# Patient Record
Sex: Female | Born: 1985 | Race: White | Hispanic: No | Smoking: Never smoker
Health system: Southern US, Community
[De-identification: ages and names within clinical notes are randomized; demographics above are authoritative.]

## PROBLEM LIST (undated history)

## (undated) DIAGNOSIS — R0602 Shortness of breath: Secondary | ICD-10-CM

## (undated) DIAGNOSIS — E785 Hyperlipidemia, unspecified: Secondary | ICD-10-CM

## (undated) DIAGNOSIS — J45909 Unspecified asthma, uncomplicated: Secondary | ICD-10-CM

## (undated) DIAGNOSIS — M1990 Unspecified osteoarthritis, unspecified site: Secondary | ICD-10-CM

## (undated) DIAGNOSIS — K219 Gastro-esophageal reflux disease without esophagitis: Secondary | ICD-10-CM

## (undated) DIAGNOSIS — T7840XA Allergy, unspecified, initial encounter: Secondary | ICD-10-CM

## (undated) DIAGNOSIS — IMO0002 Reserved for concepts with insufficient information to code with codable children: Secondary | ICD-10-CM

## (undated) DIAGNOSIS — G709 Myoneural disorder, unspecified: Secondary | ICD-10-CM

## (undated) DIAGNOSIS — E079 Disorder of thyroid, unspecified: Secondary | ICD-10-CM

## (undated) DIAGNOSIS — F329 Major depressive disorder, single episode, unspecified: Secondary | ICD-10-CM

## (undated) DIAGNOSIS — K589 Irritable bowel syndrome without diarrhea: Secondary | ICD-10-CM

## (undated) DIAGNOSIS — M329 Systemic lupus erythematosus, unspecified: Secondary | ICD-10-CM

## (undated) DIAGNOSIS — R569 Unspecified convulsions: Secondary | ICD-10-CM

## (undated) DIAGNOSIS — M810 Age-related osteoporosis without current pathological fracture: Secondary | ICD-10-CM

## (undated) DIAGNOSIS — J42 Unspecified chronic bronchitis: Secondary | ICD-10-CM

## (undated) HISTORY — DX: Age-related osteoporosis without current pathological fracture: M81.0

## (undated) HISTORY — DX: Myoneural disorder, unspecified: G70.9

## (undated) HISTORY — DX: Irritable bowel syndrome without diarrhea: K58.9

## (undated) HISTORY — DX: Reserved for concepts with insufficient information to code with codable children: IMO0002

## (undated) HISTORY — DX: Hyperlipidemia, unspecified: E78.5

## (undated) HISTORY — DX: Allergy, unspecified, initial encounter: T78.40XA

## (undated) HISTORY — DX: Unspecified convulsions: R56.9

## (undated) HISTORY — DX: Major depressive disorder, single episode, unspecified: F32.9

## (undated) HISTORY — PX: RECTAL SURGERY: SHX760

## (undated) HISTORY — DX: Systemic lupus erythematosus, unspecified: M32.9

## (undated) HISTORY — DX: Unspecified osteoarthritis, unspecified site: M19.90

## (undated) HISTORY — PX: COLONOSCOPY: SHX174

## (undated) HISTORY — DX: Disorder of thyroid, unspecified: E07.9

## (undated) HISTORY — DX: Gastro-esophageal reflux disease without esophagitis: K21.9

## (undated) HISTORY — DX: Unspecified chronic bronchitis: J42

## (undated) HISTORY — DX: Shortness of breath: R06.02

## (undated) HISTORY — DX: Unspecified asthma, uncomplicated: J45.909

## (undated) HISTORY — PX: KNEE SURGERY: SHX244

---
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
## 2003-08-07 ENCOUNTER — Emergency Department (HOSPITAL_COMMUNITY): Admission: EM | Admit: 2003-08-07 | Discharge: 2003-08-07 | Payer: Self-pay | Admitting: Emergency Medicine

## 2003-08-07 ENCOUNTER — Encounter: Payer: Self-pay | Admitting: Emergency Medicine

## 2003-08-24 ENCOUNTER — Other Ambulatory Visit: Admission: RE | Admit: 2003-08-24 | Discharge: 2003-08-24 | Payer: Self-pay | Admitting: Family Medicine

## 2004-08-21 ENCOUNTER — Other Ambulatory Visit: Admission: RE | Admit: 2004-08-21 | Discharge: 2004-08-21 | Payer: Self-pay | Admitting: Obstetrics and Gynecology

## 2005-09-05 ENCOUNTER — Other Ambulatory Visit: Admission: RE | Admit: 2005-09-05 | Discharge: 2005-09-05 | Payer: Self-pay | Admitting: Obstetrics and Gynecology

## 2006-11-13 ENCOUNTER — Other Ambulatory Visit: Admission: RE | Admit: 2006-11-13 | Discharge: 2006-11-13 | Payer: Self-pay | Admitting: Obstetrics & Gynecology

## 2006-12-01 ENCOUNTER — Encounter: Admission: RE | Admit: 2006-12-01 | Discharge: 2006-12-01 | Payer: Self-pay | Admitting: Gastroenterology

## 2006-12-14 ENCOUNTER — Ambulatory Visit (HOSPITAL_COMMUNITY): Admission: RE | Admit: 2006-12-14 | Discharge: 2006-12-14 | Payer: Self-pay | Admitting: Gastroenterology

## 2006-12-14 ENCOUNTER — Encounter (INDEPENDENT_AMBULATORY_CARE_PROVIDER_SITE_OTHER): Payer: Self-pay | Admitting: Specialist

## 2007-11-16 ENCOUNTER — Other Ambulatory Visit: Admission: RE | Admit: 2007-11-16 | Discharge: 2007-11-16 | Payer: Self-pay | Admitting: Obstetrics and Gynecology

## 2008-07-14 ENCOUNTER — Emergency Department (HOSPITAL_COMMUNITY): Admission: EM | Admit: 2008-07-14 | Discharge: 2008-07-14 | Payer: Self-pay | Admitting: Emergency Medicine

## 2008-12-14 ENCOUNTER — Other Ambulatory Visit: Admission: RE | Admit: 2008-12-14 | Discharge: 2008-12-14 | Payer: Self-pay | Admitting: Obstetrics and Gynecology

## 2009-11-25 ENCOUNTER — Emergency Department (HOSPITAL_COMMUNITY): Admission: EM | Admit: 2009-11-25 | Discharge: 2009-11-25 | Payer: Self-pay | Admitting: Emergency Medicine

## 2009-12-17 ENCOUNTER — Encounter: Admission: RE | Admit: 2009-12-17 | Discharge: 2009-12-17 | Payer: Self-pay | Admitting: Surgery

## 2010-01-18 ENCOUNTER — Ambulatory Visit (HOSPITAL_COMMUNITY): Admission: RE | Admit: 2010-01-18 | Discharge: 2010-01-19 | Payer: Self-pay | Admitting: Surgery

## 2011-02-02 LAB — URINALYSIS, ROUTINE W REFLEX MICROSCOPIC
Bilirubin Urine: NEGATIVE
Glucose, UA: NEGATIVE mg/dL
Hgb urine dipstick: NEGATIVE
Ketones, ur: NEGATIVE mg/dL
Nitrite: NEGATIVE
Protein, ur: NEGATIVE mg/dL
Specific Gravity, Urine: 1.013 (ref 1.005–1.030)
Urobilinogen, UA: 0.2 mg/dL (ref 0.0–1.0)
pH: 6 (ref 5.0–8.0)

## 2011-02-02 LAB — COMPREHENSIVE METABOLIC PANEL
ALT: 19 U/L (ref 0–35)
Alkaline Phosphatase: 91 U/L (ref 39–117)
BUN: 6 mg/dL (ref 6–23)
CO2: 25 mEq/L (ref 19–32)
Chloride: 104 mEq/L (ref 96–112)
Glucose, Bld: 92 mg/dL (ref 70–99)
Potassium: 3.4 mEq/L — ABNORMAL LOW (ref 3.5–5.1)
Sodium: 136 mEq/L (ref 135–145)
Total Bilirubin: 0.4 mg/dL (ref 0.3–1.2)
Total Protein: 6.7 g/dL (ref 6.0–8.3)

## 2011-02-02 LAB — DIFFERENTIAL
Basophils Absolute: 0.1 10*3/uL (ref 0.0–0.1)
Basophils Relative: 1 % (ref 0–1)
Eosinophils Absolute: 0.1 10*3/uL (ref 0.0–0.7)
Monocytes Relative: 7 % (ref 3–12)
Neutro Abs: 6.6 10*3/uL (ref 1.7–7.7)
Neutrophils Relative %: 56 % (ref 43–77)

## 2011-02-02 LAB — POCT PREGNANCY, URINE: Preg Test, Ur: NEGATIVE

## 2011-02-02 LAB — CBC
HCT: 38.9 % (ref 36.0–46.0)
Hemoglobin: 13.1 g/dL (ref 12.0–15.0)
RBC: 4.34 MIL/uL (ref 3.87–5.11)
RDW: 12.9 % (ref 11.5–15.5)
WBC: 11.6 10*3/uL — ABNORMAL HIGH (ref 4.0–10.5)

## 2011-02-02 LAB — HEMOCCULT GUIAC POC 1CARD (OFFICE): Fecal Occult Bld: NEGATIVE

## 2011-02-10 LAB — PREGNANCY, URINE: Preg Test, Ur: NEGATIVE

## 2011-04-04 NOTE — Op Note (Signed)
NAME:  Autumn Davis, SECKINGER               ACCOUNT NO.:  [id number]   MEDICAL RECORD NO.:  [id number]          PATIENT TYPE:  AMB   LOCATION:  ENDO                         FACILITY:  [facility]   PHYSICIAN:  Anselmo Rod, M.D.  DATE OF BIRTH:  05-Dec-1985   DATE OF PROCEDURE:  12/14/2006  DATE OF DISCHARGE:                               OPERATIVE REPORT   PROCEDURE PERFORMED:  Colonoscopy with snare polypectomy x1.   ENDOSCOPIST:  Anselmo Rod, M.D.   INSTRUMENT USED:  Pentax video colonoscope.   INDICATIONS FOR PROCEDURE:  A 25 year old female with a history of  rectal bleeding and mucoid stool, family history of ulcerative colitis.  The patient has a history of change in bowel habits recently.  Rule out  inflammatory bowel disease.   PREPROCEDURE PREPARATION:  Informed consent was procured from the  patient.  The patient was fasted for eight hours prior to the procedure  and prepped with a gallon of Trilyte the night prior to the procedure.  The risks and benefits of the procedure including a 10% miss rate for  cancer or polyps was discussed with the patient as well.   PREPROCEDURE PHYSICAL:  The patient had stable vital signs.  Neck  supple.  Chest clear to auscultation.  S1 and S2 regular.  Abdomen soft  with normal bowel sounds.   DESCRIPTION OF PROCEDURE:  The patient was placed in left lateral  decubitus position and sedated with 75 mcg of fentanyl and 7.5 mg of  Versed given intravenously in slow incremental doses.  Once the patient  was adequately sedated and maintained on low flow oxygen and continuous  cardiac monitoring, the Olympus video colonoscope was advanced from the  rectum.  The appendicular orifice and ileocecal valve were clearly  visualized and photographed.  The entire colonic mucosa appeared healthy  with normal vascular pattern.  Prominent lymphoid follicles were noted  on intubation in the terminal ileum.  Retroflexion in the rectum  revealed small internal  hemorrhoids.  The patient tolerated the  procedure well without complication.   IMPRESSION:  1. Small nonbleeding internal hemorrhoids.  2. Small sessile polyp removed by cold snare from the rectum.  3. Otherwise normal exam up to the terminal ileum.   RECOMMENDATIONS:  1. Await pathology results.  2. Avoid all nonsteroidals including aspirin for now.  3. Continue high fiber diet with liberal fluid intake.  4. Outpatient followup in the next two weeks for further      recommendations.      Anselmo Rod, M.D.  Electronically Signed     JNM/MEDQ  D:  12/14/2006  T:  12/14/2006  Job:  [id number]   cc:   Edwena Felty. Romine, M.D.

## 2011-12-18 ENCOUNTER — Emergency Department (HOSPITAL_COMMUNITY): Payer: BC Managed Care – PPO

## 2011-12-18 ENCOUNTER — Emergency Department (HOSPITAL_COMMUNITY)
Admission: EM | Admit: 2011-12-18 | Discharge: 2011-12-18 | Disposition: A | Discharge status: Home / Self Care | Payer: BC Managed Care – PPO | Attending: Emergency Medicine | Admitting: Emergency Medicine

## 2011-12-18 DIAGNOSIS — R10819 Abdominal tenderness, unspecified site: Secondary | ICD-10-CM | POA: Insufficient documentation

## 2011-12-18 DIAGNOSIS — G8929 Other chronic pain: Secondary | ICD-10-CM | POA: Insufficient documentation

## 2011-12-18 DIAGNOSIS — R109 Unspecified abdominal pain: Secondary | ICD-10-CM | POA: Insufficient documentation

## 2011-12-18 DIAGNOSIS — R1900 Intra-abdominal and pelvic swelling, mass and lump, unspecified site: Secondary | ICD-10-CM | POA: Insufficient documentation

## 2011-12-18 LAB — COMPREHENSIVE METABOLIC PANEL
ALT: 15 U/L (ref 0–35)
AST: 15 U/L (ref 0–37)
Albumin: 3.8 g/dL (ref 3.5–5.2)
Calcium: 9.5 mg/dL (ref 8.4–10.5)
Creatinine, Ser: 0.82 mg/dL (ref 0.50–1.10)
Sodium: 140 mEq/L (ref 135–145)

## 2011-12-18 LAB — WET PREP, GENITAL
Trich, Wet Prep: NONE SEEN
Yeast Wet Prep HPF POC: NONE SEEN

## 2011-12-18 LAB — DIFFERENTIAL
Basophils Absolute: 0.1 10*3/uL (ref 0.0–0.1)
Basophils Relative: 2 % — ABNORMAL HIGH (ref 0–1)
Eosinophils Relative: 1 % (ref 0–5)
Monocytes Absolute: 0.6 10*3/uL (ref 0.1–1.0)
Neutro Abs: 3.7 10*3/uL (ref 1.7–7.7)

## 2011-12-18 LAB — CBC
HCT: 37 % (ref 36.0–46.0)
MCHC: 33.8 g/dL (ref 30.0–36.0)
MCV: 89.4 fL (ref 78.0–100.0)
Platelets: 365 10*3/uL (ref 150–400)
RDW: 13 % (ref 11.5–15.5)

## 2011-12-18 LAB — URINALYSIS, ROUTINE W REFLEX MICROSCOPIC
Glucose, UA: NEGATIVE mg/dL
Leukocytes, UA: NEGATIVE
Specific Gravity, Urine: 1.016 (ref 1.005–1.030)
pH: 7 (ref 5.0–8.0)

## 2011-12-18 LAB — OCCULT BLOOD, POC DEVICE: Fecal Occult Bld: NEGATIVE

## 2011-12-18 MED ORDER — HYDROCODONE-ACETAMINOPHEN 5-325 MG PO TABS: 1.0000 | ORAL_TABLET | ORAL | Status: AC | PRN

## 2011-12-18 NOTE — ED Notes (Signed)
Ambulated to the BR with steady gait

## 2011-12-18 NOTE — ED Provider Notes (Signed)
History     CSN: [id number]  Arrival date & time 12/18/11  4098   First MD Initiated Contact with Patient 12/18/11 1001      Chief Complaint  Patient presents with  . Abdominal Pain    abdominal bloating    (Consider location/radiation/quality/duration/timing/severity/associated sxs/prior treatment) HPI History provided by pt.   Pt has had abdominal pain w/ radiation to the back and gradually worsening bloating for greater than one month.   No associated fever, N/V or GU sx w/ exception of increased urinary frequency.  Has had intermittent diarrhea but has h/o IBS and these sx are unchanged.  Also has h/o rectal prolapse and colon polyps; most recent colonoscopy 2 yrs ago. Has noticed blood on tp when she wipes recently.  Pt has been evaluated by her PCP for these complaints and labs showed a cholesterol abnormality that could be causing her to retain fluid.  Was started on Crestor and referred to ER for CT.  Seen at [facility] ER last Thursday, chart has been reviewed and she had a neg CT abd/pelvis.  Followed up with Dr. Loreta Ave (GI), told that sx were not likely to be GI and was referred to Neurology.  The neurologist does not believe her symptoms are neurologic.  Pt now has edema in her hands. Had to remove her rings.  Eats a well rounded diet that includes protein.   No past medical history on file.  No past surgical history on file.  No family history on file.  History  Substance Use Topics  . Smoking status: Not on file  . Smokeless tobacco: Not on file  . Alcohol Use: Not on file    OB History    No data available      Review of Systems  All other systems reviewed and are negative.    Allergies  Compazine  Home Medications   Current Outpatient Rx  Name Route Sig Dispense Refill  . FENOFIBRATE MICRONIZED 134 MG PO CAPS Oral Take 134 mg by mouth daily before breakfast.    . LEVONORGESTREL-ETHINYL ESTRAD 0.1-20 MG-MCG PO TABS Oral Take 1 tablet by mouth daily.    Marland Kitchen  MONTELUKAST SODIUM 10 MG PO TABS Oral Take 10 mg by mouth at bedtime.    Marland Kitchen RIFAXIMIN 550 MG PO TABS Oral Take 550 mg by mouth 2 (two) times daily.    Marland Kitchen ROSUVASTATIN CALCIUM 10 MG PO TABS Oral Take 10 mg by mouth daily.    Marland Kitchen SACCHAROMYCES BOULARDII 250 MG PO CAPS Oral Take 500 mg by mouth 2 (two) times daily.    . SERTRALINE HCL 100 MG PO TABS Oral Take 100 mg by mouth daily.    . TOPIRAMATE 200 MG PO TABS Oral Take 200 mg by mouth daily.      BP 110/74  Pulse 77  Temp(Src) 98.8 F (37.1 C) (Oral)  Resp 16  Ht 5\' 9"  (1.753 m)  Wt 168 lb (76.204 kg)  BMI 24.81 kg/m2  SpO2 100%  LMP 11/27/2011  Physical Exam  Nursing note and vitals reviewed. Constitutional: She is oriented to person, place, and time. She appears well-developed and well-nourished. No distress.  HENT:  Head: Normocephalic and atraumatic.  Eyes:       Normal appearance  Neck: Normal range of motion.  Cardiovascular: Normal rate and regular rhythm.   Pulmonary/Chest: Effort normal and breath sounds normal.  Abdominal: Soft. Bowel sounds are normal. She exhibits no mass. There is no rebound and no guarding.  Abd distended but soft.  Diffuse, mild ttp.  No CVA tenderness.  Genitourinary:       Nml external genitalia.  No vaginal discharge/bleeding.  Cervix closed and appears nml. No cervical motion tenderess.  Bilateral adnexal tenderness.  Nml rectal tone.  No fissures or external hemorrhoids.  Nml stool color and no gross blood in rectum.  Non-tender.    Musculoskeletal:       No obvious edema of extremities but pt reports that her hands are more swollen than normal.  Neurological: She is alert and oriented to person, place, and time.  Skin: Skin is warm and dry. No rash noted.  Psychiatric: She has a normal mood and affect. Her behavior is normal.    ED Course  Procedures (including critical care time)  Labs Reviewed  DIFFERENTIAL - Abnormal; Notable for the following:    Basophils Relative 2 (*)    All  other components within normal limits  WET PREP, GENITAL - Abnormal; Notable for the following:    Clue Cells Wet Prep HPF POC FEW (*)    WBC, Wet Prep HPF POC RARE (*)    All other components within normal limits  CBC  COMPREHENSIVE METABOLIC PANEL  LIPASE, BLOOD  URINALYSIS, ROUTINE W REFLEX MICROSCOPIC  POCT PREGNANCY, URINE  OCCULT BLOOD, POC DEVICE  GC/CHLAMYDIA PROBE AMP, GENITAL  OCCULT BLOOD X 1 CARD TO LAB, STOOL   US Transvaginal Non-ob  12/18/2011  *RADIOLOGY REPORT*  Clinical Data: Abdominal swelling and pain.  TRANSABDOMINAL AND TRANSVAGINAL ULTRASOUND OF PELVIS Technique:  Both transabdominal and transvaginal ultrasound examinations of the pelvis were performed. Transabdominal technique was performed for global imaging of the pelvis including uterus, ovaries, adnexal regions, and pelvic cul-de-sac.  Comparison: None.   It was necessary to proceed with endovaginal exam following the transabdominal exam to visualize the uterus, ovaries, and adnexa  .  Findings:  Uterus: Normal in size and appearance  Endometrium: Normal, 8 mm.  Right ovary:  1.7 x 1.4 x 1.2 cm. Normal in morphology.  Left ovary: 3.5 x 2.5 x 2.1 cm.  Within the left ovary is a primarily simple appearing cystic lesion.  This measures 2.5 x 2.4 x 2.3 cm.  Along its inferior wall is an area of complexity.  This measures approximately 7 mm on image 54.  Color Doppler evaluation was not performed.  Other findings: No free fluid  IMPRESSION: Left ovarian cystic lesion with minimal complexity/irregularity in its wall.  Most likely a complex dominant follicle/cyst.  Consider ultrasound follow-up at 6-12 weeks, in the week following the patient's menses, to confirm resolution. This recommendation follows the consensus statement:  Management of Asymptomatic Ovarian and Other Adnexal Cysts Imaged at [place]:  Society of Radiologists in Ultrasound Consensus Conference Statement. Radiology 2010; [phone].  Available online at:  [email or web address].[email or web address]  Original Report Authenticated By: Consuello Bossier, M.D.   US Pelvis Complete  12/18/2011  *RADIOLOGY REPORT*  Clinical Data: Abdominal swelling and pain.  TRANSABDOMINAL AND TRANSVAGINAL ULTRASOUND OF PELVIS Technique:  Both transabdominal and transvaginal ultrasound examinations of the pelvis were performed. Transabdominal technique was performed for global imaging of the pelvis including uterus, ovaries, adnexal regions, and pelvic cul-de-sac.  Comparison: None.   It was necessary to proceed with endovaginal exam following the transabdominal exam to visualize the uterus, ovaries, and adnexa  .  Findings:  Uterus: Normal in size and appearance  Endometrium: Normal, 8 mm.  Right ovary:  1.7 x 1.4 x 1.2 cm. Normal in morphology.  Left ovary: 3.5 x 2.5 x 2.1 cm.  Within the left ovary is a primarily simple appearing cystic lesion.  This measures 2.5 x 2.4 x 2.3 cm.  Along its inferior wall is an area of complexity.  This measures approximately 7 mm on image 54.  Color Doppler evaluation was not performed.  Other findings: No free fluid  IMPRESSION: Left ovarian cystic lesion with minimal complexity/irregularity in its wall.  Most likely a complex dominant follicle/cyst.  Consider ultrasound follow-up at 6-12 weeks, in the week following the patient's menses, to confirm resolution. This recommendation follows the consensus statement:  Management of Asymptomatic Ovarian and Other Adnexal Cysts Imaged at [place]:  Society of Radiologists in Ultrasound Consensus Conference Statement. Radiology 2010; [phone].  Available online at: [email or web address].[email or web address]/.  Original Report Authenticated By: Consuello Bossier, M.D.     1. Chronic abdominal pain       MDM  Pt presents w/ abdominal pain and distension x >48month as well as hand edema and rectal bleeding.  Has been evaluated by PCP, ER at [facility], GI and neuro and etiology has not been  determined.  Was found to have a cholesterol abnormality and started on Crestor early this week.  CT abd/pelvis negative 12/10/12.  On exam, pt afebrile, NAD, abd distended, soft, diffusely, mildly ttp, no obvious edema of extremities, pelvic exam unremarkable w/ exception of reported bilateral adnexal tenderness, nml rectum. Labs pending.    Labs unremarkable.  Transvaginal US obtained to look for signs of ovarian cancer.  There is a mildly complex and irregular L ovarian cyst.  Results discussed w/ pt.  Recommended close GYN f/u.  She has scheduled an appt from the ED and will be seen this afternoon.  Also recommended that she see her GI specialist if she continues to notice blood in her stool (hemoccult neg today) and with her PCP to have labs followed.  D/c'd home w/ short course of vicodin for pain.  Return precautions discussed.         Arie Sabina [place], [place] 12/18/11 [phone]

## 2011-12-18 NOTE — ED Notes (Signed)
Patient transported to Ultrasound 

## 2011-12-18 NOTE — ED Provider Notes (Signed)
Medical screening examination/treatment/procedure(s) were performed by non-physician practitioner and as supervising physician I was immediately available for consultation/collaboration.   Dayton Bailiff, MD 12/18/11 1415

## 2011-12-18 NOTE — ED Notes (Signed)
[id number]<AV> Expected date:<BR> Expected time:<BR> Means of arrival:<BR> Comments:<BR> Hold for holcomb

## 2011-12-18 NOTE — ED Notes (Signed)
Pt states abdominal bloating for 2 weeks-gained 20 lbs-numbness/tingling of hands/feet

## 2011-12-31 ENCOUNTER — Telehealth (INDEPENDENT_AMBULATORY_CARE_PROVIDER_SITE_OTHER): Payer: Self-pay

## 2011-12-31 NOTE — Telephone Encounter (Signed)
The patient called and requested a soon appointment for possible adhesions s/p rectal prolapse surgery.  She has had thorough workup which I told her we would need the reports.  Please call her to work in sooner that 3/7.  She states she can't work due to constant pain.

## 2012-01-02 NOTE — Telephone Encounter (Signed)
Get on bowel regimen  CT enterrhography if having obstructive Sx.  See if pt has seen her GI MD given her h/o IBS  D/w A. Spillers.

## 2012-01-07 ENCOUNTER — Telehealth (INDEPENDENT_AMBULATORY_CARE_PROVIDER_SITE_OTHER): Payer: Self-pay

## 2012-01-07 NOTE — Telephone Encounter (Signed)
Called pt to notify her that I did receive the message about trying to move her appt up earlier than 01-22-12 but I did explain that I do not have anything at this moment to move her time up. If I do get another clinic with space I will call her back to move the appt. The pt understands.

## 2012-01-22 ENCOUNTER — Encounter (INDEPENDENT_AMBULATORY_CARE_PROVIDER_SITE_OTHER): Payer: Self-pay | Admitting: Surgery

## 2012-01-22 ENCOUNTER — Ambulatory Visit (INDEPENDENT_AMBULATORY_CARE_PROVIDER_SITE_OTHER): Payer: BC Managed Care – PPO | Admitting: Surgery

## 2012-01-22 VITALS — BP 120/76 | HR 98 | Temp 98.0°F | Ht 69.0 in | Wt 170.6 lb

## 2012-01-22 DIAGNOSIS — F32A Depression, unspecified: Secondary | ICD-10-CM

## 2012-01-22 DIAGNOSIS — M545 Low back pain, unspecified: Secondary | ICD-10-CM | POA: Insufficient documentation

## 2012-01-22 DIAGNOSIS — K589 Irritable bowel syndrome without diarrhea: Secondary | ICD-10-CM

## 2012-01-22 DIAGNOSIS — R1084 Generalized abdominal pain: Secondary | ICD-10-CM | POA: Insufficient documentation

## 2012-01-22 DIAGNOSIS — J42 Unspecified chronic bronchitis: Secondary | ICD-10-CM

## 2012-01-22 DIAGNOSIS — K219 Gastro-esophageal reflux disease without esophagitis: Secondary | ICD-10-CM | POA: Insufficient documentation

## 2012-01-22 DIAGNOSIS — F329 Major depressive disorder, single episode, unspecified: Secondary | ICD-10-CM | POA: Insufficient documentation

## 2012-01-22 DIAGNOSIS — E079 Disorder of thyroid, unspecified: Secondary | ICD-10-CM | POA: Insufficient documentation

## 2012-01-22 DIAGNOSIS — R569 Unspecified convulsions: Secondary | ICD-10-CM | POA: Insufficient documentation

## 2012-01-22 HISTORY — DX: Depression, unspecified: F32.A

## 2012-01-22 HISTORY — DX: Unspecified chronic bronchitis: J42

## 2012-01-22 HISTORY — DX: Irritable bowel syndrome, unspecified: K58.9

## 2012-01-22 NOTE — Patient Instructions (Signed)
Managing Pain  Pain after surgery or related to activity is often due to strain/injury to muscle, tendon, nerves and/or incisions.  This pain is usually short-term and will improve in a few months.   Many people find it helpful to do the following things TOGETHER to help speed the process of healing and to get back to regular activity more quickly:  1. Avoid heavy physical activity a.  no lifting greater than 20 pounds b. Do not "push through" the pain.  Listen to your body and avoid positions and maneuvers than reproduce the pain c. Walking is okay as tolerated, but go slowly and stop when getting sore.  d. Remember: If it hurts to do it, then don't do it! 2. Take Anti-inflammatory medication  a. Take with food/snack around the clock for 1-2 weeks i. This helps the muscle and nerve tissues become less irritable and calm down faster b. Choose ONE of the following over-the-counter medications: i. Start with Acetaminophen 500mg  tabs (Tylenol) 2 pills with every meal and just before bedtime (8 pills a day total) ii. Naproxen 220mg  tabs (ex. Aleve) 1-2 pills twice a day (use instead if tylenol not working well after a 2week trial.  Use NSAIDs for a short time only & take PPI antacid (omeprazole = Prilosec)) 3. Use a Heating pad or Ice/Cold Pack a. 4-6 times a day b. May use warm bath/hottub  or showers 4. Try Gentle Massage and/or Stretching  a. at the area of pain many times a day b. stop if you feel pain - do not overdo it  Try these steps together to help you body heal faster and avoid making things get worse.  Doing just one of these things may not be enough.    If you are not getting better after 2-3 weeks or are noticing you are getting worse, contact our office for further advice; we may need to re-evaluate you & see what other things we can do to help.  Muscle Strain A muscle strain, or pulled muscle, occurs when a muscle is over-stretched. A small number of muscle fibers may also be  torn. This is especially common in athletes. This happens when a sudden violent force placed on a muscle pushes it past its capacity. Usually, recovery from a pulled muscle takes 1 to 2 weeks. But complete healing will take 5 to 6 weeks. There are millions of muscle fibers. Following injury, your body will usually return to normal quickly. HOME CARE INSTRUCTIONS   While awake, apply ice to the sore muscle for 15 to 20 minutes each hour for the first 2 days. Put ice in a plastic bag and place a towel between the bag of ice and your skin.   Do not use the pulled muscle for several days. Do not use the muscle if you have pain.   You may wrap the injured area with an elastic bandage for comfort. Be careful not to bind it too tightly. This may interfere with blood circulation.   Only take over-the-counter or prescription medicines for pain, discomfort, or fever as directed by your caregiver. Do not use aspirin as this will increase bleeding (bruising) at injury site.   Warming up before exercise helps prevent muscle strains.  SEEK MEDICAL CARE IF:  There is increased pain or swelling in the affected area. MAKE SURE YOU:   Understand these instructions.   Will watch your condition.   Will get help right away if you are not doing well or get  worse.  Document Released: 11/03/2005 Document Revised: 10/23/2011 Document Reviewed: 06/02/2007 [facility] Patient Information 2012 [place], [place].

## 2012-01-22 NOTE — Progress Notes (Signed)
Subjective:     Patient ID: Autumn Davis, female   DOB: Aug 03, 1986, 26 y.o.   MRN: [id number]  HPI  Autumn Davis  1986/03/10 [id number]  Patient Care Team: [name], FNP as PCP - General (Nurse Practitioner) Charna Elizabeth, MD as Attending Physician (Gastroenterology)  This patient is a 26 y.o.female who presents today for surgical evaluation.  Reason for visit: Abdominal pain. Question adhesions as cause  Patient is a pleasant female with irritable bowel and psych issues. She had a moderate rectal prolapse. Repair this laparoscopically a few years ago. I did not need a colon resection. Since that time, she has been diligent on a good bowel regimen under the care of Dr. Loreta Ave. Her constipation is under much better control. She has about 3-4 bowel movements a day. Fourth.  About 2 months ago, she noted some flank pain when she lied down in bed. It began to radiate to her abdomen. Occasional back pain as well. Occasionally had some lightheadedness and dizziness. Had a recurrent seizure one time but that is better.  The pain seems to be worse at night. She will feel worse after she's been active doing chores. Worse if she has a cough. She had a CAT scan which was normal. She had plain film - showed a moderate stool burden. She had pelvic ultrasound which showed a small left adnexal cystic mass most like be consistent with an ovarian cyst. She notes heat and baths and showers help.  She's not noticed any worsening of symptoms with eating. No problems with spicy or greasy foods. No severe diarrhea or severe bloating after eating. No recurrent symptoms with eating. Energy level down. She has gained about 10 pounds. She has some increased urination but no burning or dysuria. Not really had this before.  She comes today with her mother. No sick contacts. No travel history. No rectal bleeding or melena. She saw gastroenterology. They have been trying to adjust the IBS regimen  without much success. She is back to her usual regimen. Again her bowels are better than they have been a long time.  Patient Active Problem List  Diagnoses  . GERD (gastroesophageal reflux disease)  . Seizures  . Thyroid disease  . IBS (irritable bowel syndrome)  . Depression  . Bronchitis, chronic/intermittent  . Abdominal pain, generalized  . Back pain, lumbosacral    Past Medical History  Diagnosis Date  . GERD (gastroesophageal reflux disease)   . Hyperlipidemia   . Seizures   . Thyroid disease   . Bronchitis, chronic/intermittent 01/22/2012  . Depression 01/22/2012  . IBS (irritable bowel syndrome) 01/22/2012    Past Surgical History  Procedure Date  . Knee surgery 2002/2003    bil    History   Social History  . Marital Status: Married    Spouse Name: N/A    Number of Children: N/A  . Years of Education: N/A   Occupational History  . Not on file.   Social History Main Topics  . Smoking status: Never Smoker   . Smokeless tobacco: Not on file  . Alcohol Use: No  . Drug Use: No  . Sexually Active:    Other Topics Concern  . Not on file   Social History Narrative  . No narrative on file    Family History  Problem Relation Age of Onset  . Thyroid disease Mother   . Cancer Maternal Grandmother     lung  . Cancer Paternal Grandmother  colon/pancreatiec/lymphoma    Current outpatient prescriptions:divalproex (DEPAKOTE) 500 MG DR tablet, Take 500 mg by mouth daily., Disp: , Rfl: ;  fenofibrate micronized (LOFIBRA) 134 MG capsule, Take 134 mg by mouth daily before breakfast., Disp: , Rfl: ;  levonorgestrel-ethinyl estradiol (LUTERA) 0.1-20 MG-MCG tablet, Take 1 tablet by mouth daily., Disp: , Rfl: ;  montelukast (SINGULAIR) 10 MG tablet, Take 10 mg by mouth at bedtime., Disp: , Rfl:  rosuvastatin (CRESTOR) 10 MG tablet, Take 10 mg by mouth daily., Disp: , Rfl: ;  saccharomyces boulardii (FLORASTOR) 250 MG capsule, Take 250 mg by mouth daily., Disp: , Rfl: ;   sertraline (ZOLOFT) 100 MG tablet, Take 100 mg by mouth daily., Disp: , Rfl: ;  topiramate (TOPAMAX) 200 MG tablet, Take 200 mg by mouth daily., Disp: , Rfl:   Allergies  Allergen Reactions  . Compazine Other (See Comments)    hallucinations    BP 120/76  Pulse 98  Temp(Src) 98 F (36.7 C) (Temporal)  Ht 5\' 9"  (1.753 m)  Wt 170 lb 9.6 oz (77.384 kg)  BMI 25.19 kg/m2  SpO2 99%     Review of Systems  Constitutional: Negative for fever, chills, diaphoresis, appetite change and fatigue.  HENT: Negative for ear pain, sore throat, trouble swallowing, neck pain and ear discharge.   Eyes: Negative for photophobia, discharge and visual disturbance.  Respiratory: Negative for cough, choking, chest tightness and shortness of breath.   Cardiovascular: Negative for chest pain and palpitations.  Gastrointestinal: Positive for abdominal pain and abdominal distention. Negative for nausea, vomiting, diarrhea, constipation, blood in stool, anal bleeding and rectal pain.  Genitourinary: Positive for frequency, flank pain and pelvic pain. Negative for dysuria, urgency, hematuria, decreased urine volume, vaginal bleeding, vaginal discharge, enuresis, difficulty urinating, vaginal pain and menstrual problem.  Musculoskeletal: Positive for back pain. Negative for myalgias, joint swelling, arthralgias and gait problem.  Skin: Negative for color change, pallor and rash.  Neurological: Positive for seizures. Negative for dizziness, tremors, syncope, speech difficulty, weakness, numbness and headaches.  Hematological: Negative for adenopathy.  Psychiatric/Behavioral: Negative for confusion and agitation. The patient is not nervous/anxious.        Objective:   Physical Exam  Constitutional: She is oriented to person, place, and time. She appears well-developed and well-nourished. No distress.       Moon face  HENT:  Head: Normocephalic.  Mouth/Throat: Oropharynx is clear and moist. No oropharyngeal  exudate.  Eyes: Conjunctivae and EOM are normal. Pupils are equal, round, and reactive to light. No scleral icterus.  Neck: Normal range of motion. Neck supple. No tracheal deviation present.  Cardiovascular: Normal rate, regular rhythm and intact distal pulses.   Pulmonary/Chest: Effort normal and breath sounds normal. No respiratory distress. She exhibits no tenderness.  Abdominal: Soft. She exhibits no mass. There is no rebound and no guarding. Hernia confirmed negative in the right inguinal area and confirmed negative in the left inguinal area.       Obese but soft.  No real tenderness to exam today.  Easily distracted with talking  Genitourinary: No vaginal discharge found.  Musculoskeletal: Normal range of motion. She exhibits no edema.       Lumbar back: She exhibits tenderness. She exhibits normal range of motion, no bony tenderness, no swelling, no edema, no deformity and no spasm.  Lymphadenopathy:    She has no cervical adenopathy.       Right: No inguinal adenopathy present.       Left: No inguinal  adenopathy present.  Neurological: She is alert and oriented to person, place, and time. No cranial nerve deficit. She exhibits normal muscle tone. Coordination normal.  Skin: Skin is warm and dry. No rash noted. She is not diaphoretic. No erythema.  Psychiatric: She has a normal mood and affect. Her behavior is normal. Judgment and thought content normal.       Assessment:     Bilateral flank/back & now diffuse abd discomfort, seems MSkeletal in nature    Plan:     Given the fact that her symptoms were not reproduced his eating, has good appetite, no diarrhea or severe bloating with eating; I do not think that this is a GI/adhesion issue.  I suspect mostly musculoskeletal. I think she would benefit from a trial of anti-inflammation. Normal I would use Aleve b.i.d. and heat 6 times a day. With her sensitivity issues and Dr. Loreta Ave is hesitant to have her try nonsteroidal, we'll  therefore do Tylenol q.i.d. If that does not pertinent a short course of Aleve for 2-3 weeks.  I would like to physically see the CAT scan films to see if there is any abnormalities  Followup of a left adnexal cystic mass by U/S per PCP. Consider a gynecological evaluation if persists or worsens.  Already on OCPs  She really did not have any adhesions on my case and I did it it laparoscopically. There is no transition point noted on CAT scan. If anything, she was full of stool. Perhaps a more aggressive bowel regimen be helpful. However she is having regular bowel movements and that is much better than it was when I saw her 2 years ago so I hesitate to rock the boat on that.  She and her mother feel reassured.  I asked him to call me once a drop of films to get further feedback.  There is no need for surgery at this time

## 2012-01-24 ENCOUNTER — Encounter (HOSPITAL_COMMUNITY): Payer: Self-pay | Admitting: Emergency Medicine

## 2012-01-24 ENCOUNTER — Emergency Department (HOSPITAL_COMMUNITY)
Admission: EM | Admit: 2012-01-24 | Discharge: 2012-01-25 | Disposition: A | Discharge status: Home / Self Care | Payer: BC Managed Care – PPO | Attending: Emergency Medicine | Admitting: Emergency Medicine

## 2012-01-24 DIAGNOSIS — E876 Hypokalemia: Secondary | ICD-10-CM | POA: Insufficient documentation

## 2012-01-24 DIAGNOSIS — Z79899 Other long term (current) drug therapy: Secondary | ICD-10-CM | POA: Insufficient documentation

## 2012-01-24 DIAGNOSIS — G40909 Epilepsy, unspecified, not intractable, without status epilepticus: Secondary | ICD-10-CM | POA: Insufficient documentation

## 2012-01-24 DIAGNOSIS — R5381 Other malaise: Secondary | ICD-10-CM | POA: Insufficient documentation

## 2012-01-24 DIAGNOSIS — F329 Major depressive disorder, single episode, unspecified: Secondary | ICD-10-CM | POA: Insufficient documentation

## 2012-01-24 DIAGNOSIS — K219 Gastro-esophageal reflux disease without esophagitis: Secondary | ICD-10-CM | POA: Insufficient documentation

## 2012-01-24 DIAGNOSIS — K589 Irritable bowel syndrome without diarrhea: Secondary | ICD-10-CM | POA: Insufficient documentation

## 2012-01-24 DIAGNOSIS — R569 Unspecified convulsions: Secondary | ICD-10-CM

## 2012-01-24 DIAGNOSIS — F3289 Other specified depressive episodes: Secondary | ICD-10-CM | POA: Insufficient documentation

## 2012-01-24 DIAGNOSIS — E785 Hyperlipidemia, unspecified: Secondary | ICD-10-CM | POA: Insufficient documentation

## 2012-01-24 LAB — DIFFERENTIAL
Basophils Absolute: 0.1 10*3/uL (ref 0.0–0.1)
Eosinophils Relative: 1 % (ref 0–5)
Lymphocytes Relative: 52 % — ABNORMAL HIGH (ref 12–46)
Lymphs Abs: 5 10*3/uL — ABNORMAL HIGH (ref 0.7–4.0)
Neutro Abs: 3.7 10*3/uL (ref 1.7–7.7)
Neutrophils Relative %: 39 % — ABNORMAL LOW (ref 43–77)

## 2012-01-24 LAB — CBC
MCV: 90.4 fL (ref 78.0–100.0)
Platelets: 356 10*3/uL (ref 150–400)
RBC: 4.16 MIL/uL (ref 3.87–5.11)
RDW: 12.7 % (ref 11.5–15.5)
WBC: 9.6 10*3/uL (ref 4.0–10.5)

## 2012-01-24 NOTE — ED Notes (Signed)
MOTHER REPORTS GRAND MAL SEIZURE EPISODE AT 6 PM THIS EVENING , DENIES INJURY OR PAIN , STATES FEELS "TIRED AND DRAINED" , RESPIRATIONS UNLABORED.

## 2012-01-25 ENCOUNTER — Encounter (HOSPITAL_COMMUNITY): Payer: Self-pay | Admitting: Radiology

## 2012-01-25 ENCOUNTER — Emergency Department (HOSPITAL_COMMUNITY): Payer: BC Managed Care – PPO

## 2012-01-25 LAB — VALPROIC ACID LEVEL: Valproic Acid Lvl: 36.7 ug/mL — ABNORMAL LOW (ref 50.0–100.0)

## 2012-01-25 LAB — BASIC METABOLIC PANEL
CO2: 21 mEq/L (ref 19–32)
Calcium: 9 mg/dL (ref 8.4–10.5)
Chloride: 108 mEq/L (ref 96–112)
Glucose, Bld: 107 mg/dL — ABNORMAL HIGH (ref 70–99)
Potassium: 3.2 mEq/L — ABNORMAL LOW (ref 3.5–5.1)
Sodium: 140 mEq/L (ref 135–145)

## 2012-01-25 MED ORDER — VALPROATE SODIUM 500 MG/5ML IV SOLN: 500.0000 mg | Freq: Once | INTRAVENOUS | Status: DC

## 2012-01-25 MED ORDER — LORAZEPAM 2 MG/ML IJ SOLN
1.0000 mg | Freq: Once | INTRAMUSCULAR | Status: AC
  Administered 2012-01-25: 1 mg via INTRAVENOUS
  Filled 2012-01-25: qty 1

## 2012-01-25 MED ORDER — POTASSIUM CHLORIDE CRYS ER 20 MEQ PO TBCR: 20.0000 meq | EXTENDED_RELEASE_TABLET | Freq: Every day | ORAL | Status: DC

## 2012-01-25 MED ORDER — POTASSIUM CHLORIDE CRYS ER 20 MEQ PO TBCR
40.0000 meq | EXTENDED_RELEASE_TABLET | Freq: Once | ORAL | Status: AC
Start: 2012-01-25 — End: 2012-01-25
  Administered 2012-01-25: 40 meq via ORAL
  Filled 2012-01-25: qty 2

## 2012-01-25 MED ORDER — VALPROATE SODIUM 500 MG/5ML IV SOLN
500.0000 mg | Freq: Once | INTRAVENOUS | Status: DC
  Administered 2012-01-25: 500 mg via INTRAVENOUS
  Filled 2012-01-25: qty 5

## 2012-01-25 MED ORDER — SODIUM CHLORIDE 0.9 % IV SOLN
Freq: Once | INTRAVENOUS | Status: AC
  Administered 2012-01-25: 01:00:00 via INTRAVENOUS

## 2012-01-25 NOTE — ED Provider Notes (Signed)
History     CSN: [id number]  Arrival date & time 01/24/12  2234   First MD Initiated Contact with Patient 01/25/12 0058      Chief Complaint  Patient presents with  . Seizures    (Consider location/radiation/quality/duration/timing/severity/associated sxs/prior treatment) HPI Comments: Had seizure earlier in the day states has not missed medications no changes in base medical stayus.  This is a new progress of medical condition that is under evalaution since December with evaluation by neurology. Started on Depakote recently has not had level drawn yet   The history is provided by a parent.    Past Medical History  Diagnosis Date  . GERD (gastroesophageal reflux disease)   . Hyperlipidemia   . Seizures   . Thyroid disease   . Bronchitis, chronic/intermittent 01/22/2012  . Depression 01/22/2012  . IBS (irritable bowel syndrome) 01/22/2012  . Seizures     Past Surgical History  Procedure Date  . Knee surgery 2002/2003    bil    Family History  Problem Relation Age of Onset  . Thyroid disease Mother   . Cancer Maternal Grandmother     lung  . Cancer Paternal Grandmother     colon/pancreatiec/lymphoma    History  Substance Use Topics  . Smoking status: Never Smoker   . Smokeless tobacco: Not on file  . Alcohol Use: No    OB History    Grav Para Term Preterm Abortions TAB SAB Ect Mult Living                  Review of Systems  Constitutional: Positive for fatigue.  Neurological: Positive for weakness.    Allergies  Compazine and Tomato  Home Medications   Current Outpatient Rx  Name Route Sig Dispense Refill  . ACETAMINOPHEN 500 MG PO TABS Oral Take 1,000 mg by mouth every 6 (six) hours as needed. For pain    . DIVALPROEX SODIUM 500 MG PO TBEC Oral Take 500 mg by mouth daily.    . FENOFIBRATE MICRONIZED 134 MG PO CAPS Oral Take 134 mg by mouth daily before breakfast.    . LEVONORGESTREL-ETHINYL ESTRAD 0.1-20 MG-MCG PO TABS Oral Take 1 tablet by mouth  daily.    Marland Kitchen MONTELUKAST SODIUM 10 MG PO TABS Oral Take 10 mg by mouth at bedtime.    Marland Kitchen ROSUVASTATIN CALCIUM 10 MG PO TABS Oral Take 10 mg by mouth daily.    Marland Kitchen SACCHAROMYCES BOULARDII 250 MG PO CAPS Oral Take 250 mg by mouth daily.    . SERTRALINE HCL 100 MG PO TABS Oral Take 100 mg by mouth daily.    . TOPIRAMATE 200 MG PO TABS Oral Take 200 mg by mouth daily.      BP 120/65  Pulse 80  Temp(Src) 97.9 F (36.6 C) (Oral)  Resp 16  SpO2 98%  LMP 01/21/2012  Physical Exam  Constitutional: She is oriented to person, place, and time. She appears well-developed and well-nourished.  HENT:  Head: Normocephalic.  Neck: Normal range of motion.  Cardiovascular: Normal rate.   Pulmonary/Chest: Effort normal.  Abdominal: Soft.  Musculoskeletal: Normal range of motion.  Neurological: She is alert and oriented to person, place, and time.  Skin: Skin is warm.    ED Course  Procedures (including critical care time)  Labs Reviewed  DIFFERENTIAL - Abnormal; Notable for the following:    Neutrophils Relative 39 (*)    Lymphocytes Relative 52 (*)    Lymphs Abs 5.0 (*)  All other components within normal limits  BASIC METABOLIC PANEL - Abnormal; Notable for the following:    Potassium 3.2 (*)    Glucose, Bld 107 (*)    All other components within normal limits  VALPROIC ACID LEVEL - Abnormal; Notable for the following:    Valproic Acid Lvl 36.7 (*)    All other components within normal limits  CBC  URINALYSIS, ROUTINE W REFLEX MICROSCOPIC   Ct Head Wo Contrast  01/25/2012  *RADIOLOGY REPORT*  Clinical Data:  Seizure.  CT HEAD WITHOUT CONTRAST  Technique: Contiguous axial images were obtained from the base of the skull through the vertex without contrast.  Comparison: Report dated 08/07/2003.  Findings: Normal appearing cerebral hemispheres and posterior fossa structures.  Normal size and position of the ventricles.  No intracranial hemorrhage, mass lesion or evidence of acute infarction.   Unremarkable bones and included portions of the paranasal sinuses.  IMPRESSION: Normal examination.  Original Report Authenticated By: Darrol Angel, M.D.     1. Seizures   2. Seizure secondary to subtherapeutic anticonvulsant medication   3. Hypokalemia       MDM  Seizure evaluation  after review of her labs she is subtherapeutic and her Depakote as well as having a low potassium supplemented both in the emergency department.  I will recommend that she take a potassium supplement on a regular basis for the next 10 days.  I will send her home with a prescription for 20 mEq, tablets.  I've also recommended that she increase her Depakote to 500 mg twice a day.  She does have an appointment with Dr. Pearlean Brownie on Tuesday.  They can recheck her levels.  At that time        Arman Filter, NP 01/25/12 0156  Arman Filter, NP 01/25/12 0355  Arman Filter, NP 01/25/12 0355  Arman Filter, NP 01/27/12 1831

## 2012-01-25 NOTE — ED Notes (Signed)
The pts depacon has infused.  Pt remains alert no distress.  Family at the bedside

## 2012-01-25 NOTE — ED Notes (Signed)
Seizure precautions initiated-seizure pads placed on bed.

## 2012-01-25 NOTE — ED Notes (Addendum)
Patient complaining of seizure-like activity since December; family member states that seizures have become more frequent during the past two weeks.  Patient reports that seizures have started out as "dizzy spells" (back in December), progressed to absent/staring seizures, and now she is having more body spasm seizures.  Patient has been placed on Depakote by a Neurologist.  Family member states that she has been having 3-4 seizures a day for the last couple of weeks.  Family reports that patient had 7-8 seizures tonight; each lasting less than a minute.  Patient fell to her knees and then to the floor during one of the seizures tonight; hit face on floor.  Family member reports that patient stopped breathing for several seconds, had whole body spasms, and that she started breathing again during this particular seizure.  Upon arrival to room, patient changed into gown.  Patient alert and oriented x4; PERRL present.  Dondra Spry, FNP at bedside.  Will continue to monitor.

## 2012-01-25 NOTE — Discharge Instructions (Signed)
Epilepsy  A seizure (convulsion) is a sudden change in brain function that causes a change in behavior, muscle activity, or ability to remain awake and alert. If a person has recurring seizures, this is called epilepsy.  CAUSES   Epilepsy is a disorder with many possible causes. Anything that disturbs the normal pattern of brain cell activity can lead to seizures. Seizure can be caused from illness to brain damage to abnormal brain development. Epilepsy may develop because of:   An abnormality in brain wiring.   An imbalance of nerve signaling chemicals (neurotransmitters).   Some combination of these factors.  Scientists are learning an increasing amount about genetic causes of seizures.  SYMPTOMS   The symptoms of a seizure can vary greatly from one person to another. These may include:   An aura, or warning that tells a person they are about to have a seizure.   Abnormal sensations, such as abnormal smell or seeing flashing lights.   Sudden, general body stiffness.   Rhythmic jerking of the face, arm, or leg - on one or both sides.   Sudden change in consciousness.   The person may appear to be awake but not responding.   They may appear to be asleep but cannot be awakened.   Grimacing, chewing, lip smacking, or drooling.   Often there is a period of sleepiness after a seizure.  DIAGNOSIS   The description you give to your caregiver about what you experienced will help them understand your problems. Equally important is the description by any witnesses to your seizure. A physical exam, including a detailed neurological exam, is necessary. An EEG (electroencephalogram) is a painless test of your brain waves. In this test a diagram is created of your brain waves. These diagrams can be interpreted by a specialist. Pictures of your brain are usually taken with:   An MRI.   A CT scan.  Lab tests may be done to look for:   Signs of infection.   Abnormal blood chemistry.  PREVENTION   There is no way to  prevent the development of epilepsy. If you have seizures that are typically triggered by an event (such as flashing lights), try to avoid the trigger. This can help you avoid a seizure.   PROGNOSIS   Most people with epilepsy lead outwardly normal lives. While epilepsy cannot currently be cured, for some people it does eventually go away. Most seizures do not cause brain damage. It is not uncommon for people with epilepsy, especially children, to develop behavioral and emotional problems. These problems are sometimes the consequence of medicine for seizures or social stress. For some people with epilepsy, the risk of seizures restricts their independence and recreational activities. For example, some states refuse drivers licenses to people with epilepsy.  Most women with epilepsy can become pregnant. They should discuss their epilepsy and the medicine they are taking with their caregivers. Women with epilepsy have a 90 percent or better chance of having a normal, healthy baby.  RISKS AND COMPLICATIONS   People with epilepsy are at increased risk of falls, accidents, and injuries. People with epilepsy are at special risk for two life-threatening conditions. These are status epilepticus and sudden unexplained death (extremely rare). Status epilepticus is a long lasting, continuous seizure that is a medical emergency.  TREATMENT   Once epilepsy is diagnosed, it is important to begin treatment as soon as possible. For about 80 percent of those diagnosed with epilepsy, seizures can be controlled with modern medicines   FDA approved a pacemaker for the brain the (vagus nerve stimulator). This stimulator can be used for people with seizures that are not well-controlled by medicine. Studies have shown that in some cases, children may experience fewer seizures if they maintain a  strict diet. The strict diet is called the ketogenic diet. This diet is rich in fats and low in carbohydrates. HOME CARE INSTRUCTIONS   Your caregiver will make recommendations about driving and safety in normal activities. Follow these carefully.   Take any medicine prescribed exactly as directed.   Do any blood tests requested to monitor the levels of your medicine.   The people you live and work with should know that you are prone to seizures. They should receive instructions on how to help you. In general, a witness to a seizure should:   Cushion your head and body.   Turn you on your side.   Avoid unnecessarily restraining you.   Not place anything inside your mouth.   Call for local emergency medical help if there is any question about what has occurred.   Keep a seizure diary. Record what you recall about any seizure, especially any possible trigger.   If your caregiver has given you a follow-up appointment, it is very important to keep that appointment. Not keeping the appointment could result in permanent injury and disability. If there is any problem keeping the appointment, you must call back to this facility for assistance.  SEEK MEDICAL CARE IF:   You develop signs of infection or other illness. This might increase the risk of a seizure.   You seem to be having more frequent seizures.   Your seizure pattern is changing.  SEEK IMMEDIATE MEDICAL CARE IF:   A seizure does not stop after a few moments.   A seizure causes any difficulty in breathing.   A seizure results in a very severe headache.   A seizure leaves you with the inability to speak or use a part of your body.  MAKE SURE YOU:   Understand these instructions.   Will watch your condition.   Will get help right away if you are not doing well or get worse.  Document Released: 11/03/2005 Document Revised: 10/23/2011 Document Reviewed: 06/09/2008 [facility] Patient Information 2012 [place],  [place].Hypokalemia Hypokalemia means a low potassium level in the blood.Potassium is an electrolyte that helps regulate the amount of fluid in the body. It also stimulates muscle contraction and maintains a stable acid-base balance.Most of the body's potassium is inside of cells, and only a very small amount is in the blood. Because the amount in the blood is so small, minor changes can have big effects. PREPARATION FOR TEST Testing for potassium requires taking a blood sample taken by needle from a vein in the arm. The skin is cleaned thoroughly before the sample is drawn. There is no other special preparation needed. NORMAL VALUES Potassium levels below 3.5 mEq/L are abnormally low. Levels above 5.1 mEq/L are abnormally high. Ranges for normal findings may vary among different laboratories and hospitals. You should always check with your doctor after having lab work or other tests done to discuss the meaning of your test results and whether your values are considered within normal limits. MEANING OF TEST  Your caregiver will go over the test results with you and discuss the importance and meaning of your results, as well as treatment options and the need for additional tests, if necessary. A potassium level is frequently part of a routine medical  exam. It is usually included as part of a whole "panel" of tests for several blood salts (such as Sodium and Chloride). It may be done as part of follow-up when a low potassium level was found in the past or other blood salts are suspected of being out of balance. A low potassium level might be suspected if you have one or more of the following:  Symptoms of weakness.   Abnormal heart rhythms.   High blood pressure and are taking medication to control this, especially water pills (diuretics).   Kidney disease that can affect your potassium level .   Diabetes requiring the use of insulin. The potassium may fall after taking insulin, especially if the  diabetes had been out of control for a while.   A condition requiring the use of cortisone-type medication or certain types of antibiotics.   Vomiting and/or diarrhea for more than a day or two.   A stomach or intestinal condition that may not permit appropriate absorption of potassium.   Fainting episodes.   Mental confusion.  OBTAINING TEST RESULTS It is your responsibility to obtain your test results. Ask the lab or department performing the test when and how you will get your results.  Please contact your caregiver directly if you have not received the results within one week. At that time, ask if there is anything different or new you should be doing in relation to the results. TREATMENT Hypokalemia can be treated with potassium supplements taken by mouth and/or adjustments in your current medications. A diet high in potassium is also helpful. Foods with high potassium content are:  Peas, lentils, lima beans, nuts, and dried fruit.   Whole grain and bran cereals and breads.   Fresh fruit, vegetables (bananas, cantaloupe, grapefruit, oranges, tomatoes, honeydew melons, potatoes).   Orange and tomato juices.   Meats. If potassium supplement has been prescribed for you today or your medications have been adjusted, see your personal caregiver in time02 for a re-check.  SEEK MEDICAL CARE IF:  There is a feeling of worsening weakness.   You experience repeated chest palpitations.   You are diabetic and having difficulty keeping your blood sugars in the normal range.   You are experiencing vomiting and/or diarrhea.   You are having difficulty with any of your regular medications.  SEEK IMMEDIATE MEDICAL CARE IF:  You experience chest pain, shortness of breath, or episodes of dizziness.   You have been having vomiting or diarrhea for more than 2 days.   You have a fainting episode.  MAKE SURE YOU:   Understand these instructions.   Will watch your condition.   Will  get help right away if you are not doing well or get worse.  Document Released: 11/03/2005 Document Revised: 10/23/2011 Document Reviewed: 10/14/2008 [facility] Patient Information 2012 [place], [place]. As discussed.  I recommend that you take 2 tablets of your Depakote, one in the morning and one at night to a total of 1000 mg daily.  I've also given you a prescription for potassium 1 tablet daily for the next 10 days at the time of your appointment on Tuesday.  Please have Dr. [name] office.  Check your typical, as well as her potassium level out called Dr. [name] office just to make him aware that you had an emergency room evaluation done today, so they can review your records.  Prior to your appointment on Tuesday

## 2012-01-25 NOTE — ED Notes (Signed)
Registration at bedside.

## 2012-01-25 NOTE — ED Notes (Signed)
The pt  Is alert mother remains at the bedside

## 2012-01-28 NOTE — ED Provider Notes (Signed)
Medical screening examination/treatment/procedure(s) were performed by non-physician practitioner and as supervising physician I was immediately available for consultation/collaboration.   Vida Roller, MD 01/28/12 [phone]

## 2012-04-13 ENCOUNTER — Encounter (INDEPENDENT_AMBULATORY_CARE_PROVIDER_SITE_OTHER): Payer: Self-pay

## 2012-04-15 ENCOUNTER — Encounter (INDEPENDENT_AMBULATORY_CARE_PROVIDER_SITE_OTHER): Payer: Self-pay

## 2012-08-26 ENCOUNTER — Encounter (HOSPITAL_COMMUNITY): Payer: Self-pay

## 2012-08-26 ENCOUNTER — Emergency Department (HOSPITAL_COMMUNITY): Payer: BC Managed Care – PPO

## 2012-08-26 ENCOUNTER — Observation Stay (HOSPITAL_COMMUNITY)
Admission: EM | Admit: 2012-08-26 | Discharge: 2012-08-27 | Disposition: A | Discharge status: Home / Self Care | Payer: BC Managed Care – PPO | Attending: Internal Medicine | Admitting: Internal Medicine

## 2012-08-26 DIAGNOSIS — R6889 Other general symptoms and signs: Secondary | ICD-10-CM

## 2012-08-26 DIAGNOSIS — K219 Gastro-esophageal reflux disease without esophagitis: Secondary | ICD-10-CM | POA: Insufficient documentation

## 2012-08-26 DIAGNOSIS — F329 Major depressive disorder, single episode, unspecified: Secondary | ICD-10-CM

## 2012-08-26 DIAGNOSIS — E785 Hyperlipidemia, unspecified: Secondary | ICD-10-CM | POA: Insufficient documentation

## 2012-08-26 DIAGNOSIS — R569 Unspecified convulsions: Principal | ICD-10-CM | POA: Insufficient documentation

## 2012-08-26 DIAGNOSIS — Z79899 Other long term (current) drug therapy: Secondary | ICD-10-CM | POA: Insufficient documentation

## 2012-08-26 DIAGNOSIS — F3289 Other specified depressive episodes: Secondary | ICD-10-CM

## 2012-08-26 DIAGNOSIS — E079 Disorder of thyroid, unspecified: Secondary | ICD-10-CM

## 2012-08-26 DIAGNOSIS — F32A Depression, unspecified: Secondary | ICD-10-CM

## 2012-08-26 LAB — CBC
HCT: 36.3 % (ref 36.0–46.0)
Hemoglobin: 12.2 g/dL (ref 12.0–15.0)
MCHC: 33.6 g/dL (ref 30.0–36.0)
RBC: 3.93 MIL/uL (ref 3.87–5.11)
WBC: 9.5 10*3/uL (ref 4.0–10.5)

## 2012-08-26 LAB — CBC WITH DIFFERENTIAL/PLATELET
Basophils Absolute: 0 10*3/uL (ref 0.0–0.1)
Eosinophils Absolute: 0.1 10*3/uL (ref 0.0–0.7)
Eosinophils Relative: 1 % (ref 0–5)
Lymphs Abs: 3 10*3/uL (ref 0.7–4.0)
MCH: 31 pg (ref 26.0–34.0)
MCV: 93.1 fL (ref 78.0–100.0)
Neutrophils Relative %: 45 % (ref 43–77)
Platelets: 260 10*3/uL (ref 150–400)
RBC: 3.61 MIL/uL — ABNORMAL LOW (ref 3.87–5.11)
RDW: 13 % (ref 11.5–15.5)
WBC: 6.7 10*3/uL (ref 4.0–10.5)

## 2012-08-26 LAB — CK TOTAL AND CKMB (NOT AT ARMC): Total CK: 64 U/L (ref 7–177)

## 2012-08-26 LAB — BASIC METABOLIC PANEL
BUN: 4 mg/dL — ABNORMAL LOW (ref 6–23)
Calcium: 8.5 mg/dL (ref 8.4–10.5)
Chloride: 109 mEq/L (ref 96–112)
GFR calc Af Amer: >90 mL/min (ref >90)
GFR calc Af Amer: >90 mL/min (ref >90)
GFR calc non Af Amer: >90 mL/min (ref >90)
GFR calc non Af Amer: 89 mL/min — ABNORMAL LOW (ref >90)
Glucose, Bld: 100 mg/dL — ABNORMAL HIGH (ref 70–99)
Potassium: 3.7 mEq/L (ref 3.5–5.1)
Potassium: 3.6 mEq/L (ref 3.5–5.1)
Sodium: 141 mEq/L (ref 135–145)
Sodium: 141 mEq/L (ref 135–145)

## 2012-08-26 LAB — VALPROIC ACID LEVEL: Valproic Acid Lvl: 40.3 ug/mL — ABNORMAL LOW (ref 50.0–100.0)

## 2012-08-26 MED ORDER — DIVALPROEX SODIUM ER 250 MG PO TB24
250.0000 mg | ORAL_TABLET | Freq: Two times a day (BID) | ORAL | Status: DC
Start: 2012-08-27 — End: 2012-08-27
  Administered 2012-08-27: 250 mg via ORAL
  Filled 2012-08-26 (×2): qty 1

## 2012-08-26 MED ORDER — SODIUM CHLORIDE 0.9 % IJ SOLN
3.0000 mL | Freq: Two times a day (BID) | INTRAMUSCULAR | Status: DC
  Administered 2012-08-26: 3 mL via INTRAVENOUS

## 2012-08-26 MED ORDER — ONDANSETRON HCL 4 MG PO TABS: 4.0000 mg | ORAL_TABLET | Freq: Four times a day (QID) | ORAL | Status: DC | PRN

## 2012-08-26 MED ORDER — MONTELUKAST SODIUM 10 MG PO TABS
10.0000 mg | ORAL_TABLET | Freq: Every day | ORAL | Status: DC
  Administered 2012-08-26: 10 mg via ORAL
  Filled 2012-08-26 (×2): qty 1

## 2012-08-26 MED ORDER — LORAZEPAM 2 MG/ML IJ SOLN
1.0000 mg | Freq: Once | INTRAMUSCULAR | Status: AC
  Administered 2012-08-26: 1 mg via INTRAVENOUS
  Filled 2012-08-26: qty 1

## 2012-08-26 MED ORDER — ACETAMINOPHEN 650 MG RE SUPP: 650.0000 mg | Freq: Four times a day (QID) | RECTAL | Status: DC | PRN

## 2012-08-26 MED ORDER — ATORVASTATIN CALCIUM 20 MG PO TABS
20.0000 mg | ORAL_TABLET | Freq: Every day | ORAL | Status: DC
Start: 2012-08-26 — End: 2012-08-27
  Administered 2012-08-26 – 2012-08-27 (×2): 20 mg via ORAL
  Filled 2012-08-26 (×2): qty 1

## 2012-08-26 MED ORDER — TOPIRAMATE 100 MG PO TABS
200.0000 mg | ORAL_TABLET | Freq: Every day | ORAL | Status: DC
  Administered 2012-08-26 – 2012-08-27 (×2): 200 mg via ORAL
  Filled 2012-08-26 (×2): qty 2

## 2012-08-26 MED ORDER — DIVALPROEX SODIUM ER 500 MG PO TB24
500.0000 mg | ORAL_TABLET | Freq: Every day | ORAL | Status: DC
  Filled 2012-08-26: qty 1

## 2012-08-26 MED ORDER — SODIUM CHLORIDE 0.9 % IV SOLN
INTRAVENOUS | Status: DC
  Administered 2012-08-26: 10:00:00 via INTRAVENOUS

## 2012-08-26 MED ORDER — LORAZEPAM 2 MG/ML IJ SOLN
INTRAMUSCULAR | Status: AC
  Administered 2012-08-26: 1 mg
  Filled 2012-08-26: qty 1

## 2012-08-26 MED ORDER — LAMOTRIGINE 200 MG PO TABS
200.0000 mg | ORAL_TABLET | Freq: Every day | ORAL | Status: DC
  Administered 2012-08-26 – 2012-08-27 (×2): 200 mg via ORAL
  Filled 2012-08-26 (×2): qty 1

## 2012-08-26 MED ORDER — ONDANSETRON HCL 4 MG/2ML IJ SOLN: 4.0000 mg | Freq: Four times a day (QID) | INTRAMUSCULAR | Status: DC | PRN

## 2012-08-26 MED ORDER — ACETAMINOPHEN 325 MG PO TABS
650.0000 mg | ORAL_TABLET | Freq: Four times a day (QID) | ORAL | Status: DC | PRN
  Filled 2012-08-26: qty 2

## 2012-08-26 MED ORDER — DIVALPROEX SODIUM ER 250 MG PO TB24
250.0000 mg | ORAL_TABLET | Freq: Every day | ORAL | Status: DC
  Administered 2012-08-26: 250 mg via ORAL
  Filled 2012-08-26: qty 1

## 2012-08-26 MED ORDER — BUSPIRONE HCL 15 MG PO TABS
15.0000 mg | ORAL_TABLET | Freq: Three times a day (TID) | ORAL | Status: DC
  Administered 2012-08-26 – 2012-08-27 (×5): 15 mg via ORAL
  Filled 2012-08-26 (×6): qty 1

## 2012-08-26 MED ORDER — FENOFIBRATE 160 MG PO TABS
160.0000 mg | ORAL_TABLET | Freq: Every day | ORAL | Status: DC
  Administered 2012-08-26 – 2012-08-27 (×2): 160 mg via ORAL
  Filled 2012-08-26 (×2): qty 1

## 2012-08-26 MED ORDER — INFLUENZA VIRUS VACC SPLIT PF IM SUSP
0.5000 mL | INTRAMUSCULAR | Status: AC
  Administered 2012-08-27: 0.5 mL via INTRAMUSCULAR
  Filled 2012-08-26: qty 0.5

## 2012-08-26 MED ORDER — SODIUM CHLORIDE 0.9 % IV SOLN
500.0000 mg | Freq: Once | INTRAVENOUS | Status: AC
  Administered 2012-08-26: 500 mg via INTRAVENOUS
  Filled 2012-08-26: qty 5

## 2012-08-26 NOTE — Progress Notes (Signed)
Pt had a seizing episode lasting from 11:20-11:35. Pt had body and facial twitching during episode and was unresponsive. VSS. HR 110-120s. MD notified. New orders received, new medication to be given. Will continue to closely monitor pt. Family at bedside.

## 2012-08-26 NOTE — ED Notes (Signed)
Seizure episode lasted approximately 10 minutes.  O2 desat at 74.

## 2012-08-26 NOTE — Care Management Note (Unsigned)
    Page 1 of 1   08/26/2012     2:42:29 PM   CARE MANAGEMENT NOTE 08/26/2012  Patient:  Autumn Davis, Autumn Davis   Account Number:  [id number]  Date Initiated:  08/26/2012  Documentation initiated by:  Lanier Clam  Subjective/Objective Assessment:   ADMITTED [id number].     Action/Plan:   FROM HOME   Anticipated DC Date:  08/27/2012   Anticipated DC Plan:  HOME/SELF CARE      DC Planning Services  CM consult      Choice offered to / List presented to:             Status of service:  In process, will continue to follow Medicare Important Message given?   (If response is "NO", the following Medicare IM given date fields will be blank) Date Medicare IM given:   Date Additional Medicare IM given:    Discharge Disposition:    Per UR Regulation:  Reviewed for med. necessity/level of care/duration of stay  If discussed at Long Length of Stay Meetings, dates discussed:    Comments:  08/26/12 Zayleigh Stroh RN,BSN NCM [phone] NEURO CONS.PSYCH CONS.

## 2012-08-26 NOTE — ED Provider Notes (Signed)
History     CSN: [id number]  Arrival date & time 08/26/12  0005   First MD Initiated Contact with Patient 08/26/12 [phone]      Chief Complaint  Patient presents with  . Seizures    (Consider location/radiation/quality/duration/timing/severity/associated sxs/prior treatment) HPI HX per PT, EMS and family, spells lasting 2 hours tonight. Per mother more intense than usual. Has had multiple evaluations for these spells possible seizures. Taking multiple seizure medications. No recent sleep deprivation, denies drug use. No F/C or recent illness. Mod in severity, no known aggrevating factors  Followed by dr Ave Filter in [facility], Neuropsychiatrist. 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   Past Medical History  Diagnosis Date  . GERD (gastroesophageal reflux disease)   . Hyperlipidemia   . Seizures   . Thyroid disease   . Bronchitis, chronic/intermittent 01/22/2012  . Depression 01/22/2012  . IBS (irritable bowel syndrome) 01/22/2012  . Seizures     Past Surgical History  Procedure Date  . Knee surgery 2002/2003    bil    Family History  Problem Relation Age of Onset  . Thyroid disease Mother   . Cancer Maternal Grandmother     lung  . Cancer Paternal Grandmother     colon/pancreatiec/lymphoma    History  Substance Use Topics  . Smoking status: Never Smoker   . Smokeless tobacco: Not on file  . Alcohol Use: No    OB History    Grav Para Term Preterm Abortions TAB SAB Ect Mult Living                  Review of Systems  Constitutional: Negative for fever and chills.  HENT: Negative for neck pain and neck stiffness.   Eyes: Negative for pain.  Respiratory: Negative for shortness of breath.   Cardiovascular: Negative for chest pain.  Gastrointestinal: Negative for abdominal pain.  Genitourinary: Negative for dysuria.  Musculoskeletal: Negative for back pain.  Skin: Negative for rash.  Neurological: Positive for seizures. Negative for headaches.  All other  systems reviewed and are negative.    Allergies  Compazine and Tomato  Home Medications   Current Outpatient Rx  Name Route Sig Dispense Refill  . BUSPIRONE HCL 15 MG PO TABS Oral Take 15 mg by mouth 3 (three) times daily.    Marland Kitchen DIVALPROEX SODIUM ER 500 MG PO TB24 Oral Take 500-1,000 mg by mouth daily. Take 1 tablet in the morning Take 2 tablets at bedtime    . FENOFIBRATE MICRONIZED 134 MG PO CAPS Oral Take 134 mg by mouth daily before breakfast.    . LAMOTRIGINE 200 MG PO TABS Oral Take 200 mg by mouth daily.    Marland Kitchen MONTELUKAST SODIUM 10 MG PO TABS Oral Take 10 mg by mouth at bedtime.    Marland Kitchen ROSUVASTATIN CALCIUM 10 MG PO TABS Oral Take 10 mg by mouth daily.    . TOPIRAMATE 200 MG PO TABS Oral Take 200 mg by mouth daily.    . ACETAMINOPHEN 500 MG PO TABS Oral Take 1,000 mg by mouth every 6 (six) hours as needed. For pain      BP 116/67  Pulse 105  Temp 99.2 F (37.3 C) (Oral)  Resp 22  SpO2 99%  Physical Exam  Nursing note and vitals reviewed. Constitutional: She is oriented to person, place, and time. She appears well-developed and well-nourished.  HENT:  Head: Normocephalic and atraumatic.  Eyes: Conjunctivae normal and EOM are normal. Pupils are equal, round,  and reactive to light.  Neck: Full passive range of motion without pain. Neck supple. No thyromegaly present.       No meningismus  Cardiovascular: Normal rate, regular rhythm, S1 normal, S2 normal, normal heart sounds and intact distal pulses.   Pulmonary/Chest: Effort normal and breath sounds normal. No respiratory distress.  Abdominal: Soft. Bowel sounds are normal. There is no tenderness. There is no CVA tenderness.  Musculoskeletal: Normal range of motion. She exhibits no edema.  Neurological: She is alert and oriented to person, place, and time. She has normal strength and normal reflexes. No cranial nerve deficit or sensory deficit. She displays a negative Romberg sign. GCS eye subscore is 4. GCS verbal subscore is  5. GCS motor subscore is 6.       Normal Gait  Skin: Skin is warm and dry. No rash noted. No cyanosis. Nails show no clubbing.  Psychiatric: She has a normal mood and affect. Her speech is normal and behavior is normal.    ED Course  Procedures (including critical care time)  Results for orders placed during the hospital encounter of 08/26/12  VALPROIC ACID LEVEL      Component Value Range   Valproic Acid Lvl 40.3 (*) 50.0 - 100.0 ug/mL  CBC      Component Value Range   WBC 9.5  4.0 - 10.5 K/uL   RBC 3.93  3.87 - 5.11 MIL/uL   Hemoglobin 12.2  12.0 - 15.0 g/dL   HCT 36.6  44.0 - 34.7 %   MCV 92.4  78.0 - 100.0 fL   MCH 31.0  26.0 - 34.0 pg   MCHC 33.6  30.0 - 36.0 g/dL   RDW 42.5  95.6 - 38.7 %   Platelets 315  150 - 400 K/uL  BASIC METABOLIC PANEL      Component Value Range   Sodium 141  135 - 145 mEq/L   Potassium 3.7  3.5 - 5.1 mEq/L   Chloride 109  96 - 112 mEq/L   CO2 19  19 - 32 mEq/L   Glucose, Bld 100 (*) 70 - 99 mg/dL   BUN 4 (*) 6 - 23 mg/dL   Creatinine, Ser 5.64  0.50 - 1.10 mg/dL   Calcium 8.8  8.4 - 33.2 mg/dL   GFR calc non Af Amer >90  >90 mL/min   GFR calc Af Amer >90  >90 mL/min    Date: 08/26/2012  Rate: 104  Rhythm: sinus tachycardia  QRS Axis: normal  Intervals: normal  ST/T Wave abnormalities: nonspecific ST changes  Conduction Disutrbances:none  Narrative Interpretation:   Old EKG Reviewed: none available  IV ativan for sz activity. Repeated sz activity requiring ativan again. PT dropped stas to 70%. Labs and ECG and CT brain. PT returned to baseline. MEd c/s for admit  D/w Dr Doran Heater - will admit   MDM   Sz activity possible underlying psychiatric issues. Hypoxia with these spells, labs. CT scan. IV ativan. MED admit        Sunnie Nielsen, MD 08/26/12 [phone]

## 2012-08-26 NOTE — Progress Notes (Signed)
Pt. Had a seizing episode lasting form 3:25-3:32 .  Pt. Had body and facial twitching and was not responsive.  BP 118/65, O2 100%, Resp 24 , and HR reached 150 briefly non sustaining and went back down to low 100's. Pt. Stated after episode that she was in no pain.  MD notified.  Will continue to monitor closely.  Family is at bedside.

## 2012-08-26 NOTE — H&P (Signed)
Autumn Davis is an 26 y.o. female.   Patient was seen and examined on August 26, 2012. PCP - [facility]. Chief Complaint: Seizures. HPI: 26 year-old female with history of depression and hyperlipidemia was brought to the ER patient was found to have convulsions. Patient's family states that last night at dinnertime she started having facial twitching mostly in the eyelids and eventually started having left-sided the face. This happened multiple times and also started having this removed in the left upper extremity. This was having off and on multiple times, after each episode patient gets drowsy. She did not have any tongue bite or incontinence of urine. She was brought to the ER and on the way again patient had similar episode and was given Ativan by the EMS. She also had similar episode in the ER and was given Ativan again. Patient was diagnosed with seizures this January and was referred to [facility] by Dr. Pearlean Brownie. Over there patient had prolonged studies for seizures on video monitoring and as per patient's mother the study was negative for seizures. Eventually patient was referred to neuropsychiatric Dr. Ave Filter who at this time is trying to wean her off Depakote. Her Depakote dose has been decreased from 500 mg twice daily to 250 mg in the morning and 500 mg in the evening. Patient's Depakote levels are subtherapeutic at this time. When I examined the patient patient was alert awake and oriented. Following commands. A few minutes later patient did have episodes where she was shaking her sideways. When I opened her eyes these shaking spells stopped.  I discussed with Dr. Pearlean Brownie. Neurologist on-call. At this time they will be seeing patient in consult and advised also psychiatric consult and not to start any new medications or load Depakote.  Past Medical History  Diagnosis Date  . GERD (gastroesophageal reflux disease)   . Hyperlipidemia   . Seizures   .  Thyroid disease   . Bronchitis, chronic/intermittent 01/22/2012  . Depression 01/22/2012  . IBS (irritable bowel syndrome) 01/22/2012  . Seizures     Past Surgical History  Procedure Date  . Knee surgery 2002/2003    bil    Family History  Problem Relation Age of Onset  . Thyroid disease Mother   . Cancer Maternal Grandmother     lung  . Cancer Paternal Grandmother     colon/pancreatiec/lymphoma   Social History:  reports that she has never smoked. She does not have any smokeless tobacco history on file. She reports that she does not drink alcohol or use illicit drugs.  Allergies:  Allergies  Allergen Reactions  . Compazine Other (See Comments)    hallucinations  . Tomato Rash     (Not in a hospital admission)  Results for orders placed during the hospital encounter of 08/26/12 (from the past 48 hour(s))  VALPROIC ACID LEVEL     Status: Abnormal   Collection Time   08/26/12 12:23 AM      Component Value Range Comment   Valproic Acid Lvl 40.3 (*) 50.0 - 100.0 ug/mL   CBC     Status: Normal   Collection Time   08/26/12 12:23 AM      Component Value Range Comment   WBC 9.5  4.0 - 10.5 K/uL    RBC 3.93  3.87 - 5.11 MIL/uL    Hemoglobin 12.2  12.0 - 15.0 g/dL    HCT 29.5  62.1 - 30.8 %    MCV 92.4  78.0 -  100.0 fL    MCH 31.0  26.0 - 34.0 pg    MCHC 33.6  30.0 - 36.0 g/dL    RDW 16.1  09.6 - 04.5 %    Platelets 315  150 - 400 K/uL   BASIC METABOLIC PANEL     Status: Abnormal   Collection Time   08/26/12 12:23 AM      Component Value Range Comment   Sodium 141  135 - 145 mEq/L    Potassium 3.7  3.5 - 5.1 mEq/L    Chloride 109  96 - 112 mEq/L    CO2 19  19 - 32 mEq/L    Glucose, Bld 100 (*) 70 - 99 mg/dL    BUN 4 (*) 6 - 23 mg/dL    Creatinine, Ser 4.09  0.50 - 1.10 mg/dL    Calcium 8.8  8.4 - 81.1 mg/dL    GFR calc non Af Amer >90  >90 mL/min    GFR calc Af Amer >90  >90 mL/min   POCT PREGNANCY, URINE     Status: Normal   Collection Time   08/26/12  6:15 AM       Component Value Range Comment   Preg Test, Ur NEGATIVE  NEGATIVE    Ct Head Wo Contrast  08/26/2012  *RADIOLOGY REPORT*  Clinical Data: 26 year old female with multiple episodes of seizure like activity.  CT HEAD WITHOUT CONTRAST  Technique:  Contiguous axial images were obtained from the base of the skull through the vertex without contrast.  Comparison: 01/25/2012 and earlier.  Findings: Visualized paranasal sinuses and mastoids are clear.  No acute osseous abnormality identified.  Visualized orbits and scalp soft tissues are within normal limits.  Normal cerebral volume.  No ventriculomegaly. No midline shift, mass effect, or evidence of mass lesion.  Gray-white matter differentiation is within normal limits throughout the brain.  No evidence of cortically based acute infarction identified.  No suspicious intracranial vascular hyperdensity. No acute intracranial hemorrhage identified.  IMPRESSION: Stable and normal noncontrast CT appearance of the brain.   Original Report Authenticated By: Harley Hallmark, M.D.     Review of Systems  Constitutional: Negative.   HENT: Negative.   Eyes: Negative.   Respiratory: Negative.   Cardiovascular: Negative.   Gastrointestinal: Negative.   Genitourinary: Negative.   Musculoskeletal: Negative.   Skin: Negative.   Neurological: Positive for seizures.  Endo/Heme/Allergies: Negative.   Psychiatric/Behavioral: Negative.     Blood pressure 116/67, pulse 105, temperature 99.2 F (37.3 C), temperature source Oral, resp. rate 22, SpO2 99.00%. Physical Exam  Constitutional: She is oriented to person, place, and time. She appears well-developed and well-nourished. No distress.  HENT:  Head: Normocephalic and atraumatic.  Right Ear: External ear normal.  Left Ear: External ear normal.  Nose: Nose normal.  Mouth/Throat: Oropharynx is clear and moist. No oropharyngeal exudate.  Eyes: Conjunctivae normal are normal. Pupils are equal, round, and reactive  to light. Right eye exhibits no discharge. Left eye exhibits no discharge. No scleral icterus.  Neck: Normal range of motion. Neck supple.  Cardiovascular: Normal rate and regular rhythm.   Respiratory: Effort normal and breath sounds normal. No respiratory distress. She has no wheezes. She has no rales.  GI: Soft. Bowel sounds are normal. She exhibits no distension. There is no tenderness. There is no rebound.  Musculoskeletal: Normal range of motion. She exhibits no edema and no tenderness.  Neurological: She is alert and oriented to person, place, and time.  Moves all extremities 5/5. No facial asymmetry.  Skin: Skin is warm and dry. She is not diaphoretic.     Assessment/Plan #1. Convulsions - at this time I have discussed with Dr. Pearlean Brownie neurologist on-call. Dr. Pearlean Brownie has recommended not to change medications and not to load Depakote at this time. They will be seeing in consult. And also has recommended psychiatrist consult in a.m. Patient will be on seizure precautions and I have not ordered any further Ativan when necessary. #2. Hyperlipidemia - continue statins. #3. History of migraine - continue Topamax.  Patient's pregnancy screen is pending.  CODE STATUS - full code.  Christoffer Currier N. 08/26/2012, 6:17 AM

## 2012-08-26 NOTE — Progress Notes (Signed)
Pt had a seizing episode from 11:58 until 12:08. Pt was having twitching on left side, both lower extreme ties, and face. Pt was unresponsive during episode. HR 110-120s. VS: 106/63 RR-18 98% on 2L O2. Will continue to closely monitor. MD was notified.  Family at bedside.

## 2012-08-26 NOTE — ED Notes (Signed)
Work note given to husband. Transferred to 4 E

## 2012-08-26 NOTE — ED Notes (Signed)
Per EMS, called to pt home with statement of seizures for around 2 hours prior to EMS arrival.  Per family, pt has 6-7 seizures per day.  Is seeing neurology and taking depakote.  Pt postictal on arrival.  Per EMS, pt seized in route and was given 2mg  IV ativan.  Pt arrives alert and oriented at this time.  IV in left hand 22 g.  Vitals  134/88, hr 90's, resp 16.  cbg 91

## 2012-08-26 NOTE — Progress Notes (Signed)
Pt. Had another seizing episode that lasted from 6:49-6:54. Pt. Had muscle twitching her legs, arms, and face.  VS BP: 113/66, O2 100%, Resp 22, HR 104. Will continue to monitor. Family is present at bedside.

## 2012-08-26 NOTE — ED Notes (Signed)
Pt made statement that during seizures the EMS took a long time to get there.  Asked pt if she remembered and she stated she remembered everything.  Stated she also remember IV attempts post seizure in route with EMS and that they were painful.  Per EMS, pt did not speak until arriving in ambulance bay and was responding as if postictal after seizure.

## 2012-08-26 NOTE — ED Notes (Signed)
Pt twitching in face and arms.  Notified MD.  Ativan ordered.

## 2012-08-26 NOTE — ED Notes (Signed)
Per Daphine Deutscher, [place], pt actively seized again.  Pt not responding to verbal command at this time.  Pt airway maintained and vitals stable.  Notified MD.

## 2012-08-26 NOTE — ED Notes (Signed)
Called to pt room per family seizure-like activity.  Pulled pt arm above head and released.  Pt arm fell back to pt side.  Continued to do same and Pt held arm in the air.  Pt would not respond to verbal during encounter.  Returned 2 min later to room and pt states she is 26 years old and is not recognizing family.

## 2012-08-26 NOTE — Progress Notes (Signed)
  Patient admitted after midnight by my colleague Dr. Toniann Fail; please refer to his H&P for further details. But briefly patient admitted secondary to seizure vs pseudoseizure episodes. Currently with one episode again of body and face twitching;  VSS and no exactly frank post-ictal symptoms. Keppra given and will follow neurology and psych recommendations.  Autumn Davis [phone]

## 2012-08-26 NOTE — ED Notes (Signed)
Pt noted actively seizing at this time.

## 2012-08-26 NOTE — ED Notes (Signed)
Per pt, she has had seizures since January.  Pt was told not to call for help unless pt stops breathing.  Tonight pt was seizing for over an hour before calling.  Pt began having episodes of change in her breathing and called EMS.  Pt has been seen by multiple MD's.  Pt has even been seen by psychiatry questioning anxiety induced.  Pt takes a total 750mg  depakote per day.

## 2012-08-26 NOTE — ED Notes (Signed)
KGM:[id number]<VO> Expected date:<BR> Expected time:<BR> Means of arrival:<BR> Comments:<BR> EMS/26 yo with seizure for 2 hours-versed with no cessation of seizure activity

## 2012-08-26 NOTE — Progress Notes (Signed)
Pt. Had another seizing episode that lasted from 5:18-5:27.  Pt. Had facial and body twitching and was not responsive.  BP 108/66, O2 100%, Resp 22, and HR 97. Pt. Has no complaints of pain. MD was notified. Will continue to monitor. Mother and family at bedside.

## 2012-08-26 NOTE — Progress Notes (Signed)
Pt. Had another seizing episode that last from 5:44-5:56.  Pt. Had muscle twitching in her arms, legs, and face. During episode Pt. Was making moaning sounds and licking lips. Pt. Was not responsive during episode and would have apneic periods of breathing.  VS: BP 95/69 O2 100%, Resp 22, HR 111. MD was notified. Will continue to monitor closely. Mother at bedside with Pt.

## 2012-08-26 NOTE — Consult Note (Signed)
[facility] CONSULT [facility]     Reason for Consult: seizure    HPI:    Autumn Davis is an 26 y.o. female history of depression and hyperlipidemia was brought to the ER After patient was having multiple facial movements bilaterally with left arm movements.  At one point she stopped breathing and family members state they had to stimulate her to breath.  Mother who is at bedside has two videos which I looked at.  Both showed erratic eye twitching, eyes were held straight forward with no lateralization and at one time she looked at the phone when it was moved.  Her left arm was rubbing her stomach in a vertical manner.  There was no foaming at the mouth, no TC jerking, no posturing or extensor movement.   Per family this action was on going for greater than one hour.  She was brought to the ER and on the way again patient had similar episode and was given Ativan by the EMS. Patient was diagnosed with seizures this January and was referred to [facility] by Dr. Pearlean Brownie. She was seen in [place] and had a prolonged EEG on video monitoring and as per patient's mother the study was negative for seizures. Eventually patient was referred to 2 neuropsychiatric MD's with the last  Being Dr. Ave Filter who at this time is trying to wean her off Depakote. Her Depakote dose has been decreased from 500 mg twice daily to 250 mg in the morning and 500 mg in the evening. She is also on Lamictal, Topamax.  Patient's Depakote levels are subtherapeutic at this time.     Past Medical History  Diagnosis Date  . GERD (gastroesophageal reflux disease)   . Hyperlipidemia   . Seizures   . Thyroid disease   . Bronchitis, chronic/intermittent 01/22/2012  . Depression 01/22/2012  . IBS (irritable bowel syndrome) 01/22/2012  . Seizures     Past Surgical History  Procedure Date  . Knee surgery 2002/2003    bil    Family History  Problem Relation Age of Onset  . Thyroid disease  Mother   . Cancer Maternal Grandmother     lung  . Cancer Paternal Grandmother     colon/pancreatiec/lymphoma    Social History:  reports that she has never smoked. She does not have any smokeless tobacco history on file. She reports that she does not drink alcohol or use illicit drugs.  Allergies  Allergen Reactions  . Compazine Other (See Comments)    hallucinations  . Tomato Rash    Medications:    Current Facility-Administered Medications  Medication Dose Route Frequency Provider Last Rate Last Dose  . LORazepam (ATIVAN) 2 MG/ML injection        1 mg at 08/26/12 0108  . LORazepam (ATIVAN) injection 1 mg  1 mg Intravenous Once Sunnie Nielsen, MD   1 mg at 08/26/12 4098   Current Outpatient Prescriptions  Medication Sig Dispense Refill  . busPIRone (BUSPAR) 15 MG tablet Take 15 mg by mouth 3 (three) times daily.      . divalproex (DEPAKOTE ER) 500 MG 24 hr tablet Take 500-1,000 mg by mouth daily. Take 1 tablet in the morning Take 2 tablets at bedtime      . fenofibrate micronized (LOFIBRA) 134 MG capsule Take 134 mg by mouth daily before breakfast.      .  lamoTRIgine (LAMICTAL) 200 MG tablet Take 200 mg by mouth daily.      . montelukast (SINGULAIR) 10 MG tablet Take 10 mg by mouth at bedtime.      . rosuvastatin (CRESTOR) 10 MG tablet Take 10 mg by mouth daily.      Marland Kitchen topiramate (TOPAMAX) 200 MG tablet Take 200 mg by mouth daily.      Marland Kitchen acetaminophen (TYLENOL) 500 MG tablet Take 1,000 mg by mouth every 6 (six) hours as needed. For pain      . DISCONTD: divalproex (DEPAKOTE) 500 MG DR tablet Take 500 mg by mouth daily.        Review of Systems - General ROS: negative for - chills, fatigue, fever or hot flashes Hematological and Lymphatic ROS: negative for - bruising, fatigue, jaundice or pallor Endocrine ROS: negative for - hair pattern changes, hot flashes, mood swings or skin changes Respiratory ROS: negative for - cough, hemoptysis, orthopnea or wheezing Cardiovascular ROS:  negative for - dyspnea on exertion, orthopnea, palpitations or shortness of breath Gastrointestinal ROS: negative for - abdominal pain, appetite loss, blood in stools, diarrhea or hematemesis Musculoskeletal ROS: negative for - joint pain, joint stiffness, joint swelling or muscle pain Neurological ROS: positive for - seizures and weakness Dermatological ROS: negative for dry skin, pruritus and rash   Blood pressure 93/50, pulse 85, temperature 99.2 F (37.3 C), temperature source Oral, resp. rate 20, SpO2 97.00%.   Neurologic Examination:  Mental Status: Alert, oriented, thought content appropriate.  Speech fluent without evidence of aphasia.  Able to follow 3 step commands without difficulty. Cranial Nerves: II: Discs flat bilaterally; Visual fields grossly normal, pupils equal, round, reactive to light and accommodation III,IV, VI: ptosis not present, extra-ocular motions intact bilaterally V,VII: smile symmetric, facial light touch sensation normal bilaterally VIII: hearing normal bilaterally IX,X: gag reflex present XI: bilateral shoulder shrug XII: midline tongue extension Motor:  --poor effort Right : Upper extremity   4/5    Left:     Upper extremity   4/5  Lower extremity   4/5     Lower extremity   4/5 Tone and bulk:normal tone throughout; no atrophy noted Sensory: Pinprick and light touch intact throughout, bilaterally Deep Tendon Reflexes: 2+ and symmetric throughout Plantars: Right: downgoing   Left: downgoing Cerebellar: normal finger-to-nose,  normal heel-to-shin test CV: pulses palpable throughout      No results found for this basename: cbc, bmp, coags, chol, tri, ldl, hga1c    Results for orders placed during the hospital encounter of 08/26/12 (from the past 48 hour(s))  VALPROIC ACID LEVEL     Status: Abnormal   Collection Time   08/26/12 12:23 AM      Component Value Range Comment   Valproic Acid Lvl 40.3 (*) 50.0 - 100.0 ug/mL   CBC     Status: Normal     Collection Time   08/26/12 12:23 AM      Component Value Range Comment   WBC 9.5  4.0 - 10.5 K/uL    RBC 3.93  3.87 - 5.11 MIL/uL    Hemoglobin 12.2  12.0 - 15.0 g/dL    HCT 28.4  13.2 - 44.0 %    MCV 92.4  78.0 - 100.0 fL    MCH 31.0  26.0 - 34.0 pg    MCHC 33.6  30.0 - 36.0 g/dL    RDW 10.2  72.5 - 36.6 %    Platelets 315  150 - 400 K/uL  BASIC METABOLIC PANEL     Status: Abnormal   Collection Time   08/26/12 12:23 AM      Component Value Range Comment   Sodium 141  135 - 145 mEq/L    Potassium 3.7  3.5 - 5.1 mEq/L    Chloride 109  96 - 112 mEq/L    CO2 19  19 - 32 mEq/L    Glucose, Bld 100 (*) 70 - 99 mg/dL    BUN 4 (*) 6 - 23 mg/dL    Creatinine, Ser 1.61  0.50 - 1.10 mg/dL    Calcium 8.8  8.4 - 09.6 mg/dL    GFR calc non Af Amer >90  >90 mL/min    GFR calc Af Amer >90  >90 mL/min   POCT PREGNANCY, URINE     Status: Normal   Collection Time   08/26/12  6:15 AM      Component Value Range Comment   Preg Test, Ur NEGATIVE  NEGATIVE     Ct Head Wo Contrast  08/26/2012  *RADIOLOGY REPORT*  Clinical Data: 26 year old female with multiple episodes of seizure like activity.  CT HEAD WITHOUT CONTRAST  Technique:  Contiguous axial images were obtained from the base of the skull through the vertex without contrast.  Comparison: 01/25/2012 and earlier.  Findings: Visualized paranasal sinuses and mastoids are clear.  No acute osseous abnormality identified.  Visualized orbits and scalp soft tissues are within normal limits.  Normal cerebral volume.  No ventriculomegaly. No midline shift, mass effect, or evidence of mass lesion.  Gray-white matter differentiation is within normal limits throughout the brain.  No evidence of cortically based acute infarction identified.  No suspicious intracranial vascular hyperdensity. No acute intracranial hemorrhage identified.  IMPRESSION: Stable and normal noncontrast CT appearance of the brain.   Original Report Authenticated By: Harley Hallmark,  M.D.      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 Patient has been evaluated in a EMU and per family had spells during the  evaluation which were not epileptiform.  Recently she has seen two neuro phsychiatrist with most recent being Dr. Ave Filter. Currently on Topamax, Lamictal and Depakote.  Her Depakote has been recently titrated down. After looking at video, these most likely are nonepileptic.    Recommend: 1) Decrease Depakote to 250mg  BID.  Would not treat with benzos or other anticonvulsants.   2) Would have psychiatry evaluate.   3) Follow up as out patient with Dr. Landis Gandy PA-C [facility] [phone]  08/26/2012, 8:51 AM    Patient seen and examined.  Clinical course and management discussed.  Necessary edits performed.  I agree with the above.  During my evaluation patient had two episodes with multiple features consistent with nonepileptic seizures.  Have explained situation to mother and husband.  They understand that at discharge she will likely be no better than she is now.  They are agreeable after a psych evaluation to have her return to home and see Dr. Ave Filter at her scheduled appointment on Monday.   AM cortisol level to be drawn and prolactin level ordered after episode wile I was in the room.   An hour spent in conversation with family in addition to patient evaluation time. No further neurologic intervention is recommended at this time.  If further questions arise, please call or page at that time.  Thank you for allowing neurology to participate in the care of this patient.  Thana Farr, MD [facility] [phone]  08/26/2012  8:29 PM

## 2012-08-27 MED ORDER — DIVALPROEX SODIUM ER 250 MG PO TB24: 250.0000 mg | ORAL_TABLET | Freq: Two times a day (BID) | ORAL | Status: DC

## 2012-08-27 MED ORDER — DEXTROMETHORPHAN-QUINIDINE 20-10 MG PO CAPS: 1.0000 | ORAL_CAPSULE | Freq: Every day | ORAL | Status: DC

## 2012-08-27 NOTE — Progress Notes (Signed)
Nutrition Brief Note  Patient identified on the Malnutrition Screening Tool (MST) report for weight loss , generating a score of 2.   Wt Readings from Last 10 Encounters:  08/26/12 185 lb (83.915 kg)  01/22/12 170 lb 9.6 oz (77.384 kg)  12/18/11 168 lb (76.204 kg)    Body mass index is 27.32 kg/(m^2). Pt is overweight  based on current BMI.   After discussing with pt, pt has actually had a recent 20# weight gain.  Recent dx of PCOS.  Intake variable.  Does not eat well at times felt secondary to meds.  Currently unable to exercise due to medical status.  Tries to follow a diabetic diet due to weight gain and PCOS dx.  Current diet order is Heart Healthy, patient is consuming approximately 100% of meals at this time. Labs and medications reviewed.   No nutrition interventions warranted at this time. If nutrition issues arise, please consult RD. Discussed need to continue to follow a low fat, low sugar diet.  Oran Rein, RD, LDN Clinical Inpatient Dietitian Pager:  [phone] Weekend and after hours pager:  [phone]

## 2012-08-27 NOTE — Progress Notes (Signed)
Pt had another seizure like episode starting at 10:47-10:58. Per pt mother pt had slight tremors in hands with pt turning head side to side along with moaning. Upon entering rm pt is lethargic only responds to sternal rub. Resp even and unlabored. At 11:01 pt finally able to open eyes but does not respond verbally. 11:03 pt is able to verbally communicate but slow to respond. Will continue to monitor. Johonna Binette A

## 2012-08-27 NOTE — Progress Notes (Signed)
Per Dr. Thad Ranger (neurology) the pt had another episode while she was at the bedside.

## 2012-08-27 NOTE — Progress Notes (Signed)
Pt A/O this am during assessment. Patient continues to have frequent episodes of seizure like activity. All are noted to be different lasting approximietly 8-9 min. Pt mother is at bedside and states that daughter is more lethargic. Patient did appear to be lethargic in between episodes but arousalable. Am med's given. Dr. Eather Colas of activity. Patient is to be seen by Psych today. Patient is stable at this time. Will continue to monitor.

## 2012-08-27 NOTE — Discharge Summary (Signed)
Physician Discharge Summary  Autumn Davis KVQ:[id number] DOB: Dec 24, 1985 DOA: 08/26/2012  PCP: [name], FNP  Admit date: 08/26/2012 Discharge date: 08/27/2012  Discharge Diagnoses:  Principal Problem:  *Convulsions Active Problems:  Hyperlipidemia   Discharge Condition: stable; will follow discharge instructions and will follow with Dr. Ave Filter on 08/30/12 for further evaluation and tx of her condition as an outpatient.  Diet recommendation: heart healthy diet  Filed Weights   08/26/12 0917  Weight: 83.915 kg (185 lb)    History of present illness:  26 year-old female with history of depression and hyperlipidemia was brought to the ER patient was found to have convulsions. Patient's family states that last night at dinnertime she started having facial twitching mostly in the eyelids and eventually started having left-sided the face. This happened multiple times and also started having this removed in the left upper extremity. This was having off and on multiple times, after each episode patient gets drowsy. She did not have any tongue bite or incontinence of urine. She was brought to the ER and on the way again patient had similar episode and was given Ativan by the EMS. She also had similar episode in the ER and was given Ativan again.  Patient was diagnosed with seizures this January and was referred to [facility] by Dr. Pearlean Brownie. Over there patient had prolonged studies for seizures on video monitoring and as per patient's mother the study was negative for seizures. Eventually patient was referred to neuropsychiatric Dr. Ave Filter who at this time is trying to wean her off Depakote.   Hospital Course:  1-Pseudoseizure: normal CK total, normal prolactin level and also normal VS during hospitalization. Per neurology re and psych recommendations; patient will be discharge with follow up with Dr. Ave Filter on 08/30/12, to continue working on weaning her off  antiepileptic medication regimen. 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 Will benefit os especial psycho-therapy as well. Will continue lamictal, topamax and buspar as prescribed prior to admission.  2-HLD: continue statins  3-Hx of migraine: no HA's during this admission; continue topamax.  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  Consultations:  Neuro  psych  Discharge Exam: Filed Vitals:   08/26/12 1945 08/26/12 2048 08/27/12 0551 08/27/12 1500  BP: 113/57 113/75 107/57 102/58  Pulse: 100 91 77 94  Temp:  98.2 F (36.8 C) 98.2 F (36.8 C) 98.4 F (36.9 C)  TempSrc:  Oral Oral Oral  Resp: 20 18 20 20   Height:      Weight:      SpO2: 99% 100% 100% 100%    General: afebrile; AAOX3; just feeling generalized weakness Cardiovascular: S1 and S2; no rubs, murmurs or gallops Respiratory: CTA Neuro: no focal deficit; able to follow commands and answer questions appropriately  Discharge Instructions  Discharge Orders    Future Orders Please Complete By Expires   Diet - low sodium heart healthy      Discharge instructions      Comments:   -Keep yourself well hydrated -Take medications as prescribed -Follow with Dr. Ave Filter on Monday as previously instructed -Follow discharge instructions   Driving Restrictions      Comments:   -no driving; due to pseudoseizure and psycho bulbar disorder       Medication List     As of 08/27/2012  5:16 PM    TAKE these medications         acetaminophen 500 MG tablet   Commonly known as: TYLENOL   Take 1,000 mg by mouth every 6 (six) hours as needed. For pain      busPIRone 15 MG tablet   Commonly known as: BUSPAR   Take 15 mg by mouth 3 (three) times daily.      Dextromethorphan-Quinidine 20-10  MG Caps   Take 1 capsule by mouth daily.      divalproex 250 MG 24 hr tablet   Commonly known as: DEPAKOTE ER   Take 1 tablet (250 mg total) by mouth 2 (two) times daily.      fenofibrate micronized 134 MG capsule   Commonly known as: LOFIBRA   Take 134 mg by mouth daily before breakfast.      lamoTRIgine 200 MG tablet   Commonly known as: LAMICTAL   Take 200 mg by mouth daily.      montelukast 10 MG tablet   Commonly known as: SINGULAIR   Take 10 mg by mouth at bedtime.      rosuvastatin 10 MG tablet   Commonly known as: CRESTOR   Take 10 mg by mouth daily.      topiramate 200 MG tablet   Commonly known as: TOPAMAX   Take 200 mg by mouth daily.           Follow-up Information    Follow up with [name], FNP. In 2 weeks.   Contact information:   [address] [place] [address] [phone]       Follow up with Antonietta Breach, MD. On 08/30/2012.   Contact information:   [address] [place] [address] [phone]           The results of significant diagnostics from this hospitalization (including imaging, microbiology, ancillary and laboratory) are listed below for reference.    Significant Diagnostic Studies: Ct Head Wo Contrast  08/26/2012  *RADIOLOGY REPORT*  Clinical Data: 26 year old female with multiple episodes of seizure like activity.  CT HEAD WITHOUT CONTRAST  Technique:  Contiguous axial images were obtained from the base of the skull through the vertex without contrast.  Comparison: 01/25/2012 and earlier.  Findings: Visualized paranasal sinuses and mastoids are clear.  No acute osseous abnormality identified.  Visualized orbits and scalp soft tissues are within normal limits.  Normal cerebral volume.  No ventriculomegaly. No midline shift, mass effect, or evidence of mass lesion.  Gray-white matter differentiation is within normal limits throughout the brain.  No evidence of cortically based acute infarction  identified.  No suspicious intracranial vascular hyperdensity. No acute intracranial hemorrhage identified.  IMPRESSION: Stable and normal noncontrast CT appearance of the brain.   Original Report Authenticated By: Harley Hallmark, M.D.     Labs: Basic Metabolic Panel:  Lab 08/26/12 5784 08/26/12 0023  NA 141 141  K 3.6 3.7  CL 113* 109  CO2 19 19  GLUCOSE 98 100*  BUN 3* 4*  CREATININE 0.88 0.84  CALCIUM 8.5 8.8  MG -- --  PHOS -- --   CBC:  Lab 08/26/12 0938 08/26/12 0023  WBC 6.7 9.5  NEUTROABS 3.0 --  HGB 11.2* 12.2  HCT 33.6* 36.3  MCV 93.1 92.4  PLT 260 315   Cardiac Enzymes:  Lab 08/26/12 1300  CKTOTAL 64  CKMB 1.2  CKMBINDEX --  TROPONINI --    Time coordinating discharge: >30 minutes  Signed:  Zahirah Cheslock  [facility] 08/27/2012, 5:16 PM

## 2012-08-27 NOTE — Progress Notes (Signed)
Pt had a 8 minute seizure episode that consisted of head shaking from side to side, eye and left cheek twitching, lack of response to verbal stimuli, rolling of the tongue around the mouth, and arm movement. Event began at 1935. Mother and spouse at bedside. Pt was on RA during episode and maintained a pulse ox of 99-100%. HR ranged from 99-108. Once episode was over, Pt was Alert and oriented x4 with stable vitals. (See doc flowsheet). Pt denied pain. Dr. Thad Ranger arrived to pt's room shortly afterwards and was made aware of the episode and description of the pt's movements during and afterwards.

## 2012-08-27 NOTE — Progress Notes (Signed)
Pt had another episode that began at 2100. Client began to arouse from the episode at 2113 but was unable to maintain full alertness. Pt also displayed periods of apnea, which her mother stated she has done prior. However, the pt maintained O2 sats between 98-99% on 2L even during her apneic moments. These moments were followed by a gasp for air. Pt was nonverbal even during her moments of alertness but was capable of nodding to some questions. Mother states the client was unable to speak until approx. 2140.

## 2012-08-27 NOTE — Progress Notes (Signed)
Pt had another episode of seizure like activity with movement of arms and legs and tongue movement from side to side. Patient had three periods of apnec breathing after muscle movement stopped. Patient pupils are sluggish to light. Pt will not respond to name but has eyes open. Activity started at 1319-1335. Pt is now a verbal and c/o pain to neck. Tylenol to be given. Pt stable at this time. Will continue to monitor. Patient able to recall what she had for lunch.

## 2012-08-27 NOTE — Consult Note (Signed)
Patient Identification:  Autumn Davis Date of Evaluation:  08/27/2012 Reason for Consult: Seizures, Depression  Referring Provider: Dr. Gwenlyn Perking History of Present Illness: Pt, Autumn Davis, is awake in bed.  Her mother and father are present.  Pt is married and has been staying with parents.  Mother describes a progressive escalation of symptoms, seizure-like of variable nature, and increasing frequency of events since January 2012.  She had married husband in Dec 2011. In  May, she had dizzy spells that became progressive with symptoms. She would stoop, then be alert.  With progression, she would fall. She would see flack squares or dots. 4-5 mos. Later, she would be 'out'; jerking all over at firs 3-5 min, then 7-12 min.  One was 2 hours with waking and no recall.  Breath holding was observed.  CT Scans dist not reveal any abnormality..She has had one-week 24-hour Video EEG with no result.  She took Depakote at age 13 for 3-5 wks for headache.  She had seen Dr. 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  She then saw Skeet Latch 5-8 times, psychologist.  She had no migraine headaches Jan 2012.  Zoloft was stopped and she began Lamictal 200 mg daily with Buspar 15 mg 3 times daily.  She then went to Dr. Lynnell Grain , a neuropsychiatrist twice and took Risperdal on and off   After her seizures, she was confused, lost short term memory.  On waking, she was disoriented, confused and thought she was 26 yo.  She referred to her husband as 'that man'.  This regression gradually wore off as mother reassured her and oriented her to 'the present time'.   After 'these episodes' she is always regressed to age 82 and it takes time for her to re-orient to the present and the fact that she is married.  There has been no explanation.  Mother says she has always been overprotective and never left children alone - she always stayed for their activities.   Past Psychiatric History: Pt has  depression, no suicide attempts.  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  The repetitious episodes of seizures and her regression to age 1 may be significant for the fact that is ~ time her grandmother who was a very significant figure in her life, died.  She also has a condition of inappropriate laughing and crying.  Symptomatology strongly suggests  She has pseudoseizures.  Past Medical History:     Past Medical History  Diagnosis Date  . GERD (gastroesophageal reflux disease)   . Hyperlipidemia   . Seizures   . Thyroid disease   . Bronchitis, chronic/intermittent 01/22/2012  . Depression 01/22/2012  . IBS (irritable bowel syndrome) 01/22/2012  . Seizures        Past Surgical History  Procedure Date  . Knee surgery 2002/2003    bil    Allergies:  Allergies  Allergen Reactions  . Compazine Other (See Comments)    hallucinations  . Tomato Rash    Current Medications:  Prior to Admission medications   Medication Sig Start Date End Date Taking? Authorizing Provider  busPIRone (BUSPAR) 15 MG tablet Take 15 mg by mouth 3 (three) times daily.   Yes Historical Provider, MD  divalproex (DEPAKOTE ER) 500 MG 24 hr tablet Take 500-1,000 mg by mouth daily. Take 1 tablet in the morning Take 2 tablets at bedtime   Yes Historical Provider, MD  fenofibrate micronized (LOFIBRA)  134 MG capsule Take 134 mg by mouth daily before breakfast.   Yes Historical Provider, MD  lamoTRIgine (LAMICTAL) 200 MG tablet Take 200 mg by mouth daily.   Yes Historical Provider, MD  montelukast (SINGULAIR) 10 MG tablet Take 10 mg by mouth at bedtime.   Yes Historical Provider, MD  rosuvastatin (CRESTOR) 10 MG tablet Take 10 mg by mouth daily.   Yes Historical Provider, MD  topiramate (TOPAMAX) 200 MG tablet Take 200 mg by mouth daily.   Yes Historical Provider, MD  acetaminophen (TYLENOL) 500 MG tablet Take 1,000 mg by mouth every 6 (six) hours as needed. For pain     Historical Provider, MD    Social History:    reports that she has never smoked. She does not have any smokeless tobacco history on file. She reports that she does not drink alcohol or use illicit drugs.   Family History:    Family History  Problem Relation Age of Onset  . Thyroid disease Mother   . Cancer Maternal Grandmother     lung  . Cancer Paternal Grandmother     colon/pancreatiec/lymphoma    Mental Status Examination/Evaluation: Objective:  Appearance: Casual and mildly obese  Psychomotor Activity:  Normal  Eye Contact::  Good  Speech:  Clear and Coherent and Normal Rate  Volume:  Normal  Mood:  Anxious and Depressed  Affect:  Appropriate, Congruent and Depressed  Thought Process:  Coherent  Orientation:  Full  Thought Content:  Worries about progression of symptoms, wants to return to work  Suicidal Thoughts:  No  Homicidal Thoughts:  No  Judgement:  Other:  Altered by anxiety and fear that she will become disabled  Insight:  Lacking and Possibly influenced by a lack of understanding psychological factors influencing these symptoms    DIAGNOSIS:   AXIS I   R/O Pseudoseizures,   AXIS II  Dependent Personality Disorder  AXIS III See medical notes.  AXIS IV economic problems, other psychosocial or environmental problems and problems related to social environment  AXIS V 51-60 moderate symptoms   Assessment/Plan: Discussed with Dr. Gwenlyn Perking Pt and parents are very concerned.  There are possible genetic/psychosocial factors that contribute to presenting symptoms.  Therapy will be valuable as well as continued contact with her Neuropsychiatrist. It is understood that she was very close to her grandmother who spent a lot of time with her from birth until ~ age 72-9 yo.  This may be unresolved bereavement, regression?, to a secure era in her life.  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  This may be a time for her to  seek other positions to minimize this stressful type of education.  She states she has begun to identify with the impairments her students have.  A variety of modalities have been discussed with pt and parents.  She may need intensive psychotherapy, continued evaluation of psychotropic medications as well as alternative modalities are suggested, such as meditation that can be very effective in learning how to self soothe.  Clydine is receptive to treatment and will continue seeing Dr. Lynnell Grain and  hopefully a therapist.  Family is encouraged to discuss referral to [facility] if symptoms do not resolve. RECOMMENDATION:  1.   Pt is taking medications appropriate for seizure and anxiety. 2.  Suggest Addition of Neudexa for pseudobulbar dysfunction.  3.  Pt plans to continue with Neuropsychiatrist and therapist 4.  Pt. Is encouraged to consider a  related yet different focus in teaching that maybe less stressful. 5.  No further psychiatric needs identified.  MD  Psychiatrist signs off Edra Riccardi J. Ferol Luz, MD Psychiatrist  08/27/2012 4:25 PM

## 2013-01-10 ENCOUNTER — Inpatient Hospital Stay (HOSPITAL_COMMUNITY): Payer: BC Managed Care – PPO

## 2013-01-10 ENCOUNTER — Inpatient Hospital Stay (HOSPITAL_COMMUNITY)
Admission: AD | Admit: 2013-01-10 | Discharge: 2013-01-12 | DRG: 247 | Disposition: A | Discharge status: Home / Self Care | Payer: BC Managed Care – PPO | Source: Ambulatory Visit | Attending: Family Medicine | Admitting: Family Medicine

## 2013-01-10 DIAGNOSIS — G8250 Quadriplegia, unspecified: Secondary | ICD-10-CM

## 2013-01-10 DIAGNOSIS — R569 Unspecified convulsions: Secondary | ICD-10-CM | POA: Diagnosis present

## 2013-01-10 DIAGNOSIS — F411 Generalized anxiety disorder: Secondary | ICD-10-CM | POA: Diagnosis present

## 2013-01-10 DIAGNOSIS — R1084 Generalized abdominal pain: Secondary | ICD-10-CM

## 2013-01-10 DIAGNOSIS — Z79899 Other long term (current) drug therapy: Secondary | ICD-10-CM

## 2013-01-10 DIAGNOSIS — F3289 Other specified depressive episodes: Secondary | ICD-10-CM | POA: Diagnosis present

## 2013-01-10 DIAGNOSIS — E876 Hypokalemia: Secondary | ICD-10-CM | POA: Diagnosis present

## 2013-01-10 DIAGNOSIS — R29898 Other symptoms and signs involving the musculoskeletal system: Principal | ICD-10-CM | POA: Diagnosis present

## 2013-01-10 DIAGNOSIS — F329 Major depressive disorder, single episode, unspecified: Secondary | ICD-10-CM | POA: Diagnosis present

## 2013-01-10 DIAGNOSIS — K589 Irritable bowel syndrome without diarrhea: Secondary | ICD-10-CM | POA: Diagnosis present

## 2013-01-10 DIAGNOSIS — E079 Disorder of thyroid, unspecified: Secondary | ICD-10-CM

## 2013-01-10 DIAGNOSIS — J42 Unspecified chronic bronchitis: Secondary | ICD-10-CM

## 2013-01-10 DIAGNOSIS — G43909 Migraine, unspecified, not intractable, without status migrainosus: Secondary | ICD-10-CM | POA: Diagnosis present

## 2013-01-10 DIAGNOSIS — R262 Difficulty in walking, not elsewhere classified: Secondary | ICD-10-CM | POA: Diagnosis present

## 2013-01-10 DIAGNOSIS — M545 Low back pain, unspecified: Secondary | ICD-10-CM

## 2013-01-10 DIAGNOSIS — E785 Hyperlipidemia, unspecified: Secondary | ICD-10-CM | POA: Diagnosis present

## 2013-01-10 DIAGNOSIS — K219 Gastro-esophageal reflux disease without esophagitis: Secondary | ICD-10-CM

## 2013-01-10 DIAGNOSIS — F32A Depression, unspecified: Secondary | ICD-10-CM

## 2013-01-10 LAB — CBC WITH DIFFERENTIAL/PLATELET
Eosinophils Relative: 2 % (ref 0–5)
HCT: 35.4 % — ABNORMAL LOW (ref 36.0–46.0)
Lymphocytes Relative: 46 % (ref 12–46)
Lymphs Abs: 4.5 10*3/uL — ABNORMAL HIGH (ref 0.7–4.0)
MCV: 87.8 fL (ref 78.0–100.0)
Monocytes Absolute: 0.6 10*3/uL (ref 0.1–1.0)
Neutro Abs: 4.5 10*3/uL (ref 1.7–7.7)
Platelets: 355 10*3/uL (ref 150–400)
RBC: 4.03 MIL/uL (ref 3.87–5.11)
WBC: 9.8 10*3/uL (ref 4.0–10.5)

## 2013-01-10 LAB — PROTIME-INR: INR: 1.08 (ref 0.00–1.49)

## 2013-01-10 MED ORDER — ONDANSETRON HCL 4 MG/2ML IJ SOLN
4.0000 mg | Freq: Four times a day (QID) | INTRAMUSCULAR | Status: DC | PRN
  Administered 2013-01-11 (×3): 4 mg via INTRAVENOUS
  Filled 2013-01-10 (×5): qty 2

## 2013-01-10 MED ORDER — DIVALPROEX SODIUM ER 250 MG PO TB24: 250.0000 mg | ORAL_TABLET | Freq: Two times a day (BID) | ORAL | Status: DC

## 2013-01-10 MED ORDER — OXYCODONE-ACETAMINOPHEN 5-325 MG PO TABS
1.0000 | ORAL_TABLET | ORAL | Status: DC | PRN
  Administered 2013-01-10 – 2013-01-11 (×2): 2 via ORAL
  Administered 2013-01-11: 1 via ORAL
  Administered 2013-01-11: 2 via ORAL
  Filled 2013-01-10 (×4): qty 2

## 2013-01-10 MED ORDER — ACETAMINOPHEN 325 MG PO TABS
650.0000 mg | ORAL_TABLET | Freq: Four times a day (QID) | ORAL | Status: DC | PRN
  Administered 2013-01-11 – 2013-01-12 (×3): 650 mg via ORAL
  Filled 2013-01-10 (×3): qty 2

## 2013-01-10 MED ORDER — ACETAMINOPHEN 650 MG RE SUPP: 650.0000 mg | Freq: Four times a day (QID) | RECTAL | Status: DC | PRN

## 2013-01-10 MED ORDER — ENOXAPARIN SODIUM 40 MG/0.4ML ~~LOC~~ SOLN
40.0000 mg | SUBCUTANEOUS | Status: DC
  Administered 2013-01-10 – 2013-01-11 (×2): 40 mg via SUBCUTANEOUS
  Filled 2013-01-10 (×3): qty 0.4

## 2013-01-10 MED ORDER — TOPIRAMATE 100 MG PO TABS
100.0000 mg | ORAL_TABLET | Freq: Two times a day (BID) | ORAL | Status: DC
  Administered 2013-01-10 – 2013-01-12 (×4): 100 mg via ORAL
  Filled 2013-01-10 (×6): qty 1

## 2013-01-10 MED ORDER — BUSPIRONE HCL 15 MG PO TABS
15.0000 mg | ORAL_TABLET | Freq: Three times a day (TID) | ORAL | Status: DC
  Filled 2013-01-10: qty 1

## 2013-01-10 MED ORDER — TOPIRAMATE 100 MG PO TABS: 200.0000 mg | ORAL_TABLET | Freq: Every day | ORAL | Status: DC

## 2013-01-10 MED ORDER — LAMOTRIGINE 200 MG PO TABS
200.0000 mg | ORAL_TABLET | Freq: Every day | ORAL | Status: DC
  Administered 2013-01-11 – 2013-01-12 (×2): 200 mg via ORAL
  Filled 2013-01-10 (×2): qty 1

## 2013-01-10 MED ORDER — ONDANSETRON HCL 4 MG PO TABS: 4.0000 mg | ORAL_TABLET | Freq: Four times a day (QID) | ORAL | Status: DC | PRN

## 2013-01-10 MED ORDER — GADOBENATE DIMEGLUMINE 529 MG/ML IV SOLN
18.0000 mL | Freq: Once | INTRAVENOUS | Status: AC | PRN
  Administered 2013-01-10: 18 mL via INTRAVENOUS

## 2013-01-10 NOTE — Progress Notes (Signed)
Pt tolerated MRI well. Did c/o pain post head repositioning relieved this pain. She did c/o some over all pain states this her normal state over last few months.

## 2013-01-10 NOTE — H&P (Addendum)
[facility] History and Physical  Autumn Davis ZYS:[id number] DOB: [birth year] DOA: 01/10/2013  Referring physician: Dr Pearlean Brownie PCP: [name], FNP  Specialists: Dr [name] - Neurologist.  Chief Complaint: generalized weakness   HPI: Autumn Davis is a 27 y.o. female with prior h/o migraines, pseudo seizures, hyperlipidemia. PCOS, depression, anxiety was sent in from Dr [name] office for generalized weakness over the last few weeks, associated with rigid extremities, urine incontinence, decreased sensation over her right extremity more than left extremity. She denies chest pain, fever,. She reports occasional headaches , more in the  Posterior area of the head. associated with blurry vision lasting for 30 minutes. She  Reports occasional double vision. No hearing deficits.no change in voice. She reports occasional tremors in her hands. Over the last few days, she reports her husband has been carrying her , as she is not able to ambulate. She reports chronic cough that is not responding to multiple courses of z pack. Her travel h[place]o includes [place] of [place]. She denies any sickness during her travel. She reports having flu prior to the travel to [place].  She also reports pain in the right rib cage since one week. She underwent a MRI of the brain last feb and was found to be insignificant. She never had an LP done before. She was sent in from Dr [name] office and is being admitted to hospitalist service for further evaluation. Neurology consult from Dr [name] was request and mRI of the brain and thoracic and cervical spine ordered.    Review of Systems: The patient denies anorexia, fever, weight loss,, , decreased hearing, hoarseness, chest pain, syncope, dyspnea on exertion, peripheral edema, , hemoptysis, abdominal pain, melena, hematochezia, severe indigestion/heartburn, hematuria,  genital sores, , suspicious skin lesions,  unusual weight change,  abnormal bleeding, enlarged lymph nodes, angioedema, and breast masses.    Past Medical History  Diagnosis Date  . GERD (gastroesophageal reflux disease)   . Hyperlipidemia   . Seizures   . Thyroid disease   . Bronchitis, chronic/intermittent 01/22/2012  . Depression 01/22/2012  . IBS (irritable bowel syndrome) 01/22/2012  . Seizures    Past Surgical History  Procedure Laterality Date  . Knee surgery  2002/2003    bil   Social History:  reports that she has never smoked. She does not have any smokeless tobacco history on file. She reports that she does not drink alcohol or use illicit drugs.  where does patient live--home  Allergies  Allergen Reactions  . Compazine Other (See Comments)    hallucinations  . Tomato Rash    Family History  Problem Relation Age of Onset  . Thyroid disease Mother   . Cancer Maternal Grandmother     lung  . Cancer Paternal Grandmother     colon/pancreatiec/lymphoma    Prior to Admission medications   Medication Sig Start Date End Date Taking? Authorizing Provider  acetaminophen (TYLENOL) 500 MG tablet Take 1,000 mg by mouth every 6 (six) hours as needed. For pain    Historical Provider, MD  busPIRone (BUSPAR) 15 MG tablet Take 15 mg by mouth 3 (three) times daily.    Historical Provider, MD  Dextromethorphan-Quinidine (NUEDEXTA) 20-10 MG CAPS Take 1 capsule by mouth daily. 08/27/12   Vassie Loll, MD  divalproex (DEPAKOTE ER) 250 MG 24 hr tablet Take 1 tablet (250 mg total) by mouth 2 (two) times daily. 08/27/12   Vassie Loll, MD  fenofibrate micronized (LOFIBRA) 134 MG capsule  Take 134 mg by mouth daily before breakfast.    Historical Provider, MD  lamoTRIgine (LAMICTAL) 200 MG tablet Take 200 mg by mouth daily.    Historical Provider, MD  montelukast (SINGULAIR) 10 MG tablet Take 10 mg by mouth at bedtime.    Historical Provider, MD  rosuvastatin (CRESTOR) 10 MG tablet Take 10 mg by mouth daily.    Historical Provider, MD  topiramate  (TOPAMAX) 200 MG tablet Take 200 mg by mouth daily.    Historical Provider, MD   Physical Exam: Filed Vitals:   01/10/13 1810  BP: 122/74  Pulse: 96  Temp: 98.6 F (37 C)  Resp: 20  SpO2: 100%    Constitutional: Vital signs reviewed.  Patient is a well-developed and well-nourished in no acute distress and cooperative with exam. Alert and oriented x3.  Head: Normocephalic and atraumatic Mouth: no erythema or exudates, MMM Eyes: PERRL, EOMI, conjunctivae normal, No scleral icterus.  Neck: Supple, Trachea midline normal ROM, No JVD, mass, thyromegaly, or carotid bruit present.  Cardiovascular: RRR, S1 normal, S2 normal, no MRG Pulmonary/Chest: CTAB, no wheezes, rales, or rhonchi Abdominal: Soft. Non-tender, non-distended, bowel sounds are normal, no masses, organomegaly, or guarding present.  Musculoskeletal:  Cervical area tenderness. Tenderness of the right rib area Hematology: no cervical, inginal, or axillary adenopathy.  Neurological: A&O x3, no facial asymmetry. No tongue deviation. Upper extremity strength 1to2/5, lower extremity strength 0/5, decreased sensation to touch on the right > left. Babinski absent. spastic extremities.  Skin: Warm, dry and intact. No rash, cyanosis, or clubbing.  Psychiatric: Normal mood and affect.slow speech. Labs on Admission:  Basic Metabolic Panel: No results found for this basename: NA, K, CL, CO2, GLUCOSE, BUN, CREATININE, CALCIUM, MG, PHOS,  in the last 168 hours Liver Function Tests: No results found for this basename: AST, ALT, ALKPHOS, BILITOT, PROT, ALBUMIN,  in the last 168 hours No results found for this basename: LIPASE, AMYLASE,  in the last 168 hours No results found for this basename: AMMONIA,  in the last 168 hours CBC: No results found for this basename: WBC, NEUTROABS, HGB, HCT, MCV, PLT,  in the last 168 hours Cardiac Enzymes: No results found for this basename: CKTOTAL, CKMB, CKMBINDEX, TROPONINI,  in the last 168 hours  BNP  (last 3 results) No results found for this basename: PROBNP,  in the last 8760 hours CBG: No results found for this basename: GLUCAP,  in the last 168 hours  Radiological Exams on Admission: No results found.    Assessment/Plan Active Problems:    1. generalised weakness, difficulty walking , spastic extremities,  Urinary incontinence, sensory deficits, vision abnormalities: will need to obtain MRI of the brain , spinal cord to evaluate Multiple sclerosis. If mri is not diagnostic  we will consider an LP in am by IR. Neurology on board. Will defer starting IV steroids to neurology Differential include compressive myelitis, cervical transverse myelitis, cerebral vasculitis.  - will get RPR, ANA, dsDNA, hiv antibody, crp. 2. Hyperlipidemia: will obtain lipid panel. Will hold statins for myalgias.  Will get CK level.   3. Migraines: resume home medications.   4. DVT prophylaxis  Code Status: full code Family Communication:mother and husband and family at bedside Disposition Plan: pending.     [facility] Pager [phone]  If 7PM-7AM, please contact night-coverage [email or web address] Password [facility] 01/10/2013, 8:04 PM

## 2013-01-10 NOTE — Progress Notes (Signed)
Pt came from home as a direct admission with upper and lower extremity weakness. Informed the attending physician and did a complete assessment.

## 2013-01-10 NOTE — Consult Note (Signed)
NEURO HOSPITALIST CONSULT NOTE    Reason for Consult: Weakness of arms and legs with urinary incontinence.  HPI:                                                                                                                                          Autumn Davis is an 27 y.o. 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 Patient is also complaining of urinary incontinence over several months. She indicated she's had multiple episodes of quadriparesis over the past 6 months, lasting from 10 minutes to several days. She has been evaluated at [facility] for possible seizure disorder and was determined to have likely pseudoseizures. She was seen at [facility] by Dr. 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 MRI studies are pending.  Past Medical History  Diagnosis Date  . GERD (gastroesophageal reflux disease)   . Hyperlipidemia   . Seizures   . Thyroid disease   . Bronchitis, chronic/intermittent 01/22/2012  . Depression 01/22/2012  . IBS (irritable bowel syndrome) 01/22/2012  . Seizures     Past Surgical History  Procedure Laterality Date  . Knee surgery  2002/2003    bil    Family History  Problem Relation Age of Onset  . Thyroid disease Mother   . Cancer Maternal Grandmother     lung  . Cancer Paternal Grandmother     colon/pancreatiec/lymphoma    Social History:  reports that she has never smoked. She does not have any smokeless tobacco history on file. She reports that she does not drink alcohol or use illicit drugs.  Allergies  Allergen Reactions  . Compazine Other (See Comments)    hallucinations  . Tomato Rash     MEDICATIONS:                                                                                                                     Prior to Admission:  Prescriptions prior to admission  Medication Sig Dispense Refill  . acetaminophen (TYLENOL) 500 MG tablet Take 1,000 mg by  mouth every 6 (six) hours as needed. For pain      . busPIRone (BUSPAR) 15 MG tablet Take 15 mg by mouth 3 (three) times daily.      Marland Kitchen Dextromethorphan-Quinidine (NUEDEXTA) 20-10 MG CAPS Take 1 capsule by mouth daily.  60 capsule  0  . divalproex (DEPAKOTE ER) 250 MG 24 hr tablet Take 1 tablet (250 mg total) by mouth 2 (two) times daily.  60 tablet  0  . fenofibrate micronized (LOFIBRA) 134 MG capsule Take 134 mg by mouth daily before breakfast.      . lamoTRIgine (LAMICTAL) 200 MG tablet Take 200 mg by mouth daily.      . montelukast (SINGULAIR) 10 MG tablet Take 10 mg by mouth at bedtime.      . rosuvastatin (CRESTOR) 10 MG tablet Take 10 mg by mouth daily.      Marland Kitchen topiramate (TOPAMAX) 200 MG tablet Take 200 mg by mouth daily.         Blood pressure 122/74, pulse 96, temperature 98.6 F (37 C), resp. rate 20, SpO2 100.00%.  Neurologic Examination:                                                                                                      Mental Status: Alert, oriented, very flat affect.  Speech fluent without evidence of aphasia. Able to follow commands without difficulty. Cranial Nerves: II-Visual fields were normal. III/IV/VI-Pupils were equal and reacted. Extraocular movements were full and conjugate.    V/VII-no facial numbness and no facial weakness. VIII-normal. X-normal speech. Motor: Report improved with attempts at manual muscle testing. Muscle tone was flaccid throughout. Patient actively resisted range of motion at times. Sensory: Normal throughout. Deep Tendon Reflexes: 2+ and symmetric with no hyperreflexia. Plantars: Mute bilaterally Cerebellar: Commensurate with voluntary movement of  upper extremities. Carotid auscultation: Normal  No results found for this basename: cbc, bmp, coags, chol, tri, ldl, hga1c    No results found for this or any previous visit (from the past 48 hour(s)).  No results found.   Assessment/Plan: 27 year old lady presenting subjective weakness of upper and lower extremities with no clear objective motor deficits. Patient also has no hyperreflexia. I strongly suspect psychophysiologic factors contributing to patient's presenting symptomatology.  Recommendations: MRI of brain, cervical spine and thoracic spine with and without contrast media. If MRI studies unremarkable, psychiatric consultation.  Venetia Maxon,  M.D. [facility] [phone]  01/10/2013, 8:29 PM

## 2013-01-10 NOTE — Progress Notes (Signed)
27 year old lady coming in for direct admission to [facility]N for lower extremity weakness, back pain and neck pain, to evaluate for compressive paresis/ spastic quadriplegia/ Multiple sclerosis. Need MRI of the head and neck . Neurology has been consulted. Please call MD on arrival to the floor.    Kathlen Mody, MD [phone]

## 2013-01-11 LAB — VITAMIN B12: Vitamin B-12: 417 pg/mL (ref 211–911)

## 2013-01-11 LAB — COMPREHENSIVE METABOLIC PANEL
ALT: 12 U/L (ref 0–35)
CO2: 17 mEq/L — ABNORMAL LOW (ref 19–32)
Calcium: 9.4 mg/dL (ref 8.4–10.5)
Chloride: 110 mEq/L (ref 96–112)
GFR calc Af Amer: >90 mL/min (ref >90)
GFR calc non Af Amer: >90 mL/min (ref >90)
Glucose, Bld: 112 mg/dL — ABNORMAL HIGH (ref 70–99)
Sodium: 140 mEq/L (ref 135–145)
Total Bilirubin: 0.3 mg/dL (ref 0.3–1.2)

## 2013-01-11 LAB — URINALYSIS, ROUTINE W REFLEX MICROSCOPIC
Bilirubin Urine: NEGATIVE
Hgb urine dipstick: NEGATIVE
Specific Gravity, Urine: 1.031 — ABNORMAL HIGH (ref 1.005–1.030)
pH: 5.5 (ref 5.0–8.0)

## 2013-01-11 LAB — TSH: TSH: 2.899 u[IU]/mL (ref 0.350–4.500)

## 2013-01-11 LAB — RPR: RPR Ser Ql: NONREACTIVE

## 2013-01-11 LAB — SEDIMENTATION RATE: Sed Rate: 10 mm/hr (ref 0–22)

## 2013-01-11 MED ORDER — TOPIRAMATE 100 MG PO TABS
100.0000 mg | ORAL_TABLET | Freq: Two times a day (BID) | ORAL | Status: DC
  Administered 2013-01-11: 100 mg via ORAL
  Filled 2013-01-11 (×2): qty 1

## 2013-01-11 MED ORDER — PROMETHAZINE HCL 25 MG/ML IJ SOLN
12.5000 mg | Freq: Once | INTRAMUSCULAR | Status: AC
  Administered 2013-01-11: 12.5 mg via INTRAVENOUS
  Filled 2013-01-11 (×2): qty 1

## 2013-01-11 MED ORDER — ACETAZOLAMIDE 250 MG PO TABS
250.0000 mg | ORAL_TABLET | Freq: Once | ORAL | Status: AC
  Administered 2013-01-11: 250 mg via ORAL
  Filled 2013-01-11: qty 1

## 2013-01-11 MED ORDER — TRAMADOL HCL 50 MG PO TABS
50.0000 mg | ORAL_TABLET | Freq: Two times a day (BID) | ORAL | Status: DC
  Administered 2013-01-11 – 2013-01-12 (×3): 50 mg via ORAL
  Filled 2013-01-11 (×6): qty 1

## 2013-01-11 MED ORDER — ACETAZOLAMIDE 250 MG PO TABS
250.0000 mg | ORAL_TABLET | Freq: Three times a day (TID) | ORAL | Status: DC
  Administered 2013-01-11 – 2013-01-12 (×3): 250 mg via ORAL
  Filled 2013-01-11 (×6): qty 1

## 2013-01-11 MED ORDER — FENOFIBRATE 160 MG PO TABS
160.0000 mg | ORAL_TABLET | Freq: Every day | ORAL | Status: DC
  Administered 2013-01-11 – 2013-01-12 (×2): 160 mg via ORAL
  Filled 2013-01-11 (×2): qty 1

## 2013-01-11 MED ORDER — SODIUM CHLORIDE 0.9 % IV SOLN
INTRAVENOUS | Status: DC
  Administered 2013-01-11: 20:00:00 via INTRAVENOUS

## 2013-01-11 MED ORDER — LORAZEPAM 1 MG PO TABS
1.0000 mg | ORAL_TABLET | Freq: Once | ORAL | Status: AC
  Administered 2013-01-11: 1 mg via ORAL
  Filled 2013-01-11: qty 1

## 2013-01-11 MED ORDER — ATORVASTATIN CALCIUM 40 MG PO TABS
40.0000 mg | ORAL_TABLET | Freq: Every day | ORAL | Status: DC
  Filled 2013-01-11: qty 1

## 2013-01-11 NOTE — Evaluation (Signed)
Physical Therapy Evaluation Patient Details Name: Autumn Davis MRN: [id number] DOB: 1986-02-08 Today's Date: 01/11/2013 Time: 1914-7829 PT Time Calculation (min): 54 min  PT Assessment / Plan / Recommendation Clinical Impression  Pt is a 27 yo female admitted for questionable pseudoseizure like activity. Pt with noted high anxiety causing patient's mobility performance to be inconsistent. Pt was noted to have isolated mvmts x 4 extremities despite pt c/o of inability to move x4 extremities. Pt with reports of 10/10 pain t/o body when being transfered at home however did not report that during therapy session. Pt currently requires assist for all transfers, ambulation, and ADLs. Recommend psychiatric consult and CIR. Pt to benefit from CIR to maximize functional recovery to decrease burden of care on family at home.    PT Assessment  Patient needs continued PT services    Follow Up Recommendations  CIR;Supervision/Assistance - 24 hour    Does the patient have the potential to tolerate intense rehabilitation      Barriers to Discharge None      Equipment Recommendations   (TBD)    Recommendations for Other Services  (psychiatric consult)   Frequency Min 3X/week    Precautions / Restrictions Precautions Precautions: Fall Precaution Comments: pt with inconsistent mobility performance Restrictions Weight Bearing Restrictions: No   Pertinent Vitals/Pain 10/10 pain in cervical spine and R flank      Mobility  Bed Mobility Bed Mobility: Rolling Right;Right Sidelying to Sit;Sit to Sidelying Right Rolling Right: 1: +2 Total assist Rolling Right: Patient Percentage: 60% Right Sidelying to Sit: 1: +2 Total assist;With rails;HOB flat Right Sidelying to Sit: Patient Percentage: 80% Details for Bed Mobility Assistance: pt benefited from 2 person assist to decrease anxiety Pt required significant increase in time, constant v/c's of positive encouragement and tactile  guidance Transfers Transfers: Sit to Stand;Stand to Sit Sit to Stand: 1: +2 Total assist;With upper extremity assist;From bed Sit to Stand: Patient Percentage: 80% Stand to Sit: To chair/3-in-1;With upper extremity assist;1: +2 Total assist Stand to Sit: Patient Percentage: 80% Details for Transfer Assistance: pt benefitted from 2 person assist to decreased anxiety Pt required significant increase in time, constant verbal cues, guidance and encouragement Ambulation/Gait Ambulation/Gait Assistance: 1: +2 Total assist Ambulation/Gait: Patient Percentage: 80% Ambulation Distance (Feet): 40 Feet Assistive device: 2 person hand held assist Ambulation/Gait Assistance Details: max directional v/c's, pt with inconsistent ability to advance LEs, despite max directional v/c's pt unable to sequence increased UE WBing with advancing LEs. Pt with intermittent and inconsistent use of UEs. Pt unable to clear foot and was dragging toes across floor. pt required significant increased in time. pt with  noted increased anxiety t/o amb requires constant calming, directional, and encouraging verbal cues. Gait Pattern: Step-to pattern;Decreased hip/knee flexion - right;Decreased hip/knee flexion - left;Decreased dorsiflexion - right;Decreased dorsiflexion - left;Narrow base of support Gait velocity: extremely slow Stairs: No    Exercises     PT Diagnosis:    PT Problem List: Decreased strength;Decreased range of motion;Decreased activity tolerance;Decreased balance;Decreased mobility;Decreased coordination;Decreased cognition PT Treatment Interventions: Gait training;Stair training;Functional mobility training;Therapeutic activities;Therapeutic exercise;Balance training;Neuromuscular re-education   PT Goals Acute Rehab PT Goals PT Goal Formulation: With patient/family Time For Goal Achievement: 01/25/13 Potential to Achieve Goals: Good Pt will Roll Supine to Right Side: with modified independence PT Goal:  Rolling Supine to Right Side - Progress: Goal set today Pt will go Supine/Side to Sit: with modified independence PT Goal: Supine/Side to Sit - Progress: Goal set today Pt will  go Sit to Supine/Side: with modified independence PT Goal: Sit to Supine/Side - Progress: Goal set today Pt will Transfer Bed to Chair/Chair to Bed: with min assist PT Transfer Goal: Bed to Chair/Chair to Bed - Progress: Goal set today Pt will Ambulate: 51 - 150 feet;with min assist;with least restrictive assistive device PT Goal: Ambulate - Progress: Goal set today Pt will Go Up / Down Stairs: 1-2 stairs;with mod assist;with +2 total assist PT Goal: Up/Down Stairs - Progress: Goal set today  Visit Information  Last PT Received On: 01/11/13 Assistance Needed: +2 PT/OT Co-Evaluation/Treatment: Yes    Subjective Data  Subjective: Pt received supine in bed with c/o "i'm hurting" Patient Stated Goal: walk   Prior Functioning  Home Living Lives With: Family (parents and spouse) Available Help at Discharge: Family;Available 24 hours/day Type of Home: House Home Access: Stairs to enter [employer] of Steps: 2 Entrance Stairs-Rails: None Home Layout: One level Bathroom Shower/Tub: Health visitor: Standard (pushes up from tub) Bathroom Accessibility: Yes How Accessible: Accessible via walker Home Adaptive Equipment: Wheelchair - manual Prior Function Level of Independence: Needs assistance Needs Assistance: Bathing;Dressing;Toileting;Meal Prep;Gait;Transfers Bath: Minimal Dressing: Minimal Toileting: Minimal Meal Prep: Total Gait Assistance: bilat HHA Transfer Assistance: minimal/moderate Able to Take Stairs?:  (with assist) Driving: No Vocation: Unemployed Communication Communication: No difficulties Dominant Hand: Right    Cognition  Cognition Overall Cognitive Status: Appears within functional limits for tasks assessed/performed Arousal/Alertness:  Awake/alert Orientation Level:  (pt reports "i never know what day it is.") Behavior During Session: Anxious    Extremity/Trunk Assessment Right Upper Extremity Assessment RUE ROM/Strength/Tone:  (see OT note) Left Upper Extremity Assessment LUE ROM/Strength/Tone:  (see OT note) Right Lower Extremity Assessment RLE ROM/Strength/Tone: Unable to fully assess (pt with inconsistent presentation, pt with noted isolated mvmts through asked tasks but during MMT test at 3-/5 Left Lower Extremity Assessment LLE ROM/Strength/Tone: Unable to fully assess same at R LE Trunk Assessment: Normal   Balance Balance Balance Assessed: Yes Static Sitting Balance Static Sitting - Balance Support: Right upper extremity supported;Left upper extremity supported Static Sitting - Level of Assistance: 5: Stand by assistance Static Sitting - Comment/# of Minutes: 5  End of Session PT - End of Session Equipment Utilized During Treatment: Gait belt Activity Tolerance: Patient tolerated treatment well Patient left: in chair;with family/visitor present;with call bell/phone within reach Nurse Communication: Mobility status  GP     Marcene Brawn 01/11/2013, 3:42 PM  Lewis Shock, PT, DPT Pager #: [phone] Office #: [phone]

## 2013-01-11 NOTE — Progress Notes (Signed)
Rehab Admissions Coordinator Note:  Patient was screened by Clois Dupes for appropriateness for an Inpatient Acute Rehab Consult.  At this time, we are recommending await medical workup to determine most appropriate rehab venue options. I will follow .  Clois Dupes 01/11/2013, 3:59 PM  I can be reached at [phone].

## 2013-01-11 NOTE — Evaluation (Signed)
Occupational Therapy Evaluation Patient Details Name: Autumn Davis MRN: [id number] DOB: December 16, 1985 Today's Date: 01/11/2013 Time: 4098-1191 OT Time Calculation (min): 54 min  OT Assessment / Plan / Recommendation Clinical Impression  This 27 y.o. female admitted with progressive weakness bil. UEs and LEs.  MRI's and work up negative to date.  Pt. presenents to OT with the below listed deficits.  She did demonstrate inconsistencies throughout session, and seemed to have more active movement bil. UEs, and LEs when she was distracted and not focused on what she can and can't do.  Pt. demonstrates isolated movement to some degree all joints/mvts bil. UEs, but unable to demonstrate full movement on command.  Pt. will benefit from continued OT to maximize safety and independence with BADLs to allow pt to return home with family and min A to supervision level.  May benefit from CIR    OT Assessment  Patient needs continued OT Services    Follow Up Recommendations  CIR;Supervision/Assistance - 24 hour    Barriers to Discharge None    Equipment Recommendations  3 in 1 bedside comode;Tub/shower bench    Recommendations for Other Services Rehab consult  Frequency  Min 2X/week    Precautions / Restrictions Precautions Precautions: Fall Precaution Comments: pt with inconsistent mobility performance Restrictions Weight Bearing Restrictions: No       ADL  Eating/Feeding: Moderate assistance Where Assessed - Eating/Feeding: Bed level;Chair Grooming: Wash/dry hands;Wash/dry face;Teeth care;Moderate assistance Where Assessed - Grooming: Supported sitting;Supine, head of bed up Upper Body Bathing: Maximal assistance Where Assessed - Upper Body Bathing: Supine, head of bed up Lower Body Bathing: +1 Total assistance Where Assessed - Lower Body Bathing: Supported sit to stand Upper Body Dressing: Maximal assistance Where Assessed - Upper Body Dressing: Unsupported sitting Lower Body  Dressing: +1 Total assistance Where Assessed - Lower Body Dressing: Supported sit to Art therapist Transfer: +2 Total assistance Toilet Transfer: Patient Percentage: 80% Toilet Transfer Method: Stand pivot;Sit to Barista: Bedside commode Toileting - Clothing Manipulation and Hygiene: +1 Total assistance Where Assessed - Engineer, mining and Hygiene: Standing Tub/Shower Transfer: +1 Total assistance (Pt unable) Equipment Used: Gait belt Transfers/Ambulation Related to ADLs: Pt ambulated with total A +2 HHA (pt ~80%).  Pt. slow to move, and requires max encouragement ADL Comments: Pt responds very well to encouragement    OT Diagnosis: Generalized weakness;Acute pain  OT Problem List: Decreased strength;Decreased range of motion;Decreased activity tolerance;Decreased coordination;Decreased knowledge of use of DME or AE;Impaired UE functional use;Pain OT Treatment Interventions: Self-care/ADL training;Therapeutic exercise;DME and/or AE instruction;Therapeutic activities;Patient/family education   OT Goals Acute Rehab OT Goals OT Goal Formulation: With patient/family Time For Goal Achievement: 01/18/13 Potential to Achieve Goals: Good ADL Goals Pt Will Perform Upper Body Bathing: with supervision;Sit to stand from chair;Sit to stand from bed ADL Goal: Upper Body Bathing - Progress: Goal set today Pt Will Perform Lower Body Bathing: with supervision;Sit to stand from chair;Sit to stand from bed ADL Goal: Lower Body Bathing - Progress: Goal set today Pt Will Perform Upper Body Dressing: with supervision;Sit to stand from chair;Sit to stand from bed ADL Goal: Upper Body Dressing - Progress: Goal set today Pt Will Perform Lower Body Dressing: with supervision;Sit to stand from chair;Sit to stand from bed ADL Goal: Lower Body Dressing - Progress: Goal set today Pt Will Transfer to Toilet: with supervision;Ambulation;Comfort height toilet ADL Goal: Toilet  Transfer - Progress: Goal set today Pt Will Perform Toileting - Clothing Manipulation: with supervision;Standing  ADL Goal: Toileting - Clothing Manipulation - Progress: Goal set today Pt Will Perform Tub/Shower Transfer: with supervision;Ambulation;Transfer tub bench ADL Goal: Tub/Shower Transfer - Progress: Goal set today  Visit Information  Last OT Received On: 01/11/13 Assistance Needed: +2    Subjective Data  Subjective: "I just can't do anything with my hands" Patient Stated Goal: To walk again   Prior Functioning     Home Living Lives With: Family (parents and spouse) Available Help at Discharge: Family;Available 24 hours/day Type of Home: House Home Access: Stairs to enter [employer] of Steps: 2 Entrance Stairs-Rails: None Home Layout: One level Bathroom Shower/Tub: Health visitor: Standard (pushes up from tub) Bathroom Accessibility: Yes How Accessible: Accessible via walker Home Adaptive Equipment: Wheelchair - manual Prior Function Level of Independence: Needs assistance Needs Assistance: Bathing;Dressing;Toileting;Meal Prep;Gait;Transfers Bath: Minimal Dressing: Minimal Toileting: Minimal Meal Prep: Total Gait Assistance: bilat HHA Transfer Assistance: minimal/moderate Able to Take Stairs?:  (with assist) Driving: No Vocation: Unemployed Comments: 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 Communication Communication: No difficulties Dominant Hand: Right         Vision/Perception Vision - Assessment Vision Assessment: Vision not tested Perception Perception: Within Functional Limits Praxis Praxis: Intact   Cognition  Cognition Overall Cognitive Status: Appears within functional limits for tasks assessed/performed Arousal/Alertness: Awake/alert Orientation Level: Appears intact for tasks assessed Behavior During Session: Anxious    Extremity/Trunk Assessment Right Upper  Extremity Assessment RUE ROM/Strength/Tone: Deficits RUE ROM/Strength/Tone Deficits: Pt demonstrates very limited shoulder flexion when asked, Rt. eblow to mouth actively, and not finger extension.  She reports tightness and rigidity.  As PROM performed, pt noted to assist with all aspects of ROM, all joints.  Pt. with initial tightness, that immediately dissipated.  When ambulating, pt reached for, and grabbed therapist's arm to hold on, demonstrated active flex/ext of digits (Pt keep fingers flexed at PIP joints, extended at MCPs) RUE Sensation: WFL - Light Touch RUE Coordination: Deficits RUE Coordination Deficits: see above comments Left Upper Extremity Assessment LUE ROM/Strength/Tone: Deficits LUE ROM/Strength/Tone Deficits: Pt demonstrates very limited shoulder flexion when asked, Rt. eblow to mouth actively, and not finger extension.  She reports tightness and rigidity.  As PROM performed, pt noted to assist with all aspects of ROM, all joints.  Pt. with initial tightness, that immediately dissipated.  When ambulating, pt reached for, and grabbed therapist's arm to hold on, demonstrated active flex/ext of digits (Pt maintains hands with PIP flexion and MCPs extended) LUE Sensation: WFL - Light Touch LUE Coordination: Deficits LUE Coordination Deficits: see above notes Right Lower Extremity Assessment RLE ROM/Strength/Tone: Unable to fully assess (pt with inconsistent presentation ) Left Lower Extremity Assessment LLE ROM/Strength/Tone: Unable to fully assess (pt with inconsistent presentation ) Trunk Assessment Trunk Assessment: Normal     Mobility Bed Mobility Bed Mobility: Rolling Right;Right Sidelying to Sit;Sit to Sidelying Right Rolling Right: 1: +2 Total assist Rolling Right: Patient Percentage: 60% Right Sidelying to Sit: 1: +2 Total assist;With rails;HOB flat Right Sidelying to Sit: Patient Percentage: 80% Details for Bed Mobility Assistance: pt benefited from 2 person  assist to decrease anxiety Transfers Sit to Stand: 1: +2 Total assist;With upper extremity assist;From bed Sit to Stand: Patient Percentage: 80% Stand to Sit: To chair/3-in-1;With upper extremity assist;1: +2 Total assist Stand to Sit: Patient Percentage: 80% Details for Transfer Assistance: pt benefitted from 2 person assist to decreased anxiety     Exercise  Balance Balance Balance Assessed: Yes Static Sitting Balance Static Sitting - Balance Support: Right upper extremity supported;Left upper extremity supported Static Sitting - Level of Assistance: 5: Stand by assistance Static Sitting - Comment/# of Minutes: 5   End of Session OT - End of Session Equipment Utilized During Treatment: Gait belt Activity Tolerance: Patient tolerated treatment well Patient left: in chair;with call bell/phone within reach;with family/visitor present Nurse Communication: Mobility status  GO     Katriona Schmierer M 01/11/2013, 5:28 PM

## 2013-01-11 NOTE — Progress Notes (Signed)
Patient was able to ambulate from chair to bedside commode and into bed with moderate assist.   This is a remarkable improvement from her early state where she could barely move all 4 extremities.

## 2013-01-11 NOTE — Progress Notes (Signed)
Subjective: Continues to feel weak.   Her previous spells which were found to be non-epileptic at wake were described as left arm flailing with unresponsiveness. More recently, she has been having spells similar to the current one, however they have only lasted 10 - 15 minutes previously.   Exam: Filed Vitals:   01/11/13 1000  BP: 107/70  Pulse: 77  Temp: 97.6 F (36.4 C)  Resp: 20   Gen: In bed, NAD MS: Awake, Alert, oriented.  WU:[id number], EOMI, face symmetric.  Motor: Tone is normal in upper extremities. She intially resists movement, but then gives way. She actively resists movemetn in lower extremities, I do not feel that there is any true spasticity, though it is difficult to be certain given her resistence. She appears to give little effort when attempting to lift her legs. When repositioning her into a sitting position, she seems to have more strength.  Sensory: intact to LT DTR:1+ at the biceps and knees. No hyperreflexia.   Impression: 27 yo F with increased muscle "tightness" and percieved weakness. On exam, I do not have any definitive objective findings to confirm her complaints. Her normal MRI brain and spine essentially rules out multiple sclerosis or transverse myelitis. 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 With the increased muscle "tightness" two possible though very unlikely considerations would be a disease on the neuromyotonia spectrum(with many having VGKC antibodies) or gad associated stiffness.   Recommendations: 1) VGKC ab and gad ab 2) Would encourage improvement, may benefit from PT.   Ritta Slot, MD [facility] [phone]  If 7pm- 7am, please page neurology on call at [phone].

## 2013-01-11 NOTE — Progress Notes (Signed)
PROGRESS NOTE  Autumn Davis ZOX:[id number] DOB: Sep 29, 1986 DOA: 01/10/2013 PCP: [name], FNP  Brief narrative: 27 yr old female admitted with migraines, pseudo-sz admitted from Dr. Charlotte Sanes office 2.24.14 c Gen weakness rigidity/headaches + blurred vision.    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  nasueous and vomits in room Weakn in LE's but able to shake my havne and lift the vomit container Mother and husband in room   Objective    Interim History:  none  Telemetry: nad  Objective: Filed Vitals:   01/10/13 2200 01/11/13 0200 01/11/13 0600 01/11/13 1000  BP: 125/82 117/78 93/53 107/70  Pulse: 101 94 92 77  Temp: 98.2 F (36.8 C) 97.4 F (36.3 C) 97.2 F (36.2 C) 97.6 F (36.4 C)  TempSrc: Oral Oral Oral Oral  Resp: 20 20 20 20   Height: 5\' 8"  (1.727 m)     Weight: 80.4 kg (177 lb 4 oz)     SpO2: 100% 100% 100% 100%    Intake/Output Summary (Last 24 hours) at 01/11/13 1640 Last data filed at 01/10/13 2100  Gross per 24 hour  Intake      0 ml  Output    300 ml  Net   -300 ml    Exam:  General: alert somewhat sleepy Cardiovascular: s1 s2 no m/r/g Respiratory: clear Abdomen:  soft [place] moves upper extremities somewhat well.  Trace to flicker movement of lower extremities.  Reflexes depressed bilaterally  Data Reviewed: Basic Metabolic Panel:  Recent Labs Lab 01/10/13 2317  NA 140  K 2.9*  CL 110  CO2 17*  GLUCOSE 112*  BUN 9  CREATININE 0.84  CALCIUM 9.4  MG 2.2  PHOS 3.4   Liver Function Tests:  Recent Labs Lab 01/10/13 2317  AST 16  ALT 12  ALKPHOS 54  BILITOT 0.3  PROT 6.7  ALBUMIN 3.8   No results found for this basename: LIPASE, AMYLASE,  in the  last 168 hours No results found for this basename: AMMONIA,  in the last 168 hours CBC:  Recent Labs Lab 01/10/13 2317  WBC 9.8  NEUTROABS 4.5  HGB 12.1  HCT 35.4*  MCV 87.8  PLT 355   Cardiac Enzymes:  Recent Labs Lab 01/10/13 2317 01/11/13 1108  CKTOTAL 54 56   BNP: No components found with this basename: POCBNP,  CBG: No results found for this basename: GLUCAP,  in the last 168 hours  No results found for this or any previous visit (from the past 240 hour(s)).   Studies:              All Imaging reviewed and is as per above notation   Scheduled Meds: . acetaZOLAMIDE  250 mg Oral TID  . enoxaparin (LOVENOX) injection  40 mg Subcutaneous Q24H  . fenofibrate  160 mg Oral Daily  . lamoTRIgine  200 mg Oral Daily  . topiramate  100 mg Oral BID  . traMADol  50 mg Oral Q12H   Continuous Infusions:    Assessment/Plan: 1. 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 She has had workup of this performed in the past to some extent 08/26/2012. I did offer that this may be a conversion disorder  with patient manifesting with these symptoms. Patient's mother tends to agree and thinks that patient may benefit from outpatient therapy. I did offer psychiatry consultation in the hospital but they would like to defer setting "that Dr. fell asleep when she was seeing me". At this point we will follow neurology recommendations and hope that patient does get better-patient has no plaques or deformities on MRI/A. of the brain patient will not need lumbar puncture 2. History migraines continue Lamictal 200 daily, Topamax 100 twice a day 3. Nausea vomiting-potentially related to Percocet. I will discontinue this as well as patient's cholesterol medication at present time  Code Status: Full Family Communication: Discussed with her mother/husband at bedside Disposition Plan: Pending   Pleas Koch, MD  [facility] Pager [phone] 01/11/2013, 4:40 PM    LOS: 1 day

## 2013-01-12 ENCOUNTER — Encounter (HOSPITAL_COMMUNITY): Payer: Self-pay

## 2013-01-12 DIAGNOSIS — R569 Unspecified convulsions: Secondary | ICD-10-CM

## 2013-01-12 DIAGNOSIS — F329 Major depressive disorder, single episode, unspecified: Secondary | ICD-10-CM

## 2013-01-12 DIAGNOSIS — F3289 Other specified depressive episodes: Secondary | ICD-10-CM

## 2013-01-12 LAB — ANA: Anti Nuclear Antibody(ANA): NEGATIVE

## 2013-01-12 MED ORDER — ACETAZOLAMIDE 250 MG PO TABS
125.0000 mg | ORAL_TABLET | Freq: Three times a day (TID) | ORAL | Status: DC
  Administered 2013-01-12: 125 mg via ORAL
  Filled 2013-01-12 (×2): qty 1

## 2013-01-12 MED ORDER — POTASSIUM CHLORIDE ER 10 MEQ PO TBCR: 20.0000 meq | EXTENDED_RELEASE_TABLET | Freq: Every day | ORAL | Status: DC

## 2013-01-12 MED ORDER — TRAMADOL HCL 50 MG PO TABS: 50.0000 mg | ORAL_TABLET | Freq: Two times a day (BID) | ORAL | Status: DC

## 2013-01-12 MED ORDER — POTASSIUM CHLORIDE CRYS ER 20 MEQ PO TBCR
40.0000 meq | EXTENDED_RELEASE_TABLET | Freq: Once | ORAL | Status: AC
  Administered 2013-01-12: 40 meq via ORAL
  Filled 2013-01-12 (×2): qty 2

## 2013-01-12 NOTE — Consult Note (Cosign Needed)
Celine Mans Orland Penman EdD

## 2013-01-12 NOTE — Care Management Note (Signed)
    Page 1 of 2   01/12/2013     1:47:20 PM   CARE MANAGEMENT NOTE 01/12/2013  Patient:  Autumn Davis, Autumn Davis   Account Number:  [id number]  Date Initiated:  01/11/2013  Documentation initiated by:  [facility]  Subjective/Objective Assessment:   admitted with weakness, double vision, difficulty walking, urinary incontinence     Action/Plan:   PT/OT evals-   Anticipated DC Date:  01/12/2013   Anticipated DC Plan:  HOME W HOME HEALTH SERVICES      DC Planning Services  CM consult      Choice offered to / List presented to:  C-1 Patient   DME arranged  3-N-1      DME agency  [facility]     HH arranged  HH-2 PT  HH-3 OT      [facility] agency  [facility]   Status of service:  Completed, signed off Medicare Important Message given?   (If response is "NO", the following Medicare IM given date fields will be blank) Date Medicare IM given:   Date Additional Medicare IM given:    Discharge Disposition:  HOME W HOME HEALTH SERVICES  Per UR Regulation:  Reviewed for med. necessity/level of care/duration of stay  If discussed at Long Length of Stay Meetings, dates discussed:    Comments:  01/12/13 Spoke with patient and her mother about HHC. They selected [facility], contacted Marie at [facility] and set up HHPT and HHOT. Leslye Peer the address for patient's mother where patient will be staying. Contacted Justin at [facility] for 3N1 to be delivered to patient's room prior to d/c. Jacquelynn Cree RN, BSN, CCM

## 2013-01-12 NOTE — Progress Notes (Signed)
Physical Therapy Treatment Patient Details Name: Autumn Davis MRN: [id number] DOB: 25-Dec-1985 Today's Date: 01/12/2013 Time: 4742-5956 PT Time Calculation (min): 54 min  PT Assessment / Plan / Recommendation Comments on Treatment Session  Patient tolerated mobility with less reports of pain.  Did have one episode of nausea that was fleeting, but although slow and tedious able to do more for herself.  Did educate mother in how to bump wheelchair up steps for home entry.    Follow Up Recommendations  Home health PT;Supervision/Assistance - 24 hour     Does the patient have the potential to tolerate intense rehabilitation   N/A  Barriers to Discharge  None      Equipment Recommendations  Wheelchair (measurements PT);Wheelchair cushion (measurements PT);Rolling walker with 5" wheels (3:1; educated mom where to get gait belt; 18x18 lightweight)    Recommendations for Other Services  None  Frequency   3x/week  Plan Discharge plan needs to be updated    Precautions / Restrictions Precautions Precautions: Fall   Pertinent Vitals/Pain Legs painful with movement/weight bearing    Mobility  Bed Mobility Bed Mobility: Sitting - Scoot to Edge of Bed Rolling Right: 4: Min assist;With rail Right Sidelying to Sit: 3: Mod assist;With rails;HOB elevated Sitting - Scoot to Edge of Bed: 4: Min guard Details for Bed Mobility Assistance: increased time required, mod cues and encouragement throughout; able to perform reciprocal scooting with little cues Transfers Transfers: Stand Pivot Transfers Sit to Stand: 3: Mod assist;From bed;From chair/3-in-1;With upper extremity assist Stand to Sit: 4: Min assist;With upper extremity assist;To chair/3-in-1 Stand Pivot Transfers: 3: Mod assist Details for Transfer Assistance: max cues for technique with anterior weight shift; weight acceptance through feet; and use of UE's to assist; initial facilitation for feet flat with internal tibial rotation  bilaterally; stand pivot to chair with walker and cues for technique Wheelchair Mobility Wheelchair Mobility: Yes Wheelchair Assistance: 4: Min Best boy: Both upper extremities Wheelchair Parts Management: Needs assistance Distance: 20' with cues for technique; slow and inefficient pushes due to pain with reaching too far up on wheel;  Did educate mother in wheelchair parts and collapse and opening chair    Exercises General Exercises - Lower Extremity Ankle Circles/Pumps: AAROM;Both;10 reps;Supine Heel Slides: AAROM;Both;5 reps;Supine Hip ABduction/ADduction: AAROM;Both;5 reps;Supine (with hip and knee bent)      PT Goals Acute Rehab PT Goals Pt will Roll Supine to Right Side: with modified independence PT Goal: Rolling Supine to Right Side - Progress: Progressing toward goal Pt will go Supine/Side to Sit: with modified independence PT Goal: Supine/Side to Sit - Progress: Progressing toward goal Pt will Transfer Bed to Chair/Chair to Bed: with min assist PT Transfer Goal: Bed to Chair/Chair to Bed - Progress: Progressing toward goal  Visit Information  Last PT Received On: 01/12/13 Assistance Needed: +1    Subjective Data  Subjective: Reports up to [facility] once last PM then nauseous so used bedpan.   Cognition  Cognition Overall Cognitive Status: Appears within functional limits for tasks assessed/performed Arousal/Alertness: Awake/alert Orientation Level: Appears intact for tasks assessed Behavior During Session: Anxious Cognition - Other Comments: cues for breathing throughout mobility     Balance  Static Sitting Balance Static Sitting - Balance Support: Feet supported;No upper extremity supported Static Sitting - Level of Assistance: 6: Modified independent (Device/Increase time)  End of Session PT - End of Session Equipment Utilized During Treatment: Gait belt Activity Tolerance: Patient tolerated treatment well Patient left: in chair;with  family/visitor  present   GP     [facility] 01/12/2013, 2:53 PM [place], [place] [phone] 01/12/2013

## 2013-01-12 NOTE — Progress Notes (Signed)
Physician Discharge Summary   Autumn Davis WUJ:[id number] DOB: [birth year] DOA: 01/10/2013  PCP: [name], FNP  Admit date: 01/10/2013  Discharge date: 01/12/2013  Time spent: 50 minutes  Recommendations for Outpatient Follow-up:  Follow up PCP in 2 weeks  Followup Dr. Pearlean Brownie in 2 weeks  Call Dr. [name] office to discuss the lab results  Discharge Diagnoses:  Active Problems:  Hyperlipidemia  Quadriplegia   Discharge Condition: Stable  Diet recommendation: Regular diet  Filed Weights    01/10/13 2200   Weight:  80.4 kg (177 lb 4 oz)    History of present illness:  27 y.o. female with prior h/o migraines, pseudo seizures, hyperlipidemia. PCOS, depression, anxiety was sent in from Dr [name] office for generalized weakness over the last few weeks, associated with rigid extremities, urine incontinence, decreased sensation over her right extremity more than left extremity. She denies chest pain, fever,. She reports occasional headaches , more in the Posterior area of the head. associated with blurry vision lasting for 30 minutes. She Reports occasional double vision. No hearing deficits.no change in voice. She reports occasional tremors in her hands. Over the last few days, she reports her husband has been carrying her , as she is not able to ambulate. She reports chronic cough that is not responding to multiple courses of z pack. Her travel h[place]o includes travel [place] of [place]. She denies any sickness during her travel. She reports having flu prior to the travel to [place].  She also reports pain in the right rib cage since one week. She underwent a MRI of the brain last feb and was found to be insignificant. She never had an LP done before. She was sent in from Dr [name] office and is being admitted to hospitalist service for further evaluation. Neurology consult from Dr [name] was request and mRI of the brain and thoracic and cervical spine ordered.  Hospital  Course:  Muscle weakness  Patient has Long-standing history of pseudoseizures, anxiety, depression, hyperlipidemia, muscle weakness. She was admitted from Dr. [name] office for further evaluation. Patient was seen by neurology, MRI brain, cervical spine and thoracic spine were ordered which were all negative. 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 Patient will followup with her neurologist as outpatient.  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 Pearlean Brownie. [facility] (primary neurologist)  We will continue the patient on Topamax, Lamictal , Diamox.  We'll give her Tramadol twice a day for pain  Hypokalemia  will check the potassium and replace it before discharge  Procedures:him him him him  None Consultations:  Neurology Discharge Exam:  Filed Vitals:    01/11/13 2152  01/12/13 0602  01/12/13 0700  01/12/13 0902   BP:  111/61  96/55   111/64   Pulse:  86  111   102   Temp:  97.5 F (36.4 C)  97.9 F (36.6 C)   98.2 F (36.8 C)   TempSrc:  Oral    Oral   Resp:  16  18   20    Height:       Weight:       SpO2:  100%  98%  97%  97%    General: Appearing no acute distress  Cardiovascular: S1-S2 regular  Respiratory: clear to auscultation bilaterally  extremities: No edema, continued muscle weakness  Discharge Instructions  Discharge Orders    Future Orders  Complete By  Expires     Diet general  As directed      Increase activity slowly  As directed          Medication List     TAKE these medications       acetaZOLAMIDE 250 MG tablet    Commonly known as: DIAMOX    Take 250 mg by mouth 3 (three) times daily.    fenofibrate micronized 134 MG capsule    Commonly known as: LOFIBRA    Take 134 mg by mouth at bedtime.    lamoTRIgine 200 MG tablet    Commonly known as: LAMICTAL    Take 200 mg by mouth daily.    montelukast  10 MG tablet    Commonly known as: SINGULAIR    Take 10 mg by mouth at bedtime.    rosuvastatin 10 MG tablet    Commonly known as: CRESTOR    Take 10 mg by mouth at bedtime.    topiramate 100 MG tablet    Commonly known as: TOPAMAX    Take 100 mg by mouth 2 (two) times daily.    traMADol 50 MG tablet    Commonly known as: ULTRAM    Take 1 tablet (50 mg total) by mouth every 12 (twelve) hours.          Follow-up Information    Follow up with [name], FNP In 2 weeks.    Contact information:    [address] [place] [address]  [phone]       Follow up with Gates Rigg, MD In 2 weeks. (Call the office for test results)    Contact information:    [address], [address]  [facility]  [place] [address]  [phone]       The results of significant diagnostics from this hospitalization (including imaging, microbiology, ancillary and laboratory) are listed below for reference.   Significant Diagnostic Studies:  Mr Autumn Davis BM Contrast  01/11/2013 *RADIOLOGY REPORT* Clinical Data: Pseudoseizures. MRI HEAD WITHOUT AND WITH CONTRAST Technique: Multiplanar, multiecho pulse sequences of the brain and surrounding structures were obtained according to standard protocol without and with intravenous contrast Contrast: 18mL MULTIHANCE GADOBENATE DIMEGLUMINE 529 MG/ML IV SOLN Comparison: CT head 08/26/2012 Findings: There is no evidence for acute infarction, intracranial hemorrhage, mass lesion, hydrocephalus, or extra-axial fluid. There is no atrophy or white matter disease. No foci of chronic hemorrhage. Normal midline structures. Post infusion, no abnormal intracranial enhancement. Negative osseous structures. Negative orbits and sinuses. IMPRESSION: Negative cranial MRI. Original Report Authenticated By: Davonna Belling, M.D.  Mr Cervical Spine W Wo Contrast  01/11/2013 *RADIOLOGY REPORT* Clinical Data: Subjective upper and lower extremity  weakness. Pseudoseizures. MRI CERVICAL SPINE WITHOUT AND WITH CONTRAST Technique: Multiplanar and multiecho pulse sequences of the cervical spine, to include the craniocervical junction and cervicothoracic junction, were obtained according to standard protocol without and with intravenous contrast. Contrast: 18mL MULTIHANCE GADOBENATE DIMEGLUMINE 529 MG/ML IV SOLN Comparison: None. Findings: There is no evidence for disc degeneration, disc herniation, vertebral body abnormality, or paraspinous mass. No abnormal enhancement postcontrast. Normal cord size and signal throughout. Axial images through the individual disc spaces reveal no disc protrusion or spinal stenosis. IMPRESSION: Negative cervical spine MRI. Original Report Authenticated By: Davonna Belling, M.D.  Mr Thoracic Spine W Wo Contrast  01/11/2013 *RADIOLOGY REPORT* Clinical Data: Subjective weakness in the upper and lower extremities.  Possible pseudoseizures. MRI THORACIC SPINE WITHOUT AND WITH CONTRAST Technique: Multiplanar and multiecho pulse sequences of the thoracic spine were obtained without and with intravenous contrast. Contrast: 18mL MULTIHANCE GADOBENATE DIMEGLUMINE 529 MG/ML IV SOLN Comparison: None. Findings: There is no evidence for disc degeneration, disc herniation, vertebral body abnormality, or paraspinous mass. No abnormal enhancement postcontrast. Normal cord size and signal throughout. Axial images through the individual disc spaces reveal no disc protrusion or spinal stenosis. IMPRESSION: Negative thoracic spine MRI without and with contrast. Original Report Authenticated By: Davonna Belling, M.D.  Microbiology:  No results found for this or any previous visit (from the past 240 hour(s)).  Labs:  Basic Metabolic Panel:   Recent Labs  Lab  01/10/13 2317   NA  140   K  2.9*   CL  110   CO2  17*   GLUCOSE  112*   BUN  9   CREATININE  0.84   CALCIUM  9.4   MG  2.2   PHOS  3.4    Liver Function Tests:   Recent Labs  Lab   01/10/13 2317   AST  16   ALT  12   ALKPHOS  54   BILITOT  0.3   PROT  6.7   ALBUMIN  3.8    No results found for this basename: LIPASE, AMYLASE, in the last 168 hours  No results found for this basename: AMMONIA, in the last 168 hours  CBC:   Recent Labs  Lab  01/10/13 2317   WBC  9.8   NEUTROABS  4.5   HGB  12.1   HCT  35.4*   MCV  87.8   PLT  355    Cardiac Enzymes:   Recent Labs  Lab  01/10/13 2317  01/11/13 1108   CKTOTAL  54  56    BNP:  BNP (last 3 results)  No results found for this basename: PROBNP, in the last 8760 hours  CBG:  No results found for this basename: GLUCAP, in the last 168 hours  Signed:  LAMA,GAGAN S  [facility]  01/12/2013, 1:13 PM

## 2013-01-12 NOTE — Progress Notes (Signed)
NEURO HOSPITALIST PROGRESS NOTE   SUBJECTIVE:                                                                                                                        Patient had a period of time in which she was improving last night, able to sit at edge of bed, walk with assistance, feed herself --per mother.  Today patient is again complaining of pain with movement.   OBJECTIVE:                                                                                                                           Vital signs in last 24 hours: Temp:  [97.5 F (36.4 C)-98.2 F (36.8 C)] 98.2 F (36.8 C) (02/26 0902) Pulse Rate:  [77-111] 102 (02/26 0902) Resp:  [16-20] 20 (02/26 0902) BP: (96-111)/(55-70) 111/64 mmHg (02/26 0902) SpO2:  [97 %-100 %] 97 % (02/26 0902)  Intake/Output from previous day:   Intake/Output this shift: Total I/O In: 50 [P.O.:50] Out: -  Nutritional status: General  Past Medical History  Diagnosis Date  . GERD (gastroesophageal reflux disease)   . Hyperlipidemia   . Seizures   . Thyroid disease   . Bronchitis, chronic/intermittent 01/22/2012  . Depression 01/22/2012  . IBS (irritable bowel syndrome) 01/22/2012  . Seizures     Neurologic ROS negative with exception of above. Musculoskeletal ROS weakness  Neurologic Exam:  Mental Status: Alert, oriented, thought content appropriate.  Speech fluent without evidence of aphasia.  Able to follow 3 step commands without difficulty. Cranial Nerves: II: Visual fields grossly normal, pupils equal, round, reactive to light and accommodation III,IV, VI: ptosis not present, extra-ocular motions intact bilaterally V,VII: smile symmetric, facial light touch sensation normal bilaterally VIII: hearing normal bilaterally IX,X: gag reflex present XI: bilateral shoulder shrug XII: midline tongue extension Motor: Right : Upper extremity   4/5    Left:     Upper extremity   4/5  Lower extremity    4/5     Lower extremity   4/5 --exam shows significant give way weakness and often states she cannot move her extremities due to pain. If I state I will help her and just place my hand on her arms  or legs she moves her extremities with full strength. I do not note any increased tone or spacticity on exam.  Tone and bulk:normal tone throughout; no atrophy noted Sensory: Pinprick and light touch intact throughout, bilaterally Deep Tendon Reflexes: 2+ and symmetric throughout and 1+ bilateral AJ. No hyperreflexia.  Plantars: Right: downgoing   Left: downgoing Cerebellar: normal finger-to-nose,   CV: pulses palpable throughout    Lab Results: No results found for this basename: cbc, bmp, coags, chol, tri, ldl, hga1c   Lipid Panel No results found for this basename: CHOL, TRIG, HDL, CHOLHDL, VLDL, LDLCALC,  in the last 72 hours  Studies/Results: Mr Laqueta Jean Wo Contrast  01/11/2013  *RADIOLOGY REPORT*  Clinical Data: Pseudoseizures.  MRI HEAD WITHOUT AND WITH CONTRAST  Technique:  Multiplanar, multiecho pulse sequences of the brain and surrounding structures were obtained according to standard protocol without and with intravenous contrast  Contrast: 18mL MULTIHANCE GADOBENATE DIMEGLUMINE 529 MG/ML IV SOLN  Comparison: CT head 08/26/2012  Findings: There is no evidence for acute infarction, intracranial hemorrhage, mass lesion, hydrocephalus, or extra-axial fluid. There is no atrophy or white matter disease.  No foci of chronic hemorrhage.  Normal midline structures.  Post infusion, no abnormal intracranial enhancement. Negative osseous structures.  Negative orbits and sinuses.  IMPRESSION: Negative cranial MRI.   Original Report Authenticated By: Davonna Belling, M.D.    Mr Cervical Spine W Wo Contrast  01/11/2013  *RADIOLOGY REPORT*  Clinical Data: Subjective upper and lower extremity weakness. Pseudoseizures.  MRI CERVICAL SPINE WITHOUT AND WITH CONTRAST  Technique:  Multiplanar and multiecho pulse  sequences of the cervical spine, to include the craniocervical junction and cervicothoracic junction, were obtained according to standard protocol without and with intravenous contrast.  Contrast: 18mL MULTIHANCE GADOBENATE DIMEGLUMINE 529 MG/ML IV SOLN  Comparison: None.  Findings: There is no evidence for disc degeneration, disc herniation, vertebral body abnormality, or paraspinous mass.  No abnormal enhancement postcontrast.  Normal cord size and signal throughout.  Axial images through the individual disc spaces reveal no disc protrusion or spinal stenosis.  IMPRESSION: Negative cervical spine MRI.   Original Report Authenticated By: Davonna Belling, M.D.    Mr Thoracic Spine W Wo Contrast  01/11/2013  *RADIOLOGY REPORT*  Clinical Data: Subjective weakness in the upper and lower extremities.  Possible pseudoseizures.  MRI THORACIC SPINE WITHOUT AND WITH CONTRAST  Technique:  Multiplanar and multiecho pulse sequences of the thoracic spine were obtained without and with intravenous contrast.  Contrast: 18mL MULTIHANCE GADOBENATE DIMEGLUMINE 529 MG/ML IV SOLN  Comparison: None.  Findings: There is no evidence for disc degeneration, disc herniation, vertebral body abnormality, or paraspinous mass.  No abnormal enhancement postcontrast.  Normal cord size and signal throughout.  Axial images through the individual disc spaces reveal no disc protrusion or spinal stenosis.  IMPRESSION: Negative thoracic spine MRI without and with contrast.   Original Report Authenticated By: Davonna Belling, M.D.     MEDICATIONS  Scheduled: . acetaZOLAMIDE  250 mg Oral TID  . enoxaparin (LOVENOX) injection  40 mg Subcutaneous Q24H  . fenofibrate  160 mg Oral Daily  . lamoTRIgine  200 mg Oral Daily  . topiramate  100 mg Oral BID  . traMADol  50 mg Oral Q12H    ASSESSMENT/PLAN:                                                                                                             27 yo F with increased muscle "tightness" and percieved weakness. Continue not have any definitive objective findings to confirm her complaints. Her normal MRI brain and spine essentially rules out multiple sclerosis or transverse myelitis. 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 With the increased muscle "tightness" two possible though very unlikely considerations would be a disease on the neuromyotonia spectrum(with many having VGKC antibodies) or gad associated stiffness. Mother continues to refuse phychiatric consult.   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 Pearlean Brownie. [facility] (primary neurologist)  Neurology will S/O   Assessment and plan discussed with with attending physician and they are in agreement.    Felicie Morn PA-C [facility] [phone]  01/12/2013, 9:59 AM

## 2013-01-12 NOTE — Progress Notes (Signed)
patient's repeat potassium today is 3.0. Will give K. Dur 40 mEq by mouth x1  And discharge her on by mouth K. Dur 20 mg by mouth daily for 2 weeks. Patient will follow with a primary care provider for repeat BMP

## 2013-01-13 LAB — GLUTAMIC ACID DECARBOXYLASE AUTO ABS: Glutamic Acid Decarb Ab: <1 U/mL (ref <=1.0)

## 2013-01-20 LAB — MISCELLANEOUS TEST

## 2013-01-28 ENCOUNTER — Other Ambulatory Visit: Payer: Self-pay | Admitting: Nurse Practitioner

## 2013-01-28 LAB — COMPLETE METABOLIC PANEL WITH GFR
AST: 16 U/L (ref 0–37)
Albumin: 4.6 g/dL (ref 3.5–5.2)
Alkaline Phosphatase: 59 U/L (ref 39–117)
GFR, Est Non African American: >89 mL/min
Glucose, Bld: 91 mg/dL (ref 70–99)
Potassium: 3.5 mEq/L (ref 3.5–5.3)
Sodium: 139 mEq/L (ref 135–145)
Total Protein: 6.7 g/dL (ref 6.0–8.3)

## 2013-01-31 NOTE — Discharge Summary (Signed)
Physician Discharge Summary   Autumn Davis WUJ:[id number] DOB: [birth year] DOA: 01/10/2013  PCP: [name], FNP  Admit date: 01/10/2013  Discharge date: 01/12/2013  Time spent: 50 minutes  Recommendations for Outpatient Follow-up:  Follow up PCP in 2 weeks  Followup Dr. Pearlean Brownie in 2 weeks  Call Dr. [name] office to discuss the lab results  Discharge Diagnoses:  Active Problems:  Hyperlipidemia  Quadriplegia   Discharge Condition: Stable  Diet recommendation: Regular diet  Filed Weights    01/10/13 2200   Weight:  80.4 kg (177 lb 4 oz)    History of present illness:  27 y.o. female with prior h/o migraines, pseudo seizures, hyperlipidemia. PCOS, depression, anxiety was sent in from Dr [name] office for generalized weakness over the last few weeks, associated with rigid extremities, urine incontinence, decreased sensation over her right extremity more than left extremity. She denies chest pain, fever,. She reports occasional headaches , more in the Posterior area of the head. associated with blurry vision lasting for 30 minutes. She Reports occasional double vision. No hearing deficits.no change in voice. She reports occasional tremors in her hands. Over the last few days, she reports her husband has been carrying her , as she is not able to ambulate. She reports chronic cough that is not responding to multiple courses of z pack. Her travel h[place]o includes travel [place] of [place]. She denies any sickness during her travel. She reports having flu prior to the travel to [place].  She also reports pain in the right rib cage since one week. She underwent a MRI of the brain last feb and was found to be insignificant. She never had an LP done before. She was sent in from Dr [name] office and is being admitted to hospitalist service for further evaluation. Neurology consult from Dr [name] was request and mRI of the brain and thoracic and cervical spine ordered.  Hospital  Course:  Muscle weakness  Patient has Long-standing history of pseudoseizures, anxiety, depression, hyperlipidemia, muscle weakness. She was admitted from Dr. [name] office for further evaluation. Patient was seen by neurology, MRI brain, cervical spine and thoracic spine were ordered which were all negative. 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 Patient will followup with her neurologist as outpatient.  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 Pearlean Brownie. [facility] (primary neurologist)  We will continue the patient on Topamax, Lamictal , Diamox.  We'll give her Tramadol twice a day for pain  Hypokalemia  will check the potassium and replace it before discharge  Procedures:him him him him  None Consultations:  Neurology Discharge Exam:  Filed Vitals:    01/11/13 2152  01/12/13 0602  01/12/13 0700  01/12/13 0902   BP:  111/61  96/55   111/64   Pulse:  86  111   102   Temp:  97.5 F (36.4 C)  97.9 F (36.6 C)   98.2 F (36.8 C)   TempSrc:  Oral    Oral   Resp:  16  18   20    Height:       Weight:       SpO2:  100%  98%  97%  97%    General: Appearing no acute distress  Cardiovascular: S1-S2 regular  Respiratory: clear to auscultation bilaterally  extremities: No edema, continued muscle weakness  Discharge Instructions  Discharge Orders    Future Orders  Complete By  Expires     Diet general  As directed      Increase activity slowly  As directed          Medication List     TAKE these medications       acetaZOLAMIDE 250 MG tablet    Commonly known as: DIAMOX    Take 250 mg by mouth 3 (three) times daily.    fenofibrate micronized 134 MG capsule    Commonly known as: LOFIBRA    Take 134 mg by mouth at bedtime.    lamoTRIgine 200 MG tablet    Commonly known as: LAMICTAL    Take 200 mg by mouth daily.    montelukast  10 MG tablet    Commonly known as: SINGULAIR    Take 10 mg by mouth at bedtime.    rosuvastatin 10 MG tablet    Commonly known as: CRESTOR    Take 10 mg by mouth at bedtime.    topiramate 100 MG tablet    Commonly known as: TOPAMAX    Take 100 mg by mouth 2 (two) times daily.    traMADol 50 MG tablet    Commonly known as: ULTRAM    Take 1 tablet (50 mg total) by mouth every 12 (twelve) hours.          Follow-up Information    Follow up with [name], FNP In 2 weeks.    Contact information:    [address] [place] [address]  [phone]       Follow up with Gates Rigg, MD In 2 weeks. (Call the office for test results)    Contact information:    [address], [address]  [facility]  [place] [address]  [phone]       The results of significant diagnostics from this hospitalization (including imaging, microbiology, ancillary and laboratory) are listed below for reference.   Significant Diagnostic Studies:  Mr [name] Contrast  01/11/2013 *RADIOLOGY REPORT* Clinical Data: Pseudoseizures. MRI HEAD WITHOUT AND WITH CONTRAST Technique: Multiplanar, multiecho pulse sequences of the brain and surrounding structures were obtained according to standard protocol without and with intravenous contrast Contrast: 18mL MULTIHANCE GADOBENATE DIMEGLUMINE 529 MG/ML IV SOLN Comparison: CT head 08/26/2012 Findings: There is no evidence for acute infarction, intracranial hemorrhage, mass lesion, hydrocephalus, or extra-axial fluid. There is no atrophy or white matter disease. No foci of chronic hemorrhage. Normal midline structures. Post infusion, no abnormal intracranial enhancement. Negative osseous structures. Negative orbits and sinuses. IMPRESSION: Negative cranial MRI. Original Report Authenticated By: Davonna Belling, M.D.  Mr Cervical Spine W Wo Contrast  01/11/2013 *RADIOLOGY REPORT* Clinical Data: Subjective upper and lower extremity  weakness. Pseudoseizures. MRI CERVICAL SPINE WITHOUT AND WITH CONTRAST Technique: Multiplanar and multiecho pulse sequences of the cervical spine, to include the craniocervical junction and cervicothoracic junction, were obtained according to standard protocol without and with intravenous contrast. Contrast: 18mL MULTIHANCE GADOBENATE DIMEGLUMINE 529 MG/ML IV SOLN Comparison: None. Findings: There is no evidence for disc degeneration, disc herniation, vertebral body abnormality, or paraspinous mass. No abnormal enhancement postcontrast. Normal cord size and signal throughout. Axial images through the individual disc spaces reveal no disc protrusion or spinal stenosis. IMPRESSION: Negative cervical spine MRI. Original Report Authenticated By: Davonna Belling, M.D.  Mr Thoracic Spine W Wo Contrast  01/11/2013 *RADIOLOGY REPORT* Clinical Data: Subjective weakness in the upper and lower extremities.  Possible pseudoseizures. MRI THORACIC SPINE WITHOUT AND WITH CONTRAST Technique: Multiplanar and multiecho pulse sequences of the thoracic spine were obtained without and with intravenous contrast. Contrast: 18mL MULTIHANCE GADOBENATE DIMEGLUMINE 529 MG/ML IV SOLN Comparison: None. Findings: There is no evidence for disc degeneration, disc herniation, vertebral body abnormality, or paraspinous mass. No abnormal enhancement postcontrast. Normal cord size and signal throughout. Axial images through the individual disc spaces reveal no disc protrusion or spinal stenosis. IMPRESSION: Negative thoracic spine MRI without and with contrast. Original Report Authenticated By: Davonna Belling, M.D.  Microbiology:  No results found for this or any previous visit (from the past 240 hour(s)).  Labs:  Basic Metabolic Panel:   Recent Labs  Lab  01/10/13 2317   NA  140   K  2.9*   CL  110   CO2  17*   GLUCOSE  112*   BUN  9   CREATININE  0.84   CALCIUM  9.4   MG  2.2   PHOS  3.4    Liver Function Tests:   Recent Labs  Lab   01/10/13 2317   AST  16   ALT  12   ALKPHOS  54   BILITOT  0.3   PROT  6.7   ALBUMIN  3.8    No results found for this basename: LIPASE, AMYLASE, in the last 168 hours  No results found for this basename: AMMONIA, in the last 168 hours  CBC:   Recent Labs  Lab  01/10/13 2317   WBC  9.8   NEUTROABS  4.5   HGB  12.1   HCT  35.4*   MCV  87.8   PLT  355    Cardiac Enzymes:   Recent Labs  Lab  01/10/13 2317  01/11/13 1108   CKTOTAL  54  56    BNP:  BNP (last 3 results)  No results found for this basename: PROBNP, in the last 8760 hours  CBG:  No results found for this basename: GLUCAP, in the last 168 hours  Signed:  LAMA,GAGAN S  [facility]  01/12/2013, 1:13 PM

## 2013-02-01 ENCOUNTER — Telehealth: Payer: Self-pay | Admitting: Family Medicine

## 2013-02-01 LAB — NMR LIPOPROFILE WITH LIPIDS
Cholesterol, Total: 247 mg/dL — ABNORMAL HIGH (ref <200)
Large HDL-P: <1.3 umol/L — ABNORMAL LOW (ref >=4.8)
Large VLDL-P: 1.2 nmol/L (ref <=2.7)
Triglycerides: 195 mg/dL — ABNORMAL HIGH (ref <150)

## 2013-02-01 NOTE — Telephone Encounter (Signed)
Patient stopped all meds due to all over weakness. Tell patient strict low fat diet and we will recheck later. I do not want her to take anything at this time.

## 2013-02-01 NOTE — Telephone Encounter (Signed)
Message copied by Azalee Course on Tue Feb 01, 2013  6:19 PM ------      Message from: [name]      Created: Tue Feb 01, 2013  3:48 PM       LDL particle # elevated      LDL elevated      Trig elevated      Strict diet X 3 months if no better will need to increase Crestor dose ------

## 2013-02-02 ENCOUNTER — Telehealth: Payer: Self-pay | Admitting: Family Medicine

## 2013-02-02 NOTE — Telephone Encounter (Signed)
Pt.notified

## 2013-02-02 NOTE — Telephone Encounter (Signed)
Patient aware of labs.  

## 2013-02-03 ENCOUNTER — Other Ambulatory Visit: Payer: Self-pay | Admitting: Nurse Practitioner

## 2013-02-03 ENCOUNTER — Telehealth: Payer: Self-pay | Admitting: Nurse Practitioner

## 2013-02-03 DIAGNOSIS — M6281 Muscle weakness (generalized): Secondary | ICD-10-CM

## 2013-02-03 NOTE — Telephone Encounter (Signed)
Referral [facility]

## 2013-02-03 NOTE — Telephone Encounter (Signed)
I have no referral for [facility]  I don't know what this means?

## 2013-02-03 NOTE — Telephone Encounter (Signed)
Please advise 

## 2013-03-15 ENCOUNTER — Telehealth: Payer: Self-pay | Admitting: Nurse Practitioner

## 2013-03-16 ENCOUNTER — Ambulatory Visit: Payer: 59 | Admitting: Family Medicine

## 2013-03-16 DIAGNOSIS — Z5189 Encounter for other specified aftercare: Secondary | ICD-10-CM

## 2013-03-16 DIAGNOSIS — R569 Unspecified convulsions: Secondary | ICD-10-CM

## 2013-03-16 DIAGNOSIS — M6281 Muscle weakness (generalized): Secondary | ICD-10-CM

## 2013-03-16 DIAGNOSIS — R269 Unspecified abnormalities of gait and mobility: Secondary | ICD-10-CM

## 2013-03-16 DIAGNOSIS — F449 Dissociative and conversion disorder, unspecified: Secondary | ICD-10-CM

## 2013-03-16 NOTE — Telephone Encounter (Signed)
Pt aware rx was sent in for her mom

## 2013-03-16 NOTE — Telephone Encounter (Signed)
RX called in for [facility] for 10 patches. Should ony need 3 each. SO shouldn't need more than 10 patches

## 2013-03-16 NOTE — Telephone Encounter (Signed)
Please advise the drug store in [place]

## 2013-05-25 ENCOUNTER — Ambulatory Visit (INDEPENDENT_AMBULATORY_CARE_PROVIDER_SITE_OTHER): Payer: PRIVATE HEALTH INSURANCE | Admitting: Nurse Practitioner

## 2013-05-25 ENCOUNTER — Encounter: Payer: Self-pay | Admitting: Nurse Practitioner

## 2013-05-25 VITALS — BP 113/80 | HR 70 | Temp 97.1°F | Ht 68.0 in | Wt 180.0 lb

## 2013-05-25 DIAGNOSIS — M791 Myalgia, unspecified site: Secondary | ICD-10-CM

## 2013-05-25 DIAGNOSIS — N949 Unspecified condition associated with female genital organs and menstrual cycle: Secondary | ICD-10-CM

## 2013-05-25 DIAGNOSIS — E785 Hyperlipidemia, unspecified: Secondary | ICD-10-CM

## 2013-05-25 DIAGNOSIS — E282 Polycystic ovarian syndrome: Secondary | ICD-10-CM

## 2013-05-25 DIAGNOSIS — R102 Pelvic and perineal pain: Secondary | ICD-10-CM

## 2013-05-25 DIAGNOSIS — IMO0001 Reserved for inherently not codable concepts without codable children: Secondary | ICD-10-CM

## 2013-05-25 LAB — POCT CBC
Granulocyte percent: 51.13 %G (ref 37–80)
HCT, POC: 39.6 % (ref 37.7–47.9)
Hemoglobin: 14 g/dL (ref 12.2–16.2)
MCV: 85.6 fL (ref 80–97)
RDW, POC: 13.3 %
WBC: 8.9 10*3/uL (ref 4.6–10.2)

## 2013-05-25 LAB — COMPLETE METABOLIC PANEL WITH GFR
AST: 11 U/L (ref 0–37)
Albumin: 4.2 g/dL (ref 3.5–5.2)
Alkaline Phosphatase: 97 U/L (ref 39–117)
Potassium: 3.7 mEq/L (ref 3.5–5.3)
Sodium: 140 mEq/L (ref 135–145)
Total Protein: 6.4 g/dL (ref 6.0–8.3)

## 2013-05-25 LAB — THYROID PANEL WITH TSH
Free Thyroxine Index: 3.7 (ref 1.0–3.9)
T3 Uptake: 35.7 % (ref 22.5–37.0)
T4, Total: 10.4 ug/dL (ref 5.0–12.5)

## 2013-05-25 MED ORDER — HYDROCODONE-ACETAMINOPHEN 5-325 MG PO TABS: 1.0000 | ORAL_TABLET | Freq: Four times a day (QID) | ORAL | Status: DC | PRN

## 2013-05-25 MED ORDER — KETOROLAC TROMETHAMINE 60 MG/2ML IM SOLN
60.0000 mg | Freq: Once | INTRAMUSCULAR | Status: AC
  Administered 2013-05-25: 60 mg via INTRAMUSCULAR

## 2013-05-25 NOTE — Patient Instructions (Signed)
Pelvic Pain Pelvic pain is pain below the belly button and located between your hips. Acute pain may last a few hours or days. Chronic pelvic pain may last weeks and months. The cause may be different for different types of pain. The pain may be dull or sharp, mild or severe and can interfere with your daily activities. Write down and tell your caregiver:   Exactly where the pain is located.  If it comes and goes or is there all the time.  When it happens (with sex, urination, bowel movement, etc.)  If the pain is related to your menstrual period or stress. Your caregiver will take a full history and do a complete physical exam and Pap test. CAUSES   Painful menstrual periods (dysmenorrhea).  Normal ovulation (Mittelschmertz) that occurs in the middle of the menstrual cycle every month.  The pelvic organs get engorged with blood just before the menstrual period (pelvic congestive syndrome).  Scar tissue from an infection or past surgery (pelvic adhesions).  Cancer of the female pelvic organs. When there is pain with cancer, it has been there for a long time.  The lining of the uterus (endometrium) abnormally grows in places like the pelvis and on the pelvic organs (endometriosis).  A form of endometriosis with the lining of the uterus present inside of the muscle tissue of the uterus (adenomyosis).  Fibroid tumor (noncancerous) in the uterus.  Bladder problems such as infection, bladder spasms of the muscle tissue of the bladder.  Intestinal problems (irritable bowel syndrome, colitis, an ulcer or gastrointestinal infection).  Polyps of the cervix or uterus.  Pregnancy in the tube (ectopic pregnancy).  The opening of the cervix is too small for the menstrual blood to flow through it (cervical stenosis).  Physical or sexual abuse (past or present).  Musculo-skeletal problems from poor posture, problems with the vertebrae of the lower back or the uterine pelvic muscles falling  (prolapse).  Psychological problems such as depression or stress.  IUD (intrauterine device) in the uterus. DIAGNOSIS  Tests to make a diagnosis depends on the type, location, severity and what causes the pain to occur. Tests that may be needed include:  Blood tests.  Urine tests  Ultrasound.  X-rays.  CT Scan.  MRI.  Laparoscopy.  Major surgery. TREATMENT  Treatment will depend on the cause of the pain, which includes:  Prescription or over-the-counter pain medication.  Antibiotics.  Birth control pills.  Hormone treatment.  Nerve blocking injections.  Physical therapy.  Antidepressants.  Counseling with a psychiatrist or psychologist.  Minor or major surgery. HOME CARE INSTRUCTIONS   Only take over-the-counter or prescription medicines for pain, discomfort or fever as directed by your caregiver.  Follow your caregiver's advice to treat your pain.  Rest.  Avoid sexual intercourse if it causes the pain.  Apply warm or cold compresses (which ever works best) to the pain area.  Do relaxation exercises such as yoga or meditation.  Try acupuncture.  Avoid stressful situations.  Try group therapy.  If the pain is because of a stomach/intestinal upset, drink clear liquids, eat a bland light food diet until the symptoms go away. SEEK MEDICAL CARE IF:   You need stronger prescription pain medication.  You develop pain with sexual intercourse.  You have pain with urination.  You develop a temperature of 102 F (38.9 C) with the pain.  You are still in pain after 4 hours of taking prescription medication for the pain.  You need depression medication.    Your IUD is causing pain and you want it removed. SEEK IMMEDIATE MEDICAL CARE IF:  You develop very severe pain or tenderness.  You faint, have chills, severe weakness or dehydration.  You develop heavy vaginal bleeding or passing solid tissue.  You develop a temperature of 102 F (38.9 C)  with the pain.  You have blood in the urine.  You are being physically or sexually abused.  You have uncontrolled vomiting and diarrhea.  You are depressed and afraid of harming yourself or someone else. Document Released: 12/11/2004 Document Revised: 01/26/2012 Document Reviewed: 09/07/2008 Exit[employer] Patient Information 2013 [employer].  

## 2013-05-25 NOTE — Progress Notes (Signed)
Subjective:    Patient ID: Autumn Davis, female    DOB: 09/12/1986, 27 y.o.   MRN: [id number]  HPI Patient in today for follow-up- She is seeing lots of specialists for muscle problems- she all the sudden became quadriplegic and unable to move about 2 years ago- They are still  Not 100% sure what is going on and is seeing specialist at [facility]. Has an appointment with a new specialist to have a new test done on her muscles in a few weeks- She has improved some- ABle to walk short distances now but still has to use her wheel chair a lot,and is still having seizures a few times a week. Current problems: Hyperlipidemia- Currently not taking anything because it seems to make her muscles worse Pain right groin area- started Saturday night and is getting worse- bothers her more at night- denies fever, no nausea or vomiting. Husband says she has pain in this area off and on and it seems to be the same but is just worse this time.    Review of Systems  Constitutional: Positive for fatigue. Negative for fever, activity change and appetite change.  HENT: Negative.   Eyes: Negative.   Respiratory: Negative.   Gastrointestinal: Positive for abdominal pain. Negative for nausea, vomiting, diarrhea, constipation and blood in stool.  Genitourinary: Negative.   Musculoskeletal: Negative.   Skin: Negative.   Allergic/Immunologic: Negative.   Hematological: Negative.   Psychiatric/Behavioral: Negative.        Objective:   Physical Exam  Constitutional: She is oriented to person, place, and time. She appears well-developed and well-nourished. She appears distressed (mild- holding right groin area.).  Cardiovascular: Normal rate and normal heart sounds.   Pulmonary/Chest: Effort normal and breath sounds normal.  Abdominal: Soft. She exhibits no distension and no mass. There is tenderness (right pelvic ). There is no rebound and no guarding.  Musculoskeletal: Normal range of motion.  Neurological:  She is alert and oriented to person, place, and time. She has normal reflexes. No cranial nerve deficit.  Skin: Skin is warm and dry.  Psychiatric: She has a normal mood and affect. Her behavior is normal. Judgment and thought content normal.   BP 113/80  Pulse 70  Temp(Src) 97.1 F (36.2 C) (Oral)  Ht 5\' 8"  (1.727 m)  Wt 180 lb (81.647 kg)  BMI 27.38 kg/m2   Results for orders placed in visit on 05/25/13  POCT CBC      Result Value Range   WBC 8.9  4.6 - 10.2 K/uL   Lymph, poc 4.0 (*) 0.6 - 3.4   POC LYMPH PERCENT 44.9  10 - 50 %L   POC Granulocyte 4.5  2 - 6.9   Granulocyte percent 51.13  37 - 80 %G   RBC 4.6  4.04 - 5.48 M/uL   Hemoglobin 14.0  12.2 - 16.2 g/dL   HCT, POC 40.9  81.1 - 47.9 %   MCV 85.6  80 - 97 fL   MCH, POC 30.2  27 - 31.2 pg   MCHC 35.3  31.8 - 35.4 g/dL   RDW, POC 91.4     Platelet Count, POC 311.0  142 - 424 K/uL   MPV 9.3  0 - 99.8 fL        Assessment & Plan:  1. Hyperlipidemia Low fat diet - COMPLETE METABOLIC PANEL WITH GFR - NMR Lipoprofile with Lipids  2. PCOS (polycystic ovarian syndrome)  - Thyroid Panel With TSH - Insulin-free, total  and bound), blood  3. Muscle pain Keep follow up with specialist - Arthritis Panel  4. Pelvic pain in female Moist heat - POCT CBC - ketorolac (TORADOL) injection 60 mg; Inject 2 mLs (60 mg total) into the muscle once. - HYDROcodone-acetaminophen (LORTAB) 5-325 MG per tablet; Take 1 tablet by mouth every 6 (six) hours as needed for pain.  Dispense: 40 tablet; Refill: 0  Will talk when get labs back Handicap sticker given to patient  Mary-Margaret Daphine Deutscher, FNP

## 2013-05-26 LAB — NMR LIPOPROFILE WITH LIPIDS
Cholesterol, Total: 226 mg/dL — ABNORMAL HIGH (ref <200)
Large HDL-P: 6.3 umol/L (ref >=4.8)
Large VLDL-P: 1.6 nmol/L (ref <=2.7)
Triglycerides: 134 mg/dL (ref <150)
VLDL Size: 41.6 nm (ref <=46.6)

## 2013-05-26 LAB — ANTI-NUCLEAR AB-TITER (ANA TITER): ANA Titer 1: 1:80 {titer} — ABNORMAL HIGH

## 2013-05-26 LAB — ARTHRITIS PANEL
Anti Nuclear Antibody(ANA): POSITIVE — AB
Rhuematoid fact SerPl-aCnc: <10 IU/mL (ref <=14)
Uric Acid, Serum: 3.9 mg/dL (ref 2.4–6.0)

## 2013-05-30 NOTE — Addendum Note (Signed)
Addended by: Tommas Olp on: 05/30/2013 08:56 AM   Modules accepted: Orders

## 2013-06-06 ENCOUNTER — Telehealth: Payer: Self-pay | Admitting: Nurse Practitioner

## 2013-06-06 NOTE — Telephone Encounter (Signed)
Are we working on her referral to rheumatology?

## 2013-06-08 ENCOUNTER — Other Ambulatory Visit: Payer: Self-pay | Admitting: Nurse Practitioner

## 2013-06-08 DIAGNOSIS — R102 Pelvic and perineal pain: Secondary | ICD-10-CM

## 2013-06-08 NOTE — Telephone Encounter (Signed)
Says in computer printed 05/25/13  Patient calling in today for printed RX   Nothing in lock box  MMM

## 2013-06-09 MED ORDER — HYDROCODONE-ACETAMINOPHEN 5-325 MG PO TABS: 1.0000 | ORAL_TABLET | Freq: Four times a day (QID) | ORAL | Status: DC | PRN

## 2013-06-09 NOTE — Telephone Encounter (Signed)
rx ready for pick up- (can't find original rx)

## 2013-06-09 NOTE — Telephone Encounter (Signed)
We don't have a referral in workqueue for Rheumatology for [facility]

## 2013-06-10 NOTE — Telephone Encounter (Signed)
Per Joyce Gross Rx up front and pt aware

## 2013-06-24 ENCOUNTER — Telehealth: Payer: Self-pay | Admitting: Nurse Practitioner

## 2013-06-30 ENCOUNTER — Other Ambulatory Visit: Payer: Self-pay

## 2013-06-30 MED ORDER — MONTELUKAST SODIUM 10 MG PO TABS: 10.0000 mg | ORAL_TABLET | Freq: Every day | ORAL | Status: DC

## 2013-07-05 ENCOUNTER — Telehealth: Payer: Self-pay | Admitting: Nurse Practitioner

## 2013-07-05 DIAGNOSIS — R532 Functional quadriplegia: Secondary | ICD-10-CM

## 2013-07-05 DIAGNOSIS — M2550 Pain in unspecified joint: Secondary | ICD-10-CM

## 2013-07-05 NOTE — Telephone Encounter (Signed)
Repeated rheumatology referral and pt referral

## 2013-07-05 NOTE — Telephone Encounter (Signed)
Please advise 

## 2013-08-05 ENCOUNTER — Ambulatory Visit (INDEPENDENT_AMBULATORY_CARE_PROVIDER_SITE_OTHER): Payer: PRIVATE HEALTH INSURANCE | Admitting: Nurse Practitioner

## 2013-08-05 ENCOUNTER — Encounter: Payer: Self-pay | Admitting: Nurse Practitioner

## 2013-08-05 ENCOUNTER — Telehealth: Payer: Self-pay | Admitting: Nurse Practitioner

## 2013-08-05 VITALS — BP 112/79 | HR 75 | Temp 98.7°F | Ht 68.0 in | Wt 178.0 lb

## 2013-08-05 DIAGNOSIS — IMO0001 Reserved for inherently not codable concepts without codable children: Secondary | ICD-10-CM

## 2013-08-05 DIAGNOSIS — M791 Myalgia, unspecified site: Secondary | ICD-10-CM

## 2013-08-05 DIAGNOSIS — M2550 Pain in unspecified joint: Secondary | ICD-10-CM

## 2013-08-05 MED ORDER — KETOROLAC TROMETHAMINE 60 MG/2ML IM SOLN
60.0000 mg | Freq: Once | INTRAMUSCULAR | Status: AC
  Administered 2013-08-05: 60 mg via INTRAMUSCULAR

## 2013-08-05 NOTE — Patient Instructions (Addendum)

## 2013-08-05 NOTE — Telephone Encounter (Signed)
Pt having continued pain Wants Toradol inj appt scheduled

## 2013-08-05 NOTE — Progress Notes (Signed)
Subjective:    Patient ID: Autumn Davis, female    DOB: 10/03/1986, 27 y.o.   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 Pt here for chronic pain in her neck, bilateral ribs, legs, and arms for the last two and half years. Pt states she is undiagnosed, but has been told she has questionable MS.  Pt has RX for lortab, but states that does not help for pain and has not been using it.  Pt takes Skelaxin 800 mg with no relief.    Pt also has seizures. Pt takes Diamox and Lamictal. Pt states before taking these meds. She had multiple seizures a day, but now they come in "clusters". She can go a few days without one. Pt has appointment with her neurologist in Oct. 27.    Review of Systems  Constitutional: Negative for fever.  Musculoskeletal: Positive for myalgias, back pain, joint swelling, arthralgias and gait problem.  Neurological: Positive for seizures and weakness.  All other systems reviewed and are negative.       Objective:   Physical Exam  Vitals reviewed. Constitutional: She is oriented to person, place, and time. She appears well-developed and well-nourished.  Cardiovascular: Normal rate, regular rhythm, normal heart sounds and intact distal pulses.   Pulmonary/Chest: Effort normal and breath sounds normal.  Musculoskeletal: She exhibits tenderness.  Decreased ROM of legs, back, and torso due to pain   Neurological: She is alert and oriented to person, place, and time.  Skin: Skin is warm and dry.  Psychiatric: She has a normal mood and affect. Her behavior is normal. Judgment and thought content normal.    BP 112/79  Pulse 75  Temp(Src) 98.7 F (37.1 C) (Oral)  Ht 5\' 8"  (1.727 m)  Wt 178 lb (80.74 kg)  BMI 27.07 kg/m2       Assessment & Plan:   1. Joint pain   2. Muscle pain     Meds ordered this encounter  Medications  . ketorolac (TORADOL) injection 60 mg    Sig:    Referral made back for PT at [facility] Keep appointment with neurologist referral to pain management  Mary-Margaret Daphine Deutscher, FNP

## 2013-08-29 ENCOUNTER — Other Ambulatory Visit (INDEPENDENT_AMBULATORY_CARE_PROVIDER_SITE_OTHER): Payer: PRIVATE HEALTH INSURANCE | Admitting: *Deleted

## 2013-08-29 ENCOUNTER — Ambulatory Visit: Payer: 59

## 2013-08-29 DIAGNOSIS — Z23 Encounter for immunization: Secondary | ICD-10-CM

## 2013-09-27 ENCOUNTER — Encounter: Payer: Self-pay | Admitting: Physical Medicine & Rehabilitation

## 2013-10-03 ENCOUNTER — Encounter: Payer: Self-pay | Admitting: Nurse Practitioner

## 2013-10-04 ENCOUNTER — Telehealth: Payer: Self-pay | Admitting: Nurse Practitioner

## 2013-10-04 NOTE — Telephone Encounter (Signed)
Pt called to schedule appt for med refills appt scheduled

## 2013-10-05 ENCOUNTER — Encounter: Payer: Self-pay | Admitting: Nurse Practitioner

## 2013-10-05 ENCOUNTER — Ambulatory Visit (INDEPENDENT_AMBULATORY_CARE_PROVIDER_SITE_OTHER): Payer: PRIVATE HEALTH INSURANCE | Admitting: Nurse Practitioner

## 2013-10-05 VITALS — BP 123/79 | HR 82 | Temp 99.5°F | Wt 181.0 lb

## 2013-10-05 DIAGNOSIS — K589 Irritable bowel syndrome without diarrhea: Secondary | ICD-10-CM

## 2013-10-05 DIAGNOSIS — K219 Gastro-esophageal reflux disease without esophagitis: Secondary | ICD-10-CM

## 2013-10-05 DIAGNOSIS — E785 Hyperlipidemia, unspecified: Secondary | ICD-10-CM

## 2013-10-05 DIAGNOSIS — G8220 Paraplegia, unspecified: Secondary | ICD-10-CM

## 2013-10-05 DIAGNOSIS — F3289 Other specified depressive episodes: Secondary | ICD-10-CM

## 2013-10-05 DIAGNOSIS — F32A Depression, unspecified: Secondary | ICD-10-CM

## 2013-10-05 DIAGNOSIS — N949 Unspecified condition associated with female genital organs and menstrual cycle: Secondary | ICD-10-CM

## 2013-10-05 DIAGNOSIS — F329 Major depressive disorder, single episode, unspecified: Secondary | ICD-10-CM

## 2013-10-05 DIAGNOSIS — R102 Pelvic and perineal pain: Secondary | ICD-10-CM

## 2013-10-05 MED ORDER — HYDROCODONE-ACETAMINOPHEN 5-325 MG PO TABS: 1.0000 | ORAL_TABLET | Freq: Four times a day (QID) | ORAL | Status: DC | PRN

## 2013-10-05 MED ORDER — KETOROLAC TROMETHAMINE 60 MG/2ML IM SOLN
60.0000 mg | Freq: Once | INTRAMUSCULAR | Status: AC
  Administered 2013-10-06: 60 mg via INTRAMUSCULAR

## 2013-10-05 MED ORDER — IPRATROPIUM-ALBUTEROL 0.5-2.5 (3) MG/3ML IN SOLN: 3.0000 mL | RESPIRATORY_TRACT | Status: DC | PRN

## 2013-10-05 NOTE — Patient Instructions (Signed)
Hypertriglyceridemia  Diet for High blood levels of Triglycerides Most fats in food are triglycerides. Triglycerides in your blood are stored as fat in your body. High levels of triglycerides in your blood may put you at a greater risk for heart disease and stroke.  Normal triglyceride levels are less than 150 mg/dL. Borderline high levels are 150-199 mg/dl. High levels are 200 - 499 mg/dL, and very high triglyceride levels are greater than 500 mg/dL. The decision to treat high triglycerides is generally based on the level. For people with borderline or high triglyceride levels, treatment includes weight loss and exercise. Drugs are recommended for people with very high triglyceride levels. Many people who need treatment for high triglyceride levels have metabolic syndrome. This syndrome is a collection of disorders that often include: insulin resistance, high blood pressure, blood clotting problems, high cholesterol and triglycerides. TESTING PROCEDURE FOR TRIGLYCERIDES  You should not eat 4 hours before getting your triglycerides measured. The normal range of triglycerides is between 10 and 250 milligrams per deciliter (mg/dl). Some people may have extreme levels (1000 or above), but your triglyceride level may be too high if it is above 150 mg/dl, depending on what other risk factors you have for heart disease.  People with high blood triglycerides may also have high blood cholesterol levels. If you have high blood cholesterol as well as high blood triglycerides, your risk for heart disease is probably greater than if you only had high triglycerides. High blood cholesterol is one of the main risk factors for heart disease. CHANGING YOUR DIET  Your weight can affect your blood triglyceride level. If you are more than 20% above your ideal body weight, you may be able to lower your blood triglycerides by losing weight. Eating less and exercising regularly is the best way to combat this. Fat provides more  calories than any other food. The best way to lose weight is to eat less fat. Only 30% of your total calories should come from fat. Less than 7% of your diet should come from saturated fat. A diet low in fat and saturated fat is the same as a diet to decrease blood cholesterol. By eating a diet lower in fat, you may lose weight, lower your blood cholesterol, and lower your blood triglyceride level.  Eating a diet low in fat, especially saturated fat, may also help you lower your blood triglyceride level. Ask your dietitian to help you figure how much fat you can eat based on the number of calories your caregiver has prescribed for you.  Exercise, in addition to helping with weight loss may also help lower triglyceride levels.   Alcohol can increase blood triglycerides. You may need to stop drinking alcoholic beverages.  Too much carbohydrate in your diet may also increase your blood triglycerides. Some complex carbohydrates are necessary in your diet. These may include bread, rice, potatoes, other starchy vegetables and cereals.  Reduce "simple" carbohydrates. These may include pure sugars, candy, honey, and jelly without losing other nutrients. If you have the kind of high blood triglycerides that is affected by the amount of carbohydrates in your diet, you will need to eat less sugar and less high-sugar foods. Your caregiver can help you with this.  Adding 2-4 grams of fish oil (EPA+ DHA) may also help lower triglycerides. Speak with your caregiver before adding any supplements to your regimen. Following the Diet  Maintain your ideal weight. Your caregivers can help you with a diet. Generally, eating less food and getting more   exercise will help you lose weight. Joining a weight control group may also help. Ask your caregivers for a good weight control group in your area.  Eat low-fat foods instead of high-fat foods. This can help you lose weight too.  These foods are lower in fat. Eat MORE of these:    Dried beans, peas, and lentils.  Egg whites.  Low-fat cottage cheese.  Fish.  Lean cuts of meat, such as round, sirloin, rump, and flank (cut extra fat off meat you fix).  Whole grain breads, cereals and pasta.  Skim and nonfat dry milk.  Low-fat yogurt.  Poultry without the skin.  Cheese made with skim or part-skim milk, such as mozzarella, parmesan, farmers', ricotta, or pot cheese. These are higher fat foods. Eat LESS of these:   Whole milk and foods made from whole milk, such as American, blue, cheddar, monterey jack, and swiss cheese  High-fat meats, such as luncheon meats, sausages, knockwurst, bratwurst, hot dogs, ribs, corned beef, ground pork, and regular ground beef.  Fried foods. Limit saturated fats in your diet. Substituting unsaturated fat for saturated fat may decrease your blood triglyceride level. You will need to read package labels to know which products contain saturated fats.  These foods are high in saturated fat. Eat LESS of these:   Fried pork skins.  Whole milk.  Skin and fat from poultry.  Palm oil.  Butter.  Shortening.  Cream cheese.  Bacon.  Margarines and baked goods made from listed oils.  Vegetable shortenings.  Chitterlings.  Fat from meats.  Coconut oil.  Palm kernel oil.  Lard.  Cream.  Sour cream.  Fatback.  Coffee whiteners and non-dairy creamers made with these oils.  Cheese made from whole milk. Use unsaturated fats (both polyunsaturated and monounsaturated) moderately. Remember, even though unsaturated fats are better than saturated fats; you still want a diet low in total fat.  These foods are high in unsaturated fat:   Canola oil.  Sunflower oil.  Mayonnaise.  Almonds.  Peanuts.  Pine nuts.  Margarines made with these oils.  Safflower oil.  Olive oil.  Avocados.  Cashews.  Peanut butter.  Sunflower seeds.  Soybean oil.  Peanut  oil.  Olives.  Pecans.  Walnuts.  Pumpkin seeds. Avoid sugar and other high-sugar foods. This will decrease carbohydrates without decreasing other nutrients. Sugar in your food goes rapidly to your blood. When there is excess sugar in your blood, your liver may use it to make more triglycerides. Sugar also contains calories without other important nutrients.  Eat LESS of these:   Sugar, brown sugar, powdered sugar, jam, jelly, preserves, honey, syrup, molasses, pies, candy, cakes, cookies, frosting, pastries, colas, soft drinks, punches, fruit drinks, and regular gelatin.  Avoid alcohol. Alcohol, even more than sugar, may increase blood triglycerides. In addition, alcohol is high in calories and low in nutrients. Ask for sparkling water, or a diet soft drink instead of an alcoholic beverage. Suggestions for planning and preparing meals   Bake, broil, grill or roast meats instead of frying.  Remove fat from meats and skin from poultry before cooking.  Add spices, herbs, lemon juice or vinegar to vegetables instead of salt, rich sauces or gravies.  Use a non-stick skillet without fat or use no-stick sprays.  Cool and refrigerate stews and broth. Then remove the hardened fat floating on the surface before serving.  Refrigerate meat drippings and skim off fat to make low-fat gravies.  Serve more fish.  Use less butter,   margarine and other high-fat spreads on bread or vegetables.  Use skim or reconstituted non-fat dry milk for cooking.  Cook with low-fat cheeses.  Substitute low-fat yogurt or cottage cheese for all or part of the sour cream in recipes for sauces, dips or congealed salads.  Use half yogurt/half mayonnaise in salad recipes.  Substitute evaporated skim milk for cream. Evaporated skim milk or reconstituted non-fat dry milk can be whipped and substituted for whipped cream in certain recipes.  Choose fresh fruits for dessert instead of high-fat foods such as pies or  cakes. Fruits are naturally low in fat. When Dining Out   Order low-fat appetizers such as fruit or vegetable juice, pasta with vegetables or tomato sauce.  Select clear, rather than cream soups.  Ask that dressings and gravies be served on the side. Then use less of them.  Order foods that are baked, broiled, poached, steamed, stir-fried, or roasted.  Ask for margarine instead of butter, and use only a small amount.  Drink sparkling water, unsweetened tea or coffee, or diet soft drinks instead of alcohol or other sweet beverages. QUESTIONS AND ANSWERS ABOUT OTHER FATS IN THE BLOOD: SATURATED FAT, TRANS FAT, AND CHOLESTEROL What is trans fat? Trans fat is a type of fat that is formed when vegetable oil is hardened through a process called hydrogenation. This process helps makes foods more solid, gives them shape, and prolongs their shelf life. Trans fats are also called hydrogenated or partially hydrogenated oils.  What do saturated fat, trans fat, and cholesterol in foods have to do with heart disease? Saturated fat, trans fat, and cholesterol in the diet all raise the level of LDL "bad" cholesterol in the blood. The higher the LDL cholesterol, the greater the risk for coronary heart disease (CHD). Saturated fat and trans fat raise LDL similarly.  What foods contain saturated fat, trans fat, and cholesterol? High amounts of saturated fat are found in animal products, such as fatty cuts of meat, chicken skin, and full-fat dairy products like butter, whole milk, cream, and cheese, and in tropical vegetable oils such as palm, palm kernel, and coconut oil. Trans fat is found in some of the same foods as saturated fat, such as vegetable shortening, some margarines (especially hard or stick margarine), crackers, cookies, baked goods, fried foods, salad dressings, and other processed foods made with partially hydrogenated vegetable oils. Small amounts of trans fat also occur naturally in some animal  products, such as milk products, beef, and lamb. Foods high in cholesterol include liver, other organ meats, egg yolks, shrimp, and full-fat dairy products. How can I use the new food label to make heart-healthy food choices? Check the Nutrition Facts panel of the food label. Choose foods lower in saturated fat, trans fat, and cholesterol. For saturated fat and cholesterol, you can also use the Percent Daily Value (%DV): 5% DV or less is low, and 20% DV or more is high. (There is no %DV for trans fat.) Use the Nutrition Facts panel to choose foods low in saturated fat and cholesterol, and if the trans fat is not listed, read the ingredients and limit products that list shortening or hydrogenated or partially hydrogenated vegetable oil, which tend to be high in trans fat. POINTS TO REMEMBER:   Discuss your risk for heart disease with your caregivers, and take steps to reduce risk factors.  Change your diet. Choose foods that are low in saturated fat, trans fat, and cholesterol.  Add exercise to your daily routine if   it is not already being done. Participate in physical activity of moderate intensity, like brisk walking, for at least 30 minutes on most, and preferably all days of the week. No time? Break the 30 minutes into three, 10-minute segments during the day.  Stop smoking. If you do smoke, contact your caregiver to discuss ways in which they can help you quit.  Do not use street drugs.  Maintain a normal weight.  Maintain a healthy blood pressure.  Keep up with your blood work for checking the fats in your blood as directed by your caregiver. Document Released: 08/21/2004 Document Revised: 05/04/2012 Document Reviewed: 03/19/2009 [employer] Patient Information 2014 [employer].  

## 2013-10-05 NOTE — Progress Notes (Signed)
Subjective:    Patient ID: Autumn Davis, female    DOB: 09-22-1986, 27 y.o.   MRN: [id number] Computer went out while seeing this patient so note had to be redone HPI Patient here today for follow up of medical problems- she still has not found out exactly what is going on with her legs and her pain- due to see neurologist again in 1 week - they still think it is some autoimmune response to something- SHe is stable right now with no worsening of symptoms. Patient Active Problem List   Diagnosis Date Noted  . Quadriplegia 01/10/2013  . Convulsions 08/26/2012  . Hyperlipidemia 08/26/2012  . IBS (irritable bowel syndrome) 01/22/2012  . Depression 01/22/2012  . Bronchitis, chronic/intermittent 01/22/2012  . Abdominal pain, generalized 01/22/2012  . Back pain, lumbosacral 01/22/2012  . GERD (gastroesophageal reflux disease)   . Seizures   . Thyroid disease    Outpatient Encounter Prescriptions as of 10/05/2013  Medication Sig  . acetaZOLAMIDE (DIAMOX) 250 MG tablet Take 125 mg by mouth 3 (three) times daily.  Marland Kitchen HYDROcodone-acetaminophen (LORTAB) 5-325 MG per tablet Take 1 tablet by mouth every 6 (six) hours as needed for pain.  Marland Kitchen lamoTRIgine (LAMICTAL) 200 MG tablet Take 75 mg by mouth daily.   . metaxalone (SKELAXIN) 800 MG tablet Take 800 mg by mouth 4 (four) times daily as needed for pain.  . montelukast (SINGULAIR) 10 MG tablet Take 1 tablet (10 mg total) by mouth at bedtime.       Review of Systems  HENT: Negative.   Respiratory: Negative.   Cardiovascular: Negative.   Gastrointestinal: Negative.   Neurological: Positive for dizziness, tremors and weakness.       Objective:   Physical Exam  Constitutional: She is oriented to person, place, and time. She appears well-developed and well-nourished.  HENT:  Right Ear: External ear normal.  Left Ear: External ear normal.  Nose: Nose normal.  Mouth/Throat: Oropharynx is clear and moist.  Eyes: EOM are normal.   Neck: Trachea normal, normal range of motion and full passive range of motion without pain. Neck supple. No JVD present. Carotid bruit is not present. No thyromegaly present.  Cardiovascular: Normal rate, regular rhythm, normal heart sounds and intact distal pulses.  Exam reveals no gallop and no friction rub.   No murmur heard. Pulmonary/Chest: Effort normal and breath sounds normal.  Abdominal: Soft. Bowel sounds are normal. She exhibits no distension and no mass. There is no tenderness.  Musculoskeletal: Normal range of motion.  Lymphadenopathy:    She has no cervical adenopathy.  Neurological: She is alert and oriented to person, place, and time.  No DTR's bil lower ext. Weakness of bil lower ext  Skin: Skin is warm and dry.  Psychiatric: She has a normal mood and affect. Her behavior is normal. Judgment and thought content normal.    BP 123/79  Pulse 82  Temp(Src) 99.5 F (37.5 C) (Oral)  Wt 181 lb (82.101 kg)       Assessment & Plan:   1. Pelvic pain in female   2. Hyperlipidemia   3. GERD (gastroesophageal reflux disease)   4. IBS (irritable bowel syndrome)   5. Depression    No orders of the defined types were placed in this encounter.   Meds ordered this encounter  Medications  . HYDROcodone-acetaminophen (LORTAB) 5-325 MG per tablet    Sig: Take 1 tablet by mouth every 6 (six) hours as needed.    Dispense:  40  tablet    Refill:  0    Order Specific Question:  Supervising Provider    Answer:  Ernestina Penna [[id number]]  . ipratropium-albuterol (DUONEB) 0.5-2.5 (3) MG/3ML SOLN    Sig: Take 3 mLs by nebulization every 4 (four) hours as needed.    Dispense:  360 mL    Refill:  4    Order Specific Question:  Supervising Provider    Answer:  Ernestina Penna [[id number]]  . ketorolac (TORADOL) injection 60 mg    Sig:     Continue all meds Labs pending Diet and exercise encouraged Health maintenance reviewed Follow up in 3 months  Mary-Margaret Daphine Deutscher, FNP

## 2013-10-07 LAB — NMR, LIPOPROFILE
Cholesterol: 210 mg/dL — ABNORMAL HIGH (ref <200)
HDL Cholesterol by NMR: 42 mg/dL (ref >=40)
HDL Particle Number: 24.5 umol/L — ABNORMAL LOW (ref >=30.5)
LDL Particle Number: 2435 nmol/L — ABNORMAL HIGH (ref <1000)
LDLC SERPL CALC-MCNC: 139 mg/dL — ABNORMAL HIGH (ref <100)
Triglycerides by NMR: 145 mg/dL (ref <150)

## 2013-10-07 LAB — CMP14+EGFR
Albumin: 4.3 g/dL (ref 3.5–5.5)
BUN/Creatinine Ratio: 8 (ref 8–20)
BUN: 6 mg/dL (ref 6–20)
CO2: 14 mmol/L — ABNORMAL LOW (ref 18–29)
Calcium: 9.7 mg/dL (ref 8.7–10.2)
Chloride: 110 mmol/L — ABNORMAL HIGH (ref 97–108)
Creatinine, Ser: 0.75 mg/dL (ref 0.57–1.00)
Globulin, Total: 2.6 g/dL (ref 1.5–4.5)
Glucose: 94 mg/dL (ref 65–99)
Total Protein: 6.9 g/dL (ref 6.0–8.5)

## 2013-10-18 ENCOUNTER — Telehealth: Payer: Self-pay | Admitting: Family Medicine

## 2013-10-18 ENCOUNTER — Encounter: Payer: Self-pay | Admitting: Nurse Practitioner

## 2013-10-18 NOTE — Telephone Encounter (Signed)
Chart says that can't take statin due to autoimmune problems- tell patient to let us know after he sees specialist if it is okay to put on statin

## 2013-10-18 NOTE — Telephone Encounter (Signed)
Patient talked with her mom and they have decided to go back on a stating and she would just deal with the muscle pain because they really want to get that number down. Also her throat is swelling again where the uvula is and wanted to know what else they could do for that?

## 2013-10-18 NOTE — Telephone Encounter (Signed)
Patient aware will see them this month and call us back.

## 2013-10-18 NOTE — Telephone Encounter (Signed)
Try benadryl OTC - must be allergic reaction to something

## 2013-10-19 ENCOUNTER — Other Ambulatory Visit: Payer: Self-pay | Admitting: Nurse Practitioner

## 2013-10-19 MED ORDER — ATORVASTATIN CALCIUM 40 MG PO TABS: 40.0000 mg | ORAL_TABLET | Freq: Every day | ORAL | Status: DC

## 2013-10-31 ENCOUNTER — Encounter: Payer: 59 | Admitting: Physical Medicine & Rehabilitation

## 2013-11-01 ENCOUNTER — Other Ambulatory Visit: Payer: Self-pay | Admitting: Nurse Practitioner

## 2013-11-01 DIAGNOSIS — G839 Paralytic syndrome, unspecified: Secondary | ICD-10-CM

## 2013-11-02 ENCOUNTER — Telehealth: Payer: Self-pay | Admitting: Nurse Practitioner

## 2013-11-08 ENCOUNTER — Other Ambulatory Visit (INDEPENDENT_AMBULATORY_CARE_PROVIDER_SITE_OTHER): Payer: PRIVATE HEALTH INSURANCE

## 2013-11-08 ENCOUNTER — Other Ambulatory Visit: Payer: Self-pay | Admitting: Nurse Practitioner

## 2013-11-08 DIAGNOSIS — R52 Pain, unspecified: Secondary | ICD-10-CM

## 2013-11-08 NOTE — Progress Notes (Signed)
Patient came in with orders from Dr. Ave Filter

## 2013-11-08 NOTE — Progress Notes (Signed)
Pt came in for labs for Dr. Lynnell Grain for ceruloplasmic, serum copper, ser rate ana cbc with ff, acetazolamide level, elevtrolytes dx v58.69, muscle spasms dysphagia

## 2013-11-14 ENCOUNTER — Encounter: Payer: 59 | Attending: Physical Medicine & Rehabilitation | Admitting: Physical Medicine & Rehabilitation

## 2013-11-14 ENCOUNTER — Ambulatory Visit
Admission: RE | Admit: 2013-11-14 | Discharge: 2013-11-14 | Disposition: A | Discharge status: Home / Self Care | Payer: 59 | Source: Ambulatory Visit | Attending: Physical Medicine & Rehabilitation | Admitting: Physical Medicine & Rehabilitation

## 2013-11-14 ENCOUNTER — Encounter: Payer: Self-pay | Admitting: Physical Medicine & Rehabilitation

## 2013-11-14 VITALS — BP 124/72 | HR 87 | Resp 14 | Ht 68.0 in | Wt 181.0 lb

## 2013-11-14 DIAGNOSIS — G40909 Epilepsy, unspecified, not intractable, without status epilepticus: Secondary | ICD-10-CM | POA: Insufficient documentation

## 2013-11-14 DIAGNOSIS — IMO0001 Reserved for inherently not codable concepts without codable children: Secondary | ICD-10-CM

## 2013-11-14 DIAGNOSIS — M791 Myalgia, unspecified site: Secondary | ICD-10-CM

## 2013-11-14 DIAGNOSIS — M545 Low back pain, unspecified: Secondary | ICD-10-CM

## 2013-11-14 DIAGNOSIS — Z5181 Encounter for therapeutic drug level monitoring: Secondary | ICD-10-CM

## 2013-11-14 DIAGNOSIS — R1310 Dysphagia, unspecified: Secondary | ICD-10-CM | POA: Insufficient documentation

## 2013-11-14 DIAGNOSIS — M546 Pain in thoracic spine: Secondary | ICD-10-CM

## 2013-11-14 DIAGNOSIS — R569 Unspecified convulsions: Secondary | ICD-10-CM

## 2013-11-14 DIAGNOSIS — R109 Unspecified abdominal pain: Secondary | ICD-10-CM | POA: Insufficient documentation

## 2013-11-14 DIAGNOSIS — M62838 Other muscle spasm: Secondary | ICD-10-CM

## 2013-11-14 DIAGNOSIS — Z79899 Other long term (current) drug therapy: Secondary | ICD-10-CM

## 2013-11-14 DIAGNOSIS — R5381 Other malaise: Secondary | ICD-10-CM | POA: Insufficient documentation

## 2013-11-14 MED ORDER — BACLOFEN 10 MG PO TABS: 10.0000 mg | ORAL_TABLET | Freq: Four times a day (QID) | ORAL | Status: DC

## 2013-11-14 MED ORDER — MELOXICAM 15 MG PO TABS: 15.0000 mg | ORAL_TABLET | Freq: Every day | ORAL | Status: DC

## 2013-11-14 NOTE — Progress Notes (Signed)
Subjective:    Patient ID: Autumn Davis, female    DOB: 08-11-1986, 27 y.o.   MRN: [id number]  HPI  This is an initial evaluation for Autumn Davis who is here with a 2.5 year history of right flank pain and increasing weakness and spasms. Spasms are most frequent in her back and neck.  She has substantial pain in her arms and legs as well as the joints of her hands/feet.   She has had seizure as well along with the above problems which apparently started around the same time. She is on diamox and lamictal for these currently which seems to have been the best combination. She is having seizures about 3 x per week. They most frequently are absence but apparently has had tonic-clonic seizures as well. She has been followed by a Dr. Ciro Backer for the seizures. Per report EEG's have been negative and there has been discussion of pseudoseizures and conversion disorder as a result.  She has seen a rheumatologist in [place] with no definitive diagnosis. She has seen numerous specialists over the last couple years in fact. I found MRI's of her head, neck and thoracic spine from 2/14 which were clean. Labwork has largely been unremarkable except for a +ANA. She does have elevated lipids.   She takes lortab for pain control but these don't tend to help much. She occasionally uses tylenol. Robaxin is rx'ed for spasm which may help a slight bit..   From an exercise standpoint she is doing some PT where she will use the stationary bike or treadmill. She has been back with PT for about a month. She is doing PT, OT, SLP at [facility]. SLP is seeing her for swallowing exercises to strengthen her swallow. She has more problems with liquids as opposed to solids. She can walk around her home without having to stop, but not much more usually. It often will depend on how recently she's had a seizure. She was fatigued walking form her car to my office today.      Pain Inventory Average Pain 7 Pain  Right Now 9 My pain is constant, sharp, burning, dull, stabbing, tingling and aching  In the last 24 hours, has pain interfered with the following? General activity 8 Relation with others 8 Enjoyment of life 9 What TIME of day is your pain at its worst? evening, night Sleep (in general) Poor  Pain is worse with: walking, bending, standing and some activites Pain improves with: rest, heat/ice and medication Relief from Meds: 4  Mobility walk with assistance use a cane use a walker ability to climb steps?  no do you drive?  no use a wheelchair transfers alone  Function disabled: date disabled 11/2011 I need assistance with the following:  dressing, bathing, meal prep, household duties and shopping  Neuro/Psych bladder control problems bowel control problems weakness numbness tremor tingling trouble walking spasms dizziness confusion  Prior Studies Any changes since last visit?  yes x-rays CT/MRI nerve study  Physicians involved in your care Any changes since last visit?  no   Family History  Problem Relation Age of Onset  . Thyroid disease Mother   . Cancer Maternal Grandmother     lung  . Cancer Paternal Grandmother     colon/pancreatiec/lymphoma   History   Social History  . Marital Status: Married    Spouse Name: N/A    Number of Children: N/A  . Years of Education: N/A   Social History Main Topics  .  Smoking status: Never Smoker   . Smokeless tobacco: None  . Alcohol Use: No  . Drug Use: No  . Sexual Activity: None   Other Topics Concern  . None   Social History Narrative  . None   Past Surgical History  Procedure Laterality Date  . Knee surgery  2002/2003    bil  . Rectal surgery  correction of prolapse    2010   Past Medical History  Diagnosis Date  . GERD (gastroesophageal reflux disease)   . Hyperlipidemia   . Seizures   . Thyroid disease   . Bronchitis, chronic/intermittent 01/22/2012  . Depression 01/22/2012  . IBS  (irritable bowel syndrome) 01/22/2012  . Seizures    BP 124/72  Pulse 87  Resp 14  Ht 5\' 8"  (1.727 m)  Wt 181 lb (82.101 kg)  BMI 27.53 kg/m2  SpO2 99%     Review of Systems  Constitutional: Positive for diaphoresis, appetite change and unexpected weight change.  Respiratory: Positive for cough, shortness of breath and wheezing.   Gastrointestinal: Positive for nausea, vomiting, abdominal pain and constipation.  Genitourinary:       Bowel and bladder control problems  Musculoskeletal: Positive for back pain and gait problem.  Neurological: Positive for tremors, weakness and numbness.       Tingling, spasms  Psychiatric/Behavioral: Positive for confusion.  All other systems reviewed and are negative.       Objective:   Physical Exam  General: Alert and oriented x 3, No apparent distress HEENT: Head is normocephalic, atraumatic, PERRLA, EOMI, sclera anicteric, oral mucosa pink and moist, dentition intact, ext ear canals clear,  Neck: Supple without JVD or lymphadenopathy Heart: Reg rate and rhythm. No murmurs rubs or gallops Chest: CTA bilaterally without wheezes, rales, or rhonchi; no distress Abdomen: Soft, non-tender, non-distended, bowel sounds positive. Extremities: No clubbing, cyanosis, or edema. Pulses are 2+ Skin: Clean and intact without signs of breakdown. She had a malar rash over the neck. Neuro: Pt displays generally intact insight and awareness. CN exam appears intact without focal abnormalities today. She has good vocal quality, cough. Vision intact. Strength is grossly 4/5 in all 4 limbs. Sensation is decreased below the waist bilaterally, distal more than proximal to PP/LT. She has decreased proprioception in both legs as well. She has tight pecs bilaterally as well as hamstrings. She has substantially tight right lumbar paraspinal muscles as well. DTR's as a whole appear to brisk and 2 to 3+. Resting tone at her hamstrings and pecs is grossly 2/4. She walks with  a steppage gait pattern. Knees tend to stay bent, and it appears as if her knees are going to buckle with weight bearing. Her weight shift and stability is much better with her straight cane. Without the cane, her gait becomes more wide based and swaying.   Musculoskeletal: She has generalized joint pain but now swelling or gross abnormalities. She has a lot of tenderness in the areas of muscle and tendon tightness. She has pain along her right lower rib cage and obliques. She has a head forward posture and fair trunk positioning while sitting. Need cues for full lumbar extension when standing.  Psych: Pt's affect is appropriate. Pt is cooperative         Assessment & Plan:  1. Chronic seizure disorder---could these be myoclonic jerks/myotonia/ or some other abnormal muscle movement as opposed to seizures? 2. Chronic muscle spasms, weakness with associated pain disorder.  3. Chronic dysphagia 4. Right low back/flank pain  Plan: 1. This patient needs neuromuscular assessment and further work up in my opinion. She has a potential appointment with neurology at [facility] to further assess seizures. It would be my recommendation to see their neuromuscular clinic for full assessment. 2. DC skelaxin and begin trial of baclofen for muscle spasms. Consider klonopin trial. 3. Would like to see copy of the EMG results. Mother will work on finding these. 4. Begin a trial of meloxicam 15mg  daily 5. Xrays of the lumbar spine to rule out any gross abnormality which could account for pain 6. Follow up with me in about 4 weeks. 45 minutes of face to face patient care time were spent during this visit. All questions were encouraged and answered.

## 2013-11-14 NOTE — Patient Instructions (Signed)
START BACLOFEN 10MG  TWICE DAILY FOR 4 DAYS THEN THREE X DAILY FOR 4 DAYS THEN FOUR TIMES DAILY

## 2013-11-15 NOTE — Telephone Encounter (Signed)
Please let Autumn Davis know that there are no abnormalities on her lumbar spine films.

## 2013-11-16 NOTE — Telephone Encounter (Signed)
Patient aware of no abnormalities on her lumbar films

## 2013-11-16 NOTE — Telephone Encounter (Signed)
Left message for patient to call office regarding her imaging results.

## 2013-11-21 LAB — ACETAZOLAMIDE, (DIAMOX), S/P: Acetazolamide: 10.1 ug/ml

## 2013-11-23 ENCOUNTER — Telehealth: Payer: Self-pay

## 2013-11-23 NOTE — Telephone Encounter (Signed)
Would hold the meloxicam for the time being and see if the diarrhea resolves. Continue to push fluids and po intake.

## 2013-11-23 NOTE — Telephone Encounter (Signed)
Contacted patient to inform her to hold the meloxicam for the time being to see if the diarrhea resolves and also push fluids.

## 2013-11-23 NOTE — Telephone Encounter (Signed)
Patient called complaining of diarrhea for about a week now.  She is unsure if it is medication related or if she has a stomach bug.  Patient has also had her seizure medication increased.  She has been trying to hydrate but she is having trouble eating.  She was wondering if it could be the meloxicam because the diarrhea gets worse at night after she takes the medication.  Please advise.

## 2013-11-29 ENCOUNTER — Telehealth: Payer: Self-pay | Admitting: Nurse Practitioner

## 2013-12-05 ENCOUNTER — Other Ambulatory Visit: Payer: Self-pay | Admitting: Nurse Practitioner

## 2013-12-05 MED ORDER — PROMETHAZINE HCL 12.5 MG PO TABS: 12.5000 mg | ORAL_TABLET | Freq: Three times a day (TID) | ORAL | Status: DC | PRN

## 2013-12-07 ENCOUNTER — Telehealth: Payer: Self-pay | Admitting: Nurse Practitioner

## 2013-12-14 ENCOUNTER — Encounter: Payer: 59 | Attending: Physical Medicine & Rehabilitation | Admitting: Physical Medicine & Rehabilitation

## 2013-12-14 ENCOUNTER — Encounter: Payer: Self-pay | Admitting: Physical Medicine & Rehabilitation

## 2013-12-14 VITALS — BP 130/76 | HR 87 | Resp 14 | Ht 68.0 in | Wt 180.0 lb

## 2013-12-14 DIAGNOSIS — F32A Depression, unspecified: Secondary | ICD-10-CM

## 2013-12-14 DIAGNOSIS — M545 Low back pain, unspecified: Secondary | ICD-10-CM

## 2013-12-14 DIAGNOSIS — M546 Pain in thoracic spine: Secondary | ICD-10-CM

## 2013-12-14 DIAGNOSIS — M62838 Other muscle spasm: Secondary | ICD-10-CM

## 2013-12-14 DIAGNOSIS — IMO0001 Reserved for inherently not codable concepts without codable children: Secondary | ICD-10-CM

## 2013-12-14 DIAGNOSIS — M791 Myalgia, unspecified site: Secondary | ICD-10-CM

## 2013-12-14 DIAGNOSIS — K589 Irritable bowel syndrome without diarrhea: Secondary | ICD-10-CM

## 2013-12-14 DIAGNOSIS — R569 Unspecified convulsions: Secondary | ICD-10-CM

## 2013-12-14 DIAGNOSIS — M797 Fibromyalgia: Secondary | ICD-10-CM | POA: Insufficient documentation

## 2013-12-14 DIAGNOSIS — F3289 Other specified depressive episodes: Secondary | ICD-10-CM

## 2013-12-14 DIAGNOSIS — F329 Major depressive disorder, single episode, unspecified: Secondary | ICD-10-CM

## 2013-12-14 MED ORDER — BACLOFEN 20 MG PO TABS: 20.0000 mg | ORAL_TABLET | Freq: Four times a day (QID) | ORAL | Status: DC

## 2013-12-14 MED ORDER — OXYCODONE-ACETAMINOPHEN 5-325 MG PO TABS: 1.0000 | ORAL_TABLET | Freq: Four times a day (QID) | ORAL | Status: DC | PRN

## 2013-12-14 MED ORDER — AMITRIPTYLINE HCL 10 MG PO TABS: 10.0000 mg | ORAL_TABLET | Freq: Every day | ORAL | Status: DC

## 2013-12-14 NOTE — Progress Notes (Signed)
Subjective:    Patient ID: Autumn Davis, female    DOB: June 16, 1986, 28 y.o.   MRN: [id number]  HPI  This is a follow up visit for [facility] with her multiple problems.   She felt that the meloxicam and baclofen were helpful initially but then she back slid after a couple weeks. She has had problems with ongoing tightness, diffuse pain, insomnia, anxiety. Her neurologist increased her diamox which seems to have helped her migraines. The hydrocodone doesn't seem to be touching her breakthrough pain at this point. She's had no issues with the baclofen.   There is a persistent rash which seems to worsen with stress.   Other than her neurologist, she hasn't seen anyone else. Her mother was unable to acquire her recent EMG testing.   Pain Inventory Average Pain 8 Pain Right Now 10 My pain is constant, sharp, burning, dull, stabbing, tingling and aching  In the last 24 hours, has pain interfered with the following? General activity 10 Relation with others 10 Enjoyment of life 10 What TIME of day is your pain at its worst? evening, night Sleep (in general) Poor  Pain is worse with: walking, bending, sitting and standing Pain improves with: rest, heat/ice, therapy/exercise, medication and injections Relief from Meds: 1  Mobility walk with assistance use a cane use a walker ability to climb steps?  no do you drive?  no use a wheelchair  Function disabled: date disabled na I need assistance with the following:  dressing, bathing, toileting, meal prep, household duties and shopping  Neuro/Psych bladder control problems weakness numbness tremor tingling trouble walking spasms dizziness  Prior Studies Any changes since last visit?  no  Physicians involved in your care Any changes since last visit?  no   Family History  Problem Relation Age of Onset  . Thyroid disease Mother   . Cancer Maternal Grandmother     lung  . Cancer Paternal Grandmother    colon/pancreatiec/lymphoma   History   Social History  . Marital Status: Married    Spouse Name: N/A    Number of Children: N/A  . Years of Education: N/A   Social History Main Topics  . Smoking status: Never Smoker   . Smokeless tobacco: None  . Alcohol Use: No  . Drug Use: No  . Sexual Activity: None   Other Topics Concern  . None   Social History Narrative  . None   Past Surgical History  Procedure Laterality Date  . Knee surgery  2002/2003    bil  . Rectal surgery  correction of prolapse    2010   Past Medical History  Diagnosis Date  . GERD (gastroesophageal reflux disease)   . Hyperlipidemia   . Seizures   . Thyroid disease   . Bronchitis, chronic/intermittent 01/22/2012  . Depression 01/22/2012  . IBS (irritable bowel syndrome) 01/22/2012  . Seizures    BP 130/76  Pulse 87  Resp 14  Ht 5\' 8"  (1.727 m)  Wt 180 lb (81.647 kg)  BMI 27.38 kg/m2  SpO2 99%  Opioid Risk Score:   Fall Risk Score: High Fall Risk (>13 points) (pt educated on fall risk, brochure offered to pt.)   Review of Systems  Constitutional: Positive for diaphoresis.  Respiratory: Positive for cough, shortness of breath and wheezing.   Gastrointestinal: Positive for nausea, vomiting, abdominal pain, diarrhea and constipation.  Genitourinary:       Bladder control problems  Musculoskeletal: Positive for back pain.  Neurological:  Positive for dizziness, tremors, weakness and numbness.       Tingling, spasms  Hematological: Bruises/bleeds easily.  All other systems reviewed and are negative.       Objective:   Physical Exam General: Alert and oriented x 3, No apparent distress  HEENT: Head is normocephalic, atraumatic, PERRLA, EOMI, sclera anicteric, oral mucosa pink and moist, dentition intact, ext ear canals clear,  Neck: Supple without JVD or lymphadenopathy  Heart: Reg rate and rhythm. No murmurs rubs or gallops  Chest: CTA bilaterally without wheezes, rales, or rhonchi; no  distress  Abdomen: Soft, non-tender, non-distended, bowel sounds positive.  Extremities: No clubbing, cyanosis, or edema. Pulses are 2+  Skin: Clean and intact without signs of breakdown. She had a malar rash over the neck.  Neuro: Pt displays generally intact insight and awareness. CN exam appears intact without focal abnormalities today. She has good vocal quality, cough. Vision intact. Strength is grossly 3-4/5 in the upper limbs with increased rigidity noted. Sensation is decreased below the waist bilaterally, distal more than proximal to PP/LT. She has decreased proprioception in both legs as well. She has tight pecs bilaterally as well as hamstrings. She has substantially tight right lumbar paraspinal muscles as well. DTR's were decreased today in general at 1+ (?). Resting tone at her hamstrings and pecs is grossly 2/4. She walks with a steppage gait pattern still. She has a hard time straightening out her lumbar spine.    Musculoskeletal: She has generalized joint pain but now swelling or gross abnormalities. She has a lot of tenderness in the areas of muscle and tendon tightness. She has pain along her right lower rib cage and obliques. She has a head forward posture and fair trunk positioning while sitting.  Psych: appears fatigued and anxious today  Assessment & Plan:   1. Chronic seizure disorder---could these be myoclonic jerks/myotonia/ or some other abnormal muscle movement as opposed to seizures?  2. Chronic muscle spasms, weakness with associated pain disorder.  3. Chronic dysphagia  4. Right low back/flank pain 5. I believe that her pain presentation is most consistent with fibromyalgia in the setting of whatever potential neuromuscular disorder is going on.   6. Anxiety with depression   Plan:  1. Added low dose elavil 10-20mg  qhs for sleep/pain 2. Increase baclofen to 20mg  for muscle spasms. Consider klonopin trial in the future. 3. Follow up with neurology regarding the  management of her seizures. Would like to see the results of her EMG. Also would like to trial gabapentin or lyrica.  ?new EMG 4. Continue meloxicam 15mg  daily  5. Oxycodone 5mg  fr breakthrough pain in place of hydrocodone, #120 6. Follow up with me in about 4 weeks. 45 minutes of face to face patient care time were spent during this visit. All questions were encouraged and answered.

## 2013-12-14 NOTE — Patient Instructions (Addendum)
PLEASE CALL ME WITH ANY PROBLEMS OR QUESTIONS [phone]).     ASK YOUR NEUROLOGIST ABOUT THE POTENTIAL USE OF NEURONTIN OR LYRICA FOR YOUR PAIN SYMPTOMS.

## 2013-12-17 ENCOUNTER — Other Ambulatory Visit: Payer: Self-pay | Admitting: *Deleted

## 2013-12-17 MED ORDER — PROMETHAZINE HCL 12.5 MG PO TABS: 12.5000 mg | ORAL_TABLET | Freq: Three times a day (TID) | ORAL | Status: DC | PRN

## 2013-12-20 ENCOUNTER — Telehealth: Payer: Self-pay | Admitting: *Deleted

## 2013-12-20 ENCOUNTER — Telehealth: Payer: Self-pay | Admitting: Family Medicine

## 2013-12-20 NOTE — Telephone Encounter (Signed)
Needs to see eye doctor an neurologist ASAP

## 2013-12-20 NOTE — Telephone Encounter (Signed)
Patient aware and will see her Tues and patient has appt tomorrow with dr. Christell Constantmoore

## 2013-12-20 NOTE — Telephone Encounter (Signed)
Patient has been experiencing progressive weakness over past 2 years. Now she has difficulty opening a bottle or toothpaste. Appt scheduled for tomorrow morning.

## 2013-12-20 NOTE — Telephone Encounter (Signed)
Pupils were very dilated today at therapy though they were reactive to light, walking with walker now instead of cane, rash all over her chest, slurred speech, walking on the sides of her feet again, not sleeping, she states she cant see out of her left peripheral field and double vision out of that left eye. They did not treat her today due to her symptoms.

## 2013-12-21 ENCOUNTER — Ambulatory Visit (INDEPENDENT_AMBULATORY_CARE_PROVIDER_SITE_OTHER): Payer: 59 | Admitting: Family Medicine

## 2013-12-21 ENCOUNTER — Ambulatory Visit (INDEPENDENT_AMBULATORY_CARE_PROVIDER_SITE_OTHER): Payer: 59

## 2013-12-21 ENCOUNTER — Encounter: Payer: Self-pay | Admitting: Family Medicine

## 2013-12-21 VITALS — BP 142/92 | HR 101 | Temp 99.0°F | Ht 68.0 in | Wt 182.0 lb

## 2013-12-21 DIAGNOSIS — R059 Cough, unspecified: Secondary | ICD-10-CM

## 2013-12-21 DIAGNOSIS — G589 Mononeuropathy, unspecified: Secondary | ICD-10-CM

## 2013-12-21 DIAGNOSIS — R569 Unspecified convulsions: Secondary | ICD-10-CM

## 2013-12-21 DIAGNOSIS — R5383 Other fatigue: Secondary | ICD-10-CM

## 2013-12-21 DIAGNOSIS — M546 Pain in thoracic spine: Secondary | ICD-10-CM

## 2013-12-21 DIAGNOSIS — R531 Weakness: Secondary | ICD-10-CM

## 2013-12-21 DIAGNOSIS — R05 Cough: Secondary | ICD-10-CM

## 2013-12-21 DIAGNOSIS — R5381 Other malaise: Secondary | ICD-10-CM

## 2013-12-21 DIAGNOSIS — M62838 Other muscle spasm: Secondary | ICD-10-CM

## 2013-12-21 DIAGNOSIS — G629 Polyneuropathy, unspecified: Secondary | ICD-10-CM

## 2013-12-21 LAB — POCT UA - MICROSCOPIC ONLY
CASTS, UR, LPF, POC: NEGATIVE
CRYSTALS, UR, HPF, POC: NEGATIVE
Mucus, UA: NEGATIVE
YEAST UA: NEGATIVE

## 2013-12-21 LAB — POCT CBC
GRANULOCYTE PERCENT: 62.8 % (ref 37–80)
HEMATOCRIT: 42.3 % (ref 37.7–47.9)
HEMOGLOBIN: 13.6 g/dL (ref 12.2–16.2)
Lymph, poc: 2.8 (ref 0.6–3.4)
MCH, POC: 28.2 pg (ref 27–31.2)
MCHC: 32.3 g/dL (ref 31.8–35.4)
MCV: 87.5 fL (ref 80–97)
MPV: 8.8 fL (ref 0–99.8)
POC GRANULOCYTE: 5.8 (ref 2–6.9)
POC LYMPH %: 30.3 % (ref 10–50)
Platelet Count, POC: 288 10*3/uL (ref 142–424)
RBC: 4.8 M/uL (ref 4.04–5.48)
RDW, POC: 13.9 %
WBC: 9.2 10*3/uL (ref 4.6–10.2)

## 2013-12-21 LAB — POCT URINALYSIS DIPSTICK
Bilirubin, UA: NEGATIVE
Blood, UA: NEGATIVE
Clarity, UA: NEGATIVE
Color, UA: NEGATIVE
Glucose, UA: NEGATIVE
Ketones, UA: NEGATIVE
Leukocytes, UA: NEGATIVE
Nitrite, UA: NEGATIVE
Protein, UA: NEGATIVE
Spec Grav, UA: 1.02
Urobilinogen, UA: NEGATIVE
pH, UA: 5

## 2013-12-21 NOTE — Progress Notes (Signed)
Subjective:    Patient ID: Autumn Davis, female    DOB: 09-06-1986, 28 y.o.   MRN: [id number]  HPI Patient here today for weakness, muscle problems, and seizures. This has been going on and giving progressively worse over the past 2-3 years. She comes to the visit today in a wheelchair and with her father and husband. She has seen multiple neurologists, a rheumatologist and physical therapy. She is taking multiple medications and they are listed below. A lot of time was spent discussing the patient's past history and the cyclic nature of her seizure disorder fatigue weakness pain and paresthesias. As mentioned this has been going on for 2 years or more. It started after a visit on her honeymoon to the [place]. She has had multiple tests done lab work-wise and x-ray wise. She has not had a recent MRI. She has plans to see a neurologist within the next week. She has seen other neurologists in the past. Her medications were reviewed and all efforts were reviewed that have been tried to help her feel better. She was very positive in her demeanor and is anxious to to try to find the cause for her weakness and extremity pain.     Patient Active Problem List   Diagnosis Date Noted  . Fibromyalgia 12/14/2013  . Muscle spasticity 11/14/2013  . Thoracic back pain 11/14/2013  . Lumbago 11/14/2013  . Quadriplegia 01/10/2013  . Convulsions 08/26/2012  . Hyperlipidemia 08/26/2012  . IBS (irritable bowel syndrome) 01/22/2012  . Depression 01/22/2012  . Bronchitis, chronic/intermittent 01/22/2012  . Abdominal pain, generalized 01/22/2012  . Back pain, lumbosacral 01/22/2012  . GERD (gastroesophageal reflux disease)   . Seizures   . Thyroid disease    Outpatient Encounter Prescriptions as of 12/21/2013  Medication Sig  . acetaminophen (TYLENOL) 500 MG tablet Take 500 mg by mouth every 6 (six) hours as needed.  Marland Kitchen acetaZOLAMIDE (DIAMOX) 250 MG tablet Take 125 mg by mouth 3 (three) times daily.    Marland Kitchen albuterol (PROVENTIL HFA;VENTOLIN HFA) 108 (90 BASE) MCG/ACT inhaler Inhale 2 puffs into the lungs every 6 (six) hours as needed for wheezing or shortness of breath.  Marland Kitchen amitriptyline (ELAVIL) 10 MG tablet Take 1-2 tablets (10-20 mg total) by mouth at bedtime.  Marland Kitchen atorvastatin (LIPITOR) 40 MG tablet Take 1 tablet (40 mg total) by mouth daily.  . baclofen (LIORESAL) 10 MG tablet Take 1 tablet (10 mg total) by mouth 4 (four) times daily.  . baclofen (LIORESAL) 20 MG tablet Take 1 tablet (20 mg total) by mouth 4 (four) times daily.  . diphenhydrAMINE (BENADRYL) 25 MG tablet Take 25 mg by mouth every 6 (six) hours as needed. PRN  . docusate sodium (COLACE) 100 MG capsule Take 100 mg by mouth 2 (two) times daily. PRN  . ipratropium-albuterol (DUONEB) 0.5-2.5 (3) MG/3ML SOLN Take 3 mLs by nebulization every 4 (four) hours as needed.  . lamoTRIgine (LAMICTAL) 200 MG tablet Take 75 mg by mouth daily.   . meloxicam (MOBIC) 15 MG tablet Take 1 tablet (15 mg total) by mouth daily.  . metaxalone (SKELAXIN) 800 MG tablet Take 800 mg by mouth 4 (four) times daily as needed for pain.  . montelukast (SINGULAIR) 10 MG tablet Take 1 tablet (10 mg total) by mouth at bedtime.  Marland Kitchen oxyCODONE-acetaminophen (PERCOCET) 5-325 MG per tablet Take 1 tablet by mouth every 6 (six) hours as needed for severe pain.  . promethazine (PHENERGAN) 12.5 MG tablet Take 1 tablet (12.5 mg  Subjective:    Patient ID: Autumn Davis, female    DOB: 1986-09-22, 28 y.o.   MRN: [id number]  HPI Patient here today for weakness, muscle problems, and seizures. This has been going on and giving progressively worse over the past 2-3 years. She comes to the visit today in a wheelchair and with her father and husband. She has seen multiple neurologists, a rheumatologist and physical therapy. She is taking multiple medications and they are listed below. A lot of time was spent discussing the patient's past history and the cyclic nature of her seizure disorder fatigue weakness pain and paresthesias. As mentioned this has been going on for 2 years or more. It started after a visit on her honeymoon to the [place]. She has had multiple tests done lab work-wise and x-ray wise. She has not had a recent MRI. She has plans to see a neurologist within the next week. She has seen other neurologists in the past. Her medications were reviewed and all efforts were reviewed that have been tried to help her feel better. She was very positive in her demeanor and is anxious to to try to find the cause for her weakness and extremity pain.     Patient Active Problem List   Diagnosis Date Noted  . Fibromyalgia 12/14/2013  . Muscle spasticity 11/14/2013  . Thoracic back pain 11/14/2013  . Lumbago 11/14/2013  . Quadriplegia 01/10/2013  . Convulsions 08/26/2012  . Hyperlipidemia 08/26/2012  . IBS (irritable bowel syndrome) 01/22/2012  . Depression 01/22/2012  . Bronchitis, chronic/intermittent 01/22/2012  . Abdominal pain, generalized 01/22/2012  . Back pain, lumbosacral 01/22/2012  . GERD (gastroesophageal reflux disease)   . Seizures   . Thyroid disease    Outpatient Encounter Prescriptions as of 12/21/2013  Medication Sig  . acetaminophen (TYLENOL) 500 MG tablet Take 500 mg by mouth every 6 (six) hours as needed.  Marland Kitchen acetaZOLAMIDE (DIAMOX) 250 MG tablet Take 125 mg by mouth 3 (three) times daily.    Marland Kitchen albuterol (PROVENTIL HFA;VENTOLIN HFA) 108 (90 BASE) MCG/ACT inhaler Inhale 2 puffs into the lungs every 6 (six) hours as needed for wheezing or shortness of breath.  Marland Kitchen amitriptyline (ELAVIL) 10 MG tablet Take 1-2 tablets (10-20 mg total) by mouth at bedtime.  Marland Kitchen atorvastatin (LIPITOR) 40 MG tablet Take 1 tablet (40 mg total) by mouth daily.  . baclofen (LIORESAL) 10 MG tablet Take 1 tablet (10 mg total) by mouth 4 (four) times daily.  . baclofen (LIORESAL) 20 MG tablet Take 1 tablet (20 mg total) by mouth 4 (four) times daily.  . diphenhydrAMINE (BENADRYL) 25 MG tablet Take 25 mg by mouth every 6 (six) hours as needed. PRN  . docusate sodium (COLACE) 100 MG capsule Take 100 mg by mouth 2 (two) times daily. PRN  . ipratropium-albuterol (DUONEB) 0.5-2.5 (3) MG/3ML SOLN Take 3 mLs by nebulization every 4 (four) hours as needed.  . lamoTRIgine (LAMICTAL) 200 MG tablet Take 75 mg by mouth daily.   . meloxicam (MOBIC) 15 MG tablet Take 1 tablet (15 mg total) by mouth daily.  . metaxalone (SKELAXIN) 800 MG tablet Take 800 mg by mouth 4 (four) times daily as needed for pain.  . montelukast (SINGULAIR) 10 MG tablet Take 1 tablet (10 mg total) by mouth at bedtime.  Marland Kitchen oxyCODONE-acetaminophen (PERCOCET) 5-325 MG per tablet Take 1 tablet by mouth every 6 (six) hours as needed for severe pain.  . promethazine (PHENERGAN) 12.5 MG tablet Take 1 tablet (12.5 mg  Subjective:    Patient ID: Autumn Davis, female    DOB: [birth year], 28 y.o.   MRN: [id number]  HPI Patient here today for weakness, muscle problems, and seizures. This has been going on and giving progressively worse over the past 2-3 years. She comes to the visit today in a wheelchair and with her father and husband. She has seen multiple neurologists, a rheumatologist and physical therapy. She is taking multiple medications and they are listed below. A lot of time was spent discussing the patient's past history and the cyclic nature of her seizure disorder fatigue weakness pain and paresthesias. As mentioned this has been going on for 2 years or more. It started after a visit on her honeymoon to the [place]. She has had multiple tests done lab work-wise and x-ray wise. She has not had a recent MRI. She has plans to see a neurologist within the next week. She has seen other neurologists in the past. Her medications were reviewed and all efforts were reviewed that have been tried to help her feel better. She was very positive in her demeanor and is anxious to to try to find the cause for her weakness and extremity pain.     Patient Active Problem List   Diagnosis Date Noted  . Fibromyalgia 12/14/2013  . Muscle spasticity 11/14/2013  . Thoracic back pain 11/14/2013  . Lumbago 11/14/2013  . Quadriplegia 01/10/2013  . Convulsions 08/26/2012  . Hyperlipidemia 08/26/2012  . IBS (irritable bowel syndrome) 01/22/2012  . Depression 01/22/2012  . Bronchitis, chronic/intermittent 01/22/2012  . Abdominal pain, generalized 01/22/2012  . Back pain, lumbosacral 01/22/2012  . GERD (gastroesophageal reflux disease)   . Seizures   . Thyroid disease    Outpatient Encounter Prescriptions as of 12/21/2013  Medication Sig  . acetaminophen (TYLENOL) 500 MG tablet Take 500 mg by mouth every 6 (six) hours as needed.  Marland Kitchen acetaZOLAMIDE (DIAMOX) 250 MG tablet Take 125 mg by mouth 3 (three) times daily.    Marland Kitchen albuterol (PROVENTIL HFA;VENTOLIN HFA) 108 (90 BASE) MCG/ACT inhaler Inhale 2 puffs into the lungs every 6 (six) hours as needed for wheezing or shortness of breath.  Marland Kitchen amitriptyline (ELAVIL) 10 MG tablet Take 1-2 tablets (10-20 mg total) by mouth at bedtime.  Marland Kitchen atorvastatin (LIPITOR) 40 MG tablet Take 1 tablet (40 mg total) by mouth daily.  . baclofen (LIORESAL) 10 MG tablet Take 1 tablet (10 mg total) by mouth 4 (four) times daily.  . baclofen (LIORESAL) 20 MG tablet Take 1 tablet (20 mg total) by mouth 4 (four) times daily.  . diphenhydrAMINE (BENADRYL) 25 MG tablet Take 25 mg by mouth every 6 (six) hours as needed. PRN  . docusate sodium (COLACE) 100 MG capsule Take 100 mg by mouth 2 (two) times daily. PRN  . ipratropium-albuterol (DUONEB) 0.5-2.5 (3) MG/3ML SOLN Take 3 mLs by nebulization every 4 (four) hours as needed.  . lamoTRIgine (LAMICTAL) 200 MG tablet Take 75 mg by mouth daily.   . meloxicam (MOBIC) 15 MG tablet Take 1 tablet (15 mg total) by mouth daily.  . metaxalone (SKELAXIN) 800 MG tablet Take 800 mg by mouth 4 (four) times daily as needed for pain.  . montelukast (SINGULAIR) 10 MG tablet Take 1 tablet (10 mg total) by mouth at bedtime.  Marland Kitchen oxyCODONE-acetaminophen (PERCOCET) 5-325 MG per tablet Take 1 tablet by mouth every 6 (six) hours as needed for severe pain.  . promethazine (PHENERGAN) 12.5 MG tablet Take 1 tablet (12.5 mg

## 2013-12-21 NOTE — Patient Instructions (Signed)
Continue to follow up with the neurologist and the therapist as you are doing Remember to discuss the possibility of using Neurontin, Cymbalta, and a trauma on prednisone. Hopefully an additional MRI will be done to further evaluate the brain and spinal cord. Continue to keep a good diary of what is going on and the cyclic nature of the problems that you are having Take the blood work that was done in December with you to the specialist office Come and pick up the additional lab work that was done today to also take We will call you with the results of his lab work and x-ray once those results are available

## 2013-12-23 ENCOUNTER — Other Ambulatory Visit: Payer: 59

## 2013-12-23 DIAGNOSIS — N390 Urinary tract infection, site not specified: Secondary | ICD-10-CM

## 2013-12-23 LAB — THYROID PANEL WITH TSH
Free Thyroxine Index: 2.1 (ref 1.2–4.9)
T3 Uptake Ratio: 24 % (ref 24–39)
T4, Total: 8.7 ug/dL (ref 4.5–12.0)
TSH: 2.51 u[IU]/mL (ref 0.450–4.500)

## 2013-12-23 LAB — BMP8+EGFR
BUN/Creatinine Ratio: 13 (ref 8–20)
BUN: 9 mg/dL (ref 6–20)
CO2: 16 mmol/L — ABNORMAL LOW (ref 18–29)
Calcium: 9.6 mg/dL (ref 8.7–10.2)
Chloride: 112 mmol/L — ABNORMAL HIGH (ref 97–108)
Creatinine, Ser: 0.68 mg/dL (ref 0.57–1.00)
GFR calc Af Amer: 137 mL/min/1.73
GFR calc non Af Amer: 119 mL/min/1.73
Glucose: 99 mg/dL (ref 65–99)
Potassium: 3.6 mmol/L (ref 3.5–5.2)
Sodium: 145 mmol/L — ABNORMAL HIGH (ref 134–144)

## 2013-12-23 LAB — HEPATIC FUNCTION PANEL
ALT: 12 IU/L (ref 0–32)
AST: 14 IU/L (ref 0–40)
Albumin: 4.6 g/dL (ref 3.5–5.5)
Alkaline Phosphatase: 100 IU/L (ref 39–117)
Bilirubin, Direct: 0.12 mg/dL (ref 0.00–0.40)
Total Bilirubin: 0.4 mg/dL (ref 0.0–1.2)
Total Protein: 6.4 g/dL (ref 6.0–8.5)

## 2013-12-23 LAB — CYCLIC CITRUL PEPTIDE ANTIBODY, IGG/IGA: Cyclic Citrullin Peptide Ab: 2 U (ref 0–19)

## 2013-12-23 LAB — SEDIMENTATION RATE: Sed Rate: 13 mm/h (ref 0–32)

## 2013-12-23 LAB — LYME AB/WESTERN BLOT REFLEX
LYME DISEASE AB, QUANT, IGM: <0.8 {index} (ref 0.00–0.79)
Lyme IgG/IgM Ab: <0.91 {ISR} (ref 0.00–0.90)

## 2013-12-23 NOTE — Progress Notes (Signed)
Pt came in for labs only 

## 2013-12-25 LAB — URINE CULTURE: Organism ID, Bacteria: NO GROWTH

## 2013-12-28 ENCOUNTER — Telehealth: Payer: Self-pay | Admitting: Family Medicine

## 2013-12-29 ENCOUNTER — Other Ambulatory Visit: Payer: Self-pay | Admitting: Nurse Practitioner

## 2013-12-29 MED ORDER — OSELTAMIVIR PHOSPHATE 75 MG PO CAPS: 75.0000 mg | ORAL_CAPSULE | Freq: Every day | ORAL | Status: DC

## 2013-12-30 NOTE — Telephone Encounter (Signed)
Patient is seeing another specialist this week to find something better to treat her with she will keep us informed as she finds out stuff

## 2014-01-01 NOTE — Telephone Encounter (Signed)
ok 

## 2014-01-06 ENCOUNTER — Other Ambulatory Visit: Payer: Self-pay | Admitting: *Deleted

## 2014-01-06 DIAGNOSIS — M545 Low back pain, unspecified: Secondary | ICD-10-CM

## 2014-01-06 DIAGNOSIS — M797 Fibromyalgia: Secondary | ICD-10-CM

## 2014-01-06 DIAGNOSIS — M62838 Other muscle spasm: Secondary | ICD-10-CM

## 2014-01-06 DIAGNOSIS — K589 Irritable bowel syndrome without diarrhea: Secondary | ICD-10-CM

## 2014-01-06 DIAGNOSIS — M791 Myalgia, unspecified site: Secondary | ICD-10-CM

## 2014-01-06 DIAGNOSIS — M546 Pain in thoracic spine: Secondary | ICD-10-CM

## 2014-01-06 MED ORDER — OXYCODONE-ACETAMINOPHEN 5-325 MG PO TABS: 1.0000 | ORAL_TABLET | Freq: Four times a day (QID) | ORAL | Status: DC | PRN

## 2014-01-06 NOTE — Telephone Encounter (Signed)
RX printed early for controlled medication for the visit with RN on 01/09/14 (to be signed by MD) 

## 2014-01-09 ENCOUNTER — Encounter: Payer: Self-pay | Admitting: *Deleted

## 2014-01-09 ENCOUNTER — Encounter: Payer: 59 | Attending: Physical Medicine & Rehabilitation | Admitting: *Deleted

## 2014-01-09 VITALS — BP 142/62 | HR 86 | Resp 14

## 2014-01-09 DIAGNOSIS — K219 Gastro-esophageal reflux disease without esophagitis: Secondary | ICD-10-CM | POA: Insufficient documentation

## 2014-01-09 DIAGNOSIS — R0781 Pleurodynia: Secondary | ICD-10-CM

## 2014-01-09 DIAGNOSIS — IMO0001 Reserved for inherently not codable concepts without codable children: Secondary | ICD-10-CM | POA: Insufficient documentation

## 2014-01-09 DIAGNOSIS — M545 Low back pain, unspecified: Secondary | ICD-10-CM

## 2014-01-09 DIAGNOSIS — E785 Hyperlipidemia, unspecified: Secondary | ICD-10-CM | POA: Insufficient documentation

## 2014-01-09 DIAGNOSIS — R569 Unspecified convulsions: Secondary | ICD-10-CM | POA: Insufficient documentation

## 2014-01-09 DIAGNOSIS — R1310 Dysphagia, unspecified: Secondary | ICD-10-CM | POA: Insufficient documentation

## 2014-01-09 DIAGNOSIS — G4700 Insomnia, unspecified: Secondary | ICD-10-CM | POA: Insufficient documentation

## 2014-01-09 DIAGNOSIS — R109 Unspecified abdominal pain: Secondary | ICD-10-CM | POA: Insufficient documentation

## 2014-01-09 DIAGNOSIS — F411 Generalized anxiety disorder: Secondary | ICD-10-CM | POA: Insufficient documentation

## 2014-01-09 DIAGNOSIS — E079 Disorder of thyroid, unspecified: Secondary | ICD-10-CM | POA: Insufficient documentation

## 2014-01-09 DIAGNOSIS — M62838 Other muscle spasm: Secondary | ICD-10-CM

## 2014-01-09 NOTE — Patient Instructions (Signed)
Has pre scheduled appt with Riley KillSwartz

## 2014-01-09 NOTE — Progress Notes (Signed)
Here for pill count and medication refills. Oxycodone acetaminophen 5-325 # 120  Fill date  12/14/13   Today NV# 41   VSS  She is having a good day and today she is ambulating with her walker but at other times she has to use her wheel chair.  Her pain is mainly centered in her ribs and the oxycodone doesn't help much but this pain but does help with the back pain.She has many questions about her positive ANA tests that no one has definitively been able to tell her what is wrong with her.  [facility] as well as a rheumatologist and others.  She feels she has been sent from one to the other but not given an answer.  She says they think it points to MS though she has not shown to have lesions on her MRI which was done last year. I had nothing I could offer her but she appreciated just being able to talk about it with me.  She has a pre scheduled appointment next month with Dr Riley KillSwartz.  Her Pill count was appropriate and a refill was given.  She has had 2 falls since last visit,which makes her a high fall risk, though her PT has taught her how to fall to avoid injury if this happens.  I have given her the handout on fall prevention in the home.

## 2014-02-01 ENCOUNTER — Ambulatory Visit (INDEPENDENT_AMBULATORY_CARE_PROVIDER_SITE_OTHER): Payer: 59 | Admitting: Family Medicine

## 2014-02-01 ENCOUNTER — Telehealth: Payer: Self-pay | Admitting: Nurse Practitioner

## 2014-02-01 VITALS — BP 123/84 | HR 86 | Temp 97.9°F | Ht 68.0 in | Wt 180.0 lb

## 2014-02-01 DIAGNOSIS — J069 Acute upper respiratory infection, unspecified: Secondary | ICD-10-CM

## 2014-02-01 DIAGNOSIS — B370 Candidal stomatitis: Secondary | ICD-10-CM

## 2014-02-01 MED ORDER — NYSTATIN 100000 UNIT/ML MT SUSP: 5.0000 mL | Freq: Four times a day (QID) | OROMUCOSAL | Status: DC

## 2014-02-01 MED ORDER — AZITHROMYCIN 250 MG PO TABS: ORAL_TABLET | ORAL | Status: DC

## 2014-02-01 NOTE — Telephone Encounter (Signed)
Pt agrees to see another provider. Appt scheduled for this afternoon.

## 2014-02-01 NOTE — Progress Notes (Signed)
Subjective:    Patient ID: Autumn Davis, female    DOB: 1985/12/10, 28 y.o.   MRN: [id number]  HPI  This 28 y.o. female presents for evaluation of UR sx's.  Review of Systems No chest pain, SOB, HA, dizziness, vision change, N/V, diarrhea, constipation, dysuria, urinary urgency or frequency, myalgias, arthralgias or rash.     Objective:   Physical Exam  Vital signs noted  Well developed well nourished female.  HEENT - Head atraumatic Normocephalic                Eyes - PERRLA, Conjuctiva - clear Sclera- Clear EOMI                Ears - EAC's Wnl TM's Wnl Gross Hearing WNL                Nose - Nares patent                 Throat - oropharanx wnl Respiratory - Lungs CTA bilateral Cardiac - RRR S1 and S2 without murmur GI - Abdomen soft Nontender and bowel sounds active x 4 Extremities - No edema. Neuro - Grossly intact.      Assessment & Plan:  Candidiasis, mouth - Plan: nystatin (MYCOSTATIN) 100000 UNIT/ML suspension  URI (upper respiratory infection) - Plan: azithromycin (ZITHROMAX) 250 MG tablet  Deatra Canter FNP

## 2014-02-03 ENCOUNTER — Encounter: Payer: Self-pay | Admitting: Physical Medicine & Rehabilitation

## 2014-02-03 ENCOUNTER — Encounter: Payer: 59 | Attending: Physical Medicine & Rehabilitation | Admitting: Physical Medicine & Rehabilitation

## 2014-02-03 VITALS — BP 126/81 | HR 98 | Resp 14 | Ht 68.0 in | Wt 184.0 lb

## 2014-02-03 DIAGNOSIS — IMO0001 Reserved for inherently not codable concepts without codable children: Secondary | ICD-10-CM | POA: Insufficient documentation

## 2014-02-03 DIAGNOSIS — M791 Myalgia, unspecified site: Secondary | ICD-10-CM

## 2014-02-03 DIAGNOSIS — M797 Fibromyalgia: Secondary | ICD-10-CM

## 2014-02-03 DIAGNOSIS — M62838 Other muscle spasm: Secondary | ICD-10-CM

## 2014-02-03 DIAGNOSIS — M545 Low back pain, unspecified: Secondary | ICD-10-CM

## 2014-02-03 DIAGNOSIS — M546 Pain in thoracic spine: Secondary | ICD-10-CM | POA: Insufficient documentation

## 2014-02-03 DIAGNOSIS — R569 Unspecified convulsions: Secondary | ICD-10-CM | POA: Insufficient documentation

## 2014-02-03 DIAGNOSIS — K589 Irritable bowel syndrome without diarrhea: Secondary | ICD-10-CM | POA: Insufficient documentation

## 2014-02-03 MED ORDER — GABAPENTIN 300 MG PO CAPS: 300.0000 mg | ORAL_CAPSULE | Freq: Three times a day (TID) | ORAL | Status: DC

## 2014-02-03 MED ORDER — AMITRIPTYLINE HCL 25 MG PO TABS: 25.0000 mg | ORAL_TABLET | Freq: Every day | ORAL | Status: DC

## 2014-02-03 MED ORDER — OXYCODONE-ACETAMINOPHEN 5-325 MG PO TABS: 1.0000 | ORAL_TABLET | Freq: Four times a day (QID) | ORAL | Status: DC | PRN

## 2014-02-03 NOTE — Patient Instructions (Signed)
BEGIN GABAPENTIN 30OMG AT NIGHT FOR 5 DAYS THEN INCREASE TO TWICE DAILY FOR FIVE DAYS THEN INCREASE TO THREE X DAILY.   PLEASE CALL ME WITH ANY PROBLEMS OR QUESTIONS [phone]).

## 2014-02-03 NOTE — Progress Notes (Signed)
Subjective:    Patient ID: Autumn Davis, female    DOB: Jul 28, 1986, 28 y.o.   MRN: [id number]  HPI  Autumn Davis is back regarding her chronic pain and seizures. She is still in the middle of her work up between [facility] and [facility]. They are concerned about MS per pt, perhaps BG dysfunction. She has an MRI tonight and a spinal tap as well.   Her lamictal was stopped last week due to lack of efficacy.  She has noticed, however, that since coming off, she has had more seizures again particularly at night. She is interested in starting gabapentin which we discussed at last visit.  The baclofen has helped her spasticity. Her therapists have noticed a big difference in her ROM and mobility.  The meloxicam has helped a lot of her lower ext joint pain. She's not sure if the elavil has made a big difference.   Pain Inventory Average Pain 9 Pain Right Now 8 My pain is constant, sharp, burning, stabbing, tingling and aching  In the last 24 hours, has pain interfered with the following? General activity 8 Relation with others 8 Enjoyment of life 9 What TIME of day is your pain at its worst? evening, night Sleep (in general) Poor  Pain is worse with: walking, bending, standing and some activites Pain improves with: rest, heat/ice, therapy/exercise and medication Relief from Meds: 3  Mobility walk with assistance use a cane use a walker how many minutes can you walk? ? ability to climb steps?  no do you drive?  no use a wheelchair Do you have any goals in this area?  yes  Function disabled: date disabled na I need assistance with the following:  dressing, meal prep, household duties and shopping  Neuro/Psych bladder control problems weakness numbness tremor tingling trouble walking spasms dizziness  Prior Studies Any changes since last visit?  yes MRI scheduled 3/20  Physicians involved in your care Any changes since last visit?  yes Rheumatologist Dr. Orie Rout   Family  History  Problem Relation Age of Onset  . Thyroid disease Mother   . Cancer Maternal Grandmother     lung  . Cancer Paternal Grandmother     colon/pancreatiec/lymphoma   History   Social History  . Marital Status: Married    Spouse Name: N/A    Number of Children: N/A  . Years of Education: N/A   Social History Main Topics  . Smoking status: Never Smoker   . Smokeless tobacco: None  . Alcohol Use: No  . Drug Use: No  . Sexual Activity: None   Other Topics Concern  . None   Social History Narrative  . None   Past Surgical History  Procedure Laterality Date  . Knee surgery  2002/2003    bil  . Rectal surgery  correction of prolapse    2010   Past Medical History  Diagnosis Date  . GERD (gastroesophageal reflux disease)   . Hyperlipidemia   . Seizures   . Thyroid disease   . Bronchitis, chronic/intermittent 01/22/2012  . Depression 01/22/2012  . IBS (irritable bowel syndrome) 01/22/2012  . Seizures   . Neuromuscular disorder    BP 126/81  Pulse 98  Resp 14  Ht 5\' 8"  (1.727 m)  Wt 184 lb (83.462 kg)  BMI 27.98 kg/m2  SpO2 100%  Opioid Risk Score:   Fall Risk Score: Moderate Fall Risk (6-13 points) (pt educated and given brochure on fall risk previously)    Review  of Systems  Constitutional: Positive for diaphoresis.  Respiratory: Positive for cough and shortness of breath.   Gastrointestinal: Positive for nausea, vomiting, abdominal pain and constipation.  Genitourinary:       Bladder control problems   Musculoskeletal: Positive for back pain, gait problem and myalgias.  Neurological: Positive for dizziness, tremors, weakness and numbness.       Tingling  Hematological: Bruises/bleeds easily.  All other systems reviewed and are negative.       Objective:   Physical Exam General: Alert and oriented x 3, No apparent distress  HEENT: Head is normocephalic, atraumatic, PERRLA, EOMI, sclera anicteric, oral mucosa pink and moist, dentition intact, ext  ear canals clear,  Neck: Supple without JVD or lymphadenopathy  Heart: Reg rate and rhythm. No murmurs rubs or gallops  Chest: CTA bilaterally without wheezes, rales, or rhonchi; no distress  Abdomen: Soft, non-tender, non-distended, bowel sounds positive.  Extremities: No clubbing, cyanosis, or edema. Pulses are 2+  Skin: Clean and intact without signs of breakdown. She had a malar rash over the neck.  Neuro: Pt displays generally intact insight and awareness. CN exam appears intact without focal abnormalities today. She has good vocal quality, cough. Vision intact. Strength is grossly 4/5 in the upper limbs with improved ROM noted. Sensation is decreased below the waist bilaterally, distal more than proximal to PP/LT. She has decreased proprioception in both legs as well. She still has tight pecs bilaterally as well as hamstrings but they are better. She has  ight right lumbar paraspinal muscles as well. DTR's remain today in general at 1+ .  Resting tone at her hamstrings and pecs is grossly 1/4. She walks with a steppage gait pattern and circumducted gait.  Musculoskeletal: She has generalized joint pain but now swelling or gross abnormalities. She has a lot of tenderness in the areas of muscle and tendon tightness. She has pain along her right lower rib cage and obliques. She has a head forward posture and fair trunk positioning while sitting.  Psych: appears fatigued and anxious today  Assessment & Plan:   1. Chronic seizure disorder---? myoclonic jerks/myotonia/ or some other abnormal muscle movement as opposed to seizures? ?MS 2. Chronic muscle spasms, weakness with associated pain disorder.  3. Chronic dysphagia  4. Right low back/flank pain  5. I believe that her pain presentation is most consistent with fibromyalgia in the setting of whatever potential neuromuscular disorder is going on.  6. Anxiety with depression    Plan:  1. Increase elavil to 25mg  qhs. This also may help with her  urinary incontinence 2. Maintain baclofen to 20mg  for muscle spasms. Consider klonopin trial in the future.  3. Follow up with neurology and rheum regarding the management of her seizures and presentation. Marland Kitchen MRI and spinal tap pending.  4. Continue meloxicam 15mg  daily  5. Oxycodone 5mg  fr breakthrough pain  #120  6. Begin gabapentin trial for seizures and neuropathic pain relief. May help with mood stability also. 7. Continue with therapy. She is making progress.  Want her to focus on gait quality rather than speed, etc. 8. Follow up with my NP in about 4 weeks. 45 minutes of face to face patient care time were spent during this visit. All questions were encouraged and answered.

## 2014-02-13 ENCOUNTER — Telehealth: Payer: Self-pay | Admitting: Family Medicine

## 2014-02-13 ENCOUNTER — Ambulatory Visit (INDEPENDENT_AMBULATORY_CARE_PROVIDER_SITE_OTHER): Payer: 59 | Admitting: Family Medicine

## 2014-02-13 ENCOUNTER — Ambulatory Visit (INDEPENDENT_AMBULATORY_CARE_PROVIDER_SITE_OTHER): Payer: 59

## 2014-02-13 VITALS — BP 125/81 | HR 90 | Temp 98.2°F | Ht 68.0 in | Wt 186.2 lb

## 2014-02-13 DIAGNOSIS — R059 Cough, unspecified: Secondary | ICD-10-CM

## 2014-02-13 DIAGNOSIS — R0602 Shortness of breath: Secondary | ICD-10-CM

## 2014-02-13 DIAGNOSIS — R05 Cough: Secondary | ICD-10-CM

## 2014-02-13 LAB — POCT CBC
Granulocyte percent: 59.7 %G (ref 37–80)
HCT, POC: 41.4 % (ref 37.7–47.9)
Hemoglobin: 13.3 g/dL (ref 12.2–16.2)
Lymph, poc: 3.1 (ref 0.6–3.4)
MCH, POC: 28.6 pg (ref 27–31.2)
MCHC: 32.3 g/dL (ref 31.8–35.4)
MCV: 88.6 fL (ref 80–97)
MPV: 8.4 fL (ref 0–99.8)
POC Granulocyte: 5.6 (ref 2–6.9)
POC LYMPH PERCENT: 33.1 %L (ref 10–50)
Platelet Count, POC: 341 10*3/uL (ref 142–424)
RBC: 4.7 M/uL (ref 4.04–5.48)
RDW, POC: 13.2 %
WBC: 9.3 10*3/uL (ref 4.6–10.2)

## 2014-02-13 NOTE — Telephone Encounter (Signed)
appt today with bill 

## 2014-02-13 NOTE — Progress Notes (Signed)
Subjective:    Patient ID: Autumn Davis, female    DOB: 06-27-1986, 28 y.o.   MRN: [id number]  HPI This 28 y.o. female presents for evaluation of cough and congestion for the last few days. She has been having some difficulty taking a deep breath and she has been short of breath. She has been seeing neurology for neuromuscular condition and is supposed to be undergoing Muscle biopsy to get dx.  She has hx of sz disorder and sees physical medicine for chronic Pain, and FMS sx's.  She has been seeing RA for positive ANA and she states she has been Told to stay away from prednisone due to her RA condition.  She has muscle weakness and  Is able to ambulate but slowly.  She has problems with stopping her breathing at night according To her husband.  She has to sleep in recliner.  She has problems with dysphasia and she states she Has episodes where her throat spasms and she cannot breathe.  She has been rx'd an epi pen which she has used in the past and it helped her to get her airway open and she could breathe.     Review of Systems C/o weakness and SOB and cough   No chest pain, HA, dizziness, vision change, N/V, diarrhea, constipation, dysuria, urinary urgency or frequency, myalgias, arthralgias or rash.  Objective:   Physical Exam  Vital signs noted  Chronically ill appearing female.  HEENT - Head atraumatic Normocephalic                Eyes - PERRLA, Conjuctiva - clear Sclera- Clear EOMI                Ears - EAC's Wnl TM's Wnl Gross Hearing WNL                Throat - oropharanx wnl Respiratory - Distant breath sounds Cardiac - RRR S1 and S2 without murmur GI - Abdomen soft Nontender and bowel sounds active x 4 Extremities - weak in upper and lower extremities  Patient oxygen saturation on room air sitting is 98% and when she walks her sat remains 97% and Her heart rate increases to 126 and she is c/o SOB     CXR - No infiltrates Assessment & Plan:  SOB  (shortness of breath) - Plan: DG Chest 2 View, POCT CBC, POCT CBC, BMP8+EGFR, Ambulatory referral to Pulmonology  Cough - Plan: DG Chest 2 View, POCT CBC, POCT CBC, BMP8+EGFR, Ambulatory referral to Pulmonology symbicort 80/4.5 2 puffs bid  Deatra Canter FNP

## 2014-02-14 LAB — BMP8+EGFR
BUN/Creatinine Ratio: 16 (ref 8–20)
BUN: 10 mg/dL (ref 6–20)
CO2: 18 mmol/L (ref 18–29)
Calcium: 9.5 mg/dL (ref 8.7–10.2)
Chloride: 112 mmol/L — ABNORMAL HIGH (ref 97–108)
Creatinine, Ser: 0.62 mg/dL (ref 0.57–1.00)
GFR calc Af Amer: 140 mL/min/{1.73_m2} (ref >59)
GFR calc non Af Amer: 121 mL/min/{1.73_m2} (ref >59)
Glucose: 89 mg/dL (ref 65–99)
Potassium: 3.8 mmol/L (ref 3.5–5.2)
Sodium: 146 mmol/L — ABNORMAL HIGH (ref 134–144)

## 2014-02-28 ENCOUNTER — Encounter: Payer: 59 | Attending: Physical Medicine & Rehabilitation | Admitting: Registered Nurse

## 2014-02-28 ENCOUNTER — Encounter: Payer: Self-pay | Admitting: Registered Nurse

## 2014-02-28 VITALS — BP 134/70 | HR 80 | Resp 14 | Ht 68.0 in | Wt 190.4 lb

## 2014-02-28 DIAGNOSIS — K589 Irritable bowel syndrome without diarrhea: Secondary | ICD-10-CM | POA: Insufficient documentation

## 2014-02-28 DIAGNOSIS — M797 Fibromyalgia: Secondary | ICD-10-CM

## 2014-02-28 DIAGNOSIS — M545 Low back pain, unspecified: Secondary | ICD-10-CM | POA: Insufficient documentation

## 2014-02-28 DIAGNOSIS — M546 Pain in thoracic spine: Secondary | ICD-10-CM | POA: Insufficient documentation

## 2014-02-28 DIAGNOSIS — M791 Myalgia, unspecified site: Secondary | ICD-10-CM

## 2014-02-28 DIAGNOSIS — M62838 Other muscle spasm: Secondary | ICD-10-CM | POA: Insufficient documentation

## 2014-02-28 DIAGNOSIS — IMO0001 Reserved for inherently not codable concepts without codable children: Secondary | ICD-10-CM | POA: Insufficient documentation

## 2014-02-28 MED ORDER — OXYCODONE-ACETAMINOPHEN 5-325 MG PO TABS: 1.0000 | ORAL_TABLET | Freq: Four times a day (QID) | ORAL | Status: DC | PRN

## 2014-02-28 NOTE — Progress Notes (Signed)
Subjective:    Patient ID: Autumn Davis, female    DOB: 09/18/1986, 28 y.o.   MRN: [id number]  HPI: Autumn Davis is a 28 year old female who returns for follow up for chronic pain and medication refill. She says her pain is located mainly bilateral rib cage R>L.Also lately she's been having hip pain and admits to left leg tingling and numbness. She uses heat therapy in adjunction with medication and exercise therapy to help manage her pain. She says she's always in pain, the therapy helps it be manageable at times. She rates her pain 8. She's currently working with physical therapy, occupational therapy and occasionally she sees the speech therapist. Her therapy is twice a week. She performs her exercise regimen daily.   She wears her leg splints at night. When asked about her anxiety and depression she states the medication has helped and she feels it's stable.She says the seizures have improved with  gabapentin and their  Frequency has decreased.  Her PMD sending her to a Pulmonologist Dr. Marchelle Gearing at [facility] she's having frequent peiods of SOB and says she has had bronchitis for years,Her Neurologist Dr. Orie Rout from [facility] sent her to an opthamologist she was having blurred vision, double vision and dizziness. 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 She still having problems with her peripheral vision. She has glasses now and trying to get adjusted to wearing them. It has helped her vision greatly. She's schedule for spinal tap on Thursday March 02, 2014, her follow up appointment with her neurologist is May 11,2015. He will discuss her MRI and the spinal tap results with her.   Pain Inventory Average Pain 9 Pain Right Now 8 My pain is sharp, burning, dull, stabbing, tingling and aching  In the last 24 hours, has pain interfered with the following? General activity 9 Relation with others 9 Enjoyment of life 9 What TIME of day is your pain  at its worst? evening and night Sleep (in general) Poor  Pain is worse with: walking, bending, sitting, standing and some activites Pain improves with: rest, heat/ice, therapy/exercise and medication Relief from Meds: 3  Mobility walk with assistance use a cane use a walker ability to climb steps?  no do you drive?  no  Function disabled: date disabled . I need assistance with the following:  dressing, meal prep, household duties and shopping  Neuro/Psych bladder control problems weakness numbness tremor tingling trouble walking spasms dizziness confusion  Prior Studies Any changes since last visit?  no  Physicians involved in your care Any changes since last visit?  no   Family History  Problem Relation Age of Onset  . Thyroid disease Mother   . Cancer Maternal Grandmother     lung  . Cancer Paternal Grandmother     colon/pancreatiec/lymphoma   History   Social History  . Marital Status: Married    Spouse Name: N/A    Number of Children: N/A  . Years of Education: N/A   Social History Main Topics  . Smoking status: Never Smoker   . Smokeless tobacco: None  . Alcohol Use: No  . Drug Use: No  . Sexual Activity: None   Other Topics Concern  . None   Social History Narrative  . None   Past Surgical History  Procedure Laterality Date  . Knee surgery  2002/2003    bil  . Rectal surgery  correction of prolapse    2010  Past Medical History  Diagnosis Date  . GERD (gastroesophageal reflux disease)   . Hyperlipidemia   . Seizures   . Thyroid disease   . Bronchitis, chronic/intermittent 01/22/2012  . Depression 01/22/2012  . IBS (irritable bowel syndrome) 01/22/2012  . Seizures   . Neuromuscular disorder    BP 134/70  Pulse 80  Resp 14  Ht 5\' 8"  (1.727 m)  Wt 190 lb 6.4 oz (86.365 kg)  BMI 28.96 kg/m2  SpO2 100%  Opioid Risk Score:   Fall Risk Score: Moderate Fall Risk (6-13 points) (educated and handout given at previous visit) Review  of Systems  Constitutional: Positive for diaphoresis and unexpected weight change.  Respiratory: Positive for cough, shortness of breath and wheezing.   Gastrointestinal: Positive for nausea, vomiting and constipation.  Genitourinary:       Bladder control problems  Musculoskeletal: Positive for gait problem.       Spasms  Neurological: Positive for dizziness, tremors, weakness and numbness.       Tingling  Hematological: Bruises/bleeds easily.  Psychiatric/Behavioral: Positive for confusion.  All other systems reviewed and are negative.      Objective:   Physical Exam  Nursing note and vitals reviewed. Constitutional: She is oriented to person, place, and time. She appears well-developed and well-nourished.  HENT:  Head: Normocephalic.  Neck: Normal range of motion. Neck supple.  Cardiovascular: Normal rate, regular rhythm and normal heart sounds.   Pulmonary/Chest: Effort normal and breath sounds normal.  Musculoskeletal:  Normal Muscle Bulk: Muscle testing Reveals: Hand grips 5/5. Upper Extremities extended for short period spasms occurs.  Lower Extremities: Right leg flexion: Tightness Occurs ROM WNL Left Leg Flexion ROM WNL Wide Gait and  walks with a steppage  Gait pattern. Swing phase abnormality  Neurological: She is alert and oriented to person, place, and time.  Speech Clear and Concise  Skin: Skin is warm and dry.  Psychiatric: She has a normal mood and affect.          Assessment & Plan:  1. Chronic seizure disorder: Seizures have decreased in frequency. Continue Gabapentin. Neurology Following 2. Chronic muscle spasms, weakness with associated pain disorder: Continue Baclofen and MOBIC. Continue with Therapy and Exercise regime. 3. Chronic dysphagia: Continue with Speech Therapist/ Neurology Following 4. Anxiety with depression : Stable. Continue Elavil 5. Fibromyalgia: Refilled: oxyCODONE 5/325mg  one tablet every 6 hours as needed #120. Dr. Orie Rout trying  to locate a rheumatologist in his group. 6. Dyspnea: Pulmonologist referral was made, appointment 03/06/14.  45 minutes of face to face patient care time was spent during this visit. All questions were encouraged and answered.

## 2014-03-03 ENCOUNTER — Other Ambulatory Visit: Payer: Self-pay | Admitting: Family Medicine

## 2014-03-06 ENCOUNTER — Institutional Professional Consult (permissible substitution): Payer: 59 | Admitting: Internal Medicine

## 2014-03-13 ENCOUNTER — Ambulatory Visit (INDEPENDENT_AMBULATORY_CARE_PROVIDER_SITE_OTHER): Payer: 59 | Admitting: Nurse Practitioner

## 2014-03-13 ENCOUNTER — Encounter: Payer: Self-pay | Admitting: Nurse Practitioner

## 2014-03-13 ENCOUNTER — Ambulatory Visit: Payer: 59 | Admitting: Nurse Practitioner

## 2014-03-13 VITALS — BP 125/80 | HR 95 | Temp 98.7°F | Ht 68.0 in | Wt 187.4 lb

## 2014-03-13 DIAGNOSIS — K219 Gastro-esophageal reflux disease without esophagitis: Secondary | ICD-10-CM

## 2014-03-13 DIAGNOSIS — G8250 Quadriplegia, unspecified: Secondary | ICD-10-CM

## 2014-03-13 DIAGNOSIS — R1314 Dysphagia, pharyngoesophageal phase: Secondary | ICD-10-CM

## 2014-03-13 DIAGNOSIS — IMO0001 Reserved for inherently not codable concepts without codable children: Secondary | ICD-10-CM

## 2014-03-13 DIAGNOSIS — M797 Fibromyalgia: Secondary | ICD-10-CM

## 2014-03-13 DIAGNOSIS — E785 Hyperlipidemia, unspecified: Secondary | ICD-10-CM

## 2014-03-13 DIAGNOSIS — E039 Hypothyroidism, unspecified: Secondary | ICD-10-CM

## 2014-03-13 NOTE — Patient Instructions (Signed)

## 2014-03-13 NOTE — Progress Notes (Addendum)
Subjective:    Patient ID: Autumn Davis, female    DOB: 01-06-1986, 28 y.o.   MRN: [id number]  [facility]. Has hx of quadraplegia but they are not sure of the cause . Has seen multiple specialist with no real answers- SHe is getting around well with a cane right now- has been in wheel chair in the past unable to walk. Saw Dr.PINYAN and he is currently running some test on her.  * C/o Dysphagia which is getting worse- can't hardly swallow her pills or foods Patient Active Problem List   Diagnosis Date Noted  . Fibromyalgia 12/14/2013  . Muscle spasticity 11/14/2013  . Thoracic back pain 11/14/2013  . Lumbago 11/14/2013  . Quadriplegia 01/10/2013  . Convulsions 08/26/2012  . Hyperlipidemia 08/26/2012  . IBS (irritable bowel syndrome) 01/22/2012  . Depression 01/22/2012  . Bronchitis, chronic/intermittent 01/22/2012  . Abdominal pain, generalized 01/22/2012  . Back pain, lumbosacral 01/22/2012  . GERD (gastroesophageal reflux disease)   . Seizures   . Thyroid disease    Outpatient Encounter Prescriptions as of 03/13/2014  Medication Sig  . acetaminophen (TYLENOL) 500 MG tablet Take 500 mg by mouth every 6 (six) hours as needed.  Marland Kitchen acetaZOLAMIDE (DIAMOX) 250 MG tablet Take 250 mg by mouth 3 (three) times daily.   Marland Kitchen albuterol (PROVENTIL HFA;VENTOLIN HFA) 108 (90 BASE) MCG/ACT inhaler Inhale 2 puffs into the lungs every 6 (six) hours as needed for wheezing or shortness of breath.  Marland Kitchen amitriptyline (ELAVIL) 25 MG tablet Take 1 tablet (25 mg total) by mouth at bedtime.  Marland Kitchen atorvastatin (LIPITOR) 40 MG tablet Take 1 tablet (40 mg total) by mouth daily.  . baclofen (LIORESAL) 20 MG tablet Take 1 tablet (20 mg total) by mouth 4 (four) times daily.  . Budesonide-Formoterol Fumarate (SYMBICORT IN) Inhale into the lungs.  . cetirizine (ZYRTEC) 10 MG tablet Take 10 mg by mouth daily.  . cycloSPORINE (RESTASIS) 0.05 % ophthalmic emulsion  Place 1 drop into both eyes 2 (two) times daily.  . diphenhydrAMINE (BENADRYL) 25 MG tablet Take 25 mg by mouth every 6 (six) hours as needed. PRN  . docusate sodium (COLACE) 100 MG capsule Take 100 mg by mouth 2 (two) times daily. PRN  . EPINEPHrine (EPI-PEN) 0.3 mg/0.3 mL SOAJ injection Inject 0.3 mg into the muscle as needed.  . gabapentin (NEURONTIN) 300 MG capsule Take 1 capsule (300 mg total) by mouth 3 (three) times daily.  Marland Kitchen ipratropium-albuterol (DUONEB) 0.5-2.5 (3) MG/3ML SOLN Take 3 mLs by nebulization every 4 (four) hours as needed.  . medroxyPROGESTERone (DEPO-PROVERA) 150 MG/ML injection Inject 150 mg into the muscle as directed. Every 2 months  . meloxicam (MOBIC) 15 MG tablet Take 1 tablet (15 mg total) by mouth daily.  . montelukast (SINGULAIR) 10 MG tablet TAKE ONE TABLET DAILY AT BEDTIME  . nystatin (MYCOSTATIN) 100000 UNIT/ML suspension Take 5 mLs (500,000 Units total) by mouth 4 (four) times daily.  . Omega 3 1000 MG CAPS Take 1 capsule by mouth daily.  Marland Kitchen omeprazole (PRILOSEC OTC) 20 MG tablet Take 20 mg by mouth daily.  Marland Kitchen oxyCODONE-acetaminophen (PERCOCET) 5-325 MG per tablet Take 1 tablet by mouth every 6 (six) hours as needed for severe pain.  . promethazine (PHENERGAN) 12.5 MG tablet Take 1 tablet (12.5 mg total) by mouth every 8 (eight) hours as needed for nausea or vomiting.  . vitamin E (VITAMIN E) 400 UNIT capsule Take 400  Units by mouth daily.       Review of Systems  Constitutional: Positive for fatigue.  HENT: Negative.   Respiratory: Negative.   Cardiovascular: Negative.   Genitourinary: Negative.   Neurological: Positive for weakness.  Psychiatric/Behavioral: Negative.   All other systems reviewed and are negative.      Objective:   Physical Exam  Constitutional: She is oriented to person, place, and time. She appears well-developed and well-nourished.  HENT:  Right Ear: External ear normal.  Left Ear: External ear normal.  Nose: Nose normal.    Mouth/Throat: Oropharynx is clear and moist.  Eyes: Conjunctivae are normal. Pupils are equal, round, and reactive to light.  Neck: Normal range of motion. Neck supple.  Cardiovascular: Normal rate, regular rhythm and normal heart sounds.   Pulmonary/Chest: Effort normal and breath sounds normal.  Musculoskeletal:  Walking with Gilmer Mor today- gait improving  Neurological: She is alert and oriented to person, place, and time.  Skin: Skin is warm and dry.  Psychiatric: She has a normal mood and affect. Her behavior is normal. Judgment and thought content normal.    BP 125/80  Pulse 95  Temp(Src) 98.7 F (37.1 C) (Oral)  Ht 5\' 8"  (1.727 m)  Wt 187 lb 6.4 oz (85.004 kg)  BMI 28.50 kg/m2       Assessment & Plan:   1. Hyperlipidemia   2. GERD (gastroesophageal reflux disease)   3. Fibromyalgia   4. Quadriplegia   5. Dysphagia, pharyngoesophageal phase   6. Hypothyroidism    Orders Placed This Encounter  Procedures  . CMP14+EGFR  . NMR, lipoprofile  . Thyroid Panel With TSH  . Ambulatory referral to Gastroenterology    Referral Priority:  Routine    Referral Type:  Consultation    Referral Reason:  Specialty Services Required    Requested Specialty:  Gastroenterology    Number of Visits Requested:  1   Health maintenance reviewed Keep follow up with all specialists RTO prn  Mary-Margaret Daphine Deutscher, FNP

## 2014-03-14 ENCOUNTER — Encounter: Payer: Self-pay | Admitting: Gastroenterology

## 2014-03-14 LAB — NMR, LIPOPROFILE
Cholesterol: 171 mg/dL (ref <200)
HDL Cholesterol by NMR: 50 mg/dL (ref >=40)
HDL Particle Number: 33 umol/L (ref >=30.5)
LDL Particle Number: 1442 nmol/L — ABNORMAL HIGH (ref <1000)
LDL Size: 20.8 nm (ref >20.5)
LDLC SERPL CALC-MCNC: 99 mg/dL (ref <100)
LP-IR Score: 42 (ref <=45)
Small LDL Particle Number: 817 nmol/L — ABNORMAL HIGH (ref <=527)
Triglycerides by NMR: 112 mg/dL (ref <150)

## 2014-03-14 LAB — CMP14+EGFR
ALT: 25 IU/L (ref 0–32)
AST: 20 IU/L (ref 0–40)
Albumin/Globulin Ratio: 2.2 (ref 1.1–2.5)
Albumin: 4.6 g/dL (ref 3.5–5.5)
Alkaline Phosphatase: 109 IU/L (ref 39–117)
BUN / CREAT RATIO: 13 (ref 8–20)
BUN: 8 mg/dL (ref 6–20)
CALCIUM: 9.6 mg/dL (ref 8.7–10.2)
CHLORIDE: 111 mmol/L — AB (ref 97–108)
CO2: 18 mmol/L (ref 18–29)
Creatinine, Ser: 0.62 mg/dL (ref 0.57–1.00)
GFR calc Af Amer: 140 mL/min/{1.73_m2} (ref >59)
GFR calc non Af Amer: 121 mL/min/{1.73_m2} (ref >59)
Globulin, Total: 2.1 g/dL (ref 1.5–4.5)
Glucose: 98 mg/dL (ref 65–99)
POTASSIUM: 3.6 mmol/L (ref 3.5–5.2)
SODIUM: 145 mmol/L — AB (ref 134–144)
Total Bilirubin: 0.4 mg/dL (ref 0.0–1.2)
Total Protein: 6.7 g/dL (ref 6.0–8.5)

## 2014-03-14 LAB — THYROID PANEL WITH TSH
FREE THYROXINE INDEX: 2.1 (ref 1.2–4.9)
T3 Uptake Ratio: 26 % (ref 24–39)
T4 TOTAL: 8.1 ug/dL (ref 4.5–12.0)
TSH: 2.64 u[IU]/mL (ref 0.450–4.500)

## 2014-03-20 ENCOUNTER — Other Ambulatory Visit: Payer: Self-pay | Admitting: Nurse Practitioner

## 2014-03-31 ENCOUNTER — Encounter: Payer: Self-pay | Admitting: Registered Nurse

## 2014-03-31 ENCOUNTER — Encounter: Payer: 59 | Attending: Physical Medicine & Rehabilitation | Admitting: Registered Nurse

## 2014-03-31 VITALS — BP 149/76 | HR 94 | Resp 14 | Ht 68.0 in | Wt 191.0 lb

## 2014-03-31 DIAGNOSIS — M545 Low back pain, unspecified: Secondary | ICD-10-CM | POA: Insufficient documentation

## 2014-03-31 DIAGNOSIS — IMO0001 Reserved for inherently not codable concepts without codable children: Secondary | ICD-10-CM

## 2014-03-31 DIAGNOSIS — Z79899 Other long term (current) drug therapy: Secondary | ICD-10-CM

## 2014-03-31 DIAGNOSIS — M791 Myalgia, unspecified site: Secondary | ICD-10-CM

## 2014-03-31 DIAGNOSIS — R569 Unspecified convulsions: Secondary | ICD-10-CM | POA: Insufficient documentation

## 2014-03-31 DIAGNOSIS — Z5181 Encounter for therapeutic drug level monitoring: Secondary | ICD-10-CM

## 2014-03-31 DIAGNOSIS — M546 Pain in thoracic spine: Secondary | ICD-10-CM | POA: Insufficient documentation

## 2014-03-31 DIAGNOSIS — M62838 Other muscle spasm: Secondary | ICD-10-CM

## 2014-03-31 DIAGNOSIS — K589 Irritable bowel syndrome without diarrhea: Secondary | ICD-10-CM | POA: Insufficient documentation

## 2014-03-31 DIAGNOSIS — M797 Fibromyalgia: Secondary | ICD-10-CM

## 2014-03-31 MED ORDER — MELOXICAM 15 MG PO TABS: 15.0000 mg | ORAL_TABLET | Freq: Every day | ORAL | Status: DC

## 2014-03-31 MED ORDER — OXYCODONE-ACETAMINOPHEN 5-325 MG PO TABS: 1.0000 | ORAL_TABLET | Freq: Four times a day (QID) | ORAL | Status: DC | PRN

## 2014-03-31 MED ORDER — BACLOFEN 20 MG PO TABS: 20.0000 mg | ORAL_TABLET | Freq: Four times a day (QID) | ORAL | Status: DC

## 2014-03-31 MED ORDER — GABAPENTIN 300 MG PO CAPS: 300.0000 mg | ORAL_CAPSULE | Freq: Four times a day (QID) | ORAL | Status: DC

## 2014-03-31 NOTE — Progress Notes (Signed)
Subjective:    Patient ID: Autumn Davis, female    DOB: [birth year], 28 y.o.   MRN: [id number]  HPI: Autumn Davis is a 28 year old female who returns for follow up for chronic pain and medication refill. She says her pain is located in bilateral arms, rib cage, thoracic, lower extremities with tingling in her hands and feet. She rates her pain 8. Her current exercise regime is walking and performing stretching exercises. She will be doing aquatic exercises soon in her grandmother's pool. She continues to use heat therapy.  She continues with dysphasia and has an appointment with gastroenterologist at [facility] on June 9th, 2015.  She went to [facility] on [place] 03/27/2014, they spoke to her regarding her spinal tap and blood work and told her they will continue to watch and follow up.They let her know to have her PMD find a Rheumatologist.  Pain Inventory Average Pain 9 Pain Right Now 8 My pain is constant, sharp, burning, dull, stabbing, tingling and aching  In the last 24 hours, has pain interfered with the following? General activity 9 Relation with others 8 Enjoyment of life 9 What TIME of day is your pain at its worst? evening, night Sleep (in general) Poor  Pain is worse with: walking, bending, sitting, standing and some activites Pain improves with: rest and heat/ice Relief from Meds: 3  Mobility walk with assistance use a cane use a walker ability to climb steps?  no do you drive?  no use a wheelchair transfers alone Do you have any goals in this area?  yes  Function disabled: date disabled na  Neuro/Psych No problems in this area  Prior Studies Any changes since last visit?  yes CT/MRI spinal tap  Physicians involved in your care Any changes since last visit?  no   Family History  Problem Relation Age of Onset  . Thyroid disease Mother   . Cancer Maternal Grandmother     lung  . Cancer Paternal Grandmother    colon/pancreatiec/lymphoma   History   Social History  . Marital Status: Married    Spouse Name: N/A    Number of Children: N/A  . Years of Education: N/A   Social History Main Topics  . Smoking status: Never Smoker   . Smokeless tobacco: None  . Alcohol Use: No  . Drug Use: No  . Sexual Activity: None   Other Topics Concern  . None   Social History Narrative  . None   Past Surgical History  Procedure Laterality Date  . Knee surgery  2002/2003    bil  . Rectal surgery  correction of prolapse    2010   Past Medical History  Diagnosis Date  . GERD (gastroesophageal reflux disease)   . Hyperlipidemia   . Seizures   . Thyroid disease   . Bronchitis, chronic/intermittent 01/22/2012  . Depression 01/22/2012  . IBS (irritable bowel syndrome) 01/22/2012  . Seizures   . Neuromuscular disorder    BP 149/76  Pulse 94  Resp 14  Ht 5\' 8"  (1.727 m)  Wt 191 lb (86.637 kg)  BMI 29.05 kg/m2  SpO2 100%  Opioid Risk Score:   Fall Risk Score: Moderate Fall Risk (6-13 points) (pt educated on fall risk, brochure given to pt previously)   Review of Systems     Objective:   Physical Exam  Nursing note and vitals reviewed. Constitutional: She is oriented to person, place, and time. She appears well-developed and  well-nourished.  HENT:  Head: Normocephalic and atraumatic.  Neck: Normal range of motion. Neck supple.  Cardiovascular: Normal rate, regular rhythm and normal heart sounds.   Pulmonary/Chest: Effort normal and breath sounds normal.  Musculoskeletal:  Normal Muscle Bulk: Muscle Testing reveals: Upper Extremities: Decreased ROM 45 degrees and Muscle Strength 4/4. Spasms noted with extension. Lateral Tenderness noted at Rib cage Back without spinal or paraspinal tenderness noted. Lower extremities full ROM. Walks with a straight cane. Wide based Gait with steppage gait pattern.  Neurological: She is alert and oriented to person, place, and time.  Skin: Skin is warm  and dry.  Psychiatric: She has a normal mood and affect.          Assessment & Plan:  1. Chronic seizure disorder: No seizure's. Continue Gabapentin. Neurology Following  2. Chronic muscle spasms, weakness with associated pain disorder: Continue Baclofen and MOBIC. Continue with  Exercise regime. PT and OT on hold at this time. 3. Chronic dysphagia: Has an appointment with [facility] on June 9th, 2015 4. Anxiety with depression : Stable. Continue Elavil  5. Fibromyalgia: Refilled: oxyCODONE 5/325mg  one tablet every 6 hours as needed #120. Her PMD trying to find her a rheumatologist.  30 minutes of face to face patient care time was spent during this visit. All questions were encouraged and answered.

## 2014-04-04 ENCOUNTER — Ambulatory Visit (INDEPENDENT_AMBULATORY_CARE_PROVIDER_SITE_OTHER): Payer: 59 | Admitting: Internal Medicine

## 2014-04-04 ENCOUNTER — Other Ambulatory Visit: Payer: 59

## 2014-04-04 ENCOUNTER — Encounter: Payer: Self-pay | Admitting: Internal Medicine

## 2014-04-04 VITALS — BP 122/80 | HR 96 | Ht 68.0 in | Wt 191.2 lb

## 2014-04-04 DIAGNOSIS — R0609 Other forms of dyspnea: Secondary | ICD-10-CM

## 2014-04-04 DIAGNOSIS — R0989 Other specified symptoms and signs involving the circulatory and respiratory systems: Secondary | ICD-10-CM

## 2014-04-04 DIAGNOSIS — R0600 Dyspnea, unspecified: Secondary | ICD-10-CM

## 2014-04-04 NOTE — Progress Notes (Signed)
Subjective:    Patient ID: Autumn Davis, female    DOB: 1986-09-20, 28 y.o.   MRN: [id number]  PCP Rudi Heap, MD   HPI  Chief Complaint  Patient presents with  . Pulmonary Consult    Referred by Dr. Purcell Nails for SOB and cough x 3 years.    28 year old female accompanied by her father. Reports dyspnea and cough for the last few years. Her background history is very complex and enumerated below. Main issue is dyspnea. Insidious onset. Ongoing for a few years. Progressive. Moderate in intensity. Notices dyspnea random episodic associated with throat choking and tightness. Happens or 24 hours but particularly more so late in the evening and early morning. There is associated acid reflux for which she is on proton pump inhibitors but says these medicines make her feel worse. There is also some dyspnea with exertion and symptoms are associated with some muscle pull symptoms in bilateral infra-axillary areas. Also, epi shots help relive symptoms. 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 ANA July 2040 and was 1:80 but she says after this it rose to 1:300. She was seen by a rheumatologist in [place] but apparently she's been placed on expectant followup. She is not happy about it. In addition she is seeing a neurologist both at [facility] and [place]. 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 She uses a cane to walk but a few years ago was in a wheelchair. Multiple sclerosis is suspected but apparently has never been confirmed. The diagnoses of all this is unclear. Lyme antibody titer 2015 was normal. She has had MRI brain and cervical spine MRI in February 2014 and this was normal. Symptoms intermittent and fluctuant     There is a mild dry cough associated with this. RSI cough score       Dyspnea relevant hx  CXR March 2-15 - clear Hgb 13.3gm% -  march 2015 Body mass index is 29.08 kg/(m^2). ANA - positive, 1:80, CCP 2, RF < 10 - July 2014 Walking desaturation test in office: 185 feet x 3 laps: lowest pulse ox 91% Echocardiogram: Reportedly has had some wet at some unknown time but she does not know when and where. I can't find this information      Past Medical History  Diagnosis Date  . GERD (gastroesophageal reflux disease)   . Hyperlipidemia   . Seizures   . Thyroid disease   . Bronchitis, chronic/intermittent 01/22/2012  . Depression 01/22/2012  . IBS (irritable bowel syndrome) 01/22/2012  . Neuromuscular disorder      Family History  Problem Relation Age of Onset  . Thyroid disease Mother   . Cancer Maternal Grandmother     lung  . Cancer Paternal Grandmother     colon/pancreatiec/lymphoma     History   Social History  . Marital Status: Married    Spouse Name: N/A    Number of Children: N/A  . Years of Education: N/A   Occupational History  . disabled    Social History Main Topics  . Smoking status: Never Smoker   . Smokeless tobacco: Never Used  . Alcohol Use: No  . Drug Use: No  . Sexual Activity: Not on file   Other Topics Concern  . Not on file   Social History Narrative  . No narrative on file     Allergies  Allergen Reactions  .  Compazine Other (See Comments)    hallucinations  . Prochlorperazine Other (See Comments)    Pt states it makes her feel like things are crawling on her  . Tomato Rash     Outpatient Prescriptions Prior to Visit  Medication Sig Dispense Refill  . acetaminophen (TYLENOL) 500 MG tablet Take 500 mg by mouth every 6 (six) hours as needed.      Marland Kitchen acetaZOLAMIDE (DIAMOX) 250 MG tablet Take 250 mg by mouth 3 (three) times daily.       Marland Kitchen albuterol (PROVENTIL HFA;VENTOLIN HFA) 108 (90 BASE) MCG/ACT inhaler Inhale 2 puffs into the lungs every 6 (six) hours as needed for wheezing or shortness of breath.      Marland Kitchen amitriptyline (ELAVIL) 25 MG tablet Take 1 tablet (25 mg  total) by mouth at bedtime.  30 tablet  4  . atorvastatin (LIPITOR) 40 MG tablet TAKE ONE (1) TABLET EACH DAY  90 tablet  0  . baclofen (LIORESAL) 20 MG tablet Take 1 tablet (20 mg total) by mouth 4 (four) times daily.  120 each  4  . Budesonide-Formoterol Fumarate (SYMBICORT IN) Inhale 1 puff into the lungs 2 (two) times daily. 80 mcg      . cetirizine (ZYRTEC) 10 MG tablet Take 10 mg by mouth daily.      . cycloSPORINE (RESTASIS) 0.05 % ophthalmic emulsion Place 1 drop into both eyes 2 (two) times daily.      . diphenhydrAMINE (BENADRYL) 25 MG tablet Take 25 mg by mouth every 6 (six) hours as needed. PRN      . docusate sodium (COLACE) 100 MG capsule Take 100 mg by mouth 2 (two) times daily. PRN      . EPINEPHrine (EPI-PEN) 0.3 mg/0.3 mL SOAJ injection Inject 0.3 mg into the muscle as needed.      . gabapentin (NEURONTIN) 300 MG capsule Take 1 capsule (300 mg total) by mouth 4 (four) times daily.  120 capsule  3  . ipratropium-albuterol (DUONEB) 0.5-2.5 (3) MG/3ML SOLN Take 3 mLs by nebulization every 4 (four) hours as needed.  360 mL  4  . medroxyPROGESTERone (DEPO-PROVERA) 150 MG/ML injection Inject 150 mg into the muscle as directed. Every 2 months      . meloxicam (MOBIC) 15 MG tablet Take 1 tablet (15 mg total) by mouth daily.  30 tablet  5  . montelukast (SINGULAIR) 10 MG tablet TAKE ONE TABLET DAILY AT BEDTIME  30 tablet  2  . Omega 3 1000 MG CAPS Take 1 capsule by mouth daily.      Marland Kitchen omeprazole (PRILOSEC OTC) 20 MG tablet Take 20 mg by mouth daily.      Marland Kitchen oxyCODONE-acetaminophen (PERCOCET) 5-325 MG per tablet Take 1 tablet by mouth every 6 (six) hours as needed for severe pain.  120 tablet  0  . promethazine (PHENERGAN) 12.5 MG tablet Take 1 tablet (12.5 mg total) by mouth every 8 (eight) hours as needed for nausea or vomiting.  30 tablet  0  . vitamin E (VITAMIN E) 400 UNIT capsule Take 400 Units by mouth daily.      Marland Kitchen nystatin (MYCOSTATIN) 100000 UNIT/ML suspension Take 5 mLs (500,000  Units total) by mouth 4 (four) times daily.  240 mL  1   No facility-administered medications prior to visit.       Review of Systems  Constitutional: Negative for fever and unexpected weight change.  HENT: Positive for congestion, nosebleeds and trouble swallowing. Negative for dental problem, ear  pain, postnasal drip, rhinorrhea, sinus pressure, sneezing and sore throat.   Eyes: Negative for redness and itching.  Respiratory: Positive for cough, chest tightness and shortness of breath. Negative for wheezing.   Cardiovascular: Negative for palpitations and leg swelling.  Gastrointestinal: Positive for nausea and vomiting.  Genitourinary: Negative for dysuria.  Musculoskeletal: Negative for joint swelling.  Skin: Negative for rash.  Neurological: Negative for headaches.  Hematological: Does not bruise/bleed easily.  Psychiatric/Behavioral: Negative for dysphoric mood. The patient is not nervous/anxious.        Objective:   Physical Exam  Vitals reviewed. Constitutional: She is oriented to person, place, and time. She appears well-developed and well-nourished. No distress.  HENT:  Head: Normocephalic and atraumatic.  Right Ear: External ear normal.  Left Ear: External ear normal.  Mouth/Throat: Oropharynx is clear and moist. No oropharyngeal exudate.  Eyes: Conjunctivae and EOM are normal. Pupils are equal, round, and reactive to light. Right eye exhibits no discharge. Left eye exhibits no discharge. No scleral icterus.  Neck: Normal range of motion. Neck supple. No JVD present. No tracheal deviation present. No thyromegaly present.  Cardiovascular: Normal rate, regular rhythm, normal heart sounds and intact distal pulses.  Exam reveals no gallop and no friction rub.   No murmur heard. Pulmonary/Chest: Effort normal and breath sounds normal. No respiratory distress. She has no wheezes. She has no rales. She exhibits no tenderness.  Abdominal: Soft. Bowel sounds are normal. She  exhibits no distension and no mass. There is no tenderness. There is no rebound and no guarding.  Musculoskeletal: Normal range of motion. She exhibits no edema and no tenderness.  Lymphadenopathy:    She has no cervical adenopathy.  Neurological: She is alert and oriented to person, place, and time. She has normal reflexes. No cranial nerve deficit. She exhibits normal muscle tone. Coordination normal.  Uses cane  Skin: Skin is warm and dry. No rash noted. She is not diaphoretic. No erythema. No pallor.  Psychiatric: She has a normal mood and affect. Her behavior is normal. Judgment and thought content normal.          Assessment & Plan:   #Shortness of breath - stop symbicort, do nebs as needed - do d-dimer blood test for blood clot evaluation  - do full PFT breathing test asap; call me once done so I Can advise next step  #Followup - depends on outcome of above tests; please call me to get results of breathing test

## 2014-04-04 NOTE — Patient Instructions (Addendum)
#  Cough  - do cough score sheet part 1 now (update: will have to have her redo at followup)   #Shortness of breath - stop symbicort, do nebs as needed - do d-dimer blood test for blood clot evaluation  - do full PFT breathing test asap; call me once done so I Can advise next step  #Followup - depends on outcome of above tests; please call me to get results of breathing test

## 2014-04-05 LAB — D-DIMER, QUANTITATIVE: D-Dimer, Quant: 0.27 ug/mL-FEU (ref 0.00–0.48)

## 2014-04-06 ENCOUNTER — Telehealth: Payer: Self-pay | Admitting: Internal Medicine

## 2014-04-06 NOTE — Progress Notes (Signed)
Quick Note:  Spoke with pt and notified of results per Dr.Ramaswamy. Pt verbalized understanding and denied any questions.  ______ 

## 2014-04-06 NOTE — Telephone Encounter (Signed)
               I spoke with the pt and notified that labs were normal  Nothing further needed 

## 2014-04-06 NOTE — Progress Notes (Signed)
Quick Note:  lmtcb ______ 

## 2014-04-10 DIAGNOSIS — R0600 Dyspnea, unspecified: Secondary | ICD-10-CM | POA: Insufficient documentation

## 2014-04-10 NOTE — Assessment & Plan Note (Signed)
?   Neuromuscular, /? Deconditioning  PLAN  #Shortness of breath - stop symbicort, do nebs as needed - do d-dimer blood test for blood clot evaluation  - do full PFT breathing test asap; call me once done so I Can advise next step  #Followup - depends on outcome of above tests; please call me to get results of breathing test

## 2014-04-25 ENCOUNTER — Ambulatory Visit: Payer: 59 | Admitting: Gastroenterology

## 2014-04-26 ENCOUNTER — Encounter: Payer: Self-pay | Admitting: Registered Nurse

## 2014-04-26 ENCOUNTER — Telehealth: Payer: Self-pay

## 2014-04-26 ENCOUNTER — Encounter: Payer: 59 | Attending: Physical Medicine & Rehabilitation | Admitting: Registered Nurse

## 2014-04-26 VITALS — BP 119/74 | HR 81 | Resp 14 | Ht 68.0 in | Wt 191.0 lb

## 2014-04-26 DIAGNOSIS — Z79899 Other long term (current) drug therapy: Secondary | ICD-10-CM

## 2014-04-26 DIAGNOSIS — Z5181 Encounter for therapeutic drug level monitoring: Secondary | ICD-10-CM

## 2014-04-26 DIAGNOSIS — R1310 Dysphagia, unspecified: Secondary | ICD-10-CM

## 2014-04-26 DIAGNOSIS — IMO0001 Reserved for inherently not codable concepts without codable children: Secondary | ICD-10-CM

## 2014-04-26 DIAGNOSIS — M62838 Other muscle spasm: Secondary | ICD-10-CM

## 2014-04-26 DIAGNOSIS — R569 Unspecified convulsions: Secondary | ICD-10-CM

## 2014-04-26 DIAGNOSIS — M545 Low back pain, unspecified: Secondary | ICD-10-CM

## 2014-04-26 DIAGNOSIS — M791 Myalgia, unspecified site: Secondary | ICD-10-CM

## 2014-04-26 DIAGNOSIS — M546 Pain in thoracic spine: Secondary | ICD-10-CM

## 2014-04-26 DIAGNOSIS — K589 Irritable bowel syndrome without diarrhea: Secondary | ICD-10-CM

## 2014-04-26 DIAGNOSIS — M797 Fibromyalgia: Secondary | ICD-10-CM

## 2014-04-26 MED ORDER — OXYCODONE-ACETAMINOPHEN 5-325 MG PO TABS: 1.0000 | ORAL_TABLET | Freq: Four times a day (QID) | ORAL | Status: DC | PRN

## 2014-04-26 NOTE — Progress Notes (Signed)
Subjective:    Patient ID: Autumn Davis, female    DOB: [birth year], 28 y.o.   MRN: [id number]  HPI: [name] is a 28 year old female who returns for follow up for chronic pain and medication refill. She says her pain is in her rib cage right greater than left. She rates her pain 8. Her current exercise regime is performing stretching exercises.  She is in the process of trying to find a new Rhematologist. Her neurologist/ Neuropsychologist ordered a new medication the cost was $3000, for unknown autimmnune disorders. They are seeing if the can get approval from the insurance or drug company. She would like to go back to physical, occupational and speech therapy. Order has een placed. Two weeks ago she had a relapse her legs were extremely weak, she says she is starting to feel better she is using the walker today (cadillac). She wears her leg braces at night. Husband in the room all questions answered.  Pain Inventory Average Pain 9 Pain Right Now 8 My pain is n/a  In the last 24 hours, has pain interfered with the following? General activity 9 Relation with others 7 Enjoyment of life 8 What TIME of day is your pain at its worst? evening, night Sleep (in general) Poor  Pain is worse with: walking, bending, sitting, standing and some activites Pain improves with: rest, heat/ice, therapy/exercise and medication Relief from Meds: 3  Mobility walk with assistance use a cane use a walker ability to climb steps?  no do you drive?  no use a wheelchair transfers alone  Function disabled: date disabled . I need assistance with the following:  dressing, meal prep, household duties and shopping  Neuro/Psych bladder control problems weakness numbness tremor tingling trouble walking spasms dizziness  Prior Studies Any changes since last visit?  no  Physicians involved in your care pulmonologist   Family History  Problem Relation Age of Onset  .  Thyroid disease Mother   . Cancer Maternal Grandmother     lung  . Cancer Paternal Grandmother     colon/pancreatiec/lymphoma   History   Social History  . Marital Status: Married    Spouse Name: N/A    Number of Children: N/A  . Years of Education: N/A   Occupational History  . disabled    Social History Main Topics  . Smoking status: Never Smoker   . Smokeless tobacco: Never Used  . Alcohol Use: No  . Drug Use: No  . Sexual Activity: None   Other Topics Concern  . None   Social History Narrative  . None   Past Surgical History  Procedure Laterality Date  . Knee surgery  2002/2003    bil  . Rectal surgery  correction of prolapse    2010   Past Medical History  Diagnosis Date  . GERD (gastroesophageal reflux disease)   . Hyperlipidemia   . Seizures   . Thyroid disease   . Bronchitis, chronic/intermittent 01/22/2012  . Depression 01/22/2012  . IBS (irritable bowel syndrome) 01/22/2012  . Neuromuscular disorder   . SOB (shortness of breath)    BP 119/74  Pulse 81  Resp 14  Ht 5\' 8"  (1.727 m)  Wt 191 lb (86.637 kg)  BMI 29.05 kg/m2  SpO2 99%  Opioid Risk Score:   Fall Risk Score: High Fall Risk (>13 points) (patient educated handout declined)   Review of Systems  Constitutional: Positive for fever, chills and diaphoresis.  Respiratory: Positive for cough, shortness of breath and wheezing.   Gastrointestinal: Positive for nausea and constipation.  Genitourinary: Positive for difficulty urinating.  Musculoskeletal: Positive for back pain and gait problem.  Neurological: Positive for dizziness, tremors, weakness and numbness.       Tingling, spasm  All other systems reviewed and are negative.      Objective:   Physical Exam  Nursing note and vitals reviewed. Constitutional: She is oriented to person, place, and time. She appears well-developed and well-nourished.  HENT:  Head: Normocephalic and atraumatic.  Neck: Normal range of motion. Neck supple.    Cardiovascular: Normal rate, regular rhythm and normal heart sounds.   Pulmonary/Chest: Effort normal and breath sounds normal.  Musculoskeletal:  Normal Muscle Bulk and Muscle Testing Reveals: Upper extremities: Decreased ROM, With Extension Muscle Spasms Occurs. Right Hand Muscle Strength 4/6 Left Hand 3/5. Right Flank Hypersensitivity Back without spinal or Paraspinal Tenderness Lower extremities: Extension Spasms Occurs Using Cadillac Walker Arises from chair with ease.  Neurological: She is alert and oriented to person, place, and time.  Skin: Skin is warm and dry.  Psychiatric: She has a normal mood and affect.          Assessment & Plan:  1. Chronic seizure disorder: No seizure's. Continue Gabapentin. Neurology Following  2. Chronic muscle spasms, weakness with associated pain disorder: Continue Baclofen and MOBIC. Continue with Exercise regime. PT,OT, and Speech Therapy Ordered 3. Chronic dysphagia: GI Following 4. Anxiety with depression : Stable. Continue Elavil  5. Fibromyalgia:  Refilled: oxyCODONE 5/325mg  one tablet every 6 hours as needed #120. Her PMD trying to find her a rheumatologist  30 minutes of face to face patient care time was spent during this visit. All questions were encouraged and answered.  F/U in 1 month

## 2014-04-26 NOTE — Telephone Encounter (Signed)
Speech, occupational and physical therapy ordered.

## 2014-04-26 NOTE — Telephone Encounter (Signed)
Patient would like to go to [facility] patient rehab.

## 2014-04-26 NOTE — Patient Instructions (Signed)
May increase elavil to two tablets at bedtime. Try this for a week. Call the office if it's working so a new script can be faxed to pharmacy.

## 2014-05-03 ENCOUNTER — Other Ambulatory Visit: Payer: Self-pay | Admitting: Nurse Practitioner

## 2014-05-03 ENCOUNTER — Telehealth: Payer: Self-pay | Admitting: *Deleted

## 2014-05-03 MED ORDER — AMITRIPTYLINE HCL 50 MG PO TABS: 50.0000 mg | ORAL_TABLET | Freq: Every day | ORAL | Status: DC

## 2014-05-03 MED ORDER — ALBUTEROL SULFATE HFA 108 (90 BASE) MCG/ACT IN AERS: 2.0000 | INHALATION_SPRAY | Freq: Four times a day (QID) | RESPIRATORY_TRACT | Status: DC | PRN

## 2014-05-03 NOTE — Telephone Encounter (Signed)
Autumn LyonsCasey called and said that the increase in elavil at night from 25 to 50 mg has helped her sleep.  She would like to have that sent to the pharmacy. Per [name] NP visit note 04/26/14 "May increase elavil to two tablets at bedtime. Try this for a week. Call the office if it's working so a new script can be faxed to pharmacy".  A new order will be sent to the pharmacy for the 50 mg dose.

## 2014-05-05 ENCOUNTER — Ambulatory Visit (INDEPENDENT_AMBULATORY_CARE_PROVIDER_SITE_OTHER): Payer: 59 | Admitting: Internal Medicine

## 2014-05-05 DIAGNOSIS — R0989 Other specified symptoms and signs involving the circulatory and respiratory systems: Secondary | ICD-10-CM

## 2014-05-05 DIAGNOSIS — R0600 Dyspnea, unspecified: Secondary | ICD-10-CM

## 2014-05-05 DIAGNOSIS — R0609 Other forms of dyspnea: Secondary | ICD-10-CM

## 2014-05-05 LAB — PULMONARY FUNCTION TEST
FEF 25-75 Post: 1.76 L/sec
FEF 25-75 Pre: 0.91 L/sec
FEF2575-%CHANGE-POST: 93 %
FEF2575-%PRED-POST: 47 %
FEF2575-%PRED-PRE: 24 %
FEV1-%Change-Post: 27 %
FEV1-%PRED-PRE: 35 %
FEV1-%Pred-Post: 44 %
FEV1-PRE: 1.26 L
FEV1-Post: 1.6 L
FEV1FVC-%Change-Post: -2 %
FEV1FVC-%PRED-PRE: 83 %
FEV6-%Change-Post: 30 %
FEV6-%PRED-POST: 55 %
FEV6-%Pred-Pre: 42 %
FEV6-POST: 2.34 L
FEV6-Pre: 1.79 L
FEV6FVC-%Pred-Post: 101 %
FEV6FVC-%Pred-Pre: 101 %
FVC-%CHANGE-POST: 30 %
FVC-%PRED-PRE: 41 %
FVC-%Pred-Post: 54 %
FVC-Post: 2.34 L
FVC-Pre: 1.79 L
POST FEV1/FVC RATIO: 69 %
PRE FEV1/FVC RATIO: 70 %
Post FEV6/FVC ratio: 100 %
Pre FEV6/FVC Ratio: 100 %
RV % pred: 79 %
RV: 1.26 L
TLC % pred: 70 %
TLC: 3.97 L

## 2014-05-05 NOTE — Progress Notes (Signed)
PFT done today. 

## 2014-05-26 ENCOUNTER — Encounter: Payer: 59 | Attending: Physical Medicine & Rehabilitation | Admitting: Registered Nurse

## 2014-05-26 ENCOUNTER — Encounter: Payer: Self-pay | Admitting: Registered Nurse

## 2014-05-26 VITALS — BP 136/80 | HR 95 | Resp 14 | Wt 194.0 lb

## 2014-05-26 DIAGNOSIS — IMO0001 Reserved for inherently not codable concepts without codable children: Secondary | ICD-10-CM | POA: Insufficient documentation

## 2014-05-26 DIAGNOSIS — Z79899 Other long term (current) drug therapy: Secondary | ICD-10-CM

## 2014-05-26 DIAGNOSIS — R569 Unspecified convulsions: Secondary | ICD-10-CM

## 2014-05-26 DIAGNOSIS — M791 Myalgia, unspecified site: Secondary | ICD-10-CM

## 2014-05-26 DIAGNOSIS — K589 Irritable bowel syndrome without diarrhea: Secondary | ICD-10-CM | POA: Insufficient documentation

## 2014-05-26 DIAGNOSIS — Z5181 Encounter for therapeutic drug level monitoring: Secondary | ICD-10-CM

## 2014-05-26 DIAGNOSIS — R079 Chest pain, unspecified: Secondary | ICD-10-CM

## 2014-05-26 DIAGNOSIS — R0781 Pleurodynia: Secondary | ICD-10-CM

## 2014-05-26 DIAGNOSIS — M62838 Other muscle spasm: Secondary | ICD-10-CM | POA: Insufficient documentation

## 2014-05-26 DIAGNOSIS — M797 Fibromyalgia: Secondary | ICD-10-CM

## 2014-05-26 MED ORDER — OXYCODONE-ACETAMINOPHEN 5-325 MG PO TABS: 1.0000 | ORAL_TABLET | Freq: Four times a day (QID) | ORAL | Status: DC | PRN

## 2014-05-26 NOTE — Progress Notes (Signed)
Subjective:    Patient ID: Autumn Davis, female    DOB: [birth year], 28 y.o.   MRN: [id number]  HPI: Autumn Davis is a 28 year old female who returns for follow up for chronic pain and medication refill. She says her pain is in her rib cage right greater than left. She rates her pain 8. Her current exercise regime is performing stretching exercises, she is using the Bands as well. She is attending Physical Therapy, Occupational Therapy, and Occupational/Cognitive Therapy Weekly. She is making progress with therapy. She is using straight cane in her home. Using the cadillac walker today. She says she hasn't had a seizure in 9 days, says the neurontin is helping. Pain Inventory Average Pain 9 Pain Right Now 8 My pain is constant, sharp, burning, stabbing, tingling and aching  In the last 24 hours, has pain interfered with the following? General activity 8 Relation with others 8 Enjoyment of life 7 What TIME of day is your pain at its worst? evening and night Sleep (in general) Fair  Pain is worse with: walking, bending, sitting, standing and some activites Pain improves with: rest, heat/ice, therapy/exercise, medication and TENS Relief from Meds: 3  Mobility walk with assistance use a cane use a walker ability to climb steps?  no do you drive?  no  Function disabled: date disabled . I need assistance with the following:  dressing, meal prep, household duties and shopping  Neuro/Psych bladder control problems weakness numbness tremor tingling trouble walking spasms  Prior Studies Any changes since last visit?  no  Physicians involved in your care Any changes since last visit?  no   Family History  Problem Relation Age of Onset  . Thyroid disease Mother   . Cancer Maternal Grandmother     lung  . Cancer Paternal Grandmother     colon/pancreatiec/lymphoma   History   Social History  . Marital Status: Married    Spouse Name: N/A   Number of Children: N/A  . Years of Education: N/A   Occupational History  . disabled    Social History Main Topics  . Smoking status: Never Smoker   . Smokeless tobacco: Never Used  . Alcohol Use: No  . Drug Use: No  . Sexual Activity: None   Other Topics Concern  . None   Social History Narrative  . None   Past Surgical History  Procedure Laterality Date  . Knee surgery  2002/2003    bil  . Rectal surgery  correction of prolapse    2010   Past Medical History  Diagnosis Date  . GERD (gastroesophageal reflux disease)   . Hyperlipidemia   . Seizures   . Thyroid disease   . Bronchitis, chronic/intermittent 01/22/2012  . Depression 01/22/2012  . IBS (irritable bowel syndrome) 01/22/2012  . Neuromuscular disorder   . SOB (shortness of breath)    BP 136/80  Pulse 95  Resp 14  Wt 194 lb (87.998 kg)  SpO2 99%  Opioid Risk Score:   Fall Risk Score: High Fall Risk (>13 points) (previoulsy educated and given handout on fall prevention in the home) Review of Systems  Genitourinary:       Bladder control problems  Musculoskeletal:       Spasms  Neurological: Positive for tremors, weakness and numbness.       Tingling  All other systems reviewed and are negative.      Objective:   Physical Exam  Nursing note  and vitals reviewed. Constitutional: She is oriented to person, place, and time. She appears well-developed and well-nourished.  HENT:  Head: Normocephalic and atraumatic.  Neck: Normal range of motion. Neck supple.  Cardiovascular: Normal rate, regular rhythm and normal heart sounds.   Pulmonary/Chest: Effort normal and breath sounds normal.  Musculoskeletal:  Normal Muscle Bulk and Muscle Testing Reveals: Upper Extremities: Decreased ROM 120 Degrees. With Extension Spasms Noted.  Thoracic Tenderness: T-7- T-8 Lower Extremities: Decreased ROM with Extension/ Spasms Noted with the Right Lower Leg. Using [employer] from Chair with Ease Wide  Based Gait  Neurological: She is alert and oriented to person, place, and time.  Skin: Skin is warm and dry.  Psychiatric: She has a normal mood and affect.          Assessment & Plan:  1. Chronic seizure disorder: No seizure's. Continue Gabapentin. Neurology Following  2. Chronic muscle spasms, weakness with associated pain disorder: Continue Baclofen and MOBIC. Continue with Exercise regime. Continue with PT,OT, and Speech Therapy. 3. Chronic dysphagia: GI Following  4. Anxiety with depression : Stable. Continue Elavil  5. Fibromyalgia: Continue with exercise and heat Therapy. Refilled: oxyCODONE 5/325mg  one tablet every 6 hours as needed #120.   20 minutes of face to face patient care time was spent during this visit. All questions were encouraged and answered.   F/U in 1 month

## 2014-06-12 ENCOUNTER — Ambulatory Visit (INDEPENDENT_AMBULATORY_CARE_PROVIDER_SITE_OTHER): Payer: 59 | Admitting: Nurse Practitioner

## 2014-06-12 ENCOUNTER — Encounter: Payer: Self-pay | Admitting: Nurse Practitioner

## 2014-06-12 VITALS — BP 118/83 | HR 91 | Temp 98.9°F | Ht 68.0 in | Wt 189.2 lb

## 2014-06-12 DIAGNOSIS — F32A Depression, unspecified: Secondary | ICD-10-CM

## 2014-06-12 DIAGNOSIS — E785 Hyperlipidemia, unspecified: Secondary | ICD-10-CM

## 2014-06-12 DIAGNOSIS — F3289 Other specified depressive episodes: Secondary | ICD-10-CM

## 2014-06-12 DIAGNOSIS — M62838 Other muscle spasm: Secondary | ICD-10-CM

## 2014-06-12 DIAGNOSIS — M545 Low back pain, unspecified: Secondary | ICD-10-CM

## 2014-06-12 DIAGNOSIS — F329 Major depressive disorder, single episode, unspecified: Secondary | ICD-10-CM

## 2014-06-12 DIAGNOSIS — K219 Gastro-esophageal reflux disease without esophagitis: Secondary | ICD-10-CM

## 2014-06-12 DIAGNOSIS — IMO0001 Reserved for inherently not codable concepts without codable children: Secondary | ICD-10-CM

## 2014-06-12 DIAGNOSIS — M797 Fibromyalgia: Secondary | ICD-10-CM

## 2014-06-12 DIAGNOSIS — K589 Irritable bowel syndrome without diarrhea: Secondary | ICD-10-CM

## 2014-06-12 DIAGNOSIS — E079 Disorder of thyroid, unspecified: Secondary | ICD-10-CM

## 2014-06-12 DIAGNOSIS — R569 Unspecified convulsions: Secondary | ICD-10-CM

## 2014-06-12 NOTE — Progress Notes (Signed)
Subjective:    Patient ID: Autumn Davis, female    DOB: May 01, 1986, 28 y.o.   MRN: [id number]  HPI Patient in today for 3 month follow up- she has multiple medical problems- including lower ext weakness  With history of paralysis. SHe is doing some better and is walking well wth a cane today- SH esays that her seizures are getting better she had a tiny ine yesterday- last big seizure was 15 dya sago- sees neurologist. They still have not determined what is causing her lower ext weakness and intermittent paralysis. Patient Active Problem List   Diagnosis Date Noted  . Dyspnea 04/10/2014  . Fibromyalgia 12/14/2013  . Muscle spasticity 11/14/2013  . Thoracic back pain 11/14/2013  . Lumbago 11/14/2013  . Quadriplegia 01/10/2013  . Hyperlipidemia 08/26/2012  . IBS (irritable bowel syndrome) 01/22/2012  . Depression 01/22/2012  . Bronchitis, chronic/intermittent 01/22/2012  . Abdominal pain, generalized 01/22/2012  . Back pain, lumbosacral 01/22/2012  . GERD (gastroesophageal reflux disease)   . Seizures   . Thyroid disease    Outpatient Encounter Prescriptions as of 06/12/2014  Medication Sig  . acetaminophen (TYLENOL) 500 MG tablet Take 500 mg by mouth every 6 (six) hours as needed.  Marland Kitchen acetaZOLAMIDE (DIAMOX) 250 MG tablet Take 250 mg by mouth 3 (three) times daily.   Marland Kitchen albuterol (PROVENTIL HFA;VENTOLIN HFA) 108 (90 BASE) MCG/ACT inhaler Inhale 2 puffs into the lungs every 6 (six) hours as needed for wheezing or shortness of breath.  Marland Kitchen amitriptyline (ELAVIL) 50 MG tablet Take 1 tablet (50 mg total) by mouth at bedtime.  Marland Kitchen atorvastatin (LIPITOR) 40 MG tablet TAKE ONE (1) TABLET EACH DAY  . baclofen (LIORESAL) 20 MG tablet Take 1 tablet (20 mg total) by mouth 4 (four) times daily.  . cetirizine (ZYRTEC) 10 MG tablet Take 10 mg by mouth daily.  . cycloSPORINE (RESTASIS) 0.05 % ophthalmic emulsion Place 1 drop into both eyes 2 (two) times daily.  . diphenhydrAMINE (BENADRYL) 25 MG  tablet Take 25 mg by mouth every 6 (six) hours as needed. PRN  . docusate sodium (COLACE) 100 MG capsule Take 100 mg by mouth 2 (two) times daily. PRN  . EPINEPHrine (EPI-PEN) 0.3 mg/0.3 mL SOAJ injection Inject 0.3 mg into the muscle as needed.  . gabapentin (NEURONTIN) 300 MG capsule Take 1 capsule (300 mg total) by mouth 4 (four) times daily.  Marland Kitchen ipratropium-albuterol (DUONEB) 0.5-2.5 (3) MG/3ML SOLN Take 3 mLs by nebulization every 4 (four) hours as needed.  . medroxyPROGESTERone (DEPO-PROVERA) 150 MG/ML injection Inject 150 mg into the muscle as directed. Every 2 months  . meloxicam (MOBIC) 15 MG tablet Take 1 tablet (15 mg total) by mouth daily.  . montelukast (SINGULAIR) 10 MG tablet TAKE ONE TABLET DAILY AT BEDTIME  . nystatin (MYCOSTATIN) 100000 UNIT/ML suspension Take 5 mLs (500,000 Units total) by mouth 4 (four) times daily.  . Omega 3 1000 MG CAPS Take 1 capsule by mouth daily.  Marland Kitchen oxyCODONE-acetaminophen (PERCOCET) 5-325 MG per tablet Take 1 tablet by mouth every 6 (six) hours as needed for severe pain.  . promethazine (PHENERGAN) 12.5 MG tablet Take 1 tablet (12.5 mg total) by mouth every 8 (eight) hours as needed for nausea or vomiting.  . vitamin E (VITAMIN E) 400 UNIT capsule Take 400 Units by mouth daily.  Marland Kitchen omeprazole (PRILOSEC OTC) 20 MG tablet Take 20 mg by mouth daily.  . [DISCONTINUED] Budesonide-Formoterol Fumarate (SYMBICORT IN) Inhale 1 puff into the lungs 2 (two)   Subjective:    Patient ID: Autumn Davis, female    DOB: 08/22/1986, 28 y.o.   MRN: [id number]  HPI Patient in today for 3 month follow up- she has multiple medical problems- including lower ext weakness  With history of paralysis. SHe is doing some better and is walking well wth a cane today- SH esays that her seizures are getting better she had a tiny ine yesterday- last big seizure was 15 dya sago- sees neurologist. They still have not determined what is causing her lower ext weakness and intermittent paralysis. Patient Active Problem List   Diagnosis Date Noted  . Dyspnea 04/10/2014  . Fibromyalgia 12/14/2013  . Muscle spasticity 11/14/2013  . Thoracic back pain 11/14/2013  . Lumbago 11/14/2013  . Quadriplegia 01/10/2013  . Hyperlipidemia 08/26/2012  . IBS (irritable bowel syndrome) 01/22/2012  . Depression 01/22/2012  . Bronchitis, chronic/intermittent 01/22/2012  . Abdominal pain, generalized 01/22/2012  . Back pain, lumbosacral 01/22/2012  . GERD (gastroesophageal reflux disease)   . Seizures   . Thyroid disease    Outpatient Encounter Prescriptions as of 06/12/2014  Medication Sig  . acetaminophen (TYLENOL) 500 MG tablet Take 500 mg by mouth every 6 (six) hours as needed.  . acetaZOLAMIDE (DIAMOX) 250 MG tablet Take 250 mg by mouth 3 (three) times daily.   . albuterol (PROVENTIL HFA;VENTOLIN HFA) 108 (90 BASE) MCG/ACT inhaler Inhale 2 puffs into the lungs every 6 (six) hours as needed for wheezing or shortness of breath.  . amitriptyline (ELAVIL) 50 MG tablet Take 1 tablet (50 mg total) by mouth at bedtime.  . atorvastatin (LIPITOR) 40 MG tablet TAKE ONE (1) TABLET EACH DAY  . baclofen (LIORESAL) 20 MG tablet Take 1 tablet (20 mg total) by mouth 4 (four) times daily.  . cetirizine (ZYRTEC) 10 MG tablet Take 10 mg by mouth daily.  . cycloSPORINE (RESTASIS) 0.05 % ophthalmic emulsion Place 1 drop into both eyes 2 (two) times daily.  . diphenhydrAMINE (BENADRYL) 25 MG  tablet Take 25 mg by mouth every 6 (six) hours as needed. PRN  . docusate sodium (COLACE) 100 MG capsule Take 100 mg by mouth 2 (two) times daily. PRN  . EPINEPHrine (EPI-PEN) 0.3 mg/0.3 mL SOAJ injection Inject 0.3 mg into the muscle as needed.  . gabapentin (NEURONTIN) 300 MG capsule Take 1 capsule (300 mg total) by mouth 4 (four) times daily.  . ipratropium-albuterol (DUONEB) 0.5-2.5 (3) MG/3ML SOLN Take 3 mLs by nebulization every 4 (four) hours as needed.  . medroxyPROGESTERone (DEPO-PROVERA) 150 MG/ML injection Inject 150 mg into the muscle as directed. Every 2 months  . meloxicam (MOBIC) 15 MG tablet Take 1 tablet (15 mg total) by mouth daily.  . montelukast (SINGULAIR) 10 MG tablet TAKE ONE TABLET DAILY AT BEDTIME  . nystatin (MYCOSTATIN) 100000 UNIT/ML suspension Take 5 mLs (500,000 Units total) by mouth 4 (four) times daily.  . Omega 3 1000 MG CAPS Take 1 capsule by mouth daily.  . oxyCODONE-acetaminophen (PERCOCET) 5-325 MG per tablet Take 1 tablet by mouth every 6 (six) hours as needed for severe pain.  . promethazine (PHENERGAN) 12.5 MG tablet Take 1 tablet (12.5 mg total) by mouth every 8 (eight) hours as needed for nausea or vomiting.  . vitamin E (VITAMIN E) 400 UNIT capsule Take 400 Units by mouth daily.  . omeprazole (PRILOSEC OTC) 20 MG tablet Take 20 mg by mouth daily.  . [DISCONTINUED] Budesonide-Formoterol Fumarate (SYMBICORT IN) Inhale 1 puff into the lungs 2 (two)

## 2014-06-12 NOTE — Patient Instructions (Signed)

## 2014-06-13 LAB — CMP14+EGFR
A/G RATIO: 2.4 (ref 1.1–2.5)
ALK PHOS: 74 IU/L (ref 39–117)
ALT: 14 IU/L (ref 0–32)
AST: 16 IU/L (ref 0–40)
Albumin: 4.7 g/dL (ref 3.5–5.5)
BILIRUBIN TOTAL: 0.5 mg/dL (ref 0.0–1.2)
BUN / CREAT RATIO: 16 (ref 8–20)
BUN: 13 mg/dL (ref 6–20)
CALCIUM: 9.7 mg/dL (ref 8.7–10.2)
CO2: 17 mmol/L — ABNORMAL LOW (ref 18–29)
Chloride: 108 mmol/L (ref 97–108)
Creatinine, Ser: 0.8 mg/dL (ref 0.57–1.00)
GFR calc Af Amer: 114 mL/min/{1.73_m2} (ref >59)
GFR, EST NON AFRICAN AMERICAN: 99 mL/min/{1.73_m2} (ref >59)
GLOBULIN, TOTAL: 2 g/dL (ref 1.5–4.5)
Glucose: 82 mg/dL (ref 65–99)
Potassium: 3.5 mmol/L (ref 3.5–5.2)
SODIUM: 144 mmol/L (ref 134–144)
Total Protein: 6.7 g/dL (ref 6.0–8.5)

## 2014-06-13 LAB — NMR, LIPOPROFILE
Cholesterol: 145 mg/dL (ref 100–199)
HDL CHOLESTEROL BY NMR: 41 mg/dL (ref >39)
HDL PARTICLE NUMBER: 27.5 umol/L — AB (ref >=30.5)
LDL Particle Number: 1257 nmol/L — ABNORMAL HIGH (ref <1000)
LDL Size: 20.5 nm (ref >20.5)
LDLC SERPL CALC-MCNC: 80 mg/dL (ref 0–99)
LP-IR Score: 60 — ABNORMAL HIGH (ref <=45)
SMALL LDL PARTICLE NUMBER: 782 nmol/L — AB (ref <=527)
Triglycerides by NMR: 119 mg/dL (ref 0–149)

## 2014-06-13 LAB — THYROID PANEL WITH TSH
Free Thyroxine Index: 2 (ref 1.2–4.9)
T3 Uptake Ratio: 29 % (ref 24–39)
T4, Total: 7 ug/dL (ref 4.5–12.0)
TSH: 2.77 u[IU]/mL (ref 0.450–4.500)

## 2014-06-16 ENCOUNTER — Telehealth: Payer: Self-pay | Admitting: Internal Medicine

## 2014-06-16 MED ORDER — BUDESONIDE-FORMOTEROL FUMARATE 80-4.5 MCG/ACT IN AERO: 2.0000 | INHALATION_SPRAY | Freq: Two times a day (BID) | RESPIRATORY_TRACT | Status: DC

## 2014-06-16 NOTE — Telephone Encounter (Signed)
C/w asthma that nearly normalized after albuterol so rec restart symbicort at prev strength 2 bid and see MR to regroup and review study in detail in next 2 weeks or set up with Tammy NP or me if his schedule is full

## 2014-06-16 NOTE — Telephone Encounter (Signed)
Symbicort 80mcg 2 puff bid x 6RF refilled to pt pharmacy - [facility] in [place], [place] Pt aware of results and recs per MW Appt scheduled for MR 07/14/14 at 2:15 Nothing further needed.

## 2014-06-16 NOTE — Telephone Encounter (Signed)
MR is out of the office until 8/7.  Pt is calling back for her PFT results.  Please advise. Thanks

## 2014-06-19 ENCOUNTER — Telehealth: Payer: Self-pay | Admitting: *Deleted

## 2014-06-19 DIAGNOSIS — M797 Fibromyalgia: Secondary | ICD-10-CM

## 2014-06-19 DIAGNOSIS — M791 Myalgia, unspecified site: Secondary | ICD-10-CM

## 2014-06-19 DIAGNOSIS — K589 Irritable bowel syndrome without diarrhea: Secondary | ICD-10-CM

## 2014-06-19 DIAGNOSIS — M62838 Other muscle spasm: Secondary | ICD-10-CM

## 2014-06-19 MED ORDER — OXYCODONE-ACETAMINOPHEN 5-325 MG PO TABS: 1.0000 | ORAL_TABLET | Freq: Four times a day (QID) | ORAL | Status: DC | PRN

## 2014-06-19 NOTE — Telephone Encounter (Signed)
Contacted patient to inform her the RX is ready for pickup.

## 2014-06-19 NOTE — Telephone Encounter (Signed)
Called to get refill on her pain medication.  She is going out of town and scheduled appt for 07/25/14 with Riley LamEunice.  Rx printed to be signed by [place]ice.

## 2014-06-19 NOTE — Telephone Encounter (Signed)
Autumn Davis

## 2014-06-20 NOTE — Telephone Encounter (Signed)
Appears this was rx from another Dr.'s office, taken care of.

## 2014-06-26 ENCOUNTER — Encounter: Payer: Self-pay | Admitting: *Deleted

## 2014-07-14 ENCOUNTER — Encounter: Payer: Self-pay | Admitting: Internal Medicine

## 2014-07-14 ENCOUNTER — Ambulatory Visit (INDEPENDENT_AMBULATORY_CARE_PROVIDER_SITE_OTHER): Payer: 59 | Admitting: Internal Medicine

## 2014-07-14 VITALS — BP 110/88 | HR 93 | Ht 68.0 in | Wt 189.0 lb

## 2014-07-14 DIAGNOSIS — R0600 Dyspnea, unspecified: Secondary | ICD-10-CM

## 2014-07-14 DIAGNOSIS — J45909 Unspecified asthma, uncomplicated: Secondary | ICD-10-CM

## 2014-07-14 DIAGNOSIS — R0609 Other forms of dyspnea: Secondary | ICD-10-CM

## 2014-07-14 DIAGNOSIS — R0989 Other specified symptoms and signs involving the circulatory and respiratory systems: Secondary | ICD-10-CM

## 2014-07-14 NOTE — Patient Instructions (Addendum)
#  shortness of breath  0- still unexplained  - do High Resolution CT chest without contrast on ILD protocol. Only  Dr Leanna Battles or Dr. Trudie Reed to read - refer [facility] - Dr Gary Fleet or colleague for complete allergy evaluation and exhaled nitric oxide testing  #Followup - have CT next several days; will call with results  - return in 2 months to see me - will give you time to see Dr Gary Fleet and come back

## 2014-07-14 NOTE — Progress Notes (Signed)
Subjective:    Patient ID: Autumn Davis, female    DOB: September 25, 1986, 28 y.o.   MRN: [id number]  HPI  OV 04/04/14 Chief Complaint  Patient presents with  . Pulmonary Consult    Referred by Dr. Purcell Nails for SOB and cough x 3 years.    28 year old female accompanied by her father. Reports dyspnea and cough for the last few years. Her background history is very complex and enumerated below. Main issue is dyspnea. Insidious onset. Ongoing for a few years. Progressive. Moderate in intensity. Notices dyspnea random episodic associated with throat choking and tightness. Happens or 24 hours but particularly more so late in the evening and early morning. There is associated acid reflux for which she is on proton pump inhibitors but says these medicines make her feel worse. There is also some dyspnea with exertion and symptoms are associated with some muscle pull symptoms in bilateral infra-axillary areas. Also, epi shots help relive symptoms. 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 ANA July 2040 and was 1:80 but she says after this it rose to 1:300. She was seen by a rheumatologist in [place] but apparently she's been placed on expectant followup. She is not happy about it. In addition she is seeing a neurologist both at [facility] and [place]. 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 She uses a cane to walk but a few years ago was in a wheelchair. Multiple sclerosis is suspected but apparently has never been confirmed. The diagnoses of all this is unclear. Lyme antibody titer 2015 was normal. She has had MRI brain and cervical spine MRI in February 2014 and this was normal. Symptoms intermittent and fluctuant     There is a mild dry cough associated with this.   Dyspnea relevant hx  CXR March 2-15 - clear Hgb 13.3gm% - march 2015 Body mass index is 29.08  kg/(m^2). ANA - positive, 1:80, CCP 2, RF < 10 - July 2014 Walking desaturation test in office: 185 feet x 3 laps: lowest pulse ox 91% Echocardiogram: Reportedly  at some unknown time but she does not know when and where. I can't find this information      REC #Cough  - do cough score sheet part 1 now (update: will have to have her redo at followup)   #Shortness of breath - stop symbicort, do nebs as needed - do d-dimer blood test for blood clot evaluation  - do full PFT breathing test asap; call me once done so I Can advise next step  #Followup - depends on outcome of above tests; please call me to get results of breathing test      OV 07/14/2014  Chief Complaint  Patient presents with  . Follow-up    Pt here after PFT in 04/2014. Pt states her breathing has slightly improved since last OV. Pt states she is not needing to use her neb meds as often. Pt mild dry cough and rib pain on both sides and orthopnea.    Followup dyspnea  - She does have a having blood D-dimer test which was normal and pulmonary function test which is depicted below that shows obstruction with fairly normal flow-volume loops. She now presents with a husband. Her father is not with her. She describes that since her last visit she's actually feeling better. She says this is part of normal health cycle where she  can cycle between extremely deconditioned functional status with large shortness of breath and cough using a wheelchair and then the current state which is actually better and is able to walk with a cane. Some weeks ago my colleague reviewed her pulmonary function tests and called and Symbicort base and presumptive diagnosis of asthma which is held off taking this because in the past trial of Symbicort for a few weeks did not help her and when she came of Symbicort she did not feel any worse. She is open to taking Symbicort if I recommended that. Of note, she reports having had allergy evaluation or 5  years ago th details of which he does not know  Pulmonary function test 05/05/2014 shows FEV1 1.06/35%. Ratio 70 and consistent with obstruction. Postbronchodilator FEV1 is 1.6 L showing significant broncho-dilator response. Total lung capacity is 3.9 cm/70%.  Her spirometry today in the office it is restriction of the forced vital capacity of 2.1 L or 49% predicted FEV1 of and a ratio of over 90%   So, we are getting mixed signals on her spirometry  Review of Systems  Constitutional: Negative for fever and unexpected weight change.  HENT: Negative for congestion, dental problem, ear pain, nosebleeds, postnasal drip, rhinorrhea, sinus pressure, sneezing, sore throat and trouble swallowing.   Eyes: Negative for redness and itching.  Respiratory: Positive for cough and shortness of breath. Negative for chest tightness and wheezing.   Cardiovascular: Negative for palpitations and leg swelling.  Gastrointestinal: Negative for nausea and vomiting.  Genitourinary: Negative for dysuria.  Musculoskeletal: Negative for joint swelling.  Skin: Negative for rash.  Neurological: Negative for headaches.  Hematological: Does not bruise/bleed easily.  Psychiatric/Behavioral: Negative for dysphoric mood. The patient is not nervous/anxious.        Objective:   Physical Exam  Vitals reviewed. Constitutional: She is oriented to person, place, and time. She appears well-developed and well-nourished. No distress.  Looks better Has a cane by her  HENT:  Head: Normocephalic and atraumatic.  Right Ear: External ear normal.  Left Ear: External ear normal.  Mouth/Throat: Oropharynx is clear and moist. No oropharyngeal exudate.  Eyes: Conjunctivae and EOM are normal. Pupils are equal, round, and reactive to light. Right eye exhibits no discharge. Left eye exhibits no discharge. No scleral icterus.  Neck: Normal range of motion. Neck supple. No JVD present. No tracheal deviation present. No thyromegaly  present.  Cardiovascular: Normal rate, regular rhythm, normal heart sounds and intact distal pulses.  Exam reveals no gallop and no friction rub.   No murmur heard. Pulmonary/Chest: Effort normal and breath sounds normal. No respiratory distress. She has no wheezes. She has no rales. She exhibits no tenderness.  Abdominal: Soft. Bowel sounds are normal. She exhibits no distension and no mass. There is no tenderness. There is no rebound and no guarding.  Musculoskeletal: Normal range of motion. She exhibits no edema and no tenderness.  Lymphadenopathy:    She has no cervical adenopathy.  Neurological: She is alert and oriented to person, place, and time. She has normal reflexes. No cranial nerve deficit. She exhibits normal muscle tone. Coordination normal.  Skin: Skin is warm and dry. No rash noted. She is not diaphoretic. No erythema. No pallor.  Psychiatric: She has a normal mood and affect. Her behavior is normal. Judgment and thought content normal.     Filed Vitals:   07/14/14 1430  BP: 110/88  Pulse: 93  Height: 5\' 8"  (1.727 m)  Weight: 189 lb (85.73  kg)  SpO2: 98%        Assessment & Plan:  #shortness of breath  0- still unexplained and mixed signals on pulmonary function tst  - do High Resolution CT chest without contrast on ILD protocol. Only  Dr Leanna Battles or Dr. Trudie Reed to read - refer [facility] - Dr Gary Fleet or colleague for complete allergy evaluation and exhaled nitric oxide testing - If CT is normal , might cnsider CPST  #Followup - have CT next several days; will call with results  - return in 2 months to see me - will give you time to see Dr Gary Fleet and come b

## 2014-07-15 NOTE — Assessment & Plan Note (Signed)
#  shortness of breath  0- still unexplained and mixed signals on pulmonary function tst  - do High Resolution CT chest without contrast on ILD protocol. Only  Dr Leanna Battles or Dr. Trudie Reed to read - refer [facility] - Dr Gary Fleet or colleague for complete allergy evaluation and exhaled nitric oxide testing - If CT is normal , might cnsider CPST  #Followup - have CT next several days; will call with results  - return in 2 months to see me - will give you time to see Dr Gary Fleet and come b  > 50% of this > 25 min visit spent in face to face counseling (15 min visit converted to 25 min)

## 2014-07-20 ENCOUNTER — Ambulatory Visit (INDEPENDENT_AMBULATORY_CARE_PROVIDER_SITE_OTHER)
Admission: RE | Admit: 2014-07-20 | Discharge: 2014-07-20 | Disposition: A | Discharge status: Home / Self Care | Payer: Medicare Other | Source: Ambulatory Visit | Attending: Internal Medicine | Admitting: Internal Medicine

## 2014-07-20 DIAGNOSIS — R0989 Other specified symptoms and signs involving the circulatory and respiratory systems: Secondary | ICD-10-CM | POA: Diagnosis not present

## 2014-07-20 DIAGNOSIS — J45909 Unspecified asthma, uncomplicated: Secondary | ICD-10-CM

## 2014-07-20 DIAGNOSIS — R079 Chest pain, unspecified: Secondary | ICD-10-CM | POA: Diagnosis not present

## 2014-07-20 DIAGNOSIS — R0609 Other forms of dyspnea: Secondary | ICD-10-CM

## 2014-07-20 DIAGNOSIS — R0600 Dyspnea, unspecified: Secondary | ICD-10-CM

## 2014-07-20 DIAGNOSIS — R0602 Shortness of breath: Secondary | ICD-10-CM | POA: Diagnosis not present

## 2014-07-25 ENCOUNTER — Encounter: Payer: Medicare Other | Attending: Physical Medicine & Rehabilitation | Admitting: Registered Nurse

## 2014-07-25 ENCOUNTER — Encounter: Payer: Self-pay | Admitting: Registered Nurse

## 2014-07-25 VITALS — BP 126/84 | HR 100 | Resp 16 | Ht 68.0 in | Wt 190.0 lb

## 2014-07-25 DIAGNOSIS — R569 Unspecified convulsions: Secondary | ICD-10-CM

## 2014-07-25 DIAGNOSIS — Z5181 Encounter for therapeutic drug level monitoring: Secondary | ICD-10-CM

## 2014-07-25 DIAGNOSIS — K589 Irritable bowel syndrome without diarrhea: Secondary | ICD-10-CM

## 2014-07-25 DIAGNOSIS — R079 Chest pain, unspecified: Secondary | ICD-10-CM | POA: Diagnosis not present

## 2014-07-25 DIAGNOSIS — R0781 Pleurodynia: Secondary | ICD-10-CM

## 2014-07-25 DIAGNOSIS — M797 Fibromyalgia: Secondary | ICD-10-CM

## 2014-07-25 DIAGNOSIS — M7061 Trochanteric bursitis, right hip: Secondary | ICD-10-CM

## 2014-07-25 DIAGNOSIS — M62838 Other muscle spasm: Secondary | ICD-10-CM

## 2014-07-25 DIAGNOSIS — IMO0001 Reserved for inherently not codable concepts without codable children: Secondary | ICD-10-CM | POA: Diagnosis not present

## 2014-07-25 DIAGNOSIS — M7062 Trochanteric bursitis, left hip: Secondary | ICD-10-CM

## 2014-07-25 DIAGNOSIS — M791 Myalgia, unspecified site: Secondary | ICD-10-CM

## 2014-07-25 DIAGNOSIS — M76899 Other specified enthesopathies of unspecified lower limb, excluding foot: Secondary | ICD-10-CM

## 2014-07-25 DIAGNOSIS — Z79899 Other long term (current) drug therapy: Secondary | ICD-10-CM

## 2014-07-25 MED ORDER — OXYCODONE-ACETAMINOPHEN 7.5-325 MG PO TABS: ORAL_TABLET | ORAL | Status: DC

## 2014-07-25 MED ORDER — METHYLPREDNISOLONE 4 MG PO KIT: PACK | ORAL | Status: DC

## 2014-07-25 NOTE — Progress Notes (Signed)
Subjective:    Patient ID: Autumn Davis, female    DOB: 10/25/1986, 28 y.o.   MRN: [id number]  HPI: Mrs. Cindy Montross is a 28 year old female who returns for follow up for chronic pain and medication refill. She says her pain is in her rib cage right greater than left, bilateral hips and joint pain. She states she has been having increase intensity of joint pain over the last three weeks. Last week the pain has become intense. Also stated the pain increases in intensity at night especially on her right side.  She rates her pain 9. Her current exercise regime is performing stretching exercises. She is attending Physical Therapy Weekly. She will be attending Therapy twice a week since her insurance has changed. She is using a straight cane. Her husband in room with her. All questions answered. She has been following her Rheumatologist and neurologist appointments. Also seeing a pulmonologist, she says they told her lung capacity is 30%, oxygen saturation is 100%. Pulmonology Following. She says the neurologist is thinking about placing her on methotrexate she hasn't made her decision, she is gathering information at this time.   Pain Inventory Average Pain 8 Pain Right Now 9 My pain is constant, sharp, burning, dull, stabbing, tingling and aching  In the last 24 hours, has pain interfered with the following? General activity 9 Relation with others 7 Enjoyment of life 8 What TIME of day is your pain at its worst? evening, night Sleep (in general) Poor  Pain is worse with: walking, bending, sitting, standing and some activites Pain improves with: rest, heat/ice, therapy/exercise, medication and TENS Relief from Meds: 2  Mobility walk with assistance use a cane use a walker ability to climb steps?  no do you drive?  no use a wheelchair transfers alone  Function disabled: date disabled na I need assistance with the following:  feeding, dressing, meal prep, household  duties and shopping Do you have any goals in this area?  yes  Neuro/Psych bladder control problems bowel control problems weakness numbness tremor tingling trouble walking spasms dizziness  Prior Studies Any changes since last visit?  yes CT/MRI  Physicians involved in your care Any changes since last visit?  no   Family History  Problem Relation Age of Onset  . Thyroid disease Mother   . Cancer Maternal Grandmother     lung  . Cancer Paternal Grandmother     colon/pancreatiec/lymphoma   History   Social History  . Marital Status: Married    Spouse Name: N/A    Number of Children: N/A  . Years of Education: N/A   Occupational History  . disabled    Social History Main Topics  . Smoking status: Never Smoker   . Smokeless tobacco: Never Used  . Alcohol Use: No  . Drug Use: No  . Sexual Activity: None   Other Topics Concern  . None   Social History Narrative  . None   Past Surgical History  Procedure Laterality Date  . Knee surgery  2002/2003    bil  . Rectal surgery  correction of prolapse    2010   Past Medical History  Diagnosis Date  . GERD (gastroesophageal reflux disease)   . Hyperlipidemia   . Seizures   . Thyroid disease   . Bronchitis, chronic/intermittent 01/22/2012  . Depression 01/22/2012  . IBS (irritable bowel syndrome) 01/22/2012  . Neuromuscular disorder   . SOB (shortness of breath)  BP 126/84  Pulse 100  Resp 16  Ht 5\' 8"  (1.727 m)  Wt 190 lb (86.183 kg)  BMI 28.90 kg/m2  SpO2 100%  Opioid Risk Score:   Fall Risk Score:      Review of Systems  Constitutional: Positive for appetite change.  Respiratory: Positive for cough, shortness of breath and wheezing.   Gastrointestinal: Positive for nausea and constipation.  Genitourinary:       Bowel and bladder control problems  Neurological: Positive for dizziness, tremors, weakness and numbness.       Tingling,spasms  All other systems reviewed and are negative.        Objective:   Physical Exam  Nursing note and vitals reviewed. Constitutional: She appears well-developed and well-nourished.  HENT:  Head: Normocephalic and atraumatic.  Neck: Normal range of motion. Neck supple.  Cardiovascular: Normal rate and regular rhythm.   Pulmonary/Chest:  Decreased Effort with  Inspiration.  Musculoskeletal:  Normal Muscle Bulk and Muscle testing Reveals: Upper Extremities: Decreased ROM 90 Degrees and Muscle Strength 4/5 Thoracic Paraspinal Tenderness/ Right Flank Tenderness T-7- T-9 Lower Extremities: Decreased ROM and Muscle Strength 4/5 Arises from chair with ease Wide Based Gait          Assessment & Plan:  1. Chronic seizure disorder: No seizure's. Continue Gabapentin. Neurology Following  2. Chronic muscle spasms, weakness with associated pain disorder: Continue Baclofen and MOBIC. Continue with Exercise regime. Continue with Physical Therapy.  3. Chronic dysphagia: GI Following  4. Anxiety with depression : Stable. Continue Elavil  5. Fibromyalgia/ Chronic Pain: Continue with exercise and heat Therapy.  RX: Increased to oxyCODONE 7.5/325mg  one tablet every 6 hours as needed #120.  6. Greater Trochanteric Bursitis: Medrol Dose pak  30 minutes of face to face patient care time was spent during this visit. All questions were encouraged and answered.  F/U in 1 month

## 2014-07-26 ENCOUNTER — Telehealth: Payer: Self-pay | Admitting: Internal Medicine

## 2014-07-26 DIAGNOSIS — R0600 Dyspnea, unspecified: Secondary | ICD-10-CM

## 2014-07-26 NOTE — Telephone Encounter (Signed)
Let her know ct chest is normal. Please set her up for cpst first avail fine

## 2014-07-27 NOTE — Telephone Encounter (Signed)
ATC pt. Unable to leave vm. WCB.

## 2014-07-27 NOTE — Telephone Encounter (Signed)
Patient returning call.

## 2014-08-01 ENCOUNTER — Telehealth: Payer: Self-pay | Admitting: Internal Medicine

## 2014-08-01 NOTE — Telephone Encounter (Signed)
CPST has been scheduled for 08/18/14 at 3:00 at [facility]. Pt is concerned that this test may require to much walking. I didn't see in the order if she would be riding a bike or walking. Pt stated that she has issues where she can't do too much walking. Please advise. Rhonda J Cobb

## 2014-08-01 NOTE — Telephone Encounter (Signed)
Called and spoke to pt. Informed her of the results and recs for cpst per MR. Pt verbalized understanding and denied any further questions or concerns at this time. Pt aware to have f/u appt with MR after cpst. Order placed. Nothing further needed.

## 2014-08-01 NOTE — Telephone Encounter (Signed)
They typically do bike: might be helpful in order to say "do bike cpst"

## 2014-08-02 NOTE — Telephone Encounter (Signed)
No, I called and spoke with Arline Asp in the CPST Lab at [facility] and she went inside the original order and placed an edit note to "preform test on bike". Per Arline Asp at [facility] [place], no other other is needed. Rhonda J Cobb

## 2014-08-02 NOTE — Telephone Encounter (Signed)
Spoke with pt.  Informed her test is typically done on bike and we would clarify order to have this included as a "bike cpst."   Bjorn Loser, do we need to place another order to ensure they will do the "bike cpst" or can you call and provide this specific information.  Please advise.  Thank you.

## 2014-08-08 ENCOUNTER — Telehealth: Payer: Self-pay | Admitting: Registered Nurse

## 2014-08-08 NOTE — Telephone Encounter (Signed)
Called and Left Message to call office

## 2014-08-08 NOTE — Telephone Encounter (Signed)
I spoke to [name]: She says after a few days of finishing the Medrol Dose pak I prescribed to her she became stiff and weak. I spoke with Dr. Riley Kill. We will prescribe her Prednisone 10 mg daily for a week then 5 mg daily.  Also her October appointment will be with Dr. Riley Kill on 08/29/14. She is aware. Prednisone was called into her pharmacy and [name] is aware.

## 2014-08-18 ENCOUNTER — Ambulatory Visit (HOSPITAL_COMMUNITY): Payer: 59 | Attending: Internal Medicine

## 2014-08-18 DIAGNOSIS — R0600 Dyspnea, unspecified: Secondary | ICD-10-CM | POA: Diagnosis present

## 2014-08-24 ENCOUNTER — Other Ambulatory Visit: Payer: Self-pay | Admitting: *Deleted

## 2014-08-24 ENCOUNTER — Encounter: Payer: 59 | Admitting: Registered Nurse

## 2014-08-24 ENCOUNTER — Ambulatory Visit: Payer: Medicare Other | Admitting: Registered Nurse

## 2014-08-24 ENCOUNTER — Telehealth: Payer: Self-pay | Admitting: *Deleted

## 2014-08-24 MED ORDER — OXYCODONE-ACETAMINOPHEN 7.5-325 MG PO TABS: ORAL_TABLET | ORAL | Status: DC

## 2014-08-24 MED ORDER — PREDNISONE 5 MG PO TABS: 5.0000 mg | ORAL_TABLET | Freq: Every day | ORAL | Status: DC

## 2014-08-24 NOTE — Telephone Encounter (Signed)
6 days of additional prednisone 5 mg per day ordered to get Autumn Davis to her appt with Autumn Davis 08/29/14 . Sent to pharmacy electronically.

## 2014-08-24 NOTE — Telephone Encounter (Signed)
Additional (#6 )5 mg tablets of prednisone was sent to pharmacy to cover Autumn Davis until her appt with Riley KillSwartz on 08/29/14.  She had been given 10 mg x7 days and 1/2 tab (5mg ) for another week called to pharmacy on 08/08/14. She said she was out today when she picked up her percocet rx and so we refilled enough to get her to the 08/29/14 appt.

## 2014-08-24 NOTE — Progress Notes (Unsigned)
Error, no visit occurred. RX given due to appt with MD on Monday

## 2014-08-24 NOTE — Telephone Encounter (Signed)
Pt allowed to pick up rx today because has appt with Riley KillSwartz on Monday.

## 2014-08-29 ENCOUNTER — Encounter: Payer: Self-pay | Admitting: Physical Medicine & Rehabilitation

## 2014-08-29 ENCOUNTER — Encounter: Payer: 59 | Attending: Physical Medicine & Rehabilitation | Admitting: Physical Medicine & Rehabilitation

## 2014-08-29 VITALS — HR 90 | Resp 14 | Ht 68.0 in | Wt 196.0 lb

## 2014-08-29 DIAGNOSIS — R569 Unspecified convulsions: Secondary | ICD-10-CM | POA: Diagnosis not present

## 2014-08-29 DIAGNOSIS — M797 Fibromyalgia: Secondary | ICD-10-CM

## 2014-08-29 DIAGNOSIS — M545 Low back pain: Secondary | ICD-10-CM | POA: Diagnosis not present

## 2014-08-29 DIAGNOSIS — M62838 Other muscle spasm: Secondary | ICD-10-CM

## 2014-08-29 DIAGNOSIS — M6249 Contracture of muscle, multiple sites: Secondary | ICD-10-CM

## 2014-08-29 DIAGNOSIS — M329 Systemic lupus erythematosus, unspecified: Secondary | ICD-10-CM | POA: Diagnosis not present

## 2014-08-29 MED ORDER — OXYCODONE-ACETAMINOPHEN 7.5-325 MG PO TABS: ORAL_TABLET | ORAL | Status: DC

## 2014-08-29 MED ORDER — GABAPENTIN 300 MG PO CAPS: 300.0000 mg | ORAL_CAPSULE | ORAL | Status: DC

## 2014-08-29 MED ORDER — PREDNISONE 5 MG PO TABS: 5.0000 mg | ORAL_TABLET | Freq: Every day | ORAL | Status: DC

## 2014-08-29 NOTE — Patient Instructions (Signed)
TAKE TWO GABAPENTIN AT BEDTIME   HAVE YOUR NEUROLOGIST PROVIDE ME SOME INFORMATION ABOUT YOUR CONDITION

## 2014-08-29 NOTE — Progress Notes (Signed)
Subjective:    Patient ID: Autumn Davis, female    DOB: 31-Jan-1986, 28 y.o.   MRN: [id number]  HPI  Autumn Davis is back regarding her chronic pain and functional issues.  She had a relapse with increased pain and muscle tightness at the end of last month. We placed her on low dose prednisone has helped a great deal with her pain, muscle spasm/tightness, and even her vision.   Apparently she sees rheumatology here soon and has follow up with neurology at [facility]---methotrexate has been mentioned as another option for treatment. Recent MRI perhaps showed some lesions and CSF revealed WBC's per patient.  She struggles getting to sleep due to generalized pain. She often feels sleepy but cannot get to sleep. Increasing the gabapentin seems to have helped a little bit. It also has helped in decreasing any seizure activity which has been taking place. The elavil helps also to a certain extent with sleep and night time pain. Her night time pain symptoms generally consist of burning pain, sometime spasms in the LE's.       Pain Inventory Average Pain 9 Pain Right Now 7 My pain is constant, sharp, burning, dull, stabbing, tingling and aching  In the last 24 hours, has pain interfered with the following? General activity 8 Relation with others 7 Enjoyment of life 8 What TIME of day is your pain at its worst? evening and evening Sleep (in general) Poor  Pain is worse with: walking, bending, sitting and standing Pain improves with: rest, heat/ice, medication and TENS Relief from Meds: 5  Mobility use a walker do you drive?  no  Function disabled: date disabled .  Neuro/Psych bladder control problems bowel control problems weakness numbness tremor tingling trouble walking spasms dizziness  Prior Studies Any changes since last visit?  no  Physicians involved in your care Any changes since last visit?  no   Family History  Problem Relation Age of Onset  . Thyroid disease  Mother   . Cancer Maternal Grandmother     lung  . Cancer Paternal Grandmother     colon/pancreatiec/lymphoma   History   Social History  . Marital Status: Married    Spouse Name: N/A    Number of Children: N/A  . Years of Education: N/A   Occupational History  . disabled    Social History Main Topics  . Smoking status: Never Smoker   . Smokeless tobacco: Never Used  . Alcohol Use: No  . Drug Use: No  . Sexual Activity: None   Other Topics Concern  . None   Social History Narrative  . None   Past Surgical History  Procedure Laterality Date  . Knee surgery  2002/2003    bil  . Rectal surgery  correction of prolapse    2010   Past Medical History  Diagnosis Date  . GERD (gastroesophageal reflux disease)   . Hyperlipidemia   . Seizures   . Thyroid disease   . Bronchitis, chronic/intermittent 01/22/2012  . Depression 01/22/2012  . IBS (irritable bowel syndrome) 01/22/2012  . Neuromuscular disorder   . SOB (shortness of breath)    Pulse 90  Resp 14  Ht 5\' 8"  (1.727 m)  Wt 196 lb (88.905 kg)  BMI 29.81 kg/m2  SpO2 98%  Opioid Risk Score:   Fall Risk Score: Moderate Fall Risk (6-13 points)   Review of Systems     Objective:   Physical Exam   General: Alert and oriented x 3,  No apparent distress  HEENT: Head is normocephalic, atraumatic, PERRLA, EOMI, sclera anicteric, oral mucosa pink and moist, dentition intact, ext ear canals clear,  Neck: Supple without JVD or lymphadenopathy  Heart: Reg rate and rhythm. No murmurs rubs or gallops  Chest: CTA bilaterally without wheezes, rales, or rhonchi; no distress  Abdomen: Soft, non-tender, non-distended, bowel sounds positive.  Extremities: No clubbing, cyanosis, or edema. Pulses are 2+  Skin: Clean and intact without signs of breakdown. She had a malar rash over the neck.  Neuro: Pt displays generally intact insight and awareness. CN exam appears intact without focal abnormalities today. She has good vocal  quality, cough. Vision intact. Strength is grossly 4/5 in the upper limbs with improved ROM noted. Sensation is decreased below the waist bilaterally, distal more than proximal to PP/LT. She has decreased proprioception in both legs as well. Muscle tightness/tone appears decreased today. DTR's remain today in general at 1+ .gait was improved. She used a rolling walker and took her time---she was deliberate but used very good form, posture was fairly appropriate. Musculoskeletal: She has generalized joint pain but now swelling or gross abnormalities. She has a lot of tenderness in the areas of muscle and tendon tightness. She has pain along her right lower rib cage and obliques. Posture seems a little better when sitting.  Psych: appears more alert. Less anxious today.   Assessment & Plan:   1. Chronic seizure disorder---? myoclonic jerks/myotonia/ or some other abnormal muscle movement as opposed to seizures? ?MS  2. Chronic muscle spasms, weakness with associated pain disorder---?autoimmune disorder, ?lupus-like syndrome.  3. Chronic dysphagia  4. Right low back/flank pain  5. Likely fibromyalgia in the setting of whatever autoimmune syndrome is taking place  6. Anxiety with depression  7. Pulmonary function abnormalities., ?CO2 retention?   Plan:  1. Maintain elavil at 50mg  qhs   2. Maintain baclofen to 20mg  for muscle spasms. Consider klonopin trial in the future.  3. Follow up with neurology and rheum regarding the management of her seizures and presentation. Marland Kitchen MTX trial -continue oral prednisone 5mg  daily for time being until she sees rheum and neuro.  -asked that her neurologist forward me some information regarding her treatment plan 4. Continue meloxicam 15mg  daily  5. Oxycodone 7.5mg  fr breakthrough pain #120  6. Increase gabapentin to 300mg  tid and 600mg  qhs for seizures, spasms, neuro pain  7. Continue with exercises and adaptive equipment. She does well when she takes her time.    8. Follow up with me in about 7 weeks. 30 minutes of face to face patient care time were spent during this visit. All questions were encouraged and answered.

## 2014-08-31 ENCOUNTER — Ambulatory Visit (INDEPENDENT_AMBULATORY_CARE_PROVIDER_SITE_OTHER): Payer: 59 | Admitting: Nurse Practitioner

## 2014-08-31 ENCOUNTER — Encounter: Payer: Self-pay | Admitting: Nurse Practitioner

## 2014-08-31 ENCOUNTER — Other Ambulatory Visit: Payer: Self-pay | Admitting: *Deleted

## 2014-08-31 VITALS — BP 132/91 | HR 100 | Temp 100.3°F | Ht 68.0 in | Wt 198.2 lb

## 2014-08-31 DIAGNOSIS — G8250 Quadriplegia, unspecified: Secondary | ICD-10-CM | POA: Diagnosis not present

## 2014-08-31 DIAGNOSIS — F329 Major depressive disorder, single episode, unspecified: Secondary | ICD-10-CM

## 2014-08-31 DIAGNOSIS — R569 Unspecified convulsions: Secondary | ICD-10-CM | POA: Diagnosis not present

## 2014-08-31 DIAGNOSIS — Z23 Encounter for immunization: Secondary | ICD-10-CM

## 2014-08-31 DIAGNOSIS — E785 Hyperlipidemia, unspecified: Secondary | ICD-10-CM

## 2014-08-31 DIAGNOSIS — M6249 Contracture of muscle, multiple sites: Secondary | ICD-10-CM

## 2014-08-31 DIAGNOSIS — M62838 Other muscle spasm: Secondary | ICD-10-CM

## 2014-08-31 DIAGNOSIS — K589 Irritable bowel syndrome without diarrhea: Secondary | ICD-10-CM | POA: Diagnosis not present

## 2014-08-31 DIAGNOSIS — F32A Depression, unspecified: Secondary | ICD-10-CM

## 2014-08-31 DIAGNOSIS — D8989 Other specified disorders involving the immune mechanism, not elsewhere classified: Secondary | ICD-10-CM

## 2014-08-31 DIAGNOSIS — M797 Fibromyalgia: Secondary | ICD-10-CM

## 2014-08-31 DIAGNOSIS — L930 Discoid lupus erythematosus: Secondary | ICD-10-CM

## 2014-08-31 DIAGNOSIS — E079 Disorder of thyroid, unspecified: Secondary | ICD-10-CM

## 2014-08-31 DIAGNOSIS — K219 Gastro-esophageal reflux disease without esophagitis: Secondary | ICD-10-CM | POA: Diagnosis not present

## 2014-08-31 MED ORDER — OMEPRAZOLE 40 MG PO CPDR: 40.0000 mg | DELAYED_RELEASE_CAPSULE | Freq: Every day | ORAL | Status: DC

## 2014-08-31 NOTE — Patient Instructions (Signed)

## 2014-08-31 NOTE — Addendum Note (Signed)
Addended by: Bennie PieriniMARTIN, MARY-MARGARET on: 08/31/2014 12:27 PM   Modules accepted: Orders

## 2014-08-31 NOTE — Progress Notes (Signed)
Subjective:    Patient ID: Autumn Davis, female    DOB: 18-Jun-1986, 28 y.o.   MRN: [id number]  HPI Patient here today for follow up. She has an autoimmune disorder that they cannot pin point exactly what it is. They think that it may be a combination or RA, Lupus and MS. None of her test are conclusive. She has had more frequent flare ups and has had episodes that she was unable to walk. Prednisone seems to help. 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 He put her on prednisone 5mg  daily and that is really helping.Her rheumatlogist will not see her because her labs do not meet their needs. Would like to see someone else. She is having a good day today.    Review of Systems  Constitutional: Negative.   HENT: Negative.   Respiratory: Negative.   Cardiovascular: Negative.   Genitourinary: Negative.   Neurological: Negative.   Psychiatric/Behavioral: Negative.   All other systems reviewed and are negative.      Objective:   Physical Exam  Constitutional: She is oriented to person, place, and time. She appears well-developed and well-nourished.  HENT:  Nose: Nose normal.  Mouth/Throat: Oropharynx is clear and moist.  Eyes: EOM are normal.  Neck: Trachea normal, normal range of motion and full passive range of motion without pain. Neck supple. No JVD present. Carotid bruit is not present. No thyromegaly present.  Cardiovascular: Normal rate, regular rhythm, normal heart sounds and intact distal pulses.  Exam reveals no gallop and no friction rub.   No murmur heard. Pulmonary/Chest: Effort normal and breath sounds normal.  Abdominal: Soft. Bowel sounds are normal. She exhibits no distension and no mass. There is no tenderness.  Musculoskeletal: Normal range of motion.  Lymphadenopathy:    She has no cervical adenopathy.  Neurological: She is alert and oriented to person, place, and  time. She has normal reflexes.  Skin: Skin is warm and dry.  Psychiatric: She has a normal mood and affect. Her behavior is normal. Judgment and thought content normal.    BP 132/91  Pulse 100  Temp(Src) 100.3 F (37.9 C) (Oral)  Ht 5\' 8"  (1.727 m)  Wt 198 lb 3.2 oz (89.903 kg)  BMI 30.14 kg/m2       Assessment & Plan:  1. Thyroid disease  2. Seizures  3. Quadriplegia  4. Muscle spasticity  5. IBS (irritable bowel syndrome)  6. Hyperlipidemia - CMP14+EGFR - NMR, lipoprofile  7. Gastroesophageal reflux disease without esophagitis  8. Fibromyalgia  9. 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

## 2014-09-04 ENCOUNTER — Other Ambulatory Visit: Payer: 59

## 2014-09-04 LAB — CMP14+EGFR
ALBUMIN: 4.3 g/dL (ref 3.5–5.5)
ALK PHOS: 88 IU/L (ref 39–117)
ALT: 15 IU/L (ref 0–32)
AST: 13 IU/L (ref 0–40)
Albumin/Globulin Ratio: 2.2 (ref 1.1–2.5)
BILIRUBIN TOTAL: 0.4 mg/dL (ref 0.0–1.2)
BUN / CREAT RATIO: 12 (ref 8–20)
BUN: 10 mg/dL (ref 6–20)
CHLORIDE: 109 mmol/L — AB (ref 97–108)
CO2: 19 mmol/L (ref 18–29)
Calcium: 8.9 mg/dL (ref 8.7–10.2)
Creatinine, Ser: 0.84 mg/dL (ref 0.57–1.00)
GFR calc Af Amer: 108 mL/min/{1.73_m2} (ref >59)
GFR calc non Af Amer: 94 mL/min/{1.73_m2} (ref >59)
Globulin, Total: 2 g/dL (ref 1.5–4.5)
Glucose: 103 mg/dL — ABNORMAL HIGH (ref 65–99)
POTASSIUM: 3.5 mmol/L (ref 3.5–5.2)
Sodium: 145 mmol/L — ABNORMAL HIGH (ref 134–144)
Total Protein: 6.3 g/dL (ref 6.0–8.5)

## 2014-09-04 LAB — THYROID PANEL WITH TSH
Free Thyroxine Index: 2.3 (ref 1.2–4.9)
T3 UPTAKE RATIO: 32 % (ref 24–39)
T4 TOTAL: 7.3 ug/dL (ref 4.5–12.0)
TSH: 1.67 u[IU]/mL (ref 0.450–4.500)

## 2014-09-04 LAB — NMR, LIPOPROFILE
CHOLESTEROL: 153 mg/dL (ref 100–199)
HDL Cholesterol by NMR: 56 mg/dL (ref >39)
HDL Particle Number: 34.2 umol/L (ref >=30.5)
LDL Particle Number: 1039 nmol/L — ABNORMAL HIGH (ref <1000)
LDL Size: 20.9 nm (ref >20.5)
LDLC SERPL CALC-MCNC: 73 mg/dL (ref 0–99)
LP-IR SCORE: 43 (ref <=45)
Small LDL Particle Number: 566 nmol/L — ABNORMAL HIGH (ref <=527)
Triglycerides by NMR: 119 mg/dL (ref 0–149)

## 2014-09-04 LAB — ACETAZOLAMIDE, (DIAMOX), S/P

## 2014-09-04 LAB — ANA: ANA: NEGATIVE

## 2014-09-05 ENCOUNTER — Other Ambulatory Visit: Payer: Self-pay | Admitting: Nurse Practitioner

## 2014-09-05 MED ORDER — SULFAMETHOXAZOLE-TMP DS 800-160 MG PO TABS: 1.0000 | ORAL_TABLET | Freq: Two times a day (BID) | ORAL | Status: DC

## 2014-09-08 ENCOUNTER — Encounter: Payer: Self-pay | Admitting: Internal Medicine

## 2014-09-08 ENCOUNTER — Ambulatory Visit (INDEPENDENT_AMBULATORY_CARE_PROVIDER_SITE_OTHER): Payer: Medicare Other | Admitting: Internal Medicine

## 2014-09-08 VITALS — BP 124/90 | HR 100 | Ht 68.0 in | Wt 200.0 lb

## 2014-09-08 DIAGNOSIS — R0600 Dyspnea, unspecified: Secondary | ICD-10-CM | POA: Diagnosis not present

## 2014-09-08 NOTE — Patient Instructions (Signed)
#  Dyspnea  = CPST bike test 08/18/14 suggests neuromuscular problems, physical deconditoning or diastolic dysfunction as likely causes of shortness of breath  Plan  - do echo  - refer to DR Celine Mansonnika Patel - neuro-muscular specialist  [place]  - return to see me in 6 months to reviiew

## 2014-09-08 NOTE — Progress Notes (Signed)
Subjective:    Patient ID: Autumn Davis, female    DOB: 03-05-1986, 28 y.o.   MRN: [id number] Autumn Moloney, MD  HPI   OV 04/04/14 Chief Complaint  Patient presents with  . Pulmonary Consult    Referred by Dr. Purcell Nails for SOB and cough x 3 years.    28 year old female accompanied by her father. Reports dyspnea and cough for the last few years. Her background history is very complex and enumerated below. Main issue is dyspnea. Insidious onset. Ongoing for a few years. Progressive. Moderate in intensity. Notices dyspnea random episodic associated with throat choking and tightness. Happens or 24 hours but particularly more so late in the evening and early morning. There is associated acid reflux for which she is on proton pump inhibitors but says these medicines make her feel worse. There is also some dyspnea with exertion and symptoms are associated with some muscle pull symptoms in bilateral infra-axillary areas. Also, epi shots help relive symptoms. 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 ANA July 2040 and was 1:80 but she says after this it rose to 1:300. She was seen by a rheumatologist in [place] but apparently she's been placed on expectant followup. She is not happy about it. In addition she is seeing a neurologist both at [facility] and [place]. 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 She uses a cane to walk but a few years ago was in a wheelchair. Multiple sclerosis is suspected but apparently has never been confirmed. The diagnoses of all this is unclear. Lyme antibody titer 2015 was normal. She has had MRI brain and cervical spine MRI in February 2014 and this was normal. Symptoms intermittent and fluctuant     There is a mild dry cough associated with this.   Dyspnea relevant hx  CXR March 2-15 - clear Hgb 13.3gm% - march 2015 Body  mass index is 29.08 kg/(m^2). ANA - positive, 1:80, CCP 2, RF < 10 - July 2014 Walking desaturation test in office: 185 feet x 3 laps: lowest pulse ox 91% Echocardiogram: Reportedly  at some unknown time but she does not know when and where. I can't find this information      REC #Cough  - do cough score sheet part 1 now (update: will have to have her redo at followup)   #Shortness of breath - stop symbicort, do nebs as needed - do d-dimer blood test for blood clot evaluation  - do full PFT breathing test asap; call me once done so I Can advise next step  #Followup - depends on outcome of above tests; please call me to get results of breathing test      OV 07/14/2014  Chief Complaint  Patient presents with  . Follow-up    Pt here after PFT in 04/2014. Pt states her breathing has slightly improved since last OV. Pt states she is not needing to use her neb meds as often. Pt mild dry cough and rib pain on both sides and orthopnea.    Followup dyspnea  - She does have a having blood D-dimer test which was normal and pulmonary function test which is depicted below that shows obstruction with fairly normal flow-volume loops. She now presents with a husband. Her father is not with her. She describes that since her last visit she's actually feeling better. She says this is part of  normal health cycle where she can cycle between extremely deconditioned functional status with large shortness of breath and cough using a wheelchair and then the current state which is actually better and is able to walk with a cane. Some weeks ago my colleague reviewed her pulmonary function tests and called and Symbicort base and presumptive diagnosis of asthma which is held off taking this because in the past trial of Symbicort for a few weeks did not help her and when she came of Symbicort she did not feel any worse. She is open to taking Symbicort if I recommended that. Of note, she reports having had allergy  evaluation or 5 years ago th details of which he does not know  Pulmonary function test 05/05/2014 shows FEV1 1.06/35%. Ratio 70 and consistent with obstruction. Postbronchodilator FEV1 is 1.6 L showing significant broncho-dilator response. Total lung capacity is 3.9 cm/70%.  Her spirometry today in the office it is restriction of the forced vital capacity of 2.1 L or 49% predicted FEV1 of and a ratio of over 90%   OV 09/08/2014  Chief Complaint  Patient presents with  . Follow-up    Pt states she hasn't need to use her albuterol hfa very often. Pt c/o chest pain with inspiration, DOE and mild dry cough at night.     Followup cardiopulmonary stress test done in 08/18/2014. Indications dyspnea.  - Cardiopulmonary stress test to submaximal effort but only barely. The performance capacity VO2 max was significantly reduced along with anaerobic threshold. Assuming she gave submaximal effort this suggests significant physiologic limitation as a cause of her dyspnea. She was reaching ventilatory limitation at 63%. In combination with this her heart rate was was reduced and she had a restrictive spirometry. This suggests that she did not reach expected physiologic normal heart rate response with exercise even though at baseline she has tachycardia. Her dead space ventilation was increased. All this taken together suggest neuromuscular limitation. Alternative differential diagnosis is diastolic dysfunction and physiologic deconditioning  - I inquired into her neurologic history. She has a neurologist Dr. Lynnell Grain outside  [facility]. She was briefly seen at [facility]. However, she says that even though there was some positive results no specific diagnosis has been given. Multiple sclerosis was entertained. She prefers that her care is now consolidated  [facility]. She is very open to the idea of a local neurologic referral  Review of Systems  Constitutional: Negative for  fever and unexpected weight change.  HENT: Negative for congestion, dental problem, ear pain, nosebleeds, postnasal drip, rhinorrhea, sinus pressure, sneezing, sore throat and trouble swallowing.   Eyes: Negative for redness and itching.  Respiratory: Positive for cough and shortness of breath. Negative for chest tightness and wheezing.   Cardiovascular: Positive for chest pain. Negative for palpitations and leg swelling.  Gastrointestinal: Negative for nausea and vomiting.  Genitourinary: Negative for dysuria.  Musculoskeletal: Negative for joint swelling.  Skin: Negative for rash.  Neurological: Negative for headaches.  Hematological: Does not bruise/bleed easily.  Psychiatric/Behavioral: Negative for dysphoric mood. The patient is not nervous/anxious.        Objective:   Physical Exam  Filed Vitals:   09/08/14 1355  BP: 124/90  Pulse: 100  Height: 5\' 8"  (1.727 m)  Weight: 200 lb (90.719 kg)  SpO2: 98%    Discussion only visit     Assessment & Plan:  #Dyspnea  = CPST bike test 08/18/14 suggests neuromuscular problems, physical deconditoning or diastolic dysfunction as likely  causes of shortness of breath  Plan  - do echo  - refer to DR Celine Mans - neuro-muscular specialist   [place]  - return to see me in 6 months to reviiew   > 50% of this > 25 min visit spent in face to face counseling (15 min visit converted to 25 min)

## 2014-09-09 LAB — ACETAZOLAMIDE, (DIAMOX), S/P: Acetazolamide: 1.4 ug/ml — ABNORMAL LOW

## 2014-09-14 ENCOUNTER — Other Ambulatory Visit: Payer: Self-pay | Admitting: Nurse Practitioner

## 2014-09-14 DIAGNOSIS — K5909 Other constipation: Secondary | ICD-10-CM

## 2014-09-14 MED ORDER — PEG-KCL-NACL-NASULF-NA ASC-C 100 G PO SOLR: 1.0000 | Freq: Once | ORAL | Status: DC

## 2014-09-15 ENCOUNTER — Other Ambulatory Visit (HOSPITAL_COMMUNITY): Payer: Self-pay | Admitting: Internal Medicine

## 2014-09-15 ENCOUNTER — Ambulatory Visit (HOSPITAL_COMMUNITY)
Admission: RE | Admit: 2014-09-15 | Discharge: 2014-09-15 | Disposition: A | Discharge status: Home / Self Care | Payer: 59 | Source: Ambulatory Visit | Attending: Family Medicine | Admitting: Family Medicine

## 2014-09-15 DIAGNOSIS — R0609 Other forms of dyspnea: Secondary | ICD-10-CM

## 2014-09-15 DIAGNOSIS — R0600 Dyspnea, unspecified: Secondary | ICD-10-CM | POA: Diagnosis not present

## 2014-09-15 NOTE — Progress Notes (Signed)
  Echocardiogram 2D Echocardiogram has been performed.  Sarra Rachels FRANCES 09/15/2014, 12:24 PM

## 2014-09-18 ENCOUNTER — Other Ambulatory Visit: Payer: Self-pay | Admitting: *Deleted

## 2014-09-18 MED ORDER — AMITRIPTYLINE HCL 50 MG PO TABS: 50.0000 mg | ORAL_TABLET | Freq: Every day | ORAL | Status: DC

## 2014-09-18 NOTE — Telephone Encounter (Signed)
rec'd fax, sent electronically

## 2014-09-19 ENCOUNTER — Other Ambulatory Visit: Payer: Self-pay | Admitting: Family Medicine

## 2014-09-21 ENCOUNTER — Other Ambulatory Visit: Payer: Self-pay | Admitting: Nurse Practitioner

## 2014-09-22 ENCOUNTER — Ambulatory Visit: Payer: Medicare Other | Admitting: Physical Medicine & Rehabilitation

## 2014-10-20 ENCOUNTER — Ambulatory Visit: Payer: 59 | Admitting: Physical Medicine & Rehabilitation

## 2014-10-25 ENCOUNTER — Encounter: Payer: Self-pay | Admitting: Physical Medicine & Rehabilitation

## 2014-10-25 ENCOUNTER — Other Ambulatory Visit: Payer: Self-pay | Admitting: Physical Medicine & Rehabilitation

## 2014-10-25 ENCOUNTER — Encounter: Payer: 59 | Attending: Physical Medicine & Rehabilitation | Admitting: Physical Medicine & Rehabilitation

## 2014-10-25 VITALS — BP 140/80 | HR 110 | Resp 14 | Ht 68.0 in | Wt 206.0 lb

## 2014-10-25 DIAGNOSIS — M545 Low back pain: Secondary | ICD-10-CM

## 2014-10-25 DIAGNOSIS — Z79899 Other long term (current) drug therapy: Secondary | ICD-10-CM

## 2014-10-25 DIAGNOSIS — D8989 Other specified disorders involving the immune mechanism, not elsewhere classified: Secondary | ICD-10-CM

## 2014-10-25 DIAGNOSIS — R569 Unspecified convulsions: Secondary | ICD-10-CM

## 2014-10-25 DIAGNOSIS — M6249 Contracture of muscle, multiple sites: Secondary | ICD-10-CM | POA: Diagnosis not present

## 2014-10-25 DIAGNOSIS — L930 Discoid lupus erythematosus: Secondary | ICD-10-CM | POA: Diagnosis not present

## 2014-10-25 DIAGNOSIS — Z5181 Encounter for therapeutic drug level monitoring: Secondary | ICD-10-CM

## 2014-10-25 DIAGNOSIS — M62838 Other muscle spasm: Secondary | ICD-10-CM

## 2014-10-25 DIAGNOSIS — M797 Fibromyalgia: Secondary | ICD-10-CM

## 2014-10-25 DIAGNOSIS — M329 Systemic lupus erythematosus, unspecified: Secondary | ICD-10-CM | POA: Diagnosis not present

## 2014-10-25 MED ORDER — PREDNISONE 5 MG PO TABS: 5.0000 mg | ORAL_TABLET | Freq: Every day | ORAL | Status: DC

## 2014-10-25 MED ORDER — OXYCODONE-ACETAMINOPHEN 7.5-325 MG PO TABS: ORAL_TABLET | ORAL | Status: DC

## 2014-10-25 MED ORDER — LIDOCAINE 5 % EX PTCH: 3.0000 | MEDICATED_PATCH | CUTANEOUS | Status: DC

## 2014-10-25 NOTE — Addendum Note (Signed)
Addended by: Doreene ElandSHUMAKER, Lysandra Loughmiller W on: 10/25/2014 01:34 PM   Modules accepted: Orders

## 2014-10-25 NOTE — Progress Notes (Signed)
Subjective:    Patient ID: Autumn Davis, female    DOB: 09-04-1986, 28 y.o.   MRN: [id number]  HPI   Peg is back regarding her chronic pain syndrome. She feels that her ribs and left leg/foot have been hurting more, especially at night. The left foot often feels as if it's burning or "on fire."  Overall, however, she feels that the prednisone has helped her general pain levels. She is walking with her cane only now, and sometimes she doesn't need that. We have maintained the 5mg  prednisone for now pending rheumatology input. She was referred to rheum locally but apparently they would not take her case. She sees a Nutritional therapist tomorrow.   She has put on some weight due to the prednisone but is happy that her pain is less and she's able to do more. She is trying to be more careful about what she eats. Mood has been good.       Pain Inventory Average Pain 8 Pain Right Now 8 My pain is constant, sharp, burning, dull, stabbing, tingling and aching  In the last 24 hours, has pain interfered with the following? General activity 8 Relation with others 8 Enjoyment of life 8 What TIME of day is your pain at its worst? evening and night Sleep (in general) Poor  Pain is worse with: walking, bending, sitting and standing Pain improves with: rest, heat/ice, medication and TENS Relief from Meds: 3  Mobility walk without assistance walk with assistance ability to climb steps?  no do you drive?  yes  Function disabled: date disabled .  Neuro/Psych bladder control problems weakness numbness tremor tingling trouble walking spasms dizziness  Prior Studies Any changes since last visit?  no  Physicians involved in your care Any changes since last visit?  no   Family History  Problem Relation Age of Onset  . Thyroid disease Mother   . Cancer Maternal Grandmother     lung  . Cancer Paternal Grandmother     colon/pancreatiec/lymphoma   History    Social History  . Marital Status: Married    Spouse Name: N/A    Number of Children: N/A  . Years of Education: N/A   Occupational History  . disabled    Social History Main Topics  . Smoking status: Never Smoker   . Smokeless tobacco: Never Used  . Alcohol Use: No  . Drug Use: No  . Sexual Activity: None   Other Topics Concern  . None   Social History Narrative   Past Surgical History  Procedure Laterality Date  . Knee surgery  2002/2003    bil  . Rectal surgery  correction of prolapse    2010   Past Medical History  Diagnosis Date  . GERD (gastroesophageal reflux disease)   . Hyperlipidemia   . Seizures   . Thyroid disease   . Bronchitis, chronic/intermittent 01/22/2012  . Depression 01/22/2012  . IBS (irritable bowel syndrome) 01/22/2012  . Neuromuscular disorder   . SOB (shortness of breath)    BP 140/80 mmHg  Pulse 110  Resp 14  Ht 5\' 8"  (1.727 m)  Wt 206 lb (93.441 kg)  BMI 31.33 kg/m2  SpO2 99%  Opioid Risk Score:   Fall Risk Score: Low Fall Risk (0-5 points)  Review of Systems  HENT: Negative.   Eyes: Negative.   Respiratory: Negative.   Cardiovascular: Negative.   Gastrointestinal: Negative.   Endocrine: Negative.   Genitourinary:  Bladder control problems   Musculoskeletal: Positive for myalgias and back pain.  Allergic/Immunologic: Negative.   Neurological: Positive for dizziness, weakness and numbness.       Tingling, trouble walking, spasms  Hematological: Negative.   Psychiatric/Behavioral: Negative.        Objective:   Physical Exam  General: Alert and oriented x 3, No apparent distress. Has gained some weight. HEENT: Head is normocephalic, atraumatic, PERRLA, EOMI, sclera anicteric, oral mucosa pink and moist, dentition intact, ext ear canals clear,  Neck: Supple without JVD or lymphadenopathy  Heart: Reg rate and rhythm. No murmurs rubs or gallops  Chest: CTA bilaterally without wheezes, rales, or rhonchi; no distress   Abdomen: Soft, non-tender, non-distended, bowel sounds positive.  Extremities: No clubbing, cyanosis, or edema. Pulses are 2+  Skin: Clean and intact without signs of breakdown. She had a malar rash over the neck.  Neuro: Pt displays generally intact insight and awareness. CN exam appears intact without focal abnormalities today. She has good vocal quality, cough. Vision intact. Strength is grossly 4/5 in the upper limbs with improved ROM noted. Sensation is decreased below the waist bilaterally, distal more than proximal to PP/LT. She has decreased proprioception in both legs as well. Muscle tightness/tone appears decreased today. DTR's remain today in general at 1+. She uses a cane for balance---overall gait much improved today--very steady.  Musculoskeletal: She has generalized joint pain but now swelling or gross abnormalities. She has a lot of tenderness in the right lower rib cage/axillary line. Posture seems a little better when sitting.  Psych: appears more alert. Pleasant and appropriate.  Assessment & Plan:   1. Chronic seizure disorder---? myoclonic jerks/myotonia/ or some other abnormal muscle movement as opposed to seizures? ?MS  2. Chronic muscle spasms, weakness with associated pain disorder---?autoimmune disorder, ?lupus-like syndrome.  3. Chronic dysphagia  4. Right low back/flank pain  5. Likely fibromyalgia in the setting of whatever autoimmune syndrome is taking place  6. Anxiety with depression  7. Pulmonary function abnormalities., ?CO2 retention? ---recent normal ECHO from 09/15/14  Plan:  1. Maintain elavil at 50mg  qhs  2. Maintain baclofen to 20mg  for muscle spasms. Consider klonopin trial in the future.  3. Follow up with neurology regarding the management of her seizures and presentation. Marland Kitchen MTX trial? -needs rheum re-eval to help put this syndrome together. 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  4. Continue meloxicam 15mg  daily  5. Oxycodone 7.5mg  fr breakthrough pain #120 --do not want to increase further at this time. 6. Maintain gabapentin to 300mg  tid and 600mg  qhs for seizures, spasms, neuro pain  7. Add lidoderm patches three per day to ribs/left foot. May use 3 per day 8. Follow up with me or NP in about one month. 30 minutes of face to face patient care time were spent during this visit. All questions were encouraged and answered.

## 2014-10-25 NOTE — Patient Instructions (Signed)
CONTINUE TO MAINTAIN YOUR EXERCISE AND ACTIVITY LEVELS

## 2014-10-26 ENCOUNTER — Ambulatory Visit (INDEPENDENT_AMBULATORY_CARE_PROVIDER_SITE_OTHER): Payer: 59 | Admitting: Neurology

## 2014-10-26 ENCOUNTER — Other Ambulatory Visit: Payer: Self-pay | Admitting: Neurology

## 2014-10-26 ENCOUNTER — Encounter: Payer: Self-pay | Admitting: Neurology

## 2014-10-26 VITALS — BP 118/70 | HR 110 | Ht 69.0 in | Wt 207.7 lb

## 2014-10-26 DIAGNOSIS — R531 Weakness: Secondary | ICD-10-CM

## 2014-10-26 DIAGNOSIS — R569 Unspecified convulsions: Secondary | ICD-10-CM

## 2014-10-26 DIAGNOSIS — R202 Paresthesia of skin: Secondary | ICD-10-CM

## 2014-10-26 DIAGNOSIS — R0600 Dyspnea, unspecified: Secondary | ICD-10-CM

## 2014-10-26 DIAGNOSIS — M791 Myalgia, unspecified site: Secondary | ICD-10-CM

## 2014-10-26 DIAGNOSIS — F445 Conversion disorder with seizures or convulsions: Secondary | ICD-10-CM

## 2014-10-26 LAB — CK: Total CK: 100 U/L (ref 7–177)

## 2014-10-26 NOTE — Patient Instructions (Addendum)
1.  EMG of the left arm and leg 2.  Check blood work 3.  Return to clinic as needed

## 2014-10-26 NOTE — Progress Notes (Signed)
[facility] Neurology [facility] Clinic Note - Initial Visit   Date: 10/26/2014  Autumn Davis MRN: [id number] DOB: 1985/12/24   Dear Dr. Marchelle Gearing:    Thank you for your kind referral of Autumn Davis for consultation of shortness of breath and weakness. Although her history is well known to you, please allow [place] to reiterate it for the purpose of our medical record. The patient was accompanied to the clinic by husband who also provides collateral information.     History of Present Illness: Autumn Davis is a 28 y.o. right-handed Caucasian female with multiple medical issues of unclear etiology referred for neuromuscular evaluation for dyspnea and weakness.    She was in her usual state of health in 2012.  In the summer of 2012, she had dizzy spells which were concerning for seizures and had multiple evaluations with suggested non-epileptic spells. Because of her history of migraines, she was put on depakote.   She got married in December 2012. In January 2013, she started having "convulsive seizures" and numbness/tingling of her "brain, face, hands, legs" worse on the left.  She also developed rib pain over the right chest.   She was then referred to an epilepsy specialist Dr Logan Bores in [place]. She had 4-day EMU stay at [facility] and had no evidence of EEG seizure. She had one episode of eye twitching while admitted, and she was diagnosed with pseudoseizures and severe anxiety. She was referred to a therapist Dr. Juanita Craver ([facility] in [place]), who she saw for 6 months. The therapist recommended psychiatrist Dr. Skeet Latch, who put her on 5-6 different medications over the course of his involvement with her care. The medications included 1000mg  depokate, klonopin, buspar, and zoloft. She has also been on cymbalta and effexor as a teenager. Dr. Juanita Craver did not think her symptoms were solely related to anxiety, and Dr Melida Quitter recommended  neuropsychiatrist Dr. Ave Filter, who thought she was overmedicated and tapered her from 88 meds/day to what she currently takes.  Her last epilepsy evaluation was at [facility] where she had 48-hr EEG which captured 3 spells without epileptiform activity, suggestive of psychogenic non-epileptic spells.  Symptoms continued for over a year and worsened. In 2014, she went to the hospital because she was unable to walk or move.  Her husband took her to the hospital.  She had had extensive work-up including imaging of the brain, cervical spine, and thoracic spine which was normal.  Serology testing including CK,ESR, ANA, RPR, RSH, vitamin B12, HIV, GAD-antibody, and VGKC antibody was also normal.  She was discharged home with wheelchair and home PT/OT.  It took her 45-months to recover where she was able to walk with a walker, but then she had another spell of generalized pain and weakness. EMG performed in April 2014 was reportedly normal.  She has generalized pain all over her arm and legs, described as if her muscles are being pulled away form her bones.  She has severe burning pain of her feet and has been using a bilateral ankle braces which she uses at night time for the past 66-months.  She used to fall a lot.She endorses problems with talking and has been to speech therapy.   She had not had a "relapse" since June 2015 which has been her longest period.  She attributes this to being on prednisone which was started for possible autoimmune-mediated disorder ?lupus (positive ANA 1:80 speckled).  She was also evaluated for fibromyalgia.  She  [facility] Clinic Note - Initial Visit   Date: 10/26/2014  Autumn Davis MRN: [id number] DOB: [birth year]   Dear Dr. Chase Caller:    Thank you for your kind referral of Autumn Davis for consultation of shortness of breath and weakness. Although her history is well known to you, please allow [place] to reiterate it for the purpose of our medical record. The patient was accompanied to the clinic by husband who also provides collateral information.     History of Present Illness: Autumn Davis is a 28 y.o. right-handed Caucasian female with multiple medical issues of unclear etiology referred for neuromuscular evaluation for dyspnea and weakness.    She was in her usual state of health in 2012.  In the summer of 2012, she had dizzy spells which were concerning for seizures and had multiple evaluations with suggested non-epileptic spells. Because of her history of migraines, she was put on depakote.   She got married in December 2012. In January 2013, she started having "convulsive seizures" and numbness/tingling of her "brain, face, hands, legs" worse on the left.  She also developed rib pain over the right chest.   She was then referred to an epilepsy specialist Dr Amalia Hailey in [place]. She had 4-day EMU stay at [facility] and had no evidence of EEG seizure. She had one episode of eye twitching while admitted, and she was diagnosed with pseudoseizures and severe anxiety. She was referred to a therapist Dr. Veleta Miners ([facility] in [place]), who she saw for 6 months. The therapist recommended psychiatrist Dr. Galen Manila, who put her on 5-6 different medications over the course of his involvement with her care. The medications included 1065m depokate, klonopin, buspar, and zoloft. She has also been on cymbalta and effexor as a teenager. Dr. GVeleta Minersdid not think her symptoms were solely related to anxiety, and Dr MAnabel Benerecommended  neuropsychiatrist Dr. CTamera Punt who thought she was overmedicated and tapered her from 121meds/day to what she currently takes.  [facility] which captured 3 spells without epileptiform activity, suggestive of psychogenic non-epileptic spells.  Symptoms continued for over a year and worsened. In 2014, she went to the hospital because she was unable to walk or move.  Her husband took her to the hospital.  She had had extensive work-up including imaging of the brain, cervical spine, and thoracic spine which was normal.  Serology testing including CK,ESR, ANA, RPR, RSH, vitamin B12, HIV, GAD-antibody, and VGKC antibody was also normal.  She was discharged home with wheelchair and home PT/OT.  It took her 637-monthto recover where she was able to walk with a walker, but then she had another spell of generalized pain and weakness. EMG performed in April 2014 was reportedly normal.  She has generalized pain all over her arm and legs, described as if her muscles are being pulled away form her bones.  She has severe burning pain of her feet and has been using a bilateral ankle braces which she uses at night time for the past 9-26-month She used to fall a lot.She endorses problems with talking and has been to speech therapy.   She had not had a "relapse" since June 2015 which has been her longest period.  She attributes this to being on prednisone which was started for possible autoimmune-mediated disorder ?lupus (positive ANA 1:80 speckled).  She was also evaluated for fibromyalgia.  She  docusate sodium (COLACE) 100 MG capsule Take 100 mg by mouth 2 (two) times daily. PRN    . EPINEPHrine (EPI-PEN) 0.3 mg/0.3 mL SOAJ injection Inject 0.3 mg into the muscle as needed.    . gabapentin (NEURONTIN) 300 MG capsule Take 1 capsule (300 mg total) by mouth as directed. 1 capsule three x daily and two capsules at bedtime each day 150 capsule 3  . ipratropium-albuterol (DUONEB) 0.5-2.5 (3) MG/3ML SOLN Take 3 mLs by nebulization every 4 (four) hours as needed. 360 mL 4  . L-Methylfolate (DEPLIN) 15 MG TABS Take by mouth.    . lidocaine (LIDODERM) 5 % Place 3 patches onto the skin daily. Remove & Discard patch within 12 hours or as directed by MD 90 patch 4  . medroxyPROGESTERone (DEPO-PROVERA) 150 MG/ML injection Inject 150 mg into the muscle as directed. Every 2 months    . meloxicam (MOBIC) 15 MG tablet Take 1 tablet (15 mg total) by  mouth daily. 30 tablet 5  . montelukast (SINGULAIR) 10 MG tablet TAKE ONE (1) TABLET AT BEDTIME 30 tablet 3  . Omega 3 1000 MG CAPS Take 1 capsule by mouth daily.    Marland Kitchen omeprazole (PRILOSEC) 40 MG capsule Take 1 capsule (40 mg total) by mouth daily. 30 capsule 3  . oxyCODONE-acetaminophen (PERCOCET) 7.5-325 MG per tablet One tablet every 6 hours as needed. No More Than 4 a day 120 tablet 0  . peg 3350 powder (MOVIPREP) 100 G SOLR Take 1 kit (200 g total) by mouth once. 1 each 0  . predniSONE (DELTASONE) 5 MG tablet Take 1 tablet (5 mg total) by mouth daily with breakfast. 30 tablet 1  . promethazine (PHENERGAN) 12.5 MG tablet Take 1 tablet (12.5 mg total) by mouth every 8 (eight) hours as needed for nausea or vomiting. 30 tablet 0  . vitamin E (VITAMIN E) 400 UNIT capsule Take 400 Units by mouth daily.     No current facility-administered medications on file prior to visit.    Allergies:  Allergies  Allergen Reactions  . Compazine Other (See Comments)    hallucinations  . Prochlorperazine Other (See Comments)    Pt states it makes her feel like things are crawling on her  . Tomato Rash    Family History: Family History  Problem Relation Age of Onset  . Thyroid disease Mother   . Cancer Maternal Grandmother     lung  . Cancer Paternal Grandmother     colon/pancreatiec/lymphoma    Social History: History   Social History  . Marital Status: Married    Spouse Name: N/A    Number of Children: N/A  . Years of Education: N/A   Occupational History  . disabled    Social History Main Topics  . Smoking status: Never Smoker   . Smokeless tobacco: Never Used  . Alcohol Use: No  . Drug Use: No  . Sexual Activity: Not on file   Other Topics Concern  . Not on file   Social History Narrative    Review of Systems:  CONSTITUTIONAL: No fevers, chills, night sweats, or weight loss.   EYES: No visual changes or eye pain ENT: No hearing changes.  No history of nose bleeds.     RESPIRATORY: +cough, wheezing +shortness of breath.   CARDIOVASCULAR: + chest pain, and palpitations.   GI: Negative for abdominal discomfort, blood in stools or black stools.  No recent change in bowel habits.   GU:  No history of incontinence.  [facility] Clinic Note - Initial Visit   Date: 10/26/2014  Autumn Davis MRN: [id number] DOB: [birth year]   Dear Dr. Chase Caller:    Thank you for your kind referral of Autumn Davis for consultation of shortness of breath and weakness. Although her history is well known to you, please allow [place] to reiterate it for the purpose of our medical record. The patient was accompanied to the clinic by husband who also provides collateral information.     History of Present Illness: Autumn Davis is a 28 y.o. right-handed Caucasian female with multiple medical issues of unclear etiology referred for neuromuscular evaluation for dyspnea and weakness.    She was in her usual state of health in 2012.  In the summer of 2012, she had dizzy spells which were concerning for seizures and had multiple evaluations with suggested non-epileptic spells. Because of her history of migraines, she was put on depakote.   She got married in December 2012. In January 2013, she started having "convulsive seizures" and numbness/tingling of her "brain, face, hands, legs" worse on the left.  She also developed rib pain over the right chest.   She was then referred to an epilepsy specialist Dr Amalia Hailey in [place]. She had 4-day EMU stay at [facility] and had no evidence of EEG seizure. She had one episode of eye twitching while admitted, and she was diagnosed with pseudoseizures and severe anxiety. She was referred to a therapist Dr. Veleta Miners ([facility] in [place]), who she saw for 6 months. The therapist recommended psychiatrist Dr. Galen Manila, who put her on 5-6 different medications over the course of his involvement with her care. The medications included 1065m depokate, klonopin, buspar, and zoloft. She has also been on cymbalta and effexor as a teenager. Dr. GVeleta Minersdid not think her symptoms were solely related to anxiety, and Dr MAnabel Benerecommended  neuropsychiatrist Dr. CTamera Punt who thought she was overmedicated and tapered her from 121meds/day to what she currently takes.  [facility] which captured 3 spells without epileptiform activity, suggestive of psychogenic non-epileptic spells.  Symptoms continued for over a year and worsened. In 2014, she went to the hospital because she was unable to walk or move.  Her husband took her to the hospital.  She had had extensive work-up including imaging of the brain, cervical spine, and thoracic spine which was normal.  Serology testing including CK,ESR, ANA, RPR, RSH, vitamin B12, HIV, GAD-antibody, and VGKC antibody was also normal.  She was discharged home with wheelchair and home PT/OT.  It took her 637-monthto recover where she was able to walk with a walker, but then she had another spell of generalized pain and weakness. EMG performed in April 2014 was reportedly normal.  She has generalized pain all over her arm and legs, described as if her muscles are being pulled away form her bones.  She has severe burning pain of her feet and has been using a bilateral ankle braces which she uses at night time for the past 9-26-month She used to fall a lot.She endorses problems with talking and has been to speech therapy.   She had not had a "relapse" since June 2015 which has been her longest period.  She attributes this to being on prednisone which was started for possible autoimmune-mediated disorder ?lupus (positive ANA 1:80 speckled).  She was also evaluated for fibromyalgia.  She  Left brachioradialis 2+  brachioradialis 2+  biceps 2+  biceps 2+  triceps 2+  triceps 2+  patellar 2+  patellar 2+  ankle jerk 2+  ankle jerk 2+  Hoffman no  Hoffman no  plantar response down  plantar response down   SENSORY:  Normal and symmetric perception of light touch, pinprick, vibration, and proprioception.  Romberg's sign absent.   COORDINATION/GAIT: Normal finger-to- nose-finger.  Intact rapid alternating movements bilaterally.  Gait is wide-based, slow, with non-physiologic quality when tested unassisted.  She is able to stand on heels and toes.  She is unable to tandem walk.  Gait appears more stable with cane.   IMPRESSION: Autumn Davis is a 28 year-old female referred to me for neuromuscular evaluation for her shortness of  breath and generalized weakness. She has a complicated medical history which started in 2012 with pseudoseizures and has since developed generalized pain, paresthesias, and weakness. She seems to have relapses of severe weakness and pain, without clear underlying etiology. Her workup has been extensive and involved multiple specialties, including neurological evaluation at [facility], [facility], and [facility]. She has had extended epilepsy monitoring which captured nonepileptic spells, without any true seizure activity. She was then referred to neuropsychiatry for conversion disorder and has since been following with Dr. Ave Filter who has been managing her ongoing neurological issues.  Her prior workup has included MRI brain, MRI cervical spine, MRI thoracic spine, multiple EEGs, serology testing (notable for mildly elevated ANA, repeat was negative), EMG, and CSF analysis which has essentially been nondiagnostic. Unfortunately, I do not have the results of her EMG, CSF testing, or her most recent MRI of the brain to review personally.    I do not find any focal weakness on her exam. There is some nonphysiologic features to her exam, making it difficult to accurately assess the motor strength of the distal hand muscles. Her CKs have been evaluated in the past her and have always been within normal limits.  To further evaluate her left hemisensory paresthesias, I would like to obtain a nerve conduction study. Additionally, because of her fluctuating weakness and shortness of breath, EMG with repetitive nerve stimulation will be performed.  Although my suspicion for myasthenia gravis is low, for completeness, acetylcholine receptor antibodies will be checked.   PLAN/RECOMMENDATIONS:  1.  EMG of the left side with RNS 2.  Check MG panel 3.  Requested that patient provide records of her prior neurological work-up  4.  Return to clinic after above testing.   The  duration of this appointment visit was 65 minutes of face-to-face time with the patient.  Greater than 50% of this time was spent in counseling, explanation of diagnosis, planning of further management, and coordination of care.   Thank you for allowing me to participate in patient's care.  If I can answer any additional questions, I would be pleased to do so.    Sincerely,    Krishiv Sandler K. Allena Katz, DO

## 2014-10-27 LAB — PMP ALCOHOL METABOLITE (ETG): ETGU: NEGATIVE ng/mL

## 2014-10-31 LAB — OPIATES/OPIOIDS (LC/MS-MS)
Codeine Urine: NEGATIVE ng/mL (ref <50)
Hydrocodone: NEGATIVE ng/mL (ref <50)
Hydromorphone: NEGATIVE ng/mL (ref <50)
MORPHINE: NEGATIVE ng/mL (ref <50)
NORHYDROCODONE, UR: NEGATIVE ng/mL (ref <50)
Noroxycodone, Ur: 3169 ng/mL (ref <50)
Oxycodone, ur: 1611 ng/mL (ref <50)
Oxymorphone: 1017 ng/mL (ref <50)

## 2014-10-31 LAB — OXYCODONE, URINE (LC/MS-MS)
Noroxycodone, Ur: 3169 ng/mL (ref <50)
OXYCODONE, UR: 1611 ng/mL (ref <50)
OXYMORPHONE, URINE: 1017 ng/mL (ref <50)

## 2014-11-01 LAB — PRESCRIPTION MONITORING PROFILE (SOLSTAS)
AMPHETAMINE/METH: NEGATIVE ng/mL
BARBITURATE SCREEN, URINE: NEGATIVE ng/mL
Benzodiazepine Screen, Urine: NEGATIVE ng/mL
Buprenorphine, Urine: NEGATIVE ng/mL
COCAINE METABOLITES: NEGATIVE ng/mL
CREATININE, URINE: 187.21 mg/dL (ref >20.0)
Cannabinoid Scrn, Ur: NEGATIVE ng/mL
Carisoprodol, Urine: NEGATIVE ng/mL
Fentanyl, Ur: NEGATIVE ng/mL
MDMA URINE: NEGATIVE ng/mL
METHADONE SCREEN, URINE: NEGATIVE ng/mL
Meperidine, Ur: NEGATIVE ng/mL
Nitrites, Initial: NEGATIVE ug/mL
Propoxyphene: NEGATIVE ng/mL
TAPENTADOLUR: NEGATIVE ng/mL
TRAMADOL UR: NEGATIVE ng/mL
Zolpidem, Urine: NEGATIVE ng/mL
pH, Initial: 5.3 pH (ref 4.5–8.9)

## 2014-11-01 NOTE — Progress Notes (Signed)
Urine drug screen for this encounter is consistent 

## 2014-11-03 ENCOUNTER — Other Ambulatory Visit: Payer: Self-pay | Admitting: Nurse Practitioner

## 2014-11-03 DIAGNOSIS — R0600 Dyspnea, unspecified: Secondary | ICD-10-CM | POA: Diagnosis not present

## 2014-11-03 LAB — MYASTHENIA GRAVIS PANEL 2
Acetylcholine Rec Mod Ab: 10 %
Aceytlcholine Rec Bloc Ab: <15 % of inhibition (ref <15)

## 2014-11-03 MED ORDER — BENZONATATE 100 MG PO CAPS: 100.0000 mg | ORAL_CAPSULE | Freq: Two times a day (BID) | ORAL | Status: DC | PRN

## 2014-11-03 MED ORDER — AZITHROMYCIN 250 MG PO TABS: ORAL_TABLET | ORAL | Status: DC

## 2014-11-21 ENCOUNTER — Encounter: Payer: 59 | Attending: Physical Medicine & Rehabilitation | Admitting: Registered Nurse

## 2014-11-21 ENCOUNTER — Encounter: Payer: Self-pay | Admitting: Registered Nurse

## 2014-11-21 VITALS — BP 143/92 | HR 96 | Resp 14

## 2014-11-21 DIAGNOSIS — M6249 Contracture of muscle, multiple sites: Secondary | ICD-10-CM

## 2014-11-21 DIAGNOSIS — M797 Fibromyalgia: Secondary | ICD-10-CM | POA: Diagnosis not present

## 2014-11-21 DIAGNOSIS — Z79899 Other long term (current) drug therapy: Secondary | ICD-10-CM

## 2014-11-21 DIAGNOSIS — M791 Myalgia, unspecified site: Secondary | ICD-10-CM

## 2014-11-21 DIAGNOSIS — M329 Systemic lupus erythematosus, unspecified: Secondary | ICD-10-CM | POA: Diagnosis not present

## 2014-11-21 DIAGNOSIS — L930 Discoid lupus erythematosus: Secondary | ICD-10-CM

## 2014-11-21 DIAGNOSIS — M545 Low back pain: Secondary | ICD-10-CM | POA: Diagnosis not present

## 2014-11-21 DIAGNOSIS — M62838 Other muscle spasm: Secondary | ICD-10-CM

## 2014-11-21 DIAGNOSIS — R569 Unspecified convulsions: Secondary | ICD-10-CM

## 2014-11-21 DIAGNOSIS — R0781 Pleurodynia: Secondary | ICD-10-CM

## 2014-11-21 DIAGNOSIS — Z5181 Encounter for therapeutic drug level monitoring: Secondary | ICD-10-CM

## 2014-11-21 DIAGNOSIS — R0789 Other chest pain: Secondary | ICD-10-CM

## 2014-11-21 DIAGNOSIS — D8989 Other specified disorders involving the immune mechanism, not elsewhere classified: Secondary | ICD-10-CM

## 2014-11-21 MED ORDER — OXYCODONE-ACETAMINOPHEN 7.5-325 MG PO TABS: ORAL_TABLET | ORAL | Status: DC

## 2014-11-21 NOTE — Progress Notes (Signed)
Subjective:    Patient ID: Autumn Davis, female    DOB: Feb 18, 1986, 29 y.o.   MRN: [id number]  HPI: [name] is a 29 year old female who returns for follow up for chronic pain and medication refill. She says her pain is in her rib cage right greater than left, left arm and left leg with numbness and tingling increased intensity of burning sensations. Her neurologist will be repeating EMG this month. She rates her pain 8. Her current exercise regime is performing stretching exercises with bands and using clay therapy for hands. She still haven't found a rheumatologist who will take her case, her PCP still assisting in this matter.    Pain Inventory Average Pain 9 Pain Right Now 8 My pain is constant, sharp, burning, dull, stabbing, tingling and aching  In the last 24 hours, has pain interfered with the following? General activity 9 Relation with others 7 Enjoyment of life 8 What TIME of day is your pain at its worst? evening, night Sleep (in general) Poor  Pain is worse with: walking, bending, sitting and standing Pain improves with: rest, heat/ice, therapy/exercise, pacing activities, medication and TENS Relief from Meds: 4  Mobility walk with assistance use a cane use a walker ability to climb steps?  no do you drive?  no use a wheelchair needs help with transfers transfers alone  Function I need assistance with the following:  dressing, toileting, meal prep, household duties and shopping  Neuro/Psych bladder control problems weakness numbness tremor tingling trouble walking spasms dizziness  Prior Studies Any changes since last visit?  no  Physicians involved in your care Any changes since last visit?  no   Family History  Problem Relation Age of Onset  . Thyroid disease Mother   . Cancer Maternal Grandmother     lung  . Cancer Paternal Grandmother     colon/pancreatiec/lymphoma   History   Social History  . Marital  Status: Married    Spouse Name: N/A    Number of Children: N/A  . Years of Education: N/A   Occupational History  . disabled    Social History Main Topics  . Smoking status: Never Smoker   . Smokeless tobacco: Never Used  . Alcohol Use: No  . Drug Use: No  . Sexual Activity: None   Other Topics Concern  . None   Social History Narrative   Past Surgical History  Procedure Laterality Date  . Knee surgery  2002/2003    bil  . Rectal surgery  correction of prolapse    2010   Past Medical History  Diagnosis Date  . GERD (gastroesophageal reflux disease)   . Hyperlipidemia   . Seizures   . Thyroid disease   . Bronchitis, chronic/intermittent 01/22/2012  . Depression 01/22/2012  . IBS (irritable bowel syndrome) 01/22/2012  . Neuromuscular disorder   . SOB (shortness of breath)    BP 143/92 mmHg  Pulse 96  Resp 14  SpO2 96%  Opioid Risk Score:   Fall Risk Score: Low Fall Risk (0-5 points) (Pt has been educated and rec'd pamphlet during previous visit) Review of Systems  Genitourinary: Positive for urgency and frequency.       Incontinence   Musculoskeletal: Positive for gait problem.  Neurological: Positive for dizziness, tremors, weakness and numbness.       Tingling Spasms   All other systems reviewed and are negative.      Objective:   Physical Exam  Constitutional: She is oriented to person, place, and time. She appears well-developed and well-nourished.  HENT:  Head: Normocephalic and atraumatic.  Neck: Normal range of motion. Neck supple.  Cardiovascular: Normal rate and regular rhythm.   Pulmonary/Chest: Effort normal and breath sounds normal.  Musculoskeletal:  Normal Muscle Bulk and Muscle testing reveals: Upper Extremities: Full ROM and Muscle strength 5/5 Extension produces Muscle Spasm Thoracic Paraspinal Tenderness: T-6- T-8 Lateral Axilla Tenderness Lower Extremities:Full ROM and Muscle Strength 5/5 Arises from chair with slight difficulty/  Using straight cane for support Antalgic gait  Neurological: She is alert and oriented to person, place, and time.  Skin: Skin is warm and dry.  Psychiatric: She has a normal mood and affect.  Nursing note and vitals reviewed.         Assessment & Plan:  1. Chronic seizure disorder: No seizure's. Continue Gabapentin. Neurology Following  2. Chronic muscle spasms, weakness with associated pain disorder: Continue Baclofen and MOBIC. Continue with Exercise regime. 3. Chronic dysphagia: GI Following  4. Anxiety with depression : Stable. Continue Elavil  5. Fibromyalgia/ Chronic Pain: Continue with exercise and heat Therapy.  Refilled:  oxyCODONE 7.5/325mg  one tablet every 6 hours as needed #120.    30 minutes of face to face patient care time was spent during this visit. All questions were encouraged and answered.  F/U in 1 month

## 2014-11-27 DIAGNOSIS — F411 Generalized anxiety disorder: Secondary | ICD-10-CM | POA: Diagnosis not present

## 2014-11-29 ENCOUNTER — Ambulatory Visit (INDEPENDENT_AMBULATORY_CARE_PROVIDER_SITE_OTHER): Payer: 59

## 2014-11-29 ENCOUNTER — Ambulatory Visit (INDEPENDENT_AMBULATORY_CARE_PROVIDER_SITE_OTHER): Payer: 59 | Admitting: Nurse Practitioner

## 2014-11-29 ENCOUNTER — Encounter: Payer: Self-pay | Admitting: Nurse Practitioner

## 2014-11-29 VITALS — BP 130/87 | HR 101 | Temp 97.5°F | Ht 69.0 in | Wt 208.0 lb

## 2014-11-29 DIAGNOSIS — M21372 Foot drop, left foot: Secondary | ICD-10-CM

## 2014-11-29 DIAGNOSIS — M25572 Pain in left ankle and joints of left foot: Secondary | ICD-10-CM | POA: Diagnosis not present

## 2014-11-29 NOTE — Patient Instructions (Signed)

## 2014-11-29 NOTE — Progress Notes (Signed)
Subjective:    Patient ID: Autumn Davis, female    DOB: 02-12-1986, 29 y.o.   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 She is getting ready to go on a cruise with her family and wanted to make sure that something more was  Not wrong with ankle    Review of Systems  Constitutional: Negative.   HENT: Negative.   Respiratory: Negative.   Cardiovascular: Negative.   Gastrointestinal: Negative.   Genitourinary: Negative.   Neurological: Negative.   Psychiatric/Behavioral: Negative.   All other systems reviewed and are negative.      Objective:   Physical Exam  Constitutional: She is oriented to person, place, and time. She appears well-developed and well-nourished.  Cardiovascular: Normal rate, regular rhythm and normal heart sounds.   Pulmonary/Chest: Effort normal.  Musculoskeletal:  Mild lateral edema of left ankle. Pain on flexion and eversion.  Neurological: She is alert and oriented to person, place, and time.  Skin: Skin is warm and dry.  Psychiatric: She has a normal mood and affect. Her behavior is normal. Judgment and thought content normal.   BP 130/87 mmHg  Pulse 101  Temp(Src) 97.5 F (36.4 C) (Oral)  Ht 5\' 9"  (1.753 m)  Wt 208 lb (94.348 kg)  BMI 30.70 kg/m2   Left ankle xray- no fracture-Preliminary reading by Paulene Floor, FNP  [facility]        Assessment & Plan:   1. Left ankle pain   2. Left foot drop    Elevate ankle when sitting Ankle brace if helps RTO prn  Mary-Margaret Daphine Deutscher, FNP

## 2014-12-02 ENCOUNTER — Other Ambulatory Visit: Payer: Self-pay | Admitting: Nurse Practitioner

## 2014-12-04 NOTE — Telephone Encounter (Signed)
Per protocol,next visit sched 12/2014

## 2014-12-07 ENCOUNTER — Ambulatory Visit: Payer: Medicare Other | Admitting: Nurse Practitioner

## 2014-12-14 ENCOUNTER — Ambulatory Visit (INDEPENDENT_AMBULATORY_CARE_PROVIDER_SITE_OTHER): Payer: 59 | Admitting: Neurology

## 2014-12-14 DIAGNOSIS — R531 Weakness: Secondary | ICD-10-CM

## 2014-12-14 DIAGNOSIS — R202 Paresthesia of skin: Secondary | ICD-10-CM | POA: Diagnosis not present

## 2014-12-14 DIAGNOSIS — M791 Myalgia, unspecified site: Secondary | ICD-10-CM

## 2014-12-14 NOTE — Procedures (Signed)
[facility]  [address] [place], [address]  [place], [place] [address] Tel: [phone] Fax:  [phone] Test Date:  12/14/2014  Patient: Autumn Davis DOB: 1985-12-31 Physician: Nita Sickle, DO  Sex: Female Height: 5\' 9"  Ref Phys: Nita Sickle  ID#: [id number] Temp: 34.6C Technician:    Patient Complaints: Patient is a 29 year old female here for evaluation of generalized weakness, shortness of breath, and whole-body parethesias.  NCV & EMG Findings: Extensive electrodiagnostic testing of the left upper extremity, left lower extremity, and additional repetitive nerve stimulation of the spinal accessory nerve shows:  1. Left median and sural sensory responses are within normal limits. 2. Left median and peroneal motor responses are within normal limits. 3. Normal repetitive nerve stimulation of the spinal accessory, median, and peroneal motor nerves recording at the trapezius, abductor pollicis brevis, and tibialis anterior, respectively. 4. Generalized incomplete pattern of motor unit activation and could be related to pain, reduced voluntary effort, or less likely a central disorder of motor unit control.  Motor unit configuration was normal in all the tested muscles.  Impression: This is a normal study of the left upper and lower extremities.   In particular, there is no evidence of a neuromuscular junction disorder (i.e. myasthenia gravis), generalized myopathy, or cervical/lumbosacral radiculopathy affecting the left side.   ___________________________ Nita Sickle, DO    Nerve Conduction Studies Anti Sensory Summary Table   Site NR Peak (ms) Norm Peak (ms) P-T Amp (V) Norm P-T Amp  Left Median Anti Sensory (2nd Digit)  Wrist    3.2 <3.4 85.1 >20  Left Sural Anti Sensory (Lat Mall)  Calf    3.8 <4.5 12.9 >5   Motor Summary Table   Site NR Onset (ms) Norm Onset (ms) O-P Amp (mV) Norm O-P Amp Site1 Site2 Delta-0 (ms) Dist (cm) Vel (m/s) Norm Vel (m/s)    Left Median Motor (Abd Poll Brev)  34C  Wrist    3.1 <3.9 10.4 >6 Elbow Wrist 4.6 26.0 57 >50  Elbow    7.7  10.3         Left Peroneal Motor (Ext Dig Brev)  Ankle    3.4 <5.5 5.8 >3 B Fib Ankle 8.2 0.0  >40  B Fib    11.6  5.5  Poplt B Fib 1.7 0.0  >40  Poplt    13.3  5.5         Left Peroneal TA Motor (Tib Ant)  Fib Head    2.9 <4.0 4.5 >4 Poplit Fib Head 0.5 0.0  >40  Poplit    3.4  4.5          EMG   Side Muscle Ins Act Fibs Psw Fasc Number Recrt Dur Dur. Amp Amp. Poly Poly. Comment  Left AntTibialis Nml Nml Nml Nml 1- Mod-V Nml Nml Nml Nml Nml Nml N/A  Left Gastroc Nml Nml Nml Nml 1- Mod-V Nml Nml Nml Nml Nml Nml N/A  Left Flex Dig Long Nml Nml Nml Nml 1- Mod-V Nml Nml Nml Nml Nml Nml N/A  Left RectFemoris Nml Nml Nml Nml 2- Mod-V Nml Nml Nml Nml Nml Nml N/A  Left GluteusMed Nml Nml Nml Nml 2- Mod-V Nml Nml Nml Nml Nml Nml N/A  Left 1stDorInt Nml Nml Nml Nml Nml Nml Nml Nml Nml Nml Nml Nml N/A  Left FlexPolLong Nml Nml Nml Nml Nml Nml Nml Nml Nml Nml Nml Nml N/A  Left BrachioRad Nml Nml Nml Nml 1- Mod-V Nml  Nml Nml Nml Nml Nml N/A  Left PronatorTeres Nml Nml Nml Nml Nml Nml Nml Nml Nml Nml Nml Nml N/A  Left Biceps Nml Nml Nml Nml 1- Mod-V Nml Nml Nml Nml Nml Nml N/A  Left Triceps Nml Nml Nml Nml 1- Mod-V Nml Nml Nml Nml Nml Nml N/A  Left Deltoid Nml Nml Nml Nml 1- Mod-V Nml Nml Nml Nml Nml Nml N/A   RNS   Trial # Label Amp 1 (mV)  O-P Amp 5 (mV)  O-P Amp % Dif Area 1 (mVms) Area 5 (mVms) Area % Dif Rep Rate Train Length Pause Time (min:sec) Comments  Left Abd Poll Brev  Tr 1 Baseline 10.73 10.43 -2.8 43.91 41.16 -6.3 3.00 10 00:30   Tr 2 Post Exercise 10.76 10.34 -3.9 43.00 40.83 -5.0 3.00 10 01:00   Tr 3 1 Min Post 10.64 10.28 -3.4 44.63 41.94 -6.0 3.00 10 01:00   Tr 4 2 Min Post 10.33 10.06 -2.6 43.97 41.50 -5.6 3.00 10 01:00   Tr 5 3 Min Post 10.39 10.12 -2.6 41.67 40.26 -3.4 3.00 10 00:00   Left AntTibialis  Tr 2 Baseline 4.46 4.41 -1.1 17.93 17.17 -4.3 3.00 10  00:30   Tr 3 Post Exercise 4.18 4.30 2.8 15.62 15.75 0.8 3.00 10 01:00   Tr 4 1 Min Post 4.30 4.32 0.4 16.63 15.72 -5.4 3.00 10 01:00   Tr 5 2 Min Post 4.19 4.17 -0.4 15.72 14.99 -4.6 3.00 10 01:00   Tr 6 3 Min Post 4.18 4.34 3.8 18.24 17.04 -6.6 3.00 10 00:00   Left Trapezius   Tr 1 Baseline 4.30 4.47 3.8 33.04 31.22 -5.5 3.00 10 00:30   Tr 2 Post Exercise 4.74 4.95 4.4 34.88 33.83 -3.0 3.00 10 01:00   Tr 4 1 Min Post 4.64 4.95 6.7 33.23 33.25 0.0 3.00 10 01:00   Tr 5 2 Min Post 4.63 4.98 7.5 33.00 32.93 -0.2 3.00 10 01:00   Tr 6 3 Min Post 4.71 5.06 7.6 33.93 33.89 -0.1 3.00 10 00:00        Waveforms:

## 2014-12-18 ENCOUNTER — Telehealth: Payer: Self-pay | Admitting: Neurology

## 2014-12-18 NOTE — Telephone Encounter (Signed)
Returning a call to Autumn Davis, CB# [phone] / Autumn KrasSherri S.

## 2014-12-18 NOTE — Telephone Encounter (Signed)
Returned call to patient.

## 2014-12-19 ENCOUNTER — Other Ambulatory Visit: Payer: Self-pay | Admitting: Nurse Practitioner

## 2014-12-19 ENCOUNTER — Encounter: Payer: 59 | Attending: Physical Medicine & Rehabilitation | Admitting: Registered Nurse

## 2014-12-19 ENCOUNTER — Other Ambulatory Visit: Payer: Self-pay | Admitting: Registered Nurse

## 2014-12-19 ENCOUNTER — Encounter: Payer: Self-pay | Admitting: Registered Nurse

## 2014-12-19 VITALS — BP 147/73 | HR 107 | Resp 16

## 2014-12-19 DIAGNOSIS — R569 Unspecified convulsions: Secondary | ICD-10-CM | POA: Diagnosis not present

## 2014-12-19 DIAGNOSIS — L930 Discoid lupus erythematosus: Secondary | ICD-10-CM | POA: Diagnosis not present

## 2014-12-19 DIAGNOSIS — M6249 Contracture of muscle, multiple sites: Secondary | ICD-10-CM | POA: Diagnosis not present

## 2014-12-19 DIAGNOSIS — D8989 Other specified disorders involving the immune mechanism, not elsewhere classified: Secondary | ICD-10-CM

## 2014-12-19 DIAGNOSIS — Z5181 Encounter for therapeutic drug level monitoring: Secondary | ICD-10-CM

## 2014-12-19 DIAGNOSIS — M545 Low back pain: Secondary | ICD-10-CM | POA: Insufficient documentation

## 2014-12-19 DIAGNOSIS — M62838 Other muscle spasm: Secondary | ICD-10-CM

## 2014-12-19 DIAGNOSIS — M329 Systemic lupus erythematosus, unspecified: Secondary | ICD-10-CM

## 2014-12-19 DIAGNOSIS — M797 Fibromyalgia: Secondary | ICD-10-CM | POA: Insufficient documentation

## 2014-12-19 DIAGNOSIS — R0781 Pleurodynia: Secondary | ICD-10-CM

## 2014-12-19 DIAGNOSIS — Z79899 Other long term (current) drug therapy: Secondary | ICD-10-CM

## 2014-12-19 MED ORDER — OXYCODONE-ACETAMINOPHEN 7.5-325 MG PO TABS: ORAL_TABLET | ORAL | Status: DC

## 2014-12-19 NOTE — Progress Notes (Signed)
Subjective:    Patient ID: Autumn Davis, female    DOB: [birth year], 29 y.o.   MRN: [id number]  HPI: [name] is a 29 year old female who returns for follow up for chronic pain and medication refill. She says her pain is in her rib cage on the right side and left leg numbness and tingling. She rates her pain 8. Her current exercise regime is performing stretching exercises with bands and chair exercises. She still hasn't found a rheumatologist who will take her case, her PCP still assisting in this matter.   She states two weeks ago she had ankle swelling, she showed picture on the phone. She still feels frustrated at times related to not having a definitive diagnosis and without having a Rheumatologist. All questions answered and emotional support given.  She was on a 5 day cruise last month, had a great time with family.  Pain Inventory Average Pain 9 Pain Right Now 8 My pain is intermittent, constant, sharp, burning, stabbing, tingling and aching  In the last 24 hours, has pain interfered with the following? General activity 9 Relation with others 9 Enjoyment of life 9 What TIME of day is your pain at its worst? evening and night Sleep (in general) Poor  Pain is worse with: walking, bending, sitting, standing and some activites Pain improves with: rest, heat/ice, therapy/exercise, medication and TENS Relief from Meds: 3  Mobility walk without assistance walk with assistance use a cane use a walker ability to climb steps?  no do you drive?  no  Function disabled: date disabled . I need assistance with the following:  dressing, bathing, meal prep, household duties and shopping  Neuro/Psych bladder control problems weakness numbness tremor tingling trouble walking spasms dizziness  Prior Studies Any changes since last visit?  no  Physicians involved in your care Any changes since last visit?  no   Family History  Problem Relation  Age of Onset  . Thyroid disease Mother   . Cancer Maternal Grandmother     lung  . Cancer Paternal Grandmother     colon/pancreatiec/lymphoma   History   Social History  . Marital Status: Married    Spouse Name: N/A    Number of Children: N/A  . Years of Education: N/A   Occupational History  . disabled    Social History Main Topics  . Smoking status: Never Smoker   . Smokeless tobacco: Never Used  . Alcohol Use: No  . Drug Use: No  . Sexual Activity: None   Other Topics Concern  . None   Social History Narrative   Past Surgical History  Procedure Laterality Date  . Knee surgery  2002/2003    bil  . Rectal surgery  correction of prolapse    2010   Past Medical History  Diagnosis Date  . GERD (gastroesophageal reflux disease)   . Hyperlipidemia   . Seizures   . Thyroid disease   . Bronchitis, chronic/intermittent 01/22/2012  . Depression 01/22/2012  . IBS (irritable bowel syndrome) 01/22/2012  . Neuromuscular disorder   . SOB (shortness of breath)    BP 147/73 mmHg  Pulse 107  Resp 16  SpO2 97%  Opioid Risk Score:   Fall Risk Score: Moderate Fall Risk (6-13 points) (previously educated and given handout) Review of Systems  Constitutional: Positive for chills, diaphoresis and unexpected weight change.  Cardiovascular: Positive for leg swelling.  Gastrointestinal: Positive for nausea, abdominal pain and diarrhea.  Musculoskeletal:       Spasms  Neurological: Positive for dizziness, tremors, weakness and numbness.       Tingling  All other systems reviewed and are negative.      Objective:   Physical Exam  Constitutional: She is oriented to person, place, and time. She appears well-developed and well-nourished.  HENT:  Head: Normocephalic and atraumatic.  Neck: Normal range of motion. Neck supple.  Cardiovascular: Normal rate and regular rhythm.   Pulmonary/Chest: Effort normal and breath sounds normal.  Musculoskeletal:  Normal Muscle Bulk and  Muscle Testing Reveals: Upper Extremities: Full ROM and Muscle Strength 5/5 Upper extremity Extension Produces Spasms Thoracic Paraspinal Tenderness: T-5-T-7 Lower Extremities: Full ROM and Muscle strength 5/5 Arises from chair with ease Using straight Cane   Neurological: She is alert and oriented to person, place, and time.  Skin: Skin is warm and dry.  Psychiatric: She has a normal mood and affect.  Nursing note and vitals reviewed.         Assessment & Plan:  1. Chronic seizure disorder: No seizure's. Continue Gabapentin. Neurology Following  2. Chronic muscle spasms, weakness with associated pain disorder: Continue Baclofen and MOBIC. Continue with Exercise regime. 3. Chronic dysphagia: GI Following  4. Anxiety with depression : Stable. Continue Elavil  5. Fibromyalgia/ Chronic Pain: Continue with exercise and heat Therapy.  Refilled: oxyCODONE 7.5/325mg  one tablet every 6 hours as needed #120.    30 minutes of face to face patient care time was spent during this visit. All questions were encouraged and answered.

## 2014-12-26 ENCOUNTER — Other Ambulatory Visit: Payer: Self-pay | Admitting: Obstetrics and Gynecology

## 2014-12-26 DIAGNOSIS — Z01419 Encounter for gynecological examination (general) (routine) without abnormal findings: Secondary | ICD-10-CM | POA: Diagnosis not present

## 2014-12-26 DIAGNOSIS — Z6831 Body mass index (BMI) 31.0-31.9, adult: Secondary | ICD-10-CM | POA: Diagnosis not present

## 2014-12-27 LAB — CYTOLOGY - PAP

## 2014-12-28 ENCOUNTER — Telehealth: Payer: Self-pay | Admitting: Nurse Practitioner

## 2014-12-28 ENCOUNTER — Other Ambulatory Visit: Payer: Self-pay | Admitting: *Deleted

## 2014-12-28 DIAGNOSIS — M329 Systemic lupus erythematosus, unspecified: Secondary | ICD-10-CM

## 2014-12-28 DIAGNOSIS — M62838 Other muscle spasm: Secondary | ICD-10-CM

## 2014-12-28 MED ORDER — PREDNISONE 5 MG PO TABS: 5.0000 mg | ORAL_TABLET | Freq: Every day | ORAL | Status: DC

## 2014-12-28 NOTE — Telephone Encounter (Signed)
Stp she states she woke up this morning shaking uncontrollably and her heart was pounding. She felt like she couldn't catch her breath. After doing her nebulizer she has felt better but it still hurts a little to breathe and I could hear wheezing over the phone. She has an appt with MMM tomorrow morning and she hasn't been using her nebulizer every 4 hours like she should have. Pt states she will use the nebulizer every 4 hours today and her husband is at home with her so she won't be alone before coming in to see MMM in the morning. Discussed with MMM and she agrees.

## 2014-12-29 ENCOUNTER — Encounter: Payer: Self-pay | Admitting: Nurse Practitioner

## 2014-12-29 ENCOUNTER — Ambulatory Visit (INDEPENDENT_AMBULATORY_CARE_PROVIDER_SITE_OTHER): Payer: 59 | Admitting: Nurse Practitioner

## 2014-12-29 VITALS — BP 132/86 | HR 102 | Temp 96.8°F | Ht 69.0 in | Wt 208.0 lb

## 2014-12-29 DIAGNOSIS — K589 Irritable bowel syndrome without diarrhea: Secondary | ICD-10-CM | POA: Diagnosis not present

## 2014-12-29 DIAGNOSIS — D8989 Other specified disorders involving the immune mechanism, not elsewhere classified: Secondary | ICD-10-CM

## 2014-12-29 DIAGNOSIS — E282 Polycystic ovarian syndrome: Secondary | ICD-10-CM | POA: Diagnosis not present

## 2014-12-29 DIAGNOSIS — R002 Palpitations: Secondary | ICD-10-CM

## 2014-12-29 DIAGNOSIS — E785 Hyperlipidemia, unspecified: Secondary | ICD-10-CM | POA: Diagnosis not present

## 2014-12-29 DIAGNOSIS — M797 Fibromyalgia: Secondary | ICD-10-CM | POA: Diagnosis not present

## 2014-12-29 DIAGNOSIS — J42 Unspecified chronic bronchitis: Secondary | ICD-10-CM

## 2014-12-29 DIAGNOSIS — I479 Paroxysmal tachycardia, unspecified: Secondary | ICD-10-CM | POA: Diagnosis not present

## 2014-12-29 DIAGNOSIS — L930 Discoid lupus erythematosus: Secondary | ICD-10-CM | POA: Diagnosis not present

## 2014-12-29 DIAGNOSIS — K219 Gastro-esophageal reflux disease without esophagitis: Secondary | ICD-10-CM

## 2014-12-29 DIAGNOSIS — F329 Major depressive disorder, single episode, unspecified: Secondary | ICD-10-CM | POA: Diagnosis not present

## 2014-12-29 DIAGNOSIS — F32A Depression, unspecified: Secondary | ICD-10-CM

## 2014-12-29 MED ORDER — MEDROXYPROGESTERONE ACETATE 150 MG/ML IM SUSP
150.0000 mg | Freq: Once | INTRAMUSCULAR | Status: AC
  Administered 2014-12-29: 150 mg via INTRAMUSCULAR

## 2014-12-29 MED ORDER — METOPROLOL SUCCINATE ER 25 MG PO TB24: 25.0000 mg | ORAL_TABLET | Freq: Every day | ORAL | Status: DC

## 2014-12-29 NOTE — Progress Notes (Signed)
Subjective:    Patient ID: Autumn Davis, female    DOB: 1986-09-18, 29 y.o.   MRN: [id number]  HPI Patient is here today for follow up. She has been having issues with her breathing but had stopped her albuterol- SHe did albuterol treatments every 4 hours yesterday and her breathing is much better. She says that her heart has been racing which i assume is from the albuterol. She has no new complaints today.She has been taking prednisone regularly and has gained a lot of weight.  Patient Active Problem List   Diagnosis Date Noted  . 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 Lupus (systemic lupus erythematosus) 08/29/2014  . Dyspnea 04/10/2014  . Fibromyalgia 12/14/2013  . Muscle spasticity 11/14/2013  . Thoracic back pain 11/14/2013  . Lumbago 11/14/2013  . Quadriplegia 01/10/2013  . Hyperlipidemia 08/26/2012  . IBS (irritable bowel syndrome) 01/22/2012  . Depression 01/22/2012  . Bronchitis, chronic/intermittent 01/22/2012  . Abdominal pain, generalized 01/22/2012  . Back pain, lumbosacral 01/22/2012  . GERD (gastroesophageal reflux disease)   . Seizures   . Thyroid disease     Outpatient Encounter Prescriptions as of 12/29/2014  Medication Sig  . acetaZOLAMIDE (DIAMOX) 250 MG tablet Take 250 mg by mouth 3 (three) times daily.   Marland Kitchen albuterol (PROVENTIL HFA;VENTOLIN HFA) 108 (90 BASE) MCG/ACT inhaler Inhale 2 puffs into the lungs every 6 (six) hours as needed for wheezing or shortness of breath.  Marland Kitchen amitriptyline (ELAVIL) 50 MG tablet Take 1 tablet (50 mg total) by mouth at bedtime.  Marland Kitchen atorvastatin (LIPITOR) 40 MG tablet TAKE ONE (1) TABLET EACH DAY  . baclofen (LIORESAL) 20 MG tablet TAKE ONE TABLET FOUR TIMES DAILY  . Beclomethasone Dipropionate (QNASL CHILDRENS) 40 MCG/ACT AERS Place into the nose.  . cetirizine (ZYRTEC) 10 MG tablet Take 10 mg by mouth daily.  . cycloSPORINE (RESTASIS) 0.05 % ophthalmic emulsion Place 1 drop into both eyes 2 (two) times daily.  Marland Kitchen  docusate sodium (COLACE) 100 MG capsule Take 100 mg by mouth 2 (two) times daily. PRN  . EPINEPHrine (EPI-PEN) 0.3 mg/0.3 mL SOAJ injection Inject 0.3 mg into the muscle as needed.  . gabapentin (NEURONTIN) 300 MG capsule Take 1 capsule (300 mg total) by mouth as directed. 1 capsule three x daily and two capsules at bedtime each day  . ipratropium-albuterol (DUONEB) 0.5-2.5 (3) MG/3ML SOLN Take 3 mLs by nebulization every 4 (four) hours as needed.  Marland Kitchen L-Methylfolate (DEPLIN) 15 MG TABS Take by mouth.  . lidocaine (LIDODERM) 5 % Place 3 patches onto the skin daily. Remove & Discard patch within 12 hours or as directed by MD  . medroxyPROGESTERone (DEPO-PROVERA) 150 MG/ML injection Inject 150 mg into the muscle as directed. Every 2 months  . meloxicam (MOBIC) 15 MG tablet Take 1 tablet (15 mg total) by mouth daily.  . montelukast (SINGULAIR) 10 MG tablet TAKE ONE TABLET DAILY AT BEDTIME  . Omega 3 1000 MG CAPS Take 1 capsule by mouth daily.  Marland Kitchen omeprazole (PRILOSEC) 40 MG capsule TAKE ONE (1) CAPSULE EACH DAY  . oxyCODONE-acetaminophen (PERCOCET) 7.5-325 MG per tablet One tablet every 6 hours as needed. No More Than 4 a day  . predniSONE (DELTASONE) 5 MG tablet Take 1 tablet (5 mg total) by mouth daily with breakfast.  . promethazine (PHENERGAN) 12.5 MG tablet Take 1 tablet (12.5 mg total) by mouth every 8 (eight) hours as needed for nausea or vomiting.  . vitamin E (VITAMIN E) 400 UNIT capsule  Take 400 Units by mouth daily.  ,ed     Review of Systems  Constitutional: Negative.   HENT: Negative.   Respiratory: Positive for chest tightness and shortness of breath.   Cardiovascular: Positive for palpitations.  Gastrointestinal: Negative.   Endocrine: Negative.   Genitourinary: Negative.   Neurological: Negative.   Psychiatric/Behavioral: Negative.   All other systems reviewed and are negative.      Objective:   Physical Exam  Constitutional: She is oriented to person, place, and time.  She appears well-developed and well-nourished.  HENT:  Nose: Nose normal.  Mouth/Throat: Oropharynx is clear and moist.  Eyes: EOM are normal.  Neck: Trachea normal, normal range of motion and full passive range of motion without pain. Neck supple. No JVD present. Carotid bruit is not present. No thyromegaly present.  Cardiovascular: Normal rate, regular rhythm, normal heart sounds and intact distal pulses.  Exam reveals no gallop and no friction rub.   No murmur heard. Pulmonary/Chest: Effort normal and breath sounds normal.  Abdominal: Soft. Bowel sounds are normal. She exhibits no distension and no mass. There is no tenderness.  Musculoskeletal: Normal range of motion.  Weakness in all extremities  Lymphadenopathy:    She has no cervical adenopathy.  Neurological: She is alert and oriented to person, place, and time. She has normal reflexes.  Skin: Skin is warm and dry.  Psychiatric: She has a normal mood and affect. Her behavior is normal. Judgment and thought content normal.   BP 132/86 mmHg  Pulse 102  Temp(Src) 96.8 F (36 C) (Oral)  Ht 5\' 9"  (1.753 m)  Wt 208 lb (94.348 kg)  BMI 30.70 kg/m2  EKG- sinus Durwin Reges, FNP        Assessment & Plan:   1. Heart palpitations   2. Tachycardia, paroxysmal   3. Chronic bronchitis, unspecified chronic bronchitis type   4. IBS (irritable bowel syndrome)   5. Gastroesophageal reflux disease without esophagitis   6. Fibromyalgia   7. Hyperlipidemia   8. Depression   9. Inflammatory autoimmune disorder    Meds ordered this encounter  Medications  . metoprolol succinate (TOPROL-XL) 25 MG 24 hr tablet    Sig: Take 1 tablet (25 mg total) by mouth daily.    Dispense:  90 tablet    Refill:  1    Order Specific Question:  Supervising Provider    Answer:  Ernestina Penna [[id number]]   Orders Placed This Encounter  Procedures  . CMP14+EGFR  . NMR, lipoprofile  . EKG 12-Lead   Keep follow up appointment with  specialist Continue all meds Delsym or mucinex OTC as needed rto prn  Mary-Margaret Daphine Deutscher, FNP

## 2014-12-29 NOTE — Addendum Note (Signed)
Addended by: Cleda DaubUCKER, Morgon Pamer G on: 12/29/2014 12:05 PM   Modules accepted: Orders

## 2014-12-29 NOTE — Addendum Note (Signed)
Addended by: Bennie PieriniMARTIN, MARY-MARGARET on: 12/29/2014 11:57 AM   Modules accepted: Orders

## 2014-12-30 LAB — CMP14+EGFR
A/G RATIO: 1.8 (ref 1.1–2.5)
ALBUMIN: 4.1 g/dL (ref 3.5–5.5)
ALT: 19 IU/L (ref 0–32)
AST: 19 IU/L (ref 0–40)
Alkaline Phosphatase: 96 IU/L (ref 39–117)
BILIRUBIN TOTAL: 0.3 mg/dL (ref 0.0–1.2)
BUN / CREAT RATIO: 13 (ref 8–20)
BUN: 10 mg/dL (ref 6–20)
CO2: 21 mmol/L (ref 18–29)
Calcium: 9 mg/dL (ref 8.7–10.2)
Chloride: 109 mmol/L — ABNORMAL HIGH (ref 97–108)
Creatinine, Ser: 0.78 mg/dL (ref 0.57–1.00)
GFR calc Af Amer: 118 mL/min/{1.73_m2} (ref >59)
GFR calc non Af Amer: 102 mL/min/{1.73_m2} (ref >59)
GLUCOSE: 104 mg/dL — AB (ref 65–99)
Globulin, Total: 2.3 g/dL (ref 1.5–4.5)
POTASSIUM: 3.3 mmol/L — AB (ref 3.5–5.2)
SODIUM: 143 mmol/L (ref 134–144)
Total Protein: 6.4 g/dL (ref 6.0–8.5)

## 2014-12-30 LAB — NMR, LIPOPROFILE
Cholesterol: 188 mg/dL (ref 100–199)
HDL Cholesterol by NMR: 65 mg/dL (ref >39)
HDL Particle Number: 37.8 umol/L (ref >=30.5)
LDL Particle Number: 1314 nmol/L — ABNORMAL HIGH (ref <1000)
LDL Size: 21.3 nm (ref >20.5)
LDL-C: 90 mg/dL (ref 0–99)
LP-IR Score: 33 (ref <=45)
Small LDL Particle Number: 602 nmol/L — ABNORMAL HIGH (ref <=527)
TRIGLYCERIDES BY NMR: 163 mg/dL — AB (ref 0–149)

## 2014-12-30 LAB — VITAMIN D 25 HYDROXY (VIT D DEFICIENCY, FRACTURES): Vit D, 25-Hydroxy: 11.3 ng/mL — ABNORMAL LOW (ref 30.0–100.0)

## 2015-01-05 ENCOUNTER — Other Ambulatory Visit: Payer: Self-pay | Admitting: Nurse Practitioner

## 2015-01-05 MED ORDER — FUROSEMIDE 20 MG PO TABS: 20.0000 mg | ORAL_TABLET | Freq: Every day | ORAL | Status: DC

## 2015-01-16 ENCOUNTER — Encounter: Payer: 59 | Attending: Physical Medicine & Rehabilitation | Admitting: Physical Medicine & Rehabilitation

## 2015-01-16 ENCOUNTER — Other Ambulatory Visit: Payer: Self-pay | Admitting: Nurse Practitioner

## 2015-01-16 ENCOUNTER — Encounter: Payer: Self-pay | Admitting: Physical Medicine & Rehabilitation

## 2015-01-16 VITALS — BP 144/75 | HR 94 | Resp 14

## 2015-01-16 DIAGNOSIS — M797 Fibromyalgia: Secondary | ICD-10-CM | POA: Diagnosis not present

## 2015-01-16 DIAGNOSIS — M329 Systemic lupus erythematosus, unspecified: Secondary | ICD-10-CM

## 2015-01-16 DIAGNOSIS — L930 Discoid lupus erythematosus: Secondary | ICD-10-CM

## 2015-01-16 DIAGNOSIS — R569 Unspecified convulsions: Secondary | ICD-10-CM

## 2015-01-16 DIAGNOSIS — M545 Low back pain, unspecified: Secondary | ICD-10-CM

## 2015-01-16 DIAGNOSIS — D8989 Other specified disorders involving the immune mechanism, not elsewhere classified: Secondary | ICD-10-CM

## 2015-01-16 DIAGNOSIS — M6249 Contracture of muscle, multiple sites: Secondary | ICD-10-CM | POA: Diagnosis not present

## 2015-01-16 DIAGNOSIS — M62838 Other muscle spasm: Secondary | ICD-10-CM

## 2015-01-16 MED ORDER — OXYCODONE-ACETAMINOPHEN 7.5-325 MG PO TABS: ORAL_TABLET | ORAL | Status: DC

## 2015-01-16 NOTE — Patient Instructions (Signed)
CONTINUE WORKING ON YOUR STAMINA AND REGULAR EXERCISE, STRETCHING, AND POSTURE.

## 2015-01-16 NOTE — Progress Notes (Signed)
Subjective:    Patient ID: Autumn Davis, female    DOB: 02-10-1986, 29 y.o.   MRN: [id number]  HPI   Autumn Davis is back regarding her chronic pain syndrome. I just reviewed her her neuro notes. Her myasthenia testing was negative. NCS/EMG was normal as well.   She has been feeling better as of late. She is not sure what has been making things better for her.   Her rib spasms have been better with the lidoderm patches although she still gets spasms in her back muscles.   She hasn't had any major "relapses"(majorpin flares) since she's been on the prednisone---she's taking 5mg  daily currently.   She has not found a rheumatologist to assess her case Pain Inventory Average Pain 9 Pain Right Now 7 My pain is constant  In the last 24 hours, has pain interfered with the following? General activity 8 Relation with others 8 Enjoyment of life 9 What TIME of day is your pain at its worst? evening and night Sleep (in general) Poor  Pain is worse with: walking, bending, standing and some activites Pain improves with: rest, heat/ice, therapy/exercise, medication and TENS Relief from Meds: 4  Mobility walk without assistance walk with assistance use a cane use a walker do you drive?  no  Function I need assistance with the following:  dressing, meal prep, household duties and shopping  Neuro/Psych bladder control problems weakness numbness tremor tingling trouble walking spasms dizziness  Prior Studies Any changes since last visit?  no  Physicians involved in your care Any changes since last visit?  no   Family History  Problem Relation Age of Onset  . Thyroid disease Mother   . Cancer Maternal Grandmother     lung  . Cancer Paternal Grandmother     colon/pancreatiec/lymphoma   History   Social History  . Marital Status: Married    Spouse Name: N/A  . Number of Children: N/A  . Years of Education: N/A   Occupational History  . disabled    Social  History Main Topics  . Smoking status: Never Smoker   . Smokeless tobacco: Never Used  . Alcohol Use: No  . Drug Use: No  . Sexual Activity: Not on file   Other Topics Concern  . None   Social History Narrative   Past Surgical History  Procedure Laterality Date  . Knee surgery  2002/2003    bil  . Rectal surgery  correction of prolapse    2010   Past Medical History  Diagnosis Date  . GERD (gastroesophageal reflux disease)   . Hyperlipidemia   . Seizures   . Thyroid disease   . Bronchitis, chronic/intermittent 01/22/2012  . Depression 01/22/2012  . IBS (irritable bowel syndrome) 01/22/2012  . Neuromuscular disorder   . SOB (shortness of breath)    BP 144/75 mmHg  Pulse 94  Resp 14  SpO2 99%  Opioid Risk Score:   Fall Risk Score: Moderate Fall Risk (6-13 points) (previously edcuated and given handout)  Review of Systems  Genitourinary:       Bladder control problems  Neurological: Positive for dizziness, tremors, weakness and numbness.       Tingling  All other systems reviewed and are negative.      Objective:   Physical Exam   General: Alert and oriented x 3, No apparent distress. Has gained some weight.  HEENT: Head is normocephalic, atraumatic, PERRLA, EOMI, sclera anicteric, oral mucosa pink and moist, dentition intact, ext  ear canals clear,  Neck: Supple without JVD or lymphadenopathy  Heart: Reg rate and rhythm. No murmurs rubs or gallops  Chest: CTA bilaterally without wheezes, rales, or rhonchi; no distress  Abdomen: Soft, non-tender, non-distended, bowel sounds positive.  Extremities: No clubbing, cyanosis, or edema. Pulses are 2+  Skin: Clean and intact without signs of breakdown. She had a malar rash over the neck.  Neuro: Pt displays generally intact insight and awareness. CN exam appears intact without focal abnormalities today. She has good vocal quality, cough. Vision intact. Strength is grossly 4 to 4+/5 in the upper limbs with improved ROM  noted--effort not 100%. Sensation is decreased below the waist bilaterally, distal more than proximal to PP/LT. She has decreased proprioception in both legs as well. Muscle tightness/tone appears decreased today. DTR's remain today in general at 1+. She uses a cane for balance and walks fairly steady.  Musculoskeletal: She has generalized joint pain but now swelling or gross abnormalities. She has a lot of tenderness in the right lower rib cage/axillary line. Posture seems a little better when sitting.  Psych: appears more alert. Pleasant and appropriate.   Assessment & Plan:   1. Chronic seizure disorder---? myoclonic jerks/myotonia/ or some other abnormal muscle movement as opposed to seizures? ?MS  2. Chronic muscle spasms, weakness with associated pain disorder---?autoimmune disorder, ?lupus-like syndrome.  3. Chronic dysphagia  4. Right low back/flank pain  5. Fibromyalgia syndrome  6. Anxiety with depression  7. Pulmonary function abnormalities., ?CO2 retention? ---recent normal ECHO from 09/15/14     Plan:  1. Maintain elavil at 50mg  qhs  2. Maintain baclofen to 20mg  for muscle spasms. Still can consider klonopin trial in the future. Needs to work on daily stretching and ROM program. 3. Make a rheum referral to Dr. Link Snuffer given hx of +ANA (low titers) for assessment of potential lupus like syndrome (although this is looking less likely). She has been doing extremely well since being on the low dose prednisone, although I don't want to keep her on this indefinitely without some type of inflammatory/autoimmune dx.  4. Continue meloxicam 15mg  daily  5. Oxycodone 7.5mg  fr breakthrough pain #120.  6. Maintain gabapentin to 300mg  tid and 600mg  qhs for seizures, spasms, neuro pain  7. Continue lidoderm patches for trunk/back pain.  8. Follow up with me or NP in about one month. 30 minutes of face to face patient care time were spent during this visit. All questions were encouraged and  answered.

## 2015-01-18 ENCOUNTER — Other Ambulatory Visit: Payer: Self-pay | Admitting: Registered Nurse

## 2015-02-05 ENCOUNTER — Other Ambulatory Visit: Payer: Self-pay | Admitting: Nurse Practitioner

## 2015-02-14 ENCOUNTER — Other Ambulatory Visit: Payer: Self-pay | Admitting: Registered Nurse

## 2015-02-16 ENCOUNTER — Encounter: Payer: Self-pay | Admitting: Registered Nurse

## 2015-02-16 ENCOUNTER — Encounter: Payer: 59 | Attending: Physical Medicine & Rehabilitation | Admitting: Registered Nurse

## 2015-02-16 VITALS — BP 135/82 | HR 87 | Resp 14

## 2015-02-16 DIAGNOSIS — M545 Low back pain: Secondary | ICD-10-CM

## 2015-02-16 DIAGNOSIS — M6249 Contracture of muscle, multiple sites: Secondary | ICD-10-CM | POA: Insufficient documentation

## 2015-02-16 DIAGNOSIS — Z5181 Encounter for therapeutic drug level monitoring: Secondary | ICD-10-CM

## 2015-02-16 DIAGNOSIS — M62838 Other muscle spasm: Secondary | ICD-10-CM

## 2015-02-16 DIAGNOSIS — M797 Fibromyalgia: Secondary | ICD-10-CM | POA: Diagnosis not present

## 2015-02-16 DIAGNOSIS — Z79899 Other long term (current) drug therapy: Secondary | ICD-10-CM

## 2015-02-16 DIAGNOSIS — R569 Unspecified convulsions: Secondary | ICD-10-CM | POA: Diagnosis not present

## 2015-02-16 DIAGNOSIS — R0781 Pleurodynia: Secondary | ICD-10-CM | POA: Diagnosis not present

## 2015-02-16 DIAGNOSIS — M329 Systemic lupus erythematosus, unspecified: Secondary | ICD-10-CM

## 2015-02-16 MED ORDER — OXYCODONE-ACETAMINOPHEN 7.5-325 MG PO TABS: ORAL_TABLET | ORAL | Status: DC

## 2015-02-16 MED ORDER — BACLOFEN 20 MG PO TABS: ORAL_TABLET | ORAL | Status: DC

## 2015-02-16 MED ORDER — PREDNISONE 5 MG PO TABS: 5.0000 mg | ORAL_TABLET | Freq: Every day | ORAL | Status: DC

## 2015-02-16 MED ORDER — GABAPENTIN 300 MG PO CAPS: ORAL_CAPSULE | ORAL | Status: DC

## 2015-02-16 NOTE — Progress Notes (Signed)
Subjective:    Patient ID: Autumn Davis, female    DOB: Apr 06, 1986, 29 y.o.   MRN: [id number]  HPI: [name] is a 29 year old female who returns for follow up for chronic pain and medication refill. She says her pain is in her rib cage on the right side and left leg numbness and tingling. She rates her pain 7. Her current exercise regime is performing stretching exercises with bands and chair exercises. She states her PCP diagnosed her with lymphedema she's wearing compression stockings and wearing ankle brace. Awaiting to hear from Dr. Link Snuffer office for an appointment.  Pain Inventory Average Pain 9 Pain Right Now 7 My pain is constant, sharp, burning, dull, stabbing, tingling and aching  In the last 24 hours, has pain interfered with the following? General activity 8 Relation with others 9 Enjoyment of life 9 What TIME of day is your pain at its worst? evening, night Sleep (in general) Poor  Pain is worse with: walking, bending, standing and some activites Pain improves with: rest, heat/ice, therapy/exercise, pacing activities, medication and TENS Relief from Meds: 5  Mobility walk without assistance walk with assistance use a cane use a walker ability to climb steps?  no do you drive?  no use a wheelchair transfers alone  Function disabled: date disabled . I need assistance with the following:  dressing, bathing, meal prep, household duties and shopping  Neuro/Psych bladder control problems weakness numbness tremor tingling trouble walking spasms dizziness  Prior Studies Any changes since last visit?  no  Physicians involved in your care Any changes since last visit?  no   Family History  Problem Relation Age of Onset  . Thyroid disease Mother   . Cancer Maternal Grandmother     lung  . Cancer Paternal Grandmother     colon/pancreatiec/lymphoma   History   Social History  . Marital Status: Married    Spouse Name:  N/A  . Number of Children: N/A  . Years of Education: N/A   Occupational History  . disabled    Social History Main Topics  . Smoking status: Never Smoker   . Smokeless tobacco: Never Used  . Alcohol Use: No  . Drug Use: No  . Sexual Activity: Not on file   Other Topics Concern  . None   Social History Narrative   Past Surgical History  Procedure Laterality Date  . Knee surgery  2002/2003    bil  . Rectal surgery  correction of prolapse    2010   Past Medical History  Diagnosis Date  . GERD (gastroesophageal reflux disease)   . Hyperlipidemia   . Seizures   . Thyroid disease   . Bronchitis, chronic/intermittent 01/22/2012  . Depression 01/22/2012  . IBS (irritable bowel syndrome) 01/22/2012  . Neuromuscular disorder   . SOB (shortness of breath)    BP 135/82 mmHg  Pulse 87  Resp 14  SpO2 98%  Opioid Risk Score:   Fall Risk Score: High Fall Risk (>13 points)`1  Depression screen PHQ 2/9  Depression screen [facility] 2/9 02/16/2015 03/13/2014  Decreased Interest 1 1  Down, Depressed, Hopeless 0 1  PHQ - 2 Score 1 2  Altered sleeping 3 0  Tired, decreased energy 1 0  Change in appetite 1 0  Feeling bad or failure about yourself  0 0  Trouble concentrating 1 0  Moving slowly or fidgety/restless 0 0  Suicidal thoughts 0 0  PHQ-9 Score 7  2      Review of Systems  Genitourinary: Positive for urgency and frequency.       Incontinence  Neurological: Positive for dizziness, tremors, weakness and numbness.       Tingling Spasms   All other systems reviewed and are negative.      Objective:   Physical Exam  Constitutional: She is oriented to person, place, and time. She appears well-developed and well-nourished.  HENT:  Head: Normocephalic and atraumatic.  Neck: Normal range of motion. Neck supple.  Pulmonary/Chest: Effort normal and breath sounds normal.  Abdominal: Soft. Bowel sounds are normal.  Musculoskeletal:  Normal Muscle Bulk and Muscle Testing  Reveals: Upper Extremities: Full ROM and Muscle Strength 4/5 Back without spinal or paraspinal tenderness Lower Extremities: Full ROM and Muscle Strength 5/5 Bilateral Ankle Brace Intact Arises from chair with ease/ using straight cane  Neurological: She is alert and oriented to person, place, and time.  Skin: Skin is warm and dry.  Psychiatric: She has a normal mood and affect.  Nursing note and vitals reviewed.         Assessment & Plan:  1. Chronic seizure disorder: No seizure's. Continue Gabapentin. Neurology Following  2. Chronic muscle spasms, weakness with associated pain disorder: Continue Baclofen and MOBIC. Continue with Exercise regime. 3. Chronic dysphagia: GI Following  4. Anxiety with depression : Stable. Continue Elavil  5. Fibromyalgia/ Chronic Pain: Continue with exercise and heat Therapy.  Refilled: oxyCODONE 7.5/325mg  one tablet every 6 hours as needed #120.    30 minutes of face to face patient care time was spent during this visit. All questions were encouraged and answered.

## 2015-02-23 ENCOUNTER — Ambulatory Visit (INDEPENDENT_AMBULATORY_CARE_PROVIDER_SITE_OTHER): Payer: 59 | Admitting: *Deleted

## 2015-02-23 DIAGNOSIS — Z23 Encounter for immunization: Secondary | ICD-10-CM | POA: Diagnosis not present

## 2015-02-23 NOTE — Patient Instructions (Signed)
Diphtheria/Tetanus Toxoids; Pertussis Vaccine, DTP injection  What is this medicine?  DIPHTHERIA and TETANUS TOXOIDS; PERTUSSIS VACCINE (dif THEER ee uh and TET n us TOK soids; per TUS iss VAK seen) is used to prevent diphtheria, tetanus, and pertussis infections.  This medicine may be used for other purposes; ask your health care provider or pharmacist if you have questions.  COMMON BRAND NAME(S): Adacel, Boostrix, Certiva, Daptacel, Infanrix, Tripedia  What should I tell my health care provider before I take this medicine?  They need to know if you have any of these conditions:  -blood disorders like hemophilia  -fever or infection  -immune system problems  -neurologic disease  -seizures  -an unusual or allergic reaction to vaccines, thimerosal, latex, other medicines, foods, dyes, or preservatives  -pregnant or trying to get pregnant  -breast-feeding  How should I use this medicine?  This vaccine is for injection into a muscle. It is given by a health care professional.  A copy of Vaccine Information Statements will be given before each vaccination. Read this sheet carefully each time. The sheet may change frequently.  Talk to your pediatrician regarding the use of this vaccine in children. While the DTP vaccine may be given to children ages 6 weeks to 7 years and the Tdap vaccine may be given to children at least 10 years old, precautions do apply.  Overdosage: If you think you have taken too much of this medicine contact a poison control center or emergency room at once.  NOTE: This medicine is only for you. Do not share this medicine with others.  What if I miss a dose?  It is important not to miss your dose. Call your doctor or health care professional if you are unable to keep an appointment.  What may interact with this medicine?  -immune globulin  -medicines that suppress your immune function like adalimumab, anakinra, infliximab  -medicines to treat cancer  -medicines that treat or prevent blood clots  like warfarin, enoxaparin, and dalteparin  -steroid medicines like prednisone or cortisone  This list may not describe all possible interactions. Give your health care provider a list of all the medicines, herbs, non-prescription drugs, or dietary supplements you use. Also tell them if you smoke, drink alcohol, or use illegal drugs. Some items may interact with your medicine.  What should I watch for while using this medicine?  See your health care provider for all shots of this vaccine as directed. To have protection from infection, you must have 3 shots of this vaccine plus boosters as needed. Tell your doctor right away if you have any serious or unusual side effects after getting this vaccine.  What side effects may I notice from receiving this medicine?  Side effects that you should report to your doctor or health care professional as soon as possible:  -allergic reactions like skin rash, itching or hives, swelling of the face, lips, or tongue  -breathing problems  -fever of 103 degrees F or more  -flu-like symptoms  -inconsolable crying  -infection  -pain, tingling, numbness in the hands or feet  -seizures  -swelling of arm or leg that was injected  -unusually weak or tired  Side effects that usually do not require immediate medical attention (report these side effects to your doctor or health care professional if they continue or are bothersome):  -fussy, irritable  -loss of appetite  -fever of 102 degrees F or less  -pain, tenderness, redness, swelling, or a 'knot' at site where injected  -  vomiting  This list may not describe all possible side effects. Call your doctor for medical advice about side effects. You may report side effects to FDA at 1-800-FDA-1088.  Where should I keep my medicine?  This drug is given in a hospital or clinic and will not be stored at home.  NOTE: This sheet is a summary. It may not cover all possible information. If you have questions about this medicine, talk to your doctor,  pharmacist, or health care provider.  © 2015, [employer]/Gold [employer]. (2014-03-06 21:24:02)

## 2015-02-23 NOTE — Progress Notes (Signed)
Pt given Boostrix IM right deltoid and tolerated well.

## 2015-02-27 ENCOUNTER — Other Ambulatory Visit: Payer: 59

## 2015-02-27 DIAGNOSIS — R569 Unspecified convulsions: Secondary | ICD-10-CM

## 2015-02-27 NOTE — Progress Notes (Signed)
Ab only

## 2015-03-06 LAB — ACETAZOLAMIDE, (DIAMOX), S/P: Acetazolamide: 3.6 ug/ml — ABNORMAL LOW

## 2015-03-09 ENCOUNTER — Other Ambulatory Visit: Payer: Self-pay | Admitting: Nurse Practitioner

## 2015-03-13 ENCOUNTER — Ambulatory Visit (INDEPENDENT_AMBULATORY_CARE_PROVIDER_SITE_OTHER): Payer: 59 | Admitting: Internal Medicine

## 2015-03-13 ENCOUNTER — Encounter: Payer: Self-pay | Admitting: Internal Medicine

## 2015-03-13 VITALS — BP 122/90 | HR 92 | Ht 68.0 in | Wt 216.0 lb

## 2015-03-13 DIAGNOSIS — R0600 Dyspnea, unspecified: Secondary | ICD-10-CM

## 2015-03-13 NOTE — Progress Notes (Signed)
Subjective:    Patient ID: Autumn Davis, female    DOB: Mar 30, 1986, 29 y.o.   MRN: [id number]  HPI    OV 04/04/14 Chief Complaint  Patient presents with  . Pulmonary Consult    Referred by Dr. Purcell Nails for SOB and cough x 3 years.    29 year old female accompanied by her father. Reports dyspnea and cough for the last few years. Her background history is very complex and enumerated below. Main issue is dyspnea. Insidious onset. Ongoing for a few years. Progressive. Moderate in intensity. Notices dyspnea random episodic associated with throat choking and tightness. Happens or 24 hours but particularly more so late in the evening and early morning. There is associated acid reflux for which she is on proton pump inhibitors but says these medicines make her feel worse. There is also some dyspnea with exertion and symptoms are associated with some muscle pull symptoms in bilateral infra-axillary areas. Also, epi shots help relive symptoms. 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 ANA July 2040 and was 1:80 but she says after this it rose to 1:300. She was seen by a rheumatologist in [place] but apparently she's been placed on expectant followup. She is not happy about it. In addition she is seeing a neurologist both at [facility] and [place]. 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 She uses a cane to walk but a few years ago was in a wheelchair. Multiple sclerosis is suspected but apparently has never been confirmed. The diagnoses of all this is unclear. Lyme antibody titer 2015 was normal. She has had MRI brain and cervical spine MRI in February 2014 and this was normal. Symptoms intermittent and fluctuant     There is a mild dry cough associated with this.   Dyspnea relevant hx  CXR March 2-15 - clear Hgb 13.3gm% - march 2015 Body mass index is 29.08  kg/(m^2). ANA - positive, 1:80, CCP 2, RF < 10 - July 2014 Walking desaturation test in office: 185 feet x 3 laps: lowest pulse ox 91% Echocardiogram: Reportedly  at some unknown time but she does not know when and where. I can't find this information      REC #Cough  - do cough score sheet part 1 now (update: will have to have her redo at followup)   #Shortness of breath - stop symbicort, do nebs as needed - do d-dimer blood test for blood clot evaluation  - do full PFT breathing test asap; call me once done so I Can advise next step  #Followup - depends on outcome of above tests; please call me to get results of breathing test      OV 07/14/2014  Chief Complaint  Patient presents with  . Follow-up    Pt here after PFT in 04/2014. Pt states her breathing has slightly improved since last OV. Pt states she is not needing to use her neb meds as often. Pt mild dry cough and rib pain on both sides and orthopnea.    Followup dyspnea  - She does have a having blood D-dimer test which was normal and pulmonary function test which is depicted below that shows obstruction with fairly normal flow-volume loops. She now presents with a husband. Her father is not with her. She describes that since her last visit she's actually feeling better. She says this is part of normal health cycle  where she can cycle between extremely deconditioned functional status with large shortness of breath and cough using a wheelchair and then the current state which is actually better and is able to walk with a cane. Some weeks ago my colleague reviewed her pulmonary function tests and called and Symbicort base and presumptive diagnosis of asthma which is held off taking this because in the past trial of Symbicort for a few weeks did not help her and when she came of Symbicort she did not feel any worse. She is open to taking Symbicort if I recommended that. Of note, she reports having had allergy evaluation or 5  years ago th details of which he does not know  Pulmonary function test 05/05/2014 shows FEV1 1.06/35%. Ratio 70 and consistent with obstruction. Postbronchodilator FEV1 is 1.6 L showing significant broncho-dilator response. Total lung capacity is 3.9 cm/70%.  Her spirometry today in the office it is restriction of the forced vital capacity of 2.1 L or 49% predicted FEV1 of and a ratio of over 90%   OV 09/08/2014  Chief Complaint  Patient presents with  . Follow-up    Pt states she hasn't need to use her albuterol hfa very often. Pt c/o chest pain with inspiration, DOE and mild dry cough at night.     Followup cardiopulmonary stress test done in 08/18/2014. Indications dyspnea.  - Cardiopulmonary stress test to submaximal RER 1.02  effort but only barely. The performance capacity VO2 max was significantly reduced along with anaerobic threshold. Assuming she gave submaximal effort this suggests significant physiologic limitation as a cause of her dyspnea. She was reaching ventilatory limitation at 63%. In combination with this her heart rate was was reduced and she had a restrictive spirometry. This suggests that she did not reach expected physiologic normal heart rate response with exercise even though at baseline she has tachycardia. Her dead space ventilation was increased. All this taken together suggest neuromuscular limitation. Alternative differential diagnosis is diastolic dysfunction and physiologic deconditioning or just poor effort  - I inquired into her neurologic history. She has a neurologist Dr. Lynnell Grain outside  [facility]. She was briefly seen at [facility]. However, she says that even though there was some positive results no specific diagnosis has been given. Multiple sclerosis was entertained. She prefers that her care is now consolidated  [facility]. She is very open to the idea of a local neurologic referral   OV 03/13/2015  Chief Complaint    Patient presents with  . Follow-up    Pt here after CPST and echo. Pt saw Dr. Allena Katz for neuromuscular. Pt has had issues trying to find a rheumatologist. Pt stated she has good days and bad days with her breathing. Pt c/o waking during the night because of being so cold and feels very short of breath at that time.    Follow-up dyspnea. Since last seeing October 2015 she says that overall overall quality of life is the same. Dyspnea is up-and-down. No new changes.. She is here to review results. CPST - as above. ECHO 10/30/1 - normal. She reports that she is seen neurology in the interim. After that visit apparently and no conduction study was done and that she was reassured over the phone and no follow-up has been arranged. She is frustrated se feels that she needs a local neurologist. She Jola Babinski is also trying to get referred to a local rheumatologist but apparently theyhave declined her. 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 She is willing to attend pulmonary rehabilitation  Of note, she has seen the allergist in the interim. Apparently skin test was only mildly positive. She's on nasal steroids. She is due to see the allergist again tomorrow. Exhaled nitric oxide today in our office is17ppb and does not show evidence of steroid responsive asthma     Review of Systems  Constitutional: Negative for fever and unexpected weight change.  HENT: Negative for congestion, dental problem, ear pain, nosebleeds, postnasal drip, rhinorrhea, sinus pressure, sneezing, sore throat and trouble swallowing.   Eyes: Negative for redness and itching.  Respiratory: Positive for cough, chest tightness and shortness of breath. Negative for wheezing.   Cardiovascular: Negative for palpitations and leg swelling.  Gastrointestinal: Negative for nausea and vomiting.  Genitourinary: Negative for dysuria.   Musculoskeletal: Negative for joint swelling.  Skin: Negative for rash.  Neurological: Negative for headaches.  Hematological: Does not bruise/bleed easily.  Psychiatric/Behavioral: Negative for dysphoric mood. The patient is not nervous/anxious.        Objective:   Physical Exam  Filed Vitals:   03/13/15 1501  BP: 122/90  Pulse: 92  Height: 5\' 8"  (1.727 m)  Weight: 216 lb (97.977 kg)  SpO2: 96%   Obese Sitting calm [place] at her side  Discussion visit     Assessment & Plan:     ICD-9-CM ICD-10-CM   1. Dyspnea 786.09 R06.00    #Dyspnea  - unclear cause  - no evidence of asthma - likely physical deconditioning v neuromuscular diseae; suspect the former - work with primary care for rheumatology referral  Plan -refer [facility] for pulmonary rehab - review 9 months or sooner if needed   (> 50% of this 15 min visit spent in face to face counseling or/and coordination of care)   Dr. Kalman Shan, M.D., [facility].C.P Pulmonary and Critical Care Medicine Staff Physician [facility] [place] [facility] Pager: [phone], If no answer or between  15:00h - 7:00h: call [phone]  03/13/2015 3:46 PM

## 2015-03-13 NOTE — Patient Instructions (Addendum)
#  Dyspnea  - unclear cause  - no evidence of asthma - likely physical deconditioning v neuromuscular diseae; suspect the former - work with primary care for rheumatology referral  Plan -refer [facility] for pulmonary rehab - review 9 months or sooner if needed

## 2015-03-14 DIAGNOSIS — T783XXA Angioneurotic edema, initial encounter: Secondary | ICD-10-CM | POA: Diagnosis not present

## 2015-03-14 DIAGNOSIS — J4540 Moderate persistent asthma, uncomplicated: Secondary | ICD-10-CM | POA: Diagnosis not present

## 2015-03-14 DIAGNOSIS — J3089 Other allergic rhinitis: Secondary | ICD-10-CM | POA: Diagnosis not present

## 2015-03-19 ENCOUNTER — Other Ambulatory Visit: Payer: Self-pay | Admitting: Registered Nurse

## 2015-03-19 ENCOUNTER — Encounter: Payer: BLUE CROSS/BLUE SHIELD | Attending: Registered Nurse | Admitting: Registered Nurse

## 2015-03-19 ENCOUNTER — Encounter: Payer: Self-pay | Admitting: Registered Nurse

## 2015-03-19 VITALS — BP 130/84 | HR 88 | Resp 14

## 2015-03-19 DIAGNOSIS — F329 Major depressive disorder, single episode, unspecified: Secondary | ICD-10-CM | POA: Diagnosis not present

## 2015-03-19 DIAGNOSIS — G894 Chronic pain syndrome: Secondary | ICD-10-CM | POA: Diagnosis not present

## 2015-03-19 DIAGNOSIS — G40909 Epilepsy, unspecified, not intractable, without status epilepticus: Secondary | ICD-10-CM | POA: Diagnosis not present

## 2015-03-19 DIAGNOSIS — F419 Anxiety disorder, unspecified: Secondary | ICD-10-CM | POA: Diagnosis not present

## 2015-03-19 DIAGNOSIS — M62838 Other muscle spasm: Secondary | ICD-10-CM

## 2015-03-19 DIAGNOSIS — R0781 Pleurodynia: Secondary | ICD-10-CM | POA: Insufficient documentation

## 2015-03-19 DIAGNOSIS — M2550 Pain in unspecified joint: Secondary | ICD-10-CM | POA: Insufficient documentation

## 2015-03-19 DIAGNOSIS — M545 Low back pain: Secondary | ICD-10-CM

## 2015-03-19 DIAGNOSIS — M6249 Contracture of muscle, multiple sites: Secondary | ICD-10-CM | POA: Diagnosis not present

## 2015-03-19 DIAGNOSIS — M797 Fibromyalgia: Secondary | ICD-10-CM | POA: Insufficient documentation

## 2015-03-19 DIAGNOSIS — R1310 Dysphagia, unspecified: Secondary | ICD-10-CM | POA: Insufficient documentation

## 2015-03-19 DIAGNOSIS — G8929 Other chronic pain: Secondary | ICD-10-CM | POA: Diagnosis not present

## 2015-03-19 DIAGNOSIS — R569 Unspecified convulsions: Secondary | ICD-10-CM

## 2015-03-19 DIAGNOSIS — Z5181 Encounter for therapeutic drug level monitoring: Secondary | ICD-10-CM

## 2015-03-19 DIAGNOSIS — Z79899 Other long term (current) drug therapy: Secondary | ICD-10-CM | POA: Diagnosis not present

## 2015-03-19 MED ORDER — OXYCODONE-ACETAMINOPHEN 7.5-325 MG PO TABS
ORAL_TABLET | ORAL | Status: DC
Start: 2015-03-19 — End: 2015-04-18

## 2015-03-19 NOTE — Progress Notes (Signed)
Subjective:    Patient ID: Autumn Davis, female    DOB: [birth year], 29 y.o.   MRN: [id number]  HPI: [name] is a 29 year old female who returns for follow up for chronic pain and medication refill. She says her pain is in her rib cage on the right side and joint pain. She rates her pain 8. Her current exercise regime is performing stretching exercises with bands and chair exercises.  She's still waiting to hear from Dr. Link Snuffer office for an appointment.  Pain Inventory Average Pain 8 Pain Right Now 8 My pain is constant, sharp, burning, dull, stabbing, tingling and aching  In the last 24 hours, has pain interfered with the following? General activity 8 Relation with others 8 Enjoyment of life 8 What TIME of day is your pain at its worst? evening and night Sleep (in general) Poor  Pain is worse with: walking, bending, sitting, inactivity, standing and some activites Pain improves with: rest, heat/ice, therapy/exercise, medication and TENS Relief from Meds: 4  Mobility walk without assistance use a cane how many minutes can you walk? 10 ability to climb steps?  no do you drive?  no  Function disabled: date disabled .  Neuro/Psych bladder control problems weakness numbness tremor tingling trouble walking spasms dizziness  Prior Studies Any changes since last visit?  no  Physicians involved in your care Any changes since last visit?  no   Family History  Problem Relation Age of Onset  . Thyroid disease Mother   . Cancer Maternal Grandmother     lung  . Cancer Paternal Grandmother     colon/pancreatiec/lymphoma   History   Social History  . Marital Status: Married    Spouse Name: N/A  . Number of Children: N/A  . Years of Education: N/A   Occupational History  . disabled    Social History Main Topics  . Smoking status: Never Smoker   . Smokeless tobacco: Never Used  . Alcohol Use: No  . Drug Use: No  . Sexual  Activity: Not on file   Other Topics Concern  . None   Social History Narrative   Past Surgical History  Procedure Laterality Date  . Knee surgery  2002/2003    bil  . Rectal surgery  correction of prolapse    2010   Past Medical History  Diagnosis Date  . GERD (gastroesophageal reflux disease)   . Hyperlipidemia   . Seizures   . Thyroid disease   . Bronchitis, chronic/intermittent 01/22/2012  . Depression 01/22/2012  . IBS (irritable bowel syndrome) 01/22/2012  . Neuromuscular disorder   . SOB (shortness of breath)    BP 130/84 mmHg  Pulse 88  Resp 14  SpO2 96%  Opioid Risk Score:   Fall Risk Score: Low Fall Risk (0-5 points)`1  Depression screen PHQ 2/9  Depression screen [facility] 2/9 02/16/2015 03/13/2014  Decreased Interest 1 1  Down, Depressed, Hopeless 0 1  PHQ - 2 Score 1 2  Altered sleeping 3 0  Tired, decreased energy 1 0  Change in appetite 1 0  Feeling bad or failure about yourself  0 0  Trouble concentrating 1 0  Moving slowly or fidgety/restless 0 0  Suicidal thoughts 0 0  PHQ-9 Score 7 2     Review of Systems  HENT: Negative.   Eyes: Negative.   Respiratory: Negative.   Cardiovascular: Negative.   Gastrointestinal: Negative.   Endocrine: Negative.   Genitourinary:  Bladder control problem  Musculoskeletal: Positive for myalgias, back pain and arthralgias.  Skin: Negative.   Allergic/Immunologic: Negative.   Neurological: Positive for dizziness, tremors, weakness and numbness.       Tingling, trouble walking, spasms  Hematological: Negative.   Psychiatric/Behavioral: Negative.        Objective:   Physical Exam  Constitutional: She is oriented to person, place, and time. She appears well-developed and well-nourished.  HENT:  Head: Normocephalic and atraumatic.  Neck: Normal range of motion. Neck supple.  Cardiovascular: Normal rate and regular rhythm.   Pulmonary/Chest: Effort normal and breath sounds normal.  Musculoskeletal:  Normal  Muscle Bulk and Muscle Testing Reveals: Upper Extremities: Decreased ROM 90 Degrees and Muscle Strength 5/5 Thoracic Paraspinal Tenderness: T-5- T-8 Lower Extremities: Full ROM and Muscle Strength 5/5 Arises from chair with ease Narrow Based gait  Neurological: She is alert and oriented to person, place, and time.  Skin: Skin is warm and dry.  Psychiatric: She has a normal mood and affect.  Nursing note and vitals reviewed.         Assessment & Plan:  1. Chronic seizure disorder: No seizure's. Continue Gabapentin. Neurology Following  2. Chronic muscle spasms, weakness with associated pain disorder: Continue Baclofen and MOBIC. Continue with Exercise regime. 3. Chronic dysphagia: GI Following  4. Anxiety with depression : Stable. Continue Elavil  5. Fibromyalgia/ Chronic Pain: Continue with exercise and heat Therapy.  Refilled: oxyCODONE 7.5/325mg  one tablet every 6 hours as needed #120.    30 minutes of face to face patient care time was spent during this visit. All questions were encouraged and answered.

## 2015-03-20 LAB — PMP ALCOHOL METABOLITE (ETG): Ethyl Glucuronide (EtG): NEGATIVE ng/mL

## 2015-03-22 LAB — OPIATES/OPIOIDS (LC/MS-MS)
Codeine Urine: NEGATIVE ng/mL (ref <50)
Hydrocodone: NEGATIVE ng/mL (ref <50)
Hydromorphone: NEGATIVE ng/mL (ref <50)
Morphine Urine: NEGATIVE ng/mL (ref <50)
Norhydrocodone, Ur: NEGATIVE ng/mL (ref <50)
Noroxycodone, Ur: 4916 ng/mL (ref <50)
OXYCODONE, UR: 3274 ng/mL (ref <50)
OXYMORPHONE, URINE: 1383 ng/mL (ref <50)

## 2015-03-22 LAB — OXYCODONE, URINE (LC/MS-MS)
Noroxycodone, Ur: 4916 ng/mL (ref <50)
OXYMORPHONE, URINE: 1383 ng/mL (ref <50)
Oxycodone, ur: 3274 ng/mL (ref <50)

## 2015-03-23 LAB — PRESCRIPTION MONITORING PROFILE (SOLSTAS)
Amphetamine/Meth: NEGATIVE ng/mL
BUPRENORPHINE, URINE: NEGATIVE ng/mL
Barbiturate Screen, Urine: NEGATIVE ng/mL
Benzodiazepine Screen, Urine: NEGATIVE ng/mL
CREATININE, URINE: 161.43 mg/dL (ref >20.0)
Cannabinoid Scrn, Ur: NEGATIVE ng/mL
Carisoprodol, Urine: NEGATIVE ng/mL
Cocaine Metabolites: NEGATIVE ng/mL
FENTANYL URINE: NEGATIVE ng/mL
MDMA URINE: NEGATIVE ng/mL
MEPERIDINE UR: NEGATIVE ng/mL
METHADONE SCREEN, URINE: NEGATIVE ng/mL
Nitrites, Initial: NEGATIVE ug/mL
PROPOXYPHENE: NEGATIVE ng/mL
Tapentadol, urine: NEGATIVE ng/mL
Tramadol Scrn, Ur: NEGATIVE ng/mL
Zolpidem, Urine: NEGATIVE ng/mL
pH, Initial: 5.1 pH (ref 4.5–8.9)

## 2015-03-28 DIAGNOSIS — F411 Generalized anxiety disorder: Secondary | ICD-10-CM | POA: Diagnosis not present

## 2015-03-29 ENCOUNTER — Ambulatory Visit (INDEPENDENT_AMBULATORY_CARE_PROVIDER_SITE_OTHER): Payer: BLUE CROSS/BLUE SHIELD | Admitting: Nurse Practitioner

## 2015-03-29 ENCOUNTER — Other Ambulatory Visit: Payer: Self-pay | Admitting: Nurse Practitioner

## 2015-03-29 ENCOUNTER — Encounter: Payer: Self-pay | Admitting: Nurse Practitioner

## 2015-03-29 VITALS — BP 120/79 | HR 87 | Temp 97.5°F | Ht 68.0 in | Wt 218.6 lb

## 2015-03-29 DIAGNOSIS — K219 Gastro-esophageal reflux disease without esophagitis: Secondary | ICD-10-CM

## 2015-03-29 DIAGNOSIS — F329 Major depressive disorder, single episode, unspecified: Secondary | ICD-10-CM | POA: Diagnosis not present

## 2015-03-29 DIAGNOSIS — E785 Hyperlipidemia, unspecified: Secondary | ICD-10-CM

## 2015-03-29 DIAGNOSIS — K589 Irritable bowel syndrome without diarrhea: Secondary | ICD-10-CM

## 2015-03-29 DIAGNOSIS — Z309 Encounter for contraceptive management, unspecified: Secondary | ICD-10-CM

## 2015-03-29 DIAGNOSIS — R0600 Dyspnea, unspecified: Secondary | ICD-10-CM

## 2015-03-29 DIAGNOSIS — M797 Fibromyalgia: Secondary | ICD-10-CM | POA: Diagnosis not present

## 2015-03-29 DIAGNOSIS — F32A Depression, unspecified: Secondary | ICD-10-CM

## 2015-03-29 DIAGNOSIS — E079 Disorder of thyroid, unspecified: Secondary | ICD-10-CM

## 2015-03-29 DIAGNOSIS — L930 Discoid lupus erythematosus: Secondary | ICD-10-CM | POA: Diagnosis not present

## 2015-03-29 DIAGNOSIS — D8989 Other specified disorders involving the immune mechanism, not elsewhere classified: Secondary | ICD-10-CM

## 2015-03-29 MED ORDER — MEDROXYPROGESTERONE ACETATE 150 MG/ML IM SUSP
150.0000 mg | INTRAMUSCULAR | Status: AC
  Administered 2015-03-29 – 2016-01-10 (×4): 150 mg via INTRAMUSCULAR

## 2015-03-29 NOTE — Addendum Note (Signed)
Addended by: Tamera PuntWRAY, Brenlyn Beshara S on: 03/29/2015 12:08 PM   Modules accepted: Orders

## 2015-03-29 NOTE — Progress Notes (Signed)
Subjective:    Patient ID: Autumn Davis, female    DOB: 01/31/1986, 29 y.o.   MRN: [id number]  HPI Patient  In today for follow up- She is doing ok. Saw neurologist yesterday and he increased her diamox because hse has been having seizures. She is still waiting for another rheumatology  Referral. The neurologist wants her to see intergrated medicine and they are working on that for her now. She is having a good day today.  Patient Active Problem List   Diagnosis Date Noted  . Tachycardia, paroxysmal 12/29/2014  . 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 Lupus (systemic lupus erythematosus) 08/29/2014  . Dyspnea 04/10/2014  . Fibromyalgia 12/14/2013  . Muscle spasticity 11/14/2013  . Thoracic back pain 11/14/2013  . Lumbago 11/14/2013  . Quadriplegia 01/10/2013  . Hyperlipidemia 08/26/2012  . IBS (irritable bowel syndrome) 01/22/2012  . Depression 01/22/2012  . Bronchitis, chronic/intermittent 01/22/2012  . Abdominal pain, generalized 01/22/2012  . Back pain, lumbosacral 01/22/2012  . GERD (gastroesophageal reflux disease)   . Seizures   . Thyroid disease    Outpatient Encounter Prescriptions as of 03/29/2015  Medication Sig  . acetaZOLAMIDE (DIAMOX) 250 MG tablet Take 250 mg by mouth 3 (three) times daily. Take 250 mg two times a day; then 500 mg at bedtime  . albuterol (PROVENTIL HFA;VENTOLIN HFA) 108 (90 BASE) MCG/ACT inhaler Inhale 2 puffs into the lungs every 6 (six) hours as needed for wheezing or shortness of breath.  Marland Kitchen amitriptyline (ELAVIL) 50 MG tablet TAKE ONE TABLET DAILY AT BEDTIME  . atorvastatin (LIPITOR) 40 MG tablet TAKE ONE (1) TABLET EACH DAY  . baclofen (LIORESAL) 20 MG tablet TAKE ONE TABLET FOUR TIMES DAILY  . Beclomethasone Dipropionate (QNASL CHILDRENS) 40 MCG/ACT AERS Place into the nose.  . Calcium-Magnesium-Vitamin D (CALCIUM 1200+D3 PO) Take 1 tablet by mouth daily.  . cetirizine (ZYRTEC) 10 MG tablet Take 10 mg by mouth daily.  .  Cholecalciferol (VITAMIN D) 2000 UNITS tablet Take 2,000 Units by mouth daily.  . cycloSPORINE (RESTASIS) 0.05 % ophthalmic emulsion Place 1 drop into both eyes 2 (two) times daily.  Marland Kitchen docusate sodium (COLACE) 100 MG capsule Take 100 mg by mouth 2 (two) times daily. PRN  . EPINEPHrine (EPI-PEN) 0.3 mg/0.3 mL SOAJ injection Inject 0.3 mg into the muscle as needed.  . furosemide (LASIX) 20 MG tablet Take 1 tablet (20 mg total) by mouth daily.  Marland Kitchen gabapentin (NEURONTIN) 300 MG capsule One capsule three times at day and at Bedtime take two capsules  . ipratropium-albuterol (DUONEB) 0.5-2.5 (3) MG/3ML SOLN Take 3 mLs by nebulization every 4 (four) hours as needed.  Marland Kitchen L-Methylfolate (DEPLIN) 15 MG TABS Take by mouth.  . lidocaine (LIDODERM) 5 % Place 3 patches onto the skin daily. Remove & Discard patch within 12 hours or as directed by MD  . medroxyPROGESTERone (DEPO-PROVERA) 150 MG/ML injection Inject 150 mg into the muscle as directed. Every 2 months  . meloxicam (MOBIC) 15 MG tablet TAKE ONE (1) TABLET EACH DAY  . metoprolol succinate (TOPROL-XL) 25 MG 24 hr tablet Take 1 tablet (25 mg total) by mouth daily.  . montelukast (SINGULAIR) 10 MG tablet TAKE ONE TABLET DAILY AT BEDTIME  . Omega 3 1000 MG CAPS Take 1 capsule by mouth daily.  Marland Kitchen omeprazole (PRILOSEC) 40 MG capsule TAKE ONE (1) CAPSULE EACH DAY  . oxyCODONE-acetaminophen (PERCOCET) 7.5-325 MG per tablet One tablet every 6 hours as needed. No More Than 4 a day  .  predniSONE (DELTASONE) 5 MG tablet Take 1 tablet (5 mg total) by mouth daily with breakfast.  . promethazine (PHENERGAN) 12.5 MG tablet Take 1 tablet (12.5 mg total) by mouth every 8 (eight) hours as needed for nausea or vomiting.  . vitamin E (VITAMIN E) 400 UNIT capsule Take 400 Units by mouth daily.   No facility-administered encounter medications on file as of 03/29/2015.       Review of Systems  Constitutional: Positive for appetite change and fatigue.  HENT: Negative.     Respiratory: Negative.   Cardiovascular: Positive for leg swelling.  Gastrointestinal: Negative.   Endocrine: Negative.   Genitourinary: Negative.   Neurological: Positive for dizziness and light-headedness.  Psychiatric/Behavioral: Negative.   All other systems reviewed and are negative.      Objective:   Physical Exam  Constitutional: She is oriented to person, place, and time. She appears well-developed and well-nourished. No distress.  HENT:  Right Ear: External ear normal.  Left Ear: External ear normal.  Nose: Nose normal.  Mouth/Throat: Oropharynx is clear and moist.  Eyes: Pupils are equal, round, and reactive to light.  Neck: Normal range of motion. Neck supple.  Cardiovascular: Normal rate, regular rhythm and normal heart sounds.   Pulmonary/Chest: Effort normal and breath sounds normal.  Abdominal: Soft. Bowel sounds are normal.  Musculoskeletal:  bil lower ext weakness  Neurological: She is alert and oriented to person, place, and time. She has normal reflexes. No cranial nerve deficit.  Skin: Skin is warm and dry.  Psychiatric: She has a normal mood and affect. Her behavior is normal. Judgment and thought content normal.    BP 120/79 mmHg  Pulse 87  Temp(Src) 97.5 F (36.4 C) (Oral)  Ht 5\' 8"  (1.727 m)  Wt 218 lb 9.6 oz (99.156 kg)  BMI 33.25 kg/m2       Assessment & Plan:   1. Hyperlipidemia   2. Thyroid disease   3. IBS (irritable bowel syndrome)   4. Gastroesophageal reflux disease without esophagitis   5. Fibromyalgia   6. Dyspnea   7. Depression   8. Inflammatory autoimmune disorder    Orders Placed This Encounter  Procedures  . CMP14+EGFR  . NMR, lipoprofile  . Thyroid Panel With TSH   Keep follow up appointments with all specialists- anxious to here from integrative medicine doctor Continue all meds Exercise as can  Mary-Margaret Daphine Deutscher, FNP

## 2015-03-29 NOTE — Patient Instructions (Signed)
Fat and Cholesterol Control Diet Fat and cholesterol levels in your blood and organs are influenced by your diet. High levels of fat and cholesterol may lead to diseases of the heart, small and large blood vessels, gallbladder, liver, and pancreas. CONTROLLING FAT AND CHOLESTEROL WITH DIET Although exercise and lifestyle factors are important, your diet is key. That is because certain foods are known to raise cholesterol and others to lower it. The goal is to balance foods for their effect on cholesterol and more importantly, to replace saturated and trans fat with other types of fat, such as monounsaturated fat, polyunsaturated fat, and omega-3 fatty acids. On average, a person should consume no more than 15 to 17 g of saturated fat daily. Saturated and trans fats are considered "bad" fats, and they will raise LDL cholesterol. Saturated fats are primarily found in animal products such as meats, butter, and cream. However, that does not mean you need to give up all your favorite foods. Today, there are good tasting, low-fat, low-cholesterol substitutes for most of the things you like to eat. Choose low-fat or nonfat alternatives. Choose round or loin cuts of red meat. These types of cuts are lowest in fat and cholesterol. Chicken (without the skin), fish, veal, and ground turkey breast are great choices. Eliminate fatty meats, such as hot dogs and salami. Even shellfish have little or no saturated fat. Have a 3 oz (85 g) portion when you eat lean meat, poultry, or fish. Trans fats are also called "partially hydrogenated oils." They are oils that have been scientifically manipulated so that they are solid at room temperature resulting in a longer shelf life and improved taste and texture of foods in which they are added. Trans fats are found in stick margarine, some tub margarines, cookies, crackers, and baked goods.  When baking and cooking, oils are a great substitute for butter. The monounsaturated oils are  especially beneficial since it is believed they lower LDL and raise HDL. The oils you should avoid entirely are saturated tropical oils, such as coconut and palm.  Remember to eat a lot from food groups that are naturally free of saturated and trans fat, including fish, fruit, vegetables, beans, grains (barley, rice, couscous, bulgur wheat), and pasta (without cream sauces).  IDENTIFYING FOODS THAT LOWER FAT AND CHOLESTEROL  Soluble fiber may lower your cholesterol. This type of fiber is found in fruits such as apples, vegetables such as broccoli, potatoes, and carrots, legumes such as beans, peas, and lentils, and grains such as barley. Foods fortified with plant sterols (phytosterol) may also lower cholesterol. You should eat at least 2 g per day of these foods for a cholesterol lowering effect.  Read package labels to identify low-saturated fats, trans fat free, and low-fat foods at the supermarket. Select cheeses that have only 2 to 3 g saturated fat per ounce. Use a heart-healthy tub margarine that is free of trans fats or partially hydrogenated oil. When buying baked goods (cookies, crackers), avoid partially hydrogenated oils. Breads and muffins should be made from whole grains (whole-wheat or whole oat flour, instead of "flour" or "enriched flour"). Buy non-creamy canned soups with reduced salt and no added fats.  FOOD PREPARATION TECHNIQUES  Never deep-fry. If you must fry, either stir-fry, which uses very little fat, or use non-stick cooking sprays. When possible, broil, bake, or roast meats, and steam vegetables. Instead of putting butter or margarine on vegetables, use lemon and herbs, applesauce, and cinnamon (for squash and sweet potatoes). Use nonfat   yogurt, salsa, and low-fat dressings for salads.  LOW-SATURATED FAT / LOW-FAT FOOD SUBSTITUTES Meats / Saturated Fat (g)  Avoid: Steak, marbled (3 oz/85 g) / 11 g  Choose: Steak, lean (3 oz/85 g) / 4 g  Avoid: Hamburger (3 oz/85 g) / 7  g  Choose: Hamburger, lean (3 oz/85 g) / 5 g  Avoid: Ham (3 oz/85 g) / 6 g  Choose: Ham, lean cut (3 oz/85 g) / 2.4 g  Avoid: Chicken, with skin, dark meat (3 oz/85 g) / 4 g  Choose: Chicken, skin removed, dark meat (3 oz/85 g) / 2 g  Avoid: Chicken, with skin, light meat (3 oz/85 g) / 2.5 g  Choose: Chicken, skin removed, light meat (3 oz/85 g) / 1 g Dairy / Saturated Fat (g)  Avoid: Whole milk (1 cup) / 5 g  Choose: Low-fat milk, 2% (1 cup) / 3 g  Choose: Low-fat milk, 1% (1 cup) / 1.5 g  Choose: Skim milk (1 cup) / 0.3 g  Avoid: Hard cheese (1 oz/28 g) / 6 g  Choose: Skim milk cheese (1 oz/28 g) / 2 to 3 g  Avoid: Cottage cheese, 4% fat (1 cup) / 6.5 g  Choose: Low-fat cottage cheese, 1% fat (1 cup) / 1.5 g  Avoid: Ice cream (1 cup) / 9 g  Choose: Sherbet (1 cup) / 2.5 g  Choose: Nonfat frozen yogurt (1 cup) / 0.3 g  Choose: Frozen fruit bar / trace  Avoid: Whipped cream (1 tbs) / 3.5 g  Choose: Nondairy whipped topping (1 tbs) / 1 g Condiments / Saturated Fat (g)  Avoid: Mayonnaise (1 tbs) / 2 g  Choose: Low-fat mayonnaise (1 tbs) / 1 g  Avoid: Butter (1 tbs) / 7 g  Choose: Extra light margarine (1 tbs) / 1 g  Avoid: Coconut oil (1 tbs) / 11.8 g  Choose: Olive oil (1 tbs) / 1.8 g  Choose: Corn oil (1 tbs) / 1.7 g  Choose: Safflower oil (1 tbs) / 1.2 g  Choose: Sunflower oil (1 tbs) / 1.4 g  Choose: Soybean oil (1 tbs) / 2.4 g  Choose: Canola oil (1 tbs) / 1 g Document Released: 11/03/2005 Document Revised: 02/28/2013 Document Reviewed: 02/01/2014 [employer] Patient Information 2015 [employer]. This information is not intended to replace advice given to you by your health care provider. Make sure you discuss any questions you have with your health care provider.  

## 2015-03-30 LAB — CMP14+EGFR
ALK PHOS: 103 IU/L (ref 39–117)
ALT: 14 IU/L (ref 0–32)
AST: 12 IU/L (ref 0–40)
Albumin/Globulin Ratio: 1.8 (ref 1.1–2.5)
Albumin: 4.2 g/dL (ref 3.5–5.5)
BUN/Creatinine Ratio: 13 (ref 8–20)
BUN: 10 mg/dL (ref 6–20)
Bilirubin Total: 0.3 mg/dL (ref 0.0–1.2)
CO2: 21 mmol/L (ref 18–29)
Calcium: 9.6 mg/dL (ref 8.7–10.2)
Chloride: 106 mmol/L (ref 97–108)
Creatinine, Ser: 0.76 mg/dL (ref 0.57–1.00)
GFR calc Af Amer: 121 mL/min/{1.73_m2} (ref >59)
GFR, EST NON AFRICAN AMERICAN: 105 mL/min/{1.73_m2} (ref >59)
Globulin, Total: 2.3 g/dL (ref 1.5–4.5)
Glucose: 98 mg/dL (ref 65–99)
Potassium: 3.5 mmol/L (ref 3.5–5.2)
Sodium: 145 mmol/L — ABNORMAL HIGH (ref 134–144)
Total Protein: 6.5 g/dL (ref 6.0–8.5)

## 2015-03-30 LAB — NMR, LIPOPROFILE
CHOLESTEROL: 189 mg/dL (ref 100–199)
HDL CHOLESTEROL BY NMR: 52 mg/dL (ref >39)
HDL Particle Number: 32 umol/L (ref >=30.5)
LDL Particle Number: 1387 nmol/L — ABNORMAL HIGH (ref <1000)
LDL SIZE: 20.7 nm (ref >20.5)
LDL-C: 80 mg/dL (ref 0–99)
LP-IR Score: 47 — ABNORMAL HIGH (ref <=45)
Small LDL Particle Number: 487 nmol/L (ref <=527)
Triglycerides by NMR: 286 mg/dL — ABNORMAL HIGH (ref 0–149)

## 2015-03-30 LAB — THYROID PANEL WITH TSH
FREE THYROXINE INDEX: 1.8 (ref 1.2–4.9)
T3 UPTAKE RATIO: 26 % (ref 24–39)
T4, Total: 7 ug/dL (ref 4.5–12.0)
TSH: 2.02 u[IU]/mL (ref 0.450–4.500)

## 2015-04-04 NOTE — Progress Notes (Signed)
Urine drug screen for this encounter is consistent for prescribed medication 

## 2015-04-18 ENCOUNTER — Encounter: Payer: Self-pay | Admitting: Registered Nurse

## 2015-04-18 ENCOUNTER — Encounter: Payer: BLUE CROSS/BLUE SHIELD | Attending: Physical Medicine & Rehabilitation | Admitting: Registered Nurse

## 2015-04-18 VITALS — BP 135/70 | HR 89 | Resp 14

## 2015-04-18 DIAGNOSIS — M6249 Contracture of muscle, multiple sites: Secondary | ICD-10-CM | POA: Diagnosis not present

## 2015-04-18 DIAGNOSIS — Z5181 Encounter for therapeutic drug level monitoring: Secondary | ICD-10-CM

## 2015-04-18 DIAGNOSIS — R0781 Pleurodynia: Secondary | ICD-10-CM

## 2015-04-18 DIAGNOSIS — M797 Fibromyalgia: Secondary | ICD-10-CM | POA: Diagnosis not present

## 2015-04-18 DIAGNOSIS — R569 Unspecified convulsions: Secondary | ICD-10-CM

## 2015-04-18 DIAGNOSIS — R413 Other amnesia: Secondary | ICD-10-CM

## 2015-04-18 DIAGNOSIS — G894 Chronic pain syndrome: Secondary | ICD-10-CM

## 2015-04-18 DIAGNOSIS — M329 Systemic lupus erythematosus, unspecified: Secondary | ICD-10-CM | POA: Insufficient documentation

## 2015-04-18 DIAGNOSIS — M545 Low back pain: Secondary | ICD-10-CM

## 2015-04-18 DIAGNOSIS — M62838 Other muscle spasm: Secondary | ICD-10-CM

## 2015-04-18 DIAGNOSIS — Z79899 Other long term (current) drug therapy: Secondary | ICD-10-CM

## 2015-04-18 DIAGNOSIS — G609 Hereditary and idiopathic neuropathy, unspecified: Secondary | ICD-10-CM

## 2015-04-18 MED ORDER — OXYCODONE-ACETAMINOPHEN 7.5-325 MG PO TABS: ORAL_TABLET | ORAL | Status: DC

## 2015-04-18 NOTE — Progress Notes (Signed)
Subjective:    Patient ID: Autumn Davis, female    DOB: 08-27-1986, 29 y.o.   MRN: [id number]  HPI: Autumn Davis is a 29 year old female who returns for follow up for chronic pain and medication refill. She says her pain is in her rib cage on the right side. Also stated she's having increase intensity of burning in her feet. We will increase her gabapentin today she verbalizes understanding. She rates her pain 9. Her current exercise regime is performing stretching exercises with bands and chair exercises. She  heard from Dr. Link Snuffer office they won't be able to see her. She's in the process of trying to find another rheumatologist with the assistance from her neurologist.Also states her opthomologist says her peripheral vision is narrowing and she has increase inflammation in her left eye she has been diagnosed with optic neuritis.  Autumn Davis has noticed she's experiencing at times difficulty with word finding she will be following with her neurologist she has been encouraged to call she verbalizes understanding. Short term recall intact.   Pain Inventory Average Pain 8 Pain Right Now 9 My pain is constant, sharp, burning, dull, stabbing, tingling and aching  In the last 24 hours, has pain interfered with the following? General activity 6 Relation with others 7 Enjoyment of life 7 What TIME of day is your pain at its worst? evening, night Sleep (in general) Poor  Pain is worse with: walking, bending, sitting, inactivity and standing Pain improves with: rest, heat/ice, therapy/exercise, medication and TENS Relief from Meds: 3  Mobility walk without assistance walk with assistance use a cane use a walker ability to climb steps?  no do you drive?  no use a wheelchair transfers alone Do you have any goals in this area?  yes  Function disabled: date disabled . I need assistance with the following:  dressing, meal prep, household duties and shopping Do you have  any goals in this area?  yes  Neuro/Psych bladder control problems weakness numbness tremor tingling trouble walking spasms dizziness  Prior Studies Any changes since last visit?  no  Physicians involved in your care Any changes since last visit?  no   Family History  Problem Relation Age of Onset  . Thyroid disease Mother   . Cancer Maternal Grandmother     lung  . Cancer Paternal Grandmother     colon/pancreatiec/lymphoma   History   Social History  . Marital Status: Married    Spouse Name: N/A  . Number of Children: N/A  . Years of Education: N/A   Occupational History  . disabled    Social History Main Topics  . Smoking status: Never Smoker   . Smokeless tobacco: Never Used  . Alcohol Use: No  . Drug Use: No  . Sexual Activity: Not on file   Other Topics Concern  . None   Social History Narrative   Past Surgical History  Procedure Laterality Date  . Knee surgery  2002/2003    bil  . Rectal surgery  correction of prolapse    2010   Past Medical History  Diagnosis Date  . GERD (gastroesophageal reflux disease)   . Hyperlipidemia   . Seizures   . Thyroid disease   . Bronchitis, chronic/intermittent 01/22/2012  . Depression 01/22/2012  . IBS (irritable bowel syndrome) 01/22/2012  . Neuromuscular disorder   . SOB (shortness of breath)    BP 135/70 mmHg  Pulse 89  Resp 14  SpO2 98%  Opioid Risk Score:   Fall Risk Score: Moderate Fall Risk (6-13 points)`1  Depression screen PHQ 2/9  Depression screen [facility] 2/9 03/29/2015 02/16/2015 03/13/2014  Decreased Interest 1 1 1   Down, Depressed, Hopeless 0 0 1  PHQ - 2 Score 1 1 2   Altered sleeping - 3 0  Tired, decreased energy - 1 0  Change in appetite - 1 0  Feeling bad or failure about yourself  - 0 0  Trouble concentrating - 1 0  Moving slowly or fidgety/restless - 0 0  Suicidal thoughts - 0 0  PHQ-9 Score - 7 2     Review of Systems  Musculoskeletal: Positive for gait problem.    Neurological: Positive for dizziness, tremors, weakness and numbness.       Tingling Spasms   All other systems reviewed and are negative.      Objective:   Physical Exam  Constitutional: She is oriented to person, place, and time. She appears well-developed and well-nourished.  HENT:  Head: Normocephalic and atraumatic.  Neck: Normal range of motion. Neck supple.  Cardiovascular: Normal rate and regular rhythm.   Pulmonary/Chest: Effort normal and breath sounds normal.  Musculoskeletal:  Normal Muscle Bulk and Muscle Testing Reveals: Upper Extremities: Full ROM and Muscle Strength right 5/5 and Left 4/5 Thoracic Paraspinal Tenderness: T-5- T-7 Lumbar Paraspinal Tenderness: L-3- L-5 Lower Extremities: Full ROM and Muscle Strength 5/5 Arises from chair with ease using straight cane for support Narrow Based Gait  Neurological: She is alert and oriented to person, place, and time.  Skin: Skin is warm and dry.  Psychiatric: She has a normal mood and affect.  Nursing note and vitals reviewed.         Assessment & Plan:  1. Chronic seizure disorder: No seizure's. Continue Gabapentin. Neurology Following  2. Chronic muscle spasms, weakness with associated pain disorder: Continue Baclofen and MOBIC. Continue with Exercise regime. 3. Chronic dysphagia: GI Following  4. Anxiety with depression : Stable. Continue Elavil  5. Fibromyalgia/ Chronic Pain: Continue with exercise and heat Therapy.  Refilled: oxyCODONE 7.5/325mg  one tablet every 6 hours as needed #120.  6. Memory Disturbance: F/U with Neurology 7. Peripheral Neuropathy: RX: Increased Gabapentin  30 minutes of face to face patient care time was spent during this visit. All questions were encouraged and answered

## 2015-04-19 ENCOUNTER — Telehealth: Payer: Self-pay | Admitting: *Deleted

## 2015-04-19 NOTE — Telephone Encounter (Signed)
Baird LyonsCasey called to let Riley Lamunice know that there is no scoring on the amitriptyline so it cannot be cut and it is due to be refilled anyway.  She also took the extra gabapentin and can't tell for sure but she believes it might be helping.

## 2015-04-20 ENCOUNTER — Telehealth: Payer: Self-pay | Admitting: Registered Nurse

## 2015-04-20 MED ORDER — AMITRIPTYLINE HCL 75 MG PO TABS: 75.0000 mg | ORAL_TABLET | Freq: Every day | ORAL | Status: DC

## 2015-04-20 NOTE — Telephone Encounter (Signed)
Left vm on name identified vm (mobile) that we have ordered the increased strength amitriptyline 75 mg daily.

## 2015-04-20 NOTE — Telephone Encounter (Signed)
Spoke to Autumn Davis, Gabapentin was increased last visit due to increase intensity of nerve pain. She's tolerating Gabapentin 600 mg in the morning and 300 mg in the after noon and evening  600 mg at bedtime. She's tolerating the changed. Elavil was increased to 75 mg today due to insomnia. She will call on Monday 04/23/15 to evaluate medication changes, she verbalizes understanding.

## 2015-05-16 ENCOUNTER — Encounter (HOSPITAL_BASED_OUTPATIENT_CLINIC_OR_DEPARTMENT_OTHER): Payer: BLUE CROSS/BLUE SHIELD | Admitting: Physical Medicine & Rehabilitation

## 2015-05-16 ENCOUNTER — Encounter: Payer: Self-pay | Admitting: Physical Medicine & Rehabilitation

## 2015-05-16 VITALS — BP 119/55 | HR 86 | Resp 14

## 2015-05-16 DIAGNOSIS — M6249 Contracture of muscle, multiple sites: Secondary | ICD-10-CM

## 2015-05-16 DIAGNOSIS — M797 Fibromyalgia: Secondary | ICD-10-CM | POA: Diagnosis not present

## 2015-05-16 DIAGNOSIS — L930 Discoid lupus erythematosus: Secondary | ICD-10-CM

## 2015-05-16 DIAGNOSIS — D8989 Other specified disorders involving the immune mechanism, not elsewhere classified: Secondary | ICD-10-CM

## 2015-05-16 DIAGNOSIS — M545 Low back pain: Secondary | ICD-10-CM | POA: Diagnosis not present

## 2015-05-16 DIAGNOSIS — M5441 Lumbago with sciatica, right side: Secondary | ICD-10-CM

## 2015-05-16 DIAGNOSIS — M62838 Other muscle spasm: Secondary | ICD-10-CM

## 2015-05-16 DIAGNOSIS — R569 Unspecified convulsions: Secondary | ICD-10-CM

## 2015-05-16 DIAGNOSIS — M329 Systemic lupus erythematosus, unspecified: Secondary | ICD-10-CM | POA: Diagnosis not present

## 2015-05-16 DIAGNOSIS — M5442 Lumbago with sciatica, left side: Secondary | ICD-10-CM

## 2015-05-16 MED ORDER — GABAPENTIN 600 MG PO TABS: 600.0000 mg | ORAL_TABLET | Freq: Four times a day (QID) | ORAL | Status: DC

## 2015-05-16 MED ORDER — OXYCODONE-ACETAMINOPHEN 7.5-325 MG PO TABS: ORAL_TABLET | ORAL | Status: DC

## 2015-05-16 NOTE — Patient Instructions (Signed)
WORK ON LEG STRETCHING, TRUNK STRETCHING. USE HEAT/ICE AS WELL.

## 2015-05-16 NOTE — Progress Notes (Signed)
Subjective:    Patient ID: Autumn Davis, female    DOB: 14-May-1986, 29 y.o.   MRN: [id number]  HPI  Case is here in follow up of her chronic pain. She still hasn't seen rheumatology. Her neurologist at [facility] mentioned a rheum or integrative medicine person that she could be referred to. She continues to have burning and tingling especially on her left side. She complains of tightness in her chest which radiates across from left to right.   Overall her QOL has improved with the changes we've made in her regimen. She is doing more at home and in the community.   Percocet helps control her general pain. The gabapentin seems to help the dysesthesias in particular. The burning pain is worst at night. Sleep is often affected. Sleep is better, however, with the elavil.   Mood has been good. Family remains supportive.        Pain Inventory Average Pain 9 Pain Right Now 7 My pain is constant, sharp, burning, dull, stabbing, tingling and aching  In the last 24 hours, has pain interfered with the following? General activity 7 Relation with others 7 Enjoyment of life 7 What TIME of day is your pain at its worst? evening, night Sleep (in general) Poor  Pain is worse with: walking, bending, sitting, standing and some activites Pain improves with: rest, heat/ice, therapy/exercise, medication and TENS Relief from Meds: 4  Mobility walk without assistance walk with assistance use a cane use a walker do you drive?  no use a wheelchair Do you have any goals in this area?  yes  Function disabled: date disabled . I need assistance with the following:  dressing, meal prep, household duties and shopping  Neuro/Psych bladder control problems weakness numbness tremor tingling trouble walking spasms  Prior Studies Any changes since last visit?  no  Physicians involved in your care Any changes since last visit?  no   Family History  Problem Relation Age of Onset  .  Thyroid disease Mother   . Cancer Maternal Grandmother     lung  . Cancer Paternal Grandmother     colon/pancreatiec/lymphoma   History   Social History  . Marital Status: Married    Spouse Name: N/A  . Number of Children: N/A  . Years of Education: N/A   Occupational History  . disabled    Social History Main Topics  . Smoking status: Never Smoker   . Smokeless tobacco: Never Used  . Alcohol Use: No  . Drug Use: No  . Sexual Activity: Not on file   Other Topics Concern  . None   Social History Narrative   Past Surgical History  Procedure Laterality Date  . Knee surgery  2002/2003    bil  . Rectal surgery  correction of prolapse    2010   Past Medical History  Diagnosis Date  . GERD (gastroesophageal reflux disease)   . Hyperlipidemia   . Seizures   . Thyroid disease   . Bronchitis, chronic/intermittent 01/22/2012  . Depression 01/22/2012  . IBS (irritable bowel syndrome) 01/22/2012  . Neuromuscular disorder   . SOB (shortness of breath)    BP 119/55 mmHg  Pulse 86  Resp 14  SpO2 98%  Opioid Risk Score:   Fall Risk Score: Moderate Fall Risk (6-13 points)`1  Depression screen PHQ 2/9  Depression screen [facility] 2/9 03/29/2015 02/16/2015 03/13/2014  Decreased Interest 1 1 1   Down, Depressed, Hopeless 0 0 1  PHQ - 2 Score  1 1 2   Altered sleeping - 3 0  Tired, decreased energy - 1 0  Change in appetite - 1 0  Feeling bad or failure about yourself  - 0 0  Trouble concentrating - 1 0  Moving slowly or fidgety/restless - 0 0  Suicidal thoughts - 0 0  PHQ-9 Score - 7 2     Review of Systems  Genitourinary:       Bladder control problems  Musculoskeletal: Positive for gait problem.  Neurological: Positive for tremors, weakness and numbness.       Tingling Spasms   All other systems reviewed and are negative.      Objective:   Physical Exam  General: Alert and oriented x 3, No apparent distress. Has gained some weight.  HEENT: Head is normocephalic,  atraumatic, PERRLA, EOMI, sclera anicteric, oral mucosa pink and moist, dentition intact, ext ear canals clear,  Neck: Supple without JVD or lymphadenopathy  Heart: Reg rate and rhythm. No murmurs rubs or gallops  Chest: CTA bilaterally without wheezes, rales, or rhonchi; no distress  Abdomen: Soft, non-tender, non-distended, bowel sounds positive.  Extremities: No clubbing, cyanosis, or edema. Pulses are 2+  Skin: Clean and intact without signs of breakdown. She had a malar rash over the neck.  Neuro: Pt displays generally intact insight and awareness. CN exam appears intact without focal abnormalities today. She has good vocal quality, cough. Vision intact. Strength is grossly 4 to 4+/5 in the upper limbs with improved ROM noted--  Sensation is decreased below the waist bilaterally, distal more than proximal to PP/LT--unchanged. She has decreased proprioception in both legs as well. Muscle tightness/tone appears decreased today. DTR's remain today in general at 1+. She walked without a cane but favors the left leg.  Musculoskeletal: She has generalized joint pain but now swelling or gross abnormalities. She has a lot of tenderness in the right lower rib cage/axillary line. Left achilles tendon quite taut/tender Psych: appears more alert. Pleasant and appropriate.   Assessment & Plan:   1. Chronic seizure disorder---? myoclonic jerks/myotonia/ or some other abnormal muscle abnormality  2. 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  3. Chronic dysphagia  4. Right low back/flank pain  5. Fibromyalgia syndrome  6. Anxiety with depression  7. Pulmonary function abnormalities., ?CO2 retention? ---recent normal ECHO from 09/15/14    Plan:  1. Elavil at 75mg  qhs  2. Maintain baclofen to 20mg  for muscle spasms. Still can consider klonopin trial in the future. Needs to work on daily stretching and ROM program.  3. Still would  like a rheum referral to for overall re-assessment. She is seeing an "integrative specialist" to hopefully look at tieing some things together.  She has been doing extremely well since being on the low dose prednisone, although I don't want to keep her on this indefinitely without some type of inflammatory/autoimmune dx.  4. Continue meloxicam 15mg  daily  5. Oxycodone 7.5mg  fr breakthrough pain #120.  6. Increase Gabapentin  600mg  QID for seizures, spasms, neuro pain  7. Continue lidoderm patches for trunk/back pain.  8. Follow up with NP or me in about one month. 30 minutes of face to face patient care time were spent during this visit. All questions were encouraged and answered.

## 2015-05-19 ENCOUNTER — Other Ambulatory Visit: Payer: Self-pay | Admitting: Registered Nurse

## 2015-05-29 ENCOUNTER — Other Ambulatory Visit: Payer: Self-pay | Admitting: Nurse Practitioner

## 2015-06-12 ENCOUNTER — Encounter: Payer: Self-pay | Admitting: Physical Medicine & Rehabilitation

## 2015-06-12 ENCOUNTER — Encounter: Payer: Medicare Other | Attending: Physical Medicine & Rehabilitation | Admitting: Physical Medicine & Rehabilitation

## 2015-06-12 VITALS — BP 128/74 | HR 93 | Resp 16

## 2015-06-12 DIAGNOSIS — M797 Fibromyalgia: Secondary | ICD-10-CM | POA: Insufficient documentation

## 2015-06-12 DIAGNOSIS — G8250 Quadriplegia, unspecified: Secondary | ICD-10-CM | POA: Diagnosis not present

## 2015-06-12 DIAGNOSIS — M329 Systemic lupus erythematosus, unspecified: Secondary | ICD-10-CM | POA: Diagnosis not present

## 2015-06-12 DIAGNOSIS — L930 Discoid lupus erythematosus: Secondary | ICD-10-CM | POA: Diagnosis not present

## 2015-06-12 DIAGNOSIS — D8989 Other specified disorders involving the immune mechanism, not elsewhere classified: Secondary | ICD-10-CM

## 2015-06-12 DIAGNOSIS — R569 Unspecified convulsions: Secondary | ICD-10-CM | POA: Diagnosis not present

## 2015-06-12 DIAGNOSIS — M545 Low back pain: Secondary | ICD-10-CM | POA: Diagnosis not present

## 2015-06-12 DIAGNOSIS — M62838 Other muscle spasm: Secondary | ICD-10-CM

## 2015-06-12 DIAGNOSIS — M6249 Contracture of muscle, multiple sites: Secondary | ICD-10-CM | POA: Insufficient documentation

## 2015-06-12 MED ORDER — OXYCODONE-ACETAMINOPHEN 7.5-325 MG PO TABS: ORAL_TABLET | ORAL | Status: DC

## 2015-06-12 NOTE — Patient Instructions (Signed)
CONTINUE TO WORK ON YOUR EXERCISE TOLERANCE AND STAMINA

## 2015-06-12 NOTE — Progress Notes (Signed)
Subjective:    Patient ID: Autumn Davis, female    DOB: 04/28/1986, 29 y.o.   MRN: [id number]  HPI   Florian is here in follow up of her chronic pain. She feels that he leg pain is better but she has noticed some mottling of  skin over right and left flanks as well as the legs.  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 She confirms she has been using more but denies falling asleep on, or having heat too high.  Her spasms have been noticeable in the back and "ribs"---if she notices them coming on, she works on a stretch to counteract them.   She is walking more. She can walk through [place] or the grocery store without having to stop. She is active also with a child the family has at home as a "foster" child of sorts.      Pain Inventory Average Pain 8 Pain Right Now 9 My pain is constant, sharp, burning, stabbing, tingling and aching  In the last 24 hours, has pain interfered with the following? General activity 8 Relation with others 8 Enjoyment of life 8 What TIME of day is your pain at its worst? evening and night Sleep (in general) Poor  Pain is worse with: walking, bending, sitting, standing and some activites Pain improves with: rest, heat/ice, therapy/exercise, pacing activities, medication and injections Relief from Meds: 2  Mobility walk without assistance walk with assistance use a cane use a walker do you drive?  no use a wheelchair transfers alone  Function I need assistance with the following:  dressing, meal prep, household duties and shopping  Neuro/Psych bladder control problems weakness numbness tremor tingling trouble walking spasms dizziness  Prior Studies Any changes since last visit?  no  Physicians involved in your care Any changes since last visit?  no   Family History  Problem Relation Age of Onset  . Thyroid disease Mother   . Cancer Maternal Grandmother     lung  . Cancer Paternal Grandmother     colon/pancreatiec/lymphoma   History   Social History  . Marital Status: Married    Spouse Name: N/A  . Number of Children: N/A  . Years of Education: N/A   Occupational History  . disabled    Social History Main Topics  . Smoking status: Never Smoker   . Smokeless tobacco: Never Used  . Alcohol Use: No  . Drug Use: No  . Sexual Activity: Not on file   Other Topics Concern  . None   Social History Narrative   Past Surgical History  Procedure Laterality Date  . Knee surgery  2002/2003    bil  . Rectal surgery  correction of prolapse    2010   Past Medical History  Diagnosis Date  . GERD (gastroesophageal reflux disease)   . Hyperlipidemia   . Seizures   . Thyroid disease   . Bronchitis, chronic/intermittent 01/22/2012  . Depression 01/22/2012  . IBS (irritable bowel syndrome) 01/22/2012  . Neuromuscular disorder   . SOB (shortness of breath)    BP 128/74 mmHg  Pulse 93  Resp 16  SpO2 96%  Opioid Risk Score:   Fall Risk Score:  `1  Depression screen PHQ 2/9  Depression screen [facility] 2/9 06/12/2015 03/29/2015 02/16/2015 03/13/2014  Decreased Interest 1 1 1 1   Down, Depressed, Hopeless 0 0 0 1  PHQ - 2 Score 1 1 1 2   Altered sleeping - - 3 0  Tired, decreased energy - - 1 0  Change in appetite - - 1 0  Feeling bad or failure about yourself  - - 0 0  Trouble concentrating - - 1 0  Moving slowly or fidgety/restless - - 0 0  Suicidal thoughts - - 0 0  PHQ-9 Score - - 7 2     Review of Systems  Genitourinary:       Bladder control problems  Musculoskeletal: Positive for gait problem.       Spasms  Neurological: Positive for dizziness, tremors, weakness and numbness.       Tingling  All other systems reviewed and are negative.      Objective:   Physical Exam General: Alert and oriented x 3, No apparent distress. Has gained some weight.  HEENT: Head is normocephalic, atraumatic, PERRLA, EOMI, sclera anicteric, oral mucosa pink and moist, dentition intact,  ext ear canals clear,  Neck: Supple without JVD or lymphadenopathy  Heart: Reg rate and rhythm. No murmurs rubs or gallops  Chest: CTA bilaterally without wheezes, rales, or rhonchi; no distress  Abdomen: Soft, non-tender, non-distended, bowel sounds positive.  Extremities: No clubbing, cyanosis, or edema. Pulses are 2+  Skin: Clean and intact without signs of breakdown. She has some mottling over the skin of the lower back---no rash,breakdown, non-tender. Neuro: Pt displays generally intact insight and awareness. CN exam appears intact without focal abnormalities today. She has good vocal quality, cough. Vision intact. Strength is grossly 4 to 4+/5 in the upper limbs with improved ROM noted-- Sensation is decreased below the waist bilaterally, distal more than proximal to PP/LT--unchanged. She has decreased proprioception in both legs as well. Muscle tightness/tone appears normal today. DTR's remain today in general at 1+. She still favors the left leg with weight bearing.  Musculoskeletal: She has generalized joint pain but now swelling or gross abnormalities. She has ongoing, non-specific tenderness in the right lower rib cage/axillary line. Left achilles tendon quite taut/tender  Psych: appears more alert. Pleasant and appropriate.   Assessment & Plan:   1. Chronic seizure disorder---? myoclonic jerks/myotonia/ or some other abnormal muscle abnormality  2. 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  3. Chronic dysphagia  4. Right low back/flank pain  5. Fibromyalgia syndrome  6. Anxiety with depression  7. Pulmonary function abnormalities., ?CO2 retention? ---recent normal ECHO from 09/15/14    Plan:  1. Continue Elavil at 75mg  qhs  2. Maintain baclofen to 20mg  for muscle spasms. Still can consider klonopin trial in the future.  Discussed the need to continue expanding her exercise capacity and aerobic ability. 3.  Still would like a rheum referral to for overall re-assessment. She is supposed to be seeing an "integrative specialist" to hopefully look at tieing some things together. Continue on low dose prednisone for now, although I don't want to keep her on this indefinitely without some type of inflammatory/autoimmune dx.  4. Continue meloxicam 15mg  daily  5. Oxycodone 7.5mg  fr breakthrough pain #120.  6. Continue Gabapentin 600mg  QID for seizures, spasms, neuro pain---this appears to have helped.  7. Continue lidoderm patches for trunk/back pain.  8. Follow up with NP or me in about one month. 15 minutes of face to face patient care time were spent during this visit. All questions were encouraged and answered.

## 2015-06-15 ENCOUNTER — Other Ambulatory Visit: Payer: Self-pay | Admitting: Nurse Practitioner

## 2015-06-15 ENCOUNTER — Other Ambulatory Visit: Payer: Self-pay | Admitting: Registered Nurse

## 2015-06-18 ENCOUNTER — Other Ambulatory Visit: Payer: Self-pay | Admitting: *Deleted

## 2015-06-18 MED ORDER — MELOXICAM 15 MG PO TABS: ORAL_TABLET | ORAL | Status: DC

## 2015-06-29 ENCOUNTER — Ambulatory Visit (INDEPENDENT_AMBULATORY_CARE_PROVIDER_SITE_OTHER): Payer: BLUE CROSS/BLUE SHIELD | Admitting: *Deleted

## 2015-06-29 DIAGNOSIS — Z309 Encounter for contraceptive management, unspecified: Secondary | ICD-10-CM

## 2015-06-29 NOTE — Patient Instructions (Signed)

## 2015-06-29 NOTE — Progress Notes (Signed)
Depo provera given and tolerated well. 

## 2015-07-13 ENCOUNTER — Encounter: Payer: BLUE CROSS/BLUE SHIELD | Attending: Physical Medicine & Rehabilitation | Admitting: Registered Nurse

## 2015-07-13 ENCOUNTER — Encounter: Payer: Self-pay | Admitting: Registered Nurse

## 2015-07-13 VITALS — BP 137/94 | HR 99

## 2015-07-13 DIAGNOSIS — M545 Low back pain: Secondary | ICD-10-CM | POA: Insufficient documentation

## 2015-07-13 DIAGNOSIS — M62838 Other muscle spasm: Secondary | ICD-10-CM

## 2015-07-13 DIAGNOSIS — M329 Systemic lupus erythematosus, unspecified: Secondary | ICD-10-CM | POA: Diagnosis not present

## 2015-07-13 DIAGNOSIS — L930 Discoid lupus erythematosus: Secondary | ICD-10-CM

## 2015-07-13 DIAGNOSIS — R569 Unspecified convulsions: Secondary | ICD-10-CM | POA: Insufficient documentation

## 2015-07-13 DIAGNOSIS — G894 Chronic pain syndrome: Secondary | ICD-10-CM

## 2015-07-13 DIAGNOSIS — M797 Fibromyalgia: Secondary | ICD-10-CM | POA: Diagnosis not present

## 2015-07-13 DIAGNOSIS — R0781 Pleurodynia: Secondary | ICD-10-CM

## 2015-07-13 DIAGNOSIS — M6249 Contracture of muscle, multiple sites: Secondary | ICD-10-CM | POA: Insufficient documentation

## 2015-07-13 DIAGNOSIS — D8989 Other specified disorders involving the immune mechanism, not elsewhere classified: Secondary | ICD-10-CM

## 2015-07-13 DIAGNOSIS — Z5181 Encounter for therapeutic drug level monitoring: Secondary | ICD-10-CM

## 2015-07-13 DIAGNOSIS — Z79899 Other long term (current) drug therapy: Secondary | ICD-10-CM

## 2015-07-13 MED ORDER — OXYCODONE-ACETAMINOPHEN 7.5-325 MG PO TABS: ORAL_TABLET | ORAL | Status: DC

## 2015-07-13 NOTE — Progress Notes (Signed)
Subjective:    Patient ID: Autumn Davis, female    DOB: Oct 31, 1986, 29 y.o.   MRN: [id number]  HPI: Autumn Davis is a 29 year old female who returns for follow up for chronic pain and medication refill. She says her pain is in her rib cage on the right side and mid-back. She rates her pain 7. Her current exercise regime is performing stretching exercises and walking. Also states she has occasional rectal bleeding last episode 07/12/2015 she will follow up with her Gastroenterologist Dr. Mathis Dad.  Pain Inventory Average Pain 9 Pain Right Now 7 My pain is constant, sharp, burning, dull, stabbing, tingling and aching  In the last 24 hours, has pain interfered with the following? General activity 7 Relation with others 7 Enjoyment of life 7 What TIME of day is your pain at its worst? evening, night Sleep (in general) Poor  Pain is worse with: walking, bending, sitting, standing and some activites Pain improves with: rest, heat/ice, therapy/exercise, medication and TENS Relief from Meds: 5  Mobility walk without assistance walk with assistance use a cane use a walker do you drive?  no  Function I need assistance with the following:  dressing, meal prep, household duties and shopping Do you have any goals in this area?  no  Neuro/Psych bladder control problems weakness numbness tremor tingling trouble walking spasms dizziness  Prior Studies Any changes since last visit?  no  Physicians involved in your care Any changes since last visit?  no   Family History  Problem Relation Age of Onset  . Thyroid disease Mother   . Cancer Maternal Grandmother     lung  . Cancer Paternal Grandmother     colon/pancreatiec/lymphoma   Social History   Social History  . Marital Status: Married    Spouse Name: N/A  . Number of Children: N/A  . Years of Education: N/A   Occupational History  . disabled    Social History Main Topics  . Smoking status: Never Smoker    . Smokeless tobacco: Never Used  . Alcohol Use: No  . Drug Use: No  . Sexual Activity: Not Asked   Other Topics Concern  . None   Social History Narrative   Past Surgical History  Procedure Laterality Date  . Knee surgery  2002/2003    bil  . Rectal surgery  correction of prolapse    2010   Past Medical History  Diagnosis Date  . GERD (gastroesophageal reflux disease)   . Hyperlipidemia   . Seizures   . Thyroid disease   . Bronchitis, chronic/intermittent 01/22/2012  . Depression 01/22/2012  . IBS (irritable bowel syndrome) 01/22/2012  . Neuromuscular disorder   . SOB (shortness of breath)    BP 137/94 mmHg  Pulse 99  SpO2 97%  Opioid Risk Score:   Fall Risk Score:  `1  Depression screen PHQ 2/9  Depression screen [facility] 2/9 06/12/2015 03/29/2015 02/16/2015 03/13/2014  Decreased Interest 1 1 1 1   Down, Depressed, Hopeless 0 0 0 1  PHQ - 2 Score 1 1 1 2   Altered sleeping - - 3 0  Tired, decreased energy - - 1 0  Change in appetite - - 1 0  Feeling bad or failure about yourself  - - 0 0  Trouble concentrating - - 1 0  Moving slowly or fidgety/restless - - 0 0  Suicidal thoughts - - 0 0  PHQ-9 Score - - 7 2  Review of Systems  All other systems reviewed and are negative.      Objective:   Physical Exam  Constitutional: She is oriented to person, place, and time. She appears well-developed and well-nourished.  HENT:  Head: Normocephalic and atraumatic.  Neck: Normal range of motion. Neck supple.  Cardiovascular: Normal rate and regular rhythm.   Pulmonary/Chest: Effort normal and breath sounds normal.  Musculoskeletal:  Normal Muscle Bulk and Muscle Testing Reveals: Upper Extremities: Full ROM and Muscle Strength 5/5 Upper Extremity Extension Produces Spasms Thoracic Paraspinal Tenderness: T-7- T-8 ( 5th - 8th rib) Lower Extremities: Full ROM and Muscle Strength 5/5 Arises from chair with ease Narrow Based Gait  Neurological: She is alert and oriented to  person, place, and time.  Skin: Skin is warm and dry.  Psychiatric: She has a normal mood and affect.  Nursing note and vitals reviewed.         Assessment & Plan:  1. Chronic seizure disorder: No seizure's. Continue Gabapentin. Neurology Following  2. Chronic muscle spasms, weakness with associated pain disorder: Continue Baclofen and MOBIC. Continue with Exercise regime. 3. Chronic dysphagia: GI Following  4. Anxiety with depression : Stable. Continue Elavil  5. Fibromyalgia/ Chronic Pain: Continue with exercise and heat Therapy.  Refilled: oxyCODONE 7.5/325mg  one tablet every 6 hours as needed #120.  6. Memory Disturbance: F/U with Neurology 7. Peripheral Neuropathy: Continue Gabapentin  30 minutes of face to face patient care time was spent during this visit. All questions were encouraged and answered

## 2015-07-17 ENCOUNTER — Other Ambulatory Visit: Payer: Self-pay | Admitting: Nurse Practitioner

## 2015-07-19 ENCOUNTER — Telehealth: Payer: Self-pay | Admitting: Nurse Practitioner

## 2015-07-19 ENCOUNTER — Encounter: Payer: Self-pay | Admitting: Internal Medicine

## 2015-07-19 DIAGNOSIS — K625 Hemorrhage of anus and rectum: Secondary | ICD-10-CM

## 2015-07-19 NOTE — Telephone Encounter (Signed)
Needs to see GI- ask patient who she has seen  In past and we will do referral

## 2015-07-19 NOTE — Telephone Encounter (Signed)
Referral made to Dr. Leone Payor at [facility]- That is who I see.

## 2015-08-13 ENCOUNTER — Encounter: Payer: BLUE CROSS/BLUE SHIELD | Attending: Physical Medicine & Rehabilitation | Admitting: Registered Nurse

## 2015-08-13 ENCOUNTER — Encounter: Payer: Self-pay | Admitting: Registered Nurse

## 2015-08-13 VITALS — BP 134/70 | HR 88

## 2015-08-13 DIAGNOSIS — Z79899 Other long term (current) drug therapy: Secondary | ICD-10-CM

## 2015-08-13 DIAGNOSIS — M545 Low back pain: Secondary | ICD-10-CM | POA: Insufficient documentation

## 2015-08-13 DIAGNOSIS — Z5181 Encounter for therapeutic drug level monitoring: Secondary | ICD-10-CM

## 2015-08-13 DIAGNOSIS — M797 Fibromyalgia: Secondary | ICD-10-CM

## 2015-08-13 DIAGNOSIS — M6249 Contracture of muscle, multiple sites: Secondary | ICD-10-CM | POA: Insufficient documentation

## 2015-08-13 DIAGNOSIS — D8989 Other specified disorders involving the immune mechanism, not elsewhere classified: Secondary | ICD-10-CM

## 2015-08-13 DIAGNOSIS — M62838 Other muscle spasm: Secondary | ICD-10-CM

## 2015-08-13 DIAGNOSIS — R569 Unspecified convulsions: Secondary | ICD-10-CM | POA: Diagnosis not present

## 2015-08-13 DIAGNOSIS — M329 Systemic lupus erythematosus, unspecified: Secondary | ICD-10-CM | POA: Diagnosis not present

## 2015-08-13 DIAGNOSIS — L930 Discoid lupus erythematosus: Secondary | ICD-10-CM

## 2015-08-13 DIAGNOSIS — G894 Chronic pain syndrome: Secondary | ICD-10-CM

## 2015-08-13 DIAGNOSIS — R0781 Pleurodynia: Secondary | ICD-10-CM

## 2015-08-13 MED ORDER — OXYCODONE-ACETAMINOPHEN 7.5-325 MG PO TABS: ORAL_TABLET | ORAL | Status: DC

## 2015-08-13 NOTE — Progress Notes (Signed)
Subjective:    Patient ID: Autumn Davis, female    DOB: 08/13/1986, 29 y.o.   MRN: [id number]  HPI: [name] is a 29 year old female who returns for follow up for chronic pain and medication refill. She says her pain is in her rib cage on the right side and mid-back. Also states she's having increase frequency of muscle spasms.She rates her pain 6. Her current exercise regime is performing stretching exercises and walking. She has an appointment with her Gastroenterologist for follow up on her rectal bleeding. She's still in the process of obtaining Rheumatologist PCP working on this. Her PCP doing a full work up regarding Lupus she states.  Pain Inventory Average Pain 9 Pain Right Now 6 My pain is constant, sharp, burning, stabbing, tingling and aching  In the last 24 hours, has pain interfered with the following? General activity 7 Relation with others 7 Enjoyment of life 7 What TIME of day is your pain at its worst? evening, night Sleep (in general) n/a  Pain is worse with: walking, bending, sitting, standing and some activites Pain improves with: rest, heat/ice, therapy/exercise, medication and TENS Relief from Meds: 4  Mobility walk without assistance walk with assistance use a cane use a walker do you drive?  no use a wheelchair transfers alone  Function I need assistance with the following:  dressing, meal prep, household duties and shopping  Neuro/Psych bladder control problems weakness numbness tremor tingling trouble walking spasms dizziness  Prior Studies Any changes since last visit?  no  Physicians involved in your care Any changes since last visit?  no   Family History  Problem Relation Age of Onset  . Thyroid disease Mother   . Cancer Maternal Grandmother     lung  . Cancer Paternal Grandmother     colon/pancreatiec/lymphoma   Social History   Social History  . Marital Status: Married    Spouse Name: N/A  . Number of  Children: N/A  . Years of Education: N/A   Occupational History  . disabled    Social History Main Topics  . Smoking status: Never Smoker   . Smokeless tobacco: Never Used  . Alcohol Use: No  . Drug Use: No  . Sexual Activity: Not Asked   Other Topics Concern  . None   Social History Narrative   Past Surgical History  Procedure Laterality Date  . Knee surgery  2002/2003    bil  . Rectal surgery  correction of prolapse    2010   Past Medical History  Diagnosis Date  . GERD (gastroesophageal reflux disease)   . Hyperlipidemia   . Seizures   . Thyroid disease   . Bronchitis, chronic/intermittent 01/22/2012  . Depression 01/22/2012  . IBS (irritable bowel syndrome) 01/22/2012  . Neuromuscular disorder   . SOB (shortness of breath)    BP 134/70 mmHg  Pulse 88  SpO2 96%  Opioid Risk Score:   Fall Risk Score:  `1  Depression screen PHQ 2/9  Depression screen [facility] 2/9 06/12/2015 03/29/2015 02/16/2015 03/13/2014  Decreased Interest 1 1 1 1   Down, Depressed, Hopeless 0 0 0 1  PHQ - 2 Score 1 1 1 2   Altered sleeping - - 3 0  Tired, decreased energy - - 1 0  Change in appetite - - 1 0  Feeling bad or failure about yourself  - - 0 0  Trouble concentrating - - 1 0  Moving slowly or fidgety/restless - -  0 0  Suicidal thoughts - - 0 0  PHQ-9 Score - - 7 2     Review of Systems  Genitourinary: Positive for dysuria.  Musculoskeletal: Positive for gait problem.  Neurological: Positive for dizziness, tremors, weakness and numbness.       Spasms tingling       Objective:   Physical Exam  Constitutional: She is oriented to person, place, and time. She appears well-developed and well-nourished.  HENT:  Head: Normocephalic and atraumatic.  Neck: Normal range of motion. Neck supple.  Cardiovascular: Normal rate and regular rhythm.   Pulmonary/Chest: Effort normal and breath sounds normal.  Musculoskeletal:  Normal Muscle Bulk and Muscle Testing Reveals: Upper Extremities:  Full ROM and Muscle Strength 5/5 Spasticity noted with extension Thoracic Paraspinal Tenderness: T-11- T-12 Lower Extremities: Full ROM and Muscle Strength 5/5 Arises from chair slowly using straight cane for support Narrow Based Gait  Neurological: She is oriented to person, place, and time.  Skin: Skin is warm and dry.  Psychiatric: She has a normal mood and affect.  Nursing note and vitals reviewed.         Assessment & Plan:  1. Chronic seizure disorder: No seizure's. Continue Gabapentin. Neurology Following  2. Chronic muscle spasms, weakness with associated pain disorder: Continue Baclofen and MOBIC. Continue with Exercise regime. 3. Chronic dysphagia: GI Following  4. Anxiety with depression : Stable. Continue Elavil  5. Fibromyalgia/ Chronic Pain: Continue with exercise and heat Therapy.  Refilled: oxyCODONE 7.5/325mg  one tablet every 6 hours as needed #120.  6. Memory Disturbance: F/U with Neurology 7. Peripheral Neuropathy: Continue Gabapentin  30 minutes of face to face patient care time was spent during this visit. All questions were encouraged and answered

## 2015-08-14 ENCOUNTER — Other Ambulatory Visit: Payer: Self-pay | Admitting: Registered Nurse

## 2015-08-14 DIAGNOSIS — M545 Low back pain: Secondary | ICD-10-CM | POA: Diagnosis not present

## 2015-08-14 DIAGNOSIS — M797 Fibromyalgia: Secondary | ICD-10-CM | POA: Diagnosis not present

## 2015-08-14 DIAGNOSIS — R0781 Pleurodynia: Secondary | ICD-10-CM | POA: Diagnosis not present

## 2015-08-14 DIAGNOSIS — M329 Systemic lupus erythematosus, unspecified: Secondary | ICD-10-CM | POA: Diagnosis not present

## 2015-08-14 DIAGNOSIS — G894 Chronic pain syndrome: Secondary | ICD-10-CM | POA: Diagnosis not present

## 2015-08-14 DIAGNOSIS — M6249 Contracture of muscle, multiple sites: Secondary | ICD-10-CM | POA: Diagnosis not present

## 2015-08-14 DIAGNOSIS — Z5181 Encounter for therapeutic drug level monitoring: Secondary | ICD-10-CM | POA: Diagnosis not present

## 2015-08-14 DIAGNOSIS — R569 Unspecified convulsions: Secondary | ICD-10-CM | POA: Diagnosis not present

## 2015-08-14 DIAGNOSIS — Z79899 Other long term (current) drug therapy: Secondary | ICD-10-CM | POA: Diagnosis not present

## 2015-08-14 DIAGNOSIS — L930 Discoid lupus erythematosus: Secondary | ICD-10-CM | POA: Diagnosis not present

## 2015-08-15 LAB — PMP ALCOHOL METABOLITE (ETG): Ethyl Glucuronide (EtG): NEGATIVE ng/mL

## 2015-08-16 ENCOUNTER — Other Ambulatory Visit: Payer: Self-pay | Admitting: Nurse Practitioner

## 2015-08-19 LAB — OPIATES/OPIOIDS (LC/MS-MS)
Codeine Urine: NEGATIVE ng/mL (ref <50)
HYDROMORPHONE: NEGATIVE ng/mL (ref <50)
Hydrocodone: NEGATIVE ng/mL (ref <50)
Morphine Urine: NEGATIVE ng/mL (ref <50)
Norhydrocodone, Ur: NEGATIVE ng/mL (ref <50)
Noroxycodone, Ur: 4546 ng/mL (ref <50)
OXYCODONE, UR: 2215 ng/mL (ref <50)
OXYMORPHONE, URINE: 1451 ng/mL (ref <50)

## 2015-08-19 LAB — OXYCODONE, URINE (LC/MS-MS)
NOROXYCODONE, UR: 4546 ng/mL (ref <50)
OXYCODONE, UR: 2215 ng/mL (ref <50)
OXYMORPHONE, URINE: 1451 ng/mL (ref <50)

## 2015-08-21 LAB — PRESCRIPTION MONITORING PROFILE (SOLSTAS)
Amphetamine/Meth: NEGATIVE ng/mL
BARBITURATE SCREEN, URINE: NEGATIVE ng/mL
BUPRENORPHINE, URINE: NEGATIVE ng/mL
Benzodiazepine Screen, Urine: NEGATIVE ng/mL
CANNABINOID SCRN UR: NEGATIVE ng/mL
CARISOPRODOL, URINE: NEGATIVE ng/mL
COCAINE METABOLITES: NEGATIVE ng/mL
CREATININE, URINE: 116.89 mg/dL (ref >20.0)
ECSTASY: NEGATIVE ng/mL
FENTANYL URINE: NEGATIVE ng/mL
Meperidine, Ur: NEGATIVE ng/mL
Methadone Screen, Urine: NEGATIVE ng/mL
Nitrites, Initial: NEGATIVE ug/mL
PH URINE, INITIAL: 5.4 pH (ref 4.5–8.9)
PROPOXYPHENE: NEGATIVE ng/mL
Tapentadol, urine: NEGATIVE ng/mL
Tramadol Scrn, Ur: NEGATIVE ng/mL
ZOLPIDEM, URINE: NEGATIVE ng/mL

## 2015-08-27 ENCOUNTER — Other Ambulatory Visit: Payer: Self-pay | Admitting: Registered Nurse

## 2015-08-27 NOTE — Telephone Encounter (Signed)
Prednisone reordered

## 2015-08-29 NOTE — Progress Notes (Signed)
Urine drug screen for this encounter is consistent for prescribed medication 

## 2015-08-31 ENCOUNTER — Other Ambulatory Visit: Payer: Self-pay | Admitting: *Deleted

## 2015-08-31 MED ORDER — GABAPENTIN 600 MG PO TABS: 600.0000 mg | ORAL_TABLET | Freq: Four times a day (QID) | ORAL | Status: DC

## 2015-09-10 ENCOUNTER — Encounter: Payer: BLUE CROSS/BLUE SHIELD | Admitting: Registered Nurse

## 2015-09-11 ENCOUNTER — Encounter: Payer: BLUE CROSS/BLUE SHIELD | Attending: Physical Medicine & Rehabilitation | Admitting: Registered Nurse

## 2015-09-11 ENCOUNTER — Encounter: Payer: Self-pay | Admitting: Registered Nurse

## 2015-09-11 VITALS — BP 135/89 | HR 95 | Resp 14

## 2015-09-11 DIAGNOSIS — R569 Unspecified convulsions: Secondary | ICD-10-CM | POA: Diagnosis not present

## 2015-09-11 DIAGNOSIS — M329 Systemic lupus erythematosus, unspecified: Secondary | ICD-10-CM | POA: Insufficient documentation

## 2015-09-11 DIAGNOSIS — G894 Chronic pain syndrome: Secondary | ICD-10-CM

## 2015-09-11 DIAGNOSIS — M6249 Contracture of muscle, multiple sites: Secondary | ICD-10-CM

## 2015-09-11 DIAGNOSIS — Z79899 Other long term (current) drug therapy: Secondary | ICD-10-CM

## 2015-09-11 DIAGNOSIS — D8989 Other specified disorders involving the immune mechanism, not elsewhere classified: Secondary | ICD-10-CM

## 2015-09-11 DIAGNOSIS — Z5181 Encounter for therapeutic drug level monitoring: Secondary | ICD-10-CM

## 2015-09-11 DIAGNOSIS — L930 Discoid lupus erythematosus: Secondary | ICD-10-CM | POA: Diagnosis not present

## 2015-09-11 DIAGNOSIS — M797 Fibromyalgia: Secondary | ICD-10-CM | POA: Diagnosis not present

## 2015-09-11 DIAGNOSIS — M545 Low back pain: Secondary | ICD-10-CM

## 2015-09-11 DIAGNOSIS — M62838 Other muscle spasm: Secondary | ICD-10-CM

## 2015-09-11 DIAGNOSIS — R0781 Pleurodynia: Secondary | ICD-10-CM | POA: Diagnosis not present

## 2015-09-11 MED ORDER — OXYCODONE-ACETAMINOPHEN 7.5-325 MG PO TABS: ORAL_TABLET | ORAL | Status: DC

## 2015-09-11 NOTE — Progress Notes (Signed)
Subjective:    Patient ID: Autumn Davis, female    DOB: Jan 15, 1986, 29 y.o.   MRN: [id number]  HPI: [name] is a 29 year old female who returns for follow up for chronic pain and medication refill. She says her pain is in her rib cage on the right side and mid-back. Also complaining of lower extremities tingling.She rates her pain 8. Her current exercise regime is performing stretching exercises and walking.  Pain Inventory Average Pain 9 Pain Right Now 8 My pain is sharp, burning, dull, stabbing and tingling  In the last 24 hours, has pain interfered with the following? General activity 7 Relation with others 7 Enjoyment of life 7 What TIME of day is your pain at its worst? Evening and Night Sleep (in general) Poor  Pain is worse with: walking, bending, sitting, standing and some activites Pain improves with: rest, heat/ice, therapy/exercise, medication and TENS Relief from Meds: 2  Mobility walk without assistance walk with assistance use a cane use a walker do you drive?  no use a wheelchair transfers alone  Function I need assistance with the following:  dressing, meal prep, household duties and shopping  Neuro/Psych bladder control problems weakness numbness tremor tingling trouble walking spasms dizziness  Prior Studies Any changes since last visit?  no  Physicians involved in your care Any changes since last visit?  no   Family History  Problem Relation Age of Onset  . Thyroid disease Mother   . Cancer Maternal Grandmother     lung  . Cancer Paternal Grandmother     colon/pancreatiec/lymphoma   Social History   Social History  . Marital Status: Married    Spouse Name: N/A  . Number of Children: N/A  . Years of Education: N/A   Occupational History  . disabled    Social History Main Topics  . Smoking status: Never Smoker   . Smokeless tobacco: Never Used  . Alcohol Use: No  . Drug Use: No  . Sexual Activity: Not  Asked   Other Topics Concern  . None   Social History Narrative   Past Surgical History  Procedure Laterality Date  . Knee surgery  2002/2003    bil  . Rectal surgery  correction of prolapse    2010   Past Medical History  Diagnosis Date  . GERD (gastroesophageal reflux disease)   . Hyperlipidemia   . Seizures (HCC)   . Thyroid disease   . Bronchitis, chronic/intermittent 01/22/2012  . Depression 01/22/2012  . IBS (irritable bowel syndrome) 01/22/2012  . Neuromuscular disorder (HCC)   . SOB (shortness of breath)    BP 135/89 mmHg  Pulse 95  Resp 14  SpO2 98%  Opioid Risk Score:   Fall Risk Score:  `1  Depression screen PHQ 2/9  Depression screen [facility] 2/9 06/12/2015 03/29/2015 02/16/2015 03/13/2014  Decreased Interest 1 1 1 1   Down, Depressed, Hopeless 0 0 0 1  PHQ - 2 Score 1 1 1 2   Altered sleeping - - 3 0  Tired, decreased energy - - 1 0  Change in appetite - - 1 0  Feeling bad or failure about yourself  - - 0 0  Trouble concentrating - - 1 0  Moving slowly or fidgety/restless - - 0 0  Suicidal thoughts - - 0 0  PHQ-9 Score - - 7 2     Review of Systems  Genitourinary:       Bladder Control Problems  Musculoskeletal:       Spasms  Neurological: Positive for dizziness, tremors, weakness and numbness.       Tingling Gait Instability        Objective:   Physical Exam  Constitutional: She is oriented to person, place, and time. She appears well-developed and well-nourished.  HENT:  Head: Normocephalic and atraumatic.  Neck: Normal range of motion. Neck supple.  Cardiovascular: Normal rate and regular rhythm.   Pulmonary/Chest: Effort normal and breath sounds normal.  Musculoskeletal:  Normal Muscle Bulk and Muscle Testing Reveals: Upper Extremities: Decreased ROM 90 Degrees and Muscle Strength 5/5 Thoracic Paraspinal Tenderness: T-7- T-11 Lower Extremities: Full ROM and Muscle Strength 5/5 Arises from chair slowly using straight cane for support Antalgic  Gait   Neurological: She is alert and oriented to person, place, and time.  Skin: Skin is warm and dry.  Psychiatric: She has a normal mood and affect.  Nursing note and vitals reviewed.         Assessment & Plan:  1. Chronic seizure disorder: No seizure's. Continue Gabapentin. Neurology Following  2. Chronic muscle spasms, weakness with associated pain disorder: Continue Baclofen and MOBIC. Continue with Exercise regime. 3. Chronic dysphagia: GI Following  4. Anxiety with depression : Stable. Continue Elavil  5. Fibromyalgia/ Chronic Pain: Continue with exercise and heat Therapy.  Refilled: oxyCODONE 7.5/325mg  one tablet every 6 hours as needed #120.  6. Peripheral Neuropathy: Continue Gabapentin  30 minutes of face to face patient care time was spent during this visit. All questions were encouraged and answered

## 2015-09-15 ENCOUNTER — Other Ambulatory Visit: Payer: Self-pay | Admitting: Nurse Practitioner

## 2015-09-19 ENCOUNTER — Other Ambulatory Visit: Payer: Self-pay

## 2015-09-19 ENCOUNTER — Telehealth: Payer: Self-pay | Admitting: Nurse Practitioner

## 2015-09-19 DIAGNOSIS — F411 Generalized anxiety disorder: Secondary | ICD-10-CM | POA: Diagnosis not present

## 2015-09-19 MED ORDER — AMITRIPTYLINE HCL 75 MG PO TABS: 75.0000 mg | ORAL_TABLET | Freq: Every day | ORAL | Status: DC

## 2015-09-20 NOTE — Telephone Encounter (Signed)
Labs faxed

## 2015-09-24 ENCOUNTER — Encounter: Payer: Self-pay | Admitting: Internal Medicine

## 2015-09-24 ENCOUNTER — Ambulatory Visit (INDEPENDENT_AMBULATORY_CARE_PROVIDER_SITE_OTHER): Payer: BLUE CROSS/BLUE SHIELD | Admitting: Internal Medicine

## 2015-09-24 VITALS — BP 126/84 | HR 84 | Ht 68.0 in | Wt 220.8 lb

## 2015-09-24 DIAGNOSIS — R1310 Dysphagia, unspecified: Secondary | ICD-10-CM | POA: Diagnosis not present

## 2015-09-24 DIAGNOSIS — K648 Other hemorrhoids: Secondary | ICD-10-CM

## 2015-09-24 NOTE — Patient Instructions (Signed)
  You have been scheduled for an endoscopy. Please follow written instructions given to you at your visit today. If you use inhalers (even only as needed), please bring them with you on the day of your procedure. Your physician has requested that you go to www.startemmi.com and enter the access code given to you at your visit today. This web site gives a general overview about your procedure. However, you should still follow specific instructions given to you by our office regarding your preparation for the procedure.  Today you have been given a handout on benefiber to read and follow.   We will obtain your records from Dr Loreta AveMann for Dr Leone PayorGessner to review.    I appreciate the opportunity to care for you. Stan Headarl Gessner, MD, [facility]

## 2015-09-24 NOTE — Progress Notes (Signed)
Referred by [name] NP  Subjective:    Patient ID: Autumn Davis, female    DOB: [birth year], 29 y.o.   MRN: [id number] Cc: rectal bleeding, throat tightening HPINice young woman here with several c/o. She has intermittent throat tightening, swallowing problems with food  x 1-1.5 yrs. Is on Prilosec - incomplete relief of heartburn.  Hx of rectal prolapse and surgical correction. No more prolapse - but has mucoid bloody discharge - intermittent with some anal discomfort.  Takes daily Colace to help with defecation. She also c/o bloating, anorexia and nausea.  Remote colonoscopy - apparently negative. She is on chronic oxycodone.  She is disabled.  Medications, allergies, past medical history, past surgical history, family history and social history are reviewed and updated in the EMR.  Review of Systems Insomnia, pedal edema, muscle cramps, joint pain, fatigue and headaches reported. Otherwise negative or per HPI    Objective:   Physical Exam  @BP  126/84 mmHg  Pulse 84  Ht 5\' 8"  (1.727 m)  Wt 220 lb 12.8 oz (100.154 kg)  BMI 33.58 kg/m2@  General:  Well-developed, well-nourished and in no acute distress Eyes:  anicteric. ENT:   Mouth and posterior pharynx free of lesions.  Neck:   supple w/o thyromegaly or mass.  Lungs: Clear to auscultation bilaterally. Heart:  S1S2, no rubs, murmurs, gallops. Abdomen:  soft, non-tender, no hepatosplenomegaly, hernia, or mass and BS+.  Rectal:  Patti [place], CMA present.  Anoderm inspection revealed small RA tag Anal wink was present Digital exam revealed normal resting tone but decreased  voluntary squeeze. No mass present - suspect rectocele vs capacious vault Simulated defecation with valsalva revealed appropriate abdominal contraction and descent.   Anoscopy was performed with the patient in the left lateral decubitus position while a chaperone was present and revealed Gr 1 inflamed internal hemorrhoids all  positions   Lymph:  no cervical or supraclavicular adenopathy. Extremities:   no edema, cyanosis or clubbing Skin   erythema ab igne changes from heating pad low back Neuro:  A&O x 3.  Psych:  appropriate mood and  Affect.   Data Reviewed: Labs in EMR, PCP notes      Assessment & Plan:  Dysphagia  Hemorrhoids, internal, with bleeding   1. EGD, possible dilation to evaluate and treat dysphagia The risks and benefits as well as alternatives of endoscopic procedure(s) have been discussed and reviewed. All questions answered. The patient agrees to proceed. benefiber to treat what I think are symptomatic internal hemorrhoids Review old GI records, colonoscopy - Dr. Loreta Ave  I appreciate the opportunity to care for this patient.  GM:WNUUVO,ZDGU MARGARET, FNP

## 2015-09-26 ENCOUNTER — Encounter: Payer: Self-pay | Admitting: Internal Medicine

## 2015-09-26 ENCOUNTER — Other Ambulatory Visit: Payer: Self-pay | Admitting: Nurse Practitioner

## 2015-09-27 ENCOUNTER — Encounter: Payer: Self-pay | Admitting: Nurse Practitioner

## 2015-09-27 ENCOUNTER — Ambulatory Visit (INDEPENDENT_AMBULATORY_CARE_PROVIDER_SITE_OTHER): Payer: BLUE CROSS/BLUE SHIELD | Admitting: Nurse Practitioner

## 2015-09-27 VITALS — BP 134/89 | HR 95 | Temp 97.2°F | Resp 18 | Wt 221.8 lb

## 2015-09-27 DIAGNOSIS — F329 Major depressive disorder, single episode, unspecified: Secondary | ICD-10-CM

## 2015-09-27 DIAGNOSIS — K589 Irritable bowel syndrome without diarrhea: Secondary | ICD-10-CM

## 2015-09-27 DIAGNOSIS — E079 Disorder of thyroid, unspecified: Secondary | ICD-10-CM | POA: Diagnosis not present

## 2015-09-27 DIAGNOSIS — Z309 Encounter for contraceptive management, unspecified: Secondary | ICD-10-CM

## 2015-09-27 DIAGNOSIS — F32A Depression, unspecified: Secondary | ICD-10-CM

## 2015-09-27 DIAGNOSIS — M329 Systemic lupus erythematosus, unspecified: Secondary | ICD-10-CM

## 2015-09-27 DIAGNOSIS — K219 Gastro-esophageal reflux disease without esophagitis: Secondary | ICD-10-CM

## 2015-09-27 DIAGNOSIS — Z23 Encounter for immunization: Secondary | ICD-10-CM | POA: Diagnosis not present

## 2015-09-27 DIAGNOSIS — M797 Fibromyalgia: Secondary | ICD-10-CM | POA: Diagnosis not present

## 2015-09-27 DIAGNOSIS — Z3042 Encounter for surveillance of injectable contraceptive: Secondary | ICD-10-CM | POA: Diagnosis not present

## 2015-09-27 DIAGNOSIS — E785 Hyperlipidemia, unspecified: Secondary | ICD-10-CM

## 2015-09-27 NOTE — Progress Notes (Signed)
Subjective:    Patient ID: Autumn Davis, female    DOB: 1986/04/10, 29 y.o.   MRN: [id number]  HPI: Pt here today for management of chronic illness, no complaints today. Is going tomorrow to GI MD for an upper endoscopy  Hyperlipidemia This is a chronic problem. The current episode started more than 1 year ago. The problem is controlled. Recent lipid tests were reviewed and are normal. Factors aggravating her hyperlipidemia include fatty foods. Pertinent negatives include no chest pain. Current antihyperlipidemic treatment includes statins. The current treatment provides no improvement of lipids. There are no compliance problems.  Risk factors for coronary artery disease include dyslipidemia and obesity.  Thyroid Problem Presents for follow-up visit. Patient reports no fatigue or palpitations. The symptoms have been stable. The treatment provided significant relief. Her past medical history is significant for hyperlipidemia.   GERD Currently managed with prilosec at this time, no side effects IBS Currently managed well, denies any complications at this visit Fibromyalgia States she is doing well, no recent flares of pain Depression Managing pretty well at this time, remains on Elavil with no side effects Lupus  States she has good days and bad days, but managing ok at this time Review of Systems  Constitutional: Negative for activity change, appetite change and fatigue.  HENT: Negative.   Respiratory: Negative.   Cardiovascular: Negative for chest pain and palpitations.  Gastrointestinal: Negative.   Endocrine: Negative.   Genitourinary: Negative.   Skin: Negative.   Neurological: Negative for dizziness and light-headedness.  Psychiatric/Behavioral: Negative.   All other systems reviewed and are negative.      Objective:   Physical Exam  Constitutional: She is oriented to person, place, and time. She appears well-developed and well-nourished.  HENT:  Right Ear: External ear  normal.  Left Ear: External ear normal.  Nose: Nose normal.  Mouth/Throat: Oropharynx is clear and moist.  Eyes: Pupils are equal, round, and reactive to light.  Neck: Normal range of motion. Neck supple.  Cardiovascular: Normal rate, regular rhythm and normal heart sounds.   Pulmonary/Chest: Effort normal and breath sounds normal.  Abdominal: Soft. Bowel sounds are normal.  Neurological: She is alert and oriented to person, place, and time. She has normal reflexes. No cranial nerve deficit.  Skin: Skin is warm and dry.  Psychiatric: She has a normal mood and affect. Her behavior is normal. Judgment and thought content normal.    BP 134/89 mmHg  Pulse 95  Temp(Src) 97.2 F (36.2 C)  Resp 18  Wt 221 lb 12.8 oz (100.608 kg)      Assessment & Plan:  1. Gastroesophageal reflux disease without esophagitis Avoid spicy foods, and eating 2 hours prior to bedtime Continue with prilosec  2. IBS (irritable bowel syndrome) No changes to current medications  3. Thyroid disease No medications at this time TSH checked earlier in the year  4. Fibromyalgia No changes with medications  5. Depression Stress managment  6. Hyperlipidemia Low fat diet and exercise -Lipid Panel -CMP 7. Lupus (systemic lupus erythematosus) (HCC)  Counseling done on all immunizations given today Discussed and Reviewed flu shot at this visit Continue all meds Labs pending Health Maintenance reviewed Diet and exercise encouraged RTO 3 months  Mary-Margaret Daphine Deutscher, FNP

## 2015-09-27 NOTE — Patient Instructions (Signed)
Health Maintenance, Female Adopting a healthy lifestyle and getting preventive care can go a long way to promote health and wellness. Talk with your health care provider about what schedule of regular examinations is right for you. This is a good chance for you to check in with your provider about disease prevention and staying healthy. In between checkups, there are plenty of things you can do on your own. Experts have done a lot of research about which lifestyle changes and preventive measures are most likely to keep you healthy. Ask your health care provider for more information. WEIGHT AND DIET  Eat a healthy diet  Be sure to include plenty of vegetables, fruits, low-fat dairy products, and lean protein.  Do not eat a lot of foods high in solid fats, added sugars, or salt.  Get regular exercise. This is one of the most important things you can do for your health.  Most adults should exercise for at least 150 minutes each week. The exercise should increase your heart rate and make you sweat (moderate-intensity exercise).  Most adults should also do strengthening exercises at least twice a week. This is in addition to the moderate-intensity exercise.  Maintain a healthy weight  Body mass index (BMI) is a measurement that can be used to identify possible weight problems. It estimates body fat based on height and weight. Your health care provider can help determine your BMI and help you achieve or maintain a healthy weight.  For females 20 years of age and older:   A BMI below 18.5 is considered underweight.  A BMI of 18.5 to 24.9 is normal.  A BMI of 25 to 29.9 is considered overweight.  A BMI of 30 and above is considered obese.  Watch levels of cholesterol and blood lipids  You should start having your blood tested for lipids and cholesterol at 31 years of age, then have this test every 5 years.  You may need to have your cholesterol levels checked more often if:  Your lipid  or cholesterol levels are high.  You are older than 31 years of age.  You are at high risk for heart disease.  CANCER SCREENING   Lung Cancer  Lung cancer screening is recommended for adults 55-80 years old who are at high risk for lung cancer because of a history of smoking.  A yearly low-dose CT scan of the lungs is recommended for people who:  Currently smoke.  Have quit within the past 15 years.  Have at least a 30-pack-year history of smoking. A pack year is smoking an average of one pack of cigarettes a day for 1 year.  Yearly screening should continue until it has been 15 years since you quit.  Yearly screening should stop if you develop a health problem that would prevent you from having lung cancer treatment.  Breast Cancer  Practice breast self-awareness. This means understanding how your breasts normally appear and feel.  It also means doing regular breast self-exams. Let your health care provider know about any changes, no matter how small.  If you are in your 20s or 30s, you should have a clinical breast exam (CBE) by a health care provider every 1-3 years as part of a regular health exam.  If you are 40 or older, have a CBE every year. Also consider having a breast X-ray (mammogram) every year.  If you have a family history of breast cancer, talk to your health care provider about genetic screening.  If you   are at high risk for breast cancer, talk to your health care provider about having an MRI and a mammogram every year.  Breast cancer gene (BRCA) assessment is recommended for women who have family members with BRCA-related cancers. BRCA-related cancers include:  Breast.  Ovarian.  Tubal.  Peritoneal cancers.  Results of the assessment will determine the need for genetic counseling and BRCA1 and BRCA2 testing. Cervical Cancer Your health care provider may recommend that you be screened regularly for cancer of the pelvic organs (ovaries, uterus, and  vagina). This screening involves a pelvic examination, including checking for microscopic changes to the surface of your cervix (Pap test). You may be encouraged to have this screening done every 3 years, beginning at age 21.  For women ages 30-65, health care providers may recommend pelvic exams and Pap testing every 3 years, or they may recommend the Pap and pelvic exam, combined with testing for human papilloma virus (HPV), every 5 years. Some types of HPV increase your risk of cervical cancer. Testing for HPV may also be done on women of any age with unclear Pap test results.  Other health care providers may not recommend any screening for nonpregnant women who are considered low risk for pelvic cancer and who do not have symptoms. Ask your health care provider if a screening pelvic exam is right for you.  If you have had past treatment for cervical cancer or a condition that could lead to cancer, you need Pap tests and screening for cancer for at least 20 years after your treatment. If Pap tests have been discontinued, your risk factors (such as having a new sexual partner) need to be reassessed to determine if screening should resume. Some women have medical problems that increase the chance of getting cervical cancer. In these cases, your health care provider may recommend more frequent screening and Pap tests. Colorectal Cancer  This type of cancer can be detected and often prevented.  Routine colorectal cancer screening usually begins at 31 years of age and continues through 31 years of age.  Your health care provider may recommend screening at an earlier age if you have risk factors for colon cancer.  Your health care provider may also recommend using home test kits to check for hidden blood in the stool.  A small camera at the end of a tube can be used to examine your colon directly (sigmoidoscopy or colonoscopy). This is done to check for the earliest forms of colorectal  cancer.  Routine screening usually begins at age 50.  Direct examination of the colon should be repeated every 5-10 years through 31 years of age. However, you may need to be screened more often if early forms of precancerous polyps or small growths are found. Skin Cancer  Check your skin from head to toe regularly.  Tell your health care provider about any new moles or changes in moles, especially if there is a change in a mole's shape or color.  Also tell your health care provider if you have a mole that is larger than the size of a pencil eraser.  Always use sunscreen. Apply sunscreen liberally and repeatedly throughout the day.  Protect yourself by wearing long sleeves, pants, a wide-brimmed hat, and sunglasses whenever you are outside. HEART DISEASE, DIABETES, AND HIGH BLOOD PRESSURE   High blood pressure causes heart disease and increases the risk of stroke. High blood pressure is more likely to develop in:  People who have blood pressure in the high end   of the normal range (130-139/85-89 mm Hg).  People who are overweight or obese.  People who are African American.  If you are 38-23 years of age, have your blood pressure checked every 3-5 years. If you are 61 years of age or older, have your blood pressure checked every year. You should have your blood pressure measured twice--once when you are at a hospital or clinic, and once when you are not at a hospital or clinic. Record the average of the two measurements. To check your blood pressure when you are not at a hospital or clinic, you can use:  An automated blood pressure machine at a pharmacy.  A home blood pressure monitor.  If you are between 45 years and 39 years old, ask your health care provider if you should take aspirin to prevent strokes.  Have regular diabetes screenings. This involves taking a blood sample to check your fasting blood sugar level.  If you are at a normal weight and have a low risk for diabetes,  have this test once every three years after 31 years of age.  If you are overweight and have a high risk for diabetes, consider being tested at a younger age or more often. PREVENTING INFECTION  Hepatitis B  If you have a higher risk for hepatitis B, you should be screened for this virus. You are considered at high risk for hepatitis B if:  You were born in a country where hepatitis B is common. Ask your health care provider which countries are considered high risk.  Your parents were born in a high-risk country, and you have not been immunized against hepatitis B (hepatitis B vaccine).  You have HIV or AIDS.  You use needles to inject street drugs.  You live with someone who has hepatitis B.  You have had sex with someone who has hepatitis B.  You get hemodialysis treatment.  You take certain medicines for conditions, including cancer, organ transplantation, and autoimmune conditions. Hepatitis C  Blood testing is recommended for:  Everyone born from [birth year] through 1967.  Anyone with known risk factors for hepatitis C. Sexually transmitted infections (STIs)  You should be screened for sexually transmitted infections (STIs) including gonorrhea and chlamydia if:  You are sexually active and are younger than 31 years of age.  You are older than 31 years of age and your health care provider tells you that you are at risk for this type of infection.  Your sexual activity has changed since you were last screened and you are at an increased risk for chlamydia or gonorrhea. Ask your health care provider if you are at risk.  If you do not have HIV, but are at risk, it may be recommended that you take a prescription medicine daily to prevent HIV infection. This is called pre-exposure prophylaxis (PrEP). You are considered at risk if:  You are sexually active and do not regularly use condoms or know the HIV status of your partner(s).  You take drugs by injection.  You are sexually  active with a partner who has HIV. Talk with your health care provider about whether you are at high risk of being infected with HIV. If you choose to begin PrEP, you should first be tested for HIV. You should then be tested every 3 months for as long as you are taking PrEP.  PREGNANCY   If you are premenopausal and you may become pregnant, ask your health care provider about preconception counseling.  If you may  become pregnant, take 400 to 800 micrograms (mcg) of folic acid every day.  If you want to prevent pregnancy, talk to your health care provider about birth control (contraception). OSTEOPOROSIS AND MENOPAUSE   Osteoporosis is a disease in which the bones lose minerals and strength with aging. This can result in serious bone fractures. Your risk for osteoporosis can be identified using a bone density scan.  If you are 61 years of age or older, or if you are at risk for osteoporosis and fractures, ask your health care provider if you should be screened.  Ask your health care provider whether you should take a calcium or vitamin D supplement to lower your risk for osteoporosis.  Menopause may have certain physical symptoms and risks.  Hormone replacement therapy may reduce some of these symptoms and risks. Talk to your health care provider about whether hormone replacement therapy is right for you.  HOME CARE INSTRUCTIONS   Schedule regular health, dental, and eye exams.  Stay current with your immunizations.   Do not use any tobacco products including cigarettes, chewing tobacco, or electronic cigarettes.  If you are pregnant, do not drink alcohol.  If you are breastfeeding, limit how much and how often you drink alcohol.  Limit alcohol intake to no more than 1 drink per day for nonpregnant women. One drink equals 12 ounces of beer, 5 ounces of wine, or 1 ounces of hard liquor.  Do not use street drugs.  Do not share needles.  Ask your health care provider for help if  you need support or information about quitting drugs.  Tell your health care provider if you often feel depressed.  Tell your health care provider if you have ever been abused or do not feel safe at home.   This information is not intended to replace advice given to you by your health care provider. Make sure you discuss any questions you have with your health care provider.   Document Released: 05/19/2011 Document Revised: 11/24/2014 Document Reviewed: 10/05/2013 Elsevier Interactive Patient Education [employer].

## 2015-09-28 ENCOUNTER — Ambulatory Visit (AMBULATORY_SURGERY_CENTER): Payer: BLUE CROSS/BLUE SHIELD | Admitting: Internal Medicine

## 2015-09-28 ENCOUNTER — Telehealth: Payer: Self-pay

## 2015-09-28 ENCOUNTER — Encounter: Payer: Self-pay | Admitting: Internal Medicine

## 2015-09-28 VITALS — BP 132/82 | HR 89 | Temp 97.3°F | Resp 18 | Ht 68.0 in | Wt 220.0 lb

## 2015-09-28 DIAGNOSIS — R569 Unspecified convulsions: Secondary | ICD-10-CM | POA: Diagnosis not present

## 2015-09-28 DIAGNOSIS — R1310 Dysphagia, unspecified: Secondary | ICD-10-CM

## 2015-09-28 DIAGNOSIS — G8254 Quadriplegia, C5-C7 incomplete: Secondary | ICD-10-CM | POA: Diagnosis not present

## 2015-09-28 DIAGNOSIS — E039 Hypothyroidism, unspecified: Secondary | ICD-10-CM | POA: Diagnosis not present

## 2015-09-28 LAB — CMP14+EGFR
A/G RATIO: 2 (ref 1.1–2.5)
ALT: 16 IU/L (ref 0–32)
AST: 18 IU/L (ref 0–40)
Albumin: 4.2 g/dL (ref 3.5–5.5)
Alkaline Phosphatase: 106 IU/L (ref 39–117)
BILIRUBIN TOTAL: 0.4 mg/dL (ref 0.0–1.2)
BUN/Creatinine Ratio: 13 (ref 8–20)
BUN: 10 mg/dL (ref 6–20)
CALCIUM: 9.3 mg/dL (ref 8.7–10.2)
CHLORIDE: 103 mmol/L (ref 97–106)
CO2: 23 mmol/L (ref 18–29)
Creatinine, Ser: 0.75 mg/dL (ref 0.57–1.00)
GFR calc Af Amer: 123 mL/min/{1.73_m2} (ref >59)
GFR, EST NON AFRICAN AMERICAN: 107 mL/min/{1.73_m2} (ref >59)
GLOBULIN, TOTAL: 2.1 g/dL (ref 1.5–4.5)
Glucose: 102 mg/dL — ABNORMAL HIGH (ref 65–99)
POTASSIUM: 3.4 mmol/L — AB (ref 3.5–5.2)
SODIUM: 143 mmol/L (ref 136–144)
Total Protein: 6.3 g/dL (ref 6.0–8.5)

## 2015-09-28 LAB — LIPID PANEL
CHOL/HDL RATIO: 4 ratio (ref 0.0–4.4)
Cholesterol, Total: 209 mg/dL — ABNORMAL HIGH (ref 100–199)
HDL: 52 mg/dL (ref >39)
LDL Calculated: 101 mg/dL — ABNORMAL HIGH (ref 0–99)
TRIGLYCERIDES: 279 mg/dL — AB (ref 0–149)
VLDL Cholesterol Cal: 56 mg/dL — ABNORMAL HIGH (ref 5–40)

## 2015-09-28 MED ORDER — SODIUM CHLORIDE 0.9 % IV SOLN: 500.0000 mL | INTRAVENOUS | Status: DC

## 2015-09-28 NOTE — Progress Notes (Signed)
PT. Stated that it has been 6 months or more since she had a seizure and she took her medication this morning.

## 2015-09-28 NOTE — Op Note (Signed)
[facility] [address] [place], [id number]   ENDOSCOPY PROCEDURE REPORT  PATIENT: Autumn CiscoDrake, [name]  MR#: #[id number] BIRTHDATE: 1986/06/15 , 29  yrs. old GENDER: female ENDOSCOPIST: Iva Booparl E Tamirra Sienkiewicz, MD, [facility] PROCEDURE DATE:  09/28/2015 PROCEDURE:  EGD, diagnostic ASA CLASS:     Class III INDICATIONS:  dysphagia. MEDICATIONS: Monitored anesthesia care, Lidocaine 40 mg IV, and Propofol 140 mg IV TOPICAL ANESTHETIC: none  DESCRIPTION OF PROCEDURE: After the risks benefits and alternatives of the procedure were thoroughly explained, informed consent was obtained.  The LB [id number] endoscope was introduced through the mouth and advanced to the second portion of the duodenum , Without limitations.  The instrument was slowly withdrawn as the mucosa was fully examined.      EXAM: The esophagus and gastroesophageal junction were completely normal in appearance.  The stomach was entered and closely examined.The antrum, angularis, and lesser curvature were well visualized, including a retroflexed view of the cardia and fundus. The stomach wall was normally distensable.  The scope passed easily through the pylorus into the duodenum.  Retroflexed views revealed no abnormalities.     The scope was then withdrawn from the patient and the procedure completed.  COMPLICATIONS: There were no immediate complications.  ENDOSCOPIC IMPRESSION: Normal appearing esophagus and GE junction, the stomach was well visualized and normal in appearance, normal appearing duodenum  RECOMMENDATIONS: My office will arrange esophageal manometry with 24 hr pH/impedance study - to stay on all of current meds including PPI  dx dysphagia and heartburn despite PPI   eSigned:  Iva Booparl E Cassadee Vanzandt, MD, [facility] 09/28/2015 12:21 PM    CC: The Patient and Mary-Margaret Daphine DeutscherMartin, NP

## 2015-09-28 NOTE — Patient Instructions (Addendum)
The exam was normal. Your problems could be from abnormal function of the esophagus. I am going to arrange for you to have a set of tests called esophageal manometry and pH/imedance testing. My office will arrange.  I appreciate the opportunity to care for you. Iva Boop, MD, [facility]  What is an Impedance study?     This new technology allows [place] to further evaluate the esophagus (feeding tube in the chest) when patients have symptoms that may or may not be related to acid reflux. This will help your doctor determine the cause of your symptoms.  There are other movements of air/gas and liquid (or a mixture) within the esophagus that may give you symptoms but are not caused by acid reflux. Impedance testing can tell us whether it is that (or acid reflux) that is causing your symptoms.  How is the Impedance study performed?  Manometry is done first - this test involves placement of a soft catheter/tube through the nose and down into the esophagus. The nurse then asks you to take 10 swallows of water.  After that you leave the Endoscopy suite with the catheter/tube taped in place and you return the next day for removal of the tube and to bring back the recording device.  What preparation is required?   You may have a very light meal and/or just liquids up until 4 hours before your appointment but then you should not eat or drink anything for 4 hours before your appointment. You may take your usual morning medications. You should remain on all your usual medications before and during the test.  What should you expect during and after the procedure?   A thin flexible tube is lubricated and then gently passed through a nostril down to the correct level in the esophagus. You may experience gagging or coughing briefly during the insertion of the tube but it will soon pass. You will leave the Endoscopy suite with a tube coming out of your nose (and taped across your cheek).  How does it  work?   You will carry a recording device with you and you will receive instructions on how to use that. The messages from the Impedance catheter/tube are sent to the recording device and after you return to the Endoscopy suite we remove the tube and download the information from the recorder into the computer for the doctor to review.  What will you need to do?   We ask that you carry on with a normal day including eating and drinking normally as well as participating in your usual activities. We do not want you to avoid things that usually make you have your typical symptoms. You should stay on all your medications.  Your doctor will be sent the results of your study.    YOU HAD AN ENDOSCOPIC PROCEDURE TODAY AT THE [facility]:   Refer to the procedure report that was given to you for any specific questions about what was found during the examination.  If the procedure report does not answer your questions, please call your gastroenterologist to clarify.  If you requested that your care partner not be given the details of your procedure findings, then the procedure report has been included in a sealed envelope for you to review at your convenience later.  YOU SHOULD EXPECT: Some feelings of bloating in the abdomen. Passage of more gas than usual.  Walking can help get rid of the air that was put into your GI tract  during the procedure and reduce the bloating. If you had a lower endoscopy (such as a colonoscopy or flexible sigmoidoscopy) you may notice spotting of blood in your stool or on the toilet paper. If you underwent a bowel prep for your procedure, you may not have a normal bowel movement for a few days.  Please Note:  You might notice some irritation and congestion in your nose or some drainage.  This is from the oxygen used during your procedure.  There is no need for concern and it should clear up in a day or so.  SYMPTOMS TO REPORT IMMEDIATELY:     Following upper  endoscopy (EGD)  Vomiting of blood or coffee ground material  New chest pain or pain under the shoulder blades  Painful or persistently difficult swallowing  New shortness of breath  Fever of 100F or higher  Black, tarry-looking stools  For urgent or emergent issues, a gastroenterologist can be reached at any hour by calling [phone].   DIET: Your first meal following the procedure should be a small meal and then it is ok to progress to your normal diet. Heavy or fried foods are harder to digest and may make you feel nauseous or bloated.  Likewise, meals heavy in dairy and vegetables can increase bloating.  Drink plenty of fluids but you should avoid alcoholic beverages for 24 hours.  ACTIVITY:  You should plan to take it easy for the rest of today and you should NOT DRIVE or use heavy machinery until tomorrow (because of the sedation medicines used during the test).    FOLLOW UP: Our staff will call the number listed on your records the next business day following your procedure to check on you and address any questions or concerns that you may have regarding the information given to you following your procedure. If we do not reach you, we will leave a message.  However, if you are feeling well and you are not experiencing any problems, there is no need to return our call.  We will assume that you have returned to your regular daily activities without incident.  If any biopsies were taken you will be contacted by phone or by letter within the next 1-3 weeks.  Please call us at [phone] if you have not heard about the biopsies in 3 weeks.    SIGNATURES/CONFIDENTIALITY: You and/or your care partner have signed paperwork which will be entered into your electronic medical record.  These signatures attest to the fact that that the information above on your After Visit Summary has been reviewed and is understood.  Full responsibility of the confidentiality of this discharge information  lies with you and/or your care-partner.   Resume medications.

## 2015-09-28 NOTE — Progress Notes (Signed)
Stable to RR 

## 2015-09-28 NOTE — Telephone Encounter (Signed)
Patient notified of 24 hour impedence [facility]  Instructions mailed to the patient also

## 2015-10-01 ENCOUNTER — Telehealth: Payer: Self-pay

## 2015-10-01 NOTE — Telephone Encounter (Signed)
Left message on answering machine. 

## 2015-10-02 ENCOUNTER — Ambulatory Visit: Payer: 59 | Admitting: Registered Nurse

## 2015-10-05 ENCOUNTER — Encounter: Payer: Self-pay | Admitting: Registered Nurse

## 2015-10-05 ENCOUNTER — Encounter: Payer: BLUE CROSS/BLUE SHIELD | Attending: Physical Medicine & Rehabilitation | Admitting: Registered Nurse

## 2015-10-05 VITALS — BP 123/70 | HR 104

## 2015-10-05 DIAGNOSIS — M329 Systemic lupus erythematosus, unspecified: Secondary | ICD-10-CM | POA: Insufficient documentation

## 2015-10-05 DIAGNOSIS — M797 Fibromyalgia: Secondary | ICD-10-CM | POA: Insufficient documentation

## 2015-10-05 DIAGNOSIS — R569 Unspecified convulsions: Secondary | ICD-10-CM | POA: Insufficient documentation

## 2015-10-05 DIAGNOSIS — R0781 Pleurodynia: Secondary | ICD-10-CM

## 2015-10-05 DIAGNOSIS — L930 Discoid lupus erythematosus: Secondary | ICD-10-CM

## 2015-10-05 DIAGNOSIS — D8989 Other specified disorders involving the immune mechanism, not elsewhere classified: Secondary | ICD-10-CM

## 2015-10-05 DIAGNOSIS — M6249 Contracture of muscle, multiple sites: Secondary | ICD-10-CM

## 2015-10-05 DIAGNOSIS — Z79899 Other long term (current) drug therapy: Secondary | ICD-10-CM

## 2015-10-05 DIAGNOSIS — M545 Low back pain: Secondary | ICD-10-CM | POA: Diagnosis not present

## 2015-10-05 DIAGNOSIS — G894 Chronic pain syndrome: Secondary | ICD-10-CM

## 2015-10-05 DIAGNOSIS — M62838 Other muscle spasm: Secondary | ICD-10-CM

## 2015-10-05 DIAGNOSIS — Z5181 Encounter for therapeutic drug level monitoring: Secondary | ICD-10-CM

## 2015-10-05 MED ORDER — OXYCODONE-ACETAMINOPHEN 7.5-325 MG PO TABS: ORAL_TABLET | ORAL | Status: DC

## 2015-10-05 NOTE — Progress Notes (Signed)
Subjective:    Patient ID: Autumn Davis, female    DOB: 1986/03/11, 29 y.o.   MRN: [id number]  HPI: [name] is a 29 year old female who returns for follow up for chronic pain and medication refill. She says her pain is in her rib cage on the right side,mid-back and left knee. She rates her pain 8. Her current exercise regime is performing stretching exercises and walking.  Pain Inventory Average Pain 8 Pain Right Now 8 My pain is constant, sharp, burning, dull, stabbing, tingling and aching  In the last 24 hours, has pain interfered with the following? General activity 7 Relation with others 7 Enjoyment of life 7 What TIME of day is your pain at its worst? Evening and Night Sleep (in general) Poor  Pain is worse with: walking, bending, sitting, standing and some activites Pain improves with: rest, heat/ice, therapy/exercise, pacing activities, medication and TENS Relief from Meds: 4  Mobility walk without assistance walk with assistance use a cane use a walker do you drive?  no use a wheelchair transfers alone  Function I need assistance with the following:  dressing, meal prep, household duties and shopping  Neuro/Psych bladder control problems weakness numbness tremor tingling trouble walking spasms dizziness  Prior Studies Any changes since last visit?  no  Physicians involved in your care Any changes since last visit?  no   Family History  Problem Relation Age of Onset  . Thyroid disease Mother   . Lung cancer Maternal Grandmother   . Cancer Paternal Grandmother     colon/pancreatiec/lymphoma  . Irritable bowel syndrome Brother   . Colon polyps Father    Social History   Social History  . Marital Status: Married    Spouse Name: N/A  . Number of Children: N/A  . Years of Education: N/A   Occupational History  . disabled    Social History Main Topics  . Smoking status: Never Smoker   . Smokeless tobacco: Never Used  .  Alcohol Use: No  . Drug Use: No  . Sexual Activity: Not Asked   Other Topics Concern  . None   Social History Narrative   Past Surgical History  Procedure Laterality Date  . Knee surgery  2002/2003    bil  . Rectal surgery  correction of prolapse    2010  . Colonoscopy     Past Medical History  Diagnosis Date  . GERD (gastroesophageal reflux disease)   . Hyperlipidemia   . Seizures (HCC)   . Thyroid disease   . Bronchitis, chronic/intermittent 01/22/2012  . Depression 01/22/2012  . IBS (irritable bowel syndrome) 01/22/2012  . Neuromuscular disorder (HCC)   . SOB (shortness of breath)   . Lupus (HCC)   . Allergy   . Arthritis     HANDS,HIPS,KNEES   BP 123/70 mmHg  Pulse 104  SpO2 100%  Opioid Risk Score:   Fall Risk Score:  `1  Depression screen PHQ 2/9  Depression screen [facility] 2/9 06/12/2015 03/29/2015 02/16/2015 03/13/2014  Decreased Interest 1 1 1 1   Down, Depressed, Hopeless 0 0 0 1  PHQ - 2 Score 1 1 1 2   Altered sleeping - - 3 0  Tired, decreased energy - - 1 0  Change in appetite - - 1 0  Feeling bad or failure about yourself  - - 0 0  Trouble concentrating - - 1 0  Moving slowly or fidgety/restless - - 0 0  Suicidal thoughts - -  0 0  PHQ-9 Score - - 7 2     Review of Systems  Genitourinary:       Bladder Control Problems  Musculoskeletal:       Spasms  Neurological: Positive for dizziness, tremors, weakness and numbness.       Tingling Gait Instability  All other systems reviewed and are negative.      Objective:   Physical Exam  Constitutional: She is oriented to person, place, and time. She appears well-developed and well-nourished.  HENT:  Head: Normocephalic and atraumatic.  Neck: Normal range of motion. Neck supple.  Cardiovascular: Normal rate and regular rhythm.   Pulmonary/Chest: Effort normal and breath sounds normal.  Musculoskeletal:  Normal Muscle Bulk and Muscle Testing Reveals: Upper Extremities: Full ROM and Muscle Strength  5/5 Thoracic Paraspinal Tenderness: T-7- T-9 Lower Extremities: Full ROM and Muscle Strength 5/5 Arises from chair slowly using straight cane for support Narrow Based Gait   Neurological: She is alert and oriented to person, place, and time.  Skin: Skin is warm and dry.  Psychiatric: She has a normal mood and affect.  Nursing note and vitals reviewed.         Assessment & Plan:  1. Chronic seizure disorder: No seizure's. Continue Gabapentin. Neurology Following  2. Chronic muscle spasms, weakness with associated pain disorder: Continue Baclofen and MOBIC. Continue with Exercise regime. 3. Chronic dysphagia: GI Following  4. Anxiety with depression : Stable. Continue Elavil  5. Fibromyalgia/ Chronic Pain: Continue with exercise and heat Therapy.  Refilled: oxyCODONE 7.5/325mg  one tablet every 6 hours as needed #120.  6. Peripheral Neuropathy: Continue Gabapentin  20 minutes of face to face patient care time was spent during this visit. All questions were encouraged and answered

## 2015-10-29 ENCOUNTER — Ambulatory Visit (HOSPITAL_COMMUNITY)
Admission: RE | Admit: 2015-10-29 | Discharge: 2015-10-29 | Disposition: A | Discharge status: Home / Self Care | Payer: BLUE CROSS/BLUE SHIELD | Source: Ambulatory Visit | Attending: Internal Medicine | Admitting: Internal Medicine

## 2015-10-29 ENCOUNTER — Encounter (HOSPITAL_COMMUNITY)
Admission: RE | Disposition: A | Discharge status: Home / Self Care | Payer: Self-pay | Source: Ambulatory Visit | Attending: Internal Medicine

## 2015-10-29 DIAGNOSIS — K219 Gastro-esophageal reflux disease without esophagitis: Secondary | ICD-10-CM | POA: Diagnosis not present

## 2015-10-29 DIAGNOSIS — R1310 Dysphagia, unspecified: Secondary | ICD-10-CM | POA: Insufficient documentation

## 2015-10-29 DIAGNOSIS — K228 Other specified diseases of esophagus: Secondary | ICD-10-CM | POA: Insufficient documentation

## 2015-10-29 HISTORY — PX: 24 HOUR PH STUDY: SHX5419

## 2015-10-29 HISTORY — PX: ESOPHAGEAL MANOMETRY: SHX5429

## 2015-10-29 SURGERY — MANOMETRY, ESOPHAGUS
Anesthesia: Topical

## 2015-10-29 MED ORDER — LIDOCAINE VISCOUS 2 % MT SOLN
OROMUCOSAL | Status: AC
  Filled 2015-10-29: qty 15

## 2015-10-29 SURGICAL SUPPLY — 2 items
FACESHIELD LNG OPTICON STERILE (SAFETY) IMPLANT
GLOVE BIO SURGEON STRL SZ8 (GLOVE) ×4 IMPLANT

## 2015-10-30 ENCOUNTER — Encounter (HOSPITAL_COMMUNITY): Payer: Self-pay | Admitting: Internal Medicine

## 2015-11-06 ENCOUNTER — Encounter: Payer: BLUE CROSS/BLUE SHIELD | Admitting: Registered Nurse

## 2015-11-13 ENCOUNTER — Encounter: Payer: Self-pay | Admitting: Registered Nurse

## 2015-11-13 ENCOUNTER — Encounter: Payer: BLUE CROSS/BLUE SHIELD | Attending: Physical Medicine & Rehabilitation | Admitting: Registered Nurse

## 2015-11-13 VITALS — BP 129/67 | HR 76

## 2015-11-13 DIAGNOSIS — L930 Discoid lupus erythematosus: Secondary | ICD-10-CM | POA: Diagnosis not present

## 2015-11-13 DIAGNOSIS — Z79899 Other long term (current) drug therapy: Secondary | ICD-10-CM

## 2015-11-13 DIAGNOSIS — M797 Fibromyalgia: Secondary | ICD-10-CM

## 2015-11-13 DIAGNOSIS — R0781 Pleurodynia: Secondary | ICD-10-CM

## 2015-11-13 DIAGNOSIS — M329 Systemic lupus erythematosus, unspecified: Secondary | ICD-10-CM | POA: Insufficient documentation

## 2015-11-13 DIAGNOSIS — M62838 Other muscle spasm: Secondary | ICD-10-CM

## 2015-11-13 DIAGNOSIS — R569 Unspecified convulsions: Secondary | ICD-10-CM | POA: Diagnosis not present

## 2015-11-13 DIAGNOSIS — G894 Chronic pain syndrome: Secondary | ICD-10-CM

## 2015-11-13 DIAGNOSIS — M6249 Contracture of muscle, multiple sites: Secondary | ICD-10-CM

## 2015-11-13 DIAGNOSIS — Z5181 Encounter for therapeutic drug level monitoring: Secondary | ICD-10-CM

## 2015-11-13 DIAGNOSIS — M545 Low back pain: Secondary | ICD-10-CM

## 2015-11-13 DIAGNOSIS — D8989 Other specified disorders involving the immune mechanism, not elsewhere classified: Secondary | ICD-10-CM

## 2015-11-13 MED ORDER — OXYCODONE-ACETAMINOPHEN 7.5-325 MG PO TABS: ORAL_TABLET | ORAL | Status: DC

## 2015-11-13 NOTE — Progress Notes (Signed)
Subjective:    Patient ID: Autumn Davis, female    DOB: 05-Dec-1985, 29 y.o.   MRN: [id number]  HPI: Autumn Davis is a 29 year old female who returns for follow up for chronic pain and medication refill. She says her pain is in her ribs mainly on the right side. She rates her pain 8. Her current exercise regime is performing stretching exercises and walking.  Pain Inventory Average Pain 9 Pain Right Now 8 My pain is intermittent, constant, sharp, burning, dull and stabbing  In the last 24 hours, has pain interfered with the following? General activity 8 Relation with others 8 Enjoyment of life 8 What TIME of day is your pain at its worst? night Sleep (in general) Poor  Pain is worse with: walking, bending, sitting, standing and some activites Pain improves with: rest, heat/ice, therapy/exercise and medication Relief from Meds: 2  Mobility walk without assistance walk with assistance use a cane use a walker do you drive?  no  Function I need assistance with the following:  meal prep, household duties and shopping  Neuro/Psych weakness numbness tremor tingling trouble walking spasms dizziness  Prior Studies Any changes since last visit?  no  Physicians involved in your care Any changes since last visit?  no   Family History  Problem Relation Age of Onset  . Thyroid disease Mother   . Lung cancer Maternal Grandmother   . Cancer Paternal Grandmother     colon/pancreatiec/lymphoma  . Irritable bowel syndrome Brother   . Colon polyps Father    Social History   Social History  . Marital Status: Married    Spouse Name: N/A  . Number of Children: N/A  . Years of Education: N/A   Occupational History  . disabled    Social History Main Topics  . Smoking status: Never Smoker   . Smokeless tobacco: Never Used  . Alcohol Use: No  . Drug Use: No  . Sexual Activity: Not Asked   Other Topics Concern  . None   Social History Narrative   Past  Surgical History  Procedure Laterality Date  . Knee surgery  2002/2003    bil  . Rectal surgery  correction of prolapse    2010  . Colonoscopy    . Esophageal manometry N/A 10/29/2015    Procedure: ESOPHAGEAL MANOMETRY (EM);  Surgeon: Iva Boop, MD;  Location: [facility];  Service: Endoscopy;  Laterality: N/A;  . 24 hour ph study N/A 10/29/2015    Procedure: 24 HOUR PH STUDY;  Surgeon: Iva Boop, MD;  Location: [facility];  Service: Endoscopy;  Laterality: N/A;   Past Medical History  Diagnosis Date  . GERD (gastroesophageal reflux disease)   . Hyperlipidemia   . Seizures (HCC)   . Thyroid disease   . Bronchitis, chronic/intermittent 01/22/2012  . Depression 01/22/2012  . IBS (irritable bowel syndrome) 01/22/2012  . Neuromuscular disorder (HCC)   . SOB (shortness of breath)   . Lupus (HCC)   . Allergy   . Arthritis     HANDS,HIPS,KNEES   BP 129/67 mmHg  Pulse 76  SpO2 95%  Opioid Risk Score:   Fall Risk Score:  `1  Depression screen PHQ 2/9  Depression screen [facility] 2/9 06/12/2015 03/29/2015 02/16/2015 03/13/2014  Decreased Interest 1 1 1 1   Down, Depressed, Hopeless 0 0 0 1  PHQ - 2 Score 1 1 1 2   Altered sleeping - - 3 0  Tired, decreased energy - -  1 0  Change in appetite - - 1 0  Feeling bad or failure about yourself  - - 0 0  Trouble concentrating - - 1 0  Moving slowly or fidgety/restless - - 0 0  Suicidal thoughts - - 0 0  PHQ-9 Score - - 7 2    Review of Systems  All other systems reviewed and are negative.      Objective:   Physical Exam  Constitutional: She is oriented to person, place, and time. She appears well-developed and well-nourished.  HENT:  Head: Normocephalic and atraumatic.  Neck: Normal range of motion. Neck supple.  Cardiovascular: Normal rate and regular rhythm.   Pulmonary/Chest: Effort normal and breath sounds normal.  Musculoskeletal:  Normal Muscle Bulk and Muscle Testing Reveals: Upper Extremities: Decreased ROM 90  Degrees and Muscle Strength 5/5 Thoracic Paraspinal Tenderness: T-7- T-9 Lower Extremities: Full ROM an Muscle Strength 5/5 Arises from chair with ease Narrow Based Gait  Neurological: She is alert and oriented to person, place, and time.  Skin: Skin is warm and dry.  Psychiatric: She has a normal mood and affect.  Nursing note and vitals reviewed.         Assessment & Plan:  1. Chronic seizure disorder: No seizure's. Continue Gabapentin. Neurology Following  2. Chronic muscle spasms, weakness with associated pain disorder: Continue Baclofen and MOBIC. Continue with Exercise regime. 3. Chronic dysphagia: GI Following  4. Anxiety with depression : Stable. Continue Elavil  5. Fibromyalgia/ Chronic Pain: Continue with exercise and heat Therapy.  Refilled: oxyCODONE 7.5/325mg  one tablet every 6 hours as needed #120.  6. Peripheral Neuropathy: Continue Gabapentin  20 minutes of face to face patient care time was spent during this visit. All questions were encouraged and answered

## 2015-11-16 ENCOUNTER — Other Ambulatory Visit: Payer: Self-pay | Admitting: Registered Nurse

## 2015-11-16 ENCOUNTER — Other Ambulatory Visit: Payer: Self-pay | Admitting: Nurse Practitioner

## 2015-11-27 ENCOUNTER — Other Ambulatory Visit: Payer: Self-pay | Admitting: Nurse Practitioner

## 2015-11-27 ENCOUNTER — Other Ambulatory Visit: Payer: Self-pay | Admitting: Registered Nurse

## 2015-11-28 ENCOUNTER — Other Ambulatory Visit: Payer: Self-pay | Admitting: *Deleted

## 2015-11-28 MED ORDER — MELOXICAM 15 MG PO TABS: ORAL_TABLET | ORAL | Status: DC

## 2015-11-28 MED ORDER — PREDNISONE 5 MG PO TABS: ORAL_TABLET | ORAL | Status: DC

## 2015-11-28 NOTE — Telephone Encounter (Signed)
I spoke with Baird Lyonsasey and she is still taking her prednisone daily.  Refill ok'd per Riley LamEunice.

## 2015-12-10 ENCOUNTER — Encounter: Payer: BLUE CROSS/BLUE SHIELD | Admitting: Registered Nurse

## 2015-12-13 DIAGNOSIS — R1310 Dysphagia, unspecified: Secondary | ICD-10-CM | POA: Insufficient documentation

## 2015-12-14 ENCOUNTER — Encounter: Payer: BLUE CROSS/BLUE SHIELD | Attending: Physical Medicine & Rehabilitation | Admitting: Registered Nurse

## 2015-12-14 ENCOUNTER — Encounter: Payer: Self-pay | Admitting: Registered Nurse

## 2015-12-14 VITALS — BP 125/75 | HR 86 | Resp 14

## 2015-12-14 DIAGNOSIS — D8989 Other specified disorders involving the immune mechanism, not elsewhere classified: Secondary | ICD-10-CM

## 2015-12-14 DIAGNOSIS — R569 Unspecified convulsions: Secondary | ICD-10-CM | POA: Insufficient documentation

## 2015-12-14 DIAGNOSIS — Z5181 Encounter for therapeutic drug level monitoring: Secondary | ICD-10-CM

## 2015-12-14 DIAGNOSIS — M6249 Contracture of muscle, multiple sites: Secondary | ICD-10-CM | POA: Diagnosis not present

## 2015-12-14 DIAGNOSIS — M329 Systemic lupus erythematosus, unspecified: Secondary | ICD-10-CM | POA: Diagnosis not present

## 2015-12-14 DIAGNOSIS — G894 Chronic pain syndrome: Secondary | ICD-10-CM

## 2015-12-14 DIAGNOSIS — L930 Discoid lupus erythematosus: Secondary | ICD-10-CM | POA: Diagnosis not present

## 2015-12-14 DIAGNOSIS — M62838 Other muscle spasm: Secondary | ICD-10-CM

## 2015-12-14 DIAGNOSIS — R0781 Pleurodynia: Secondary | ICD-10-CM

## 2015-12-14 DIAGNOSIS — M545 Low back pain: Secondary | ICD-10-CM | POA: Diagnosis not present

## 2015-12-14 DIAGNOSIS — Z79899 Other long term (current) drug therapy: Secondary | ICD-10-CM

## 2015-12-14 DIAGNOSIS — M797 Fibromyalgia: Secondary | ICD-10-CM | POA: Insufficient documentation

## 2015-12-14 MED ORDER — OXYCODONE-ACETAMINOPHEN 7.5-325 MG PO TABS: ORAL_TABLET | ORAL | Status: DC

## 2015-12-14 MED ORDER — OXYCODONE-ACETAMINOPHEN 7.5-325 MG PO TABS: 1.0000 | ORAL_TABLET | Freq: Every day | ORAL | Status: DC | PRN

## 2015-12-14 NOTE — Progress Notes (Signed)
Subjective:    Patient ID: Autumn Davis, female    DOB: [birth year], 30 y.o.   MRN: [id number]  HPI: Autumn Davis is a 30 year old female who returns for follow up appointment for chronic pain and medication refill. She says her pain is located in her ribs mainly on the right side. Also states for the last few weeks  her rib pain has intensified at night and has become very sensitive with palpation. She rates her pain 8. Her current exercise regime is performing stretching exercises and walking.  Pain Inventory Average Pain 8 Pain Right Now 8 My pain is constant, aching and throbbing  In the last 24 hours, has pain interfered with the following? General activity 7 Relation with others 7 Enjoyment of life 7 What TIME of day is your pain at its worst? evening, night Sleep (in general) Poor  Pain is worse with: walking, bending, sitting, standing and some activites Pain improves with: rest, heat/ice, therapy/exercise, pacing activities, medication and TENS Relief from Meds: 3  Mobility walk without assistance walk with assistance use a cane use a walker do you drive?  no use a wheelchair transfers alone  Function I need assistance with the following:  dressing, meal prep, household duties and shopping  Neuro/Psych bladder control problems weakness numbness tremor tingling trouble walking spasms dizziness  Prior Studies Any changes since last visit?  no  Physicians involved in your care Any changes since last visit?  no   Family History  Problem Relation Age of Onset  . Thyroid disease Mother   . Lung cancer Maternal Grandmother   . Cancer Paternal Grandmother     colon/pancreatiec/lymphoma  . Irritable bowel syndrome Brother   . Colon polyps Father    Social History   Social History  . Marital Status: Married    Spouse Name: N/A  . Number of Children: N/A  . Years of Education: N/A   Occupational History  . disabled    Social History  Main Topics  . Smoking status: Never Smoker   . Smokeless tobacco: Never Used  . Alcohol Use: No  . Drug Use: No  . Sexual Activity: Not Asked   Other Topics Concern  . None   Social History Narrative   Past Surgical History  Procedure Laterality Date  . Knee surgery  2002/2003    bil  . Rectal surgery  correction of prolapse    2010  . Colonoscopy    . Esophageal manometry N/A 10/29/2015    Procedure: ESOPHAGEAL MANOMETRY (EM);  Surgeon: Iva Boop, MD;  Location: [facility];  Service: Endoscopy;  Laterality: N/A;  . 24 hour ph study N/A 10/29/2015    Procedure: 24 HOUR PH STUDY;  Surgeon: Iva Boop, MD;  Location: [facility];  Service: Endoscopy;  Laterality: N/A;   Past Medical History  Diagnosis Date  . GERD (gastroesophageal reflux disease)   . Hyperlipidemia   . Seizures (HCC)   . Thyroid disease   . Bronchitis, chronic/intermittent 01/22/2012  . Depression 01/22/2012  . IBS (irritable bowel syndrome) 01/22/2012  . Neuromuscular disorder (HCC)   . SOB (shortness of breath)   . Lupus (HCC)   . Allergy   . Arthritis     HANDS,HIPS,KNEES   BP 125/75 mmHg  Pulse 86  Resp 14  SpO2 97%  Opioid Risk Score:   Fall Risk Score:  `1  Depression screen PHQ 2/9  Depression screen [facility] 2/9 06/12/2015 03/29/2015 02/16/2015  03/13/2014  Decreased Interest 1 1 1 1   Down, Depressed, Hopeless 0 0 0 1  PHQ - 2 Score 1 1 1 2   Altered sleeping - - 3 0  Tired, decreased energy - - 1 0  Change in appetite - - 1 0  Feeling bad or failure about yourself  - - 0 0  Trouble concentrating - - 1 0  Moving slowly or fidgety/restless - - 0 0  Suicidal thoughts - - 0 0  PHQ-9 Score - - 7 2     Review of Systems  All other systems reviewed and are negative.      Objective:   Physical Exam  Constitutional: She is oriented to person, place, and time. She appears well-developed and well-nourished.  HENT:  Head: Normocephalic and atraumatic.  Neck: Normal range of motion.  Neck supple.  Cardiovascular: Normal rate and regular rhythm.   Pulmonary/Chest: Effort normal and breath sounds normal.  Musculoskeletal:  Normal Muscle Bulk and Muscle Testing Reveals: Upper Extremities: Full ROM and Muscle Strength 5/5 Thoracic Paraspinal Tenderness: T-7- T-8 Mainly Right Side. Hypersensitivity with Palpation Lower Extremities: Full ROM and Muscle Strength 5/5 Arises from Chair Slowly Antalgic Gait  Neurological: She is alert and oriented to person, place, and time.  Skin: Skin is warm and dry.  Psychiatric: She has a normal mood and affect.  Nursing note and vitals reviewed.         Assessment & Plan:  1. Chronic seizure disorder: No seizure's. Continue Gabapentin. Neurology Following  2. Chronic muscle spasms, weakness with associated pain disorder: Continue Baclofen and MOBIC. Continue with Exercise regime. 3. Chronic dysphagia: GI Following  4. Anxiety with depression : Stable. Continue Elavil  5. Fibromyalgia/ Chronic Pain: Continue with exercise and heat Therapy.  Refilled:Increased oxyCODONE 7.5/325mg  one tablet 5 times  daily as needed  #150.  6. Peripheral Neuropathy: Continue Gabapentin  20 minutes of face to face patient care time was spent during this visit. All questions were encouraged and answered

## 2015-12-17 ENCOUNTER — Other Ambulatory Visit: Payer: Self-pay | Admitting: Nurse Practitioner

## 2015-12-20 LAB — TOXASSURE SELECT,+ANTIDEPR,UR: PDF: 0

## 2015-12-21 NOTE — Progress Notes (Signed)
Urine drug screen for this encounter is consistent for prescribed medication 

## 2015-12-27 ENCOUNTER — Other Ambulatory Visit: Payer: Self-pay | Admitting: *Deleted

## 2015-12-27 MED ORDER — GABAPENTIN 600 MG PO TABS: 600.0000 mg | ORAL_TABLET | Freq: Four times a day (QID) | ORAL | Status: DC

## 2015-12-31 ENCOUNTER — Ambulatory Visit: Payer: BLUE CROSS/BLUE SHIELD | Admitting: Nurse Practitioner

## 2016-01-09 ENCOUNTER — Encounter
Payer: BLUE CROSS/BLUE SHIELD | Attending: Physical Medicine & Rehabilitation | Admitting: Physical Medicine & Rehabilitation

## 2016-01-09 ENCOUNTER — Encounter: Payer: BLUE CROSS/BLUE SHIELD | Admitting: Physical Medicine & Rehabilitation

## 2016-01-09 ENCOUNTER — Encounter: Payer: Self-pay | Admitting: Physical Medicine & Rehabilitation

## 2016-01-09 VITALS — BP 116/75 | HR 90 | Resp 14

## 2016-01-09 DIAGNOSIS — M62838 Other muscle spasm: Secondary | ICD-10-CM

## 2016-01-09 DIAGNOSIS — M545 Low back pain, unspecified: Secondary | ICD-10-CM

## 2016-01-09 DIAGNOSIS — R1084 Generalized abdominal pain: Secondary | ICD-10-CM | POA: Diagnosis not present

## 2016-01-09 DIAGNOSIS — M6249 Contracture of muscle, multiple sites: Secondary | ICD-10-CM

## 2016-01-09 DIAGNOSIS — M797 Fibromyalgia: Secondary | ICD-10-CM

## 2016-01-09 DIAGNOSIS — R569 Unspecified convulsions: Secondary | ICD-10-CM | POA: Diagnosis present

## 2016-01-09 DIAGNOSIS — M5489 Other dorsalgia: Secondary | ICD-10-CM

## 2016-01-09 DIAGNOSIS — M329 Systemic lupus erythematosus, unspecified: Secondary | ICD-10-CM | POA: Insufficient documentation

## 2016-01-09 DIAGNOSIS — G8929 Other chronic pain: Secondary | ICD-10-CM

## 2016-01-09 MED ORDER — OXYCODONE-ACETAMINOPHEN 7.5-325 MG PO TABS: 1.0000 | ORAL_TABLET | Freq: Every day | ORAL | Status: DC | PRN

## 2016-01-09 NOTE — Patient Instructions (Signed)
PLEASE CALL ME WITH ANY PROBLEMS OR QUESTIONS (#[phone]).     CONTINUE WITH YOUR EXERCISES!!! YOU ARE DOING A GOOD JOB.

## 2016-01-09 NOTE — Progress Notes (Signed)
Subjective:    Patient ID: Autumn Davis, female    DOB: May 03, 1986, 30 y.o.   MRN: [id number]  HPI   Autumn Davis is here in follow up of her chronic pain and spasms. She has been doing fairly well over the last couple months. The warmer weather has been good for her. She is walking more than ever. She hurts the next day but is pleased with what she has been able to do.   She feels that the muscle spasm and jerking has been [facility] better. She still has the pain along her rib cage along either side. She asks if there is anything i can do for this pain.   She remains on her other medications as prescribed   Pain Inventory Average Pain 8 Pain Right Now 7 My pain is constant, sharp, burning, stabbing and tingling  In the last 24 hours, has pain interfered with the following? General activity 7 Relation with others 7 Enjoyment of life 7 What TIME of day is your pain at its worst? evening , night Sleep (in general) Poor  Pain is worse with: walking, bending, sitting, standing and some activites Pain improves with: rest, heat/ice, therapy/exercise, pacing activities, medication and TENS Relief from Meds: 3  Mobility walk without assistance walk with assistance use a cane use a walker do you drive?  no use a wheelchair transfers alone  Function I need assistance with the following:  meal prep, household duties and shopping  Neuro/Psych weakness numbness tremor tingling trouble walking spasms dizziness  Prior Studies Any changes since last visit?  no  Physicians involved in your care Any changes since last visit?  no   Family History  Problem Relation Age of Onset  . Thyroid disease Mother   . Lung cancer Maternal Grandmother   . Cancer Paternal Grandmother     colon/pancreatiec/lymphoma  . Irritable bowel syndrome Brother   . Colon polyps Father    Social History   Social History  . Marital Status: Married    Spouse Name: N/A  . Number of Children: N/A  . Years  of Education: N/A   Occupational History  . disabled    Social History Main Topics  . Smoking status: Never Smoker   . Smokeless tobacco: Never Used  . Alcohol Use: No  . Drug Use: No  . Sexual Activity: Not Asked   Other Topics Concern  . None   Social History Narrative   Past Surgical History  Procedure Laterality Date  . Knee surgery  2002/2003    bil  . Rectal surgery  correction of prolapse    2010  . Colonoscopy    . Esophageal manometry N/A 10/29/2015    Procedure: ESOPHAGEAL MANOMETRY (EM);  Surgeon: Iva Boop, MD;  Location: [facility];  Service: Endoscopy;  Laterality: N/A;  . 24 hour ph study N/A 10/29/2015    Procedure: 24 HOUR PH STUDY;  Surgeon: Iva Boop, MD;  Location: [facility];  Service: Endoscopy;  Laterality: N/A;   Past Medical History  Diagnosis Date  . GERD (gastroesophageal reflux disease)   . Hyperlipidemia   . Seizures (HCC)   . Thyroid disease   . Bronchitis, chronic/intermittent 01/22/2012  . Depression 01/22/2012  . IBS (irritable bowel syndrome) 01/22/2012  . Neuromuscular disorder (HCC)   . SOB (shortness of breath)   . Lupus (HCC)   . Allergy   . Arthritis     HANDS,HIPS,KNEES   BP 116/75 mmHg  Pulse 90  Resp 14  SpO2 98%  Opioid Risk Score:   Fall Risk Score:  `1  Depression screen PHQ 2/9  Depression screen [facility] 2/9 06/12/2015 03/29/2015 02/16/2015 03/13/2014  Decreased Interest 1 1 1 1   Down, Depressed, Hopeless 0 0 0 1  PHQ - 2 Score 1 1 1 2   Altered sleeping - - 3 0  Tired, decreased energy - - 1 0  Change in appetite - - 1 0  Feeling bad or failure about yourself  - - 0 0  Trouble concentrating - - 1 0  Moving slowly or fidgety/restless - - 0 0  Suicidal thoughts - - 0 0  PHQ-9 Score - - 7 2     Review of Systems  All other systems reviewed and are negative.      Objective:   Physical Exam  General: Alert and oriented x 3, No apparent distress. Has gained some weight.  HEENT: Head is normocephalic,  atraumatic, PERRLA, EOMI, sclera anicteric, oral mucosa pink and moist, dentition intact, ext ear canals clear,  Neck: Supple without JVD or lymphadenopathy  Heart: Reg rate and rhythm. No murmurs rubs or gallops  Chest: CTA bilaterally without wheezes, rales, or rhonchi; no distress  Abdomen: Soft, non-tender, non-distended, bowel sounds positive.  Extremities: No clubbing, cyanosis, or edema. Pulses are 2+  Skin: Clean and intact without signs of breakdown. She has some mottling over the skin of the lower back---no rash,breakdown, non-tender.  Neuro: Pt displays generally intact insight and awareness. CN exam appears intact without focal abnormalities today. She has good vocal quality, cough. Vision intact. Strength is grossly 4 to 4+/5 in the upper limbs with improved ROM noted-- Sensation is decreased below the waist bilaterally, distal more than proximal to PP/LT--unchanged. She has decreased proprioception in both legs as well. Muscle tightness/tone appears normal today. DTR's remain today in general at 1+. She still favors the left leg with weight bearing.  Musculoskeletal: She has generalized joint pain but now swelling or gross abnormalities. She has ongoing, non-specific tenderness in the right >left lower rib cage/axillary line. There is no specific rib or location, it is more over 2-3 rib levels along the axillary to nipple line.  Psych: appears more alert. Pleasant and appropriate.  Assessment & Plan:   1. Chronic seizure disorder---? myoclonic jerks/myotonia/ or some other abnormal muscle abnormality  2. 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  3. Chronic dysphagia  4. Right low back/flank pain  5. Fibromyalgia syndrome  6. Anxiety with depression  7. Pulmonary function abnormalities., ?CO2 retention? ---recent normal ECHO from 09/15/14    Plan:  1. Continue Elavil at 75mg  qhs  2. Maintain baclofen to 20mg   for muscle spasms. Still can consider klonopin trial in the future. Continue with HEP.  3. Consider follow up rheum appt 4. Continue meloxicam 15mg  daily  5. Oxycodone 7.5mg  fr breakthrough pain #120.  6. Continue Gabapentin 600mg  QID for seizures, spasms, neuro pain---this appears to have helped.  7. Continue lidoderm patches for trunk/back pain.  8. Will make a referral for trial of PENS here at the office for as series of six treatments. This may be able to help her continued bilateral rib related musculoskeletal pain.   Follow up with NP or me in about one month. 15 minutes of face to face patient care time were spent during this visit. All questions were encouraged and answered.

## 2016-01-10 ENCOUNTER — Encounter: Payer: Self-pay | Admitting: Nurse Practitioner

## 2016-01-10 ENCOUNTER — Ambulatory Visit (INDEPENDENT_AMBULATORY_CARE_PROVIDER_SITE_OTHER): Payer: BLUE CROSS/BLUE SHIELD | Admitting: Nurse Practitioner

## 2016-01-10 VITALS — BP 118/78 | HR 85 | Temp 98.3°F | Ht 68.0 in | Wt 220.0 lb

## 2016-01-10 DIAGNOSIS — F32A Depression, unspecified: Secondary | ICD-10-CM

## 2016-01-10 DIAGNOSIS — E079 Disorder of thyroid, unspecified: Secondary | ICD-10-CM

## 2016-01-10 DIAGNOSIS — Z309 Encounter for contraceptive management, unspecified: Secondary | ICD-10-CM

## 2016-01-10 DIAGNOSIS — L930 Discoid lupus erythematosus: Secondary | ICD-10-CM | POA: Diagnosis not present

## 2016-01-10 DIAGNOSIS — K589 Irritable bowel syndrome without diarrhea: Secondary | ICD-10-CM

## 2016-01-10 DIAGNOSIS — E785 Hyperlipidemia, unspecified: Secondary | ICD-10-CM

## 2016-01-10 DIAGNOSIS — K219 Gastro-esophageal reflux disease without esophagitis: Secondary | ICD-10-CM

## 2016-01-10 DIAGNOSIS — M797 Fibromyalgia: Secondary | ICD-10-CM

## 2016-01-10 DIAGNOSIS — E559 Vitamin D deficiency, unspecified: Secondary | ICD-10-CM | POA: Diagnosis not present

## 2016-01-10 DIAGNOSIS — F329 Major depressive disorder, single episode, unspecified: Secondary | ICD-10-CM | POA: Diagnosis not present

## 2016-01-10 DIAGNOSIS — D8989 Other specified disorders involving the immune mechanism, not elsewhere classified: Secondary | ICD-10-CM

## 2016-01-10 NOTE — Progress Notes (Signed)
Subjective:    Patient ID: Satin Selan, female    DOB: Apr 30, 1986, 30 y.o.   MRN: [id number]  Patient here today for follow up of chronic medical problems.  Outpatient Encounter Prescriptions as of 01/10/2016  Medication Sig  . acetaZOLAMIDE (DIAMOX) 250 MG tablet Take 250 mg by mouth 3 (three) times daily. Take 250 mg two times a day; then 500 mg at bedtime  . albuterol (PROVENTIL HFA;VENTOLIN HFA) 108 (90 BASE) MCG/ACT inhaler Inhale 2 puffs into the lungs every 6 (six) hours as needed for wheezing or shortness of breath.  Marland Kitchen amitriptyline (ELAVIL) 75 MG tablet Take 1 tablet (75 mg total) by mouth at bedtime.  Marland Kitchen atorvastatin (LIPITOR) 40 MG tablet TAKE ONE (1) TABLET EACH DAY  . baclofen (LIORESAL) 20 MG tablet TAKE ONE TABLET FOUR TIMES DAILY  . Beclomethasone Dipropionate (QNASL CHILDRENS) 40 MCG/ACT AERS Place into the nose.  . Calcium-Magnesium-Vitamin D (CALCIUM 1200+D3 PO) Take 1 tablet by mouth daily.  . cetirizine (ZYRTEC) 10 MG tablet Take 10 mg by mouth daily.  . Cholecalciferol (VITAMIN D) 2000 UNITS tablet Take 2,000 Units by mouth daily.  . cycloSPORINE (RESTASIS) 0.05 % ophthalmic emulsion Place 1 drop into both eyes 2 (two) times daily.  Marland Kitchen docusate sodium (COLACE) 100 MG capsule Take 100 mg by mouth 2 (two) times daily. PRN  . EPINEPHrine (EPI-PEN) 0.3 mg/0.3 mL SOAJ injection Inject 0.3 mg into the muscle as needed.  . furosemide (LASIX) 20 MG tablet TAKE ONE (1) TABLET EACH DAY  . gabapentin (NEURONTIN) 600 MG tablet Take 1 tablet (600 mg total) by mouth 4 (four) times daily.  Marland Kitchen ipratropium-albuterol (DUONEB) 0.5-2.5 (3) MG/3ML SOLN Take 3 mLs by nebulization every 4 (four) hours as needed.  Marland Kitchen L-Methylfolate (DEPLIN) 15 MG TABS Take by mouth.  . lidocaine (LIDODERM) 5 % Place 3 patches onto the skin daily. Remove & Discard patch within 12 hours or as directed by MD  . medroxyPROGESTERone (DEPO-PROVERA) 150 MG/ML injection Inject 150 mg into the muscle as directed. Every 2  months  . meloxicam (MOBIC) 15 MG tablet TAKE ONE (1) TABLET EACH DAY  . metoprolol succinate (TOPROL-XL) 25 MG 24 hr tablet TAKE ONE (1) TABLET EACH DAY  . montelukast (SINGULAIR) 10 MG tablet TAKE ONE TABLET DAILY AT BEDTIME  . Omega 3 1000 MG CAPS Take 1 capsule by mouth daily.  Marland Kitchen omeprazole (PRILOSEC) 40 MG capsule TAKE ONE (1) CAPSULE EACH DAY  . oxyCODONE-acetaminophen (PERCOCET) 7.5-325 MG tablet Take 1 tablet by mouth 5 (five) times daily as needed for severe pain. No More Than 5 a day  . predniSONE (DELTASONE) 5 MG tablet TAKE 1 TABLET EVERY MORNING WITH BREAKFAST  . promethazine (PHENERGAN) 12.5 MG tablet Take 1 tablet (12.5 mg total) by mouth every 8 (eight) hours as needed for nausea or vomiting.  . vitamin E (VITAMIN E) 400 UNIT capsule Take 400 Units by mouth daily.   Facility-Administered Encounter Medications as of 01/10/2016  Medication  . medroxyPROGESTERone (DEPO-PROVERA) injection 150 mg     Hyperlipidemia This is a chronic problem. The current episode started more than 1 year ago. The problem is controlled. Recent lipid tests were reviewed and are normal. Factors aggravating her hyperlipidemia include fatty foods. Pertinent negatives include no chest pain. Current antihyperlipidemic treatment includes statins. The current treatment provides no improvement of lipids. There are no compliance problems.  Risk factors for coronary artery disease include dyslipidemia and obesity.  Thyroid Problem Presents for follow-up visit. Patient  reports no fatigue or palpitations. The symptoms have been stable. The treatment provided significant relief. Her past medical history is significant for hyperlipidemia.   GERD Currently managed with prilosec at this time, no side effects IBS Currently managed well, denies any complications at this visit Fibromyalgia States she is doing well, no recent flares of pain Depression Managing pretty well at this time, remains on Elavil with no side  effects Lupus  States she has good days and bad days, but managing ok at this time Autoimmune disorder Not real sure what is wrong with her- has dx of lupus on chart ut as not actually ben diagnosed with lupus- is seeing many specialist.    Review of Systems  Constitutional: Negative for activity change, appetite change and fatigue.  HENT: Negative.   Respiratory: Negative.   Cardiovascular: Negative for chest pain and palpitations.  Gastrointestinal: Negative.   Endocrine: Negative.   Genitourinary: Negative.   Skin: Negative.   Neurological: Negative for dizziness and light-headedness.  Psychiatric/Behavioral: Negative.   All other systems reviewed and are negative.      Objective:   Physical Exam  Constitutional: She is oriented to person, place, and time. She appears well-developed and well-nourished.  HENT:  Right Ear: External ear normal.  Left Ear: External ear normal.  Nose: Nose normal.  Mouth/Throat: Oropharynx is clear and moist.  Eyes: Pupils are equal, round, and reactive to light.  Neck: Normal range of motion. Neck supple.  Cardiovascular: Normal rate, regular rhythm and normal heart sounds.   Pulmonary/Chest: Effort normal and breath sounds normal.  Abdominal: Soft. Bowel sounds are normal.  Neurological: She is alert and oriented to person, place, and time. She has normal reflexes. No cranial nerve deficit.  Skin: Skin is warm and dry.  Psychiatric: She has a normal mood and affect. Her behavior is normal. Judgment and thought content normal.    BP 118/78 mmHg  Pulse 85  Temp(Src) 98.3 F (36.8 C) (Oral)  Ht 5\' 8"  (1.727 m)  Wt 220 lb (99.791 kg)  BMI 33.46 kg/m2      Assessment & Plan:   1. Gastroesophageal reflux disease without esophagitis   2. IBS (irritable bowel syndrome)   3. Gastroesophageal reflux disease, esophagitis presence not specified   4. Thyroid disease   5. Fibromyalgia   6. Depression   7. Hyperlipidemia   8. Inflammatory  autoimmune disorder   9. Vitamin D deficiency    Orders Placed This Encounter  Procedures  . CMP14+EGFR  . Lipid panel  . VITAMIN D 25 Hydroxy (Vit-D Deficiency, Fractures)   Keep follow  Up with specialist Labs pending Health maintenance reviewed Diet and exercise encouraged Continue all meds Follow up  In 3 month   Mary-Margaret Daphine Deutscher, FNP

## 2016-01-10 NOTE — Patient Instructions (Signed)
Health Maintenance, Female Adopting a healthy lifestyle and getting preventive care can go a long way to promote health and wellness. Talk with your health care provider about what schedule of regular examinations is right for you. This is a good chance for you to check in with your provider about disease prevention and staying healthy. In between checkups, there are plenty of things you can do on your own. Experts have done a lot of research about which lifestyle changes and preventive measures are most likely to keep you healthy. Ask your health care provider for more information. WEIGHT AND DIET  Eat a healthy diet  Be sure to include plenty of vegetables, fruits, low-fat dairy products, and lean protein.  Do not eat a lot of foods high in solid fats, added sugars, or salt.  Get regular exercise. This is one of the most important things you can do for your health.  Most adults should exercise for at least 150 minutes each week. The exercise should increase your heart rate and make you sweat (moderate-intensity exercise).  Most adults should also do strengthening exercises at least twice a week. This is in addition to the moderate-intensity exercise.  Maintain a healthy weight  Body mass index (BMI) is a measurement that can be used to identify possible weight problems. It estimates body fat based on height and weight. Your health care provider can help determine your BMI and help you achieve or maintain a healthy weight.  For females 20 years of age and older:   A BMI below 18.5 is considered underweight.  A BMI of 18.5 to 24.9 is normal.  A BMI of 25 to 29.9 is considered overweight.  A BMI of 30 and above is considered obese.  Watch levels of cholesterol and blood lipids  You should start having your blood tested for lipids and cholesterol at 32 years of age, then have this test every 5 years.  You may need to have your cholesterol levels checked more often if:  Your lipid  or cholesterol levels are high.  You are older than 32 years of age.  You are at high risk for heart disease.  CANCER SCREENING   Lung Cancer  Lung cancer screening is recommended for adults 55-80 years old who are at high risk for lung cancer because of a history of smoking.  A yearly low-dose CT scan of the lungs is recommended for people who:  Currently smoke.  Have quit within the past 15 years.  Have at least a 30-pack-year history of smoking. A pack year is smoking an average of one pack of cigarettes a day for 1 year.  Yearly screening should continue until it has been 15 years since you quit.  Yearly screening should stop if you develop a health problem that would prevent you from having lung cancer treatment.  Breast Cancer  Practice breast self-awareness. This means understanding how your breasts normally appear and feel.  It also means doing regular breast self-exams. Let your health care provider know about any changes, no matter how small.  If you are in your 20s or 30s, you should have a clinical breast exam (CBE) by a health care provider every 1-3 years as part of a regular health exam.  If you are 40 or older, have a CBE every year. Also consider having a breast X-ray (mammogram) every year.  If you have a family history of breast cancer, talk to your health care provider about genetic screening.  If you   are at high risk for breast cancer, talk to your health care provider about having an MRI and a mammogram every year.  Breast cancer gene (BRCA) assessment is recommended for women who have family members with BRCA-related cancers. BRCA-related cancers include:  Breast.  Ovarian.  Tubal.  Peritoneal cancers.  Results of the assessment will determine the need for genetic counseling and BRCA1 and BRCA2 testing. Cervical Cancer Your health care provider may recommend that you be screened regularly for cancer of the pelvic organs (ovaries, uterus, and  vagina). This screening involves a pelvic examination, including checking for microscopic changes to the surface of your cervix (Pap test). You may be encouraged to have this screening done every 3 years, beginning at age 21.  For women ages 30-65, health care providers may recommend pelvic exams and Pap testing every 3 years, or they may recommend the Pap and pelvic exam, combined with testing for human papilloma virus (HPV), every 5 years. Some types of HPV increase your risk of cervical cancer. Testing for HPV may also be done on women of any age with unclear Pap test results.  Other health care providers may not recommend any screening for nonpregnant women who are considered low risk for pelvic cancer and who do not have symptoms. Ask your health care provider if a screening pelvic exam is right for you.  If you have had past treatment for cervical cancer or a condition that could lead to cancer, you need Pap tests and screening for cancer for at least 20 years after your treatment. If Pap tests have been discontinued, your risk factors (such as having a new sexual partner) need to be reassessed to determine if screening should resume. Some women have medical problems that increase the chance of getting cervical cancer. In these cases, your health care provider may recommend more frequent screening and Pap tests. Colorectal Cancer  This type of cancer can be detected and often prevented.  Routine colorectal cancer screening usually begins at 32 years of age and continues through 32 years of age.  Your health care provider may recommend screening at an earlier age if you have risk factors for colon cancer.  Your health care provider may also recommend using home test kits to check for hidden blood in the stool.  A small camera at the end of a tube can be used to examine your colon directly (sigmoidoscopy or colonoscopy). This is done to check for the earliest forms of colorectal  cancer.  Routine screening usually begins at age 50.  Direct examination of the colon should be repeated every 5-10 years through 32 years of age. However, you may need to be screened more often if early forms of precancerous polyps or small growths are found. Skin Cancer  Check your skin from head to toe regularly.  Tell your health care provider about any new moles or changes in moles, especially if there is a change in a mole's shape or color.  Also tell your health care provider if you have a mole that is larger than the size of a pencil eraser.  Always use sunscreen. Apply sunscreen liberally and repeatedly throughout the day.  Protect yourself by wearing long sleeves, pants, a wide-brimmed hat, and sunglasses whenever you are outside. HEART DISEASE, DIABETES, AND HIGH BLOOD PRESSURE   High blood pressure causes heart disease and increases the risk of stroke. High blood pressure is more likely to develop in:  People who have blood pressure in the high end   of the normal range (130-139/85-89 mm Hg).  People who are overweight or obese.  People who are African American.  If you are 38-23 years of age, have your blood pressure checked every 3-5 years. If you are 61 years of age or older, have your blood pressure checked every year. You should have your blood pressure measured twice--once when you are at a hospital or clinic, and once when you are not at a hospital or clinic. Record the average of the two measurements. To check your blood pressure when you are not at a hospital or clinic, you can use:  An automated blood pressure machine at a pharmacy.  A home blood pressure monitor.  If you are between 45 years and 39 years old, ask your health care provider if you should take aspirin to prevent strokes.  Have regular diabetes screenings. This involves taking a blood sample to check your fasting blood sugar level.  If you are at a normal weight and have a low risk for diabetes,  have this test once every three years after 32 years of age.  If you are overweight and have a high risk for diabetes, consider being tested at a younger age or more often. PREVENTING INFECTION  Hepatitis B  If you have a higher risk for hepatitis B, you should be screened for this virus. You are considered at high risk for hepatitis B if:  You were born in a country where hepatitis B is common. Ask your health care provider which countries are considered high risk.  Your parents were born in a high-risk country, and you have not been immunized against hepatitis B (hepatitis B vaccine).  You have HIV or AIDS.  You use needles to inject street drugs.  You live with someone who has hepatitis B.  You have had sex with someone who has hepatitis B.  You get hemodialysis treatment.  You take certain medicines for conditions, including cancer, organ transplantation, and autoimmune conditions. Hepatitis C  Blood testing is recommended for:  Everyone born from [birth year] through 1967.  Anyone with known risk factors for hepatitis C. Sexually transmitted infections (STIs)  You should be screened for sexually transmitted infections (STIs) including gonorrhea and chlamydia if:  You are sexually active and are younger than 32 years of age.  You are older than 32 years of age and your health care provider tells you that you are at risk for this type of infection.  Your sexual activity has changed since you were last screened and you are at an increased risk for chlamydia or gonorrhea. Ask your health care provider if you are at risk.  If you do not have HIV, but are at risk, it may be recommended that you take a prescription medicine daily to prevent HIV infection. This is called pre-exposure prophylaxis (PrEP). You are considered at risk if:  You are sexually active and do not regularly use condoms or know the HIV status of your partner(s).  You take drugs by injection.  You are sexually  active with a partner who has HIV. Talk with your health care provider about whether you are at high risk of being infected with HIV. If you choose to begin PrEP, you should first be tested for HIV. You should then be tested every 3 months for as long as you are taking PrEP.  PREGNANCY   If you are premenopausal and you may become pregnant, ask your health care provider about preconception counseling.  If you may  become pregnant, take 400 to 800 micrograms (mcg) of folic acid every day.  If you want to prevent pregnancy, talk to your health care provider about birth control (contraception). OSTEOPOROSIS AND MENOPAUSE   Osteoporosis is a disease in which the bones lose minerals and strength with aging. This can result in serious bone fractures. Your risk for osteoporosis can be identified using a bone density scan.  If you are 61 years of age or older, or if you are at risk for osteoporosis and fractures, ask your health care provider if you should be screened.  Ask your health care provider whether you should take a calcium or vitamin D supplement to lower your risk for osteoporosis.  Menopause may have certain physical symptoms and risks.  Hormone replacement therapy may reduce some of these symptoms and risks. Talk to your health care provider about whether hormone replacement therapy is right for you.  HOME CARE INSTRUCTIONS   Schedule regular health, dental, and eye exams.  Stay current with your immunizations.   Do not use any tobacco products including cigarettes, chewing tobacco, or electronic cigarettes.  If you are pregnant, do not drink alcohol.  If you are breastfeeding, limit how much and how often you drink alcohol.  Limit alcohol intake to no more than 1 drink per day for nonpregnant women. One drink equals 12 ounces of beer, 5 ounces of wine, or 1 ounces of hard liquor.  Do not use street drugs.  Do not share needles.  Ask your health care provider for help if  you need support or information about quitting drugs.  Tell your health care provider if you often feel depressed.  Tell your health care provider if you have ever been abused or do not feel safe at home.   This information is not intended to replace advice given to you by your health care provider. Make sure you discuss any questions you have with your health care provider.   Document Released: 05/19/2011 Document Revised: 11/24/2014 Document Reviewed: 10/05/2013 Elsevier Interactive Patient Education [employer].

## 2016-01-11 LAB — CMP14+EGFR
A/G RATIO: 1.8 (ref 1.1–2.5)
ALBUMIN: 4.2 g/dL (ref 3.5–5.5)
ALK PHOS: 107 IU/L (ref 39–117)
ALT: 15 IU/L (ref 0–32)
AST: 15 IU/L (ref 0–40)
BUN/Creatinine Ratio: 9 (ref 8–20)
BUN: 7 mg/dL (ref 6–20)
Bilirubin Total: 0.2 mg/dL (ref 0.0–1.2)
CO2: 21 mmol/L (ref 18–29)
CREATININE: 0.81 mg/dL (ref 0.57–1.00)
Calcium: 9.4 mg/dL (ref 8.7–10.2)
Chloride: 107 mmol/L — ABNORMAL HIGH (ref 96–106)
GFR calc Af Amer: 112 mL/min/{1.73_m2} (ref >59)
GFR, EST NON AFRICAN AMERICAN: 97 mL/min/{1.73_m2} (ref >59)
GLOBULIN, TOTAL: 2.4 g/dL (ref 1.5–4.5)
Glucose: 99 mg/dL (ref 65–99)
POTASSIUM: 3.6 mmol/L (ref 3.5–5.2)
SODIUM: 144 mmol/L (ref 134–144)
TOTAL PROTEIN: 6.6 g/dL (ref 6.0–8.5)

## 2016-01-11 LAB — LIPID PANEL
CHOL/HDL RATIO: 4 ratio (ref 0.0–4.4)
Cholesterol, Total: 195 mg/dL (ref 100–199)
HDL: 49 mg/dL (ref >39)
LDL CALC: 86 mg/dL (ref 0–99)
TRIGLYCERIDES: 301 mg/dL — AB (ref 0–149)
VLDL Cholesterol Cal: 60 mg/dL — ABNORMAL HIGH (ref 5–40)

## 2016-01-11 LAB — VITAMIN D 25 HYDROXY (VIT D DEFICIENCY, FRACTURES): Vit D, 25-Hydroxy: 21.9 ng/mL — ABNORMAL LOW (ref 30.0–100.0)

## 2016-01-18 ENCOUNTER — Other Ambulatory Visit: Payer: Self-pay | Admitting: Registered Nurse

## 2016-01-18 ENCOUNTER — Other Ambulatory Visit: Payer: Self-pay | Admitting: Nurse Practitioner

## 2016-02-15 ENCOUNTER — Encounter: Payer: BLUE CROSS/BLUE SHIELD | Attending: Physical Medicine & Rehabilitation | Admitting: Registered Nurse

## 2016-02-15 ENCOUNTER — Encounter: Payer: Self-pay | Admitting: Registered Nurse

## 2016-02-15 ENCOUNTER — Other Ambulatory Visit: Payer: Self-pay | Admitting: Registered Nurse

## 2016-02-15 VITALS — BP 134/81 | HR 95 | Resp 14

## 2016-02-15 DIAGNOSIS — R569 Unspecified convulsions: Secondary | ICD-10-CM | POA: Diagnosis present

## 2016-02-15 DIAGNOSIS — M797 Fibromyalgia: Secondary | ICD-10-CM | POA: Insufficient documentation

## 2016-02-15 DIAGNOSIS — Z79899 Other long term (current) drug therapy: Secondary | ICD-10-CM

## 2016-02-15 DIAGNOSIS — M545 Low back pain: Secondary | ICD-10-CM | POA: Diagnosis present

## 2016-02-15 DIAGNOSIS — M6249 Contracture of muscle, multiple sites: Secondary | ICD-10-CM | POA: Diagnosis present

## 2016-02-15 DIAGNOSIS — M62838 Other muscle spasm: Secondary | ICD-10-CM

## 2016-02-15 DIAGNOSIS — M329 Systemic lupus erythematosus, unspecified: Secondary | ICD-10-CM | POA: Insufficient documentation

## 2016-02-15 DIAGNOSIS — G894 Chronic pain syndrome: Secondary | ICD-10-CM | POA: Diagnosis not present

## 2016-02-15 DIAGNOSIS — R0781 Pleurodynia: Secondary | ICD-10-CM

## 2016-02-15 DIAGNOSIS — Z5181 Encounter for therapeutic drug level monitoring: Secondary | ICD-10-CM

## 2016-02-15 MED ORDER — OXYCODONE-ACETAMINOPHEN 7.5-325 MG PO TABS: 1.0000 | ORAL_TABLET | Freq: Every day | ORAL | Status: DC | PRN

## 2016-02-15 NOTE — Progress Notes (Signed)
Subjective:    Patient ID: Autumn Davis, female    DOB: December 27, 1985, 30 y.o.   MRN: [id number]  HPI: Autumn Davis is a 30 year old female who returns for follow up appointment for chronic pain and medication refill. She states her pain is located mainly in her bilateral sides along  her ribs.  She rates her pain 8. Her current exercise regime is performing stretching exercises and walking. In the past Autumn Davis has used a TENS unit with some relief were still awaiting insurance approval for the [facility] treatment. Also stated her mother has been diagnosed with Cancer. Grandmother in the room all questions answered.  Pain Inventory Average Pain 6 Pain Right Now 8 My pain is intermittent, constant, sharp, burning, dull, stabbing and tingling  In the last 24 hours, has pain interfered with the following? General activity 7 Relation with others 7 Enjoyment of life 7 What TIME of day is your pain at its worst? evening, night  Sleep (in general) Poor  Pain is worse with: walking, bending, sitting, standing and some activites Pain improves with: rest, heat/ice, therapy/exercise, pacing activities, medication and TENS Relief from Meds: 6  Mobility do you drive?  no  Function disabled: date disabled . I need assistance with the following:  toileting, meal prep, household duties and shopping  Neuro/Psych bladder control problems weakness numbness tremor tingling trouble walking spasms dizziness  Prior Studies Any changes since last visit?  no  Physicians involved in your care Any changes since last visit?  no   Family History  Problem Relation Age of Onset  . Thyroid disease Mother   . Lung cancer Maternal Grandmother   . Cancer Paternal Grandmother     colon/pancreatiec/lymphoma  . Irritable bowel syndrome Brother   . Colon polyps Father    Social History   Social History  . Marital Status: Married    Spouse Name: N/A  . Number of Children: N/A  .  Years of Education: N/A   Occupational History  . disabled    Social History Main Topics  . Smoking status: Never Smoker   . Smokeless tobacco: Never Used  . Alcohol Use: No  . Drug Use: No  . Sexual Activity: Not Asked   Other Topics Concern  . None   Social History Narrative   Past Surgical History  Procedure Laterality Date  . Knee surgery  2002/2003    bil  . Rectal surgery  correction of prolapse    2010  . Colonoscopy    . Esophageal manometry N/A 10/29/2015    Procedure: ESOPHAGEAL MANOMETRY (EM);  Surgeon: Iva Boop, MD;  Location: [facility];  Service: Endoscopy;  Laterality: N/A;  . 24 hour ph study N/A 10/29/2015    Procedure: 24 HOUR PH STUDY;  Surgeon: Iva Boop, MD;  Location: [facility];  Service: Endoscopy;  Laterality: N/A;   Past Medical History  Diagnosis Date  . GERD (gastroesophageal reflux disease)   . Hyperlipidemia   . Seizures (HCC)   . Thyroid disease   . Bronchitis, chronic/intermittent 01/22/2012  . Depression 01/22/2012  . IBS (irritable bowel syndrome) 01/22/2012  . Neuromuscular disorder (HCC)   . SOB (shortness of breath)   . Lupus (HCC)   . Allergy   . Arthritis     HANDS,HIPS,KNEES   BP 134/81 mmHg  Pulse 95  Resp 14  SpO2 98%  Opioid Risk Score:   Fall Risk Score:  `1  Depression screen  PHQ 2/9  Depression screen [facility] 2/9 01/10/2016 06/12/2015 03/29/2015 02/16/2015 03/13/2014  Decreased Interest 1 1 1 1 1   Down, Depressed, Hopeless 0 0 0 0 1  PHQ - 2 Score 1 1 1 1 2   Altered sleeping - - - 3 0  Tired, decreased energy - - - 1 0  Change in appetite - - - 1 0  Feeling bad or failure about yourself  - - - 0 0  Trouble concentrating - - - 1 0  Moving slowly or fidgety/restless - - - 0 0  Suicidal thoughts - - - 0 0  PHQ-9 Score - - - 7 2    2  Review of Systems  All other systems reviewed and are negative.      Objective:   Physical Exam  Constitutional: She is oriented to person, place, and time. She appears  well-developed and well-nourished.  HENT:  Head: Normocephalic and atraumatic.  Neck: Normal range of motion. Neck supple.  Cardiovascular: Normal rate and regular rhythm.   Pulmonary/Chest: Effort normal and breath sounds normal.  Musculoskeletal:  Normal Muscle Bulk and Muscle Testing Reveals: Upper Extremities: Full ROM and Muscle Strength 5/5 Thoracic Paraspinal Tenderness: T-7- T-9 Lower Extremities: Full ROM and Muscle Strength 5/5 Arises from chair slowly using straight cane for support.  Neurological: She is alert and oriented to person, place, and time.  Skin: Skin is warm and dry.  Psychiatric: She has a normal mood and affect.  Nursing note and vitals reviewed.         Assessment & Plan:  1. Chronic seizure disorder: No seizure's. Continue Gabapentin. Neurology Following  2. Chronic muscle spasms, weakness with associated pain disorder: Continue Baclofen and MOBIC. Continue with Exercise regime. 3. Chronic dysphagia: GI Following  4. Anxiety with depression : Stable. Continue Elavil  5. Fibromyalgia/ Chronic Pain: Continue with exercise and heat Therapy.  Refilled:  oxyCODONE 7.5/325mg  one tablet 5 times daily as needed #150.  6. Peripheral Neuropathy: Continue Gabapentin  20 minutes of face to face patient care time was spent during this visit. All questions were encouraged and answered

## 2016-02-17 ENCOUNTER — Other Ambulatory Visit: Payer: Self-pay

## 2016-02-17 MED ORDER — BACLOFEN 20 MG PO TABS: 20.0000 mg | ORAL_TABLET | Freq: Four times a day (QID) | ORAL | Status: DC

## 2016-02-19 ENCOUNTER — Ambulatory Visit: Payer: BLUE CROSS/BLUE SHIELD | Admitting: Registered Nurse

## 2016-02-21 ENCOUNTER — Telehealth: Payer: Self-pay | Admitting: Registered Nurse

## 2016-02-21 NOTE — Telephone Encounter (Signed)
Placed a call to Mrs. Ulice BrilliantDrake, left message to return the call.

## 2016-02-26 ENCOUNTER — Other Ambulatory Visit: Payer: Self-pay | Admitting: Registered Nurse

## 2016-02-26 ENCOUNTER — Other Ambulatory Visit: Payer: Self-pay | Admitting: Nurse Practitioner

## 2016-02-26 NOTE — Telephone Encounter (Signed)
Prednisone reordered

## 2016-03-17 ENCOUNTER — Encounter: Payer: BLUE CROSS/BLUE SHIELD | Attending: Physical Medicine & Rehabilitation | Admitting: Registered Nurse

## 2016-03-17 ENCOUNTER — Encounter: Payer: Self-pay | Admitting: Registered Nurse

## 2016-03-17 VITALS — BP 144/77 | HR 100 | Resp 14

## 2016-03-17 DIAGNOSIS — M329 Systemic lupus erythematosus, unspecified: Secondary | ICD-10-CM | POA: Diagnosis not present

## 2016-03-17 DIAGNOSIS — M6249 Contracture of muscle, multiple sites: Secondary | ICD-10-CM | POA: Insufficient documentation

## 2016-03-17 DIAGNOSIS — R0781 Pleurodynia: Secondary | ICD-10-CM

## 2016-03-17 DIAGNOSIS — Z5181 Encounter for therapeutic drug level monitoring: Secondary | ICD-10-CM

## 2016-03-17 DIAGNOSIS — M797 Fibromyalgia: Secondary | ICD-10-CM | POA: Diagnosis not present

## 2016-03-17 DIAGNOSIS — G894 Chronic pain syndrome: Secondary | ICD-10-CM | POA: Diagnosis not present

## 2016-03-17 DIAGNOSIS — M545 Low back pain: Secondary | ICD-10-CM | POA: Diagnosis not present

## 2016-03-17 DIAGNOSIS — R569 Unspecified convulsions: Secondary | ICD-10-CM | POA: Diagnosis not present

## 2016-03-17 DIAGNOSIS — Z79899 Other long term (current) drug therapy: Secondary | ICD-10-CM

## 2016-03-17 DIAGNOSIS — M62838 Other muscle spasm: Secondary | ICD-10-CM

## 2016-03-17 MED ORDER — OXYCODONE-ACETAMINOPHEN 7.5-325 MG PO TABS: 1.0000 | ORAL_TABLET | Freq: Every day | ORAL | Status: DC | PRN

## 2016-03-17 NOTE — Progress Notes (Signed)
Subjective:    Patient ID: Autumn Davis, female    DOB: [birth year], 30 y.o.   MRN: [id number]  HPI: Autumn Davis is a 30 year old female who returns for follow up appointment for chronic pain and medication refill. She states her pain is located mainly in her bilateral sides along her ribs and lower extremities. She rates her pain 5. Her current exercise regime is performing stretching exercises and walking.  Pain Inventory Average Pain 7 Pain Right Now 5 My pain is sharp, burning, stabbing, tingling and aching  In the last 24 hours, has pain interfered with the following? General activity 6 Relation with others 6 Enjoyment of life 6 What TIME of day is your pain at its worst? evening, night  Sleep (in general) Poor  Pain is worse with: walking, bending, sitting and standing Pain improves with: rest, heat/ice, therapy/exercise, medication and TENS Relief from Meds: 5  Mobility walk without assistance walk with assistance use a cane use a walker do you drive?  no  Function I need assistance with the following:  meal prep, household duties and shopping  Neuro/Psych No problems in this area  Prior Studies Any changes since last visit?  no  Physicians involved in your care Any changes since last visit?  no   Family History  Problem Relation Age of Onset  . Thyroid disease Mother   . Lung cancer Maternal Grandmother   . Cancer Paternal Grandmother     colon/pancreatiec/lymphoma  . Irritable bowel syndrome Brother   . Colon polyps Father    Social History   Social History  . Marital Status: Married    Spouse Name: N/A  . Number of Children: N/A  . Years of Education: N/A   Occupational History  . disabled    Social History Main Topics  . Smoking status: Never Smoker   . Smokeless tobacco: Never Used  . Alcohol Use: No  . Drug Use: No  . Sexual Activity: Not Asked   Other Topics Concern  . None   Social History Narrative   Past  Surgical History  Procedure Laterality Date  . Knee surgery  2002/2003    bil  . Rectal surgery  correction of prolapse    2010  . Colonoscopy    . Esophageal manometry N/A 10/29/2015    Procedure: ESOPHAGEAL MANOMETRY (EM);  Surgeon: Iva Boop, MD;  Location: [facility];  Service: Endoscopy;  Laterality: N/A;  . 24 hour ph study N/A 10/29/2015    Procedure: 24 HOUR PH STUDY;  Surgeon: Iva Boop, MD;  Location: [facility];  Service: Endoscopy;  Laterality: N/A;   Past Medical History  Diagnosis Date  . GERD (gastroesophageal reflux disease)   . Hyperlipidemia   . Seizures (HCC)   . Thyroid disease   . Bronchitis, chronic/intermittent 01/22/2012  . Depression 01/22/2012  . IBS (irritable bowel syndrome) 01/22/2012  . Neuromuscular disorder (HCC)   . SOB (shortness of breath)   . Lupus (HCC)   . Allergy   . Arthritis     HANDS,HIPS,KNEES   BP 144/77 mmHg  Pulse 100  Resp 14  SpO2 96%  Opioid Risk Score:   Fall Risk Score:  `1  Depression screen PHQ 2/9  Depression screen [facility] 2/9 01/10/2016 06/12/2015 03/29/2015 02/16/2015 03/13/2014  Decreased Interest 1 1 1 1 1   Down, Depressed, Hopeless 0 0 0 0 1  PHQ - 2 Score 1 1 1 1 2   Altered sleeping - - -  3 0  Tired, decreased energy - - - 1 0  Change in appetite - - - 1 0  Feeling bad or failure about yourself  - - - 0 0  Trouble concentrating - - - 1 0  Moving slowly or fidgety/restless - - - 0 0  Suicidal thoughts - - - 0 0  PHQ-9 Score - - - 7 2     Review of Systems  All other systems reviewed and are negative.      Objective:   Physical Exam  Constitutional: She is oriented to person, place, and time. She appears well-developed and well-nourished.  HENT:  Head: Normocephalic and atraumatic.  Neck: Normal range of motion. Neck supple.  Cardiovascular: Normal rate and regular rhythm.   Pulmonary/Chest: Effort normal and breath sounds normal.  Musculoskeletal:  Normal Muscle Bulk and Muscle Testing  Reveals: Upper Extremities: Full ROM and Muscle Strength 5/5 No Thoracic Tenderness Noted Lower Extremities: Full ROM and Muscle Strength 5/5 Arises from chair with ease Narrow Based Gait  Neurological: She is alert and oriented to person, place, and time.  Skin: Skin is warm and dry.  Psychiatric: She has a normal mood and affect.  Nursing note and vitals reviewed.         Assessment & Plan:  1. Chronic seizure disorder: No seizure's. Continue Gabapentin. Neurology Following  2. Chronic muscle spasms, weakness with associated pain disorder: Continue Baclofen and MOBIC. Continue with Exercise regime. 3. Chronic dysphagia: GI Following  4. Anxiety with depression : Stable. Continue Elavil  5. Fibromyalgia/ Chronic Pain: Continue with exercise and heat Therapy.  Refilled: oxyCODONE 7.5/325mg  one tablet 5 times daily as needed #150.  We will continue opioid monitoring program, this consists of regular clinic visits, examinations, urine drug screen, pill counts as well as use of [place] Controlled Substance Reporting System. 6. Peripheral Neuropathy: Continue Gabapentin  20 minutes of face to face patient care time was spent during this visit. All questions were encouraged and answered

## 2016-03-18 ENCOUNTER — Other Ambulatory Visit: Payer: Self-pay | Admitting: Nurse Practitioner

## 2016-03-19 ENCOUNTER — Other Ambulatory Visit: Payer: Self-pay

## 2016-03-19 MED ORDER — BACLOFEN 20 MG PO TABS: 20.0000 mg | ORAL_TABLET | Freq: Four times a day (QID) | ORAL | Status: DC

## 2016-04-08 ENCOUNTER — Ambulatory Visit (INDEPENDENT_AMBULATORY_CARE_PROVIDER_SITE_OTHER): Payer: BLUE CROSS/BLUE SHIELD | Admitting: Nurse Practitioner

## 2016-04-08 ENCOUNTER — Encounter: Payer: Self-pay | Admitting: Nurse Practitioner

## 2016-04-08 VITALS — BP 123/81 | HR 96 | Temp 97.5°F | Ht 68.0 in | Wt 226.0 lb

## 2016-04-08 DIAGNOSIS — Z3042 Encounter for surveillance of injectable contraceptive: Secondary | ICD-10-CM | POA: Diagnosis not present

## 2016-04-08 DIAGNOSIS — F329 Major depressive disorder, single episode, unspecified: Secondary | ICD-10-CM | POA: Diagnosis not present

## 2016-04-08 DIAGNOSIS — R569 Unspecified convulsions: Secondary | ICD-10-CM | POA: Diagnosis not present

## 2016-04-08 DIAGNOSIS — F32A Depression, unspecified: Secondary | ICD-10-CM

## 2016-04-08 DIAGNOSIS — K589 Irritable bowel syndrome without diarrhea: Secondary | ICD-10-CM

## 2016-04-08 DIAGNOSIS — I479 Paroxysmal tachycardia, unspecified: Secondary | ICD-10-CM

## 2016-04-08 DIAGNOSIS — K219 Gastro-esophageal reflux disease without esophagitis: Secondary | ICD-10-CM | POA: Diagnosis not present

## 2016-04-08 DIAGNOSIS — E785 Hyperlipidemia, unspecified: Secondary | ICD-10-CM | POA: Diagnosis not present

## 2016-04-08 DIAGNOSIS — J42 Unspecified chronic bronchitis: Secondary | ICD-10-CM | POA: Diagnosis not present

## 2016-04-08 DIAGNOSIS — E079 Disorder of thyroid, unspecified: Secondary | ICD-10-CM | POA: Diagnosis not present

## 2016-04-08 DIAGNOSIS — L600 Ingrowing nail: Secondary | ICD-10-CM

## 2016-04-08 DIAGNOSIS — D8989 Other specified disorders involving the immune mechanism, not elsewhere classified: Secondary | ICD-10-CM

## 2016-04-08 DIAGNOSIS — M329 Systemic lupus erythematosus, unspecified: Secondary | ICD-10-CM

## 2016-04-08 DIAGNOSIS — R000 Tachycardia, unspecified: Secondary | ICD-10-CM | POA: Diagnosis not present

## 2016-04-08 DIAGNOSIS — R609 Edema, unspecified: Secondary | ICD-10-CM

## 2016-04-08 DIAGNOSIS — L930 Discoid lupus erythematosus: Secondary | ICD-10-CM | POA: Diagnosis not present

## 2016-04-08 DIAGNOSIS — R600 Localized edema: Secondary | ICD-10-CM

## 2016-04-08 MED ORDER — CEPHALEXIN 500 MG PO CAPS: 500.0000 mg | ORAL_CAPSULE | Freq: Three times a day (TID) | ORAL | Status: DC

## 2016-04-08 MED ORDER — ATORVASTATIN CALCIUM 40 MG PO TABS: ORAL_TABLET | ORAL | Status: DC

## 2016-04-08 MED ORDER — AMITRIPTYLINE HCL 75 MG PO TABS: 75.0000 mg | ORAL_TABLET | Freq: Every day | ORAL | Status: DC

## 2016-04-08 MED ORDER — PREDNISONE 5 MG PO TABS: 5.0000 mg | ORAL_TABLET | Freq: Every morning | ORAL | Status: DC

## 2016-04-08 MED ORDER — FUROSEMIDE 20 MG PO TABS: ORAL_TABLET | ORAL | Status: DC

## 2016-04-08 MED ORDER — METOPROLOL SUCCINATE ER 25 MG PO TB24: ORAL_TABLET | ORAL | Status: DC

## 2016-04-08 MED ORDER — DEXLANSOPRAZOLE 60 MG PO CPDR: 60.0000 mg | DELAYED_RELEASE_CAPSULE | Freq: Every day | ORAL | Status: DC

## 2016-04-08 MED ORDER — MEDROXYPROGESTERONE ACETATE 150 MG/ML IM SUSP
150.0000 mg | Freq: Once | INTRAMUSCULAR | Status: AC
  Administered 2016-04-08: 150 mg via INTRAMUSCULAR

## 2016-04-08 MED ORDER — BACLOFEN 20 MG PO TABS: 20.0000 mg | ORAL_TABLET | Freq: Four times a day (QID) | ORAL | Status: DC

## 2016-04-08 MED ORDER — ACETAZOLAMIDE 250 MG PO TABS: 250.0000 mg | ORAL_TABLET | Freq: Three times a day (TID) | ORAL | Status: DC

## 2016-04-08 MED ORDER — GABAPENTIN 600 MG PO TABS: 600.0000 mg | ORAL_TABLET | Freq: Four times a day (QID) | ORAL | Status: DC

## 2016-04-08 NOTE — Patient Instructions (Signed)
Ingrown Toenail  An ingrown toenail occurs when the corner or sides of your toenail grow into the surrounding skin. The big toe is most commonly affected, but it can happen to any of your toes. If your ingrown toenail is not treated, you will be at risk for infection.  CAUSES  This condition may be caused by:  · Wearing shoes that are too small or tight.  · Injury or trauma, such as stubbing your toe or having your toe stepped on.  · Improper cutting or care of your toenails.  · Being born with (congenital) nail or foot abnormalities, such as having a nail that is too big for your toe.  RISK FACTORS  Risk factors for an ingrown toenail include:  · Age. Your nails tend to thicken as you get older, so ingrown nails are more common in older people.  · Diabetes.  · Cutting your toenails incorrectly.  · Blood circulation problems.  SYMPTOMS  Symptoms may include:  · Pain, soreness, or tenderness.  · Redness.  · Swelling.  · Hardening of the skin surrounding the toe.  Your ingrown toenail may be infected if there is fluid, pus, or drainage.  DIAGNOSIS   An ingrown toenail may be diagnosed by medical history and physical exam. If your toenail is infected, your health care provider may test a sample of the drainage.  TREATMENT  Treatment depends on the severity of your ingrown toenail. Some ingrown toenails may be treated at home. More severe or infected ingrown toenails may require surgery to remove all or part of the nail. Infected ingrown toenails may also be treated with antibiotic medicines.  HOME CARE INSTRUCTIONS  · If you were prescribed an antibiotic medicine, finish all of it even if you start to feel better.  · Soak your foot in warm soapy water for 20 minutes, 3 times per day or as directed by your health care provider.  · Carefully lift the edge of the nail away from the sore skin by wedging a small piece of cotton under the corner of the nail. This may help with the pain.  Be careful not to cause more injury  to the area.  · Wear shoes that fit well. If your ingrown toenail is causing you pain, try wearing sandals, if possible.  · Trim your toenails regularly and carefully. Do not cut them in a curved shape. Cut your toenails straight across. This prevents injury to the skin at the corners of the toenail.  · Keep your feet clean and dry.  · If you are having trouble walking and are given crutches by your health care provider, use them as directed.  · Do not pick at your toenail or try to remove it yourself.  · Take medicines only as directed by your health care provider.  · Keep all follow-up visits as directed by your health care provider. This is important.  SEEK MEDICAL CARE IF:  · Your symptoms do not improve with treatment.  SEEK IMMEDIATE MEDICAL CARE IF:  · You have red streaks that start at your foot and go up your leg.  · You have a fever.  · You have increased redness, swelling, or pain.  · You have fluid, blood, or pus coming from your toenail.     This information is not intended to replace advice given to you by your health care provider. Make sure you discuss any questions you have with your health care provider.     Document Released:   10/31/2000 Document Revised: 03/20/2015 Document Reviewed: 09/27/2014  Elsevier Interactive Patient Education ©2016 [employer]

## 2016-04-08 NOTE — Progress Notes (Signed)
Subjective:    Patient ID: Autumn Davis, female    DOB: 08-12-1986, 30 y.o.   MRN: [id number]  Patient here today for follow up of chronic medical problems.  Outpatient Encounter Prescriptions as of 04/08/2016  Medication Sig  . acetaZOLAMIDE (DIAMOX) 250 MG tablet Take 250 mg by mouth 3 (three) times daily. Take 250 mg two times a day; then 500 mg at bedtime  . albuterol (PROVENTIL HFA;VENTOLIN HFA) 108 (90 BASE) MCG/ACT inhaler Inhale 2 puffs into the lungs every 6 (six) hours as needed for wheezing or shortness of breath.  Marland Kitchen amitriptyline (ELAVIL) 75 MG tablet TAKE ONE TABLET DAILY AT BEDTIME  . atorvastatin (LIPITOR) 40 MG tablet TAKE ONE (1) TABLET EACH DAY  . baclofen (LIORESAL) 20 MG tablet Take 1 tablet (20 mg total) by mouth 4 (four) times daily.  . Beclomethasone Dipropionate (QNASL CHILDRENS) 40 MCG/ACT AERS Place into the nose.  . Calcium-Magnesium-Vitamin D (CALCIUM 1200+D3 PO) Take 1 tablet by mouth daily.  . cetirizine (ZYRTEC) 10 MG tablet Take 10 mg by mouth daily.  . Cholecalciferol (VITAMIN D) 2000 UNITS tablet Take 2,000 Units by mouth daily.  . cycloSPORINE (RESTASIS) 0.05 % ophthalmic emulsion Place 1 drop into both eyes 2 (two) times daily.  Marland Kitchen docusate sodium (COLACE) 100 MG capsule Take 100 mg by mouth 2 (two) times daily. PRN  . EPINEPHrine (EPI-PEN) 0.3 mg/0.3 mL SOAJ injection Inject 0.3 mg into the muscle as needed.  . furosemide (LASIX) 20 MG tablet TAKE ONE (1) TABLET EACH DAY  . gabapentin (NEURONTIN) 600 MG tablet Take 1 tablet (600 mg total) by mouth 4 (four) times daily.  Marland Kitchen ipratropium-albuterol (DUONEB) 0.5-2.5 (3) MG/3ML SOLN Take 3 mLs by nebulization every 4 (four) hours as needed.  Marland Kitchen L-Methylfolate (DEPLIN) 15 MG TABS Take by mouth.  . lidocaine (LIDODERM) 5 % Place 3 patches onto the skin daily. Remove & Discard patch within 12 hours or as directed by MD  . medroxyPROGESTERone (DEPO-PROVERA) 150 MG/ML injection Inject 150 mg into the muscle as  directed. Every 2 months  . meloxicam (MOBIC) 15 MG tablet TAKE ONE (1) TABLET EACH DAY  . metoprolol succinate (TOPROL-XL) 25 MG 24 hr tablet TAKE ONE (1) TABLET EACH DAY  . montelukast (SINGULAIR) 10 MG tablet TAKE ONE TABLET DAILY AT BEDTIME  . Omega 3 1000 MG CAPS Take 1 capsule by mouth daily.  Marland Kitchen omeprazole (PRILOSEC) 40 MG capsule TAKE ONE (1) CAPSULE EACH DAY  . oxyCODONE-acetaminophen (PERCOCET) 7.5-325 MG tablet Take 1 tablet by mouth 5 (five) times daily as needed for severe pain. No More Than 5 a day  . predniSONE (DELTASONE) 5 MG tablet TAKE ONE TABLET EVERY MORNING  . promethazine (PHENERGAN) 12.5 MG tablet Take 1 tablet (12.5 mg total) by mouth every 8 (eight) hours as needed for nausea or vomiting.  . vitamin E (VITAMIN E) 400 UNIT capsule Take 400 Units by mouth daily.  . [EXPIRED] medroxyPROGESTERone (DEPO-PROVERA) injection 150 mg    No facility-administered encounter medications on file as of 04/08/2016.   * Patient has recently gotten custody of a infant and she is doing so muc better since she has someone else to take care of other then herself- it has given her a reasn to get up and fight her undiagnosed autoimmune disease.   * C/O bil ingrown toenails that are draining  Hyperlipidemia This is a chronic problem. The current episode started more than 1 year ago. The problem is controlled. Recent lipid tests  were reviewed and are normal. Factors aggravating her hyperlipidemia include fatty foods. Pertinent negatives include no chest pain. Current antihyperlipidemic treatment includes statins. The current treatment provides no improvement of lipids. There are no compliance problems.  Risk factors for coronary artery disease include dyslipidemia and obesity.  Thyroid Problem Presents for follow-up visit. Patient reports no fatigue or palpitations. The symptoms have been stable. The treatment provided significant relief. Her past medical history is significant for hyperlipidemia.   GERD Has been on omeprazole- ran out and took her moms dexilant and that seemed to help better. IBS Currently managed well, denies any complications at this visit Fibromyalgia States she is doing well, no recent flares of pain Depression Managing pretty well at this time, remains on Elavil with no side effects Lupus  States she has good days and bad days, but managing ok at this time. Still not sure if she has lupus. Autoimmune disorder Not real sure what is wrong with her- has dx of lupus on chart ut as not actually ben diagnosed with lupus- is seeing many specialist. seizures Is on Diamox- has not had a seizure in over a year.  Review of Systems  Constitutional: Negative for activity change, appetite change and fatigue.  HENT: Negative.   Respiratory: Negative.   Cardiovascular: Negative for chest pain and palpitations.  Gastrointestinal: Negative.   Endocrine: Negative.   Genitourinary: Negative.   Skin: Negative.   Neurological: Negative for dizziness and light-headedness.  Psychiatric/Behavioral: Negative.   All other systems reviewed and are negative.      Objective:   Physical Exam  Constitutional: She is oriented to person, place, and time. She appears well-developed and well-nourished.  HENT:  Right Ear: External ear normal.  Left Ear: External ear normal.  Nose: Nose normal.  Mouth/Throat: Oropharynx is clear and moist.  Eyes: Pupils are equal, round, and reactive to light.  Neck: Normal range of motion. Neck supple.  Cardiovascular: Normal rate, regular rhythm and normal heart sounds.   Pulmonary/Chest: Effort normal and breath sounds normal.  Abdominal: Soft. Bowel sounds are normal.  Neurological: She is alert and oriented to person, place, and time. She has normal reflexes. No cranial nerve deficit.  Skin: Skin is warm and dry.  Infected nails bl great toe- erythematous and  draining  Psychiatric: She has a normal mood and affect. Her behavior is normal.  Judgment and thought content normal.    BP 123/81 mmHg  Pulse 96  Temp(Src) 97.5 F (36.4 C) (Oral)  Ht 5\' 8"  (1.727 m)  Wt 226 lb (102.513 kg)  BMI 34.37 kg/m2      Assessment & Plan:  1. Encounter for surveillance of injectable contraceptive - medroxyPROGESTERone (DEPO-PROVERA) injection 150 mg; Inject 1 mL (150 mg total) into the muscle once.  2. Ingrown toenail Make appointmnet for follow up in 1 week to remove toenail - cephALEXin (KEFLEX) 500 MG capsule; Take 1 capsule (500 mg total) by mouth 3 (three) times daily.  Dispense: 30 capsule; Refill: 0  3. Tachycardia, paroxysmal (HCC) - metoprolol succinate (TOPROL-XL) 25 MG 24 hr tablet; TAKE ONE (1) TABLET EACH DAY  Dispense: 90 tablet; Refill: 1  4. Chronic bronchitis, unspecified chronic bronchitis type (HCC)  5. Gastroesophageal reflux disease without esophagitis Avoid spicy foods Do not eat 2 hours prior to bedtime Changed meds to dexilant - dexlansoprazole (DEXILANT) 60 MG capsule; Take 1 capsule (60 mg total) by mouth daily.  Dispense: 30 capsule; Refill: 5  6. IBS (irritable bowel syndrome) -  baclofen (LIORESAL) 20 MG tablet; Take 1 tablet (20 mg total) by mouth 4 (four) times daily.  Dispense: 120 each; Refill: 3 - amitriptyline (ELAVIL) 75 MG tablet; Take 1 tablet (75 mg total) by mouth at bedtime.  Dispense: 30 tablet; Refill: 4  7. Thyroid disease Labs panding  8. Seizures (HCC) sontinue current seizure meds - acetaZOLAMIDE (DIAMOX) 250 MG tablet; Take 1 tablet (250 mg total) by mouth 3 (three) times daily. Take 250 mg two times a day; then 500 mg at bedtime  Dispense: 90 tablet; Refill: 5  9. Depression Stress management  10. Hyperlipidemia Low fta diet - atorvastatin (LIPITOR) 40 MG tablet; TAKE ONE (1) TABLET EACH DAY  Dispense: 90 tablet; Refill: 1 - CMP14+EGFR - Thyroid Panel With TSH  11. Lupus (systemic lupus erythematosus) (HCC) Hope can see rheumatologist - gabapentin (NEURONTIN) 600 MG  tablet; Take 1 tablet (600 mg total) by mouth 4 (four) times daily.  Dispense: 120 tablet; Refill: 3 - predniSONE (DELTASONE) 5 MG tablet; Take 1 tablet (5 mg total) by mouth every morning.  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 Tachycardia  14. Peripheral edema Elevate legs when sitting - furosemide (LASIX) 20 MG tablet; TAKE ONE (1) TABLET EACH DAY  Dispense: 30 tablet; Refill: 5    Labs pending Health maintenance reviewed Diet and exercise encouraged Continue all meds Follow up  In 3 months   Mary-Margaret Daphine Deutscher, FNP

## 2016-04-09 ENCOUNTER — Telehealth: Payer: Self-pay | Admitting: Nurse Practitioner

## 2016-04-09 LAB — CMP14+EGFR
ALK PHOS: 121 IU/L — AB (ref 39–117)
ALT: 15 IU/L (ref 0–32)
AST: 17 IU/L (ref 0–40)
Albumin/Globulin Ratio: 1.8 (ref 1.2–2.2)
Albumin: 4.5 g/dL (ref 3.5–5.5)
BUN/Creatinine Ratio: 11 (ref 9–23)
BUN: 8 mg/dL (ref 6–20)
Bilirubin Total: 0.3 mg/dL (ref 0.0–1.2)
CO2: 22 mmol/L (ref 18–29)
CREATININE: 0.75 mg/dL (ref 0.57–1.00)
Calcium: 9.8 mg/dL (ref 8.7–10.2)
Chloride: 106 mmol/L (ref 96–106)
GFR calc Af Amer: 122 mL/min/{1.73_m2} (ref >59)
GFR calc non Af Amer: 106 mL/min/{1.73_m2} (ref >59)
GLOBULIN, TOTAL: 2.5 g/dL (ref 1.5–4.5)
Glucose: 99 mg/dL (ref 65–99)
POTASSIUM: 3.9 mmol/L (ref 3.5–5.2)
SODIUM: 145 mmol/L — AB (ref 134–144)
Total Protein: 7 g/dL (ref 6.0–8.5)

## 2016-04-09 LAB — THYROID PANEL WITH TSH
Free Thyroxine Index: 1.7 (ref 1.2–4.9)
T3 Uptake Ratio: 26 % (ref 24–39)
T4 TOTAL: 6.4 ug/dL (ref 4.5–12.0)
TSH: 1.27 u[IU]/mL (ref 0.450–4.500)

## 2016-04-09 NOTE — Telephone Encounter (Signed)
Just make appointment before June 11- when i am on vacation-

## 2016-04-10 NOTE — Telephone Encounter (Signed)
Pt given appt with MMM 04/22/16 at 3:00.

## 2016-04-17 ENCOUNTER — Encounter: Payer: BLUE CROSS/BLUE SHIELD | Attending: Physical Medicine & Rehabilitation | Admitting: Registered Nurse

## 2016-04-17 ENCOUNTER — Encounter: Payer: Self-pay | Admitting: Registered Nurse

## 2016-04-17 VITALS — BP 120/77 | HR 84 | Resp 14

## 2016-04-17 DIAGNOSIS — R0781 Pleurodynia: Secondary | ICD-10-CM

## 2016-04-17 DIAGNOSIS — M545 Low back pain: Secondary | ICD-10-CM | POA: Insufficient documentation

## 2016-04-17 DIAGNOSIS — M6249 Contracture of muscle, multiple sites: Secondary | ICD-10-CM | POA: Diagnosis not present

## 2016-04-17 DIAGNOSIS — M797 Fibromyalgia: Secondary | ICD-10-CM | POA: Insufficient documentation

## 2016-04-17 DIAGNOSIS — Z5181 Encounter for therapeutic drug level monitoring: Secondary | ICD-10-CM

## 2016-04-17 DIAGNOSIS — M329 Systemic lupus erythematosus, unspecified: Secondary | ICD-10-CM | POA: Insufficient documentation

## 2016-04-17 DIAGNOSIS — M62838 Other muscle spasm: Secondary | ICD-10-CM

## 2016-04-17 DIAGNOSIS — Z79899 Other long term (current) drug therapy: Secondary | ICD-10-CM

## 2016-04-17 DIAGNOSIS — R569 Unspecified convulsions: Secondary | ICD-10-CM | POA: Insufficient documentation

## 2016-04-17 DIAGNOSIS — G894 Chronic pain syndrome: Secondary | ICD-10-CM

## 2016-04-17 MED ORDER — OXYCODONE-ACETAMINOPHEN 7.5-325 MG PO TABS: 1.0000 | ORAL_TABLET | Freq: Every day | ORAL | Status: DC | PRN

## 2016-04-17 NOTE — Progress Notes (Signed)
Subjective:    Patient ID: Autumn Davis, female    DOB: 10-Feb-1986, 30 y.o.   MRN: [id number]  HPI: [name] is a 30 year old female who returns for follow up appointment for chronic pain and medication refill. She states her pain is located mainly in her bilateral sides alongher ribs,lower extremities and left knee.She rates her pain 5. Her current exercise regime is performing stretching exercises and walking.  Also states she has and appointment with Dr. Lendon Colonel at [facility] on June 20,2017.  Pain Inventory Average Pain 7 Pain Right Now 5 My pain is constant, sharp, burning, dull, stabbing and tingling  In the last 24 hours, has pain interfered with the following? General activity 5 Relation with others 5 Enjoyment of life 5 What TIME of day is your pain at its worst? evening Sleep (in general) Poor  Pain is worse with: walking, bending, sitting, standing and some activites Pain improves with: rest, heat/ice, therapy/exercise, pacing activities, medication and TENS Relief from Meds: na  Mobility Do you have any goals in this area?  no  Function I need assistance with the following:  meal prep, household duties and shopping  Neuro/Psych bladder control problems weakness numbness tremor tingling trouble walking spasms dizziness  Prior Studies na  Physicians involved in your care na   Family History  Problem Relation Age of Onset  . Thyroid disease Mother   . Lung cancer Maternal Grandmother   . Cancer Paternal Grandmother     colon/pancreatiec/lymphoma  . Irritable bowel syndrome Brother   . Colon polyps Father    Social History   Social History  . Marital Status: Married    Spouse Name: N/A  . Number of Children: N/A  . Years of Education: N/A   Occupational History  . disabled    Social History Main Topics  . Smoking status: Never Smoker   . Smokeless tobacco: Never Used  . Alcohol Use: No  . Drug Use: No  .  Sexual Activity: Not Asked   Other Topics Concern  . None   Social History Narrative   Past Surgical History  Procedure Laterality Date  . Knee surgery  2002/2003    bil  . Rectal surgery  correction of prolapse    2010  . Colonoscopy    . Esophageal manometry N/A 10/29/2015    Procedure: ESOPHAGEAL MANOMETRY (EM);  Surgeon: Iva Boop, MD;  Location: [facility];  Service: Endoscopy;  Laterality: N/A;  . 24 hour ph study N/A 10/29/2015    Procedure: 24 HOUR PH STUDY;  Surgeon: Iva Boop, MD;  Location: [facility];  Service: Endoscopy;  Laterality: N/A;   Past Medical History  Diagnosis Date  . GERD (gastroesophageal reflux disease)   . Hyperlipidemia   . Seizures (HCC)   . Thyroid disease   . Bronchitis, chronic/intermittent 01/22/2012  . Depression 01/22/2012  . IBS (irritable bowel syndrome) 01/22/2012  . Neuromuscular disorder (HCC)   . SOB (shortness of breath)   . Lupus (HCC)   . Allergy   . Arthritis     HANDS,HIPS,KNEES   BP 120/77 mmHg  Pulse 84  Resp 14  SpO2 98%  Opioid Risk Score:   Fall Risk Score:  `1  Depression screen PHQ 2/9  Depression screen [facility] 2/9 04/17/2016 04/08/2016 01/10/2016 06/12/2015 03/29/2015 02/16/2015 03/13/2014  Decreased Interest 0 1 1 1 1 1 1   Down, Depressed, Hopeless 0 0 0 0 0 0 1  PHQ -  2 Score 0 1 1 1 1 1 2   Altered sleeping - - - - - 3 0  Tired, decreased energy - - - - - 1 0  Change in appetite - - - - - 1 0  Feeling bad or failure about yourself  - - - - - 0 0  Trouble concentrating - - - - - 1 0  Moving slowly or fidgety/restless - - - - - 0 0  Suicidal thoughts - - - - - 0 0  PHQ-9 Score - - - - - 7 2       Review of Systems  All other systems reviewed and are negative.      Objective:   Physical Exam  Constitutional: She is oriented to person, place, and time. She appears well-developed and well-nourished.  HENT:  Head: Normocephalic and atraumatic.  Neck: Normal range of motion. Neck supple.    Cardiovascular: Normal rate and regular rhythm.   Pulmonary/Chest: Effort normal and breath sounds normal.  Musculoskeletal:  Normal Muscle Bulk and Muscle Testing Reveals: Upper Extremities: Full ROM and Muscle Strength 5/5 No Thoracic Pain Lower Extremities: Full ROM and Muscle Strength 5/5 Left Lower Extremity Flexion Produces Pain into Left Patella Wearing Left knee brace Arises from chair with ease Narrow Based Gait  Neurological: She is alert and oriented to person, place, and time.  Skin: Skin is warm and dry.  Psychiatric: She has a normal mood and affect.  Nursing note and vitals reviewed.         Assessment & Plan:  1. Chronic seizure disorder: No seizure's. Continue Gabapentin. Neurology Following  2. Chronic muscle spasms, weakness with associated pain disorder: Continue Baclofen and MOBIC. Continue with Exercise regime. 3. Chronic dysphagia: GI Following  4. Anxiety with depression : Stable. Continue Elavil  5. Fibromyalgia/ Chronic Pain: Continue with exercise and heat Therapy.  Refilled: oxyCODONE 7.5/325mg  one tablet 5 times daily as needed #150.  We will continue opioid monitoring program, this consists of regular clinic visits, examinations, urine drug screen, pill counts as well as use of [place] Controlled Substance Reporting System. 6. Peripheral Neuropathy: Continue Gabapentin  20 minutes of face to face patient care time was spent during this visit. All questions were encouraged and answered

## 2016-04-22 ENCOUNTER — Ambulatory Visit (INDEPENDENT_AMBULATORY_CARE_PROVIDER_SITE_OTHER): Payer: BLUE CROSS/BLUE SHIELD | Admitting: Nurse Practitioner

## 2016-04-22 ENCOUNTER — Encounter: Payer: Self-pay | Admitting: Nurse Practitioner

## 2016-04-22 VITALS — BP 132/88 | HR 109 | Temp 97.2°F | Ht 68.0 in | Wt 223.0 lb

## 2016-04-22 DIAGNOSIS — L600 Ingrowing nail: Secondary | ICD-10-CM

## 2016-04-22 NOTE — Patient Instructions (Signed)
Ingrown Toenail  An ingrown toenail occurs when the corner or sides of your toenail grow into the surrounding skin. The big toe is most commonly affected, but it can happen to any of your toes. If your ingrown toenail is not treated, you will be at risk for infection.  CAUSES  This condition may be caused by:  · Wearing shoes that are too small or tight.  · Injury or trauma, such as stubbing your toe or having your toe stepped on.  · Improper cutting or care of your toenails.  · Being born with (congenital) nail or foot abnormalities, such as having a nail that is too big for your toe.  RISK FACTORS  Risk factors for an ingrown toenail include:  · Age. Your nails tend to thicken as you get older, so ingrown nails are more common in older people.  · Diabetes.  · Cutting your toenails incorrectly.  · Blood circulation problems.  SYMPTOMS  Symptoms may include:  · Pain, soreness, or tenderness.  · Redness.  · Swelling.  · Hardening of the skin surrounding the toe.  Your ingrown toenail may be infected if there is fluid, pus, or drainage.  DIAGNOSIS   An ingrown toenail may be diagnosed by medical history and physical exam. If your toenail is infected, your health care provider may test a sample of the drainage.  TREATMENT  Treatment depends on the severity of your ingrown toenail. Some ingrown toenails may be treated at home. More severe or infected ingrown toenails may require surgery to remove all or part of the nail. Infected ingrown toenails may also be treated with antibiotic medicines.  HOME CARE INSTRUCTIONS  · If you were prescribed an antibiotic medicine, finish all of it even if you start to feel better.  · Soak your foot in warm soapy water for 20 minutes, 3 times per day or as directed by your health care provider.  · Carefully lift the edge of the nail away from the sore skin by wedging a small piece of cotton under the corner of the nail. This may help with the pain.  Be careful not to cause more injury  to the area.  · Wear shoes that fit well. If your ingrown toenail is causing you pain, try wearing sandals, if possible.  · Trim your toenails regularly and carefully. Do not cut them in a curved shape. Cut your toenails straight across. This prevents injury to the skin at the corners of the toenail.  · Keep your feet clean and dry.  · If you are having trouble walking and are given crutches by your health care provider, use them as directed.  · Do not pick at your toenail or try to remove it yourself.  · Take medicines only as directed by your health care provider.  · Keep all follow-up visits as directed by your health care provider. This is important.  SEEK MEDICAL CARE IF:  · Your symptoms do not improve with treatment.  SEEK IMMEDIATE MEDICAL CARE IF:  · You have red streaks that start at your foot and go up your leg.  · You have a fever.  · You have increased redness, swelling, or pain.  · You have fluid, blood, or pus coming from your toenail.     This information is not intended to replace advice given to you by your health care provider. Make sure you discuss any questions you have with your health care provider.     Document Released:   10/31/2000 Document Revised: 03/20/2015 Document Reviewed: 09/27/2014  Elsevier Interactive Patient Education ©2016 [employer]

## 2016-04-22 NOTE — Progress Notes (Signed)
Subjective:    Patient ID: Autumn Davis, female    DOB: Jul 17, 1986, 30 y.o.   MRN: [id number]  HPI  Patient was sen for follow up  On 04/17/16- at that time she had an bil ingrown toenail that were infected- she was given cipro and was told to do epsom salts BID- SHe is here toady for toenail removal bil.   Review of Systems  Constitutional: Negative.   Respiratory: Negative.   Cardiovascular: Negative.   Neurological: Negative.   Psychiatric/Behavioral: Negative.   All other systems reviewed and are negative.      Objective:   Physical Exam  Constitutional: She is oriented to person, place, and time. She appears well-developed and well-nourished. No distress.  Cardiovascular: Normal rate, regular rhythm and normal heart sounds.   Pulmonary/Chest: Effort normal and breath sounds normal.  Neurological: She is alert and oriented to person, place, and time.  Skin: Skin is warm.  Erythematous edematous lateral nail border of left great toe.    Procedure:  Lidocaine !% plain mixed 1:1 with marcaine 0.25% plain- 6ml local bil  Cleaned with betadine  Lateral nail borders removed with scossors and freer elevator  Nacl  dressing applied  Patient tolerated well      Assessment & Plan:  1. Ingrown toenail Wound care discussed RTO prn  Mary-Margaret Daphine Deutscher, FNP

## 2016-05-02 ENCOUNTER — Other Ambulatory Visit: Payer: Self-pay | Admitting: Nurse Practitioner

## 2016-05-06 DIAGNOSIS — M3500 Sicca syndrome, unspecified: Secondary | ICD-10-CM | POA: Diagnosis not present

## 2016-05-06 DIAGNOSIS — G894 Chronic pain syndrome: Secondary | ICD-10-CM | POA: Diagnosis not present

## 2016-05-06 DIAGNOSIS — Z7952 Long term (current) use of systemic steroids: Secondary | ICD-10-CM | POA: Diagnosis not present

## 2016-05-06 DIAGNOSIS — R768 Other specified abnormal immunological findings in serum: Secondary | ICD-10-CM | POA: Diagnosis not present

## 2016-05-06 DIAGNOSIS — M2550 Pain in unspecified joint: Secondary | ICD-10-CM | POA: Diagnosis not present

## 2016-05-06 DIAGNOSIS — R21 Rash and other nonspecific skin eruption: Secondary | ICD-10-CM | POA: Diagnosis not present

## 2016-05-06 DIAGNOSIS — M791 Myalgia: Secondary | ICD-10-CM | POA: Diagnosis not present

## 2016-05-06 DIAGNOSIS — R5382 Chronic fatigue, unspecified: Secondary | ICD-10-CM | POA: Diagnosis not present

## 2016-05-16 ENCOUNTER — Encounter: Payer: Self-pay | Admitting: Registered Nurse

## 2016-05-16 ENCOUNTER — Encounter (HOSPITAL_BASED_OUTPATIENT_CLINIC_OR_DEPARTMENT_OTHER): Payer: BLUE CROSS/BLUE SHIELD | Admitting: Registered Nurse

## 2016-05-16 VITALS — BP 116/80 | HR 87 | Resp 17

## 2016-05-16 DIAGNOSIS — D8989 Other specified disorders involving the immune mechanism, not elsewhere classified: Secondary | ICD-10-CM

## 2016-05-16 DIAGNOSIS — M62838 Other muscle spasm: Secondary | ICD-10-CM

## 2016-05-16 DIAGNOSIS — M797 Fibromyalgia: Secondary | ICD-10-CM | POA: Diagnosis not present

## 2016-05-16 DIAGNOSIS — G894 Chronic pain syndrome: Secondary | ICD-10-CM

## 2016-05-16 DIAGNOSIS — R0781 Pleurodynia: Secondary | ICD-10-CM | POA: Diagnosis not present

## 2016-05-16 DIAGNOSIS — M6249 Contracture of muscle, multiple sites: Secondary | ICD-10-CM | POA: Diagnosis not present

## 2016-05-16 DIAGNOSIS — M329 Systemic lupus erythematosus, unspecified: Secondary | ICD-10-CM

## 2016-05-16 DIAGNOSIS — M545 Low back pain: Secondary | ICD-10-CM | POA: Diagnosis not present

## 2016-05-16 DIAGNOSIS — L930 Discoid lupus erythematosus: Secondary | ICD-10-CM

## 2016-05-16 DIAGNOSIS — R569 Unspecified convulsions: Secondary | ICD-10-CM | POA: Diagnosis not present

## 2016-05-16 DIAGNOSIS — Z79899 Other long term (current) drug therapy: Secondary | ICD-10-CM | POA: Diagnosis not present

## 2016-05-16 DIAGNOSIS — Z5181 Encounter for therapeutic drug level monitoring: Secondary | ICD-10-CM | POA: Diagnosis not present

## 2016-05-16 MED ORDER — LIDOCAINE 5 % EX PTCH: 3.0000 | MEDICATED_PATCH | CUTANEOUS | Status: DC

## 2016-05-16 MED ORDER — OXYCODONE-ACETAMINOPHEN 7.5-325 MG PO TABS: 1.0000 | ORAL_TABLET | Freq: Every day | ORAL | Status: DC | PRN

## 2016-05-16 MED ORDER — MELOXICAM 15 MG PO TABS: ORAL_TABLET | ORAL | Status: DC

## 2016-05-16 NOTE — Progress Notes (Signed)
Subjective:    Patient ID: Autumn Davis, female    DOB: Feb 03, 1986, 30 y.o.   MRN: [id number]  HPI: [name] is a 30 year old female who returns for follow up appointment for chronic pain and medication refill. She states her pain is located mainly in her bilateral sides alongher ribs.She rates her pain 7.  Her current exercise regime is performing stretching exercises and walking.  Baird Lyons seen Dr. Lendon Colonel at [facility] on June 20,2017.  Pain Inventory Average Pain 6 Pain Right Now 7 My pain is constant, sharp, burning, stabbing, tingling and aching  In the last 24 hours, has pain interfered with the following? General activity 9 Relation with others 7 Enjoyment of life 8 What TIME of day is your pain at its worst? evening, night Sleep (in general) Poor  Pain is worse with: walking, bending, sitting, inactivity, standing and some activites Pain improves with: rest, heat/ice, therapy/exercise, pacing activities, medication and TENS Relief from Meds: 3  Mobility walk without assistance walk with assistance use a cane use a walker ability to climb steps?  yes do you drive?  no use a wheelchair transfers alone  Function I need assistance with the following:  meal prep, household duties and shopping  Neuro/Psych bladder control problems weakness numbness tremor tingling trouble walking spasms dizziness  Prior Studies Any changes since last visit?  no  Physicians involved in your care Any changes since last visit?  yes Rheumatologist .   Family History  Problem Relation Age of Onset  . Thyroid disease Mother   . Lung cancer Maternal Grandmother   . Cancer Paternal Grandmother     colon/pancreatiec/lymphoma  . Irritable bowel syndrome Brother   . Colon polyps Father    Social History   Social History  . Marital Status: Married    Spouse Name: N/A  . Number of Children: N/A  . Years of Education: N/A   Occupational History   . disabled    Social History Main Topics  . Smoking status: Never Smoker   . Smokeless tobacco: Never Used  . Alcohol Use: No  . Drug Use: No  . Sexual Activity: Not Asked   Other Topics Concern  . None   Social History Narrative   Past Surgical History  Procedure Laterality Date  . Knee surgery  2002/2003    bil  . Rectal surgery  correction of prolapse    2010  . Colonoscopy    . Esophageal manometry N/A 10/29/2015    Procedure: ESOPHAGEAL MANOMETRY (EM);  Surgeon: Autumn Boop, MD;  Location: [facility];  Service: Endoscopy;  Laterality: N/A;  . 24 hour ph study N/A 10/29/2015    Procedure: 24 HOUR PH STUDY;  Surgeon: Autumn Boop, MD;  Location: [facility];  Service: Endoscopy;  Laterality: N/A;   Past Medical History  Diagnosis Date  . GERD (gastroesophageal reflux disease)   . Hyperlipidemia   . Seizures (HCC)   . Thyroid disease   . Bronchitis, chronic/intermittent 01/22/2012  . Depression 01/22/2012  . IBS (irritable bowel syndrome) 01/22/2012  . Neuromuscular disorder (HCC)   . SOB (shortness of breath)   . Lupus (HCC)   . Allergy   . Arthritis     HANDS,HIPS,KNEES   BP 116/80 mmHg  Pulse 87  Resp 17  SpO2 98%  Opioid Risk Score:   Fall Risk Score:  `1  Depression screen PHQ 2/9  Depression screen [facility] 2/9 04/17/2016 04/08/2016 01/10/2016 06/12/2015  03/29/2015 02/16/2015 03/13/2014  Decreased Interest 0 1 1 1 1 1 1   Down, Depressed, Hopeless 0 0 0 0 0 0 1  PHQ - 2 Score 0 1 1 1 1 1 2   Altered sleeping - - - - - 3 0  Tired, decreased energy - - - - - 1 0  Change in appetite - - - - - 1 0  Feeling bad or failure about yourself  - - - - - 0 0  Trouble concentrating - - - - - 1 0  Moving slowly or fidgety/restless - - - - - 0 0  Suicidal thoughts - - - - - 0 0  PHQ-9 Score - - - - - 7 2     Review of Systems  Constitutional:       Bladder control problems   Musculoskeletal: Positive for gait problem.  Neurological: Positive for dizziness, tremors,  weakness and numbness.       Tingling Spasms    All other systems reviewed and are negative.      Objective:   Physical Exam  Constitutional: She is oriented to person, place, and time. She appears well-developed and well-nourished.  HENT:  Head: Normocephalic and atraumatic.  Neck: Normal range of motion. Neck supple.  Cardiovascular: Normal rate and regular rhythm.   Pulmonary/Chest: Effort normal and breath sounds normal.  Musculoskeletal:  Normal Muscle Bulk and Muscle Testing Reveals: Upper Extremities: Full ROM and Muscle Strength 5/5 Back without spinal tenderness Lower Extremities: Full ROM and Muscle Strength 5/5 Arises from table with ease Narrow Based Gait  Neurological: She is alert and oriented to person, place, and time.  Skin: Skin is warm and dry.  Psychiatric: She has a normal mood and affect.  Nursing note and vitals reviewed.         Assessment & Plan:  1. Chronic seizure disorder: No seizure's. Continue Gabapentin. Neurology Following  2. Chronic muscle spasms, weakness with associated pain disorder: Continue Baclofen and MOBIC. Continue with Exercise regime. 3. Chronic dysphagia: GI Following  4. Anxiety with depression : Stable. Continue Elavil  5. Fibromyalgia/ Chronic Pain: Continue with exercise and heat Therapy.  Refilled: oxyCODONE 7.5/325mg  one tablet 5 times daily as needed #150.  We will continue opioid monitoring program, this consists of regular clinic visits, examinations, urine drug screen, pill counts as well as use of [place] Controlled Substance Reporting System. 6. Peripheral Neuropathy: Continue Gabapentin  20 minutes of face to face patient care time was spent during this visit. All questions were encouraged and answered

## 2016-05-25 LAB — TOXASSURE SELECT,+ANTIDEPR,UR: PDF: 0

## 2016-05-27 ENCOUNTER — Telehealth: Payer: Self-pay

## 2016-05-27 NOTE — Progress Notes (Signed)
Urine drug screen for this encounter is inconsistent for prescribed medications.   

## 2016-05-27 NOTE — Telephone Encounter (Signed)
Pt's UDS has a positive level of ethyl glucuronide.

## 2016-05-29 DIAGNOSIS — M791 Myalgia: Secondary | ICD-10-CM | POA: Diagnosis not present

## 2016-05-29 DIAGNOSIS — M3500 Sicca syndrome, unspecified: Secondary | ICD-10-CM | POA: Diagnosis not present

## 2016-05-29 DIAGNOSIS — G894 Chronic pain syndrome: Secondary | ICD-10-CM | POA: Diagnosis not present

## 2016-05-29 DIAGNOSIS — Z7952 Long term (current) use of systemic steroids: Secondary | ICD-10-CM | POA: Diagnosis not present

## 2016-05-29 DIAGNOSIS — R5382 Chronic fatigue, unspecified: Secondary | ICD-10-CM | POA: Diagnosis not present

## 2016-05-29 DIAGNOSIS — M2550 Pain in unspecified joint: Secondary | ICD-10-CM | POA: Diagnosis not present

## 2016-05-29 DIAGNOSIS — R21 Rash and other nonspecific skin eruption: Secondary | ICD-10-CM | POA: Diagnosis not present

## 2016-05-29 DIAGNOSIS — R768 Other specified abnormal immunological findings in serum: Secondary | ICD-10-CM | POA: Diagnosis not present

## 2016-06-02 NOTE — Telephone Encounter (Signed)
Please counsel her on use of etoh with her meds. She will be non-narcotic if testing positive again.

## 2016-06-03 NOTE — Telephone Encounter (Signed)
A formal warning will be sent through myChart and mailed to her address.

## 2016-06-04 ENCOUNTER — Encounter: Payer: Self-pay | Admitting: Physical Medicine & Rehabilitation

## 2016-06-04 ENCOUNTER — Telehealth: Payer: Self-pay | Admitting: Physical Medicine & Rehabilitation

## 2016-06-04 NOTE — Telephone Encounter (Signed)
Patient would like for Autumn Davis to call her about her UDS that had alcohol in it.  Her number is [phone].

## 2016-06-04 NOTE — Telephone Encounter (Signed)
Returned Ms. [name] Call,  She states she has not had ETOH in 7 years. Her UDS was reviewed, the ets was negative. The positive UDS will be retracted, she verbalizes understanding. Dr. Riley KillSwartz is aware.

## 2016-06-11 ENCOUNTER — Ambulatory Visit: Payer: BLUE CROSS/BLUE SHIELD | Admitting: Registered Nurse

## 2016-06-12 ENCOUNTER — Encounter: Payer: Self-pay | Admitting: Registered Nurse

## 2016-06-12 ENCOUNTER — Encounter: Payer: BLUE CROSS/BLUE SHIELD | Attending: Physical Medicine & Rehabilitation | Admitting: Registered Nurse

## 2016-06-12 VITALS — BP 101/65 | HR 87

## 2016-06-12 DIAGNOSIS — M797 Fibromyalgia: Secondary | ICD-10-CM | POA: Insufficient documentation

## 2016-06-12 DIAGNOSIS — M5416 Radiculopathy, lumbar region: Secondary | ICD-10-CM | POA: Diagnosis not present

## 2016-06-12 DIAGNOSIS — M6249 Contracture of muscle, multiple sites: Secondary | ICD-10-CM | POA: Insufficient documentation

## 2016-06-12 DIAGNOSIS — M545 Low back pain: Secondary | ICD-10-CM | POA: Insufficient documentation

## 2016-06-12 DIAGNOSIS — R569 Unspecified convulsions: Secondary | ICD-10-CM | POA: Insufficient documentation

## 2016-06-12 DIAGNOSIS — M329 Systemic lupus erythematosus, unspecified: Secondary | ICD-10-CM | POA: Insufficient documentation

## 2016-06-16 ENCOUNTER — Ambulatory Visit: Payer: BLUE CROSS/BLUE SHIELD | Admitting: Physical Medicine & Rehabilitation

## 2016-06-16 NOTE — Progress Notes (Signed)
Patient ID: Autumn Davis, female   DOB: Dec 16, 1985, 30 y.o.   MRN: [id number]   Chief Complaint: Lumbar Radiculopathy, Low Back Pain M54.16 M54.5 Bio-wave Treatment Medical History: Low Back pain/ Lumbar radiculopathy/ Fibromyalgia Syndrome/ Auto Immune Syndrome.  She had some relief with TENS but the relief was short term.  Procedure Performed: Procedure Performed: Percutaneous Electrical Nerve Stimulation ( PENS) #1.    Pain Score: 5  Informed Consent:Discussedalternative treatment options, possible risks, potential complications,andtreatment limitations.  Position: The patient was seated with torso at 90 degrees to the legs.  Description: The patient was taken to the exam room where the diagnostic implantation neurostimulation device through placement of needle array electrodes were percutaneously placed after identifying the point of maximal tenderness (L-4-L-5). The area was cleaned with an alcohol swab and then allowed to air dry. The needle array was removed from the packing and placed over the marked areas. Both thumbs were used to apply perpendicular pressure at greater than 10 pounds of pressure per square inch over the needle array penetrating each through the dermal layer into the subcutaneous layer. The patient was positioned seated upright and instructed on the use of the Biowave device so that the intensity could be controlled through the session. The device was activated to deliver the electrical signals through the skin into the deep tissue at the pain site. The treatment duration lasted 30 minutes,during which time someone stayed with the pt for the duration of treatment. The pt started at 28% intensity and reached 42% intensity. At the completion of the treatment the electrodes were removed and placed in the sharps disposal container. The area was cleaned with an alcohol swab. No bleeding noted. The patient tolerated the procedure well with no complications.  Pre-treatment pain level was 5/10. The patient reports the pain level post treatment was 3/10. Follow up appointment scheduled for PENS treatment is 06/25/2016.     Medication List       Accurate as of 06/12/16 11:59 PM. Always use your most recent med list.          acetaZOLAMIDE 250 MG tablet Commonly known as:  DIAMOX Take 1 tablet (250 mg total) by mouth 3 (three) times daily. Take 250 mg two times a day; then 500 mg at bedtime   albuterol 108 (90 Base) MCG/ACT inhaler Commonly known as:  PROVENTIL HFA;VENTOLIN HFA Inhale 2 puffs into the lungs every 6 (six) hours as needed for wheezing or shortness of breath.   amitriptyline 75 MG tablet Commonly known as:  ELAVIL Take 1 tablet (75 mg total) by mouth at bedtime.   atorvastatin 40 MG tablet Commonly known as:  LIPITOR TAKE ONE (1) TABLET EACH DAY   baclofen 20 MG tablet Commonly known as:  LIORESAL Take 1 tablet (20 mg total) by mouth 4 (four) times daily.   CALCIUM 1200+D3 PO Take 1 tablet by mouth daily.   cetirizine 10 MG tablet Commonly known as:  ZYRTEC Take 10 mg by mouth daily.   cycloSPORINE 0.05 % ophthalmic emulsion Commonly known as:  RESTASIS Place 1 drop into both eyes 2 (two) times daily.   DEPLIN 15 MG Tabs Take by mouth.   dexlansoprazole 60 MG capsule Commonly known as:  DEXILANT Take 1 capsule (60 mg total) by mouth daily.   docusate sodium 100 MG capsule Commonly known as:  COLACE Take 100 mg by mouth 2 (two) times daily. PRN   EPINEPHrine 0.3 mg/0.3 mL Soaj injection Commonly known as:  EPI-PEN USE AS  DIRECTED   furosemide 20 MG tablet Commonly known as:  LASIX TAKE ONE (1) TABLET EACH DAY   gabapentin 600 MG tablet Commonly known as:  NEURONTIN Take 1 tablet (600 mg total) by mouth 4 (four) times daily.   ipratropium-albuterol 0.5-2.5 (3) MG/3ML Soln Commonly known as:  DUONEB Take 3 mLs by nebulization every 4 (four) hours as needed.   lidocaine 5 % Commonly known as:   LIDODERM Place 3 patches onto the skin daily. Remove & Discard patch within 12 hours or as directed by MD   medroxyPROGESTERone 150 MG/ML injection Commonly known as:  DEPO-PROVERA Inject 150 mg into the muscle as directed. Every 2 months   meloxicam 15 MG tablet Commonly known as:  MOBIC TAKE ONE (1) TABLET EACH DAY   metoprolol succinate 25 MG 24 hr tablet Commonly known as:  TOPROL-XL TAKE ONE (1) TABLET EACH DAY   montelukast 10 MG tablet Commonly known as:  SINGULAIR TAKE ONE TABLET DAILY AT BEDTIME   Omega 3 1000 MG Caps Take 1 capsule by mouth daily.   oxyCODONE-acetaminophen 7.5-325 MG tablet Commonly known as:  PERCOCET Take 1 tablet by mouth 5 (five) times daily as needed for severe pain. No More Than 5 a day   predniSONE 5 MG tablet Commonly known as:  DELTASONE Take 1 tablet (5 mg total) by mouth every morning.   promethazine 12.5 MG tablet Commonly known as:  PHENERGAN Take 1 tablet (12.5 mg total) by mouth every 8 (eight) hours as needed for nausea or vomiting.   QNASL CHILDRENS 40 MCG/ACT Aers Generic drug:  Beclomethasone Dipropionate Place into the nose.   Vitamin D 2000 units tablet Take 2,000 Units by mouth daily.   vitamin E 400 UNIT capsule Generic drug:  vitamin E Take 400 Units by mouth daily.      Post Vitals: BP 101/65 (BP Location: Left Arm, Cuff Size: Large)   Pulse 87   SpO2 97%

## 2016-06-17 DIAGNOSIS — F329 Major depressive disorder, single episode, unspecified: Secondary | ICD-10-CM | POA: Diagnosis not present

## 2016-06-17 DIAGNOSIS — Z79899 Other long term (current) drug therapy: Secondary | ICD-10-CM | POA: Diagnosis not present

## 2016-06-17 DIAGNOSIS — N132 Hydronephrosis with renal and ureteral calculous obstruction: Secondary | ICD-10-CM | POA: Diagnosis not present

## 2016-06-17 DIAGNOSIS — K429 Umbilical hernia without obstruction or gangrene: Secondary | ICD-10-CM | POA: Diagnosis not present

## 2016-06-17 DIAGNOSIS — J45909 Unspecified asthma, uncomplicated: Secondary | ICD-10-CM | POA: Diagnosis not present

## 2016-06-18 ENCOUNTER — Encounter: Payer: BLUE CROSS/BLUE SHIELD | Admitting: Physical Medicine & Rehabilitation

## 2016-06-18 DIAGNOSIS — N3946 Mixed incontinence: Secondary | ICD-10-CM | POA: Diagnosis not present

## 2016-06-18 DIAGNOSIS — N202 Calculus of kidney with calculus of ureter: Secondary | ICD-10-CM | POA: Diagnosis not present

## 2016-06-18 DIAGNOSIS — N132 Hydronephrosis with renal and ureteral calculous obstruction: Secondary | ICD-10-CM | POA: Diagnosis not present

## 2016-06-18 DIAGNOSIS — N201 Calculus of ureter: Secondary | ICD-10-CM | POA: Diagnosis not present

## 2016-06-18 DIAGNOSIS — R3915 Urgency of urination: Secondary | ICD-10-CM | POA: Diagnosis not present

## 2016-06-18 DIAGNOSIS — N200 Calculus of kidney: Secondary | ICD-10-CM | POA: Diagnosis not present

## 2016-06-19 DIAGNOSIS — N3946 Mixed incontinence: Secondary | ICD-10-CM | POA: Diagnosis not present

## 2016-06-25 ENCOUNTER — Encounter: Payer: Self-pay | Admitting: Registered Nurse

## 2016-06-25 ENCOUNTER — Encounter: Payer: BLUE CROSS/BLUE SHIELD | Attending: Physical Medicine & Rehabilitation | Admitting: Registered Nurse

## 2016-06-25 VITALS — BP 116/76 | HR 84

## 2016-06-25 DIAGNOSIS — M545 Low back pain: Secondary | ICD-10-CM | POA: Diagnosis not present

## 2016-06-25 DIAGNOSIS — M797 Fibromyalgia: Secondary | ICD-10-CM | POA: Insufficient documentation

## 2016-06-25 DIAGNOSIS — M329 Systemic lupus erythematosus, unspecified: Secondary | ICD-10-CM | POA: Insufficient documentation

## 2016-06-25 DIAGNOSIS — M5416 Radiculopathy, lumbar region: Secondary | ICD-10-CM | POA: Diagnosis not present

## 2016-06-25 DIAGNOSIS — M6249 Contracture of muscle, multiple sites: Secondary | ICD-10-CM | POA: Insufficient documentation

## 2016-06-25 DIAGNOSIS — R569 Unspecified convulsions: Secondary | ICD-10-CM | POA: Insufficient documentation

## 2016-06-25 NOTE — Progress Notes (Signed)
Patient ID: Autumn Davis, female   DOB: 1985/12/30, 30 y.o.   MRN: [id number]   Chief Complaint: Lumbar Radiculopathy, Low Back Pain M54.16 M54.5 Bio-wave Treatment Medical History: Low Back pain/ Lumbar radiculopathy/ Fibromyalgia Syndrome/ Auto Immune Syndrome.  She had some relief with TENS but the relief was short term.  Procedure Performed: Procedure Performed: Percutaneous Electrical Nerve Stimulation ( PENS) #2.   Pain Score: 6  Informed Consent:Discussedalternative treatment options, possible risks, potential complications,andtreatment limitations.  Position: The patient was seated with torso at 90 degrees to the legs.  Description: The patient was taken to the exam room where the diagnostic implantation neurostimulation device through placement of needle array electrodes were percutaneously placed after identifying the point of maximal tenderness (L-4-L-5). The area was cleaned with an alcohol swab and then allowed to air dry. The needle array was removed from the packing and placed over the marked areas. Both thumbs were used to apply perpendicular pressure at greater than 10 pounds of pressure per square inch over the needle array penetrating each through the dermal layer into the subcutaneous layer. The patient was positioned seated upright and instructed on the use of the Biowave device so that the intensity could be controlled through the session. The device was activated to deliver the electrical signals through the skin into the deep tissue at the pain site. The treatment duration lasted 30 minutes,during which time someone stayed with the pt for the duration of treatment. The pt started at 28% intensity and reached 50 % intensity. At the completion of the treatment the electrodes were removed and placed in the sharps disposal container. The area was cleaned with an alcohol swab. Nobleedingnoted. The patient tolerated the procedure well with no complications.  Pre-treatment pain level was 6/10. The patient reports the pain level post treatment was 4/10. Follow up appointment scheduled for PENS treatment is 06/27/2016.     Medication List       Accurate as of 06/25/16 11:05 AM. Always use your most recent med list.          acetaZOLAMIDE 250 MG tablet Commonly known as:  DIAMOX Take 1 tablet (250 mg total) by mouth 3 (three) times daily. Take 250 mg two times a day; then 500 mg at bedtime   albuterol 108 (90 Base) MCG/ACT inhaler Commonly known as:  PROVENTIL HFA;VENTOLIN HFA Inhale 2 puffs into the lungs every 6 (six) hours as needed for wheezing or shortness of breath.   amitriptyline 75 MG tablet Commonly known as:  ELAVIL Take 1 tablet (75 mg total) by mouth at bedtime.   atorvastatin 40 MG tablet Commonly known as:  LIPITOR TAKE ONE (1) TABLET EACH DAY   baclofen 20 MG tablet Commonly known as:  LIORESAL Take 1 tablet (20 mg total) by mouth 4 (four) times daily.   CALCIUM 1200+D3 PO Take 1 tablet by mouth daily.   cetirizine 10 MG tablet Commonly known as:  ZYRTEC Take 10 mg by mouth daily.   cycloSPORINE 0.05 % ophthalmic emulsion Commonly known as:  RESTASIS Place 1 drop into both eyes 2 (two) times daily.   DEPLIN 15 MG Tabs Take by mouth.   dexlansoprazole 60 MG capsule Commonly known as:  DEXILANT Take 1 capsule (60 mg total) by mouth daily.   docusate sodium 100 MG capsule Commonly known as:  COLACE Take 100 mg by mouth 2 (two) times daily. PRN   EPINEPHrine 0.3 mg/0.3 mL Soaj injection Commonly known as:  EPI-PEN USE AS DIRECTED  furosemide 20 MG tablet Commonly known as:  LASIX TAKE ONE (1) TABLET EACH DAY   gabapentin 600 MG tablet Commonly known as:  NEURONTIN Take 1 tablet (600 mg total) by mouth 4 (four) times daily.   ipratropium-albuterol 0.5-2.5 (3) MG/3ML Soln Commonly known as:  DUONEB Take 3 mLs by nebulization every 4 (four) hours as needed.   lidocaine 5 % Commonly known as:   LIDODERM Place 3 patches onto the skin daily. Remove & Discard patch within 12 hours or as directed by MD   medroxyPROGESTERone 150 MG/ML injection Commonly known as:  DEPO-PROVERA Inject 150 mg into the muscle as directed. Every 2 months   meloxicam 15 MG tablet Commonly known as:  MOBIC TAKE ONE (1) TABLET EACH DAY   metoprolol succinate 25 MG 24 hr tablet Commonly known as:  TOPROL-XL TAKE ONE (1) TABLET EACH DAY   montelukast 10 MG tablet Commonly known as:  SINGULAIR TAKE ONE TABLET DAILY AT BEDTIME   Omega 3 1000 MG Caps Take 1 capsule by mouth daily.   oxyCODONE-acetaminophen 7.5-325 MG tablet Commonly known as:  PERCOCET Take 1 tablet by mouth 5 (five) times daily as needed for severe pain. No More Than 5 a day   predniSONE 5 MG tablet Commonly known as:  DELTASONE Take 1 tablet (5 mg total) by mouth every morning.   promethazine 12.5 MG tablet Commonly known as:  PHENERGAN Take 1 tablet (12.5 mg total) by mouth every 8 (eight) hours as needed for nausea or vomiting.   QNASL CHILDRENS 40 MCG/ACT Aers Generic drug:  Beclomethasone Dipropionate Place into the nose.   Vitamin D 2000 units tablet Take 2,000 Units by mouth daily.   vitamin E 400 UNIT capsule Generic drug:  vitamin E Take 400 Units by mouth daily.      Post Vitals: BP: 116/76 P: 79 Sat's: 95%

## 2016-06-27 ENCOUNTER — Encounter (HOSPITAL_BASED_OUTPATIENT_CLINIC_OR_DEPARTMENT_OTHER): Payer: BLUE CROSS/BLUE SHIELD | Admitting: Registered Nurse

## 2016-06-27 ENCOUNTER — Encounter: Payer: Self-pay | Admitting: Registered Nurse

## 2016-06-27 VITALS — BP 109/71 | HR 94 | Resp 14

## 2016-06-27 DIAGNOSIS — M5416 Radiculopathy, lumbar region: Secondary | ICD-10-CM

## 2016-06-27 DIAGNOSIS — M545 Low back pain: Secondary | ICD-10-CM

## 2016-06-27 NOTE — Progress Notes (Signed)
Patient ID: Autumn Davis, female   DOB: 12-18-1985, 30 y.o.   MRN: [id number]   Chief Complaint: Lumbar Radiculopathy, Low Back Pain M54.16 M54.5 Bio-wave Treatment Medical History: Low Back pain/ Lumbar radiculopathy/ Fibromyalgia Syndrome/ Auto Immune Syndrome.  She had some relief with TENS but the relief was short term.  Procedure Performed: Procedure Performed: Percutaneous Electrical Nerve Stimulation ( PENS) # 3.  Pre Vitals: BP: 122/80 P: 93 Sats: 97%  Pain Score: 4  Informed Consent:Discussedalternative treatment options, possible risks, potential complications,andtreatment limitations.  Position: The patient was seated with torso at 90 degrees to the legs.  Description: The patient was taken to the exam room where the diagnostic implantation neurostimulation device through placement of needle array electrodes were percutaneously placed after identifying the point of maximal tenderness (L-4-L-5). The area was cleaned with an alcohol swab and then allowed to air dry. The needle array was removed from the packing and placed over the marked areas. Both thumbs were used to apply perpendicular pressure at greater than 10 pounds of pressure per square inch over the needle array penetrating each through the dermal layer into the subcutaneous layer. The patient was positioned seated upright and instructed on the use of the Biowave device so that the intensity could be controlled through the session. The device was activated to deliver the electrical signals through the skin into the deep tissue at the pain site. The treatment duration lasted 30 minutes,during which time someone stayed with the pt for the duration of treatment. The pt started at 28% intensity and reached 52 % intensity. At the completion of the treatment the electrodes were removed and placed in the sharps disposal container. The area was cleaned with an alcohol swab. Nobleedingnoted. The patient tolerated the  procedure well with no complications. Pre-treatment pain level was 4/10. The patient reports the pain level post treatment was 2/10. Follow up appointment scheduled for PENS treatment is 06/30/2016.     Medication List       Accurate as of 06/27/16 10:37 AM. Always use your most recent med list.          acetaZOLAMIDE 250 MG tablet Commonly known as:  DIAMOX Take 1 tablet (250 mg total) by mouth 3 (three) times daily. Take 250 mg two times a day; then 500 mg at bedtime   albuterol 108 (90 Base) MCG/ACT inhaler Commonly known as:  PROVENTIL HFA;VENTOLIN HFA Inhale 2 puffs into the lungs every 6 (six) hours as needed for wheezing or shortness of breath.   amitriptyline 75 MG tablet Commonly known as:  ELAVIL Take 1 tablet (75 mg total) by mouth at bedtime.   atorvastatin 40 MG tablet Commonly known as:  LIPITOR TAKE ONE (1) TABLET EACH DAY   baclofen 20 MG tablet Commonly known as:  LIORESAL Take 1 tablet (20 mg total) by mouth 4 (four) times daily.   CALCIUM 1200+D3 PO Take 1 tablet by mouth daily.   cetirizine 10 MG tablet Commonly known as:  ZYRTEC Take 10 mg by mouth daily.   cycloSPORINE 0.05 % ophthalmic emulsion Commonly known as:  RESTASIS Place 1 drop into both eyes 2 (two) times daily.   DEPLIN 15 MG Tabs Take by mouth.   dexlansoprazole 60 MG capsule Commonly known as:  DEXILANT Take 1 capsule (60 mg total) by mouth daily.   docusate sodium 100 MG capsule Commonly known as:  COLACE Take 100 mg by mouth 2 (two) times daily. PRN   EPINEPHrine 0.3 mg/0.3 mL Soaj injection  Commonly known as:  EPI-PEN USE AS DIRECTED   furosemide 20 MG tablet Commonly known as:  LASIX TAKE ONE (1) TABLET EACH DAY   gabapentin 600 MG tablet Commonly known as:  NEURONTIN Take 1 tablet (600 mg total) by mouth 4 (four) times daily.   ipratropium-albuterol 0.5-2.5 (3) MG/3ML Soln Commonly known as:  DUONEB Take 3 mLs by nebulization every 4 (four) hours as needed.    lidocaine 5 % Commonly known as:  LIDODERM Place 3 patches onto the skin daily. Remove & Discard patch within 12 hours or as directed by MD   medroxyPROGESTERone 150 MG/ML injection Commonly known as:  DEPO-PROVERA Inject 150 mg into the muscle as directed. Every 2 months   meloxicam 15 MG tablet Commonly known as:  MOBIC TAKE ONE (1) TABLET EACH DAY   metoprolol succinate 25 MG 24 hr tablet Commonly known as:  TOPROL-XL TAKE ONE (1) TABLET EACH DAY   montelukast 10 MG tablet Commonly known as:  SINGULAIR TAKE ONE TABLET DAILY AT BEDTIME   Omega 3 1000 MG Caps Take 1 capsule by mouth daily.   oxyCODONE-acetaminophen 7.5-325 MG tablet Commonly known as:  PERCOCET Take 1 tablet by mouth 5 (five) times daily as needed for severe pain. No More Than 5 a day   predniSONE 5 MG tablet Commonly known as:  DELTASONE Take 1 tablet (5 mg total) by mouth every morning.   promethazine 12.5 MG tablet Commonly known as:  PHENERGAN Take 1 tablet (12.5 mg total) by mouth every 8 (eight) hours as needed for nausea or vomiting.   QNASL CHILDRENS 40 MCG/ACT Aers Generic drug:  Beclomethasone Dipropionate Place into the nose.   Vitamin D 2000 units tablet Take 2,000 Units by mouth daily.   vitamin E 400 UNIT capsule Generic drug:  vitamin E Take 400 Units by mouth daily.      Post Vitals: BP: 109/71 P: 92 Sat's: 95%

## 2016-06-30 ENCOUNTER — Encounter (HOSPITAL_BASED_OUTPATIENT_CLINIC_OR_DEPARTMENT_OTHER): Payer: BLUE CROSS/BLUE SHIELD | Admitting: Registered Nurse

## 2016-06-30 ENCOUNTER — Encounter: Payer: Self-pay | Admitting: Registered Nurse

## 2016-06-30 VITALS — BP 111/77 | HR 90 | Resp 14

## 2016-06-30 DIAGNOSIS — M545 Low back pain: Secondary | ICD-10-CM

## 2016-06-30 DIAGNOSIS — M5416 Radiculopathy, lumbar region: Secondary | ICD-10-CM | POA: Diagnosis not present

## 2016-06-30 NOTE — Progress Notes (Signed)
Patient ID: Autumn Davis, female   DOB: 1986/02/28, 30 y.o.   MRN: [id number]   Chief Complaint: Lumbar Radiculopathy, Low Back Pain M54.16 M54.5 Bio-wave Treatment Medical History: Low Back pain/ Lumbar radiculopathy/ Fibromyalgia Syndrome/ Auto Immune Syndrome.  She had some relief with TENS but the relief was short term.  Procedure Performed: Procedure Performed: Percutaneous Electrical Nerve Stimulation ( PENS) # 4.  Pre Vitals: BP: 111/77 P: 91 Sats: 96%  Pain Score: 6  Informed Consent:Discussedalternative treatment options, possible risks, potential complications,andtreatment limitations.  Position: The patient was seated with torso at 90 degrees to the legs.  Description: The patient was taken to the exam room where the diagnostic implantation neurostimulation device through placement of needle array electrodes were percutaneously placed after identifying the point of maximal tenderness (L-4-L-5). The area was cleaned with an alcohol swab and then allowed to air dry. The needle array was removed from the packing and placed over the marked areas. Both thumbs were used to apply perpendicular pressure at greater than 10 pounds of pressure per square inch over the needle array penetrating each through the dermal layer into the subcutaneous layer. The patient was positioned seated upright and instructed on the use of the Biowave device so that the intensity could be controlled through the session. The device was activated to deliver the electrical signals through the skin into the deep tissue at the pain site. The treatment duration lasted 30 minutes,during which time someone stayed with the pt for the duration of treatment. The pt started at 30% intensity and reached 46 % intensity. At the completion of the treatment the electrodes were removed and placed in the sharps disposal container. The area was cleaned with an alcohol swab. Nobleedingnoted. The patient tolerated the  procedure well with no complications. Pre-treatment pain level was 4/10. The patient reports the pain level post treatment was 3/10. Follow up appointment scheduled for PENS treatment is 07/02/2016.     Medication List       Accurate as of 06/30/16 11:14 AM. Always use your most recent med list.          acetaZOLAMIDE 250 MG tablet Commonly known as:  DIAMOX Take 1 tablet (250 mg total) by mouth 3 (three) times daily. Take 250 mg two times a day; then 500 mg at bedtime   albuterol 108 (90 Base) MCG/ACT inhaler Commonly known as:  PROVENTIL HFA;VENTOLIN HFA Inhale 2 puffs into the lungs every 6 (six) hours as needed for wheezing or shortness of breath.   amitriptyline 75 MG tablet Commonly known as:  ELAVIL Take 1 tablet (75 mg total) by mouth at bedtime.   atorvastatin 40 MG tablet Commonly known as:  LIPITOR TAKE ONE (1) TABLET EACH DAY   baclofen 20 MG tablet Commonly known as:  LIORESAL Take 1 tablet (20 mg total) by mouth 4 (four) times daily.   CALCIUM 1200+D3 PO Take 1 tablet by mouth daily.   cetirizine 10 MG tablet Commonly known as:  ZYRTEC Take 10 mg by mouth daily.   cycloSPORINE 0.05 % ophthalmic emulsion Commonly known as:  RESTASIS Place 1 drop into both eyes 2 (two) times daily.   DEPLIN 15 MG Tabs Take by mouth.   dexlansoprazole 60 MG capsule Commonly known as:  DEXILANT Take 1 capsule (60 mg total) by mouth daily.   docusate sodium 100 MG capsule Commonly known as:  COLACE Take 100 mg by mouth 2 (two) times daily. PRN   EPINEPHrine 0.3 mg/0.3 mL Soaj injection  Commonly known as:  EPI-PEN USE AS DIRECTED   furosemide 20 MG tablet Commonly known as:  LASIX TAKE ONE (1) TABLET EACH DAY   gabapentin 600 MG tablet Commonly known as:  NEURONTIN Take 1 tablet (600 mg total) by mouth 4 (four) times daily.   ipratropium-albuterol 0.5-2.5 (3) MG/3ML Soln Commonly known as:  DUONEB Take 3 mLs by nebulization every 4 (four) hours as needed.    lidocaine 5 % Commonly known as:  LIDODERM Place 3 patches onto the skin daily. Remove & Discard patch within 12 hours or as directed by MD   medroxyPROGESTERone 150 MG/ML injection Commonly known as:  DEPO-PROVERA Inject 150 mg into the muscle as directed. Every 2 months   meloxicam 15 MG tablet Commonly known as:  MOBIC TAKE ONE (1) TABLET EACH DAY   metoprolol succinate 25 MG 24 hr tablet Commonly known as:  TOPROL-XL TAKE ONE (1) TABLET EACH DAY   montelukast 10 MG tablet Commonly known as:  SINGULAIR TAKE ONE TABLET DAILY AT BEDTIME   Omega 3 1000 MG Caps Take 1 capsule by mouth daily.   oxyCODONE-acetaminophen 7.5-325 MG tablet Commonly known as:  PERCOCET Take 1 tablet by mouth 5 (five) times daily as needed for severe pain. No More Than 5 a day   predniSONE 5 MG tablet Commonly known as:  DELTASONE Take 1 tablet (5 mg total) by mouth every morning.   promethazine 12.5 MG tablet Commonly known as:  PHENERGAN Take 1 tablet (12.5 mg total) by mouth every 8 (eight) hours as needed for nausea or vomiting.   QNASL CHILDRENS 40 MCG/ACT Aers Generic drug:  Beclomethasone Dipropionate Place into the nose.   Vitamin D 2000 units tablet Take 2,000 Units by mouth daily.   vitamin E 400 UNIT capsule Generic drug:  vitamin E Take 400 Units by mouth daily.      Post Vitals:  BP: 115/80 P: 85 Sats: 97%

## 2016-07-02 ENCOUNTER — Encounter (HOSPITAL_BASED_OUTPATIENT_CLINIC_OR_DEPARTMENT_OTHER): Payer: BLUE CROSS/BLUE SHIELD | Admitting: Registered Nurse

## 2016-07-02 ENCOUNTER — Encounter: Payer: Self-pay | Admitting: Registered Nurse

## 2016-07-02 VITALS — BP 119/84 | HR 90 | Resp 14

## 2016-07-02 DIAGNOSIS — M5416 Radiculopathy, lumbar region: Secondary | ICD-10-CM

## 2016-07-02 DIAGNOSIS — M545 Low back pain: Secondary | ICD-10-CM | POA: Diagnosis not present

## 2016-07-02 NOTE — Progress Notes (Signed)
Patient ID: Autumn Davis, female   DOB: 1986-07-17, 30 y.o.   MRN: [id number]   Chief Complaint: Lumbar Radiculopathy, Low Back Pain M54.16 M54.5 Bio-wave Treatment Medical History: Low Back pain/ Lumbar radiculopathy/ Fibromyalgia Syndrome/ Auto Immune Syndrome.  She had some relief with TENS but the relief was short term.  Procedure Performed: Procedure Performed: Percutaneous Electrical Nerve Stimulation ( PENS) # 5.  Pre Vitals: BP: 124/80 P: 83 Sats: 96%  Pain Score: 4  Informed Consent:Discussedalternative treatment options, possible risks, potential complications,andtreatment limitations.  Position: The patient was seated with torso at 90 degrees to the legs.  Description: The patient was taken to the exam room where the diagnostic implantation neurostimulation device through placement of needle array electrodes were percutaneously placed after identifying the point of maximal tenderness (L-4-L-5). The area was cleaned with an alcohol swab and then allowed to air dry. The needle array was removed from the packing and placed over the marked areas. Both thumbs were used to apply perpendicular pressure at greater than 10 pounds of pressure per square inch over the needle array penetrating each through the dermal layer into the subcutaneous layer. The patient was positioned seated upright and instructed on the use of the Biowave device so that the intensity could be controlled through the session. The device was activated to deliver the electrical signals through the skin into the deep tissue at the pain site. The treatment duration lasted 30 minutes,during which time someone stayed with the pt for the duration of treatment. The pt started at 26 % intensity and reached 52% intensity. At the completion of the treatment the electrodes were removed and placed in the sharps disposal container. The area was cleaned with an alcohol swab. Nobleedingnoted. The patient tolerated the  procedure well with no complications. Pre-treatment pain level was 4/10. The patient reports the pain level post treatment was 2/10. Follow up appointment scheduled for PENS treatment is 07/04/2016.     Medication List       Accurate as of 07/02/16 10:25 AM. Always use your most recent med list.          acetaZOLAMIDE 250 MG tablet Commonly known as:  DIAMOX Take 1 tablet (250 mg total) by mouth 3 (three) times daily. Take 250 mg two times a day; then 500 mg at bedtime   albuterol 108 (90 Base) MCG/ACT inhaler Commonly known as:  PROVENTIL HFA;VENTOLIN HFA Inhale 2 puffs into the lungs every 6 (six) hours as needed for wheezing or shortness of breath.   amitriptyline 75 MG tablet Commonly known as:  ELAVIL Take 1 tablet (75 mg total) by mouth at bedtime.   atorvastatin 40 MG tablet Commonly known as:  LIPITOR TAKE ONE (1) TABLET EACH DAY   baclofen 20 MG tablet Commonly known as:  LIORESAL Take 1 tablet (20 mg total) by mouth 4 (four) times daily.   CALCIUM 1200+D3 PO Take 1 tablet by mouth daily.   cetirizine 10 MG tablet Commonly known as:  ZYRTEC Take 10 mg by mouth daily.   cycloSPORINE 0.05 % ophthalmic emulsion Commonly known as:  RESTASIS Place 1 drop into both eyes 2 (two) times daily.   DEPLIN 15 MG Tabs Take by mouth.   dexlansoprazole 60 MG capsule Commonly known as:  DEXILANT Take 1 capsule (60 mg total) by mouth daily.   docusate sodium 100 MG capsule Commonly known as:  COLACE Take 100 mg by mouth 2 (two) times daily. PRN   EPINEPHrine 0.3 mg/0.3 mL Soaj injection  Commonly known as:  EPI-PEN USE AS DIRECTED   furosemide 20 MG tablet Commonly known as:  LASIX TAKE ONE (1) TABLET EACH DAY   gabapentin 600 MG tablet Commonly known as:  NEURONTIN Take 1 tablet (600 mg total) by mouth 4 (four) times daily.   ipratropium-albuterol 0.5-2.5 (3) MG/3ML Soln Commonly known as:  DUONEB Take 3 mLs by nebulization every 4 (four) hours as needed.    lidocaine 5 % Commonly known as:  LIDODERM Place 3 patches onto the skin daily. Remove & Discard patch within 12 hours or as directed by MD   medroxyPROGESTERone 150 MG/ML injection Commonly known as:  DEPO-PROVERA Inject 150 mg into the muscle as directed. Every 2 months   meloxicam 15 MG tablet Commonly known as:  MOBIC TAKE ONE (1) TABLET EACH DAY   metoprolol succinate 25 MG 24 hr tablet Commonly known as:  TOPROL-XL TAKE ONE (1) TABLET EACH DAY   montelukast 10 MG tablet Commonly known as:  SINGULAIR TAKE ONE TABLET DAILY AT BEDTIME   Omega 3 1000 MG Caps Take 1 capsule by mouth daily.   oxyCODONE-acetaminophen 7.5-325 MG tablet Commonly known as:  PERCOCET Take 1 tablet by mouth 5 (five) times daily as needed for severe pain. No More Than 5 a day   predniSONE 5 MG tablet Commonly known as:  DELTASONE Take 1 tablet (5 mg total) by mouth every morning.   promethazine 12.5 MG tablet Commonly known as:  PHENERGAN Take 1 tablet (12.5 mg total) by mouth every 8 (eight) hours as needed for nausea or vomiting.   QNASL CHILDRENS 40 MCG/ACT Aers Generic drug:  Beclomethasone Dipropionate Place into the nose.   Vitamin D 2000 units tablet Take 2,000 Units by mouth daily.   vitamin E 400 UNIT capsule Generic drug:  vitamin E Take 400 Units by mouth daily.      Post  Vitals: BP: 119/84 P: 90 Sats 98%

## 2016-07-04 ENCOUNTER — Encounter: Payer: Self-pay | Admitting: Registered Nurse

## 2016-07-04 ENCOUNTER — Encounter (HOSPITAL_BASED_OUTPATIENT_CLINIC_OR_DEPARTMENT_OTHER): Payer: BLUE CROSS/BLUE SHIELD | Admitting: Registered Nurse

## 2016-07-04 VITALS — BP 143/96 | HR 91

## 2016-07-04 DIAGNOSIS — M6249 Contracture of muscle, multiple sites: Secondary | ICD-10-CM | POA: Diagnosis not present

## 2016-07-04 DIAGNOSIS — R569 Unspecified convulsions: Secondary | ICD-10-CM | POA: Diagnosis present

## 2016-07-04 DIAGNOSIS — M545 Low back pain: Secondary | ICD-10-CM

## 2016-07-04 DIAGNOSIS — M329 Systemic lupus erythematosus, unspecified: Secondary | ICD-10-CM | POA: Diagnosis present

## 2016-07-04 DIAGNOSIS — M5416 Radiculopathy, lumbar region: Secondary | ICD-10-CM

## 2016-07-04 DIAGNOSIS — M797 Fibromyalgia: Secondary | ICD-10-CM | POA: Diagnosis present

## 2016-07-04 NOTE — Progress Notes (Signed)
Patient ID: Autumn Davis, female   DOB: [birth year], 30 y.o.   MRN: [id number]   Chief Complaint: Lumbar Radiculopathy, Low Back Pain M54.16 M54.5 Bio-wave Treatment Medical History: Low Back pain/ Lumbar radiculopathy/ Fibromyalgia Syndrome/ Auto Immune Syndrome.  She had some relief with TENS but the relief was short term.  Procedure Performed: Procedure Performed: Percutaneous Electrical Nerve Stimulation ( PENS) # 6.  Pre Vitals: BP: 143/96P: 91Sats: 98%  Pain Score: 5  Informed Consent:Discussedalternative treatment options, possible risks, potential complications,andtreatment limitations.  Position: The patient was seated with torso at 90 degrees to the legs.  Description: The patient was taken to the exam room where the diagnostic implantation neurostimulation device through placement of needle array electrodes were percutaneously placed after identifying the point of maximal tenderness (L-4-L-5). The area was cleaned with an alcohol swab and then allowed to air dry. The needle array was removed from the packing and placed over the marked areas. Both thumbs were used to apply perpendicular pressure at greater than 10 pounds of pressure per square inch over the needle array penetrating each through the dermal layer into the subcutaneous layer. The patient was positioned seated upright and instructed on the use of the Biowave device so that the intensity could be controlled through the session. The device was activated to deliver the electrical signals through the skin into the deep tissue at the pain site. The treatment duration lasted 30 minutes,during which time someone stayed with the pt for the duration of treatment. The pt started at 32 % intensity and reached 55% intensity. At the completion of the treatment the electrodes were removed and placed in the sharps disposal container. The area was cleaned with an alcohol swab. Nobleedingnoted. The patient tolerated the  procedure well with no complications. Pre-treatment pain level was 4/10. The patient reports the pain level post treatment was 2/10. Follow up appointment scheduled for PENS treatment is 07/09/2016.     Medication List       Accurate as of 07/04/16 11:00 AM. Always use your most recent med list.          acetaZOLAMIDE 250 MG tablet Commonly known as:  DIAMOX Take 1 tablet (250 mg total) by mouth 3 (three) times daily. Take 250 mg two times a day; then 500 mg at bedtime   albuterol 108 (90 Base) MCG/ACT inhaler Commonly known as:  PROVENTIL HFA;VENTOLIN HFA Inhale 2 puffs into the lungs every 6 (six) hours as needed for wheezing or shortness of breath.   amitriptyline 75 MG tablet Commonly known as:  ELAVIL Take 1 tablet (75 mg total) by mouth at bedtime.   atorvastatin 40 MG tablet Commonly known as:  LIPITOR TAKE ONE (1) TABLET EACH DAY   baclofen 20 MG tablet Commonly known as:  LIORESAL Take 1 tablet (20 mg total) by mouth 4 (four) times daily.   CALCIUM 1200+D3 PO Take 1 tablet by mouth daily.   cetirizine 10 MG tablet Commonly known as:  ZYRTEC Take 10 mg by mouth daily.   cycloSPORINE 0.05 % ophthalmic emulsion Commonly known as:  RESTASIS Place 1 drop into both eyes 2 (two) times daily.   DEPLIN 15 MG Tabs Take by mouth.   dexlansoprazole 60 MG capsule Commonly known as:  DEXILANT Take 1 capsule (60 mg total) by mouth daily.   docusate sodium 100 MG capsule Commonly known as:  COLACE Take 100 mg by mouth 2 (two) times daily. PRN   EPINEPHrine 0.3 mg/0.3 mL Soaj injection Commonly known  as:  EPI-PEN USE AS DIRECTED   furosemide 20 MG tablet Commonly known as:  LASIX TAKE ONE (1) TABLET EACH DAY   gabapentin 600 MG tablet Commonly known as:  NEURONTIN Take 1 tablet (600 mg total) by mouth 4 (four) times daily.   ipratropium-albuterol 0.5-2.5 (3) MG/3ML Soln Commonly known as:  DUONEB Take 3 mLs by nebulization every 4 (four) hours as needed.    lidocaine 5 % Commonly known as:  LIDODERM Place 3 patches onto the skin daily. Remove & Discard patch within 12 hours or as directed by MD   medroxyPROGESTERone 150 MG/ML injection Commonly known as:  DEPO-PROVERA Inject 150 mg into the muscle as directed. Every 2 months   meloxicam 15 MG tablet Commonly known as:  MOBIC TAKE ONE (1) TABLET EACH DAY   metoprolol succinate 25 MG 24 hr tablet Commonly known as:  TOPROL-XL TAKE ONE (1) TABLET EACH DAY   montelukast 10 MG tablet Commonly known as:  SINGULAIR TAKE ONE TABLET DAILY AT BEDTIME   Omega 3 1000 MG Caps Take 1 capsule by mouth daily.   oxyCODONE-acetaminophen 7.5-325 MG tablet Commonly known as:  PERCOCET Take 1 tablet by mouth 5 (five) times daily as needed for severe pain. No More Than 5 a day   predniSONE 5 MG tablet Commonly known as:  DELTASONE Take 1 tablet (5 mg total) by mouth every morning.   promethazine 12.5 MG tablet Commonly known as:  PHENERGAN Take 1 tablet (12.5 mg total) by mouth every 8 (eight) hours as needed for nausea or vomiting.   QNASL CHILDRENS 40 MCG/ACT Aers Generic drug:  Beclomethasone Dipropionate Place into the nose.   Vitamin D 2000 units tablet Take 2,000 Units by mouth daily.   vitamin E 400 UNIT capsule Generic drug:  vitamin E Take 400 Units by mouth daily.       Post Vitals: BP: 122/83 P: 87 Sat's: 98%

## 2016-07-09 ENCOUNTER — Encounter: Payer: Self-pay | Admitting: Physical Medicine & Rehabilitation

## 2016-07-09 ENCOUNTER — Encounter (HOSPITAL_BASED_OUTPATIENT_CLINIC_OR_DEPARTMENT_OTHER): Payer: BLUE CROSS/BLUE SHIELD | Admitting: Physical Medicine & Rehabilitation

## 2016-07-09 VITALS — BP 110/78 | HR 92 | Resp 14

## 2016-07-09 DIAGNOSIS — M6249 Contracture of muscle, multiple sites: Secondary | ICD-10-CM | POA: Diagnosis not present

## 2016-07-09 DIAGNOSIS — M5417 Radiculopathy, lumbosacral region: Secondary | ICD-10-CM

## 2016-07-09 DIAGNOSIS — M545 Low back pain: Secondary | ICD-10-CM

## 2016-07-09 DIAGNOSIS — G8929 Other chronic pain: Secondary | ICD-10-CM

## 2016-07-09 DIAGNOSIS — M5416 Radiculopathy, lumbar region: Secondary | ICD-10-CM

## 2016-07-09 NOTE — Progress Notes (Signed)
Patient ID: Autumn Davis, female   DOB: 1986-08-29, 30 y.o.   MRN: [id number]   Chief Complaint: Lumbar Radiculopathy, Low Back Pain M54.16 M54.5 Bio-wave Treatment Medical History: Low Back pain/ Lumbar radiculopathy/ Fibromyalgia Syndrome/ Auto Immune Syndrome.  She had some relief with TENS but the relief was short term.  Procedure Performed: Procedure Performed: Percutaneous Electrical Nerve Stimulation ( PENS) # 7. BP 120/67   Pulse 95   Resp 16   SpO2 95%   Pain Score: 6  Informed Consent:Discussedalternative treatment options, possible risks, potential complications,andtreatment limitations.  Position: The patient was seated with torso at 90 degrees to the legs.  Description: The patient was taken to the exam room where the diagnostic implantation neurostimulation device through placement of needle array electrodes were percutaneously placed after identifying the point of maximal tenderness at lateral mid thoracic muscles. The area was cleaned with an alcohol swab and then allowed to air dry. The needle array was removed from the packing and placed over the marked areas. Both thumbs were used to apply perpendicular pressure at greater than 10 pounds of pressure per square inch over the needle array penetrating each through the dermal layer into the subcutaneous layer. The patient was positioned seated upright and instructed on the use of the Biowave device so that the intensity could be controlled through the session. The device was activated to deliver the electrical signals through the skin into the deep tissue at the pain site. The treatment duration lasted 30 minutes,during which time someone stayed with the pt for the duration of treatment. The pt started at 26% intensity and reached 42 % intensity. At the completion of the treatment the electrodes were removed and placed in the sharps disposal container. The area was cleaned with an alcohol swab. Nobleedingnoted.  The patient tolerated the procedure well with no complications. Pre-treatment pain level was 6/10. The patient reports the pain level post treatment was 2/10. Follow up appointment scheduled for PENS treatment is 07/11/2016. Improvement with ROM/Stretching.    Medication List       Accurate as of 07/09/16 11:46 AM. Always use your most recent med list.          acetaZOLAMIDE 250 MG tablet Commonly known as:  DIAMOX Take 1 tablet (250 mg total) by mouth 3 (three) times daily. Take 250 mg two times a day; then 500 mg at bedtime   albuterol 108 (90 Base) MCG/ACT inhaler Commonly known as:  PROVENTIL HFA;VENTOLIN HFA Inhale 2 puffs into the lungs every 6 (six) hours as needed for wheezing or shortness of breath.   amitriptyline 75 MG tablet Commonly known as:  ELAVIL Take 1 tablet (75 mg total) by mouth at bedtime.   atorvastatin 40 MG tablet Commonly known as:  LIPITOR TAKE ONE (1) TABLET EACH DAY   baclofen 20 MG tablet Commonly known as:  LIORESAL Take 1 tablet (20 mg total) by mouth 4 (four) times daily.   CALCIUM 1200+D3 PO Take 1 tablet by mouth daily.   cetirizine 10 MG tablet Commonly known as:  ZYRTEC Take 10 mg by mouth daily.   cycloSPORINE 0.05 % ophthalmic emulsion Commonly known as:  RESTASIS Place 1 drop into both eyes 2 (two) times daily.   DEPLIN 15 MG Tabs Take by mouth.   dexlansoprazole 60 MG capsule Commonly known as:  DEXILANT Take 1 capsule (60 mg total) by mouth daily.   docusate sodium 100 MG capsule Commonly known as:  COLACE Take 100 mg by mouth 2 (  two) times daily. PRN   EPINEPHrine 0.3 mg/0.3 mL Soaj injection Commonly known as:  EPI-PEN USE AS DIRECTED   furosemide 20 MG tablet Commonly known as:  LASIX TAKE ONE (1) TABLET EACH DAY   gabapentin 600 MG tablet Commonly known as:  NEURONTIN Take 1 tablet (600 mg total) by mouth 4 (four) times daily.   ipratropium-albuterol 0.5-2.5 (3) MG/3ML Soln Commonly known as:   DUONEB Take 3 mLs by nebulization every 4 (four) hours as needed.   lidocaine 5 % Commonly known as:  LIDODERM Place 3 patches onto the skin daily. Remove & Discard patch within 12 hours or as directed by MD   medroxyPROGESTERone 150 MG/ML injection Commonly known as:  DEPO-PROVERA Inject 150 mg into the muscle as directed. Every 2 months   meloxicam 15 MG tablet Commonly known as:  MOBIC TAKE ONE (1) TABLET EACH DAY   metoprolol succinate 25 MG 24 hr tablet Commonly known as:  TOPROL-XL TAKE ONE (1) TABLET EACH DAY   montelukast 10 MG tablet Commonly known as:  SINGULAIR TAKE ONE TABLET DAILY AT BEDTIME   Omega 3 1000 MG Caps Take 1 capsule by mouth daily.   oxyCODONE-acetaminophen 7.5-325 MG tablet Commonly known as:  PERCOCET Take 1 tablet by mouth 5 (five) times daily as needed for severe pain. No More Than 5 a day   predniSONE 5 MG tablet Commonly known as:  DELTASONE Take 1 tablet (5 mg total) by mouth every morning.   promethazine 12.5 MG tablet Commonly known as:  PHENERGAN Take 1 tablet (12.5 mg total) by mouth every 8 (eight) hours as needed for nausea or vomiting.   QNASL CHILDRENS 40 MCG/ACT Aers Generic drug:  Beclomethasone Dipropionate Place into the nose.   tamsulosin 0.4 MG Caps capsule Commonly known as:  FLOMAX Take 0.4 mg by mouth daily.   Vitamin D 2000 units tablet Take 2,000 Units by mouth daily.   vitamin E 400 UNIT capsule Generic drug:  vitamin E Take 400 Units by mouth daily.      Post Vitals: BP: 110/78 P: 92 Sat's: 97%

## 2016-07-10 ENCOUNTER — Encounter: Payer: Self-pay | Admitting: Nurse Practitioner

## 2016-07-10 ENCOUNTER — Ambulatory Visit (INDEPENDENT_AMBULATORY_CARE_PROVIDER_SITE_OTHER): Payer: BLUE CROSS/BLUE SHIELD | Admitting: Nurse Practitioner

## 2016-07-10 VITALS — BP 127/78 | HR 91 | Temp 97.5°F | Ht 68.0 in | Wt 221.0 lb

## 2016-07-10 DIAGNOSIS — R739 Hyperglycemia, unspecified: Secondary | ICD-10-CM

## 2016-07-10 DIAGNOSIS — M329 Systemic lupus erythematosus, unspecified: Secondary | ICD-10-CM

## 2016-07-10 DIAGNOSIS — R1084 Generalized abdominal pain: Secondary | ICD-10-CM

## 2016-07-10 DIAGNOSIS — F329 Major depressive disorder, single episode, unspecified: Secondary | ICD-10-CM | POA: Diagnosis not present

## 2016-07-10 DIAGNOSIS — D8989 Other specified disorders involving the immune mechanism, not elsewhere classified: Secondary | ICD-10-CM

## 2016-07-10 DIAGNOSIS — K219 Gastro-esophageal reflux disease without esophagitis: Secondary | ICD-10-CM

## 2016-07-10 DIAGNOSIS — I479 Paroxysmal tachycardia, unspecified: Secondary | ICD-10-CM

## 2016-07-10 DIAGNOSIS — M797 Fibromyalgia: Secondary | ICD-10-CM | POA: Diagnosis not present

## 2016-07-10 DIAGNOSIS — R1310 Dysphagia, unspecified: Secondary | ICD-10-CM | POA: Diagnosis not present

## 2016-07-10 DIAGNOSIS — R569 Unspecified convulsions: Secondary | ICD-10-CM

## 2016-07-10 DIAGNOSIS — E079 Disorder of thyroid, unspecified: Secondary | ICD-10-CM | POA: Diagnosis not present

## 2016-07-10 DIAGNOSIS — K589 Irritable bowel syndrome without diarrhea: Secondary | ICD-10-CM

## 2016-07-10 DIAGNOSIS — L930 Discoid lupus erythematosus: Secondary | ICD-10-CM

## 2016-07-10 DIAGNOSIS — F32A Depression, unspecified: Secondary | ICD-10-CM

## 2016-07-10 DIAGNOSIS — R609 Edema, unspecified: Secondary | ICD-10-CM

## 2016-07-10 DIAGNOSIS — E785 Hyperlipidemia, unspecified: Secondary | ICD-10-CM | POA: Diagnosis not present

## 2016-07-10 DIAGNOSIS — R7309 Other abnormal glucose: Secondary | ICD-10-CM

## 2016-07-10 MED ORDER — FUROSEMIDE 20 MG PO TABS: ORAL_TABLET | ORAL | 5 refills | Status: DC

## 2016-07-10 MED ORDER — TAMSULOSIN HCL 0.4 MG PO CAPS: 0.4000 mg | ORAL_CAPSULE | Freq: Every day | ORAL | 5 refills | Status: DC

## 2016-07-10 MED ORDER — GABAPENTIN 600 MG PO TABS: 600.0000 mg | ORAL_TABLET | Freq: Four times a day (QID) | ORAL | 3 refills | Status: DC

## 2016-07-10 MED ORDER — BACLOFEN 20 MG PO TABS: 20.0000 mg | ORAL_TABLET | Freq: Four times a day (QID) | ORAL | 3 refills | Status: DC

## 2016-07-10 MED ORDER — LIDOCAINE 5 % EX PTCH: 3.0000 | MEDICATED_PATCH | CUTANEOUS | 4 refills | Status: DC

## 2016-07-10 MED ORDER — NYSTATIN-TRIAMCINOLONE 100000-0.1 UNIT/GM-% EX OINT: 1.0000 "application " | TOPICAL_OINTMENT | Freq: Two times a day (BID) | CUTANEOUS | 0 refills | Status: AC

## 2016-07-10 MED ORDER — METOPROLOL SUCCINATE ER 25 MG PO TB24: ORAL_TABLET | ORAL | 1 refills | Status: DC

## 2016-07-10 MED ORDER — AMITRIPTYLINE HCL 75 MG PO TABS: 75.0000 mg | ORAL_TABLET | Freq: Every day | ORAL | 4 refills | Status: DC

## 2016-07-10 MED ORDER — ACETAZOLAMIDE 250 MG PO TABS: 250.0000 mg | ORAL_TABLET | Freq: Three times a day (TID) | ORAL | 5 refills | Status: DC

## 2016-07-10 MED ORDER — ATORVASTATIN CALCIUM 40 MG PO TABS: ORAL_TABLET | ORAL | 1 refills | Status: DC

## 2016-07-10 MED ORDER — DEXLANSOPRAZOLE 60 MG PO CPDR: 60.0000 mg | DELAYED_RELEASE_CAPSULE | Freq: Every day | ORAL | 5 refills | Status: DC

## 2016-07-10 NOTE — Progress Notes (Signed)
Subjective:    Patient ID: Autumn Davis, female    DOB: [birth year], 30 y.o.   MRN: [id number]  Patient here today for follow up of chronic medical problems. NO major changes since last visit.  Outpatient Encounter Prescriptions as of 07/10/2016  Medication Sig  . acetaZOLAMIDE (DIAMOX) 250 MG tablet Take 1 tablet (250 mg total) by mouth 3 (three) times daily. Take 250 mg two times a day; then 500 mg at bedtime  . albuterol (PROVENTIL HFA;VENTOLIN HFA) 108 (90 BASE) MCG/ACT inhaler Inhale 2 puffs into the lungs every 6 (six) hours as needed for wheezing or shortness of breath.  Marland Kitchen amitriptyline (ELAVIL) 75 MG tablet Take 1 tablet (75 mg total) by mouth at bedtime.  Marland Kitchen atorvastatin (LIPITOR) 40 MG tablet TAKE ONE (1) TABLET EACH DAY  . baclofen (LIORESAL) 20 MG tablet Take 1 tablet (20 mg total) by mouth 4 (four) times daily.  . Beclomethasone Dipropionate (QNASL CHILDRENS) 40 MCG/ACT AERS Place into the nose.  . Calcium-Magnesium-Vitamin D (CALCIUM 1200+D3 PO) Take 1 tablet by mouth daily.  . cetirizine (ZYRTEC) 10 MG tablet Take 10 mg by mouth daily.  . Cholecalciferol (VITAMIN D) 2000 UNITS tablet Take 2,000 Units by mouth daily.  . cycloSPORINE (RESTASIS) 0.05 % ophthalmic emulsion Place 1 drop into both eyes 2 (two) times daily.  Marland Kitchen dexlansoprazole (DEXILANT) 60 MG capsule Take 1 capsule (60 mg total) by mouth daily.  Marland Kitchen docusate sodium (COLACE) 100 MG capsule Take 100 mg by mouth 2 (two) times daily. PRN  . EPINEPHRINE 0.3 mg/0.3 mL IJ SOAJ injection USE AS DIRECTED  . furosemide (LASIX) 20 MG tablet TAKE ONE (1) TABLET EACH DAY  . gabapentin (NEURONTIN) 600 MG tablet Take 1 tablet (600 mg total) by mouth 4 (four) times daily.  Marland Kitchen ipratropium-albuterol (DUONEB) 0.5-2.5 (3) MG/3ML SOLN Take 3 mLs by nebulization every 4 (four) hours as needed.  Marland Kitchen L-Methylfolate (DEPLIN) 15 MG TABS Take by mouth.  . lidocaine (LIDODERM) 5 % Place 3 patches onto the skin daily. Remove & Discard patch within 12  hours or as directed by MD  . medroxyPROGESTERone (DEPO-PROVERA) 150 MG/ML injection Inject 150 mg into the muscle as directed. Every 2 months  . meloxicam (MOBIC) 15 MG tablet TAKE ONE (1) TABLET EACH DAY  . metoprolol succinate (TOPROL-XL) 25 MG 24 hr tablet TAKE ONE (1) TABLET EACH DAY  . montelukast (SINGULAIR) 10 MG tablet TAKE ONE TABLET DAILY AT BEDTIME  . Omega 3 1000 MG CAPS Take 1 capsule by mouth daily.  Marland Kitchen oxyCODONE-acetaminophen (PERCOCET) 7.5-325 MG tablet Take 1 tablet by mouth 5 (five) times daily as needed for severe pain. No More Than 5 a day  . predniSONE (DELTASONE) 5 MG tablet Take 1 tablet (5 mg total) by mouth every morning.  . promethazine (PHENERGAN) 12.5 MG tablet Take 1 tablet (12.5 mg total) by mouth every 8 (eight) hours as needed for nausea or vomiting.  . tamsulosin (FLOMAX) 0.4 MG CAPS capsule Take 0.4 mg by mouth daily.  . vitamin E (VITAMIN E) 400 UNIT capsule Take 400 Units by mouth daily.   No facility-administered encounter medications on file as of 07/10/2016.    * C/o red itchy burning rash bil mammory folds- started about 2 weeks ago.  Hyperlipidemia  This is a chronic problem. The current episode started more than 1 year ago. The problem is controlled. Recent lipid tests were reviewed and are normal. Factors aggravating her hyperlipidemia include fatty foods. Pertinent negatives include no  chest pain. Current antihyperlipidemic treatment includes statins. The current treatment provides no improvement of lipids. There are no compliance problems.  Risk factors for coronary artery disease include dyslipidemia and obesity.  Thyroid Problem  Presents for follow-up visit. Patient reports no fatigue or palpitations. The symptoms have been stable. The treatment provided significant relief. Her past medical history is significant for hyperlipidemia.  GERD Currently managed with prilosec at this time, no side effects IBS Currently managed well, denies any  complications at this visit Fibromyalgia States she is doing well, no recent flares of pain Depression Managing pretty well at this time, remains on Elavil with no side effects Lupus  States she has good days and bad days, but managing ok at this time. STill not 100% sure it is lupus but they are leaning towards this.  "ANA is elevated but lupus test is negative." Neurologist says taht may be MS but not sure about that either. Autoimmune disorder Not real sure what is wrong with her- has dx of lupus on chart ut as not actually ben diagnosed with lupus- is seeing many specialist.    Review of Systems  Constitutional: Negative for activity change, appetite change and fatigue.  HENT: Negative.   Respiratory: Negative.   Cardiovascular: Negative for chest pain and palpitations.  Gastrointestinal: Negative.   Endocrine: Negative.   Genitourinary: Negative.   Skin: Negative.   Neurological: Negative for dizziness and light-headedness.  Psychiatric/Behavioral: Negative.   All other systems reviewed and are negative.      Objective:   Physical Exam  Constitutional: She is oriented to person, place, and time. She appears well-developed and well-nourished.  HENT:  Right Ear: External ear normal.  Left Ear: External ear normal.  Nose: Nose normal.  Mouth/Throat: Oropharynx is clear and moist.  Eyes: Pupils are equal, round, and reactive to light.  Neck: Normal range of motion. Neck supple.  Cardiovascular: Normal rate, regular rhythm and normal heart sounds.   Pulmonary/Chest: Effort normal and breath sounds normal.  Abdominal: Soft. Bowel sounds are normal.  Neurological: She is alert and oriented to person, place, and time. She has normal reflexes. No cranial nerve deficit.  Skin: Skin is warm and dry.  Erythematous moist patchy area on right breast  Psychiatric: She has a normal mood and affect. Her behavior is normal. Judgment and thought content normal.    BP 127/78   Pulse  91   Temp 97.5 F (36.4 C) (Oral)   Ht 5\' 8"  (1.727 m)   Wt 221 lb (100.2 kg)   BMI 33.60 kg/m       Assessment & Plan:   1. Tachycardia, paroxysmal (HCC)   2. Dysphagia   3. Gastroesophageal reflux disease, esophagitis presence not specified   4. Gastroesophageal reflux disease without esophagitis   5. IBS (irritable bowel syndrome)   6. Thyroid disease   7. Abdominal pain, generalized   8. Depression   9. Hyperlipidemia   10. Lupus (systemic lupus erythematosus) (HCC)   11. Seizures (HCC)   12. 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 Peripheral edema   15. Cutaneous yeast- bil mammory folds  Rest Exercise when can Continue all meds Keep follow up appointments with specialists RTO prn  Meds ordered this encounter  Medications  . nystatin-triamcinolone ointment (MYCOLOG)    Sig: Apply 1 application topically 2 (two) times daily.    Dispense:  30 g    Refill:  0    Order Specific Question:  Supervising Provider    Answer:   Johna Sheriff [[id number]]  . lidocaine (LIDODERM) 5 %    Sig: Place 3 patches onto the skin daily. Remove & Discard patch within 12 hours or as directed by MD    Dispense:  90 patch    Refill:  4    Order Specific Question:   Supervising Provider    Answer:   VINCENT, CAROL L [[id number]]  . dexlansoprazole (DEXILANT) 60 MG capsule    Sig: Take 1 capsule (60 mg total) by mouth daily.    Dispense:  30 capsule    Refill:  5    Order Specific Question:   Supervising Provider    Answer:   VINCENT, CAROL L [[id number]]  . atorvastatin (LIPITOR) 40 MG tablet    Sig: TAKE ONE (1) TABLET EACH DAY    Dispense:  90 tablet    Refill:  1    Order Specific Question:   Supervising Provider    Answer:   VINCENT, CAROL L [[id number]]  . amitriptyline (ELAVIL) 75 MG tablet    Sig: Take 1 tablet (75 mg total) by mouth at bedtime.    Dispense:  30 tablet    Refill:  4    Order Specific Question:   Supervising Provider    Answer:   VINCENT, CAROL L  [[id number]]  . baclofen (LIORESAL) 20 MG tablet    Sig: Take 1 tablet (20 mg total) by mouth 4 (four) times daily.    Dispense:  120 each    Refill:  3    Order Specific Question:   Supervising Provider    Answer:   VINCENT, CAROL L [[id number]]  . gabapentin (NEURONTIN) 600 MG tablet    Sig: Take 1 tablet (600 mg total) by mouth 4 (four) times daily.    Dispense:  120 tablet    Refill:  3    Order Specific Question:   Supervising Provider    Answer:   VINCENT, CAROL L [[id number]]  . furosemide (LASIX) 20 MG tablet    Sig: TAKE ONE (1) TABLET EACH DAY    Dispense:  30 tablet    Refill:  5    Order Specific Question:   Supervising Provider    Answer:   VINCENT, CAROL L [[id number]]  . acetaZOLAMIDE (DIAMOX) 250 MG tablet    Sig: Take 1 tablet (250 mg total) by mouth 3 (three) times daily. Take 250 mg two times a day; then 500 mg at bedtime    Dispense:  90 tablet    Refill:  5    Order Specific Question:   Supervising Provider    Answer:   VINCENT, CAROL L [[id number]]  . metoprolol succinate (TOPROL-XL) 25 MG 24 hr tablet    Sig: TAKE ONE (1) TABLET EACH DAY    Dispense:  90 tablet    Refill:  1    Order Specific Question:   Supervising Provider    Answer:   VINCENT, CAROL L [[id number]]  . tamsulosin (FLOMAX) 0.4 MG CAPS capsule    Sig: Take 1 capsule (0.4 mg total) by mouth daily.    Dispense:  30 capsule    Refill:  5    Order Specific Question:   Supervising Provider    Answer:   Oswaldo Done, CAROL L [[id number]]   Orders Placed This Encounter  Procedures  . CMP14+EGFR  . Lipid panel     Mary-Margaret Daphine Deutscher, FNP

## 2016-07-11 ENCOUNTER — Other Ambulatory Visit: Payer: Self-pay

## 2016-07-11 ENCOUNTER — Encounter (HOSPITAL_BASED_OUTPATIENT_CLINIC_OR_DEPARTMENT_OTHER): Payer: BLUE CROSS/BLUE SHIELD | Admitting: Physical Medicine & Rehabilitation

## 2016-07-11 ENCOUNTER — Encounter: Payer: Self-pay | Admitting: Physical Medicine & Rehabilitation

## 2016-07-11 VITALS — BP 122/79 | HR 84

## 2016-07-11 DIAGNOSIS — R569 Unspecified convulsions: Secondary | ICD-10-CM | POA: Diagnosis not present

## 2016-07-11 DIAGNOSIS — M6249 Contracture of muscle, multiple sites: Secondary | ICD-10-CM

## 2016-07-11 DIAGNOSIS — M545 Low back pain: Secondary | ICD-10-CM

## 2016-07-11 DIAGNOSIS — M5416 Radiculopathy, lumbar region: Secondary | ICD-10-CM | POA: Diagnosis not present

## 2016-07-11 DIAGNOSIS — M797 Fibromyalgia: Secondary | ICD-10-CM

## 2016-07-11 DIAGNOSIS — M62838 Other muscle spasm: Secondary | ICD-10-CM

## 2016-07-11 DIAGNOSIS — G8929 Other chronic pain: Secondary | ICD-10-CM

## 2016-07-11 DIAGNOSIS — M5417 Radiculopathy, lumbosacral region: Secondary | ICD-10-CM

## 2016-07-11 DIAGNOSIS — R739 Hyperglycemia, unspecified: Secondary | ICD-10-CM

## 2016-07-11 LAB — CMP14+EGFR
ALT: 14 IU/L (ref 0–32)
AST: 13 IU/L (ref 0–40)
Albumin/Globulin Ratio: 1.7 (ref 1.2–2.2)
Albumin: 4.3 g/dL (ref 3.5–5.5)
Alkaline Phosphatase: 107 IU/L (ref 39–117)
BUN/Creatinine Ratio: 10 (ref 9–23)
BUN: 7 mg/dL (ref 6–20)
Bilirubin Total: 0.3 mg/dL (ref 0.0–1.2)
CO2: 22 mmol/L (ref 18–29)
Calcium: 9.2 mg/dL (ref 8.7–10.2)
Chloride: 109 mmol/L — ABNORMAL HIGH (ref 96–106)
Creatinine, Ser: 0.69 mg/dL (ref 0.57–1.00)
GFR calc Af Amer: 133 mL/min/1.73
GFR calc non Af Amer: 116 mL/min/1.73
Globulin, Total: 2.5 g/dL (ref 1.5–4.5)
Glucose: 113 mg/dL — ABNORMAL HIGH (ref 65–99)
Potassium: 3.6 mmol/L (ref 3.5–5.2)
Sodium: 146 mmol/L — ABNORMAL HIGH (ref 134–144)
Total Protein: 6.8 g/dL (ref 6.0–8.5)

## 2016-07-11 LAB — LIPID PANEL
CHOLESTEROL TOTAL: 205 mg/dL — AB (ref 100–199)
Chol/HDL Ratio: 3.9 ratio units (ref 0.0–4.4)
HDL: 52 mg/dL (ref >39)
LDL CALC: 95 mg/dL (ref 0–99)
TRIGLYCERIDES: 291 mg/dL — AB (ref 0–149)
VLDL CHOLESTEROL CAL: 58 mg/dL — AB (ref 5–40)

## 2016-07-11 LAB — BAYER DCA HB A1C WAIVED: HB A1C (BAYER DCA - WAIVED): 5.8 %

## 2016-07-11 MED ORDER — OXYCODONE-ACETAMINOPHEN 7.5-325 MG PO TABS: 1.0000 | ORAL_TABLET | Freq: Every day | ORAL | 0 refills | Status: DC | PRN

## 2016-07-11 NOTE — Progress Notes (Signed)
Percocet 7.5mg  5/day refilled.

## 2016-07-11 NOTE — Progress Notes (Signed)
Patient ID: Autumn Davis, female   DOB: [birth year], 30 y.o.   MRN: [id number]   Chief Complaint: Lumbar Radiculopathy, Low Back Pain M54.16 M54.5 Bio-wave Treatment Medical History: Low Back pain/ Lumbar radiculopathy/ Fibromyalgia Syndrome/ Auto Immune Syndrome.  She had some relief with TENS but the relief was short term. Last treatment: 07/09/16  Procedure Performed: Procedure Performed: Percutaneous Electrical Nerve Stimulation ( PENS) # 8. BP 121/85   Pulse 83   SpO2 97%   Pain Score: 5  Informed Consent:Discussedalternative treatment options, possible risks, potential complications,andtreatment limitations.  Position: The patient was seated with torso at 90 degrees to the legs.  Description: The patient was taken to the exam room where the diagnostic implantation neurostimulation device through placement of needle array electrodes were percutaneously placed after identifying the point of maximal tenderness at lateral mid thoracic muscles. The area was cleaned with an alcohol swab and then allowed to air dry. The needle array was removed from the packing and placed over the marked areas. Both thumbs were used to apply perpendicular pressure at greater than 10 pounds of pressure per square inch over the needle array penetrating each through the dermal layer into the subcutaneous layer. The patient was positioned seated upright and instructed on the use of the Biowave device so that the intensity could be controlled through the session. The device was activated to deliver the electrical signals through the skin into the deep tissue at the pain site. The treatment duration lasted 30 minutes,during which time someone stayed with the pt for the duration of treatment. The pt started at 22% intensity and reached 27 % intensity. At the completion of the treatment the electrodes were removed and placed in the sharps disposal container. The area was cleaned with an alcohol swab.  Nobleedingnoted. The patient tolerated the procedure well with no complications. Pre-treatment pain level was 5/10. The patient reports the pain level post treatment was 2/10. This is her last treatment. Improvement with ROM/Stretching.    Medication List       Accurate as of 07/11/16 10:26 AM. Always use your most recent med list.          acetaZOLAMIDE 250 MG tablet Commonly known as:  DIAMOX Take 1 tablet (250 mg total) by mouth 3 (three) times daily. Take 250 mg two times a day; then 500 mg at bedtime   albuterol 108 (90 Base) MCG/ACT inhaler Commonly known as:  PROVENTIL HFA;VENTOLIN HFA Inhale 2 puffs into the lungs every 6 (six) hours as needed for wheezing or shortness of breath.   amitriptyline 75 MG tablet Commonly known as:  ELAVIL Take 1 tablet (75 mg total) by mouth at bedtime.   atorvastatin 40 MG tablet Commonly known as:  LIPITOR TAKE ONE (1) TABLET EACH DAY   baclofen 20 MG tablet Commonly known as:  LIORESAL Take 1 tablet (20 mg total) by mouth 4 (four) times daily.   CALCIUM 1200+D3 PO Take 1 tablet by mouth daily.   cetirizine 10 MG tablet Commonly known as:  ZYRTEC Take 10 mg by mouth daily.   cycloSPORINE 0.05 % ophthalmic emulsion Commonly known as:  RESTASIS Place 1 drop into both eyes 2 (two) times daily.   DEPLIN 15 MG Tabs Take by mouth.   dexlansoprazole 60 MG capsule Commonly known as:  DEXILANT Take 1 capsule (60 mg total) by mouth daily.   docusate sodium 100 MG capsule Commonly known as:  COLACE Take 100 mg by mouth 2 (two) times daily. PRN  EPINEPHrine 0.3 mg/0.3 mL Soaj injection Commonly known as:  EPI-PEN USE AS DIRECTED   furosemide 20 MG tablet Commonly known as:  LASIX TAKE ONE (1) TABLET EACH DAY   gabapentin 600 MG tablet Commonly known as:  NEURONTIN Take 1 tablet (600 mg total) by mouth 4 (four) times daily.   ipratropium-albuterol 0.5-2.5 (3) MG/3ML Soln Commonly known as:  DUONEB Take 3 mLs by  nebulization every 4 (four) hours as needed.   lidocaine 5 % Commonly known as:  LIDODERM Place 3 patches onto the skin daily. Remove & Discard patch within 12 hours or as directed by MD   medroxyPROGESTERone 150 MG/ML injection Commonly known as:  DEPO-PROVERA Inject 150 mg into the muscle as directed. Every 2 months   meloxicam 15 MG tablet Commonly known as:  MOBIC TAKE ONE (1) TABLET EACH DAY   metoprolol succinate 25 MG 24 hr tablet Commonly known as:  TOPROL-XL TAKE ONE (1) TABLET EACH DAY   montelukast 10 MG tablet Commonly known as:  SINGULAIR TAKE ONE TABLET DAILY AT BEDTIME   nystatin-triamcinolone ointment Commonly known as:  MYCOLOG Apply 1 application topically 2 (two) times daily.   Omega 3 1000 MG Caps Take 1 capsule by mouth daily.   oxyCODONE-acetaminophen 7.5-325 MG tablet Commonly known as:  PERCOCET Take 1 tablet by mouth 5 (five) times daily as needed for severe pain. No More Than 5 a day   predniSONE 5 MG tablet Commonly known as:  DELTASONE Take 1 tablet (5 mg total) by mouth every morning.   promethazine 12.5 MG tablet Commonly known as:  PHENERGAN Take 1 tablet (12.5 mg total) by mouth every 8 (eight) hours as needed for nausea or vomiting.   QNASL CHILDRENS 40 MCG/ACT Aers Generic drug:  Beclomethasone Dipropionate Place into the nose.   tamsulosin 0.4 MG Caps capsule Commonly known as:  FLOMAX Take 1 capsule (0.4 mg total) by mouth daily.   Vitamin D 2000 units tablet Take 2,000 Units by mouth daily.   vitamin E 400 UNIT capsule Generic drug:  vitamin E Take 400 Units by mouth daily.      Post Vitals: BP: 122/79 P: 84 Sat's: 95%

## 2016-07-11 NOTE — Addendum Note (Signed)
Addended by: Margorie JohnJOHNSON, Shaquala Broeker M on: 07/11/2016 11:53 AM   Modules accepted: Orders

## 2016-07-15 ENCOUNTER — Telehealth: Payer: Self-pay

## 2016-07-15 ENCOUNTER — Ambulatory Visit: Payer: BLUE CROSS/BLUE SHIELD | Admitting: Registered Nurse

## 2016-07-15 NOTE — Telephone Encounter (Signed)
What is patient using them for

## 2016-07-16 ENCOUNTER — Encounter: Payer: BLUE CROSS/BLUE SHIELD | Admitting: Physical Medicine & Rehabilitation

## 2016-07-16 NOTE — Telephone Encounter (Signed)
Patient states she was using the patches for her ribs. Patient also states that she does not need it filled because she has refills and will contact the pain clinic when she needs them filled because they are who fills them.

## 2016-07-17 ENCOUNTER — Ambulatory Visit: Payer: BLUE CROSS/BLUE SHIELD | Admitting: Registered Nurse

## 2016-07-24 DIAGNOSIS — Z6832 Body mass index (BMI) 32.0-32.9, adult: Secondary | ICD-10-CM | POA: Diagnosis not present

## 2016-07-24 DIAGNOSIS — Z01419 Encounter for gynecological examination (general) (routine) without abnormal findings: Secondary | ICD-10-CM | POA: Diagnosis not present

## 2016-07-24 DIAGNOSIS — Z13820 Encounter for screening for osteoporosis: Secondary | ICD-10-CM | POA: Diagnosis not present

## 2016-07-28 ENCOUNTER — Other Ambulatory Visit: Payer: Self-pay | Admitting: Nurse Practitioner

## 2016-08-07 ENCOUNTER — Other Ambulatory Visit: Payer: Self-pay | Admitting: Nurse Practitioner

## 2016-08-08 DIAGNOSIS — R3915 Urgency of urination: Secondary | ICD-10-CM | POA: Diagnosis not present

## 2016-08-08 DIAGNOSIS — N3946 Mixed incontinence: Secondary | ICD-10-CM | POA: Diagnosis not present

## 2016-08-13 ENCOUNTER — Ambulatory Visit: Payer: BLUE CROSS/BLUE SHIELD | Admitting: Physical Medicine & Rehabilitation

## 2016-08-19 ENCOUNTER — Encounter: Payer: BLUE CROSS/BLUE SHIELD | Admitting: Physical Medicine & Rehabilitation

## 2016-08-22 ENCOUNTER — Encounter: Payer: BLUE CROSS/BLUE SHIELD | Attending: Physical Medicine & Rehabilitation | Admitting: Registered Nurse

## 2016-08-22 ENCOUNTER — Encounter: Payer: Self-pay | Admitting: Registered Nurse

## 2016-08-22 VITALS — BP 114/82 | HR 82

## 2016-08-22 DIAGNOSIS — R0781 Pleurodynia: Secondary | ICD-10-CM | POA: Diagnosis not present

## 2016-08-22 DIAGNOSIS — Z5181 Encounter for therapeutic drug level monitoring: Secondary | ICD-10-CM | POA: Diagnosis not present

## 2016-08-22 DIAGNOSIS — G894 Chronic pain syndrome: Secondary | ICD-10-CM

## 2016-08-22 DIAGNOSIS — M62838 Other muscle spasm: Secondary | ICD-10-CM | POA: Diagnosis not present

## 2016-08-22 DIAGNOSIS — M329 Systemic lupus erythematosus, unspecified: Secondary | ICD-10-CM | POA: Insufficient documentation

## 2016-08-22 DIAGNOSIS — R569 Unspecified convulsions: Secondary | ICD-10-CM | POA: Diagnosis not present

## 2016-08-22 DIAGNOSIS — M797 Fibromyalgia: Secondary | ICD-10-CM

## 2016-08-22 DIAGNOSIS — M6249 Contracture of muscle, multiple sites: Secondary | ICD-10-CM | POA: Insufficient documentation

## 2016-08-22 DIAGNOSIS — Z79899 Other long term (current) drug therapy: Secondary | ICD-10-CM

## 2016-08-22 DIAGNOSIS — M545 Low back pain, unspecified: Secondary | ICD-10-CM

## 2016-08-22 MED ORDER — OXYCODONE-ACETAMINOPHEN 7.5-325 MG PO TABS: 1.0000 | ORAL_TABLET | Freq: Every day | ORAL | 0 refills | Status: DC | PRN

## 2016-08-22 NOTE — Progress Notes (Signed)
Subjective:    Patient ID: Autumn Davis, female    DOB: 02/01/1986, 30 y.o.   MRN: [id number]  HPI:  [name] is a 30 year old female who returns for follow up appointment for chronic pain and medication refill. She states her pain is located mainly on her left ribs and lower back.She rates her pain 5.  Her current exercise regime is performing stretching exercises and walking. Also states last Saturday 08/18/2016 she was getting a quilt out of the closet when she went to step she fell and landed on her left side. She picked herself up slowly and didn't seek medical attention.  [name] has been diagnosed with Osteoporosis PCP and Rheumatology following.   Her mother in room all questions answered. Pain Inventory Average Pain 7 Pain Right Now 5 My pain is constant, sharp and stabbing  In the last 24 hours, has pain interfered with the following? General activity 6 Relation with others 6 Enjoyment of life 6 What TIME of day is your pain at its worst? evening, night Sleep (in general) Poor  Pain is worse with: walking, bending, sitting, inactivity, standing and some activites Pain improves with: rest, heat/ice, therapy/exercise, pacing activities, medication and TENS Relief from Meds: 5  Mobility walk without assistance walk with assistance ability to climb steps?  yes do you drive?  no  Function I need assistance with the following:  meal prep, household duties and shopping  Neuro/Psych bladder control problems weakness numbness tremor tingling trouble walking spasms dizziness  Prior Studies Any changes since last visit?  yes  Physicians involved in your care Any changes since last visit?  no   Family History  Problem Relation Age of Onset  . Thyroid disease Mother   . Colon polyps Father   . Lung cancer Maternal Grandmother   . Cancer Paternal Grandmother     colon/pancreatiec/lymphoma  . Irritable bowel syndrome Brother    Social  History   Social History  . Marital status: Married    Spouse name: N/A  . Number of children: N/A  . Years of education: N/A   Occupational History  . disabled [employer]   Social History Main Topics  . Smoking status: Never Smoker  . Smokeless tobacco: Never Used  . Alcohol use No  . Drug use: No  . Sexual activity: Not Asked   Other Topics Concern  . None   Social History Narrative  . None   Past Surgical History:  Procedure Laterality Date  . 24 HOUR PH STUDY N/A 10/29/2015   Procedure: 24 HOUR PH STUDY;  Surgeon: Iva Boop, MD;  Location: [facility];  Service: Endoscopy;  Laterality: N/A;  . COLONOSCOPY    . ESOPHAGEAL MANOMETRY N/A 10/29/2015   Procedure: ESOPHAGEAL MANOMETRY (EM);  Surgeon: Iva Boop, MD;  Location: [facility];  Service: Endoscopy;  Laterality: N/A;  . KNEE SURGERY  2002/2003   bil  . RECTAL SURGERY  correction of prolapse   2010   Past Medical History:  Diagnosis Date  . Allergy   . Arthritis    HANDS,HIPS,KNEES  . Bronchitis, chronic/intermittent 01/22/2012  . Depression 01/22/2012  . GERD (gastroesophageal reflux disease)   . Hyperlipidemia   . IBS (irritable bowel syndrome) 01/22/2012  . Lupus   . Neuromuscular disorder (HCC)   . Osteoporosis   . Seizures (HCC)   . SOB (shortness of breath)   . Thyroid disease    BP 114/82  Pulse 82   SpO2 97%   Opioid Risk Score:   Fall Risk Score:  `1  Depression screen PHQ 2/9  Depression screen [facility] 2/9 07/10/2016 04/17/2016 04/08/2016 01/10/2016 06/12/2015 03/29/2015 02/16/2015  Decreased Interest 0 0 1 1 1 1 1   Down, Depressed, Hopeless 0 0 0 0 0 0 0  PHQ - 2 Score 0 0 1 1 1 1 1   Altered sleeping - - - - - - 3  Tired, decreased energy - - - - - - 1  Change in appetite - - - - - - 1  Feeling bad or failure about yourself  - - - - - - 0  Trouble concentrating - - - - - - 1  Moving slowly or fidgety/restless - - - - - - 0  Suicidal thoughts - - - - - - 0  PHQ-9 Score  - - - - - - 7     Review of Systems  HENT: Negative.   Eyes: Negative.   Respiratory: Negative.   Cardiovascular: Negative.   Gastrointestinal: Negative.   Endocrine: Negative.   Genitourinary: Negative.   Musculoskeletal: Positive for back pain.  Skin: Negative.   Allergic/Immunologic: Negative.   Neurological: Positive for dizziness, weakness and numbness.  Hematological: Negative.   Psychiatric/Behavioral: Negative.        Objective:   Physical Exam  Constitutional: She is oriented to person, place, and time. She appears well-developed and well-nourished.  HENT:  Head: Normocephalic and atraumatic.  Neck: Normal range of motion. Neck supple.  Cardiovascular: Normal rate and regular rhythm.   Pulmonary/Chest: Effort normal and breath sounds normal.  Musculoskeletal:  Normal Muscle Bulk and Muscle Testing Reveals: Upper Extremities: Full ROM and Muscle Strength 5/5 Thoracic Paraspinal Tenderness: T-7- T-9 mainly left side Lower Extremities: Full ROM and Muscle Strength 5/5 Arises from table with ease Narrow based gait  Neurological: She is alert and oriented to person, place, and time.  Skin: Skin is warm and dry.  Psychiatric: She has a normal mood and affect.  Nursing note and vitals reviewed.         Assessment & Plan:  1. Chronic seizure disorder: No seizure's. Continue Gabapentin. Neurology Following  2. Chronic muscle spasms, weakness with associated pain disorder: Continue Baclofen and MOBIC. Continue with Exercise regime. 3. Chronic dysphagia: GI Following  4. Anxiety with depression : Stable. Continue Elavil  5. Fibromyalgia/ Chronic Pain: Continue with exercise and heat Therapy.  Refilled: oxyCODONE 7.5/325mg  one tablet 5 times daily as needed #150.  We will continue opioid monitoring program, this consists of regular clinic visits, examinations, urine drug screen, pill counts as well as use of [place] Controlled Substance Reporting  System. 6. Peripheral Neuropathy: Continue Gabapentin  20 minutes of face to face patient care time was spent during this visit. All questions were encouraged and answered

## 2016-08-30 LAB — TOXASSURE SELECT,+ANTIDEPR,UR

## 2016-09-01 NOTE — Progress Notes (Signed)
Urine drug screen for this encounter is consistent for prescribed medication 

## 2016-09-12 ENCOUNTER — Ambulatory Visit (INDEPENDENT_AMBULATORY_CARE_PROVIDER_SITE_OTHER): Payer: BLUE CROSS/BLUE SHIELD

## 2016-09-12 DIAGNOSIS — Z23 Encounter for immunization: Secondary | ICD-10-CM | POA: Diagnosis not present

## 2016-09-17 ENCOUNTER — Other Ambulatory Visit: Payer: Self-pay | Admitting: Nurse Practitioner

## 2016-09-18 ENCOUNTER — Encounter: Payer: Self-pay | Admitting: Registered Nurse

## 2016-09-18 ENCOUNTER — Encounter: Payer: BLUE CROSS/BLUE SHIELD | Attending: Physical Medicine & Rehabilitation | Admitting: Registered Nurse

## 2016-09-18 VITALS — BP 123/85 | HR 91 | Resp 14

## 2016-09-18 DIAGNOSIS — M6249 Contracture of muscle, multiple sites: Secondary | ICD-10-CM | POA: Insufficient documentation

## 2016-09-18 DIAGNOSIS — M329 Systemic lupus erythematosus, unspecified: Secondary | ICD-10-CM | POA: Diagnosis not present

## 2016-09-18 DIAGNOSIS — Z5181 Encounter for therapeutic drug level monitoring: Secondary | ICD-10-CM

## 2016-09-18 DIAGNOSIS — M62838 Other muscle spasm: Secondary | ICD-10-CM | POA: Diagnosis not present

## 2016-09-18 DIAGNOSIS — R569 Unspecified convulsions: Secondary | ICD-10-CM

## 2016-09-18 DIAGNOSIS — Z79899 Other long term (current) drug therapy: Secondary | ICD-10-CM

## 2016-09-18 DIAGNOSIS — G894 Chronic pain syndrome: Secondary | ICD-10-CM

## 2016-09-18 DIAGNOSIS — R0781 Pleurodynia: Secondary | ICD-10-CM | POA: Diagnosis not present

## 2016-09-18 DIAGNOSIS — M797 Fibromyalgia: Secondary | ICD-10-CM | POA: Diagnosis not present

## 2016-09-18 DIAGNOSIS — M545 Low back pain: Secondary | ICD-10-CM | POA: Insufficient documentation

## 2016-09-18 MED ORDER — OXYCODONE-ACETAMINOPHEN 7.5-325 MG PO TABS: 1.0000 | ORAL_TABLET | Freq: Every day | ORAL | 0 refills | Status: DC | PRN

## 2016-09-18 NOTE — Progress Notes (Signed)
Subjective:    Patient ID: Autumn Davis, female    DOB: 1986-01-22, 30 y.o.   MRN: [id number]  HPI: Autumn Davis is a 30 year old female who returns for follow up appointment for chronic pain and medication refill. She states her pain is located mainly in her bilateral ribs.She rates her pain 6. Her current exercise regime is performing stretching exercises and walking.  Autumn Davis has been diagnosed with Osteoporosis PCP and Rheumatology following.   Pain Inventory Average Pain 8 Pain Right Now 6 My pain is constant, sharp, burning, dull, stabbing, tingling and aching  In the last 24 hours, has pain interfered with the following? General activity 7 Relation with others 4 Enjoyment of life 4 What TIME of day is your pain at its worst? evening, night Sleep (in general) Poor  Pain is worse with: walking, bending, sitting, standing and some activites Pain improves with: rest, heat/ice, therapy/exercise, pacing activities, medication and TENS Relief from Meds: 3  Mobility walk without assistance walk with assistance use a cane ability to climb steps?  yes do you drive?  no use a wheelchair transfers alone  Function disabled: date disabled 2012 I need assistance with the following:  meal prep, household duties and shopping  Neuro/Psych bladder control problems bowel control problems weakness numbness tremor tingling trouble walking spasms dizziness  Prior Studies Any changes since last visit?  no  Physicians involved in your care Any changes since last visit?  no   Family History  Problem Relation Age of Onset  . Thyroid disease Mother   . Colon polyps Father   . Lung cancer Maternal Grandmother   . Cancer Paternal Grandmother     colon/pancreatiec/lymphoma  . Irritable bowel syndrome Brother    Social History   Social History  . Marital status: Married    Spouse name: N/A  . Number of children: N/A  . Years of education: N/A    Occupational History  . disabled [employer]   Social History Main Topics  . Smoking status: Never Smoker  . Smokeless tobacco: Never Used  . Alcohol use No  . Drug use: No  . Sexual activity: Not Asked   Other Topics Concern  . None   Social History Narrative  . None   Past Surgical History:  Procedure Laterality Date  . 24 HOUR PH STUDY N/A 10/29/2015   Procedure: 24 HOUR PH STUDY;  Surgeon: Iva Boop, MD;  Location: [facility];  Service: Endoscopy;  Laterality: N/A;  . COLONOSCOPY    . ESOPHAGEAL MANOMETRY N/A 10/29/2015   Procedure: ESOPHAGEAL MANOMETRY (EM);  Surgeon: Iva Boop, MD;  Location: [facility];  Service: Endoscopy;  Laterality: N/A;  . KNEE SURGERY  2002/2003   bil  . RECTAL SURGERY  correction of prolapse   2010   Past Medical History:  Diagnosis Date  . Allergy   . Arthritis    HANDS,HIPS,KNEES  . Bronchitis, chronic/intermittent 01/22/2012  . Depression 01/22/2012  . GERD (gastroesophageal reflux disease)   . Hyperlipidemia   . IBS (irritable bowel syndrome) 01/22/2012  . Lupus   . Neuromuscular disorder (HCC)   . Osteoporosis   . Seizures (HCC)   . SOB (shortness of breath)   . Thyroid disease    BP 123/85   Pulse 91   Resp 14   SpO2 97%   Opioid Risk Score:   Fall Risk Score:  `1  Depression screen PHQ 2/9  Depression screen  [facility] 2/9 07/10/2016 04/17/2016 04/08/2016 01/10/2016 06/12/2015 03/29/2015 02/16/2015  Decreased Interest 0 0 1 1 1 1 1   Down, Depressed, Hopeless 0 0 0 0 0 0 0  PHQ - 2 Score 0 0 1 1 1 1 1   Altered sleeping - - - - - - 3  Tired, decreased energy - - - - - - 1  Change in appetite - - - - - - 1  Feeling bad or failure about yourself  - - - - - - 0  Trouble concentrating - - - - - - 1  Moving slowly or fidgety/restless - - - - - - 0  Suicidal thoughts - - - - - - 0  PHQ-9 Score - - - - - - 7    Review of Systems  All other systems reviewed and are negative.      Objective:   Physical  Exam  Constitutional: She is oriented to person, place, and time. She appears well-developed and well-nourished.  HENT:  Head: Normocephalic and atraumatic.  Neck: Normal range of motion. Neck supple.  Cardiovascular: Normal rate and regular rhythm.   Pulmonary/Chest: Effort normal and breath sounds normal.  Musculoskeletal:  Normal Muscle Bulk and Muscle Testing Reveals: Upper Extremities: Full ROM and Muscle Strength 5/5 Thoracic Paraspinal Tenderness: T-7- T-9 Lower Extremities: Full ROM and Muscle Strength 5/5 Arises from table with ease Narrow Based Gait  Neurological: She is alert and oriented to person, place, and time.  Skin: Skin is warm and dry.  Psychiatric: She has a normal mood and affect.  Nursing note and vitals reviewed.         Assessment & Plan:  1. Chronic seizure disorder: No seizure's. Continue Gabapentin. Neurology Following  2. Chronic muscle spasms, weakness with associated pain disorder: Continue Baclofen and MOBIC. Continue with Exercise regime. 3. Chronic dysphagia: GI Following  4. Anxiety with depression : Stable. Continue Elavil  5. Fibromyalgia/ Chronic Pain: Continue with exercise and heat Therapy.  Refilled: oxyCODONE 7.5/325mg  onetablet 5 times daily as needed #150.  We will continue opioid monitoring program, this consists of regular clinic visits, examinations, urine drug screen, pill counts as well as use of [place] Controlled Substance Reporting System. 6. Peripheral Neuropathy: Continue Gabapentin  20 minutes of face to face patient care time was spent during this visit. All questions were encouraged and answered

## 2016-10-07 ENCOUNTER — Telehealth: Payer: Self-pay

## 2016-10-07 NOTE — Telephone Encounter (Signed)
What I have never heard of this- it is not in epocrates- [place]?

## 2016-10-13 ENCOUNTER — Ambulatory Visit: Payer: BLUE CROSS/BLUE SHIELD | Admitting: Nurse Practitioner

## 2016-10-20 ENCOUNTER — Other Ambulatory Visit: Payer: Self-pay | Admitting: Nurse Practitioner

## 2016-10-20 DIAGNOSIS — R609 Edema, unspecified: Secondary | ICD-10-CM

## 2016-10-21 ENCOUNTER — Encounter: Payer: Self-pay | Admitting: Physical Medicine & Rehabilitation

## 2016-10-21 ENCOUNTER — Encounter
Payer: BLUE CROSS/BLUE SHIELD | Attending: Physical Medicine & Rehabilitation | Admitting: Physical Medicine & Rehabilitation

## 2016-10-21 VITALS — BP 130/88 | HR 92 | Resp 14

## 2016-10-21 DIAGNOSIS — M62838 Other muscle spasm: Secondary | ICD-10-CM

## 2016-10-21 DIAGNOSIS — R569 Unspecified convulsions: Secondary | ICD-10-CM | POA: Diagnosis not present

## 2016-10-21 DIAGNOSIS — M329 Systemic lupus erythematosus, unspecified: Secondary | ICD-10-CM | POA: Diagnosis not present

## 2016-10-21 DIAGNOSIS — M545 Low back pain: Secondary | ICD-10-CM | POA: Insufficient documentation

## 2016-10-21 DIAGNOSIS — M797 Fibromyalgia: Secondary | ICD-10-CM

## 2016-10-21 DIAGNOSIS — M6249 Contracture of muscle, multiple sites: Secondary | ICD-10-CM | POA: Diagnosis not present

## 2016-10-21 MED ORDER — OXYCODONE-ACETAMINOPHEN 7.5-325 MG PO TABS: 1.0000 | ORAL_TABLET | Freq: Every day | ORAL | 0 refills | Status: DC | PRN

## 2016-10-21 NOTE — Progress Notes (Signed)
Subjective:    Patient ID: Autumn Davis, female    DOB: 03/30/1986, 30 y.o.   MRN: [id number]  HPI   Autumn Davis is back regarding her chronic pain. Her OB ordered a bone scan as her height had decreased. She was found to have had osteoporosis in the lumbar spine and osteopenia the femoral necks. They are looking at options for treatment. They are considering removal of her ovaries.   She has noticed more tingling in her legs with associated numbness. This had gone away for some time prior to recurrence a couple months ago.   She went through a course of Biowave in August. She felt that the biowave was helpful initially, but by the end she felt that she was more sensitive to the procedure. The results may have been skewed by a kidney stone she developed. She did not experience any long term relief.     Pain Inventory Average Pain 7 Pain Right Now 4 My pain is constant, sharp, burning, dull, stabbing and tingling  In the last 24 hours, has pain interfered with the following? General activity 7 Relation with others 6 Enjoyment of life 4 What TIME of day is your pain at its worst? evening, night Sleep (in general) Poor  Pain is worse with: walking, bending, sitting, inactivity, standing and some activites Pain improves with: rest, heat/ice, therapy/exercise, pacing activities, medication and TENS Relief from Meds: 4  Mobility walk without assistance walk with assistance ability to climb steps?  yes do you drive?  no  Function disabled: date disabled n/a I need assistance with the following:  meal prep, household duties and shopping  Neuro/Psych bladder control problems bowel control problems weakness numbness tremor tingling trouble walking spasms dizziness  Prior Studies Any changes since last visit?  no  Physicians involved in your care Any changes since last visit?  no   Family History  Problem Relation Age of Onset  . Thyroid disease Mother   . Colon polyps  Father   . Lung cancer Maternal Grandmother   . Cancer Paternal Grandmother     colon/pancreatiec/lymphoma  . Irritable bowel syndrome Brother    Social History   Social History  . Marital status: Married    Spouse name: N/A  . Number of children: N/A  . Years of education: N/A   Occupational History  . disabled [employer]   Social History Main Topics  . Smoking status: Never Smoker  . Smokeless tobacco: Never Used  . Alcohol use No  . Drug use: No  . Sexual activity: Not Asked   Other Topics Concern  . None   Social History Narrative  . None   Past Surgical History:  Procedure Laterality Date  . 24 HOUR PH STUDY N/A 10/29/2015   Procedure: 24 HOUR PH STUDY;  Surgeon: Iva Boop, MD;  Location: [facility];  Service: Endoscopy;  Laterality: N/A;  . COLONOSCOPY    . ESOPHAGEAL MANOMETRY N/A 10/29/2015   Procedure: ESOPHAGEAL MANOMETRY (EM);  Surgeon: Iva Boop, MD;  Location: [facility];  Service: Endoscopy;  Laterality: N/A;  . KNEE SURGERY  2002/2003   bil  . RECTAL SURGERY  correction of prolapse   2010   Past Medical History:  Diagnosis Date  . Allergy   . Arthritis    HANDS,HIPS,KNEES  . Bronchitis, chronic/intermittent 01/22/2012  . Depression 01/22/2012  . GERD (gastroesophageal reflux disease)   . Hyperlipidemia   . IBS (irritable bowel syndrome) 01/22/2012  . Lupus   .  Neuromuscular disorder (HCC)   . Osteoporosis   . Seizures (HCC)   . SOB (shortness of breath)   . Thyroid disease    BP 130/88   Pulse 92   Resp 14   SpO2 96%   Opioid Risk Score:   Fall Risk Score:  `1  Depression screen PHQ 2/9  Depression screen [facility] 2/9 07/10/2016 04/17/2016 04/08/2016 01/10/2016 06/12/2015 03/29/2015 02/16/2015  Decreased Interest 0 0 1 1 1 1 1   Down, Depressed, Hopeless 0 0 0 0 0 0 0  PHQ - 2 Score 0 0 1 1 1 1 1   Altered sleeping - - - - - - 3  Tired, decreased energy - - - - - - 1  Change in appetite - - - - - - 1  Feeling bad or  failure about yourself  - - - - - - 0  Trouble concentrating - - - - - - 1  Moving slowly or fidgety/restless - - - - - - 0  Suicidal thoughts - - - - - - 0  PHQ-9 Score - - - - - - 7    Review of Systems  Constitutional: Negative.   HENT: Negative.   Eyes: Negative.   Respiratory: Negative.   Cardiovascular: Negative.   Gastrointestinal: Negative.   Endocrine: Negative.   Genitourinary: Negative.   Musculoskeletal: Negative.   Skin: Negative.   Allergic/Immunologic: Negative.   Neurological: Negative.   Hematological: Negative.   Psychiatric/Behavioral: Negative.   All other systems reviewed and are negative.      Objective:   Physical Exam  General: Alert and oriented x 3, No apparent distress. Has gained some weight.  HEENT: PERRL, EOMI  Neck: Supple without JVD or lymphadenopathy  Heart: RRR  Chest: clear  Abdomen: NT, soft.  Extremities: No clubbing, cyanosis, or edema. Pulses are 2+  Skin: Clean and intact without signs of breakdown. She has some mottling over the skin of the lower back---no rash,breakdown, non-tender.  Neuro: STRENGTH nearly 5/5. No consistent sensory loss. cognitvely intact. No CN findings.  Musculoskeletal: Right chest wall with some tenderness. Thought her posture was actually improved. Able to extend fully in lumbr and thoracic spine.  Psych: appears alert. Pleasant and appropriate.   Assessment & Plan:   1. Chronic seizure disorder---? myoclonic jerks/myotonia/ or some other abnormal muscle abnormality  2. 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  3. Chronic dysphagia  4. Right low back/flank pain  5. Fibromyalgia syndrome  6. Anxiety with depression  7. Pulmonary function abnormalities., ?CO2 retention? ---recent normal ECHO from 09/15/14 8. Osteoporosis:     Plan:  1. Continue Elavil at 75mg  qhs  2. Maintain baclofen to 20mg  for muscle spasms. Discussed  exercise and ROM.  3. Rheum follow up as ordered 4. Continue meloxicam 15mg  daily  5. Oxycodone 7.5mg  fr breakthrough pain #120.  6. Continue Gabapentin 600mg  QID for seizures, spasms, neuro pain---this appears to have helped.  7. Continue lidoderm patches for trunk/back pain.  8. Reviewed the importance of weight bearing exercise and posture as they pertain to her bone density. She'll continue calcium and vitamin D per her OBGYN. Plan for ovaries pending.    Follow up with NP or me in about one month. 15 minutes of face to face patient care time were spent during this visit. All questions were encouraged and answered.

## 2016-10-21 NOTE — Patient Instructions (Signed)
WORK ON INCREASING YOUR WALKING AND WEIGHT BEARING EXERCISES  USE GOOD POSTURE, STAND TALL!!!   PLEASE CALL ME WITH ANY PROBLEMS OR QUESTIONS [phone])   HAPPY HOLIDAYS!!!!                    *                * *             *   *   *         *  *   *  *  *     *  *  *  *  *  *  * *  *  *  *  *  *  *  *  *  * *               *  *               *  *               *  *

## 2016-10-21 NOTE — Telephone Encounter (Signed)
Please review and advise.

## 2016-10-23 ENCOUNTER — Encounter: Payer: Self-pay | Admitting: Nurse Practitioner

## 2016-10-23 ENCOUNTER — Ambulatory Visit (INDEPENDENT_AMBULATORY_CARE_PROVIDER_SITE_OTHER): Payer: BLUE CROSS/BLUE SHIELD | Admitting: Nurse Practitioner

## 2016-10-23 VITALS — BP 121/84 | HR 72 | Temp 98.2°F | Ht 68.0 in | Wt 222.0 lb

## 2016-10-23 DIAGNOSIS — D8989 Other specified disorders involving the immune mechanism, not elsewhere classified: Secondary | ICD-10-CM

## 2016-10-23 DIAGNOSIS — E559 Vitamin D deficiency, unspecified: Secondary | ICD-10-CM

## 2016-10-23 DIAGNOSIS — M797 Fibromyalgia: Secondary | ICD-10-CM | POA: Diagnosis not present

## 2016-10-23 DIAGNOSIS — E079 Disorder of thyroid, unspecified: Secondary | ICD-10-CM | POA: Diagnosis not present

## 2016-10-23 DIAGNOSIS — R600 Localized edema: Secondary | ICD-10-CM

## 2016-10-23 DIAGNOSIS — R569 Unspecified convulsions: Secondary | ICD-10-CM

## 2016-10-23 DIAGNOSIS — M62838 Other muscle spasm: Secondary | ICD-10-CM

## 2016-10-23 DIAGNOSIS — M329 Systemic lupus erythematosus, unspecified: Secondary | ICD-10-CM

## 2016-10-23 DIAGNOSIS — F3342 Major depressive disorder, recurrent, in full remission: Secondary | ICD-10-CM | POA: Diagnosis not present

## 2016-10-23 DIAGNOSIS — M818 Other osteoporosis without current pathological fracture: Secondary | ICD-10-CM

## 2016-10-23 DIAGNOSIS — K581 Irritable bowel syndrome with constipation: Secondary | ICD-10-CM | POA: Diagnosis not present

## 2016-10-23 DIAGNOSIS — E782 Mixed hyperlipidemia: Secondary | ICD-10-CM | POA: Diagnosis not present

## 2016-10-23 DIAGNOSIS — M359 Systemic involvement of connective tissue, unspecified: Secondary | ICD-10-CM | POA: Diagnosis not present

## 2016-10-23 DIAGNOSIS — M3219 Other organ or system involvement in systemic lupus erythematosus: Secondary | ICD-10-CM

## 2016-10-23 DIAGNOSIS — R609 Edema, unspecified: Secondary | ICD-10-CM

## 2016-10-23 DIAGNOSIS — I479 Paroxysmal tachycardia, unspecified: Secondary | ICD-10-CM

## 2016-10-23 DIAGNOSIS — K219 Gastro-esophageal reflux disease without esophagitis: Secondary | ICD-10-CM | POA: Diagnosis not present

## 2016-10-23 MED ORDER — DEXLANSOPRAZOLE 60 MG PO CPDR: 60.0000 mg | DELAYED_RELEASE_CAPSULE | Freq: Every day | ORAL | 5 refills | Status: DC

## 2016-10-23 MED ORDER — BACLOFEN 20 MG PO TABS: 20.0000 mg | ORAL_TABLET | Freq: Four times a day (QID) | ORAL | 3 refills | Status: DC

## 2016-10-23 MED ORDER — PREDNISONE 5 MG PO TABS: 5.0000 mg | ORAL_TABLET | Freq: Every morning | ORAL | 5 refills | Status: DC

## 2016-10-23 MED ORDER — AMITRIPTYLINE HCL 75 MG PO TABS: 75.0000 mg | ORAL_TABLET | Freq: Every day | ORAL | 4 refills | Status: DC

## 2016-10-23 MED ORDER — MELOXICAM 15 MG PO TABS: ORAL_TABLET | ORAL | 5 refills | Status: DC

## 2016-10-23 MED ORDER — FUROSEMIDE 20 MG PO TABS: ORAL_TABLET | ORAL | 5 refills | Status: DC

## 2016-10-23 MED ORDER — ACETAZOLAMIDE 250 MG PO TABS: 250.0000 mg | ORAL_TABLET | Freq: Three times a day (TID) | ORAL | 5 refills | Status: DC

## 2016-10-23 MED ORDER — ATORVASTATIN CALCIUM 40 MG PO TABS: ORAL_TABLET | ORAL | 1 refills | Status: DC

## 2016-10-23 MED ORDER — TAMSULOSIN HCL 0.4 MG PO CAPS: 0.4000 mg | ORAL_CAPSULE | Freq: Every day | ORAL | 5 refills | Status: DC

## 2016-10-23 MED ORDER — GABAPENTIN 600 MG PO TABS: 600.0000 mg | ORAL_TABLET | Freq: Four times a day (QID) | ORAL | 3 refills | Status: DC

## 2016-10-23 MED ORDER — METOPROLOL SUCCINATE ER 25 MG PO TB24: ORAL_TABLET | ORAL | 1 refills | Status: DC

## 2016-10-23 NOTE — Patient Instructions (Signed)
Fall Prevention in the Home Introduction Falls can cause injuries. They can happen to people of all ages. There are many things you can do to make your home safe and to help prevent falls. What can I do on the outside of my home?  Regularly fix the edges of walkways and driveways and fix any cracks.  Remove anything that might make you trip as you walk through a door, such as a raised step or threshold.  Trim any bushes or trees on the path to your home.  Use bright outdoor lighting.  Clear any walking paths of anything that might make someone trip, such as rocks or tools.  Regularly check to see if handrails are loose or broken. Make sure that both sides of any steps have handrails.  Any raised decks and porches should have guardrails on the edges.  Have any leaves, snow, or ice cleared regularly.  Use sand or salt on walking paths during winter.  Clean up any spills in your garage right away. This includes oil or grease spills. What can I do in the bathroom?  Use night lights.  Install grab bars by the toilet and in the tub and shower. Do not use towel bars as grab bars.  Use non-skid mats or decals in the tub or shower.  If you need to sit down in the shower, use a plastic, non-slip stool.  Keep the floor dry. Clean up any water that spills on the floor as soon as it happens.  Remove soap buildup in the tub or shower regularly.  Attach bath mats securely with double-sided non-slip rug tape.  Do not have throw rugs and other things on the floor that can make you trip. What can I do in the bedroom?  Use night lights.  Make sure that you have a light by your bed that is easy to reach.  Do not use any sheets or blankets that are too big for your bed. They should not hang down onto the floor.  Have a firm chair that has side arms. You can use this for support while you get dressed.  Do not have throw rugs and other things on the floor that can make you trip. What can  I do in the kitchen?  Clean up any spills right away.  Avoid walking on wet floors.  Keep items that you use a lot in easy-to-reach places.  If you need to reach something above you, use a strong step stool that has a grab bar.  Keep electrical cords out of the way.  Do not use floor polish or wax that makes floors slippery. If you must use wax, use non-skid floor wax.  Do not have throw rugs and other things on the floor that can make you trip. What can I do with my stairs?  Do not leave any items on the stairs.  Make sure that there are handrails on both sides of the stairs and use them. Fix handrails that are broken or loose. Make sure that handrails are as long as the stairways.  Check any carpeting to make sure that it is firmly attached to the stairs. Fix any carpet that is loose or worn.  Avoid having throw rugs at the top or bottom of the stairs. If you do have throw rugs, attach them to the floor with carpet tape.  Make sure that you have a light switch at the top of the stairs and the bottom of the stairs. If you   do not have them, ask someone to add them for you. What else can I do to help prevent falls?  Wear shoes that:  Do not have high heels.  Have rubber bottoms.  Are comfortable and fit you well.  Are closed at the toe. Do not wear sandals.  If you use a stepladder:  Make sure that it is fully opened. Do not climb a closed stepladder.  Make sure that both sides of the stepladder are locked into place.  Ask someone to hold it for you, if possible.  Clearly mark and make sure that you can see:  Any grab bars or handrails.  First and last steps.  Where the edge of each step is.  Use tools that help you move around (mobility aids) if they are needed. These include:  Canes.  Walkers.  Scooters.  Crutches.  Turn on the lights when you go into a dark area. Replace any light bulbs as soon as they burn out.  Set up your furniture so you have a  clear path. Avoid moving your furniture around.  If any of your floors are uneven, fix them.  If there are any pets around you, be aware of where they are.  Review your medicines with your doctor. Some medicines can make you feel dizzy. This can increase your chance of falling. Ask your doctor what other things that you can do to help prevent falls. This information is not intended to replace advice given to you by your health care provider. Make sure you discuss any questions you have with your health care provider. Document Released: 08/30/2009 Document Revised: 04/10/2016 Document Reviewed: 12/08/2014  2017 Elsevier  

## 2016-10-23 NOTE — Progress Notes (Signed)
Subjective:    Patient ID: Linita Alegria, female    DOB: September 17, 1986, 30 y.o.   MRN: [id number]  Patient here today for follow up of chronic medical problems.   Hyperlipidemia  This is a chronic problem. The current episode started more than 1 year ago. The problem is controlled. Recent lipid tests were reviewed and are normal. Factors aggravating her hyperlipidemia include fatty foods. Pertinent negatives include no chest pain. Current antihyperlipidemic treatment includes statins. The current treatment provides no improvement of lipids. There are no compliance problems.  Risk factors for coronary artery disease include dyslipidemia and obesity.  Thyroid Problem  Presents for follow-up visit. Patient reports no fatigue or palpitations. The symptoms have been stable. The treatment provided significant relief. Her past medical history is significant for hyperlipidemia.  GERD Currently managed with prilosec at this time, no side effects IBS Currently managed well, denies any complications at this visit Fibromyalgia States she is doing well, no recent flares of pain Depression Managing pretty well at this time, remains on Elavil with no side effects Lupus/Autoimmune disorder/muscle spaasticity States she has good days and bad days, but managing ok at this time. Was said to have nonspecific type with multiorgan involvement. osteoporosis Probably steroid induced- they want her treated by PCP. She is already on vitamin d and calcium. Vitamin d def Vitamin d 2000iu OTC daily  Review of Systems  Constitutional: Negative for activity change, appetite change and fatigue.  HENT: Negative.   Respiratory: Negative.   Cardiovascular: Negative for chest pain and palpitations.  Gastrointestinal: Negative.   Endocrine: Negative.   Genitourinary: Negative.   Skin: Negative.   Neurological: Negative for dizziness and light-headedness.  Psychiatric/Behavioral: Negative.   All other systems reviewed  and are negative.      Objective:   Physical Exam  Constitutional: She is oriented to person, place, and time. She appears well-developed and well-nourished.  HENT:  Right Ear: External ear normal.  Left Ear: External ear normal.  Nose: Nose normal.  Mouth/Throat: Oropharynx is clear and moist.  Eyes: Pupils are equal, round, and reactive to light.  Neck: Normal range of motion. Neck supple.  Cardiovascular: Normal rate, regular rhythm and normal heart sounds.   Pulmonary/Chest: Effort normal and breath sounds normal.  Abdominal: Soft. Bowel sounds are normal.  Neurological: She is alert and oriented to person, place, and time. She has normal reflexes. No cranial nerve deficit.  Skin: Skin is warm and dry.  Psychiatric: She has a normal mood and affect. Her behavior is normal. Judgment and thought content normal.   BP 121/84   Pulse 72   Temp 98.2 F (36.8 C) (Oral)   Ht 5\' 8"  (1.727 m)   Wt 222 lb (100.7 kg)   BMI 33.75 kg/m        Assessment & Plan:   1. Gastroesophageal reflux disease without esophagitis Avoid spicy foods Do not eat 2 hours prior to bedtime - dexlansoprazole (DEXILANT) 60 MG capsule; Take 1 capsule (60 mg total) by mouth daily.  Dispense: 30 capsule; Refill: 5  2. Irritable bowel syndrome with constipation Watch diet- avoid foods that cause irritation to bowel - amitriptyline (ELAVIL) 75 MG tablet; Take 1 tablet (75 mg total) by mouth at bedtime.  Dispense: 30 tablet; Refill: 4 - baclofen (LIORESAL) 20 MG tablet; Take 1 tablet (20 mg total) by mouth 4 (four) times daily.  Dispense: 120 each; Refill: 3  3. Thyroid disease  4. Recurrent major depressive disorder, in full remission (  HCC) Stress management  5. Fibromyalgia Exercise as much as possible - meloxicam (MOBIC) 15 MG tablet; TAKE ONE (1) TABLET EACH DAY  Dispense: 30 tablet; Refill: 5  6. Mixed hyperlipidemia Low fat diet - CMP14+EGFR - Lipid panel - atorvastatin (LIPITOR) 40 MG  tablet; TAKE ONE (1) TABLET EACH DAY  Dispense: 90 tablet; Refill: 1  7. Inflammatory autoimmune disorder (HCC)  8. Systemic lupus erythematosus, unspecified SLE type, unspecified organ involvement status (HCC) Keep follow up with rheumatologist  9. Muscle spasticity  10. Vitamin D deficiency - VITAMIN D 25 Hydroxy (Vit-D Deficiency, Fractures)  11. Other osteoporosis without current pathological fracture Weight bearing exercise is can tolerate Will discuss bone density results with clinical pharmacist. - PTH, Intact and Calcium  12. Systemic lupus erythematosus with other organ involvement, unspecified SLE type (HCC) - gabapentin (NEURONTIN) 600 MG tablet; Take 1 tablet (600 mg total) by mouth 4 (four) times daily.  Dispense: 120 tablet; Refill: 3 - predniSONE (DELTASONE) 5 MG tablet; Take 1 tablet (5 mg total) by mouth every morning.  Dispense: 30 tablet; Refill: 5  13. Peripheral edema - furosemide (LASIX) 20 MG tablet; TAKE ONE (1) TABLET EACH DAY  Dispense: 30 tablet; Refill: 5  14. Tachycardia, paroxysmal (HCC) - metoprolol succinate (TOPROL-XL) 25 MG 24 hr tablet; TAKE ONE (1) TABLET EACH DAY  Dispense: 90 tablet; Refill: 1  15. Seizures (HCC) - acetaZOLAMIDE (DIAMOX) 250 MG tablet; Take 1 tablet (250 mg total) by mouth 3 (three) times daily. Take 250 mg two times a day; then 500 mg at bedtime  Dispense: 90 tablet; Refill: 5    Labs pending Health maintenance reviewed Diet and exercise encouraged Continue all meds Follow up  In 6 months   Mary-Margaret Daphine Deutscher, FNP

## 2016-10-24 LAB — LIPID PANEL
CHOLESTEROL TOTAL: 217 mg/dL — AB (ref 100–199)
Chol/HDL Ratio: 3.7 ratio units (ref 0.0–4.4)
HDL: 58 mg/dL (ref >39)
LDL Calculated: 111 mg/dL — ABNORMAL HIGH (ref 0–99)
Triglycerides: 238 mg/dL — ABNORMAL HIGH (ref 0–149)
VLDL CHOLESTEROL CAL: 48 mg/dL — AB (ref 5–40)

## 2016-10-24 LAB — PTH, INTACT AND CALCIUM
CALCIUM: 9.4 mg/dL (ref 8.7–10.2)
PTH: 25 pg/mL (ref 15–65)

## 2016-10-24 LAB — CMP14+EGFR
ALT: 14 IU/L (ref 0–32)
AST: 14 IU/L (ref 0–40)
Albumin/Globulin Ratio: 1.9 (ref 1.2–2.2)
Albumin: 4.5 g/dL (ref 3.5–5.5)
Alkaline Phosphatase: 112 IU/L (ref 39–117)
BUN/Creatinine Ratio: 9 (ref 9–23)
BUN: 7 mg/dL (ref 6–20)
Bilirubin Total: 0.2 mg/dL (ref 0.0–1.2)
CALCIUM: 9.6 mg/dL (ref 8.7–10.2)
CO2: 22 mmol/L (ref 18–29)
Chloride: 108 mmol/L — ABNORMAL HIGH (ref 96–106)
Creatinine, Ser: 0.8 mg/dL (ref 0.57–1.00)
GFR, EST AFRICAN AMERICAN: 113 mL/min/{1.73_m2} (ref >59)
GFR, EST NON AFRICAN AMERICAN: 98 mL/min/{1.73_m2} (ref >59)
GLUCOSE: 113 mg/dL — AB (ref 65–99)
Globulin, Total: 2.4 g/dL (ref 1.5–4.5)
Potassium: 3.5 mmol/L (ref 3.5–5.2)
Sodium: 148 mmol/L — ABNORMAL HIGH (ref 134–144)
TOTAL PROTEIN: 6.9 g/dL (ref 6.0–8.5)

## 2016-10-24 LAB — VITAMIN D 25 HYDROXY (VIT D DEFICIENCY, FRACTURES): VIT D 25 HYDROXY: 25 ng/mL — AB (ref 30.0–100.0)

## 2016-10-30 ENCOUNTER — Ambulatory Visit: Payer: BLUE CROSS/BLUE SHIELD | Admitting: Nurse Practitioner

## 2016-10-30 ENCOUNTER — Other Ambulatory Visit: Payer: Self-pay | Admitting: Nurse Practitioner

## 2016-10-30 MED ORDER — CIPROFLOXACIN HCL 500 MG PO TABS: 500.0000 mg | ORAL_TABLET | Freq: Two times a day (BID) | ORAL | 0 refills | Status: DC

## 2016-11-06 DIAGNOSIS — M818 Other osteoporosis without current pathological fracture: Secondary | ICD-10-CM | POA: Diagnosis not present

## 2016-11-14 ENCOUNTER — Telehealth: Payer: Self-pay

## 2016-11-14 NOTE — Telephone Encounter (Signed)
Patient is aware- someon else rx this.

## 2016-11-20 ENCOUNTER — Encounter: Payer: BLUE CROSS/BLUE SHIELD | Attending: Physical Medicine & Rehabilitation | Admitting: Registered Nurse

## 2016-11-20 ENCOUNTER — Encounter: Payer: Self-pay | Admitting: Registered Nurse

## 2016-11-20 VITALS — BP 139/62 | HR 100 | Resp 14

## 2016-11-20 DIAGNOSIS — M329 Systemic lupus erythematosus, unspecified: Secondary | ICD-10-CM | POA: Diagnosis not present

## 2016-11-20 DIAGNOSIS — M797 Fibromyalgia: Secondary | ICD-10-CM | POA: Diagnosis not present

## 2016-11-20 DIAGNOSIS — M545 Low back pain: Secondary | ICD-10-CM | POA: Insufficient documentation

## 2016-11-20 DIAGNOSIS — M5417 Radiculopathy, lumbosacral region: Secondary | ICD-10-CM | POA: Diagnosis not present

## 2016-11-20 DIAGNOSIS — M6249 Contracture of muscle, multiple sites: Secondary | ICD-10-CM | POA: Insufficient documentation

## 2016-11-20 DIAGNOSIS — M62838 Other muscle spasm: Secondary | ICD-10-CM

## 2016-11-20 DIAGNOSIS — R569 Unspecified convulsions: Secondary | ICD-10-CM | POA: Diagnosis not present

## 2016-11-20 DIAGNOSIS — Z5181 Encounter for therapeutic drug level monitoring: Secondary | ICD-10-CM

## 2016-11-20 DIAGNOSIS — G894 Chronic pain syndrome: Secondary | ICD-10-CM

## 2016-11-20 DIAGNOSIS — R0781 Pleurodynia: Secondary | ICD-10-CM

## 2016-11-20 DIAGNOSIS — Z79899 Other long term (current) drug therapy: Secondary | ICD-10-CM

## 2016-11-20 MED ORDER — OXYCODONE-ACETAMINOPHEN 7.5-325 MG PO TABS: 1.0000 | ORAL_TABLET | Freq: Every day | ORAL | 0 refills | Status: DC | PRN

## 2016-11-20 NOTE — Progress Notes (Signed)
Subjective:    Patient ID: Autumn Davis, female    DOB: Jan 16, 1986, 31 y.o.   MRN: [id number]  HPI:  Autumn Davis is a 31 year old female who returns for follow up appointment for chronic pain and medication refill. She states her pain is located in her bilateral ribs and lower back radiating into left lower extremity layerally..She rates her pain 5. Her current exercise regime is performing stretching exercises and walking.  Autumn Davis was prescribed Cipro by her PCP, she developed increase muscle weakness and tightness in her lower extremities. Medication was discontinued and she was prescribed Prednisone. Still experiencing mild weakness continuing with HEP. Will continue to monitor instructed to call office with any questions and concerns.   She's scheduled to see an Endocrinologist on 12/23/16 was referred by her PCP.   Autumn Davis has been diagnosed with Osteoporosis PCP and Rheumatology following.    Pain Inventory Average Pain 7 Pain Right Now 5 My pain is constant, sharp, burning, dull, stabbing and tingling  In the last 24 hours, has pain interfered with the following? General activity 7 Relation with others 7 Enjoyment of life 5 What TIME of day is your pain at its worst? evening, night Sleep (in general) Poor  Pain is worse with: walking, bending, sitting, inactivity, standing and some activites Pain improves with: rest, heat/ice, therapy/exercise, pacing activities, medication and TENS Relief from Meds: 5  Mobility walk without assistance walk with assistance use a cane ability to climb steps?  yes do you drive?  no Do you have any goals in this area?  yes  Function disabled: date disabled n/a I need assistance with the following:  meal prep, household duties and shopping  Neuro/Psych bladder control problems bowel control problems weakness numbness tremor tingling trouble walking spasms dizziness  Prior Studies Any changes since last  visit?  no  Physicians involved in your care Any changes since last visit?  no   Family History  Problem Relation Age of Onset  . Thyroid disease Mother   . Colon polyps Father   . Lung cancer Maternal Grandmother   . Cancer Paternal Grandmother     colon/pancreatiec/lymphoma  . Irritable bowel syndrome Brother    Social History   Social History  . Marital status: Married    Spouse name: N/A  . Number of children: N/A  . Years of education: N/A   Occupational History  . disabled [employer]   Social History Main Topics  . Smoking status: Never Smoker  . Smokeless tobacco: Never Used  . Alcohol use No  . Drug use: No  . Sexual activity: Not Asked   Other Topics Concern  . None   Social History Narrative  . None   Past Surgical History:  Procedure Laterality Date  . 24 HOUR PH STUDY N/A 10/29/2015   Procedure: 24 HOUR PH STUDY;  Surgeon: Iva Boop, MD;  Location: [facility];  Service: Endoscopy;  Laterality: N/A;  . COLONOSCOPY    . ESOPHAGEAL MANOMETRY N/A 10/29/2015   Procedure: ESOPHAGEAL MANOMETRY (EM);  Surgeon: Iva Boop, MD;  Location: [facility];  Service: Endoscopy;  Laterality: N/A;  . KNEE SURGERY  2002/2003   bil  . RECTAL SURGERY  correction of prolapse   2010   Past Medical History:  Diagnosis Date  . Allergy   . Arthritis    HANDS,HIPS,KNEES  . Bronchitis, chronic/intermittent 01/22/2012  . Depression 01/22/2012  . GERD (gastroesophageal reflux disease)   .  Hyperlipidemia   . IBS (irritable bowel syndrome) 01/22/2012  . Lupus   . Neuromuscular disorder (HCC)   . Osteoporosis   . Seizures (HCC)   . SOB (shortness of breath)   . Thyroid disease    BP 139/62   Pulse 100   Resp 14   SpO2 98%   Opioid Risk Score:   Fall Risk Score:  `1  Depression screen PHQ 2/9  Depression screen [facility] 2/9 10/23/2016 07/10/2016 04/17/2016 04/08/2016 01/10/2016 06/12/2015 03/29/2015  Decreased Interest 1 0 0 1 1 1 1   Down, Depressed,  Hopeless 0 0 0 0 0 0 0  PHQ - 2 Score 1 0 0 1 1 1 1   Altered sleeping - - - - - - -  Tired, decreased energy - - - - - - -  Change in appetite - - - - - - -  Feeling bad or failure about yourself  - - - - - - -  Trouble concentrating - - - - - - -  Moving slowly or fidgety/restless - - - - - - -  Suicidal thoughts - - - - - - -  PHQ-9 Score - - - - - - -    Review of Systems  Constitutional: Negative.   HENT: Negative.   Eyes: Negative.   Respiratory: Negative.   Cardiovascular: Negative.   Gastrointestinal: Negative.   Endocrine: Negative.   Genitourinary: Negative.   Musculoskeletal: Negative.   Skin: Negative.   Allergic/Immunologic: Negative.   Neurological: Negative.   Hematological: Negative.   Psychiatric/Behavioral: Negative.   All other systems reviewed and are negative.      Objective:   Physical Exam  Constitutional: She is oriented to person, place, and time. She appears well-developed and well-nourished.  HENT:  Head: Normocephalic and atraumatic.  Neck: Normal range of motion. Neck supple.  Cardiovascular: Normal rate and regular rhythm.   Pulmonary/Chest: Effort normal and breath sounds normal.  Musculoskeletal:  Normal Muscle Bulk and Muscle Testing Reveals: Upper Extremities: Right: Full ROM and Muscle Strength 4/5 Left: Decreased ROM 90 Degrees and Muscle Strength 4/5 Thoracic Paraspinal Tenderness: T-7-T-9 Lower Extremities: Full ROM and Muscle Strength 5/5 Arises from Table Slowly Antalgic gait   Neurological: She is alert and oriented to person, place, and time.  Skin: Skin is warm and dry.  Psychiatric: She has a normal mood and affect.  Nursing note and vitals reviewed.         Assessment & Plan:  1. Chronic seizure disorder: No seizure's. Continue Gabapentin. Neurology Following  2. Chronic muscle spasms, weakness with associated pain disorder: Continue Baclofen and MOBIC. Continue with Exercise regime. 3. Chronic dysphagia: GI  Following  4. Anxiety with depression : Stable. Continue Elavil  5. Fibromyalgia/ Chronic Pain: Continue with exercise and heat Therapy.  Refilled: oxyCODONE 7.5/325mg  onetablet 5 times daily as needed #150.  We will continue opioid monitoring program, this consists of regular clinic visits, examinations, urine drug screen, pill counts as well as use of [place] Controlled Substance Reporting System. 6. Peripheral Neuropathy: Continue Gabapentin  20 minutes of face to face patient care time was spent during this visit. All questions were encouraged and answered

## 2016-12-04 ENCOUNTER — Telehealth: Payer: Self-pay | Admitting: Registered Nurse

## 2016-12-04 NOTE — Telephone Encounter (Signed)
On January 18,2018 NCCSR was reviewed: No conflict was seen on the [place]  Controlled Substance Reporting System with Multiple Prescribers. [name] has a signed  Narcotic Contract with our office. If there were any discrepancies this would have been  reported to her  Physcian.

## 2016-12-12 ENCOUNTER — Encounter: Payer: Self-pay | Admitting: Registered Nurse

## 2016-12-12 ENCOUNTER — Encounter (HOSPITAL_BASED_OUTPATIENT_CLINIC_OR_DEPARTMENT_OTHER): Payer: BLUE CROSS/BLUE SHIELD | Admitting: Registered Nurse

## 2016-12-12 VITALS — BP 126/88 | HR 87 | Resp 14

## 2016-12-12 DIAGNOSIS — R0781 Pleurodynia: Secondary | ICD-10-CM | POA: Diagnosis not present

## 2016-12-12 DIAGNOSIS — M329 Systemic lupus erythematosus, unspecified: Secondary | ICD-10-CM

## 2016-12-12 DIAGNOSIS — Z79899 Other long term (current) drug therapy: Secondary | ICD-10-CM

## 2016-12-12 DIAGNOSIS — G894 Chronic pain syndrome: Secondary | ICD-10-CM

## 2016-12-12 DIAGNOSIS — M797 Fibromyalgia: Secondary | ICD-10-CM | POA: Diagnosis not present

## 2016-12-12 DIAGNOSIS — R569 Unspecified convulsions: Secondary | ICD-10-CM | POA: Diagnosis not present

## 2016-12-12 DIAGNOSIS — M5417 Radiculopathy, lumbosacral region: Secondary | ICD-10-CM

## 2016-12-12 DIAGNOSIS — M6249 Contracture of muscle, multiple sites: Secondary | ICD-10-CM | POA: Diagnosis not present

## 2016-12-12 DIAGNOSIS — M62838 Other muscle spasm: Secondary | ICD-10-CM

## 2016-12-12 DIAGNOSIS — Z5181 Encounter for therapeutic drug level monitoring: Secondary | ICD-10-CM

## 2016-12-12 DIAGNOSIS — M545 Low back pain: Secondary | ICD-10-CM | POA: Diagnosis not present

## 2016-12-12 MED ORDER — OXYCODONE-ACETAMINOPHEN 7.5-325 MG PO TABS: 1.0000 | ORAL_TABLET | Freq: Every day | ORAL | 0 refills | Status: DC | PRN

## 2016-12-12 NOTE — Progress Notes (Signed)
Subjective:    Patient ID: Autumn Davis, female    DOB: [birth year], 31 y.o.   MRN: [id number]  HPI: [name] is a 31 year old female who returns for follow up appointment for chronic pain and medication refill. She states her pain is located in her bilateral ribs and left hip radiating into left lower extremity laterally.She rates her pain 8. Her current exercise regime is performing stretching exercises and walking.  She's scheduled to see an Endocrinologist on 12/23/16 was referred by her PCP.   Pain Inventory Average Pain 6 Pain Right Now 8 My pain is constant, sharp, burning, dull, stabbing, tingling and aching  In the last 24 hours, has pain interfered with the following? General activity 5 Relation with others 5 Enjoyment of life 5 What TIME of day is your pain at its worst? evening, night Sleep (in general) Poor  Pain is worse with: walking, bending, sitting, inactivity, standing and unsure Pain improves with: rest, heat/ice, therapy/exercise, pacing activities, medication and TENS Relief from Meds: 4  Mobility walk without assistance walk with assistance use a cane ability to climb steps?  yes do you drive?  no use a wheelchair needs help with transfers  Function disabled: date disabled n/a I need assistance with the following:  meal prep, household duties and shopping  Neuro/Psych bladder control problems bowel control problems weakness numbness tremor tingling trouble walking spasms dizziness  Prior Studies Any changes since last visit?  no  Physicians involved in your care Any changes since last visit?  no   Family History  Problem Relation Age of Onset  . Thyroid disease Mother   . Colon polyps Father   . Lung cancer Maternal Grandmother   . Cancer Paternal Grandmother     colon/pancreatiec/lymphoma  . Irritable bowel syndrome Brother    Social History   Social History  . Marital status: Married    Spouse name: N/A  .  Number of children: N/A  . Years of education: N/A   Occupational History  . disabled [employer]   Social History Main Topics  . Smoking status: Never Smoker  . Smokeless tobacco: Never Used  . Alcohol use No  . Drug use: No  . Sexual activity: Not Asked   Other Topics Concern  . None   Social History Narrative  . None   Past Surgical History:  Procedure Laterality Date  . 24 HOUR PH STUDY N/A 10/29/2015   Procedure: 24 HOUR PH STUDY;  Surgeon: Iva Boop, MD;  Location: [facility];  Service: Endoscopy;  Laterality: N/A;  . COLONOSCOPY    . ESOPHAGEAL MANOMETRY N/A 10/29/2015   Procedure: ESOPHAGEAL MANOMETRY (EM);  Surgeon: Iva Boop, MD;  Location: [facility];  Service: Endoscopy;  Laterality: N/A;  . KNEE SURGERY  2002/2003   bil  . RECTAL SURGERY  correction of prolapse   2010   Past Medical History:  Diagnosis Date  . Allergy   . Arthritis    HANDS,HIPS,KNEES  . Bronchitis, chronic/intermittent 01/22/2012  . Depression 01/22/2012  . GERD (gastroesophageal reflux disease)   . Hyperlipidemia   . IBS (irritable bowel syndrome) 01/22/2012  . Lupus   . Neuromuscular disorder (HCC)   . Osteoporosis   . Seizures (HCC)   . SOB (shortness of breath)   . Thyroid disease    BP 126/88   Pulse 87   Resp 14   SpO2 98%   Opioid Risk Score:   Fall  Risk Score:  `1  Depression screen PHQ 2/9  Depression screen [facility] 2/9 10/23/2016 07/10/2016 04/17/2016 04/08/2016 01/10/2016 06/12/2015 03/29/2015  Decreased Interest 1 0 0 1 1 1 1   Down, Depressed, Hopeless 0 0 0 0 0 0 0  PHQ - 2 Score 1 0 0 1 1 1 1   Altered sleeping - - - - - - -  Tired, decreased energy - - - - - - -  Change in appetite - - - - - - -  Feeling bad or failure about yourself  - - - - - - -  Trouble concentrating - - - - - - -  Moving slowly or fidgety/restless - - - - - - -  Suicidal thoughts - - - - - - -  PHQ-9 Score - - - - - - -      Review of Systems  Constitutional:  Negative.   HENT: Negative.   Eyes: Negative.   Respiratory: Negative.   Cardiovascular: Negative.   Gastrointestinal: Negative.   Endocrine: Negative.   Genitourinary: Negative.   Musculoskeletal: Negative.   Skin: Negative.   Allergic/Immunologic: Negative.   Neurological: Negative.   Hematological: Negative.   Psychiatric/Behavioral: Negative.   All other systems reviewed and are negative.      Objective:   Physical Exam  Constitutional: She is oriented to person, place, and time. She appears well-developed and well-nourished.  HENT:  Head: Normocephalic and atraumatic.  Neck: Normal range of motion. Neck supple.  Cardiovascular: Normal rate and regular rhythm.   Pulmonary/Chest: Effort normal and breath sounds normal.  Musculoskeletal:  Normal Muscle Bulk and Muscle Testing Reveals: Upper Extremities: Full ROM and Muscle Strength 5/5 Thoracic Paraspinal Tenderness: T-7-T-9 Lower Extremities: Full ROM and Muscle Strength 5/5 Arises from Table slowly Narrow Based gait  Neurological: She is alert and oriented to person, place, and time.  Skin: Skin is warm and dry.  Psychiatric: She has a normal mood and affect.  Nursing note and vitals reviewed.         Assessment & Plan:  1. Chronic seizure disorder: No seizure's. Continue Gabapentin. Neurology Following  2. Chronic muscle spasms, weakness with associated pain disorder: Continue Baclofen and MOBIC. Continue with Exercise regime. 3. Chronic dysphagia: GI Following  4. Anxiety with depression : Stable. Continue Elavil  5. Fibromyalgia/ Chronic Pain: Continue with exercise and heat Therapy.  Refilled: oxyCODONE 7.5/325mg  onetablet 5 times daily as needed #150. We will continue opioid monitoring program, this consists of regular clinic visits, examinations, urine drug screen, pill counts as well as use of [place] Controlled Substance Reporting System. 6. Peripheral Neuropathy: Continue  Gabapentin  20 minutes of face to face patient care time was spent during this visit. All questions were encouraged and answered

## 2016-12-25 DIAGNOSIS — E282 Polycystic ovarian syndrome: Secondary | ICD-10-CM | POA: Diagnosis not present

## 2016-12-25 DIAGNOSIS — E559 Vitamin D deficiency, unspecified: Secondary | ICD-10-CM | POA: Diagnosis not present

## 2016-12-25 DIAGNOSIS — Z7952 Long term (current) use of systemic steroids: Secondary | ICD-10-CM | POA: Diagnosis not present

## 2016-12-25 DIAGNOSIS — M810 Age-related osteoporosis without current pathological fracture: Secondary | ICD-10-CM | POA: Diagnosis not present

## 2017-01-07 ENCOUNTER — Other Ambulatory Visit: Payer: Self-pay | Admitting: Nurse Practitioner

## 2017-01-09 ENCOUNTER — Encounter: Payer: BLUE CROSS/BLUE SHIELD | Attending: Physical Medicine & Rehabilitation | Admitting: Registered Nurse

## 2017-01-09 ENCOUNTER — Encounter: Payer: Self-pay | Admitting: Registered Nurse

## 2017-01-09 VITALS — BP 125/85 | HR 98

## 2017-01-09 DIAGNOSIS — M797 Fibromyalgia: Secondary | ICD-10-CM | POA: Diagnosis not present

## 2017-01-09 DIAGNOSIS — R569 Unspecified convulsions: Secondary | ICD-10-CM | POA: Diagnosis not present

## 2017-01-09 DIAGNOSIS — G609 Hereditary and idiopathic neuropathy, unspecified: Secondary | ICD-10-CM | POA: Diagnosis not present

## 2017-01-09 DIAGNOSIS — R0781 Pleurodynia: Secondary | ICD-10-CM | POA: Diagnosis not present

## 2017-01-09 DIAGNOSIS — M545 Low back pain: Secondary | ICD-10-CM | POA: Diagnosis not present

## 2017-01-09 DIAGNOSIS — M62838 Other muscle spasm: Secondary | ICD-10-CM

## 2017-01-09 DIAGNOSIS — M6249 Contracture of muscle, multiple sites: Secondary | ICD-10-CM | POA: Insufficient documentation

## 2017-01-09 DIAGNOSIS — G894 Chronic pain syndrome: Secondary | ICD-10-CM | POA: Diagnosis not present

## 2017-01-09 DIAGNOSIS — Z5181 Encounter for therapeutic drug level monitoring: Secondary | ICD-10-CM

## 2017-01-09 DIAGNOSIS — Z79899 Other long term (current) drug therapy: Secondary | ICD-10-CM | POA: Diagnosis not present

## 2017-01-09 DIAGNOSIS — M329 Systemic lupus erythematosus, unspecified: Secondary | ICD-10-CM | POA: Insufficient documentation

## 2017-01-09 MED ORDER — OXYCODONE-ACETAMINOPHEN 7.5-325 MG PO TABS: 1.0000 | ORAL_TABLET | Freq: Every day | ORAL | 0 refills | Status: DC | PRN

## 2017-01-09 NOTE — Addendum Note (Signed)
Addended by: Barbee ShropshireBRIGHT, Kimari Coudriet B on: 01/09/2017 03:44 PM   Modules accepted: Orders

## 2017-01-09 NOTE — Progress Notes (Signed)
Subjective:    Patient ID: Autumn Davis, female    DOB: 1986/07/20, 31 y.o.   MRN: [id number]  HPI: Autumn Davis is a 31 year old female who returns for follow up appointment for chronic pain and medication refill. She states her pain is located in her bilateral rib pain.She rates her pain 5. Her current exercise regime is performing stretching exercises and walking.  Pain Inventory Average Pain 6 Pain Right Now 5 My pain is constant, sharp, burning, dull, stabbing and tingling  In the last 24 hours, has pain interfered with the following? General activity 5 Relation with others 5 Enjoyment of life 5 What TIME of day is your pain at its worst? daytime Sleep (in general) Poor  Pain is worse with: walking, bending, sitting, standing and unsure Pain improves with: rest, heat/ice, therapy/exercise, pacing activities, medication and TENS Relief from Meds: 5  Mobility walk without assistance walk with assistance use a cane ability to climb steps?  yes do you drive?  no  Function disabled: date disabled . I need assistance with the following:  meal prep, household duties and shopping  Neuro/Psych bladder control problems bowel control problems weakness numbness tremor tingling trouble walking spasms dizziness  Prior Studies Any changes since last visit?  yes  Physicians involved in your care Any changes since last visit?  no   Family History  Problem Relation Age of Onset  . Thyroid disease Mother   . Colon polyps Father   . Lung cancer Maternal Grandmother   . Cancer Paternal Grandmother     colon/pancreatiec/lymphoma  . Irritable bowel syndrome Brother    Social History   Social History  . Marital status: Married    Spouse name: N/A  . Number of children: N/A  . Years of education: N/A   Occupational History  . disabled [employer]   Social History Main Topics  . Smoking status: Never Smoker  . Smokeless tobacco: Never  Used  . Alcohol use No  . Drug use: No  . Sexual activity: Not on file   Other Topics Concern  . Not on file   Social History Narrative  . No narrative on file   Past Surgical History:  Procedure Laterality Date  . 24 HOUR PH STUDY N/A 10/29/2015   Procedure: 24 HOUR PH STUDY;  Surgeon: Iva Boop, MD;  Location: [facility];  Service: Endoscopy;  Laterality: N/A;  . COLONOSCOPY    . ESOPHAGEAL MANOMETRY N/A 10/29/2015   Procedure: ESOPHAGEAL MANOMETRY (EM);  Surgeon: Iva Boop, MD;  Location: [facility];  Service: Endoscopy;  Laterality: N/A;  . KNEE SURGERY  2002/2003   bil  . RECTAL SURGERY  correction of prolapse   2010   Past Medical History:  Diagnosis Date  . Allergy   . Arthritis    HANDS,HIPS,KNEES  . Bronchitis, chronic/intermittent 01/22/2012  . Depression 01/22/2012  . GERD (gastroesophageal reflux disease)   . Hyperlipidemia   . IBS (irritable bowel syndrome) 01/22/2012  . Lupus   . Neuromuscular disorder (HCC)   . Osteoporosis   . Seizures (HCC)   . SOB (shortness of breath)   . Thyroid disease    There were no vitals taken for this visit.  Opioid Risk Score:   Fall Risk Score:  `1  Depression screen PHQ 2/9  Depression screen [facility] 2/9 10/23/2016 07/10/2016 04/17/2016 04/08/2016 01/10/2016 06/12/2015 03/29/2015  Decreased Interest 1 0 0 1 1 1 1   Down, Depressed,  Hopeless 0 0 0 0 0 0 0  PHQ - 2 Score 1 0 0 1 1 1 1   Altered sleeping - - - - - - -  Tired, decreased energy - - - - - - -  Change in appetite - - - - - - -  Feeling bad or failure about yourself  - - - - - - -  Trouble concentrating - - - - - - -  Moving slowly or fidgety/restless - - - - - - -  Suicidal thoughts - - - - - - -  PHQ-9 Score - - - - - - -    Review of Systems  Constitutional: Negative.   HENT: Negative.   Eyes: Negative.   Respiratory: Negative.   Cardiovascular: Negative.   Gastrointestinal: Negative.   Endocrine: Negative.   Genitourinary: Negative.     Musculoskeletal: Negative.   Skin: Negative.   Allergic/Immunologic: Negative.   Neurological: Negative.   Hematological: Negative.   Psychiatric/Behavioral: Negative.        Objective:   Physical Exam  Constitutional: She is oriented to person, place, and time. She appears well-developed and well-nourished.  HENT:  Head: Normocephalic and atraumatic.  Neck: Normal range of motion. Neck supple.  Cardiovascular: Normal rate and regular rhythm.   Pulmonary/Chest: Effort normal.  Musculoskeletal:  Normal Muscle Bulk and Muscle Testing Reveals: Upper Extremities: Full ROM and Muscle Strength on Right 5/5 and Left 4/5 Thoracic Paraspinal Tenderness: T-7-T-9 Lower Extremities: Full ROM and Muscle Strength 5/5 Arises from Table with ease Narrow Based Gait  Neurological: She is alert and oriented to person, place, and time.  Skin: Skin is warm and dry.  Psychiatric: She has a normal mood and affect.  Nursing note and vitals reviewed.         Assessment & Plan:  . Chronic seizure disorder: No seizure's. Continue Gabapentin. Neurology Following. 01/09/2017 2. Chronic muscle spasms, weakness with associated pain disorder: Continue Baclofen and MOBIC. Continue with Exercise regime. 01/09/2017 3. Chronic dysphagia: GI Following. 01/09/2017 4. Anxiety with depression : Stable. Continue Elavil. 01/09/2017 5. Fibromyalgia/ Chronic Pain: Continue with exercise and heat Therapy. 01/09/2017 Refilled: oxyCODONE 7.5/325mg  onetablet 5 times daily as needed #150. We will continue opioid monitoring program, this consists of regular clinic visits, examinations, urine drug screen, pill counts as well as use of [place] Controlled Substance Reporting System. 6. Peripheral Neuropathy: Continue Gabapentin. 01/09/2017   20 minutes of face to face patient care time was spent during this visit. All questions were encouraged and answered.  F/U in 1 month

## 2017-01-15 LAB — TOXASSURE SELECT,+ANTIDEPR,UR

## 2017-01-17 ENCOUNTER — Other Ambulatory Visit: Payer: Self-pay | Admitting: Nurse Practitioner

## 2017-01-23 ENCOUNTER — Ambulatory Visit: Payer: BLUE CROSS/BLUE SHIELD | Admitting: Nurse Practitioner

## 2017-02-05 ENCOUNTER — Encounter: Payer: Self-pay | Admitting: Registered Nurse

## 2017-02-05 ENCOUNTER — Encounter: Payer: BLUE CROSS/BLUE SHIELD | Attending: Physical Medicine & Rehabilitation | Admitting: Registered Nurse

## 2017-02-05 VITALS — BP 125/79 | HR 88

## 2017-02-05 DIAGNOSIS — Z79899 Other long term (current) drug therapy: Secondary | ICD-10-CM | POA: Diagnosis not present

## 2017-02-05 DIAGNOSIS — G894 Chronic pain syndrome: Secondary | ICD-10-CM | POA: Diagnosis not present

## 2017-02-05 DIAGNOSIS — M797 Fibromyalgia: Secondary | ICD-10-CM | POA: Diagnosis not present

## 2017-02-05 DIAGNOSIS — R0781 Pleurodynia: Secondary | ICD-10-CM | POA: Diagnosis not present

## 2017-02-05 DIAGNOSIS — M545 Low back pain: Secondary | ICD-10-CM | POA: Diagnosis present

## 2017-02-05 DIAGNOSIS — G609 Hereditary and idiopathic neuropathy, unspecified: Secondary | ICD-10-CM | POA: Diagnosis not present

## 2017-02-05 DIAGNOSIS — M62838 Other muscle spasm: Secondary | ICD-10-CM | POA: Diagnosis not present

## 2017-02-05 DIAGNOSIS — M329 Systemic lupus erythematosus, unspecified: Secondary | ICD-10-CM

## 2017-02-05 DIAGNOSIS — M6249 Contracture of muscle, multiple sites: Secondary | ICD-10-CM | POA: Insufficient documentation

## 2017-02-05 DIAGNOSIS — R569 Unspecified convulsions: Secondary | ICD-10-CM

## 2017-02-05 DIAGNOSIS — Z5181 Encounter for therapeutic drug level monitoring: Secondary | ICD-10-CM

## 2017-02-05 MED ORDER — OXYCODONE-ACETAMINOPHEN 7.5-325 MG PO TABS: 1.0000 | ORAL_TABLET | Freq: Every day | ORAL | 0 refills | Status: DC | PRN

## 2017-02-05 NOTE — Progress Notes (Signed)
Subjective:    Patient ID: Autumn Davis, female    DOB: September 10, 1986, 31 y.o.   MRN: [id number]  HPI:  [name] is a 31 year old female who returns for follow up appointment for chronic pain and medication refill. She states her pain is located in her bilateral rib pain and right foot pain. Also states burning pain in her left lower extremity and bilateral feet.She rates her pain 5. Her current exercise regime is performing stretching exercises and walking.   Pain Inventory Average Pain 7 Pain Right Now 5 My pain is constant, sharp, burning, stabbing, tingling and aching  In the last 24 hours, has pain interfered with the following? General activity 6 Relation with others 6 Enjoyment of life 6 What TIME of day is your pain at its worst? evening Sleep (in general) Poor  Pain is worse with: walking, bending, sitting, inactivity, standing and some activites Pain improves with: rest, heat/ice, therapy/exercise, pacing activities, medication and TENS Relief from Meds: 4  Mobility ability to climb steps?  yes do you drive?  no  Function I need assistance with the following:  meal prep, household duties and shopping  Neuro/Psych bladder control problems bowel control problems weakness numbness tremor tingling trouble walking spasms dizziness  Prior Studies Any changes since last visit?  no  Physicians involved in your care Any changes since last visit?  no   Family History  Problem Relation Age of Onset  . Thyroid disease Mother   . Colon polyps Father   . Lung cancer Maternal Grandmother   . Cancer Paternal Grandmother     colon/pancreatiec/lymphoma  . Irritable bowel syndrome Brother    Social History   Social History  . Marital status: Married    Spouse name: N/A  . Number of children: N/A  . Years of education: N/A   Occupational History  . disabled [employer]   Social History Main Topics  . Smoking status: Never  Smoker  . Smokeless tobacco: Never Used  . Alcohol use No  . Drug use: No  . Sexual activity: Not on file   Other Topics Concern  . Not on file   Social History Narrative  . No narrative on file   Past Surgical History:  Procedure Laterality Date  . 24 HOUR PH STUDY N/A 10/29/2015   Procedure: 24 HOUR PH STUDY;  Surgeon: Iva Boop, MD;  Location: [facility];  Service: Endoscopy;  Laterality: N/A;  . COLONOSCOPY    . ESOPHAGEAL MANOMETRY N/A 10/29/2015   Procedure: ESOPHAGEAL MANOMETRY (EM);  Surgeon: Iva Boop, MD;  Location: [facility];  Service: Endoscopy;  Laterality: N/A;  . KNEE SURGERY  2002/2003   bil  . RECTAL SURGERY  correction of prolapse   2010   Past Medical History:  Diagnosis Date  . Allergy   . Arthritis    HANDS,HIPS,KNEES  . Bronchitis, chronic/intermittent 01/22/2012  . Depression 01/22/2012  . GERD (gastroesophageal reflux disease)   . Hyperlipidemia   . IBS (irritable bowel syndrome) 01/22/2012  . Lupus   . Neuromuscular disorder (HCC)   . Osteoporosis   . Seizures (HCC)   . SOB (shortness of breath)   . Thyroid disease    There were no vitals taken for this visit.  Opioid Risk Score:   Fall Risk Score:  `1  Depression screen PHQ 2/9  Depression screen [facility] 2/9 10/23/2016 07/10/2016 04/17/2016 04/08/2016 01/10/2016 06/12/2015 03/29/2015  Decreased Interest 1 0 0  1 1 1 1   Down, Depressed, Hopeless 0 0 0 0 0 0 0  PHQ - 2 Score 1 0 0 1 1 1 1   Altered sleeping - - - - - - -  Tired, decreased energy - - - - - - -  Change in appetite - - - - - - -  Feeling bad or failure about yourself  - - - - - - -  Trouble concentrating - - - - - - -  Moving slowly or fidgety/restless - - - - - - -  Suicidal thoughts - - - - - - -  PHQ-9 Score - - - - - - -    Review of Systems  Constitutional: Negative.   HENT: Negative.   Eyes: Negative.   Respiratory: Negative.   Cardiovascular: Negative.   Gastrointestinal: Negative.   Endocrine: Negative.     Genitourinary: Negative.   Musculoskeletal: Negative.   Skin: Negative.   Allergic/Immunologic: Negative.   Neurological: Negative.   Hematological: Negative.   Psychiatric/Behavioral: Negative.   All other systems reviewed and are negative.      Objective:   Physical Exam  Constitutional: She is oriented to person, place, and time. She appears well-developed and well-nourished.  HENT:  Head: Normocephalic.  Neck: Normal range of motion. Neck supple.  Cardiovascular: Normal rate and regular rhythm.   Pulmonary/Chest: Effort normal and breath sounds normal.  Musculoskeletal:  Normal Muscle Bulk and Muscle Testing Reveals: Upper Extremities: Full ROM and Muscle Strength 5/5 Thoracic Paraspinal Tenderness: T-7-T-10 Lower Extremities: Full ROM and Muscle Strength 5/5 Arises from Table with ease Narrow Based Gait  Neurological: She is alert and oriented to person, place, and time.  Skin: Skin is warm and dry.  Psychiatric: She has a normal mood and affect.  Nursing note and vitals reviewed.         Assessment & Plan:  1. Chronic seizure disorder: No seizure's. Continue Gabapentin. Neurology Following. 02/05/2017 2. Chronic muscle spasms, weakness with associated pain disorder: Continue Baclofen and MOBIC. Continue with Exercise regime. 02/05/2017 3. Chronic dysphagia: GI Following. 02/05/2017 4. Anxiety with depression : Stable. Continue Elavil. 02/05/2017 5. Fibromyalgia/ Chronic Pain: Continue with exercise and heat Therapy. 02/05/2017 Refilled: oxyCODONE 7.5/325mg  onetablet 5 times daily as needed #150. We will continue opioid monitoring program, this consists of regular clinic visits, examinations, urine drug screen, pill counts as well as use of [place] Controlled Substance Reporting System. 6. Peripheral Neuropathy: Continue Gabapentin. 02/05/2017   20 minutes of face to face patient care time was spent during this visit. All questions were encouraged  and answered.  F/U in 1 month

## 2017-02-06 ENCOUNTER — Ambulatory Visit (INDEPENDENT_AMBULATORY_CARE_PROVIDER_SITE_OTHER): Payer: BLUE CROSS/BLUE SHIELD | Admitting: Nurse Practitioner

## 2017-02-06 ENCOUNTER — Encounter: Payer: Self-pay | Admitting: Nurse Practitioner

## 2017-02-06 VITALS — BP 124/77 | HR 82 | Temp 98.4°F | Ht 68.0 in | Wt 220.0 lb

## 2017-02-06 DIAGNOSIS — M797 Fibromyalgia: Secondary | ICD-10-CM

## 2017-02-06 DIAGNOSIS — E079 Disorder of thyroid, unspecified: Secondary | ICD-10-CM | POA: Diagnosis not present

## 2017-02-06 DIAGNOSIS — G8929 Other chronic pain: Secondary | ICD-10-CM | POA: Diagnosis not present

## 2017-02-06 DIAGNOSIS — B370 Candidal stomatitis: Secondary | ICD-10-CM

## 2017-02-06 DIAGNOSIS — E559 Vitamin D deficiency, unspecified: Secondary | ICD-10-CM

## 2017-02-06 DIAGNOSIS — K581 Irritable bowel syndrome with constipation: Secondary | ICD-10-CM | POA: Diagnosis not present

## 2017-02-06 DIAGNOSIS — K219 Gastro-esophageal reflux disease without esophagitis: Secondary | ICD-10-CM

## 2017-02-06 DIAGNOSIS — R569 Unspecified convulsions: Secondary | ICD-10-CM

## 2017-02-06 DIAGNOSIS — R739 Hyperglycemia, unspecified: Secondary | ICD-10-CM

## 2017-02-06 DIAGNOSIS — I479 Paroxysmal tachycardia, unspecified: Secondary | ICD-10-CM | POA: Diagnosis not present

## 2017-02-06 DIAGNOSIS — M545 Low back pain, unspecified: Secondary | ICD-10-CM

## 2017-02-06 DIAGNOSIS — F3342 Major depressive disorder, recurrent, in full remission: Secondary | ICD-10-CM

## 2017-02-06 DIAGNOSIS — M329 Systemic lupus erythematosus, unspecified: Secondary | ICD-10-CM

## 2017-02-06 MED ORDER — MICONAZOLE 50 MG BU TABS: 1.0000 | ORAL_TABLET | Freq: Every day | BUCCAL | 2 refills | Status: DC

## 2017-02-06 NOTE — Progress Notes (Signed)
Subjective:    Patient ID: Autumn Davis, female    DOB: July 03, 1986, 31 y.o.   MRN: [id number]  Patient here today for follow up of chronic medical problems. SHe is doing well right now- has seen many specialist and they are still mot 100% sure what is causing all of her problems. * she does c/o oral yeast infection * toe nails are growing back and are starting to hurt again- got some new shoes that she hopes will help her toe nails.  Hyperlipidemia  This is a chronic problem. The current episode started more than 1 year ago. The problem is controlled. Recent lipid tests were reviewed and are normal. Factors aggravating her hyperlipidemia include fatty foods. Pertinent negatives include no chest pain. Current antihyperlipidemic treatment includes statins. The current treatment provides no improvement of lipids. There are no compliance problems.  Risk factors for coronary artery disease include dyslipidemia and obesity.  Thyroid Problem  Presents for follow-up visit. Patient reports no fatigue or palpitations. The symptoms have been stable. The treatment provided significant relief. Her past medical history is significant for hyperlipidemia.  GERD Currently managed with prilosec at this time, no side effects IBS Currently managed well, denies any complications at this visit Fibromyalgia States she is doing well, no recent flares of pain. Goes to pian management Depression Managing pretty well at this time, remains on Elavil with no side effects Lupus/Autoimmune disorder/muscle spasticity States she has good days and bad days, but managing ok at this time. Was said to have nonspecific type with multiorgan involvement. osteoporosis Probably steroid induced- they want her treated by PCP. She is already on vitamin d and calcium. Vitamin d def Vitamin d 2000iu OTC daily  Review of Systems  Constitutional: Negative for activity change, appetite change and fatigue.  HENT: Negative.     Respiratory: Negative.   Cardiovascular: Negative for chest pain and palpitations.  Gastrointestinal: Negative.   Endocrine: Negative.   Genitourinary: Negative.   Skin: Negative.   Neurological: Negative for dizziness and light-headedness.  Psychiatric/Behavioral: Negative.   All other systems reviewed and are negative.      Objective:   Physical Exam  Constitutional: She is oriented to person, place, and time. She appears well-developed and well-nourished.  HENT:  Right Ear: External ear normal.  Left Ear: External ear normal.  Nose: Nose normal.  Mouth/Throat: Oropharynx is clear and moist.  Eyes: Pupils are equal, round, and reactive to light.  Neck: Normal range of motion. Neck supple.  Cardiovascular: Normal rate, regular rhythm and normal heart sounds.   Pulmonary/Chest: Effort normal and breath sounds normal.  Abdominal: Soft. Bowel sounds are normal.  Musculoskeletal:  Walking well without cane today  Neurological: She is alert and oriented to person, place, and time. She has normal reflexes. No cranial nerve deficit.  Skin: Skin is warm and dry.  First toe on bil feet have newly growing toe nails- mild erythema- no discharge  Psychiatric: She has a normal mood and affect. Her behavior is normal. Judgment and thought content normal.   BP 124/77   Pulse 82   Temp 98.4 F (36.9 C) (Oral)   Ht 5\' 8"  (1.727 m)   Wt 220 lb (99.8 kg)   BMI 33.45 kg/m       Assessment & Plan:   1. Tachycardia, paroxysmal (HCC)   2. Gastroesophageal reflux disease without esophagitis   3. Irritable bowel syndrome with constipation   4. Thyroid disease   5. Back pain, lumbosacral  6. Recurrent major depressive disorder, in full remission (HCC)   7. Fibromyalgia   8. Chronic bilateral low back pain without sciatica   9. Systemic lupus erythematosus, unspecified SLE type, unspecified organ involvement status (HCC)   10. Seizures (HCC)   11. Vitamin D deficiency   12. Oral  candidiasis    Meds ordered this encounter  Medications  . acetaZOLAMIDE (DIAMOX) 250 MG tablet    Sig: Take 250 mg by mouth 4 (four) times daily.  . Miconazole 50 MG TABS    Sig: Place 1 tablet (50 mg total) inside cheek daily.    Dispense:  14 tablet    Refill:  2    Order Specific Question:   Supervising Provider    Answer:   Johna Sheriff [[id number]]   Force fluids Continue daily exercise Labs pending Continue epsom salt soaks for toe nails RTO in 3 months and prn  Mary-Margaret Daphine Deutscher, FNP

## 2017-02-06 NOTE — Patient Instructions (Signed)
Paronychia Paronychia is an infection of the skin. It happens near a fingernail or toenail. It may cause pain and swelling around the nail. Usually, it is not serious and it clears up with treatment. Follow these instructions at home:  Soak the fingers or toes in warm water as told by your doctor. You may be told to do this for 20 minutes, 2-3 times a day.  Keep the area dry when you are not soaking it.  Take medicines only as told by your doctor.  If you were given an antibiotic medicine, finish all of it even if you start to feel better.  Keep the affected area clean.  Do not try to drain a fluid-filled bump yourself.  Wear rubber gloves when putting your hands in water.  Wear gloves if your hands might touch cleaners or chemicals.  Follow your doctor's instructions about:  Wound care.  Bandage (dressing) changes and removal. Contact a doctor if:  Your symptoms get worse or do not improve.  You have a fever or chills.  You have redness spreading from the affected area.  You have more fluid, blood, or pus coming from the affected area.  Your finger or knuckle is swollen or is hard to move. This information is not intended to replace advice given to you by your health care provider. Make sure you discuss any questions you have with your health care provider. Document Released: 10/22/2009 Document Revised: 04/10/2016 Document Reviewed: 10/11/2014 Elsevier Interactive Patient Education  2017 Elsevier Inc.  

## 2017-02-07 LAB — CMP14+EGFR
A/G RATIO: 1.8 (ref 1.2–2.2)
ALK PHOS: 101 IU/L (ref 39–117)
ALT: 12 IU/L (ref 0–32)
AST: 12 IU/L (ref 0–40)
Albumin: 4.2 g/dL (ref 3.5–5.5)
BILIRUBIN TOTAL: 0.4 mg/dL (ref 0.0–1.2)
BUN/Creatinine Ratio: 12 (ref 9–23)
BUN: 8 mg/dL (ref 6–20)
CHLORIDE: 109 mmol/L — AB (ref 96–106)
CO2: 23 mmol/L (ref 18–29)
Calcium: 9.2 mg/dL (ref 8.7–10.2)
Creatinine, Ser: 0.69 mg/dL (ref 0.57–1.00)
GFR calc non Af Amer: 115 mL/min/{1.73_m2} (ref >59)
GFR, EST AFRICAN AMERICAN: 132 mL/min/{1.73_m2} (ref >59)
Globulin, Total: 2.4 g/dL (ref 1.5–4.5)
Glucose: 117 mg/dL — ABNORMAL HIGH (ref 65–99)
POTASSIUM: 3.4 mmol/L — AB (ref 3.5–5.2)
Sodium: 147 mmol/L — ABNORMAL HIGH (ref 134–144)
TOTAL PROTEIN: 6.6 g/dL (ref 6.0–8.5)

## 2017-02-07 LAB — LIPID PANEL
Chol/HDL Ratio: 4 ratio units (ref 0.0–4.4)
Cholesterol, Total: 168 mg/dL (ref 100–199)
HDL: 42 mg/dL (ref >39)
LDL Calculated: 73 mg/dL (ref 0–99)
TRIGLYCERIDES: 267 mg/dL — AB (ref 0–149)
VLDL CHOLESTEROL CAL: 53 mg/dL — AB (ref 5–40)

## 2017-02-09 ENCOUNTER — Other Ambulatory Visit: Payer: Self-pay | Admitting: Nurse Practitioner

## 2017-02-09 DIAGNOSIS — R739 Hyperglycemia, unspecified: Secondary | ICD-10-CM

## 2017-02-09 LAB — BAYER DCA HB A1C WAIVED: HB A1C: 5.4 % (ref <7.0)

## 2017-02-09 NOTE — Addendum Note (Signed)
Addended by: Prescott GumLAND, Trevion Hoben M on: 02/09/2017 10:03 AM   Modules accepted: Orders

## 2017-02-11 ENCOUNTER — Other Ambulatory Visit: Payer: Self-pay | Admitting: Nurse Practitioner

## 2017-02-11 MED ORDER — PREDNISONE 10 MG (21) PO TBPK
ORAL_TABLET | ORAL | 0 refills | Status: DC
Start: 2017-02-11 — End: 2017-05-14

## 2017-03-04 ENCOUNTER — Other Ambulatory Visit: Payer: Self-pay | Admitting: Nurse Practitioner

## 2017-03-12 ENCOUNTER — Telehealth: Payer: Self-pay | Admitting: Registered Nurse

## 2017-03-12 ENCOUNTER — Encounter: Payer: BLUE CROSS/BLUE SHIELD | Attending: Physical Medicine & Rehabilitation | Admitting: Registered Nurse

## 2017-03-12 ENCOUNTER — Encounter: Payer: Self-pay | Admitting: Registered Nurse

## 2017-03-12 VITALS — BP 122/85 | HR 102

## 2017-03-12 DIAGNOSIS — G894 Chronic pain syndrome: Secondary | ICD-10-CM | POA: Diagnosis not present

## 2017-03-12 DIAGNOSIS — M545 Low back pain: Secondary | ICD-10-CM | POA: Diagnosis present

## 2017-03-12 DIAGNOSIS — G609 Hereditary and idiopathic neuropathy, unspecified: Secondary | ICD-10-CM

## 2017-03-12 DIAGNOSIS — M797 Fibromyalgia: Secondary | ICD-10-CM | POA: Diagnosis present

## 2017-03-12 DIAGNOSIS — R0781 Pleurodynia: Secondary | ICD-10-CM

## 2017-03-12 DIAGNOSIS — R569 Unspecified convulsions: Secondary | ICD-10-CM

## 2017-03-12 DIAGNOSIS — M6249 Contracture of muscle, multiple sites: Secondary | ICD-10-CM | POA: Insufficient documentation

## 2017-03-12 DIAGNOSIS — Z79899 Other long term (current) drug therapy: Secondary | ICD-10-CM

## 2017-03-12 DIAGNOSIS — M62838 Other muscle spasm: Secondary | ICD-10-CM | POA: Diagnosis not present

## 2017-03-12 DIAGNOSIS — Z5181 Encounter for therapeutic drug level monitoring: Secondary | ICD-10-CM | POA: Diagnosis not present

## 2017-03-12 DIAGNOSIS — M329 Systemic lupus erythematosus, unspecified: Secondary | ICD-10-CM | POA: Insufficient documentation

## 2017-03-12 MED ORDER — OXYCODONE-ACETAMINOPHEN 7.5-325 MG PO TABS
1.0000 | ORAL_TABLET | Freq: Every day | ORAL | 0 refills | Status: DC | PRN
Start: 2017-03-12 — End: 2017-04-16

## 2017-03-12 NOTE — Progress Notes (Signed)
Subjective:    Patient ID: Autumn Davis, female    DOB: October 11, 1986, 31 y.o.   MRN: [id number]  HPI: Autumn Davis is a 31 year old female who returns for follow up appointment for chronic pain and medication refill. She states her pain is located in her bilateral rib pain and right foot pain. She rates her pain 6. Her current exercise regime is performing stretching exercises and walking. Last UDS was performed on 01/09/2017, it was consistent.   Pain Inventory Average Pain 7 Pain Right Now 6 My pain is intermittent, constant, sharp, burning, dull, stabbing, tingling and aching  In the last 24 hours, has pain interfered with the following? General activity 6 Relation with others 6 Enjoyment of life 6 What TIME of day is your pain at its worst? night Sleep (in general) Poor  Pain is worse with: walking, bending, sitting, inactivity, standing and some activites Pain improves with: rest, heat/ice, therapy/exercise, pacing activities, medication and TENS Relief from Meds: 4  Mobility walk without assistance walk with assistance use a cane use a walker ability to climb steps?  yes do you drive?  no use a wheelchair transfers alone  Function I need assistance with the following:  meal prep, household duties and shopping  Neuro/Psych bladder control problems bowel control problems weakness numbness tremor tingling trouble walking spasms dizziness  Prior Studies Any changes since last visit?  no  Physicians involved in your care Any changes since last visit?  no   Family History  Problem Relation Age of Onset  . Thyroid disease Mother   . Colon polyps Father   . Lung cancer Maternal Grandmother   . Cancer Paternal Grandmother     colon/pancreatiec/lymphoma  . Irritable bowel syndrome Brother    Social History   Social History  . Marital status: Married    Spouse name: N/A  . Number of children: N/A  . Years of education: N/A   Occupational  History  . disabled [employer]   Social History Main Topics  . Smoking status: Never Smoker  . Smokeless tobacco: Never Used  . Alcohol use No  . Drug use: No  . Sexual activity: Not Asked   Other Topics Concern  . None   Social History Narrative  . None   Past Surgical History:  Procedure Laterality Date  . 24 HOUR PH STUDY N/A 10/29/2015   Procedure: 24 HOUR PH STUDY;  Surgeon: Iva Boop, MD;  Location: [facility];  Service: Endoscopy;  Laterality: N/A;  . COLONOSCOPY    . ESOPHAGEAL MANOMETRY N/A 10/29/2015   Procedure: ESOPHAGEAL MANOMETRY (EM);  Surgeon: Iva Boop, MD;  Location: [facility];  Service: Endoscopy;  Laterality: N/A;  . KNEE SURGERY  2002/2003   bil  . RECTAL SURGERY  correction of prolapse   2010   Past Medical History:  Diagnosis Date  . Allergy   . Arthritis    HANDS,HIPS,KNEES  . Bronchitis, chronic/intermittent 01/22/2012  . Depression 01/22/2012  . GERD (gastroesophageal reflux disease)   . Hyperlipidemia   . IBS (irritable bowel syndrome) 01/22/2012  . Lupus   . Neuromuscular disorder (HCC)   . Osteoporosis   . Seizures (HCC)   . SOB (shortness of breath)   . Thyroid disease    BP 122/85 (BP Location: Left Arm, Patient Position: Sitting, Cuff Size: Normal)   Pulse (!) 102   SpO2 98%   Opioid Risk Score:   Fall Risk Score:  `  1  Depression screen PHQ 2/9  Depression screen [facility] 2/9 02/06/2017 10/23/2016 07/10/2016 04/17/2016 04/08/2016 01/10/2016 06/12/2015  Decreased Interest 0 1 0 0 1 1 1   Down, Depressed, Hopeless 0 0 0 0 0 0 0  PHQ - 2 Score 0 1 0 0 1 1 1   Altered sleeping - - - - - - -  Tired, decreased energy - - - - - - -  Change in appetite - - - - - - -  Feeling bad or failure about yourself  - - - - - - -  Trouble concentrating - - - - - - -  Moving slowly or fidgety/restless - - - - - - -  Suicidal thoughts - - - - - - -  PHQ-9 Score - - - - - - -    Review of Systems  HENT: Negative.   Eyes:  Negative.   Respiratory: Negative.   Cardiovascular: Negative.   Gastrointestinal: Negative.   Endocrine: Negative.   Genitourinary: Negative.   Musculoskeletal: Negative.   Skin: Negative.   Allergic/Immunologic: Negative.   Neurological: Positive for dizziness, tremors, weakness and numbness.  Hematological: Negative.   Psychiatric/Behavioral: Negative.   All other systems reviewed and are negative.      Objective:   Physical Exam  Constitutional: She is oriented to person, place, and time. She appears well-developed and well-nourished.  HENT:  Head: Normocephalic and atraumatic.  Neck: Normal range of motion. Neck supple.  Cardiovascular: Normal rate and regular rhythm.   Pulmonary/Chest: Effort normal and breath sounds normal.  Musculoskeletal:  Normal Muscle Bulk and Muscle Testing Reveals: Upper Extremities: Full ROM and Muscle Strength 5/5 Thoracic Paraspinal Tenderness: T-7-T-9 Lower Extremities: Full ROM and Muscle Strength 5/5 Arises from Table with ease Narrow Based Gait  Neurological: She is alert and oriented to person, place, and time.  Skin: Skin is warm and dry.  Psychiatric: She has a normal mood and affect.  Nursing note and vitals reviewed.         Assessment & Plan:  1. Chronic seizure disorder: No seizure's. Continue Gabapentin. Neurology Following. 03/12/2017 2. Chronic muscle spasms, weakness with associated pain disorder: Continue Baclofen and MOBIC. Continue with Exercise regime. 03/12/2017 3. Chronic dysphagia: GI Following. 03/12/2017 4. Anxiety with depression : Stable. Continue Elavil. 03/12/2017 5. Fibromyalgia/ Rib Pain/Chronic Pain: Continue with exercise and heat Therapy. 03/12/2017 Refilled: oxyCODONE 7.5/325mg  onetablet 5 times daily as needed #150. We will continue opioid monitoring program, this consists of regular clinic visits, examinations, urine drug screen, pill counts as well as use of [place] Controlled Substance  Reporting System. 6. Peripheral Neuropathy: Continue Gabapentin. 03/12/2017  20 minutes of face to face patient care time was spent during this visit. All questions were encouraged and answered.   F/U in 1 month

## 2017-03-12 NOTE — Telephone Encounter (Signed)
On 03/12/2017 the NCCSR was reviewed no conflict was seen on the [facility] Reporting System with multiple prescribers. [name] has a signed narcotic contract with our office. If there were any discrepancies this would have been reported to her physician.

## 2017-03-17 ENCOUNTER — Other Ambulatory Visit: Payer: Self-pay | Admitting: Nurse Practitioner

## 2017-03-17 DIAGNOSIS — K219 Gastro-esophageal reflux disease without esophagitis: Secondary | ICD-10-CM

## 2017-03-17 DIAGNOSIS — M797 Fibromyalgia: Secondary | ICD-10-CM

## 2017-03-17 DIAGNOSIS — K581 Irritable bowel syndrome with constipation: Secondary | ICD-10-CM

## 2017-03-17 DIAGNOSIS — M3219 Other organ or system involvement in systemic lupus erythematosus: Secondary | ICD-10-CM

## 2017-03-17 DIAGNOSIS — R609 Edema, unspecified: Secondary | ICD-10-CM

## 2017-03-17 DIAGNOSIS — I479 Paroxysmal tachycardia, unspecified: Secondary | ICD-10-CM

## 2017-03-17 NOTE — Telephone Encounter (Signed)
Please address 10 refills for pt

## 2017-04-08 ENCOUNTER — Encounter: Payer: BLUE CROSS/BLUE SHIELD | Admitting: Physical Medicine & Rehabilitation

## 2017-04-16 ENCOUNTER — Encounter: Payer: Self-pay | Admitting: Registered Nurse

## 2017-04-16 ENCOUNTER — Encounter: Payer: BLUE CROSS/BLUE SHIELD | Attending: Physical Medicine & Rehabilitation | Admitting: Registered Nurse

## 2017-04-16 VITALS — BP 114/79 | HR 94 | Resp 14

## 2017-04-16 DIAGNOSIS — R0781 Pleurodynia: Secondary | ICD-10-CM | POA: Diagnosis not present

## 2017-04-16 DIAGNOSIS — M62838 Other muscle spasm: Secondary | ICD-10-CM | POA: Diagnosis not present

## 2017-04-16 DIAGNOSIS — M797 Fibromyalgia: Secondary | ICD-10-CM | POA: Diagnosis not present

## 2017-04-16 DIAGNOSIS — Z5181 Encounter for therapeutic drug level monitoring: Secondary | ICD-10-CM

## 2017-04-16 DIAGNOSIS — Z79899 Other long term (current) drug therapy: Secondary | ICD-10-CM

## 2017-04-16 DIAGNOSIS — R569 Unspecified convulsions: Secondary | ICD-10-CM

## 2017-04-16 DIAGNOSIS — M6249 Contracture of muscle, multiple sites: Secondary | ICD-10-CM | POA: Diagnosis not present

## 2017-04-16 DIAGNOSIS — M329 Systemic lupus erythematosus, unspecified: Secondary | ICD-10-CM | POA: Insufficient documentation

## 2017-04-16 DIAGNOSIS — G609 Hereditary and idiopathic neuropathy, unspecified: Secondary | ICD-10-CM

## 2017-04-16 DIAGNOSIS — M545 Low back pain, unspecified: Secondary | ICD-10-CM

## 2017-04-16 DIAGNOSIS — G894 Chronic pain syndrome: Secondary | ICD-10-CM | POA: Diagnosis not present

## 2017-04-16 MED ORDER — OXYCODONE-ACETAMINOPHEN 7.5-325 MG PO TABS: 1.0000 | ORAL_TABLET | Freq: Every day | ORAL | 0 refills | Status: DC | PRN

## 2017-04-16 NOTE — Progress Notes (Signed)
Subjective:    Patient ID: Autumn Davis, female    DOB: Apr 02, 1986, 31 y.o.   MRN: [id number]  HPI: Autumn Davis is a 31year old female who returns for follow up appointment for chronic pain and medication refill. She states her pain is located in her bilateral rib pain. She rates her pain 4. Her current exercise regime is performing stretching exercises and walking.  Last UDS was performed on 01/09/2017, it was consistent.    Pain Inventory Average Pain 7 Pain Right Now 4 My pain is constant, sharp, burning and tingling  In the last 24 hours, has pain interfered with the following? General activity 6 Relation with others 6 Enjoyment of life 4 What TIME of day is your pain at its worst? evening, night  Sleep (in general) Poor  Pain is worse with: walking, bending, sitting, inactivity, standing and some activites Pain improves with: rest, heat/ice, therapy/exercise, pacing activities, medication and TENS Relief from Meds: 7  Mobility walk without assistance walk with assistance use a cane use a walker ability to climb steps?  yes do you drive?  no use a wheelchair transfers alone Do you have any goals in this area?  yes  Function I need assistance with the following:  meal prep, household duties and shopping  Neuro/Psych bladder control problems bowel control problems weakness numbness tremor tingling trouble walking spasms dizziness  Prior Studies Any changes since last visit?  no  Physicians involved in your care Any changes since last visit?  no   Family History  Problem Relation Age of Onset  . Thyroid disease Mother   . Colon polyps Father   . Lung cancer Maternal Grandmother   . Cancer Paternal Grandmother        colon/pancreatiec/lymphoma  . Irritable bowel syndrome Brother    Social History   Social History  . Marital status: Married    Spouse name: N/A  . Number of children: N/A  . Years of education: N/A    Occupational History  . disabled [employer]   Social History Main Topics  . Smoking status: Never Smoker  . Smokeless tobacco: Never Used  . Alcohol use No  . Drug use: No  . Sexual activity: Not Asked   Other Topics Concern  . None   Social History Narrative  . None   Past Surgical History:  Procedure Laterality Date  . 24 HOUR PH STUDY N/A 10/29/2015   Procedure: 24 HOUR PH STUDY;  Surgeon: Iva Boop, MD;  Location: [facility];  Service: Endoscopy;  Laterality: N/A;  . COLONOSCOPY    . ESOPHAGEAL MANOMETRY N/A 10/29/2015   Procedure: ESOPHAGEAL MANOMETRY (EM);  Surgeon: Iva Boop, MD;  Location: [facility];  Service: Endoscopy;  Laterality: N/A;  . KNEE SURGERY  2002/2003   bil  . RECTAL SURGERY  correction of prolapse   2010   Past Medical History:  Diagnosis Date  . Allergy   . Arthritis    HANDS,HIPS,KNEES  . Bronchitis, chronic/intermittent 01/22/2012  . Depression 01/22/2012  . GERD (gastroesophageal reflux disease)   . Hyperlipidemia   . IBS (irritable bowel syndrome) 01/22/2012  . Lupus   . Neuromuscular disorder (HCC)   . Osteoporosis   . Seizures (HCC)   . SOB (shortness of breath)   . Thyroid disease    BP 114/79 (BP Location: Right Arm, Patient Position: Sitting, Cuff Size: Large)   Pulse 94   Resp 14   SpO2 98%  Opioid Risk Score:   Fall Risk Score:  `1  Depression screen PHQ 2/9  Depression screen [facility] 2/9 02/06/2017 10/23/2016 07/10/2016 04/17/2016 04/08/2016 01/10/2016 06/12/2015  Decreased Interest 0 1 0 0 1 1 1   Down, Depressed, Hopeless 0 0 0 0 0 0 0  PHQ - 2 Score 0 1 0 0 1 1 1   Altered sleeping - - - - - - -  Tired, decreased energy - - - - - - -  Change in appetite - - - - - - -  Feeling bad or failure about yourself  - - - - - - -  Trouble concentrating - - - - - - -  Moving slowly or fidgety/restless - - - - - - -  Suicidal thoughts - - - - - - -  PHQ-9 Score - - - - - - -    Review of Systems  HENT:  Negative.   Eyes: Negative.   Respiratory: Negative.   Cardiovascular: Negative.   Gastrointestinal: Positive for constipation and diarrhea.       Urgency  Endocrine:       Pre-diabetic  Genitourinary: Positive for urgency.       Incontinence  Musculoskeletal: Positive for gait problem.       Spasms  Allergic/Immunologic: Negative.   Neurological: Positive for dizziness, tremors and weakness.       Tingling  Psychiatric/Behavioral: Negative.        Objective:   Physical Exam  Constitutional: She is oriented to person, place, and time. She appears well-developed and well-nourished.  HENT:  Head: Normocephalic and atraumatic.  Neck: Normal range of motion. Neck supple.  Cardiovascular: Normal rate and regular rhythm.   Pulmonary/Chest: Effort normal and breath sounds normal.  Musculoskeletal:  Normal Muscle Bulk and Muscle Testing Reveals: Upper Extremities: Full ROM and Muscle Strength 5/5 Thoracic Paraspinal Tenderness: T-7-T-9 Lower Extremities: Full ROM and Muscle Strength 5/5 Arises from table with ease Narrow Based Gait  Neurological: She is alert and oriented to person, place, and time.  Skin: Skin is warm and dry.  Psychiatric: She has a normal mood and affect.  Nursing note and vitals reviewed.         Assessment & Plan:  1. Chronic seizure disorder: No seizure's. Continue Gabapentin. Neurology Following. 04/16/2017 2. Chronic muscle spasms, weakness with associated pain disorder: Continue Baclofen and MOBIC. Continue with Exercise regime. 04/16/2017 3. Chronic dysphagia: GI Following. 04/16/2017 4. Anxiety with depression : Stable. Continue Elavil. 04/16/2017 5. Fibromyalgia/ Rib Pain/Chronic Pain: Continue with exercise and heat Therapy. 04/16/2017 Refilled: oxyCODONE 7.5/325mg  onetablet 5 times daily as needed #150. We will continue opioid monitoring program, this consists of regular clinic visits, examinations, urine drug screen, pill counts as well  as use of [place] Controlled Substance Reporting System. 6. Peripheral Neuropathy: Continue Gabapentin. 04/16/2017  20 minutes of face to face patient care time was spent during this visit. All questions were encouraged and answered.   F/U in 1 month

## 2017-04-21 LAB — TOXASSURE SELECT,+ANTIDEPR,UR

## 2017-04-22 ENCOUNTER — Telehealth: Payer: Self-pay | Admitting: Physical Medicine & Rehabilitation

## 2017-04-22 ENCOUNTER — Telehealth: Payer: Self-pay | Admitting: *Deleted

## 2017-04-22 NOTE — Telephone Encounter (Signed)
Urine drug screen for this encounter is consistent for prescribed medication 

## 2017-04-22 NOTE — Telephone Encounter (Signed)
Autumn Davis w Deutermann still has not received flash drive - I printed entire [id number] page document and put in mail to her - too large to email - no flash drive to burn CD.  Left voicemail for her

## 2017-05-05 ENCOUNTER — Encounter: Payer: BLUE CROSS/BLUE SHIELD | Admitting: Physical Medicine & Rehabilitation

## 2017-05-11 ENCOUNTER — Other Ambulatory Visit: Payer: Self-pay | Admitting: Nurse Practitioner

## 2017-05-12 ENCOUNTER — Encounter: Payer: BLUE CROSS/BLUE SHIELD | Attending: Physical Medicine & Rehabilitation | Admitting: Registered Nurse

## 2017-05-12 ENCOUNTER — Encounter: Payer: Self-pay | Admitting: Registered Nurse

## 2017-05-12 VITALS — BP 111/80 | HR 83

## 2017-05-12 DIAGNOSIS — M6249 Contracture of muscle, multiple sites: Secondary | ICD-10-CM | POA: Diagnosis not present

## 2017-05-12 DIAGNOSIS — G609 Hereditary and idiopathic neuropathy, unspecified: Secondary | ICD-10-CM

## 2017-05-12 DIAGNOSIS — M62838 Other muscle spasm: Secondary | ICD-10-CM

## 2017-05-12 DIAGNOSIS — R0781 Pleurodynia: Secondary | ICD-10-CM

## 2017-05-12 DIAGNOSIS — Z5181 Encounter for therapeutic drug level monitoring: Secondary | ICD-10-CM

## 2017-05-12 DIAGNOSIS — Z79899 Other long term (current) drug therapy: Secondary | ICD-10-CM

## 2017-05-12 DIAGNOSIS — G894 Chronic pain syndrome: Secondary | ICD-10-CM

## 2017-05-12 DIAGNOSIS — M545 Low back pain: Secondary | ICD-10-CM | POA: Insufficient documentation

## 2017-05-12 DIAGNOSIS — M329 Systemic lupus erythematosus, unspecified: Secondary | ICD-10-CM | POA: Insufficient documentation

## 2017-05-12 DIAGNOSIS — R569 Unspecified convulsions: Secondary | ICD-10-CM | POA: Diagnosis not present

## 2017-05-12 DIAGNOSIS — M797 Fibromyalgia: Secondary | ICD-10-CM | POA: Diagnosis not present

## 2017-05-12 MED ORDER — OXYCODONE-ACETAMINOPHEN 7.5-325 MG PO TABS: 1.0000 | ORAL_TABLET | Freq: Every day | ORAL | 0 refills | Status: DC | PRN

## 2017-05-12 NOTE — Progress Notes (Signed)
Subjective:    Patient ID: Autumn Davis, female    DOB: 1986/03/09, 31 y.o.   MRN: [id number]  HPI: Autumn Davis is a 31year old female who returns for follow up appointment for chronic pain and medication refill. She states her pain is located in her bilateral rib pain. Also states she has a migraine since last week states it is resolving. She rates her pain 4. Her current exercise regime is performing stretching exercises and walking.  Last UDS was performed on 04/16/2017, it was consistent.   Pain Inventory Average Pain 7 Pain Right Now 4 My pain is constant, sharp, burning, stabbing and tingling  In the last 24 hours, has pain interfered with the following? General activity 7 Relation with others 4 Enjoyment of life 5 What TIME of day is your pain at its worst? evening Sleep (in general) Poor  Pain is worse with: walking, bending, sitting, inactivity, standing and some activites Pain improves with: rest, heat/ice, therapy/exercise, pacing activities, medication and TENS Relief from Meds: 4  Mobility walk without assistance walk with assistance use a cane use a walker ability to climb steps?  yes do you drive?  no use a wheelchair transfers alone  Function Do you have any goals in this area?  yes  Neuro/Psych bladder control problems bowel control problems weakness numbness tremor tingling trouble walking spasms dizziness  Prior Studies Any changes since last visit?  no  Physicians involved in your care Any changes since last visit?  no   Family History  Problem Relation Age of Onset  . Thyroid disease Mother   . Colon polyps Father   . Lung cancer Maternal Grandmother   . Cancer Paternal Grandmother        colon/pancreatiec/lymphoma  . Irritable bowel syndrome Brother    Social History   Social History  . Marital status: Married    Spouse name: N/A  . Number of children: N/A  . Years of education: N/A   Occupational History   . disabled [employer]   Social History Main Topics  . Smoking status: Never Smoker  . Smokeless tobacco: Never Used  . Alcohol use No  . Drug use: No  . Sexual activity: Not on file   Other Topics Concern  . Not on file   Social History Narrative  . No narrative on file   Past Surgical History:  Procedure Laterality Date  . 24 HOUR PH STUDY N/A 10/29/2015   Procedure: 24 HOUR PH STUDY;  Surgeon: Iva Boop, MD;  Location: [facility];  Service: Endoscopy;  Laterality: N/A;  . COLONOSCOPY    . ESOPHAGEAL MANOMETRY N/A 10/29/2015   Procedure: ESOPHAGEAL MANOMETRY (EM);  Surgeon: Iva Boop, MD;  Location: [facility];  Service: Endoscopy;  Laterality: N/A;  . KNEE SURGERY  2002/2003   bil  . RECTAL SURGERY  correction of prolapse   2010   Past Medical History:  Diagnosis Date  . Allergy   . Arthritis    HANDS,HIPS,KNEES  . Bronchitis, chronic/intermittent 01/22/2012  . Depression 01/22/2012  . GERD (gastroesophageal reflux disease)   . Hyperlipidemia   . IBS (irritable bowel syndrome) 01/22/2012  . Lupus   . Neuromuscular disorder (HCC)   . Osteoporosis   . Seizures (HCC)   . SOB (shortness of breath)   . Thyroid disease    There were no vitals taken for this visit.  Opioid Risk Score:   Fall Risk Score:  `1  Depression screen PHQ 2/9  Depression screen [facility] 2/9 02/06/2017 10/23/2016 07/10/2016 04/17/2016 04/08/2016 01/10/2016 06/12/2015  Decreased Interest 0 1 0 0 1 1 1   Down, Depressed, Hopeless 0 0 0 0 0 0 0  PHQ - 2 Score 0 1 0 0 1 1 1   Altered sleeping - - - - - - -  Tired, decreased energy - - - - - - -  Change in appetite - - - - - - -  Feeling bad or failure about yourself  - - - - - - -  Trouble concentrating - - - - - - -  Moving slowly or fidgety/restless - - - - - - -  Suicidal thoughts - - - - - - -  PHQ-9 Score - - - - - - -     Review of Systems  Constitutional: Negative.   HENT: Negative.   Eyes: Negative.   Respiratory:  Negative.   Cardiovascular: Negative.   Gastrointestinal: Negative.   Endocrine: Negative.   Genitourinary: Negative.   Musculoskeletal: Negative.   Skin: Negative.   Allergic/Immunologic: Negative.   Neurological: Negative.   Hematological: Negative.   Psychiatric/Behavioral: Negative.   All other systems reviewed and are negative.      Objective:   Physical Exam  Constitutional: She is oriented to person, place, and time. She appears well-developed and well-nourished.  HENT:  Head: Normocephalic and atraumatic.  Neck: Normal range of motion. Neck supple.  Cardiovascular: Normal rate and regular rhythm.   Pulmonary/Chest: Effort normal and breath sounds normal.  Musculoskeletal:  Normal Muscle Bulk and Muscle Testing Reveals: Upper Extremities: Full ROM and Muscle Strength 5/5 Bilateral Thoracic Paraspinal Tenderness: T-5-T-7 Lower Extremities: Full ROM and Muscle Strength 5/5 Arises from Table with ease Narrow Based Gait  Neurological: She is alert and oriented to person, place, and time.  Skin: Skin is warm and dry.  Psychiatric: She has a normal mood and affect.  Nursing note and vitals reviewed.            Assessment & Plan:  1. Chronic seizure disorder: No seizure's. Continue Gabapentin. Neurology Following. 05/12/2017 2. Chronic muscle spasms, weakness with associated pain disorder: Continue Baclofen and MOBIC. Continue with Exercise regime. 05/12/2017 3. Chronic dysphagia: GI Following. 05/12/2017 4. Anxiety with depression : Stable. Continue Elavil. 05/12/2017 5. Fibromyalgia/ Rib Pain/Chronic Pain: Continue with exercise and heat Therapy. 05/12/2017 Refilled: oxyCODONE 7.5/325mg  onetablet 5 times daily as needed #150. We will continue opioid monitoring program, this consists of regular clinic visits, examinations, urine drug screen, pill counts as well as use of [place] Controlled Substance Reporting System. 6. Peripheral Neuropathy: Continue  Gabapentin. 05/12/2017  20 minutes of face to face patient care time was spent during this visit. All questions were encouraged and answered.   F/U in 1 month

## 2017-05-14 ENCOUNTER — Encounter: Payer: Self-pay | Admitting: Nurse Practitioner

## 2017-05-14 ENCOUNTER — Ambulatory Visit (INDEPENDENT_AMBULATORY_CARE_PROVIDER_SITE_OTHER): Payer: BLUE CROSS/BLUE SHIELD | Admitting: Nurse Practitioner

## 2017-05-14 VITALS — BP 125/81 | HR 89 | Temp 98.3°F | Ht 68.0 in | Wt 221.0 lb

## 2017-05-14 DIAGNOSIS — E782 Mixed hyperlipidemia: Secondary | ICD-10-CM

## 2017-05-14 DIAGNOSIS — E559 Vitamin D deficiency, unspecified: Secondary | ICD-10-CM

## 2017-05-14 DIAGNOSIS — E079 Disorder of thyroid, unspecified: Secondary | ICD-10-CM | POA: Diagnosis not present

## 2017-05-14 DIAGNOSIS — K581 Irritable bowel syndrome with constipation: Secondary | ICD-10-CM

## 2017-05-14 DIAGNOSIS — R569 Unspecified convulsions: Secondary | ICD-10-CM | POA: Diagnosis not present

## 2017-05-14 DIAGNOSIS — K219 Gastro-esophageal reflux disease without esophagitis: Secondary | ICD-10-CM

## 2017-05-14 DIAGNOSIS — M329 Systemic lupus erythematosus, unspecified: Secondary | ICD-10-CM | POA: Diagnosis not present

## 2017-05-14 DIAGNOSIS — D8989 Other specified disorders involving the immune mechanism, not elsewhere classified: Secondary | ICD-10-CM | POA: Diagnosis not present

## 2017-05-14 DIAGNOSIS — I479 Paroxysmal tachycardia, unspecified: Secondary | ICD-10-CM

## 2017-05-14 DIAGNOSIS — M797 Fibromyalgia: Secondary | ICD-10-CM | POA: Diagnosis not present

## 2017-05-14 DIAGNOSIS — F3342 Major depressive disorder, recurrent, in full remission: Secondary | ICD-10-CM | POA: Diagnosis not present

## 2017-05-14 MED ORDER — ATORVASTATIN CALCIUM 40 MG PO TABS: ORAL_TABLET | ORAL | 1 refills | Status: DC

## 2017-05-14 NOTE — Progress Notes (Signed)
Subjective:    Patient ID: Autumn Davis, female    DOB: January 26, 1986, 31 y.o.   MRN: [id number]   HPI Patient comes in today for follow up of chronic medical problems.SHe is doing quite well. No major changes since last visit. She has seen her neurologist and rheumatologist since lst visit and they have made no changes to her meds. She continues to see pain management for her pain. She recently got full custody of her foster child and that has relieved a lot of stress she has been having. She stays very active despite her pain. She has not had a seizure in over a year. She has no new complaints today.  Patient Active Problem List   Diagnosis Date Noted  . Vitamin D deficiency 10/23/2016  . Other osteoporosis without current pathological fracture 10/23/2016  . Dysphagia   . Rectal bleeding 07/19/2015  . Tachycardia, paroxysmal (HCC) 12/29/2014  . 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 Lupus (systemic lupus erythematosus) (HCC) 08/29/2014  . Dyspnea 04/10/2014  . Fibromyalgia 12/14/2013  . Muscle spasticity 11/14/2013  . Thoracic back pain 11/14/2013  . Lumbago 11/14/2013  . Hyperlipidemia 08/26/2012  . IBS (irritable bowel syndrome) 01/22/2012  . Depression 01/22/2012  . Bronchitis, chronic/intermittent 01/22/2012  . Back pain, lumbosacral 01/22/2012  . GERD (gastroesophageal reflux disease)   . Seizures (HCC)   . Thyroid disease    Outpatient Encounter Prescriptions as of 05/14/2017  Medication Sig  . acetaZOLAMIDE (DIAMOX) 250 MG tablet TAKE 1 TABLET EVERY MORNING, TAKE 1 TABLET AT 2PM, AND TAKE 2 TABLETSAT BEDTIME  . albuterol (PROVENTIL HFA;VENTOLIN HFA) 108 (90 BASE) MCG/ACT inhaler Inhale 2 puffs into the lungs every 6 (six) hours as needed for wheezing or shortness of breath.  Marland Kitchen amitriptyline (ELAVIL) 75 MG tablet TAKE ONE TABLET DAILY AT BEDTIME  . atorvastatin (LIPITOR) 40 MG tablet TAKE ONE (1) TABLET EACH DAY  . baclofen (LIORESAL) 20 MG tablet TAKE ONE  TABLET FOUR TIMES DAILY  . Beclomethasone Dipropionate (QNASL CHILDRENS) 40 MCG/ACT AERS Place into the nose.  . Calcium-Magnesium-Vitamin D (CALCIUM 1200+D3 PO) Take 1 tablet by mouth daily.  . cetirizine (ZYRTEC) 10 MG tablet Take 10 mg by mouth daily.  . Cholecalciferol (VITAMIN D) 2000 UNITS tablet Take 2,000 Units by mouth daily.  . cycloSPORINE (RESTASIS) 0.05 % ophthalmic emulsion Place 1 drop into both eyes 2 (two) times daily.  Marland Kitchen DEXILANT 60 MG capsule TAKE ONE (1) CAPSULE EACH DAY  . docusate sodium (COLACE) 100 MG capsule Take 100 mg by mouth 2 (two) times daily. PRN  . EPINEPHRINE 0.3 mg/0.3 mL IJ SOAJ injection USE AS DIRECTED  . FORTEO 600 MCG/2.4ML SOLN Inject 20 mcg into the skin at bedtime.  . furosemide (LASIX) 20 MG tablet TAKE ONE (1) TABLET EACH DAY  . gabapentin (NEURONTIN) 600 MG tablet TAKE ONE TABLET FOUR TIMES DAILY  . ipratropium-albuterol (DUONEB) 0.5-2.5 (3) MG/3ML SOLN Take 3 mLs by nebulization every 4 (four) hours as needed.  Marland Kitchen L-Methylfolate-Algae (L-METHYLFOLATE FORTE) 15-90.314 MG CAPS TAKE ONE (1) CAPSULE EACH DAY  . medroxyPROGESTERone (DEPO-PROVERA) 150 MG/ML injection Inject 150 mg into the muscle as directed. Every 2 months  . meloxicam (MOBIC) 15 MG tablet TAKE ONE (1) TABLET EACH DAY  . metoprolol succinate (TOPROL-XL) 25 MG 24 hr tablet TAKE ONE (1) TABLET EACH DAY  . metoprolol succinate (TOPROL-XL) 25 MG 24 hr tablet TAKE ONE (1) TABLET EACH DAY  . Miconazole 50 MG TABS Place 1 tablet (50 mg  total) inside cheek daily.  . montelukast (SINGULAIR) 10 MG tablet TAKE ONE TABLET DAILY AT BEDTIME  . nystatin (MYCOSTATIN) 100000 UNIT/ML suspension   . nystatin-triamcinolone ointment (MYCOLOG) Apply 1 application topically 2 (two) times daily.  . Omega 3 1000 MG CAPS Take 1 capsule by mouth daily.  Marland Kitchen oxyCODONE-acetaminophen (PERCOCET) 7.5-325 MG tablet Take 1 tablet by mouth 5 (five) times daily as needed for severe pain. No More Than 5 a day  . predniSONE  (DELTASONE) 5 MG tablet TAKE ONE TABLET EVERY MORNING  . predniSONE (STERAPRED UNI-PAK 21 TAB) 10 MG (21) TBPK tablet As directed x 6 days  . promethazine (PHENERGAN) 12.5 MG tablet Take 1 tablet (12.5 mg total) by mouth every 8 (eight) hours as needed for nausea or vomiting.  . tamsulosin (FLOMAX) 0.4 MG CAPS capsule TAKE ONE (1) CAPSULE EACH DAY  . vitamin E (VITAMIN E) 400 UNIT capsule Take 400 Units by mouth daily.   No facility-administered encounter medications on file as of 05/14/2017.      Review of Systems  Constitutional: Positive for activity change (more active) and fatigue. Negative for appetite change.  HENT: Negative.   Respiratory: Positive for shortness of breath (occasionally).   Cardiovascular: Positive for palpitations (occasional).  Gastrointestinal: Positive for constipation and diarrhea. Negative for nausea and vomiting.  Genitourinary: Negative.   Neurological: Positive for dizziness, seizures (not recently) and weakness.  Psychiatric/Behavioral: Negative.        Objective:   Physical Exam  Constitutional: She is oriented to person, place, and time. She appears well-developed and well-nourished.  HENT:  Nose: Nose normal.  Mouth/Throat: Oropharynx is clear and moist.  Eyes: EOM are normal.  Neck: Trachea normal, normal range of motion and full passive range of motion without pain. Neck supple. No JVD present. Carotid bruit is not present. No thyromegaly present.  Cardiovascular: Normal rate, regular rhythm, normal heart sounds and intact distal pulses.  Exam reveals no gallop and no friction rub.   No murmur heard. Pulmonary/Chest: Effort normal and breath sounds normal.  Abdominal: Soft. Bowel sounds are normal. She exhibits no distension and no mass. There is tenderness (diffuse on deep palpation).  Musculoskeletal: Normal range of motion.  Gait is steady without cane or walker for support  Lymphadenopathy:    She has no cervical adenopathy.    Neurological: She is alert and oriented to person, place, and time. She has normal reflexes. No cranial nerve deficit.  Skin: Skin is warm and dry.  Psychiatric: She has a normal mood and affect. Her behavior is normal. Judgment and thought content normal.    BP 125/81   Pulse 89   Temp 98.3 F (36.8 C) (Oral)   Ht 5\' 8"  (1.727 m)   Wt 221 lb (100.2 kg)   BMI 33.60 kg/m       Assessment & Plan:   1. Tachycardia, paroxysmal (HCC)   2. Irritable bowel syndrome with constipation   3. Gastroesophageal reflux disease without esophagitis   4. Thyroid disease   5. Vitamin D deficiency   6. Seizures (HCC)   7. Systemic lupus erythematosus, unspecified SLE type, unspecified organ involvement status (HCC)   8. Inflammatory autoimmune disorder (HCC)   9. Mixed hyperlipidemia   10. Fibromyalgia   11. Recurrent major depressive disorder, in full remission (HCC)    Meds ordered this encounter  Medications  . atorvastatin (LIPITOR) 40 MG tablet    Sig: TAKE ONE (1) TABLET EACH DAY    Dispense:  90 tablet    Refill:  1    Order Specific Question:   Supervising Provider    Answer:   Oswaldo Done, CAROL L [[id number]]   Orders Placed This Encounter  Procedures  . CMP14+EGFR  . CBC with Differential/Platelet  . Lipid panel  . Thyroid Panel With TSH   Keep follow up appointments with specialist Labs pending Health maintenance reviewed Diet and exercise encouraged Continue all meds Follow up  In 3 months Mary-Margaret Daphine Deutscher, FNP

## 2017-05-15 LAB — CBC WITH DIFFERENTIAL/PLATELET
BASOS: 1 %
Basophils Absolute: 0.1 10*3/uL (ref 0.0–0.2)
EOS (ABSOLUTE): 0.1 10*3/uL (ref 0.0–0.4)
Eos: 1 %
HEMOGLOBIN: 11.7 g/dL (ref 11.1–15.9)
Hematocrit: 35.7 % (ref 34.0–46.6)
IMMATURE GRANS (ABS): 0 10*3/uL (ref 0.0–0.1)
Immature Granulocytes: 0 %
LYMPHS: 27 %
Lymphocytes Absolute: 3 10*3/uL (ref 0.7–3.1)
MCH: 27.9 pg (ref 26.6–33.0)
MCHC: 32.8 g/dL (ref 31.5–35.7)
MCV: 85 fL (ref 79–97)
Monocytes Absolute: 0.6 10*3/uL (ref 0.1–0.9)
Monocytes: 5 %
NEUTROS ABS: 7.3 10*3/uL — AB (ref 1.4–7.0)
Neutrophils: 66 %
PLATELETS: 308 10*3/uL (ref 150–379)
RBC: 4.2 x10E6/uL (ref 3.77–5.28)
RDW: 14.9 % (ref 12.3–15.4)
WBC: 11.1 10*3/uL — ABNORMAL HIGH (ref 3.4–10.8)

## 2017-05-15 LAB — LIPID PANEL
Chol/HDL Ratio: 3.3 ratio (ref 0.0–4.4)
Cholesterol, Total: 191 mg/dL (ref 100–199)
HDL: 58 mg/dL (ref >39)
LDL CALC: 87 mg/dL (ref 0–99)
Triglycerides: 230 mg/dL — ABNORMAL HIGH (ref 0–149)
VLDL CHOLESTEROL CAL: 46 mg/dL — AB (ref 5–40)

## 2017-05-15 LAB — CMP14+EGFR
A/G RATIO: 1.9 (ref 1.2–2.2)
ALBUMIN: 4.5 g/dL (ref 3.5–5.5)
ALT: 17 IU/L (ref 0–32)
AST: 17 IU/L (ref 0–40)
Alkaline Phosphatase: 119 IU/L — ABNORMAL HIGH (ref 39–117)
BUN / CREAT RATIO: 11 (ref 9–23)
BUN: 8 mg/dL (ref 6–20)
Bilirubin Total: 0.2 mg/dL (ref 0.0–1.2)
CALCIUM: 9.3 mg/dL (ref 8.7–10.2)
CO2: 21 mmol/L (ref 20–29)
Chloride: 109 mmol/L — ABNORMAL HIGH (ref 96–106)
Creatinine, Ser: 0.72 mg/dL (ref 0.57–1.00)
GFR, EST AFRICAN AMERICAN: 127 mL/min/{1.73_m2} (ref >59)
GFR, EST NON AFRICAN AMERICAN: 110 mL/min/{1.73_m2} (ref >59)
GLOBULIN, TOTAL: 2.4 g/dL (ref 1.5–4.5)
Glucose: 101 mg/dL — ABNORMAL HIGH (ref 65–99)
Potassium: 3.3 mmol/L — ABNORMAL LOW (ref 3.5–5.2)
Sodium: 146 mmol/L — ABNORMAL HIGH (ref 134–144)
TOTAL PROTEIN: 6.9 g/dL (ref 6.0–8.5)

## 2017-05-15 LAB — THYROID PANEL WITH TSH
Free Thyroxine Index: 1.3 (ref 1.2–4.9)
T3 Uptake Ratio: 24 % (ref 24–39)
T4 TOTAL: 5.5 ug/dL (ref 4.5–12.0)
TSH: 1.61 u[IU]/mL (ref 0.450–4.500)

## 2017-06-10 ENCOUNTER — Ambulatory Visit: Payer: BLUE CROSS/BLUE SHIELD | Admitting: Physical Medicine & Rehabilitation

## 2017-06-10 ENCOUNTER — Encounter
Payer: BLUE CROSS/BLUE SHIELD | Attending: Physical Medicine & Rehabilitation | Admitting: Physical Medicine & Rehabilitation

## 2017-06-10 ENCOUNTER — Encounter: Payer: Self-pay | Admitting: Physical Medicine & Rehabilitation

## 2017-06-10 VITALS — BP 127/86 | HR 102

## 2017-06-10 DIAGNOSIS — M545 Low back pain, unspecified: Secondary | ICD-10-CM

## 2017-06-10 DIAGNOSIS — M6249 Contracture of muscle, multiple sites: Secondary | ICD-10-CM | POA: Insufficient documentation

## 2017-06-10 DIAGNOSIS — R569 Unspecified convulsions: Secondary | ICD-10-CM | POA: Insufficient documentation

## 2017-06-10 DIAGNOSIS — M62838 Other muscle spasm: Secondary | ICD-10-CM | POA: Diagnosis not present

## 2017-06-10 DIAGNOSIS — M329 Systemic lupus erythematosus, unspecified: Secondary | ICD-10-CM | POA: Insufficient documentation

## 2017-06-10 DIAGNOSIS — D8989 Other specified disorders involving the immune mechanism, not elsewhere classified: Secondary | ICD-10-CM | POA: Diagnosis not present

## 2017-06-10 DIAGNOSIS — M546 Pain in thoracic spine: Secondary | ICD-10-CM | POA: Diagnosis not present

## 2017-06-10 DIAGNOSIS — M797 Fibromyalgia: Secondary | ICD-10-CM | POA: Diagnosis not present

## 2017-06-10 DIAGNOSIS — G8929 Other chronic pain: Secondary | ICD-10-CM | POA: Diagnosis not present

## 2017-06-10 MED ORDER — OXYCODONE-ACETAMINOPHEN 7.5-325 MG PO TABS: 1.0000 | ORAL_TABLET | Freq: Every day | ORAL | 0 refills | Status: DC | PRN

## 2017-06-10 NOTE — Patient Instructions (Signed)
WORK ON REGULAR AMBULATION AND WEIGHT BEARING.   PLEASE FEEL FREE TO CALL OUR OFFICE WITH ANY PROBLEMS OR QUESTIONS [phone])

## 2017-06-10 NOTE — Progress Notes (Addendum)
Subjective:    Patient ID: Autumn Davis, female    DOB: 14-Sep-1986, 31 y.o.   MRN: [id number]  HPI   Autumn Davis is here in follow up of her chronic pain. She continues to follow up with rheumatology and is stable. She recently started on forteo for bone density. She remains on prednisone for undiagnosed autoimmune disorder.   Otherwise her pain levels have been fairly stable and manageable. She is using 5 percocets per day on avg. She is trying to walk and be more active. She better knows her limits now as well.   Pain Inventory Average Pain 8 Pain Right Now 6 My pain is constant, sharp, burning, stabbing, tingling and aching  In the last 24 hours, has pain interfered with the following? General activity 7 Relation with others 5 Enjoyment of life 5 What TIME of day is your pain at its worst? evening Sleep (in general) Poor  Pain is worse with: walking, bending, sitting, inactivity and standing Pain improves with: rest, heat/ice, therapy/exercise, medication and TENS Relief from Meds: .  Mobility walk without assistance ability to climb steps?  yes do you drive?  no  Function disabled: date disabled 2012 I need assistance with the following:  meal prep, household duties and shopping  Neuro/Psych bladder control problems bowel control problems weakness numbness tremor tingling trouble walking spasms dizziness  Prior Studies Any changes since last visit?  no  Physicians involved in your care Any changes since last visit?  no   Family History  Problem Relation Age of Onset  . Thyroid disease Mother   . Colon polyps Father   . Lung cancer Maternal Grandmother   . Cancer Paternal Grandmother        colon/pancreatiec/lymphoma  . Irritable bowel syndrome Brother    Social History   Social History  . Marital status: Married    Spouse name: N/A  . Number of children: N/A  . Years of education: N/A   Occupational History  . disabled [employer]    Social History Main Topics  . Smoking status: Never Smoker  . Smokeless tobacco: Never Used  . Alcohol use No  . Drug use: No  . Sexual activity: Not on file   Other Topics Concern  . Not on file   Social History Narrative  . No narrative on file   Past Surgical History:  Procedure Laterality Date  . 24 HOUR PH STUDY N/A 10/29/2015   Procedure: 24 HOUR PH STUDY;  Surgeon: Iva Boop, MD;  Location: [facility];  Service: Endoscopy;  Laterality: N/A;  . COLONOSCOPY    . ESOPHAGEAL MANOMETRY N/A 10/29/2015   Procedure: ESOPHAGEAL MANOMETRY (EM);  Surgeon: Iva Boop, MD;  Location: [facility];  Service: Endoscopy;  Laterality: N/A;  . KNEE SURGERY  2002/2003   bil  . RECTAL SURGERY  correction of prolapse   2010   Past Medical History:  Diagnosis Date  . Allergy   . Arthritis    HANDS,HIPS,KNEES  . Bronchitis, chronic/intermittent 01/22/2012  . Depression 01/22/2012  . GERD (gastroesophageal reflux disease)   . Hyperlipidemia   . IBS (irritable bowel syndrome) 01/22/2012  . Lupus   . Neuromuscular disorder (HCC)   . Osteoporosis   . Seizures (HCC)   . SOB (shortness of breath)   . Thyroid disease    There were no vitals taken for this visit.  Opioid Risk Score:   Fall Risk Score:  `1  Depression screen [facility] 2/9  Depression screen [facility] 2/9 05/14/2017 02/06/2017 10/23/2016 07/10/2016 04/17/2016 04/08/2016 01/10/2016  Decreased Interest 0 0 1 0 0 1 1  Down, Depressed, Hopeless 0 0 0 0 0 0 0  PHQ - 2 Score 0 0 1 0 0 1 1  Altered sleeping - - - - - - -  Tired, decreased energy - - - - - - -  Change in appetite - - - - - - -  Feeling bad or failure about yourself  - - - - - - -  Trouble concentrating - - - - - - -  Moving slowly or fidgety/restless - - - - - - -  Suicidal thoughts - - - - - - -  PHQ-9 Score - - - - - - -  \  Review of Systems  Constitutional: Negative.   HENT: Negative.   Eyes: Negative.   Respiratory: Negative.   Cardiovascular: Negative.    Gastrointestinal: Negative.   Endocrine: Negative.   Genitourinary: Negative.   Musculoskeletal: Negative.   Skin: Negative.   Allergic/Immunologic: Negative.   Neurological: Negative.   Hematological: Negative.   Psychiatric/Behavioral: Negative.   All other systems reviewed and are negative.      Objective:   Physical Exam  General: Alert and oriented x 3, No apparent distress. Has gained some weight.  HEENT: PERRl, EOMI  Neck:Supple without JVD or lymphadenopathy  Heart: RRR  Chest:clear. Normal effort Abdomen:NT, soft.  Extremities:No clubbing, cyanosis, or edema. Pulses are 2+  Skin:Clean and intact without signs of breakdown. She has some mottling over the skin of the lower back---no rash,breakdown, non-tender.  Neuro:  5/5. No consistent sensory loss. cognitvely intact. No CN findings.  Musculoskeletal:general trunk pain .  Psych:appears alert. Pleasant and appropriate.   Assessment & Plan:  1. Chronic seizure disorder---? myoclonic jerks/myotonia 2. 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  3. Chronic dysphagia  4. Right low back/flank pain  5. Fibromyalgia syndrome  6. Anxiety with depression  7. Pulmonary function abnormalities., ?CO2 retention? ---recent normal ECHO from 09/15/14 8. Osteoporosis:    Plan:  1. Continue Elavil at 75mg  qhs  2. Maintain baclofen to 20mg  for muscle spasms. Discussed exercise and ROM.  3. Rheum plan as recommended 4. Continue meloxicam 15mg  daily  5. Oxycodone 7.5mg  fr breakthrough pain #150.  6. Continue Gabapentin 600mg  QID for seizures, spasms, neuro pain---this appears to have helped.  7. Continue lidoderm patches for trunk/back pain.  8. Again reviewed the importance of weight bearing exercise and posture as they pertain to her bone density. Forteo per primary   Follow up with NP or me in about one month. 15 minutes of face to face  patient care time were spent during this visit. All questions were encouraged and answered.

## 2017-06-24 ENCOUNTER — Other Ambulatory Visit: Payer: Self-pay | Admitting: Nurse Practitioner

## 2017-06-24 DIAGNOSIS — M797 Fibromyalgia: Secondary | ICD-10-CM

## 2017-06-24 DIAGNOSIS — M3219 Other organ or system involvement in systemic lupus erythematosus: Secondary | ICD-10-CM

## 2017-06-25 NOTE — Telephone Encounter (Signed)
Last seen 05/14/17  MMM

## 2017-07-03 ENCOUNTER — Other Ambulatory Visit: Payer: Self-pay | Admitting: Nurse Practitioner

## 2017-07-14 ENCOUNTER — Encounter: Payer: BLUE CROSS/BLUE SHIELD | Attending: Physical Medicine & Rehabilitation | Admitting: Registered Nurse

## 2017-07-14 ENCOUNTER — Other Ambulatory Visit: Payer: Self-pay | Admitting: Nurse Practitioner

## 2017-07-14 ENCOUNTER — Encounter: Payer: Self-pay | Admitting: Registered Nurse

## 2017-07-14 VITALS — BP 119/83 | HR 89

## 2017-07-14 DIAGNOSIS — M62838 Other muscle spasm: Secondary | ICD-10-CM

## 2017-07-14 DIAGNOSIS — G894 Chronic pain syndrome: Secondary | ICD-10-CM

## 2017-07-14 DIAGNOSIS — G8929 Other chronic pain: Secondary | ICD-10-CM

## 2017-07-14 DIAGNOSIS — Z79899 Other long term (current) drug therapy: Secondary | ICD-10-CM

## 2017-07-14 DIAGNOSIS — G609 Hereditary and idiopathic neuropathy, unspecified: Secondary | ICD-10-CM | POA: Diagnosis not present

## 2017-07-14 DIAGNOSIS — M6249 Contracture of muscle, multiple sites: Secondary | ICD-10-CM | POA: Insufficient documentation

## 2017-07-14 DIAGNOSIS — M329 Systemic lupus erythematosus, unspecified: Secondary | ICD-10-CM | POA: Diagnosis not present

## 2017-07-14 DIAGNOSIS — R569 Unspecified convulsions: Secondary | ICD-10-CM

## 2017-07-14 DIAGNOSIS — Z5181 Encounter for therapeutic drug level monitoring: Secondary | ICD-10-CM | POA: Diagnosis not present

## 2017-07-14 DIAGNOSIS — M546 Pain in thoracic spine: Secondary | ICD-10-CM

## 2017-07-14 DIAGNOSIS — M545 Low back pain: Secondary | ICD-10-CM | POA: Diagnosis not present

## 2017-07-14 DIAGNOSIS — K581 Irritable bowel syndrome with constipation: Secondary | ICD-10-CM

## 2017-07-14 DIAGNOSIS — M797 Fibromyalgia: Secondary | ICD-10-CM

## 2017-07-14 DIAGNOSIS — D8989 Other specified disorders involving the immune mechanism, not elsewhere classified: Secondary | ICD-10-CM

## 2017-07-14 MED ORDER — OXYCODONE-ACETAMINOPHEN 7.5-325 MG PO TABS: 1.0000 | ORAL_TABLET | Freq: Every day | ORAL | 0 refills | Status: DC | PRN

## 2017-07-14 NOTE — Progress Notes (Signed)
Subjective:    Patient ID: Autumn Davis, female    DOB: 1986/01/03, 31 y.o.   MRN: [id number]  HPI: Autumn Davis is a 31year old female who returns for follow up appointment for chronic pain and medication refill. She states her pain is located in her bilateral ribs. She rates her pain 8 . Her current exercise regime is performing stretching exercises and walking.  Last UDS was performed on 04/16/2017, it was consistent.   Pain Inventory Average Pain 6 Pain Right Now 8 My pain is constant, sharp, burning, dull, stabbing, tingling and aching  In the last 24 hours, has pain interfered with the following? General activity 6 Relation with others 6 Enjoyment of life 6 What TIME of day is your pain at its worst? evening night Sleep (in general) Poor  Pain is worse with: walking, bending, sitting, inactivity and standing Pain improves with: rest, heat/ice, therapy/exercise, pacing activities, medication and TENS Relief from Meds: na  Mobility walk without assistance walk with assistance use a cane use a walker ability to climb steps?  yes do you drive?  no use a wheelchair transfers alone  Function I need assistance with the following:  meal prep, household duties and shopping  Neuro/Psych bladder control problems bowel control problems weakness numbness tremor tingling trouble walking spasms dizziness  Prior Studies Any changes since last visit?  no  Physicians involved in your care Any changes since last visit?  no   Family History  Problem Relation Age of Onset  . Thyroid disease Mother   . Colon polyps Father   . Lung cancer Maternal Grandmother   . Cancer Paternal Grandmother        colon/pancreatiec/lymphoma  . Irritable bowel syndrome Brother    Social History   Social History  . Marital status: Married    Spouse name: N/A  . Number of children: N/A  . Years of education: N/A   Occupational History  . disabled [employer]   Social History Main Topics  . Smoking status: Never Smoker  . Smokeless tobacco: Never Used  . Alcohol use No  . Drug use: No  . Sexual activity: Not Asked   Other Topics Concern  . None   Social History Narrative  . None   Past Surgical History:  Procedure Laterality Date  . 24 HOUR PH STUDY N/A 10/29/2015   Procedure: 24 HOUR PH STUDY;  Surgeon: Iva Boop, MD;  Location: [facility];  Service: Endoscopy;  Laterality: N/A;  . COLONOSCOPY    . ESOPHAGEAL MANOMETRY N/A 10/29/2015   Procedure: ESOPHAGEAL MANOMETRY (EM);  Surgeon: Iva Boop, MD;  Location: [facility];  Service: Endoscopy;  Laterality: N/A;  . KNEE SURGERY  2002/2003   bil  . RECTAL SURGERY  correction of prolapse   2010   Past Medical History:  Diagnosis Date  . Allergy   . Arthritis    HANDS,HIPS,KNEES  . Bronchitis, chronic/intermittent 01/22/2012  . Depression 01/22/2012  . GERD (gastroesophageal reflux disease)   . Hyperlipidemia   . IBS (irritable bowel syndrome) 01/22/2012  . Lupus   . Neuromuscular disorder (HCC)   . Osteoporosis   . Seizures (HCC)   . SOB (shortness of breath)   . Thyroid disease    BP 119/83   Pulse 89   SpO2 96%   Opioid Risk Score:  1 Fall Risk Score:  `1  Depression screen [facility] 2/9  Depression screen [facility] 2/9 05/14/2017 02/06/2017 10/23/2016  07/10/2016 04/17/2016 04/08/2016 01/10/2016  Decreased Interest 0 0 1 0 0 1 1  Down, Depressed, Hopeless 0 0 0 0 0 0 0  PHQ - 2 Score 0 0 1 0 0 1 1  Altered sleeping - - - - - - -  Tired, decreased energy - - - - - - -  Change in appetite - - - - - - -  Feeling bad or failure about yourself  - - - - - - -  Trouble concentrating - - - - - - -  Moving slowly or fidgety/restless - - - - - - -  Suicidal thoughts - - - - - - -  PHQ-9 Score - - - - - - -     Review of Systems  HENT: Negative.   Eyes: Negative.   Respiratory: Negative.   Cardiovascular: Negative.   Gastrointestinal: Negative.   Endocrine:  Negative.   Genitourinary: Negative.   Musculoskeletal: Positive for gait problem.       Spasms  Skin: Negative.   Allergic/Immunologic: Negative.   Neurological: Positive for dizziness, tremors, weakness and numbness.       Tingling  Hematological: Negative.   Psychiatric/Behavioral: Negative.   All other systems reviewed and are negative.      Objective:   Physical Exam  Constitutional: She is oriented to person, place, and time. She appears well-developed and well-nourished.  HENT:  Head: Normocephalic and atraumatic.  Neck: Normal range of motion. Neck supple.  Cardiovascular: Normal rate and regular rhythm.   Pulmonary/Chest: Effort normal and breath sounds normal.  Musculoskeletal:  Normal Muscle Bulk and Muscle Testing Reveals: Upper Extremities: Full ROM and Muscle Strength 5/5 Thoracic Paraspinal Tenderness: T-3-T-4 T-7-T-9 Lower Extremities: Full ROM and Muscle Strength 5/5 Arises from Table with ease Narrow Based Gait   Neurological: She is alert and oriented to person, place, and time.  Skin: Skin is warm and dry.  Psychiatric: She has a normal mood and affect.  Nursing note and vitals reviewed.         Assessment & Plan:  1. Chronic seizure disorder: No seizure's. Continue Gabapentin. Neurology Following. 07/14/2017 2. Chronic muscle spasms, weakness with associated pain disorder: Continue Baclofen and MOBIC. Continue with Exercise regime. 07/14/2017 3. Chronic dysphagia: GI Following. 07/14/2017 4. Anxiety with depression : Stable. Continue Elavil. 07/14/2017 5. Fibromyalgia/ Rib Pain/Chronic Pain: Continue with exercise and heat Therapy. 07/14/2017 Refilled: oxyCODONE 7.5/325mg  onetablet 5 times daily as needed #150. We will continue opioid monitoring program, this consists of regular clinic visits, examinations, urine drug screen, pill counts as well as use of [place] Controlled Substance Reporting System. 6. Peripheral Neuropathy: Continue  Gabapentin. 07/14/2017  20 minutes of face to face patient care time was spent during this visit. All questions were encouraged and answered.  F/U in 1 month

## 2017-07-15 ENCOUNTER — Telehealth: Payer: Self-pay | Admitting: Registered Nurse

## 2017-07-15 NOTE — Telephone Encounter (Signed)
On 07/15/2017 the NCCSR was reviewed no conflict was seen [place] the [facility] [facility] [place] with multiple prescribers. Ms. Ulice BrilliantDrake  has a signed narcotic contract with our office. If there were any discrepancies this would have been reported to her physician.

## 2017-07-22 ENCOUNTER — Other Ambulatory Visit: Payer: Self-pay | Admitting: Nurse Practitioner

## 2017-07-22 DIAGNOSIS — R609 Edema, unspecified: Secondary | ICD-10-CM

## 2017-07-22 DIAGNOSIS — K581 Irritable bowel syndrome with constipation: Secondary | ICD-10-CM

## 2017-08-06 ENCOUNTER — Encounter: Payer: BLUE CROSS/BLUE SHIELD | Attending: Physical Medicine & Rehabilitation | Admitting: Registered Nurse

## 2017-08-06 ENCOUNTER — Encounter: Payer: Self-pay | Admitting: Registered Nurse

## 2017-08-06 VITALS — BP 123/85 | HR 90

## 2017-08-06 DIAGNOSIS — M546 Pain in thoracic spine: Secondary | ICD-10-CM | POA: Diagnosis not present

## 2017-08-06 DIAGNOSIS — M797 Fibromyalgia: Secondary | ICD-10-CM | POA: Insufficient documentation

## 2017-08-06 DIAGNOSIS — D8989 Other specified disorders involving the immune mechanism, not elsewhere classified: Secondary | ICD-10-CM | POA: Diagnosis not present

## 2017-08-06 DIAGNOSIS — M545 Low back pain: Secondary | ICD-10-CM | POA: Diagnosis not present

## 2017-08-06 DIAGNOSIS — M6249 Contracture of muscle, multiple sites: Secondary | ICD-10-CM | POA: Insufficient documentation

## 2017-08-06 DIAGNOSIS — M329 Systemic lupus erythematosus, unspecified: Secondary | ICD-10-CM | POA: Diagnosis not present

## 2017-08-06 DIAGNOSIS — M62838 Other muscle spasm: Secondary | ICD-10-CM | POA: Diagnosis not present

## 2017-08-06 DIAGNOSIS — Z5181 Encounter for therapeutic drug level monitoring: Secondary | ICD-10-CM | POA: Diagnosis not present

## 2017-08-06 DIAGNOSIS — G894 Chronic pain syndrome: Secondary | ICD-10-CM | POA: Diagnosis not present

## 2017-08-06 DIAGNOSIS — Z79899 Other long term (current) drug therapy: Secondary | ICD-10-CM

## 2017-08-06 DIAGNOSIS — G8929 Other chronic pain: Secondary | ICD-10-CM | POA: Diagnosis not present

## 2017-08-06 DIAGNOSIS — R569 Unspecified convulsions: Secondary | ICD-10-CM | POA: Diagnosis not present

## 2017-08-06 DIAGNOSIS — G609 Hereditary and idiopathic neuropathy, unspecified: Secondary | ICD-10-CM | POA: Diagnosis not present

## 2017-08-06 MED ORDER — OXYCODONE-ACETAMINOPHEN 7.5-325 MG PO TABS
1.0000 | ORAL_TABLET | Freq: Every day | ORAL | 0 refills | Status: DC | PRN
Start: 2017-08-06 — End: 2017-09-11

## 2017-08-06 NOTE — Progress Notes (Signed)
Subjective:    Patient ID: Autumn Davis, female    DOB: Sep 11, 1986, 31 y.o.   MRN: [id number]  HPI: Autumn Davis is a 31year old female who returns for follow up appointment for chronic pain and medication refill. She states her pain is located in her bilateral ribs, lower extremities and bilateral feet with tingling and burning. . She rates her pain 5 . Her current exercise regime is performing stretching exercises and walking.  Autumn Davis states for the last few months she has experience hypothermia during the night, she states she never told any of her providers. Temperature checked today 98.9. She was instructed to keep a journal and to follow up with her PCP. She verbalizes understanding.   Last UDS was performed on 04/16/2017, it was consistent.   Pain Inventory Average Pain 7 Pain Right Now 5 My pain is constant, sharp, burning, dull, stabbing, tingling and aching  In the last 24 hours, has pain interfered with the following? General activity 9 Relation with others 7 Enjoyment of life 6 What TIME of day is your pain at its worst? evening Sleep (in general) Poor  Pain is worse with: walking, bending, sitting, inactivity, standing and some activites Pain improves with: rest, heat/ice, therapy/exercise, pacing activities, medication and TENS Relief from Meds: 6  Mobility walk without assistance walk with assistance use a cane use a walker ability to climb steps?  yes do you drive?  yes use a wheelchair transfers alone  Function I need assistance with the following:  meal prep, household duties and shopping  Neuro/Psych bladder control problems bowel control problems weakness numbness tremor tingling trouble walking spasms dizziness  Prior Studies Any changes since last visit?  no  Physicians involved in your care Any changes since last visit?  no   Family History  Problem Relation Age of Onset  . Thyroid disease Mother   . Colon polyps  Father   . Lung cancer Maternal Grandmother   . Cancer Paternal Grandmother        colon/pancreatiec/lymphoma  . Irritable bowel syndrome Brother    Social History   Social History  . Marital status: Married    Spouse name: N/A  . Number of children: N/A  . Years of education: N/A   Occupational History  . disabled [employer]   Social History Main Topics  . Smoking status: Never Smoker  . Smokeless tobacco: Never Used  . Alcohol use No  . Drug use: No  . Sexual activity: Not Asked   Other Topics Concern  . None   Social History Narrative  . None   Past Surgical History:  Procedure Laterality Date  . 24 HOUR PH STUDY N/A 10/29/2015   Procedure: 24 HOUR PH STUDY;  Surgeon: Iva Boop, MD;  Location: [facility];  Service: Endoscopy;  Laterality: N/A;  . COLONOSCOPY    . ESOPHAGEAL MANOMETRY N/A 10/29/2015   Procedure: ESOPHAGEAL MANOMETRY (EM);  Surgeon: Iva Boop, MD;  Location: [facility];  Service: Endoscopy;  Laterality: N/A;  . KNEE SURGERY  2002/2003   bil  . RECTAL SURGERY  correction of prolapse   2010   Past Medical History:  Diagnosis Date  . Allergy   . Arthritis    HANDS,HIPS,KNEES  . Bronchitis, chronic/intermittent 01/22/2012  . Depression 01/22/2012  . GERD (gastroesophageal reflux disease)   . Hyperlipidemia   . IBS (irritable bowel syndrome) 01/22/2012  . Lupus   . Neuromuscular disorder (HCC)   .  Osteoporosis   . Seizures (HCC)   . SOB (shortness of breath)   . Thyroid disease    BP 123/85 (BP Location: Left Arm, Patient Position: Sitting, Cuff Size: Normal)   Pulse 90   SpO2 98%   Opioid Risk Score:   Fall Risk Score:  `1  Depression screen [facility] 2/9  Depression screen [facility] 2/9 05/14/2017 02/06/2017 10/23/2016 07/10/2016 04/17/2016 04/08/2016 01/10/2016  Decreased Interest 0 0 1 0 0 1 1  Down, Depressed, Hopeless 0 0 0 0 0 0 0  PHQ - 2 Score 0 0 1 0 0 1 1  Altered sleeping - - - - - - -  Tired, decreased energy - - -  - - - -  Change in appetite - - - - - - -  Feeling bad or failure about yourself  - - - - - - -  Trouble concentrating - - - - - - -  Moving slowly or fidgety/restless - - - - - - -  Suicidal thoughts - - - - - - -  PHQ-9 Score - - - - - - -     Review of Systems  Constitutional: Negative.   HENT: Negative.   Eyes: Negative.   Respiratory: Negative.   Cardiovascular: Negative.   Gastrointestinal: Negative.   Endocrine: Negative.   Genitourinary: Negative.   Musculoskeletal: Negative.   Skin: Negative.   Allergic/Immunologic: Negative.   Neurological: Negative.   Hematological: Negative.   Psychiatric/Behavioral: Negative.   All other systems reviewed and are negative.      Objective:   Physical Exam  Constitutional: She is oriented to person, place, and time. She appears well-developed and well-nourished.  HENT:  Head: Normocephalic and atraumatic.  Neck: Normal range of motion. Neck supple.  Cardiovascular: Normal rate and regular rhythm.   Pulmonary/Chest: Effort normal and breath sounds normal.  Musculoskeletal:  Normal Muscle Bulk and Muscle Testing Reveals: Upper Extremities: Full ROM and Muscle Strength 5/5 Thoracic Paraspinal Tenderness: T-7-T-9 Lower Extremities: Full ROM and Muscle Strength 5/5 Arises from Table with ease Narrow Based Gait  Neurological: She is alert and oriented to person, place, and time.  Skin: Skin is warm and dry.  Psychiatric: She has a normal mood and affect.  Nursing note and vitals reviewed.         Assessment & Plan:  1. Chronic seizure disorder: No seizure's. Continue Gabapentin. Neurology Following. 08/06/2017 2. Chronic muscle spasms, weakness with associated pain disorder: Continue Baclofen and MOBIC. Continue with Exercise regime. 08/06/2017 3. Chronic dysphagia: GI Following. 08/06/2017 4. Anxiety with depression : Stable. Continue Elavil. 08/06/2017 5. Fibromyalgia/ Rib Pain/Chronic Pain: Continue with exercise and  heat Therapy. 08/06/2017 Refilled: oxyCODONE 7.5/325mg  onetablet 5 times daily as needed #150. We will continue opioid monitoring program, this consists of regular clinic visits, examinations, urine drug screen, pill counts as well as use of [place] Controlled Substance Reporting System. 6. Peripheral Neuropathy: Continue Gabapentin. 08/06/2017  20 minutes of face to face patient care time was spent during this visit. All questions were encouraged and answered.  F/U in 1 month

## 2017-08-11 ENCOUNTER — Ambulatory Visit (INDEPENDENT_AMBULATORY_CARE_PROVIDER_SITE_OTHER): Payer: BLUE CROSS/BLUE SHIELD | Admitting: Nurse Practitioner

## 2017-08-11 ENCOUNTER — Encounter: Payer: Self-pay | Admitting: Nurse Practitioner

## 2017-08-11 VITALS — BP 128/82 | HR 89 | Temp 98.2°F | Ht 68.0 in | Wt 223.0 lb

## 2017-08-11 DIAGNOSIS — R1310 Dysphagia, unspecified: Secondary | ICD-10-CM

## 2017-08-11 DIAGNOSIS — M545 Low back pain, unspecified: Secondary | ICD-10-CM

## 2017-08-11 DIAGNOSIS — D8989 Other specified disorders involving the immune mechanism, not elsewhere classified: Secondary | ICD-10-CM | POA: Diagnosis not present

## 2017-08-11 DIAGNOSIS — I479 Paroxysmal tachycardia, unspecified: Secondary | ICD-10-CM | POA: Diagnosis not present

## 2017-08-11 DIAGNOSIS — R569 Unspecified convulsions: Secondary | ICD-10-CM

## 2017-08-11 DIAGNOSIS — M3219 Other organ or system involvement in systemic lupus erythematosus: Secondary | ICD-10-CM

## 2017-08-11 DIAGNOSIS — K581 Irritable bowel syndrome with constipation: Secondary | ICD-10-CM | POA: Diagnosis not present

## 2017-08-11 DIAGNOSIS — E079 Disorder of thyroid, unspecified: Secondary | ICD-10-CM

## 2017-08-11 DIAGNOSIS — F3342 Major depressive disorder, recurrent, in full remission: Secondary | ICD-10-CM

## 2017-08-11 DIAGNOSIS — K219 Gastro-esophageal reflux disease without esophagitis: Secondary | ICD-10-CM | POA: Diagnosis not present

## 2017-08-11 DIAGNOSIS — M797 Fibromyalgia: Secondary | ICD-10-CM

## 2017-08-11 DIAGNOSIS — Z3042 Encounter for surveillance of injectable contraceptive: Secondary | ICD-10-CM | POA: Diagnosis not present

## 2017-08-11 DIAGNOSIS — E782 Mixed hyperlipidemia: Secondary | ICD-10-CM | POA: Diagnosis not present

## 2017-08-11 DIAGNOSIS — M546 Pain in thoracic spine: Secondary | ICD-10-CM | POA: Diagnosis not present

## 2017-08-11 DIAGNOSIS — G8929 Other chronic pain: Secondary | ICD-10-CM

## 2017-08-11 MED ORDER — METOPROLOL SUCCINATE ER 25 MG PO TB24: ORAL_TABLET | ORAL | 1 refills | Status: DC

## 2017-08-11 MED ORDER — PREDNISONE 5 MG PO TABS: 5.0000 mg | ORAL_TABLET | Freq: Every morning | ORAL | 1 refills | Status: DC

## 2017-08-11 MED ORDER — GABAPENTIN 600 MG PO TABS: ORAL_TABLET | ORAL | 1 refills | Status: DC

## 2017-08-11 MED ORDER — MEDROXYPROGESTERONE ACETATE 150 MG/ML IM SUSP
150.0000 mg | Freq: Once | INTRAMUSCULAR | Status: AC
  Administered 2017-08-11: 150 mg via INTRAMUSCULAR

## 2017-08-11 MED ORDER — BACLOFEN 20 MG PO TABS: ORAL_TABLET | ORAL | 1 refills | Status: DC

## 2017-08-11 MED ORDER — DEXLANSOPRAZOLE 60 MG PO CPDR: DELAYED_RELEASE_CAPSULE | ORAL | 1 refills | Status: DC

## 2017-08-11 NOTE — Addendum Note (Signed)
Addended by: Cleda Daub on: 08/11/2017 04:59 PM   Modules accepted: Orders

## 2017-08-11 NOTE — Patient Instructions (Signed)
Hypothermia Hypothermia is a drop in body temperature to 40F (35C) or lower. The average body temperature is between 50F (36.1C) and 53F (37.2C). Hypothermia is life-threatening because the body's organs cannot work properly when the body's temperature is too low. What are the causes? This condition may be caused by:  Being in cold weather or a cool room without being dressed warmly enough.  Getting wet, such as from rain or from falling into cool water.  Medical conditions that affect body temperature, such as hypothyroidism, diabetes, or Parkinson disease.  Medicines that affect the body's ability to control temperature.  What increases the risk? This condition is more likely to develop in:  People who are outside for long periods of time, such as hikers, hunters, or homeless people.  People who use drugs.  People who have difficulty moving around (impaired mobility).  Babies.  Elderly adults.  People who have a medical condition that affects body temperature.  People who take medicine that affects the body's ability to control temperature.  People who drink too much alcohol or who drink it too often.  What are the signs or symptoms? At first, symptoms of hypothermia may include:  Shivering. Shivering may happen early in hypothermia and may stop as the condition gets worse. Hypothermia does not always cause shivering.  Slow thinking, speech, and response time.  Sleepiness.  Confusion.  Pale skin.  Puffy or swollen face.  Numbness.  In infants, skin that is red and cold.  As hypothermia gets worse, the following symptoms may develop:  Uncoordinated movements.  Slow, shallow breathing.  Slow, weak pulse.  Loss of consciousness.  How is this diagnosed? This condition is diagnosed by taking your temperature. You may also have tests, such as:  Blood tests.  Glucose test.  Urine tests.  Liver tests.  Heart tests, such as an electrocardiogram  (ECG).  How is this treated? It is important to seek treatment as soon as possible. Hypothermia is an emergency that needs to be treated in a hospital. Treatment for hypothermia may include:  Receiving IV fluids.  Receiving warm, humidified oxygen through nasal tubing (nasal cannula), an oxygen mask, or a breathing tube.  Rewarming the blood by removing it, passing it through a hemodialysis machine, and returning it to the body at gradually increasing temperatures.  Receiving a warm solution through a small tube that goes into the stomach, bladder, or intestine (cavity lavage).  How is this prevented?  If you have an increased risk of developing hypothermia, keep your home at 43F (20C) or warmer.  Stay alert for dangerous weather situations and plan accordingly. Keep warm items in your house and car and observe safety precautions from government or weather officials.  Get out of cold water right away, and replace wet clothing with dry clothing as soon as possible.  Do not ignore signs that you are cold, such as shivering, cold feet and hands, and numbness.  If you know you will be in a cold environment: ? Wear layers of clothing. ? Do not drink alcohol. Alcohol causes your blood vessels to get larger (dilate) which makes you lose body heat. ? Cover up with blankets. ? Wear a waterproof jacket if you are outside in the rain or snow. Get help right away if:  You develop symptoms of hypothermia.  Hypothermia symptoms get worse or do not get better. This information is not intended to replace advice given to you by your health care provider. Make sure you discuss any questions  you have with your health care provider. Document Released: 04/23/2010 Document Revised: 08/11/2016 Document Reviewed: 08/11/2016 Elsevier Interactive Patient Education  [employer].

## 2017-08-11 NOTE — Progress Notes (Signed)
Subjective:    Patient ID: Autumn Davis, female    DOB: 1986/10/09, 31 y.o.   MRN: [id number]  HPI   Autumn Davis is here today for follow up of chronic medical problem.  Outpatient Encounter Prescriptions as of 08/11/2017  Medication Sig  . acetaZOLAMIDE (DIAMOX) 250 MG tablet TAKE 1 TABLET EVERY MORNING, TAKE 1 TABLET AT 2PM, AND TAKE 2 TABLETSAT BEDTIME  . albuterol (PROVENTIL HFA;VENTOLIN HFA) 108 (90 BASE) MCG/ACT inhaler Inhale 2 puffs into the lungs every 6 (six) hours as needed for wheezing or shortness of breath.  Marland Kitchen amitriptyline (ELAVIL) 75 MG tablet TAKE ONE TABLET DAILY AT BEDTIME  . atorvastatin (LIPITOR) 40 MG tablet TAKE ONE (1) TABLET EACH DAY  . baclofen (LIORESAL) 20 MG tablet TAKE ONE TABLET FOUR TIMES DAILY  . Beclomethasone Dipropionate (QNASL CHILDRENS) 40 MCG/ACT AERS Place into the nose.  . Calcium-Magnesium-Vitamin D (CALCIUM 1200+D3 PO) Take 1 tablet by mouth daily.  . cetirizine (ZYRTEC) 10 MG tablet Take 10 mg by mouth daily.  . Cholecalciferol (VITAMIN D) 2000 UNITS tablet Take 2,000 Units by mouth daily.  . cycloSPORINE (RESTASIS) 0.05 % ophthalmic emulsion Place 1 drop into both eyes 2 (two) times daily.  Marland Kitchen DEXILANT 60 MG capsule TAKE ONE (1) CAPSULE EACH DAY  . docusate sodium (COLACE) 100 MG capsule Take 100 mg by mouth 2 (two) times daily. PRN  . EPINEPHRINE 0.3 mg/0.3 mL IJ SOAJ injection USE AS DIRECTED  . FORTEO 600 MCG/2.4ML SOLN Inject 20 mcg into the skin at bedtime.  . furosemide (LASIX) 20 MG tablet TAKE ONE (1) TABLET EACH DAY  . gabapentin (NEURONTIN) 600 MG tablet TAKE ONE TABLET FOUR TIMES DAILY  . ipratropium-albuterol (DUONEB) 0.5-2.5 (3) MG/3ML SOLN Take 3 mLs by nebulization every 4 (four) hours as needed.  Marland Kitchen L-Methylfolate-Algae (L-METHYLFOLATE FORTE) 15-90.314 MG CAPS TAKE ONE (1) CAPSULE EACH DAY  . medroxyPROGESTERone (DEPO-PROVERA) 150 MG/ML injection Inject 150 mg into the muscle as directed. Every 2 months  . meloxicam (MOBIC) 15 MG  tablet TAKE ONE (1) TABLET EACH DAY  . metoprolol succinate (TOPROL-XL) 25 MG 24 hr tablet TAKE ONE (1) TABLET EACH DAY  . Miconazole 50 MG TABS Place 1 tablet (50 mg total) inside cheek daily.  . montelukast (SINGULAIR) 10 MG tablet TAKE ONE TABLET DAILY AT BEDTIME  . nystatin (MYCOSTATIN) 100000 UNIT/ML suspension   . nystatin-triamcinolone ointment (MYCOLOG) Apply 1 application topically 2 (two) times daily.  . Omega 3 1000 MG CAPS Take 1 capsule by mouth daily.  Marland Kitchen oxyCODONE-acetaminophen (PERCOCET) 7.5-325 MG tablet Take 1 tablet by mouth 5 (five) times daily as needed for severe pain. No More Than 5 a day  . predniSONE (DELTASONE) 5 MG tablet TAKE ONE TABLET EVERY MORNING  . promethazine (PHENERGAN) 12.5 MG tablet Take 1 tablet (12.5 mg total) by mouth every 8 (eight) hours as needed for nausea or vomiting.  . tamsulosin (FLOMAX) 0.4 MG CAPS capsule TAKE ONE (1) CAPSULE EACH DAY  . vitamin E (VITAMIN E) 400 UNIT capsule Take 400 Units by mouth daily.   No facility-administered encounter medications on file as of 08/11/2017.     1. Tachycardia, paroxysmal ([place])  patient says she has occassional palpitations but of she seats down a d rest it will usually resolve  2. Irritable bowel syndrome with constipation  Stays constipated- uses miralax daily- takes baclofen and amitriptyline daily- sees GI every 6 months  3. Gastroesophageal reflux disease without esophagitis  dexilant keeps symptoms under ocntrol  Subjective:    Patient ID: Autumn Davis, female    DOB: 1986/10/09, 31 y.o.   MRN: [id number]  HPI   Autumn Davis is here today for follow up of chronic medical problem.  Outpatient Encounter Prescriptions as of 08/11/2017  Medication Sig  . acetaZOLAMIDE (DIAMOX) 250 MG tablet TAKE 1 TABLET EVERY MORNING, TAKE 1 TABLET AT 2PM, AND TAKE 2 TABLETSAT BEDTIME  . albuterol (PROVENTIL HFA;VENTOLIN HFA) 108 (90 BASE) MCG/ACT inhaler Inhale 2 puffs into the lungs every 6 (six) hours as needed for wheezing or shortness of breath.  Marland Kitchen amitriptyline (ELAVIL) 75 MG tablet TAKE ONE TABLET DAILY AT BEDTIME  . atorvastatin (LIPITOR) 40 MG tablet TAKE ONE (1) TABLET EACH DAY  . baclofen (LIORESAL) 20 MG tablet TAKE ONE TABLET FOUR TIMES DAILY  . Beclomethasone Dipropionate (QNASL CHILDRENS) 40 MCG/ACT AERS Place into the nose.  . Calcium-Magnesium-Vitamin D (CALCIUM 1200+D3 PO) Take 1 tablet by mouth daily.  . cetirizine (ZYRTEC) 10 MG tablet Take 10 mg by mouth daily.  . Cholecalciferol (VITAMIN D) 2000 UNITS tablet Take 2,000 Units by mouth daily.  . cycloSPORINE (RESTASIS) 0.05 % ophthalmic emulsion Place 1 drop into both eyes 2 (two) times daily.  Marland Kitchen DEXILANT 60 MG capsule TAKE ONE (1) CAPSULE EACH DAY  . docusate sodium (COLACE) 100 MG capsule Take 100 mg by mouth 2 (two) times daily. PRN  . EPINEPHRINE 0.3 mg/0.3 mL IJ SOAJ injection USE AS DIRECTED  . FORTEO 600 MCG/2.4ML SOLN Inject 20 mcg into the skin at bedtime.  . furosemide (LASIX) 20 MG tablet TAKE ONE (1) TABLET EACH DAY  . gabapentin (NEURONTIN) 600 MG tablet TAKE ONE TABLET FOUR TIMES DAILY  . ipratropium-albuterol (DUONEB) 0.5-2.5 (3) MG/3ML SOLN Take 3 mLs by nebulization every 4 (four) hours as needed.  Marland Kitchen L-Methylfolate-Algae (L-METHYLFOLATE FORTE) 15-90.314 MG CAPS TAKE ONE (1) CAPSULE EACH DAY  . medroxyPROGESTERone (DEPO-PROVERA) 150 MG/ML injection Inject 150 mg into the muscle as directed. Every 2 months  . meloxicam (MOBIC) 15 MG  tablet TAKE ONE (1) TABLET EACH DAY  . metoprolol succinate (TOPROL-XL) 25 MG 24 hr tablet TAKE ONE (1) TABLET EACH DAY  . Miconazole 50 MG TABS Place 1 tablet (50 mg total) inside cheek daily.  . montelukast (SINGULAIR) 10 MG tablet TAKE ONE TABLET DAILY AT BEDTIME  . nystatin (MYCOSTATIN) 100000 UNIT/ML suspension   . nystatin-triamcinolone ointment (MYCOLOG) Apply 1 application topically 2 (two) times daily.  . Omega 3 1000 MG CAPS Take 1 capsule by mouth daily.  Marland Kitchen oxyCODONE-acetaminophen (PERCOCET) 7.5-325 MG tablet Take 1 tablet by mouth 5 (five) times daily as needed for severe pain. No More Than 5 a day  . predniSONE (DELTASONE) 5 MG tablet TAKE ONE TABLET EVERY MORNING  . promethazine (PHENERGAN) 12.5 MG tablet Take 1 tablet (12.5 mg total) by mouth every 8 (eight) hours as needed for nausea or vomiting.  . tamsulosin (FLOMAX) 0.4 MG CAPS capsule TAKE ONE (1) CAPSULE EACH DAY  . vitamin E (VITAMIN E) 400 UNIT capsule Take 400 Units by mouth daily.   No facility-administered encounter medications on file as of 08/11/2017.     1. Tachycardia, paroxysmal ([place])  patient says she has occassional palpitations but of she seats down a d rest it will usually resolve  2. Irritable bowel syndrome with constipation  Stays constipated- uses miralax daily- takes baclofen and amitriptyline daily- sees GI every 6 months  3. Gastroesophageal reflux disease without esophagitis  dexilant keeps symptoms under ocntrol   Subjective:    Patient ID: Autumn Davis, female    DOB: 03/14/1986, 31 y.o.   MRN: [id number]  HPI   Autumn Davis is here today for follow up of chronic medical problem.  Outpatient Encounter Prescriptions as of 08/11/2017  Medication Sig  . acetaZOLAMIDE (DIAMOX) 250 MG tablet TAKE 1 TABLET EVERY MORNING, TAKE 1 TABLET AT 2PM, AND TAKE 2 TABLETSAT BEDTIME  . albuterol (PROVENTIL HFA;VENTOLIN HFA) 108 (90 BASE) MCG/ACT inhaler Inhale 2 puffs into the lungs every 6 (six) hours as needed for wheezing or shortness of breath.  . amitriptyline (ELAVIL) 75 MG tablet TAKE ONE TABLET DAILY AT BEDTIME  . atorvastatin (LIPITOR) 40 MG tablet TAKE ONE (1) TABLET EACH DAY  . baclofen (LIORESAL) 20 MG tablet TAKE ONE TABLET FOUR TIMES DAILY  . Beclomethasone Dipropionate (QNASL CHILDRENS) 40 MCG/ACT AERS Place into the nose.  . Calcium-Magnesium-Vitamin D (CALCIUM 1200+D3 PO) Take 1 tablet by mouth daily.  . cetirizine (ZYRTEC) 10 MG tablet Take 10 mg by mouth daily.  . Cholecalciferol (VITAMIN D) 2000 UNITS tablet Take 2,000 Units by mouth daily.  . cycloSPORINE (RESTASIS) 0.05 % ophthalmic emulsion Place 1 drop into both eyes 2 (two) times daily.  . DEXILANT 60 MG capsule TAKE ONE (1) CAPSULE EACH DAY  . docusate sodium (COLACE) 100 MG capsule Take 100 mg by mouth 2 (two) times daily. PRN  . EPINEPHRINE 0.3 mg/0.3 mL IJ SOAJ injection USE AS DIRECTED  . FORTEO 600 MCG/2.4ML SOLN Inject 20 mcg into the skin at bedtime.  . furosemide (LASIX) 20 MG tablet TAKE ONE (1) TABLET EACH DAY  . gabapentin (NEURONTIN) 600 MG tablet TAKE ONE TABLET FOUR TIMES DAILY  . ipratropium-albuterol (DUONEB) 0.5-2.5 (3) MG/3ML SOLN Take 3 mLs by nebulization every 4 (four) hours as needed.  . L-Methylfolate-Algae (L-METHYLFOLATE FORTE) 15-90.314 MG CAPS TAKE ONE (1) CAPSULE EACH DAY  . medroxyPROGESTERone (DEPO-PROVERA) 150 MG/ML injection Inject 150 mg into the muscle as directed. Every 2 months  . meloxicam (MOBIC) 15 MG  tablet TAKE ONE (1) TABLET EACH DAY  . metoprolol succinate (TOPROL-XL) 25 MG 24 hr tablet TAKE ONE (1) TABLET EACH DAY  . Miconazole 50 MG TABS Place 1 tablet (50 mg total) inside cheek daily.  . montelukast (SINGULAIR) 10 MG tablet TAKE ONE TABLET DAILY AT BEDTIME  . nystatin (MYCOSTATIN) 100000 UNIT/ML suspension   . nystatin-triamcinolone ointment (MYCOLOG) Apply 1 application topically 2 (two) times daily.  . Omega 3 1000 MG CAPS Take 1 capsule by mouth daily.  . oxyCODONE-acetaminophen (PERCOCET) 7.5-325 MG tablet Take 1 tablet by mouth 5 (five) times daily as needed for severe pain. No More Than 5 a day  . predniSONE (DELTASONE) 5 MG tablet TAKE ONE TABLET EVERY MORNING  . promethazine (PHENERGAN) 12.5 MG tablet Take 1 tablet (12.5 mg total) by mouth every 8 (eight) hours as needed for nausea or vomiting.  . tamsulosin (FLOMAX) 0.4 MG CAPS capsule TAKE ONE (1) CAPSULE EACH DAY  . vitamin E (VITAMIN E) 400 UNIT capsule Take 400 Units by mouth daily.   No facility-administered encounter medications on file as of 08/11/2017.     1. Tachycardia, paroxysmal ([facility])  patient says she has occassional palpitations but of she seats down a d rest it will usually resolve  2. Irritable bowel syndrome with constipation  Stays constipated- uses miralax daily- takes baclofen and amitriptyline daily- sees GI every 6 months  3. Gastroesophageal reflux disease without esophagitis  dexilant keeps symptoms under ocntrol  

## 2017-08-12 LAB — LIPID PANEL
CHOL/HDL RATIO: 3.5 ratio (ref 0.0–4.4)
Cholesterol, Total: 200 mg/dL — ABNORMAL HIGH (ref 100–199)
HDL: 57 mg/dL (ref >39)
LDL CALC: 111 mg/dL — AB (ref 0–99)
Triglycerides: 160 mg/dL — ABNORMAL HIGH (ref 0–149)
VLDL Cholesterol Cal: 32 mg/dL (ref 5–40)

## 2017-08-12 LAB — CMP14+EGFR
ALBUMIN: 4.7 g/dL (ref 3.5–5.5)
ALK PHOS: 115 IU/L (ref 39–117)
ALT: 15 IU/L (ref 0–32)
AST: 18 IU/L (ref 0–40)
Albumin/Globulin Ratio: 1.9 (ref 1.2–2.2)
BUN/Creatinine Ratio: 10 (ref 9–23)
BUN: 9 mg/dL (ref 6–20)
Bilirubin Total: 0.4 mg/dL (ref 0.0–1.2)
CO2: 22 mmol/L (ref 20–29)
CREATININE: 0.88 mg/dL (ref 0.57–1.00)
Calcium: 9.6 mg/dL (ref 8.7–10.2)
Chloride: 103 mmol/L (ref 96–106)
GFR calc Af Amer: 100 mL/min/{1.73_m2} (ref >59)
GFR calc non Af Amer: 87 mL/min/{1.73_m2} (ref >59)
GLUCOSE: 106 mg/dL — AB (ref 65–99)
Globulin, Total: 2.5 g/dL (ref 1.5–4.5)
Potassium: 3.3 mmol/L — ABNORMAL LOW (ref 3.5–5.2)
Sodium: 142 mmol/L (ref 134–144)
Total Protein: 7.2 g/dL (ref 6.0–8.5)

## 2017-08-12 LAB — THYROID PANEL WITH TSH
Free Thyroxine Index: 1.7 (ref 1.2–4.9)
T3 Uptake Ratio: 25 % (ref 24–39)
T4, Total: 6.9 ug/dL (ref 4.5–12.0)
TSH: 1.44 u[IU]/mL (ref 0.450–4.500)

## 2017-09-11 ENCOUNTER — Encounter: Payer: BLUE CROSS/BLUE SHIELD | Attending: Physical Medicine & Rehabilitation | Admitting: Registered Nurse

## 2017-09-11 ENCOUNTER — Encounter: Payer: Self-pay | Admitting: Registered Nurse

## 2017-09-11 VITALS — BP 136/86 | HR 92 | Resp 14

## 2017-09-11 DIAGNOSIS — Z79899 Other long term (current) drug therapy: Secondary | ICD-10-CM

## 2017-09-11 DIAGNOSIS — M545 Low back pain: Secondary | ICD-10-CM | POA: Diagnosis not present

## 2017-09-11 DIAGNOSIS — M62838 Other muscle spasm: Secondary | ICD-10-CM | POA: Diagnosis not present

## 2017-09-11 DIAGNOSIS — M546 Pain in thoracic spine: Secondary | ICD-10-CM | POA: Diagnosis not present

## 2017-09-11 DIAGNOSIS — M6249 Contracture of muscle, multiple sites: Secondary | ICD-10-CM | POA: Insufficient documentation

## 2017-09-11 DIAGNOSIS — M329 Systemic lupus erythematosus, unspecified: Secondary | ICD-10-CM | POA: Insufficient documentation

## 2017-09-11 DIAGNOSIS — D8989 Other specified disorders involving the immune mechanism, not elsewhere classified: Secondary | ICD-10-CM

## 2017-09-11 DIAGNOSIS — G8929 Other chronic pain: Secondary | ICD-10-CM | POA: Diagnosis not present

## 2017-09-11 DIAGNOSIS — G894 Chronic pain syndrome: Secondary | ICD-10-CM

## 2017-09-11 DIAGNOSIS — Z5181 Encounter for therapeutic drug level monitoring: Secondary | ICD-10-CM

## 2017-09-11 DIAGNOSIS — M797 Fibromyalgia: Secondary | ICD-10-CM

## 2017-09-11 DIAGNOSIS — G609 Hereditary and idiopathic neuropathy, unspecified: Secondary | ICD-10-CM

## 2017-09-11 DIAGNOSIS — R569 Unspecified convulsions: Secondary | ICD-10-CM

## 2017-09-11 MED ORDER — OXYCODONE-ACETAMINOPHEN 7.5-325 MG PO TABS: 1.0000 | ORAL_TABLET | Freq: Every day | ORAL | 0 refills | Status: DC | PRN

## 2017-09-11 NOTE — Progress Notes (Signed)
Subjective:    Patient ID: Autumn Davis, female    DOB: [birth year], 31 y.o.   MRN: [id number]  HPI: Autumn Davis is a 31year old female who returns for follow up appointment for chronic pain and medication refill. She states her pain is located in her bilateral ribs, lower extremities and bilateral feet with tingling and burning. Also reports increase intensity  Of burning in her lower extremities L>R and bilateral feet. We will increase her evening and bedtime gabapentin to 11/2 tablets (900 mg) to see if she can tolerate. If she is able to tolerate will change her Gabapentin to 800 mg QID, she verbalizes understanding. Her husband is off this week this way to watch for any daytime drowsiness, she verbalizes understanding.  She rates her pain 6 . Her current exercise regime is performing stretching exercises and walking.  Autumn Davis Morphine equivalent is 56.25 MME.    Autumn Davis forgot her Oxycodone according to PMR web-site she picked up her Oxycodone on 08/19/2017.   Last UDS was performed on 04/16/2017, it was consistent.   Pain Inventory Average Pain 6 Pain Right Now 6 My pain is constant, sharp, burning, stabbing, tingling and aching  In the last 24 hours, has pain interfered with the following? General activity 8 Relation with others 7 Enjoyment of life 6 What TIME of day is your pain at its worst? evening, night Sleep (in general) Poor  Pain is worse with: walking, bending, sitting, inactivity, standing and some activites Pain improves with: rest, heat/ice, therapy/exercise, pacing activities, medication and TENS Relief from Meds: 4  Mobility walk without assistance walk with assistance use a cane use a walker ability to climb steps?  yes do you drive?  no use a wheelchair transfers alone Do you have any goals in this area?  yes  Function I need assistance with the following:  meal prep, household duties and shopping  Neuro/Psych bladder control  problems bowel control problems weakness numbness tremor tingling trouble walking spasms dizziness  Prior Studies Any changes since last visit?  no  Physicians involved in your care Any changes since last visit?  no   Family History  Problem Relation Age of Onset  . Thyroid disease Mother   . Colon polyps Father   . Lung cancer Maternal Grandmother   . Cancer Paternal Grandmother        colon/pancreatiec/lymphoma  . Irritable bowel syndrome Brother    Social History   Social History  . Marital status: Married    Spouse name: N/A  . Number of children: N/A  . Years of education: N/A   Occupational History  . disabled [employer]   Social History Main Topics  . Smoking status: Never Smoker  . Smokeless tobacco: Never Used  . Alcohol use No  . Drug use: No  . Sexual activity: Not Asked   Other Topics Concern  . None   Social History Narrative  . None   Past Surgical History:  Procedure Laterality Date  . 24 HOUR PH STUDY N/A 10/29/2015   Procedure: 24 HOUR PH STUDY;  Surgeon: Iva Boop, MD;  Location: [facility];  Service: Endoscopy;  Laterality: N/A;  . COLONOSCOPY    . ESOPHAGEAL MANOMETRY N/A 10/29/2015   Procedure: ESOPHAGEAL MANOMETRY (EM);  Surgeon: Iva Boop, MD;  Location: [facility];  Service: Endoscopy;  Laterality: N/A;  . KNEE SURGERY  2002/2003   bil  . RECTAL SURGERY  correction of prolapse  2010   Past Medical History:  Diagnosis Date  . Allergy   . Arthritis    HANDS,HIPS,KNEES  . Bronchitis, chronic/intermittent 01/22/2012  . Depression 01/22/2012  . GERD (gastroesophageal reflux disease)   . Hyperlipidemia   . IBS (irritable bowel syndrome) 01/22/2012  . Lupus   . Neuromuscular disorder ([facility])   . Osteoporosis   . Seizures ([facility])   . SOB (shortness of breath)   . Thyroid disease    BP 136/86 (BP Location: Right Arm, Patient Position: Sitting, Cuff Size: Normal)   Pulse 92   Resp 14   SpO2 96%    Opioid Risk Score:   Fall Risk Score:  `1  Depression screen PHQ 2/9  Depression screen [facility] 2/9 05/14/2017 02/06/2017 10/23/2016 07/10/2016 04/17/2016 04/08/2016 01/10/2016  Decreased Interest 0 0 1 0 0 1 1  Down, Depressed, Hopeless 0 0 0 0 0 0 0  PHQ - 2 Score 0 0 1 0 0 1 1  Altered sleeping - - - - - - -  Tired, decreased energy - - - - - - -  Change in appetite - - - - - - -  Feeling bad or failure about yourself  - - - - - - -  Trouble concentrating - - - - - - -  Moving slowly or fidgety/restless - - - - - - -  Suicidal thoughts - - - - - - -  PHQ-9 Score - - - - - - -     Review of Systems  HENT: Negative.   Eyes: Negative.   Respiratory: Negative.   Cardiovascular: Negative.   Gastrointestinal: Positive for constipation and diarrhea.  Endocrine: Negative.   Genitourinary: Positive for difficulty urinating, frequency and urgency.       Incontinence  Musculoskeletal: Positive for arthralgias, back pain and gait problem.       Spasms   Skin: Negative.   Allergic/Immunologic: Negative.   Neurological: Positive for tremors, weakness and numbness.       Tingling  Hematological: Negative.   Psychiatric/Behavioral: Negative.   All other systems reviewed and are negative.      Objective:   Physical Exam  Constitutional: She is oriented to person, place, and time. She appears well-developed and well-nourished.  HENT:  Head: Normocephalic and atraumatic.  Neck: Normal range of motion. Neck supple.  Cardiovascular: Normal rate and regular rhythm.   Pulmonary/Chest: Effort normal and breath sounds normal.  Musculoskeletal:  Normal Muscle Bulk and Muscle Testing Reveals: Upper Extremities: Full ROM and Muscle Strength 5/5 Thoracic Paraspinal Tenderness: T-7-T-9 Mainly Right Side Lower Extremities: Full ROM and Muscle Strength 5/5 Arises from Table with ease Narrow Based Gait  Neurological: She is alert and oriented to person, place, and time.  Skin: Skin is warm and  dry.  Psychiatric: She has a normal mood and affect.  Nursing note and vitals reviewed.         Assessment & Plan:  1. Chronic seizure disorder: No seizure's. Continue Gabapentin. Neurology Following. 09/11/2017 2. Chronic muscle spasms, weakness with associated pain disorder: Continue Baclofen and MOBIC. Continue with Exercise regime. 09/11/2017 3. Chronic dysphagia: GI Following. 09/11/2017 4. Anxiety with depression : Stable. Continue Elavil. 09/11/2017 5. Fibromyalgia/ Rib Pain/Chronic Pain: Continue with exercise and heat Therapy. 09/11/2017 Refilled: oxyCODONE 7.5/325mg  onetablet 5 times daily as needed #150. We will continue opioid monitoring program, this consists of regular clinic visits, examinations, urine drug screen, pill counts as well as use of [place] Controlled Substance  Reporting System. 6. Peripheral Neuropathy: Increase  Gabapentin, call office next week to evaluate.09/11/2017  20 minutes of face to face patient care time was spent during this visit. All questions were encouraged and answered.  F/U in 1 month

## 2017-09-15 ENCOUNTER — Telehealth: Payer: Self-pay

## 2017-09-15 NOTE — Telephone Encounter (Signed)
Patient called to inform you that the medication changes you made on her last visit are working for her, states that the issue has not completely gone away but does feel a difference

## 2017-09-16 ENCOUNTER — Ambulatory Visit: Payer: BLUE CROSS/BLUE SHIELD | Admitting: Registered Nurse

## 2017-09-21 ENCOUNTER — Other Ambulatory Visit: Payer: Self-pay | Admitting: Nurse Practitioner

## 2017-09-21 DIAGNOSIS — M797 Fibromyalgia: Secondary | ICD-10-CM

## 2017-09-23 ENCOUNTER — Ambulatory Visit (INDEPENDENT_AMBULATORY_CARE_PROVIDER_SITE_OTHER): Payer: BLUE CROSS/BLUE SHIELD

## 2017-09-23 DIAGNOSIS — Z23 Encounter for immunization: Secondary | ICD-10-CM | POA: Diagnosis not present

## 2017-10-13 DIAGNOSIS — Z01419 Encounter for gynecological examination (general) (routine) without abnormal findings: Secondary | ICD-10-CM | POA: Diagnosis not present

## 2017-10-13 DIAGNOSIS — Z6835 Body mass index (BMI) 35.0-35.9, adult: Secondary | ICD-10-CM | POA: Diagnosis not present

## 2017-10-14 ENCOUNTER — Encounter: Payer: Self-pay | Admitting: Physical Medicine & Rehabilitation

## 2017-10-14 ENCOUNTER — Encounter
Payer: BLUE CROSS/BLUE SHIELD | Attending: Physical Medicine & Rehabilitation | Admitting: Physical Medicine & Rehabilitation

## 2017-10-14 DIAGNOSIS — M62838 Other muscle spasm: Secondary | ICD-10-CM

## 2017-10-14 DIAGNOSIS — M797 Fibromyalgia: Secondary | ICD-10-CM

## 2017-10-14 DIAGNOSIS — M6249 Contracture of muscle, multiple sites: Secondary | ICD-10-CM | POA: Insufficient documentation

## 2017-10-14 DIAGNOSIS — R569 Unspecified convulsions: Secondary | ICD-10-CM | POA: Diagnosis not present

## 2017-10-14 DIAGNOSIS — M3219 Other organ or system involvement in systemic lupus erythematosus: Secondary | ICD-10-CM

## 2017-10-14 DIAGNOSIS — M329 Systemic lupus erythematosus, unspecified: Secondary | ICD-10-CM | POA: Insufficient documentation

## 2017-10-14 DIAGNOSIS — M545 Low back pain: Secondary | ICD-10-CM | POA: Insufficient documentation

## 2017-10-14 MED ORDER — GABAPENTIN 600 MG PO TABS: ORAL_TABLET | ORAL | 2 refills | Status: DC

## 2017-10-14 MED ORDER — OXYCODONE-ACETAMINOPHEN 7.5-325 MG PO TABS: 1.0000 | ORAL_TABLET | Freq: Every day | ORAL | 0 refills | Status: DC | PRN

## 2017-10-14 NOTE — Progress Notes (Signed)
Subjective:    Patient ID: Autumn Davis, female    DOB: [birth year], 31 y.o.   MRN: [id number]  HPI   Autumn Davis is here in follow up of her chronic pain and autoimmune disease. There have been no new changes in her rheumatological symptoms. She remains on prednisone for diseae progression. We increased her gabapentin to 3000mg  daily at last month which seems to have helped her generalized pain.   She is walking regularly. She walks up to 8-10k steps daily now!!   She remains on percocet for pain control which has been effective. She is moving her bowels regularly. Mobic seems to provide her some relief also.   Pain Inventory Average Pain 7 Pain Right Now 6 My pain is burning, dull, stabbing, tingling and aching  In the last 24 hours, has pain interfered with the following? General activity 6 Relation with others 6 Enjoyment of life 4 What TIME of day is your pain at its worst? evening Sleep (in general) Poor  Pain is worse with: walking, bending, sitting, inactivity, standing and some activites Pain improves with: rest, heat/ice, therapy/exercise, pacing activities, medication and TENS Relief from Meds: 6  Mobility walk without assistance ability to climb steps?  yes do you drive?  no  Function disabled: date disabled 2012  Neuro/Psych bladder control problems bowel control problems weakness numbness tremor tingling trouble walking spasms dizziness  Prior Studies Any changes since last visit?  no  Physicians involved in your care Any changes since last visit?  no   Family History  Problem Relation Age of Onset  . Thyroid disease Mother   . Colon polyps Father   . Lung cancer Maternal Grandmother   . Cancer Paternal Grandmother        colon/pancreatiec/lymphoma  . Irritable bowel syndrome Brother    Social History   Socioeconomic History  . Marital status: Married    Spouse name: Not on file  . Number of children: Not on file  . Years of education: Not  on file  . Highest education level: Not on file  Social Needs  . Financial resource strain: Not on file  . Food insecurity - worry: Not on file  . Food insecurity - inability: Not on file  . Transportation needs - medical: Not on file  . Transportation needs - non-medical: Not on file  Occupational History  . Occupation: disabled    Employer: [employer]  Tobacco Use  . Smoking status: Never Smoker  . Smokeless tobacco: Never Used  Substance and Sexual Activity  . Alcohol use: No    Alcohol/week: 0.0 oz  . Drug use: No  . Sexual activity: Not on file  Other Topics Concern  . Not on file  Social History Narrative  . Not on file   Past Surgical History:  Procedure Laterality Date  . 24 HOUR PH STUDY N/A 10/29/2015   Procedure: 24 HOUR PH STUDY;  Surgeon: Iva Boop, MD;  Location: [facility];  Service: Endoscopy;  Laterality: N/A;  . COLONOSCOPY    . ESOPHAGEAL MANOMETRY N/A 10/29/2015   Procedure: ESOPHAGEAL MANOMETRY (EM);  Surgeon: Iva Boop, MD;  Location: [facility];  Service: Endoscopy;  Laterality: N/A;  . KNEE SURGERY  2002/2003   bil  . RECTAL SURGERY  correction of prolapse   2010   Past Medical History:  Diagnosis Date  . Allergy   . Arthritis    HANDS,HIPS,KNEES  . Bronchitis, chronic/intermittent 01/22/2012  . Depression 01/22/2012  .  GERD (gastroesophageal reflux disease)   . Hyperlipidemia   . IBS (irritable bowel syndrome) 01/22/2012  . Lupus   . Neuromuscular disorder (H[facility])   . Osteoporosis   . Seizures ([facility])   . SOB (shortness of breath)   . Thyroid disease    There were no vitals taken for this visit.  Opioid Risk Score:   Fall Risk Score:  `1  Depression screen PHQ 2/9  Depression screen [facility] 2/9 05/14/2017 02/06/2017 10/23/2016 07/10/2016 04/17/2016 04/08/2016 01/10/2016  Decreased Interest 0 0 1 0 0 1 1  Down, Depressed, Hopeless 0 0 0 0 0 0 0  PHQ - 2 Score 0 0 1 0 0 1 1  Altered sleeping - - - - - - -  Tired, decreased  energy - - - - - - -  Change in appetite - - - - - - -  Feeling bad or failure about yourself  - - - - - - -  Trouble concentrating - - - - - - -  Moving slowly or fidgety/restless - - - - - - -  Suicidal thoughts - - - - - - -  PHQ-9 Score - - - - - - -     Review of Systems  Constitutional: Negative.   HENT: Negative.   Eyes: Negative.   Respiratory: Negative.   Cardiovascular: Negative.   Gastrointestinal: Negative.   Endocrine: Negative.   Genitourinary: Negative.   Musculoskeletal: Negative.   Skin: Negative.   Allergic/Immunologic: Negative.   Neurological: Negative.   Hematological: Negative.   Psychiatric/Behavioral: Negative.   All other systems reviewed and are negative.      Objective:   Physical Exam  General: Alert and oriented x 3, No apparent distress. Remains obese.  HEENT:PERRl, EOMI Neck:Supple without JVD or lymphadenopathy  Heart: RRR Chest:clear. Normal effort Abdomen:NT, soft.  Extremities:edemtrace  Skin:skin warm and dry.  Neuro:  motor 5/5. Sensory intact.  Some attention deficit evident but quite functional from a cognitive standpoint Musculoskeletal:general trunk pain .  Psych:appears alert. Pleasant and appropriate.   Assessment & Plan:  1. Chronic seizure disorder---? myoclonic jerks/myotonia 2. 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  3. Chronic dysphagia  4. Right low back/flank pain  5. Fibromyalgia syndrome  6. Anxiety with depression  7. Pulmonary function abnormalities., ?CO2 retention? ---recent normal ECHO from 09/15/14 8. Osteoporosis:    Plan:  1. Continue Elavil at 75mg  qhs  2. Maintain baclofen to 20mg  for muscle spasms. Maintain HEP for ROM  3. Continue prednisone per rheumatology 4. Continue meloxicam 15mg  daily. Consider trial of diclofenac 75mg  BID in place of meloxicam. ?after Holidays 5. Oxycodone 7.5mg  fr breakthrough  pain #150. This was refilled today. We will continue the opioid monitoring program, this consists of regular clinic visits, examinations, routine drug screening, pill counts as well as use of [place] Controlled Substance Reporting System. [place] today.   6. Continue Gabapentin 600mg  5x daily for seizures, spasms, neuro pain---she's finding benefit  7. Continue lidoderm patches for trunk/back pain.  8. Forteo per primary   Follow up with NP  in about one month. 15 minutes of face to face patient care time were spent during this visit. All questions were encouraged and answered.

## 2017-10-14 NOTE — Patient Instructions (Addendum)
MAINTAIN YOUR PHYSICAL ACTIVITY AS TOLERATED, ESPECIALLY AEROBIC ACTIVITY.   REMEMBER TO KEEP GOOD POSTURE WHEN YOU SIT AND STAND.  STAND TALL!!!  PLEASE FEEL FREE TO CALL OUR OFFICE WITH ANY PROBLEMS OR QUESTIONS [phone])

## 2017-10-19 ENCOUNTER — Other Ambulatory Visit: Payer: Self-pay | Admitting: Nurse Practitioner

## 2017-10-19 DIAGNOSIS — R609 Edema, unspecified: Secondary | ICD-10-CM

## 2017-11-06 ENCOUNTER — Ambulatory Visit (INDEPENDENT_AMBULATORY_CARE_PROVIDER_SITE_OTHER): Payer: BLUE CROSS/BLUE SHIELD | Admitting: Nurse Practitioner

## 2017-11-06 ENCOUNTER — Encounter: Payer: Self-pay | Admitting: Nurse Practitioner

## 2017-11-06 VITALS — BP 121/79 | HR 91 | Temp 97.1°F | Ht 68.0 in | Wt 233.0 lb

## 2017-11-06 DIAGNOSIS — E782 Mixed hyperlipidemia: Secondary | ICD-10-CM

## 2017-11-06 DIAGNOSIS — R1310 Dysphagia, unspecified: Secondary | ICD-10-CM

## 2017-11-06 DIAGNOSIS — R569 Unspecified convulsions: Secondary | ICD-10-CM

## 2017-11-06 DIAGNOSIS — M329 Systemic lupus erythematosus, unspecified: Secondary | ICD-10-CM | POA: Diagnosis not present

## 2017-11-06 DIAGNOSIS — E079 Disorder of thyroid, unspecified: Secondary | ICD-10-CM | POA: Diagnosis not present

## 2017-11-06 DIAGNOSIS — D8989 Other specified disorders involving the immune mechanism, not elsewhere classified: Secondary | ICD-10-CM

## 2017-11-06 DIAGNOSIS — K581 Irritable bowel syndrome with constipation: Secondary | ICD-10-CM

## 2017-11-06 DIAGNOSIS — R609 Edema, unspecified: Secondary | ICD-10-CM | POA: Diagnosis not present

## 2017-11-06 DIAGNOSIS — F3342 Major depressive disorder, recurrent, in full remission: Secondary | ICD-10-CM

## 2017-11-06 DIAGNOSIS — I479 Paroxysmal tachycardia, unspecified: Secondary | ICD-10-CM | POA: Diagnosis not present

## 2017-11-06 DIAGNOSIS — M797 Fibromyalgia: Secondary | ICD-10-CM

## 2017-11-06 DIAGNOSIS — K219 Gastro-esophageal reflux disease without esophagitis: Secondary | ICD-10-CM | POA: Diagnosis not present

## 2017-11-06 MED ORDER — AMITRIPTYLINE HCL 75 MG PO TABS: 75.0000 mg | ORAL_TABLET | Freq: Every day | ORAL | 3 refills | Status: DC

## 2017-11-06 MED ORDER — MELOXICAM 15 MG PO TABS: 15.0000 mg | ORAL_TABLET | Freq: Every day | ORAL | 1 refills | Status: DC

## 2017-11-06 MED ORDER — FUROSEMIDE 20 MG PO TABS: 20.0000 mg | ORAL_TABLET | Freq: Every day | ORAL | 1 refills | Status: DC

## 2017-11-06 MED ORDER — ATORVASTATIN CALCIUM 40 MG PO TABS: ORAL_TABLET | ORAL | 1 refills | Status: DC

## 2017-11-06 NOTE — Patient Instructions (Signed)
Stress and Stress Management Stress is a normal reaction to life events. It is what you feel when life demands more than you are used to or more than you can handle. Some stress can be useful. For example, the stress reaction can help you catch the last bus of the day, study for a test, or meet a deadline at work. But stress that occurs too often or for too long can cause problems. It can affect your emotional health and interfere with relationships and normal daily activities. Too much stress can weaken your immune system and increase your risk for physical illness. If you already have a medical problem, stress can make it worse. What are the causes? All sorts of life events may cause stress. An event that causes stress for one person may not be stressful for another person. Major life events commonly cause stress. These may be positive or negative. Examples include losing your job, moving into a new home, getting married, having a baby, or losing a loved one. Less obvious life events may also cause stress, especially if they occur day after day or in combination. Examples include working long hours, driving in traffic, caring for children, being in debt, or being in a difficult relationship. What are the signs or symptoms? Stress may cause emotional symptoms including, the following:  Anxiety. This is feeling worried, afraid, on edge, overwhelmed, or out of control.  Anger. This is feeling irritated or impatient.  Depression. This is feeling sad, down, helpless, or guilty.  Difficulty focusing, remembering, or making decisions.  Stress may cause physical symptoms, including the following:  Aches and pains. These may affect your head, neck, back, stomach, or other areas of your body.  Tight muscles or clenched jaw.  Low energy or trouble sleeping.  Stress may cause unhealthy behaviors, including the following:  Eating to feel better (overeating) or skipping meals.  Sleeping too little,  too much, or both.  Working too much or putting off tasks (procrastination).  Smoking, drinking alcohol, or using drugs to feel better.  How is this diagnosed? Stress is diagnosed through an assessment by your health care provider. Your health care provider will ask questions about your symptoms and any stressful life events.Your health care provider will also ask about your medical history and may order blood tests or other tests. Certain medical conditions and medicine can cause physical symptoms similar to stress. Mental illness can cause emotional symptoms and unhealthy behaviors similar to stress. Your health care provider may refer you to a mental health professional for further evaluation. How is this treated? Stress management is the recommended treatment for stress.The goals of stress management are reducing stressful life events and coping with stress in healthy ways. Techniques for reducing stressful life events include the following:  Stress identification. Self-monitor for stress and identify what causes stress for you. These skills may help you to avoid some stressful events.  Time management. Set your priorities, keep a calendar of events, and learn to say "no." These tools can help you avoid making too many commitments.  Techniques for coping with stress include the following:  Rethinking the problem. Try to think realistically about stressful events rather than ignoring them or overreacting. Try to find the positives in a stressful situation rather than focusing on the negatives.  Exercise. Physical exercise can release both physical and emotional tension. The key is to find a form of exercise you enjoy and do it regularly.  Relaxation techniques. These relax the body and  mind. Examples include yoga, meditation, tai chi, biofeedback, deep breathing, progressive muscle relaxation, listening to music, being out in nature, journaling, and other hobbies. Again, the key is to find  one or more that you enjoy and can do regularly.  Healthy lifestyle. Eat a balanced diet, get plenty of sleep, and do not smoke. Avoid using alcohol or drugs to relax.  Strong support network. Spend time with family, friends, or other people you enjoy being around.Express your feelings and talk things over with someone you trust.  Counseling or talktherapy with a mental health professional may be helpful if you are having difficulty managing stress on your own. Medicine is typically not recommended for the treatment of stress.Talk to your health care provider if you think you need medicine for symptoms of stress. Follow these instructions at home:  Keep all follow-up visits as directed by your health care provider.  Take all medicines as directed by your health care provider. Contact a health care provider if:  Your symptoms get worse or you start having new symptoms.  You feel overwhelmed by your problems and can no longer manage them on your own. Get help right away if:  You feel like hurting yourself or someone else. This information is not intended to replace advice given to you by your health care provider. Make sure you discuss any questions you have with your health care provider. Document Released: 04/29/2001 Document Revised: 04/10/2016 Document Reviewed: 06/28/2013 Elsevier Interactive Patient Education  2017 [employer] Inc.  

## 2017-11-06 NOTE — Progress Notes (Signed)
Subjective:    Patient ID: Minde Gash, female    DOB: 09-24-1986, 31 y.o.   MRN: [id number]  HPI  Rushie Ludolph is here today for follow up of chronic medical problem.  Outpatient Encounter Medications as of 11/06/2017  Medication Sig  . acetaZOLAMIDE (DIAMOX) 250 MG tablet TAKE 1 TABLET EVERY MORNING, TAKE 1 TABLET AT 2PM, AND TAKE 2 TABLETSAT BEDTIME  . albuterol (PROVENTIL HFA;VENTOLIN HFA) 108 (90 BASE) MCG/ACT inhaler Inhale 2 puffs into the lungs every 6 (six) hours as needed for wheezing or shortness of breath.  Marland Kitchen amitriptyline (ELAVIL) 75 MG tablet TAKE ONE TABLET DAILY AT BEDTIME  . atorvastatin (LIPITOR) 40 MG tablet TAKE ONE (1) TABLET EACH DAY  . baclofen (LIORESAL) 20 MG tablet TAKE ONE TABLET FOUR TIMES DAILY  . Beclomethasone Dipropionate (QNASL CHILDRENS) 40 MCG/ACT AERS Place into the nose.  . Calcium-Magnesium-Vitamin D (CALCIUM 1200+D3 PO) Take 1 tablet by mouth daily.  . cetirizine (ZYRTEC) 10 MG tablet Take 10 mg by mouth daily.  . Cholecalciferol (VITAMIN D) 2000 UNITS tablet Take 2,000 Units by mouth daily.  . cycloSPORINE (RESTASIS) 0.05 % ophthalmic emulsion Place 1 drop into both eyes 2 (two) times daily.  Marland Kitchen dexlansoprazole (DEXILANT) 60 MG capsule TAKE ONE (1) CAPSULE EACH DAY  . docusate sodium (COLACE) 100 MG capsule Take 100 mg by mouth 2 (two) times daily. PRN  . EPINEPHRINE 0.3 mg/0.3 mL IJ SOAJ injection USE AS DIRECTED  . FORTEO 600 MCG/2.4ML SOLN Inject 20 mcg into the skin at bedtime.  . furosemide (LASIX) 20 MG tablet TAKE ONE (1) TABLET EACH DAY  . gabapentin (NEURONTIN) 600 MG tablet TAKE ONE TABLET FIVE TIMES DAILY  . ipratropium-albuterol (DUONEB) 0.5-2.5 (3) MG/3ML SOLN Take 3 mLs by nebulization every 4 (four) hours as needed.  Marland Kitchen L-Methylfolate-Algae (L-METHYLFOLATE FORTE) 15-90.314 MG CAPS TAKE ONE (1) CAPSULE EACH DAY  . medroxyPROGESTERone (DEPO-PROVERA) 150 MG/ML injection Inject 150 mg into the muscle as directed. Every 2 months  .  meloxicam (MOBIC) 15 MG tablet TAKE ONE (1) TABLET EACH DAY  . meloxicam (MOBIC) 15 MG tablet TAKE ONE (1) TABLET EACH DAY  . metoprolol succinate (TOPROL-XL) 25 MG 24 hr tablet TAKE ONE (1) TABLET EACH DAY  . Miconazole 50 MG TABS Place 1 tablet (50 mg total) inside cheek daily.  . montelukast (SINGULAIR) 10 MG tablet TAKE ONE TABLET DAILY AT BEDTIME  . nystatin (MYCOSTATIN) 100000 UNIT/ML suspension   . nystatin-triamcinolone ointment (MYCOLOG) Apply 1 application topically 2 (two) times daily.  . Omega 3 1000 MG CAPS Take 1 capsule by mouth daily.  Marland Kitchen oxyCODONE-acetaminophen (PERCOCET) 7.5-325 MG tablet Take 1 tablet by mouth 5 (five) times daily as needed for severe pain. No More Than 5 a day  . predniSONE (DELTASONE) 5 MG tablet Take 1 tablet (5 mg total) by mouth every morning.  . promethazine (PHENERGAN) 12.5 MG tablet Take 1 tablet (12.5 mg total) by mouth every 8 (eight) hours as needed for nausea or vomiting.  . promethazine (PHENERGAN) 25 MG tablet TAKE 1/2 TABLET EVERY 8 HOURS AS NEEDED FOR NAUSEA AND VOMITING  . tamsulosin (FLOMAX) 0.4 MG CAPS capsule TAKE ONE (1) CAPSULE EACH DAY  . vitamin E (VITAMIN E) 400 UNIT capsule Take 400 Units by mouth daily.    1. Tachycardia, paroxysmal (HCC)  Patient says she still has occasionaly but has done well. Caffeine really affects her heart rate  2. Gastroesophageal reflux disease without esophagitis  dexilant is the only need  that helps with her reflux  3. Irritable bowel syndrome with constipation  Has been doing well with constipation, the baclofen is working well.  4. Dysphagia, unspecified type  gets choked on occasion. Tries to make a consciences effort to chew food well  5. Thyroid disease  Not having any problems that she is aware of.  6. Seizures (HCC)  Has not had a seizure in over 6 months- she is working on trying to get her drivers licence back  7. Recurrent major depressive disorder, in full remission [facility])  She is  currently not on an antidepressant. She is doing quite well since she got custody of her foster child.  8. Mixed hyperlipidemia  Tries to watch diet but does not do a very good job  9. Inflammatory autoimmune disorder (HCC)  Saw specialist on 10/14/17. No changes were made to current care.  10. Systemic lupus erythematosus, unspecified SLE type, unspecified organ involvement status (HCC)  They are still not sure if she has lupus or not. They know s=he has autoimmune biut still does not always test positive for lupus.    New complaints: None today  Social history: Lives with husband and foster child. Is on disability. Doing quite well since becoming foster parent.    Review of Systems  Constitutional: Negative.   HENT: Negative.   Respiratory: Negative.   Cardiovascular: Positive for palpitations (occasionally).  Gastrointestinal: Negative.   Endocrine: Positive for cold intolerance and heat intolerance.  Genitourinary: Negative.   Musculoskeletal: Positive for arthralgias, back pain and myalgias.  Neurological: Positive for headaches.  Psychiatric/Behavioral: Negative.        Objective:   Physical Exam  Constitutional: She is oriented to person, place, and time. She appears well-developed and well-nourished.  HENT:  Nose: Nose normal.  Mouth/Throat: Oropharynx is clear and moist.  Eyes: EOM are normal.  Neck: Trachea normal, normal range of motion and full passive range of motion without pain. Neck supple. No JVD present. Carotid bruit is not present. No thyromegaly present.  Cardiovascular: Normal rate, regular rhythm, normal heart sounds and intact distal pulses. Exam reveals no gallop and no friction rub.  No murmur heard. Pulmonary/Chest: Effort normal and breath sounds normal.  Abdominal: Soft. Bowel sounds are normal. She exhibits no distension and no mass. There is no tenderness.  Musculoskeletal: Normal range of motion.  Gait slow and steady Spastic at times when  walking  Lymphadenopathy:    She has no cervical adenopathy.  Neurological: She is alert and oriented to person, place, and time. She has normal reflexes.  Skin: Skin is warm and dry.  Psychiatric: She has a normal mood and affect. Her behavior is normal. Judgment and thought content normal.   BP 121/79   Pulse 91   Temp (!) 97.1 F (36.2 C) (Oral)   Ht 5\' 8"  (1.727 m)   Wt 233 lb (105.7 kg)   LMP  (LMP Unknown) Comment: depo  BMI 35.43 kg/m      Assessment & Plan:  1. Tachycardia, paroxysmal (HCC) Avoid caffeine  2. Gastroesophageal reflux disease without esophagitis Avoid spicy foods Do not eat 2 hours prior to bedtime  3. Irritable bowel syndrome with constipation Watch diet - amitriptyline (ELAVIL) 75 MG tablet; Take 1 tablet (75 mg total) by mouth at bedtime.  Dispense: 90 tablet; Refill: 3  4. Dysphagia, unspecified type Chew foods real well - CBC with Differential/Platelet  5. Thyroid disease - Thyroid Panel With TSH  6. Seizures (HCC)  7.  Recurrent major depressive disorder, in full remission (HCC) Stress management  8. Mixed hyperlipidemia Low fat diet - atorvastatin (LIPITOR) 40 MG tablet; TAKE ONE (1) TABLET EACH DAY  Dispense: 90 tablet; Refill: 1 - CMP14+EGFR - Lipid panel  9. Inflammatory autoimmune disorder (HCC) Keep follow up with specialist  10. Systemic lupus erythematosus, unspecified SLE type, unspecified organ involvement status (HCC)  11. Fibromyalgia exercise - meloxicam (MOBIC) 15 MG tablet; Take 1 tablet (15 mg total) by mouth daily.  Dispense: 90 tablet; Refill: 1  12. Peripheral edema Elevate legs when sitting - furosemide (LASIX) 20 MG tablet; Take 1 tablet (20 mg total) by mouth daily.  Dispense: 90 tablet; Refill: 1    Labs pending Health maintenance reviewed Diet and exercise encouraged Continue all meds Follow up  In 3 months   Mary-Margaret Daphine Deutscher, FNP

## 2017-11-07 LAB — CBC WITH DIFFERENTIAL/PLATELET
BASOS ABS: 0.1 10*3/uL (ref 0.0–0.2)
Basos: 1 %
EOS (ABSOLUTE): 0.2 10*3/uL (ref 0.0–0.4)
Eos: 1 %
HEMOGLOBIN: 11.7 g/dL (ref 11.1–15.9)
Hematocrit: 35.6 % (ref 34.0–46.6)
Immature Grans (Abs): 0.1 10*3/uL (ref 0.0–0.1)
Immature Granulocytes: 1 %
LYMPHS ABS: 2.9 10*3/uL (ref 0.7–3.1)
Lymphs: 23 %
MCH: 27.9 pg (ref 26.6–33.0)
MCHC: 32.9 g/dL (ref 31.5–35.7)
MCV: 85 fL (ref 79–97)
MONOCYTES: 8 %
MONOS ABS: 0.9 10*3/uL (ref 0.1–0.9)
NEUTROS ABS: 8.4 10*3/uL — AB (ref 1.4–7.0)
Neutrophils: 66 %
Platelets: 332 10*3/uL (ref 150–379)
RBC: 4.2 x10E6/uL (ref 3.77–5.28)
RDW: 14.9 % (ref 12.3–15.4)
WBC: 12.6 10*3/uL — AB (ref 3.4–10.8)

## 2017-11-07 LAB — LIPID PANEL
CHOL/HDL RATIO: 3.6 ratio (ref 0.0–4.4)
Cholesterol, Total: 213 mg/dL — ABNORMAL HIGH (ref 100–199)
HDL: 59 mg/dL (ref >39)
LDL Calculated: 110 mg/dL — ABNORMAL HIGH (ref 0–99)
Triglycerides: 220 mg/dL — ABNORMAL HIGH (ref 0–149)
VLDL Cholesterol Cal: 44 mg/dL — ABNORMAL HIGH (ref 5–40)

## 2017-11-07 LAB — CMP14+EGFR
A/G RATIO: 1.8 (ref 1.2–2.2)
ALT: 16 IU/L (ref 0–32)
AST: 10 IU/L (ref 0–40)
Albumin: 4.2 g/dL (ref 3.5–5.5)
Alkaline Phosphatase: 131 IU/L — ABNORMAL HIGH (ref 39–117)
BUN/Creatinine Ratio: 11 (ref 9–23)
BUN: 8 mg/dL (ref 6–20)
Bilirubin Total: <0.2 mg/dL (ref 0.0–1.2)
CALCIUM: 9 mg/dL (ref 8.7–10.2)
CO2: 20 mmol/L (ref 20–29)
Chloride: 108 mmol/L — ABNORMAL HIGH (ref 96–106)
Creatinine, Ser: 0.72 mg/dL (ref 0.57–1.00)
GFR, EST AFRICAN AMERICAN: 127 mL/min/{1.73_m2} (ref >59)
GFR, EST NON AFRICAN AMERICAN: 110 mL/min/{1.73_m2} (ref >59)
GLOBULIN, TOTAL: 2.3 g/dL (ref 1.5–4.5)
Glucose: 100 mg/dL — ABNORMAL HIGH (ref 65–99)
POTASSIUM: 3.7 mmol/L (ref 3.5–5.2)
SODIUM: 142 mmol/L (ref 134–144)
TOTAL PROTEIN: 6.5 g/dL (ref 6.0–8.5)

## 2017-11-07 LAB — THYROID PANEL WITH TSH
FREE THYROXINE INDEX: 1.3 (ref 1.2–4.9)
T3 UPTAKE RATIO: 25 % (ref 24–39)
T4 TOTAL: 5.2 ug/dL (ref 4.5–12.0)
TSH: 1.47 u[IU]/mL (ref 0.450–4.500)

## 2017-11-12 ENCOUNTER — Encounter: Payer: BLUE CROSS/BLUE SHIELD | Admitting: Registered Nurse

## 2017-11-20 ENCOUNTER — Other Ambulatory Visit: Payer: Self-pay

## 2017-11-20 ENCOUNTER — Encounter: Payer: BLUE CROSS/BLUE SHIELD | Attending: Physical Medicine & Rehabilitation | Admitting: Registered Nurse

## 2017-11-20 ENCOUNTER — Encounter: Payer: Self-pay | Admitting: Registered Nurse

## 2017-11-20 VITALS — BP 113/81 | HR 89

## 2017-11-20 DIAGNOSIS — R569 Unspecified convulsions: Secondary | ICD-10-CM | POA: Diagnosis not present

## 2017-11-20 DIAGNOSIS — Z79899 Other long term (current) drug therapy: Secondary | ICD-10-CM

## 2017-11-20 DIAGNOSIS — M6249 Contracture of muscle, multiple sites: Secondary | ICD-10-CM | POA: Diagnosis not present

## 2017-11-20 DIAGNOSIS — M329 Systemic lupus erythematosus, unspecified: Secondary | ICD-10-CM | POA: Insufficient documentation

## 2017-11-20 DIAGNOSIS — M62838 Other muscle spasm: Secondary | ICD-10-CM | POA: Diagnosis not present

## 2017-11-20 DIAGNOSIS — G894 Chronic pain syndrome: Secondary | ICD-10-CM | POA: Diagnosis not present

## 2017-11-20 DIAGNOSIS — M545 Low back pain, unspecified: Secondary | ICD-10-CM

## 2017-11-20 DIAGNOSIS — M797 Fibromyalgia: Secondary | ICD-10-CM | POA: Diagnosis not present

## 2017-11-20 DIAGNOSIS — D8989 Other specified disorders involving the immune mechanism, not elsewhere classified: Secondary | ICD-10-CM

## 2017-11-20 DIAGNOSIS — M546 Pain in thoracic spine: Secondary | ICD-10-CM | POA: Diagnosis not present

## 2017-11-20 DIAGNOSIS — Z5181 Encounter for therapeutic drug level monitoring: Secondary | ICD-10-CM

## 2017-11-20 DIAGNOSIS — G8929 Other chronic pain: Secondary | ICD-10-CM | POA: Diagnosis not present

## 2017-11-20 DIAGNOSIS — G609 Hereditary and idiopathic neuropathy, unspecified: Secondary | ICD-10-CM | POA: Diagnosis not present

## 2017-11-20 MED ORDER — OXYCODONE-ACETAMINOPHEN 7.5-325 MG PO TABS: 1.0000 | ORAL_TABLET | Freq: Every day | ORAL | 0 refills | Status: DC | PRN

## 2017-11-20 MED ORDER — DICLOFENAC SODIUM 75 MG PO TBEC: 75.0000 mg | DELAYED_RELEASE_TABLET | Freq: Two times a day (BID) | ORAL | 2 refills | Status: DC

## 2017-11-20 NOTE — Progress Notes (Signed)
Subjective:    Patient ID: Autumn Davis, female    DOB: 03/28/1986, 32 y.o.   MRN: [id number]  HPI: [name] is a 32year old female who returns for follow up appointment for chronic pain and medication refill. She states her pain is located in her bilateral ribs, mid-lower back radiating into her bilateral lower extremities and bilateral feet with tingling and burning. She rates her pain 5 .Her current exercise regime is performing stretching exercises and walking.  Autumn Davis Morphine equivalent is 52.50 MME.    Last UDS was performed on 04/16/2017, it was consistent.   Pain Inventory Average Pain 8 Pain Right Now 5 My pain is constant, sharp, burning, stabbing, tingling and aching  In the last 24 hours, has pain interfered with the following? General activity 7 Relation with others 6 Enjoyment of life 4 What TIME of day is your pain at its worst? evening and night, night Sleep (in general) Poor  Pain is worse with: walking, bending, sitting, inactivity and standing Pain improves with: rest, heat/ice, therapy/exercise, pacing activities, medication and TENS Relief from Meds: 5  Mobility walk without assistance walk with assistance use a cane use a walker ability to climb steps?  yes do you drive?  no use a wheelchair transfers alone Do you have any goals in this area?  yes  Function not employed: date last employed 2012 I need assistance with the following:  meal prep, household duties and shopping  Neuro/Psych bladder control problems bowel control problems weakness numbness tremor tingling trouble walking spasms dizziness  Prior Studies Any changes since last visit?  no  Physicians involved in your care Any changes since last visit?  no   Family History  Problem Relation Age of Onset  . Thyroid disease Mother   . Colon polyps Father   . Lung cancer Maternal Grandmother   . Cancer Paternal Grandmother    colon/pancreatiec/lymphoma  . Irritable bowel syndrome Brother    Social History   Socioeconomic History  . Marital status: Married    Spouse name: Not on file  . Number of children: Not on file  . Years of education: Not on file  . Highest education level: Not on file  Social Needs  . Financial resource strain: Not on file  . Food insecurity - worry: Not on file  . Food insecurity - inability: Not on file  . Transportation needs - medical: Not on file  . Transportation needs - non-medical: Not on file  Occupational History  . Occupation: disabled    Employer: [employer]  Tobacco Use  . Smoking status: Never Smoker  . Smokeless tobacco: Never Used  Substance and Sexual Activity  . Alcohol use: No    Alcohol/week: 0.0 oz  . Drug use: No  . Sexual activity: Not on file  Other Topics Concern  . Not on file  Social History Narrative  . Not on file   Past Surgical History:  Procedure Laterality Date  . 24 HOUR PH STUDY N/A 10/29/2015   Procedure: 24 HOUR PH STUDY;  Surgeon: Iva Boop, MD;  Location: [facility];  Service: Endoscopy;  Laterality: N/A;  . COLONOSCOPY    . ESOPHAGEAL MANOMETRY N/A 10/29/2015   Procedure: ESOPHAGEAL MANOMETRY (EM);  Surgeon: Iva Boop, MD;  Location: [facility];  Service: Endoscopy;  Laterality: N/A;  . KNEE SURGERY  2002/2003   bil  . RECTAL SURGERY  correction of prolapse   2010  Past Medical History:  Diagnosis Date  . Allergy   . Arthritis    HANDS,HIPS,KNEES  . Bronchitis, chronic/intermittent 01/22/2012  . Depression 01/22/2012  . GERD (gastroesophageal reflux disease)   . Hyperlipidemia   . IBS (irritable bowel syndrome) 01/22/2012  . Lupus   . Neuromuscular disorder (HCC)   . Osteoporosis   . Seizures (HCC)   . SOB (shortness of breath)   . Thyroid disease    There were no vitals taken for this visit.  Opioid Risk Score:  1 Fall Risk Score:  `1  Depression screen PHQ 2/9  Depression screen [facility]  2/9 11/20/2017 11/06/2017 05/14/2017 02/06/2017 10/23/2016 07/10/2016 04/17/2016  Decreased Interest 1 1 0 0 1 0 0  Down, Depressed, Hopeless 1 1 0 0 0 0 0  PHQ - 2 Score 2 2 0 0 1 0 0  Altered sleeping - 3 - - - - -  Tired, decreased energy - 2 - - - - -  Change in appetite - 2 - - - - -  Feeling bad or failure about yourself  - 0 - - - - -  Trouble concentrating - 1 - - - - -  Moving slowly or fidgety/restless - 0 - - - - -  Suicidal thoughts - 0 - - - - -  PHQ-9 Score - 10 - - - - -     Review of Systems  HENT: Negative.   Eyes: Negative.   Respiratory: Negative.   Cardiovascular: Negative.   Gastrointestinal: Positive for constipation and diarrhea.  Endocrine: Negative.   Genitourinary: Positive for difficulty urinating, frequency and urgency.       Incontinence  Musculoskeletal: Positive for arthralgias, back pain and gait problem.       Spasms   Skin: Negative.   Allergic/Immunologic: Negative.   Neurological: Positive for tremors, weakness and numbness.       Tingling  Hematological: Negative.   Psychiatric/Behavioral: Negative.   All other systems reviewed and are negative.      Objective:   Physical Exam  Constitutional: She is oriented to person, place, and time. She appears well-developed and well-nourished.  HENT:  Head: Normocephalic and atraumatic.  Neck: Normal range of motion. Neck supple.  Cardiovascular: Normal rate and regular rhythm.  Pulmonary/Chest: Effort normal and breath sounds normal.  Musculoskeletal:  Normal Muscle Bulk and Muscle Testing Reveals: Upper Extremities: Full ROM and Muscle Strength 5/5 Thoracic Paraspinal Tenderness: T-7-T-9 Mainly Right Side Lower Extremities: Full ROM and Muscle Strength 5/5 Arises from Table with ease Narrow Based Gait  Neurological: She is alert and oriented to person, place, and time.  Skin: Skin is warm and dry.  Psychiatric: She has a normal mood and affect.  Nursing note and vitals reviewed.           Assessment & Plan:  1. Chronic seizure disorder: No seizure's. Continue Gabapentin. Neurology Following. 11/20/2017 2. Chronic muscle spasms, weakness with associated pain disorder: Continue Baclofen and MOBIC. Continue with Exercise regime. 11/20/2017 3. Chronic dysphagia: GI Following. 11/20/2017 4. Anxiety with depression : Stable. Continue Elavil. 11/20/2017 5. Fibromyalgia/ Rib Pain/Chronic Pain: RX: Diclofenac/ Meloxicam will be on hold if insurance covers and co-pay reasonable, per Dr. Riley Kill note. Autumn Davis asked to call office when she begins the Diclofenac, she verbalizes understanding.  Continue with exercise and heat Therapy. 11/20/2017 Refilled: oxyCODONE 7.5/325mg  onetablet 5 times daily as needed #150. We will continue opioid monitoring program, this consists of regular clinic visits, examinations, urine drug  screen, pill counts as well as use of [place] Controlled Substance Reporting System. 6. Peripheral Neuropathy: Continue  Gabapentin: 11/20/2017  20 minutes of face to face patient care time was spent during this visit. All questions were encouraged and answered.  F/U in 1 month

## 2017-12-01 DIAGNOSIS — Z13820 Encounter for screening for osteoporosis: Secondary | ICD-10-CM | POA: Diagnosis not present

## 2017-12-15 ENCOUNTER — Ambulatory Visit: Payer: BLUE CROSS/BLUE SHIELD | Admitting: Registered Nurse

## 2017-12-23 ENCOUNTER — Ambulatory Visit (INDEPENDENT_AMBULATORY_CARE_PROVIDER_SITE_OTHER): Payer: BLUE CROSS/BLUE SHIELD | Admitting: Podiatry

## 2017-12-23 ENCOUNTER — Encounter: Payer: Self-pay | Admitting: Podiatry

## 2017-12-23 ENCOUNTER — Other Ambulatory Visit: Payer: Self-pay | Admitting: Nurse Practitioner

## 2017-12-23 DIAGNOSIS — L600 Ingrowing nail: Secondary | ICD-10-CM | POA: Diagnosis not present

## 2017-12-23 NOTE — Progress Notes (Signed)
Subjective:   Patient ID: Autumn Davis, female   DOB: 32 y.o.   MRN: [id number]   HPI Patient states she has had her nail borders removed numerous times along with a total nail in the corners are very aggravating for her ingrown spicules that become painful.  States she tries to soak them and tries to cut them out herself but she is worried about doing this with her health and her history of neuropathy and autoimmune disease.  Patient does not smoke and likes to be active   Review of Systems  All other systems reviewed and are negative.       Objective:  Physical Exam  Constitutional: She appears well-developed and well-nourished.  Cardiovascular: Intact distal pulses.  Pulmonary/Chest: Effort normal.  Musculoskeletal: Normal range of motion.  Neurological: She is alert.  Skin: Skin is warm.  Nursing note and vitals reviewed.   Neurovascular status is found to be intact with muscle strength adequate range of motion within normal limits.  Patient is noted to have incurvation of the hallux nails medial lateral borders with irritated spicules with no drainage or redness associated with this.  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  Patient wants surgery and I did review there is no guarantee as far as healing and she is willing to accept risk.  I infiltrated each hallux 60 mg Xylocaine Marcaine mixture removed the ingrown toenail components on the medial lateral side of both hallux exposed matrix and applied phenol 3 applications 30 seconds followed by alcohol lavage sterile dressing.  Gave instructions on soaks and reappoint

## 2017-12-23 NOTE — Patient Instructions (Signed)

## 2017-12-29 ENCOUNTER — Encounter: Payer: BLUE CROSS/BLUE SHIELD | Attending: Physical Medicine & Rehabilitation | Admitting: Registered Nurse

## 2017-12-29 ENCOUNTER — Encounter: Payer: Self-pay | Admitting: Registered Nurse

## 2017-12-29 VITALS — BP 126/85 | HR 89

## 2017-12-29 DIAGNOSIS — D8989 Other specified disorders involving the immune mechanism, not elsewhere classified: Secondary | ICD-10-CM

## 2017-12-29 DIAGNOSIS — M6249 Contracture of muscle, multiple sites: Secondary | ICD-10-CM | POA: Diagnosis not present

## 2017-12-29 DIAGNOSIS — Z79899 Other long term (current) drug therapy: Secondary | ICD-10-CM

## 2017-12-29 DIAGNOSIS — G8929 Other chronic pain: Secondary | ICD-10-CM | POA: Diagnosis not present

## 2017-12-29 DIAGNOSIS — R569 Unspecified convulsions: Secondary | ICD-10-CM

## 2017-12-29 DIAGNOSIS — M329 Systemic lupus erythematosus, unspecified: Secondary | ICD-10-CM | POA: Diagnosis not present

## 2017-12-29 DIAGNOSIS — G609 Hereditary and idiopathic neuropathy, unspecified: Secondary | ICD-10-CM | POA: Diagnosis not present

## 2017-12-29 DIAGNOSIS — M545 Low back pain: Secondary | ICD-10-CM | POA: Diagnosis not present

## 2017-12-29 DIAGNOSIS — M797 Fibromyalgia: Secondary | ICD-10-CM | POA: Diagnosis not present

## 2017-12-29 DIAGNOSIS — M546 Pain in thoracic spine: Secondary | ICD-10-CM | POA: Diagnosis not present

## 2017-12-29 DIAGNOSIS — M62838 Other muscle spasm: Secondary | ICD-10-CM | POA: Diagnosis not present

## 2017-12-29 DIAGNOSIS — Z5181 Encounter for therapeutic drug level monitoring: Secondary | ICD-10-CM | POA: Diagnosis not present

## 2017-12-29 MED ORDER — OXYCODONE-ACETAMINOPHEN 7.5-325 MG PO TABS: 1.0000 | ORAL_TABLET | Freq: Every day | ORAL | 0 refills | Status: DC | PRN

## 2017-12-29 NOTE — Progress Notes (Signed)
Subjective:    Patient ID: Autumn Davis, female    DOB: 1986/09/12, 32 y.o.   MRN: [id number]  HPI: Autumn Davis is a 32year old female who returns for follow up appointment for chronic pain and medication refill. She states her pain is located in her mid-back (flank pain) and bilateral feet pain. She rates her pain 6. .Her current exercise regime is performing stretching exercises and walking.  Autumn Davis was seen by Podiatry for Ingrown toe nails, note was reviewed.   Autumn Davis Morphine equivalent is 50.63 MME.    Last UDS was performed on 04/16/2017, it was consistent. UDS ordered today.   Pain Inventory Average Pain 8 Pain Right Now 6 My pain is constant, sharp, burning, dull, stabbing, tingling and aching  In the last 24 hours, has pain interfered with the following? General activity 8 Relation with others 5 Enjoyment of life 6 What TIME of day is your pain at its worst? Evening and night  Sleep (in general) NA  Pain is worse with: walking, bending, sitting, inactivity, standing and some activites Pain improves with: rest, heat/ice, therapy/exercise, pacing activities and medication Relief from Meds: 0  Mobility walk without assistance walk with assistance use a cane use a walker ability to climb steps?  yes do you drive?  no use a wheelchair transfers alone Do you have any goals in this area?  yes  Function disabled: date disabled 2012 I need assistance with the following:  meal prep, household duties and shopping Do you have any goals in this area?  yes  Neuro/Psych bladder control problems bowel control problems weakness numbness tremor tingling  Prior Studies Any changes since last visit?  yes bone scan  Physicians involved in your care Any changes since last visit?  yes DPM Cristie Hem   Family History  Problem Relation Age of Onset  . Thyroid disease Mother   . Colon polyps Father   . Lung cancer Maternal  Grandmother   . Cancer Paternal Grandmother        colon/pancreatiec/lymphoma  . Irritable bowel syndrome Brother    Social History   Socioeconomic History  . Marital status: Married    Spouse name: Not on file  . Number of children: Not on file  . Years of education: Not on file  . Highest education level: Not on file  Social Needs  . Financial resource strain: Not on file  . Food insecurity - worry: Not on file  . Food insecurity - inability: Not on file  . Transportation needs - medical: Not on file  . Transportation needs - non-medical: Not on file  Occupational History  . Occupation: disabled    Employer: [employer]  Tobacco Use  . Smoking status: Never Smoker  . Smokeless tobacco: Never Used  Substance and Sexual Activity  . Alcohol use: No    Alcohol/week: 0.0 oz  . Drug use: No  . Sexual activity: Not on file  Other Topics Concern  . Not on file  Social History Narrative  . Not on file   Past Surgical History:  Procedure Laterality Date  . 24 HOUR PH STUDY N/A 10/29/2015   Procedure: 24 HOUR PH STUDY;  Surgeon: Iva Boop, MD;  Location: [facility];  Service: Endoscopy;  Laterality: N/A;  . COLONOSCOPY    . ESOPHAGEAL MANOMETRY N/A 10/29/2015   Procedure: ESOPHAGEAL MANOMETRY (EM);  Surgeon: Iva Boop, MD;  Location: [facility];  Service: Endoscopy;  Laterality: N/A;  . KNEE SURGERY  2002/2003   bil  . RECTAL SURGERY  correction of prolapse   2010   Past Medical History:  Diagnosis Date  . Allergy   . Arthritis    HANDS,HIPS,KNEES  . Bronchitis, chronic/intermittent 01/22/2012  . Depression 01/22/2012  . GERD (gastroesophageal reflux disease)   . Hyperlipidemia   . IBS (irritable bowel syndrome) 01/22/2012  . Lupus   . Neuromuscular disorder (HCC)   . Osteoporosis   . Seizures (HCC)   . SOB (shortness of breath)   . Thyroid disease    There were no vitals taken for this visit.  Opioid Risk Score:   Fall Risk Score:   `1  Depression screen PHQ 2/9  Depression screen [facility] 2/9 11/20/2017 11/06/2017 05/14/2017 02/06/2017 10/23/2016 07/10/2016 04/17/2016  Decreased Interest 1 1 0 0 1 0 0  Down, Depressed, Hopeless 1 1 0 0 0 0 0  PHQ - 2 Score 2 2 0 0 1 0 0  Altered sleeping - 3 - - - - -  Tired, decreased energy - 2 - - - - -  Change in appetite - 2 - - - - -  Feeling bad or failure about yourself  - 0 - - - - -  Trouble concentrating - 1 - - - - -  Moving slowly or fidgety/restless - 0 - - - - -  Suicidal thoughts - 0 - - - - -  PHQ-9 Score - 10 - - - - -      Review of Systems  Gastrointestinal:       Bowel control problem  Genitourinary:       Bladder control problems  Musculoskeletal: Positive for gait problem.  Neurological: Positive for dizziness, tremors, weakness and numbness.       Tingling  spasms       Objective:   Physical Exam  Constitutional: She is oriented to person, place, and time. She appears well-developed and well-nourished.  HENT:  Head: Normocephalic and atraumatic.  Neck: Normal range of motion. Neck supple.  Cardiovascular: Normal rate and regular rhythm.  Pulmonary/Chest: Effort normal and breath sounds normal.  Musculoskeletal:  Normal Muscle Bulk and Muscle Testing Reveals: Upper Extremities: Full ROM and Muscle Strength 5/5 Thoracic Paraspinal Tenderness: T-7-T-9 Lower Extremities: Full ROM and Muscle Strength 5/5 Arises from Table with ease Narrow Based Gait  Neurological: She is alert and oriented to person, place, and time.  Skin: Skin is warm and dry.  Psychiatric: She has a normal mood and affect.  Nursing note and vitals reviewed.         Assessment & Plan:  1. Chronic seizure disorder: No seizure's. Continue Gabapentin. Neurology Following. 12/29/2017 2. Chronic muscle spasms, weakness with associated pain disorder: Continue Baclofen and MOBIC. Continue with Exercise regime. 12/29/2017 3. Chronic dysphagia: GI Following. 12/29/2017 4. Anxiety  with depression : Stable. Continue Elavil. 12/29/2017 5. Fibromyalgia/ Rib Pain/Chronic Pain: Continue Diclofenac: Continue with exercise and heat Therapy. 12/29/2017 Refilled: oxyCODONE 7.5/325mg  onetablet 5 times daily as needed #150. We will continue opioid monitoring program, this consists of regular clinic visits, examinations, urine drug screen, pill counts as well as use of [place] Controlled Substance Reporting System. 6. Peripheral Neuropathy: Continue  Gabapentin: 12/29/2017  20 minutes of face to face patient care time was spent during this visit. All questions were encouraged and answered.  F/U in 1 month

## 2018-01-03 LAB — TOXASSURE SELECT,+ANTIDEPR,UR

## 2018-01-04 ENCOUNTER — Telehealth: Payer: Self-pay | Admitting: *Deleted

## 2018-01-04 NOTE — Telephone Encounter (Signed)
Urine drug screen for this encounter is consistent for prescribed medication 

## 2018-01-16 ENCOUNTER — Other Ambulatory Visit: Payer: Self-pay | Admitting: Nurse Practitioner

## 2018-01-19 DIAGNOSIS — E282 Polycystic ovarian syndrome: Secondary | ICD-10-CM | POA: Diagnosis not present

## 2018-01-19 DIAGNOSIS — R7301 Impaired fasting glucose: Secondary | ICD-10-CM | POA: Diagnosis not present

## 2018-01-19 DIAGNOSIS — Z7952 Long term (current) use of systemic steroids: Secondary | ICD-10-CM | POA: Diagnosis not present

## 2018-01-19 DIAGNOSIS — N200 Calculus of kidney: Secondary | ICD-10-CM | POA: Diagnosis not present

## 2018-01-19 DIAGNOSIS — M810 Age-related osteoporosis without current pathological fracture: Secondary | ICD-10-CM | POA: Diagnosis not present

## 2018-01-19 DIAGNOSIS — E559 Vitamin D deficiency, unspecified: Secondary | ICD-10-CM | POA: Diagnosis not present

## 2018-02-09 ENCOUNTER — Ambulatory Visit (INDEPENDENT_AMBULATORY_CARE_PROVIDER_SITE_OTHER): Payer: BLUE CROSS/BLUE SHIELD | Admitting: Nurse Practitioner

## 2018-02-09 ENCOUNTER — Encounter: Payer: Self-pay | Admitting: Nurse Practitioner

## 2018-02-09 VITALS — BP 118/73 | HR 85 | Temp 98.8°F | Ht 68.0 in | Wt 226.4 lb

## 2018-02-09 DIAGNOSIS — R569 Unspecified convulsions: Secondary | ICD-10-CM

## 2018-02-09 DIAGNOSIS — Z30013 Encounter for initial prescription of injectable contraceptive: Secondary | ICD-10-CM | POA: Diagnosis not present

## 2018-02-09 DIAGNOSIS — M329 Systemic lupus erythematosus, unspecified: Secondary | ICD-10-CM

## 2018-02-09 DIAGNOSIS — K581 Irritable bowel syndrome with constipation: Secondary | ICD-10-CM | POA: Diagnosis not present

## 2018-02-09 DIAGNOSIS — E079 Disorder of thyroid, unspecified: Secondary | ICD-10-CM | POA: Diagnosis not present

## 2018-02-09 DIAGNOSIS — F3342 Major depressive disorder, recurrent, in full remission: Secondary | ICD-10-CM

## 2018-02-09 DIAGNOSIS — D8989 Other specified disorders involving the immune mechanism, not elsewhere classified: Secondary | ICD-10-CM | POA: Diagnosis not present

## 2018-02-09 DIAGNOSIS — E559 Vitamin D deficiency, unspecified: Secondary | ICD-10-CM

## 2018-02-09 DIAGNOSIS — E782 Mixed hyperlipidemia: Secondary | ICD-10-CM | POA: Diagnosis not present

## 2018-02-09 DIAGNOSIS — K219 Gastro-esophageal reflux disease without esophagitis: Secondary | ICD-10-CM | POA: Diagnosis not present

## 2018-02-09 DIAGNOSIS — I479 Paroxysmal tachycardia, unspecified: Secondary | ICD-10-CM

## 2018-02-09 DIAGNOSIS — R1310 Dysphagia, unspecified: Secondary | ICD-10-CM | POA: Diagnosis not present

## 2018-02-09 DIAGNOSIS — M3219 Other organ or system involvement in systemic lupus erythematosus: Secondary | ICD-10-CM

## 2018-02-09 DIAGNOSIS — R6889 Other general symptoms and signs: Secondary | ICD-10-CM | POA: Diagnosis not present

## 2018-02-09 DIAGNOSIS — R600 Localized edema: Secondary | ICD-10-CM

## 2018-02-09 DIAGNOSIS — R609 Edema, unspecified: Secondary | ICD-10-CM

## 2018-02-09 DIAGNOSIS — M62838 Other muscle spasm: Secondary | ICD-10-CM

## 2018-02-09 MED ORDER — GABAPENTIN 600 MG PO TABS: ORAL_TABLET | ORAL | 2 refills | Status: DC

## 2018-02-09 MED ORDER — ATORVASTATIN CALCIUM 40 MG PO TABS: ORAL_TABLET | ORAL | 1 refills | Status: DC

## 2018-02-09 MED ORDER — DEXLANSOPRAZOLE 60 MG PO CPDR: DELAYED_RELEASE_CAPSULE | ORAL | 1 refills | Status: DC

## 2018-02-09 MED ORDER — MEDROXYPROGESTERONE ACETATE 150 MG/ML IM SUSP
150.0000 mg | INTRAMUSCULAR | Status: AC
  Administered 2018-02-09 – 2019-01-28 (×4): 150 mg via INTRAMUSCULAR

## 2018-02-09 MED ORDER — METOPROLOL SUCCINATE ER 25 MG PO TB24: ORAL_TABLET | ORAL | 1 refills | Status: DC

## 2018-02-09 MED ORDER — FUROSEMIDE 20 MG PO TABS: 20.0000 mg | ORAL_TABLET | Freq: Every day | ORAL | 1 refills | Status: DC

## 2018-02-09 MED ORDER — ACETAZOLAMIDE 250 MG PO TABS: ORAL_TABLET | ORAL | 1 refills | Status: DC

## 2018-02-09 MED ORDER — DICLOFENAC SODIUM 75 MG PO TBEC: 75.0000 mg | DELAYED_RELEASE_TABLET | Freq: Two times a day (BID) | ORAL | 2 refills | Status: DC

## 2018-02-09 MED ORDER — TAMSULOSIN HCL 0.4 MG PO CAPS: ORAL_CAPSULE | ORAL | 0 refills | Status: DC

## 2018-02-09 MED ORDER — BACLOFEN 20 MG PO TABS: ORAL_TABLET | ORAL | 1 refills | Status: DC

## 2018-02-09 MED ORDER — PREDNISONE 5 MG PO TABS: 5.0000 mg | ORAL_TABLET | Freq: Every morning | ORAL | 1 refills | Status: DC

## 2018-02-09 NOTE — Addendum Note (Signed)
Addended by: Quay BurowPOTTER, JANICE K on: 02/09/2018 04:25 PM   Modules accepted: Orders

## 2018-02-09 NOTE — Progress Notes (Addendum)
Subjective:    Patient ID: Autumn Davis, female    DOB: 1986-01-16, 32 y.o.   MRN: [id number]  HPI   Autumn Davis is here today for follow up of chronic medical problem.  Outpatient Encounter Medications as of 02/09/2018  Medication Sig  . acetaZOLAMIDE (DIAMOX) 250 MG tablet TAKE 1 TABLET EVERY MORNING, TAKE 1 TABLET AT 2PM, AND TAKE 2 TABLETSAT BEDTIME  . albuterol (PROVENTIL HFA;VENTOLIN HFA) 108 (90 BASE) MCG/ACT inhaler Inhale 2 puffs into the lungs every 6 (six) hours as needed for wheezing or shortness of breath.  Marland Kitchen amitriptyline (ELAVIL) 75 MG tablet Take 1 tablet (75 mg total) by mouth at bedtime.  Marland Kitchen atorvastatin (LIPITOR) 40 MG tablet TAKE ONE (1) TABLET EACH DAY  . baclofen (LIORESAL) 20 MG tablet TAKE ONE TABLET FOUR TIMES DAILY  . Beclomethasone Dipropionate (QNASL CHILDRENS) 40 MCG/ACT AERS Place into the nose.  . Calcium-Magnesium-Vitamin D (CALCIUM 1200+D3 PO) Take 1 tablet by mouth daily.  . cetirizine (ZYRTEC) 10 MG tablet Take 10 mg by mouth daily.  . Cholecalciferol (VITAMIN D) 2000 UNITS tablet Take 2,000 Units by mouth daily.  . cycloSPORINE (RESTASIS) 0.05 % ophthalmic emulsion Place 1 drop into both eyes 2 (two) times daily.  Marland Kitchen dexlansoprazole (DEXILANT) 60 MG capsule TAKE ONE (1) CAPSULE EACH DAY  . diclofenac (VOLTAREN) 75 MG EC tablet Take 1 tablet (75 mg total) by mouth 2 (two) times daily.  Marland Kitchen docusate sodium (COLACE) 100 MG capsule Take 100 mg by mouth 2 (two) times daily. PRN  . EPINEPHRINE 0.3 mg/0.3 mL IJ SOAJ injection USE AS DIRECTED  . FORTEO 600 MCG/2.4ML SOLN Inject 20 mcg into the skin at bedtime.  . furosemide (LASIX) 20 MG tablet Take 1 tablet (20 mg total) by mouth daily.  Marland Kitchen gabapentin (NEURONTIN) 600 MG tablet TAKE ONE TABLET FIVE TIMES DAILY  . ipratropium-albuterol (DUONEB) 0.5-2.5 (3) MG/3ML SOLN Take 3 mLs by nebulization every 4 (four) hours as needed.  Marland Kitchen L-Methylfolate-Algae (L-METHYLFOLATE FORTE) 15-90.314 MG CAPS TAKE ONE (1)  CAPSULE EACH DAY  . medroxyPROGESTERone (DEPO-PROVERA) 150 MG/ML injection Inject 150 mg into the muscle as directed. Every 2 months  . meloxicam (MOBIC) 15 MG tablet Take 1 tablet (15 mg total) by mouth daily.  . metoprolol succinate (TOPROL-XL) 25 MG 24 hr tablet TAKE ONE (1) TABLET EACH DAY  . Miconazole 50 MG TABS Place 1 tablet (50 mg total) inside cheek daily.  . montelukast (SINGULAIR) 10 MG tablet TAKE ONE TABLET DAILY AT BEDTIME  . nystatin (MYCOSTATIN) 100000 UNIT/ML suspension   . nystatin-triamcinolone ointment (MYCOLOG) Apply 1 application topically 2 (two) times daily.  . Omega 3 1000 MG CAPS Take 1 capsule by mouth daily.  Marland Kitchen oxyCODONE-acetaminophen (PERCOCET) 7.5-325 MG tablet Take 1 tablet by mouth 5 (five) times daily as needed for severe pain. No More Than 5 a day  . predniSONE (DELTASONE) 5 MG tablet Take 1 tablet (5 mg total) by mouth every morning.  . promethazine (PHENERGAN) 12.5 MG tablet Take 1 tablet (12.5 mg total) by mouth every 8 (eight) hours as needed for nausea or vomiting.  . promethazine (PHENERGAN) 25 MG tablet TAKE 1/2 TABLET EVERY 8 HOURS AS NEEDED FOR NAUSEA AND VOMITING  . tamsulosin (FLOMAX) 0.4 MG CAPS capsule TAKE ONE (1) CAPSULE EACH DAY  . vitamin E (VITAMIN E) 400 UNIT capsule Take 400 Units by mouth daily.     1. Tachycardia, paroxysmal (HCC)  Patient says that tachycardia comes and goes with  no real pattern. Has not seen cardiology as of late. denies any chest pain  2. Gastroesophageal reflux disease without esophagitis  Takes dexilant daily- without she has sever heart burn  3. Dysphagia, unspecified type  Swallowing has gotten better. If she does not chew her food good she will get choked  4. Irritable bowel syndrome with constipation  Alternates between diarrhea and constipation. Diarrhea more often then constipation. Baclofen helps  5. Thyroid disease  No problems that she is aware of  6. Recurrent major depressive disorder, in full  remission (HCC)  Currently not on antidepressant.says she is doing well Depression screen [facility] 2/9 02/09/2018 11/20/2017 11/06/2017  Decreased Interest 1 1 1   Down, Depressed, Hopeless 0 1 1  PHQ - 2 Score 1 2 2   Altered sleeping - - 3  Tired, decreased energy - - 2  Change in appetite - - 2  Feeling bad or failure about yourself  - - 0  Trouble concentrating - - 1  Moving slowly or fidgety/restless - - 0  Suicidal thoughts - - 0  PHQ-9 Score - - 10     7. Mixed hyperlipidemia  Not really watching diet  8. Inflammatory autoimmune disorder (HCC)  They are still not sure exactly what she has  9. Systemic lupus erythematosus, unspecified SLE type, unspecified organ involvement status (HCC) still not sure that this is the problem. Test are not conclusive   10. Seizures (HCC)  No recent seizure activity  11. Vitamin D deficiency  Takes vitamin d daily in supplements  12. Muscle spasticity  cauees trouble with walking at times    New complaints: None today  Social history: Lives with husband and foster son- they are trying to adopt him. They have had him since he was and infant and he is now 65 years old.    Review of Systems  Constitutional: Negative for activity change and appetite change.  HENT: Negative.   Eyes: Negative for pain.  Respiratory: Negative for shortness of breath.   Cardiovascular: Negative for chest pain, palpitations and leg swelling.  Gastrointestinal: Negative for abdominal pain.  Endocrine: Negative for polydipsia.  Genitourinary: Negative.   Musculoskeletal: Positive for back pain, myalgias, neck pain and neck stiffness.  Skin: Negative for rash.  Neurological: Positive for dizziness (occasional) and seizures. Negative for weakness and headaches.  Hematological: Does not bruise/bleed easily.  Psychiatric/Behavioral: Negative.  Negative for sleep disturbance and suicidal ideas. The patient is not nervous/anxious.   All other systems reviewed and are  negative.      Objective:   Physical Exam  Constitutional: She is oriented to person, place, and time. She appears well-developed and well-nourished.  HENT:  Nose: Nose normal.  Mouth/Throat: Oropharynx is clear and moist.  Eyes: EOM are normal.  Neck: Trachea normal, normal range of motion and full passive range of motion without pain. Neck supple. No JVD present. Carotid bruit is not present. No thyromegaly present.  Cardiovascular: Normal rate, regular rhythm, normal heart sounds and intact distal pulses. Exam reveals no gallop and no friction rub.  No murmur heard. Pulmonary/Chest: Effort normal and breath sounds normal.  Abdominal: Soft. Bowel sounds are normal. She exhibits no distension and no mass. There is no tenderness.  Musculoskeletal: Normal range of motion.  Lymphadenopathy:    She has no cervical adenopathy.  Neurological: She is alert and oriented to person, place, and time. She has normal reflexes.  Skin: Skin is warm and dry.  Psychiatric: She has a normal  mood and affect. Her behavior is normal. Judgment and thought content normal.   BP 118/73   Pulse 85   Temp 98.8 F (37.1 C) (Oral)   Ht 5\' 8"  (1.727 m)   Wt 226 lb 6 oz (102.7 kg)   BMI 34.42 kg/m      Assessment & Plan:  1. Tachycardia, paroxysmal (HCC) Avoid caffeine - metoprolol succinate (TOPROL-XL) 25 MG 24 hr tablet; TAKE ONE (1) TABLET EACH DAY  Dispense: 90 tablet; Refill: 1  2. Gastroesophageal reflux disease without esophagitis Avoid spicy foods Do not eat 2 hours prior to bedtime - dexlansoprazole (DEXILANT) 60 MG capsule; TAKE ONE (1) CAPSULE EACH DAY  Dispense: 90 capsule; Refill: 1  3. Dysphagia, unspecified type Chew food real good  4. Irritable bowel syndrome with constipation - baclofen (LIORESAL) 20 MG tablet; TAKE ONE TABLET FOUR TIMES DAILY  Dispense: 360 each; Refill: 1  5. Thyroid disease Checking b12 for endocrinology - Vitamin B12 - Thyroid Panel With TSH  6. Recurrent  major depressive disorder, in full remission [facility]) Stress management  7. Mixed hyperlipidemia Low fat diet - CMP14+EGFR - Lipid panel - atorvastatin (LIPITOR) 40 MG tablet; TAKE ONE (1) TABLET EACH DAY  Dispense: 90 tablet; Refill: 1  8. Inflammatory autoimmune disorder (HCC) Keep appointment with specialist  9. Systemic lupus erythematosus, unspecified SLE type, unspecified organ involvement status (HCC) Again contiue with specialist  10. Seizures (HCC)  11. Vitamin D deficiency Continue vitamin d  12. Muscle spasticity Fall prevention  13. Peripheral edema Elevate legs when sitting - furosemide (LASIX) 20 MG tablet; Take 1 tablet (20 mg total) by mouth daily.  Dispense: 90 tablet; Refill: 1  14. Systemic lupus erythematosus with other organ involvement, unspecified SLE type (HCC) - gabapentin (NEURONTIN) 600 MG tablet; TAKE ONE TABLET FIVE TIMES DAILY  Dispense: 450 tablet; Refill: 2 - predniSONE (DELTASONE) 5 MG tablet; Take 1 tablet (5 mg total) by mouth every morning.  Dispense: 90 tablet; Refill: 1   Depo shot Labs pending Health maintenance reviewed Diet and exercise encouraged Continue all meds Follow up  In 3 months   Mary-Margaret Daphine Deutscher, FNP

## 2018-02-09 NOTE — Patient Instructions (Signed)

## 2018-02-10 ENCOUNTER — Other Ambulatory Visit: Payer: Self-pay | Admitting: Nurse Practitioner

## 2018-02-10 DIAGNOSIS — D519 Vitamin B12 deficiency anemia, unspecified: Secondary | ICD-10-CM

## 2018-02-10 LAB — CMP14+EGFR
ALBUMIN: 4.4 g/dL (ref 3.5–5.5)
ALK PHOS: 114 IU/L (ref 39–117)
ALT: 20 IU/L (ref 0–32)
AST: 15 IU/L (ref 0–40)
Albumin/Globulin Ratio: 1.8 (ref 1.2–2.2)
BUN / CREAT RATIO: 9 (ref 9–23)
BUN: 7 mg/dL (ref 6–20)
Bilirubin Total: 0.3 mg/dL (ref 0.0–1.2)
CALCIUM: 9.3 mg/dL (ref 8.7–10.2)
CO2: 21 mmol/L (ref 20–29)
CREATININE: 0.76 mg/dL (ref 0.57–1.00)
Chloride: 112 mmol/L — ABNORMAL HIGH (ref 96–106)
GFR calc Af Amer: 118 mL/min/{1.73_m2} (ref >59)
GFR, EST NON AFRICAN AMERICAN: 103 mL/min/{1.73_m2} (ref >59)
GLUCOSE: 113 mg/dL — AB (ref 65–99)
Globulin, Total: 2.4 g/dL (ref 1.5–4.5)
Potassium: 3.7 mmol/L (ref 3.5–5.2)
Sodium: 146 mmol/L — ABNORMAL HIGH (ref 134–144)
Total Protein: 6.8 g/dL (ref 6.0–8.5)

## 2018-02-10 LAB — LIPID PANEL
CHOL/HDL RATIO: 4.2 ratio (ref 0.0–4.4)
CHOLESTEROL TOTAL: 219 mg/dL — AB (ref 100–199)
HDL: 52 mg/dL (ref >39)
LDL CALC: 114 mg/dL — AB (ref 0–99)
TRIGLYCERIDES: 263 mg/dL — AB (ref 0–149)
VLDL CHOLESTEROL CAL: 53 mg/dL — AB (ref 5–40)

## 2018-02-10 LAB — VITAMIN B12: Vitamin B-12: 176 pg/mL — ABNORMAL LOW (ref 232–1245)

## 2018-02-10 LAB — THYROID PANEL WITH TSH
Free Thyroxine Index: 1.6 (ref 1.2–4.9)
T3 Uptake Ratio: 26 % (ref 24–39)
T4, Total: 6.1 ug/dL (ref 4.5–12.0)
TSH: 1.02 u[IU]/mL (ref 0.450–4.500)

## 2018-02-10 MED ORDER — CYANOCOBALAMIN 1000 MCG/ML IJ SOLN: INTRAMUSCULAR | 2 refills | Status: DC

## 2018-02-12 ENCOUNTER — Other Ambulatory Visit: Payer: Self-pay

## 2018-02-12 ENCOUNTER — Encounter: Payer: Self-pay | Admitting: Registered Nurse

## 2018-02-12 ENCOUNTER — Encounter: Payer: BLUE CROSS/BLUE SHIELD | Attending: Physical Medicine & Rehabilitation | Admitting: Registered Nurse

## 2018-02-12 VITALS — BP 119/80 | HR 84 | Ht 67.0 in | Wt 228.0 lb

## 2018-02-12 DIAGNOSIS — G8929 Other chronic pain: Secondary | ICD-10-CM | POA: Diagnosis not present

## 2018-02-12 DIAGNOSIS — M546 Pain in thoracic spine: Secondary | ICD-10-CM | POA: Diagnosis not present

## 2018-02-12 DIAGNOSIS — M6249 Contracture of muscle, multiple sites: Secondary | ICD-10-CM | POA: Insufficient documentation

## 2018-02-12 DIAGNOSIS — M797 Fibromyalgia: Secondary | ICD-10-CM | POA: Diagnosis not present

## 2018-02-12 DIAGNOSIS — Z79899 Other long term (current) drug therapy: Secondary | ICD-10-CM

## 2018-02-12 DIAGNOSIS — G894 Chronic pain syndrome: Secondary | ICD-10-CM | POA: Diagnosis not present

## 2018-02-12 DIAGNOSIS — M545 Low back pain: Secondary | ICD-10-CM | POA: Diagnosis not present

## 2018-02-12 DIAGNOSIS — R569 Unspecified convulsions: Secondary | ICD-10-CM

## 2018-02-12 DIAGNOSIS — D8989 Other specified disorders involving the immune mechanism, not elsewhere classified: Secondary | ICD-10-CM | POA: Diagnosis not present

## 2018-02-12 DIAGNOSIS — M329 Systemic lupus erythematosus, unspecified: Secondary | ICD-10-CM | POA: Insufficient documentation

## 2018-02-12 DIAGNOSIS — M62838 Other muscle spasm: Secondary | ICD-10-CM

## 2018-02-12 DIAGNOSIS — Z5181 Encounter for therapeutic drug level monitoring: Secondary | ICD-10-CM | POA: Diagnosis not present

## 2018-02-12 DIAGNOSIS — G609 Hereditary and idiopathic neuropathy, unspecified: Secondary | ICD-10-CM

## 2018-02-12 MED ORDER — OXYCODONE-ACETAMINOPHEN 7.5-325 MG PO TABS: 1.0000 | ORAL_TABLET | Freq: Every day | ORAL | 0 refills | Status: DC | PRN

## 2018-02-12 NOTE — Progress Notes (Signed)
Subjective:    Patient ID: Autumn Davis, female    DOB: 08/09/1986, 32 y.o.   MRN: [id number]  HPI: [name] is a 32year old female who returns for follow up appointment for chronic pain and medication refill. She states her pain is located in her ribs ( mid-flank region). She rates her pain  5. Her current exercise regime is walking.   [name] Morphine equivalent is 56.25 MME.    Last UDS was performed on 12/29/2017, it was consistent.  Pain Inventory Average Pain 8 Pain Right Now 5 My pain is constant, sharp, burning, dull, stabbing, tingling and aching  In the last 24 hours, has pain interfered with the following? General activity 9 Relation with others 5 Enjoyment of life 5 What TIME of day is your pain at its worst? evening night Sleep (in general) Poor  Pain is worse with: walking, bending, sitting, inactivity, standing and some activites Pain improves with: rest, heat/ice, therapy/exercise, pacing activities and medication Relief from Meds: 5  Mobility walk without assistance walk with assistance use a cane use a walker ability to climb steps?  yes do you drive?  no use a wheelchair transfers alone Do you have any goals in this area?  yes  Function disabled: date disabled n/a I need assistance with the following:  meal prep, household duties and shopping Do you have any goals in this area?  yes  Neuro/Psych bladder control problems bowel control problems weakness numbness tremor tingling  Prior Studies Any changes since last visit?  no bone scan  Physicians involved in your care No changes since last visit  Family History  Problem Relation Age of Onset  . Thyroid disease Mother   . Colon polyps Father   . Lung cancer Maternal Grandmother   . Cancer Paternal Grandmother        colon/pancreatiec/lymphoma  . Irritable bowel syndrome Brother    Social History   Socioeconomic History  . Marital status: Married   Spouse name: Not on file  . Number of children: Not on file  . Years of education: Not on file  . Highest education level: Not on file  Occupational History  . Occupation: disabled    Employer: [employer]  Social Needs  . Financial resource strain: Not on file  . Food insecurity:    Worry: Not on file    Inability: Not on file  . Transportation needs:    Medical: Not on file    Non-medical: Not on file  Tobacco Use  . Smoking status: Never Smoker  . Smokeless tobacco: Never Used  Substance and Sexual Activity  . Alcohol use: No    Alcohol/week: 0.0 oz  . Drug use: No  . Sexual activity: Not on file  Lifestyle  . Physical activity:    Days per week: Not on file    Minutes per session: Not on file  . Stress: Not on file  Relationships  . Social connections:    Talks on phone: Not on file    Gets together: Not on file    Attends religious service: Not on file    Active member of club or organization: Not on file    Attends meetings of clubs or organizations: Not on file    Relationship status: Not on file  Other Topics Concern  . Not on file  Social History Narrative  . Not on file   Past Surgical History:  Procedure Laterality Date  .  24 HOUR PH STUDY N/A 10/29/2015   Procedure: 24 HOUR PH STUDY;  Surgeon: Iva Boop, MD;  Location: [facility];  Service: Endoscopy;  Laterality: N/A;  . COLONOSCOPY    . ESOPHAGEAL MANOMETRY N/A 10/29/2015   Procedure: ESOPHAGEAL MANOMETRY (EM);  Surgeon: Iva Boop, MD;  Location: [facility];  Service: Endoscopy;  Laterality: N/A;  . KNEE SURGERY  2002/2003   bil  . RECTAL SURGERY  correction of prolapse   2010   Past Medical History:  Diagnosis Date  . Allergy   . Arthritis    HANDS,HIPS,KNEES  . Bronchitis, chronic/intermittent 01/22/2012  . Depression 01/22/2012  . GERD (gastroesophageal reflux disease)   . Hyperlipidemia   . IBS (irritable bowel syndrome) 01/22/2012  . Lupus   . Neuromuscular  disorder (HCC)   . Osteoporosis   . Seizures (HCC)   . SOB (shortness of breath)   . Thyroid disease    There were no vitals taken for this visit.  Opioid Risk Score:   Fall Risk Score:  `1  Depression screen PHQ 2/9  Depression screen [facility] 2/9 02/12/2018 02/09/2018 11/20/2017 11/06/2017 05/14/2017 02/06/2017 10/23/2016  Decreased Interest 0 1 1 1  0 0 1  Down, Depressed, Hopeless 0 0 1 1 0 0 0  PHQ - 2 Score 0 1 2 2  0 0 1  Altered sleeping 0 - - 3 - - -  Tired, decreased energy 0 - - 2 - - -  Change in appetite 0 - - 2 - - -  Feeling bad or failure about yourself  0 - - 0 - - -  Trouble concentrating 0 - - 1 - - -  Moving slowly or fidgety/restless 0 - - 0 - - -  Suicidal thoughts 0 - - 0 - - -  PHQ-9 Score 0 - - 10 - - -      Review of Systems  Constitutional: Negative.   HENT: Negative.   Eyes: Negative.   Respiratory: Negative.   Cardiovascular: Negative.   Gastrointestinal: Negative.        Bowel control problem  Endocrine: Negative.   Genitourinary: Negative.        Bladder control problems  Musculoskeletal: Positive for gait problem.  Skin: Negative.   Allergic/Immunologic: Negative.   Neurological: Positive for dizziness, tremors, weakness and numbness.       Tingling  spasms  Psychiatric/Behavioral: Negative.   All other systems reviewed and are negative.      Objective:   Physical Exam  Constitutional: She is oriented to person, place, and time. She appears well-developed and well-nourished.  HENT:  Head: Normocephalic and atraumatic.  Neck: Normal range of motion. Neck supple.  Cardiovascular: Normal rate and regular rhythm.  Pulmonary/Chest: Effort normal and breath sounds normal.  Musculoskeletal:  Normal Muscle Bulk and Muscle Testing Reveals: Upper Extremities: Decreased ROM 90 Degrees and Muscle Strength 5/5 Thoracic Paraspinal Tenderness: T-2-T-3  T-7-T-9 Lower Extremities: Full  ROM and Muscle Strength 5/5 Arises from Table with ease Narrow  Based Gait  Neurological: She is alert and oriented to person, place, and time.  Skin: Skin is warm and dry.  Psychiatric: She has a normal mood and affect.  Nursing note and vitals reviewed.         Assessment & Plan:  1. Chronic seizure disorder: No seizure's. Continue Gabapentin. Neurology Following. 02/12/2018 2. Chronic muscle spasms, weakness with associated pain disorder: Continue Baclofen and MOBIC. Continue with Exercise regime. 02/12/2018 3. Chronic dysphagia: GI  Following. 02/12/2018 4. Anxiety with depression : Stable. Continue Elavil. 02/12/2018 5. Fibromyalgia/ Rib Pain/Chronic Pain: Continue Diclofenac: Continue with exercise and heat Therapy. 02/12/2018 Refilled: oxyCODONE 7.5/325mg  onetablet 5 times daily as needed #150. We will continue opioid monitoring program, this consists of regular clinic visits, examinations, urine drug screen, pill counts as well as use of [place] Controlled Substance Reporting System. 6. Peripheral Neuropathy: Continue  Gabapentin: 02/12/2018  20 minutes of face to face patient care time was spent during this visit. All questions were encouraged and answered.  F/U in 1 month

## 2018-02-17 ENCOUNTER — Other Ambulatory Visit: Payer: Self-pay | Admitting: Nurse Practitioner

## 2018-03-12 ENCOUNTER — Encounter: Payer: BLUE CROSS/BLUE SHIELD | Attending: Physical Medicine & Rehabilitation | Admitting: Registered Nurse

## 2018-03-12 ENCOUNTER — Encounter: Payer: Self-pay | Admitting: Registered Nurse

## 2018-03-12 ENCOUNTER — Other Ambulatory Visit: Payer: Self-pay | Admitting: Nurse Practitioner

## 2018-03-12 VITALS — BP 124/81 | HR 84 | Ht 67.5 in | Wt 224.0 lb

## 2018-03-12 DIAGNOSIS — M62838 Other muscle spasm: Secondary | ICD-10-CM

## 2018-03-12 DIAGNOSIS — G609 Hereditary and idiopathic neuropathy, unspecified: Secondary | ICD-10-CM

## 2018-03-12 DIAGNOSIS — M546 Pain in thoracic spine: Secondary | ICD-10-CM | POA: Diagnosis not present

## 2018-03-12 DIAGNOSIS — G8929 Other chronic pain: Secondary | ICD-10-CM | POA: Diagnosis not present

## 2018-03-12 DIAGNOSIS — M329 Systemic lupus erythematosus, unspecified: Secondary | ICD-10-CM | POA: Insufficient documentation

## 2018-03-12 DIAGNOSIS — R569 Unspecified convulsions: Secondary | ICD-10-CM | POA: Diagnosis not present

## 2018-03-12 DIAGNOSIS — Z79899 Other long term (current) drug therapy: Secondary | ICD-10-CM

## 2018-03-12 DIAGNOSIS — M797 Fibromyalgia: Secondary | ICD-10-CM | POA: Diagnosis not present

## 2018-03-12 DIAGNOSIS — G894 Chronic pain syndrome: Secondary | ICD-10-CM

## 2018-03-12 DIAGNOSIS — M545 Low back pain: Secondary | ICD-10-CM | POA: Diagnosis present

## 2018-03-12 DIAGNOSIS — D8989 Other specified disorders involving the immune mechanism, not elsewhere classified: Secondary | ICD-10-CM

## 2018-03-12 DIAGNOSIS — M6249 Contracture of muscle, multiple sites: Secondary | ICD-10-CM | POA: Insufficient documentation

## 2018-03-12 DIAGNOSIS — Z5181 Encounter for therapeutic drug level monitoring: Secondary | ICD-10-CM | POA: Diagnosis not present

## 2018-03-12 MED ORDER — OXYCODONE-ACETAMINOPHEN 7.5-325 MG PO TABS: 1.0000 | ORAL_TABLET | Freq: Every day | ORAL | 0 refills | Status: DC | PRN

## 2018-03-12 NOTE — Progress Notes (Signed)
Subjective:    Patient ID: Autumn Davis, female    DOB: 1986/04/13, 32 y.o.   MRN: [id number]  HPI:  Autumn Davis is a 32 year old  female who is here for follow up appointment for chronic pain and medication refill. She states her pain is located in her mid back ( bilateral ribs). She rates her pain 6.  Her current exercise regime is walking and performing stretching exercises.   Autumn Davis reports last week she was walking into the bathroom around 3-4: am and trip over a stool and landed on the commode. Ecchymosis noted on right buttock it is resolving, she was able to pick herself up. She didn't seek medical attention. Reviewed falls prevention, she verbalizes understanding.   Autumn Davis Morphine Equivalent is 56.25 MME.  Last UDS was Performed on 12/29/2017, it was consistent.   Pain Inventory Average Pain 8 Pain Right Now 6 My pain is constant, sharp, burning, stabbing and aching  In the last 24 hours, has pain interfered with the following? General activity 8 Relation with others 4 Enjoyment of life 4 What TIME of day is your pain at its worst? night Sleep (in general) Fair  Pain is worse with: walking, bending, sitting, inactivity, standing and some activites Pain improves with: rest, heat/ice, therapy/exercise, pacing activities, medication and TENS Relief from Meds: 4  Mobility walk without assistance walk with assistance use a cane use a walker ability to climb steps?  yes do you drive?  no  Function I need assistance with the following:  meal prep, household duties and shopping  Neuro/Psych bladder control problems bowel control problems weakness numbness tremor tingling trouble walking spasms dizziness  Prior Studies Any changes since last visit?  no  Physicians involved in your care Any changes since last visit?  no   Family History  Problem Relation Age of Onset  . Thyroid disease Mother   . Colon polyps Father   . Lung  cancer Maternal Grandmother   . Cancer Paternal Grandmother        colon/pancreatiec/lymphoma  . Irritable bowel syndrome Brother    Social History   Socioeconomic History  . Marital status: Married    Spouse name: Not on file  . Number of children: Not on file  . Years of education: Not on file  . Highest education level: Not on file  Occupational History  . Occupation: disabled    Employer: [employer]  Social Needs  . Financial resource strain: Not on file  . Food insecurity:    Worry: Not on file    Inability: Not on file  . Transportation needs:    Medical: Not on file    Non-medical: Not on file  Tobacco Use  . Smoking status: Never Smoker  . Smokeless tobacco: Never Used  Substance and Sexual Activity  . Alcohol use: No    Alcohol/week: 0.0 oz  . Drug use: No  . Sexual activity: Not on file  Lifestyle  . Physical activity:    Days per week: Not on file    Minutes per session: Not on file  . Stress: Not on file  Relationships  . Social connections:    Talks on phone: Not on file    Gets together: Not on file    Attends religious service: Not on file    Active member of club or organization: Not on file    Attends meetings of clubs or organizations: Not on file  Relationship status: Not on file  Other Topics Concern  . Not on file  Social History Narrative  . Not on file   Past Surgical History:  Procedure Laterality Date  . 24 HOUR PH STUDY N/A 10/29/2015   Procedure: 24 HOUR PH STUDY;  Surgeon: Iva Boop, MD;  Location: [facility];  Service: Endoscopy;  Laterality: N/A;  . COLONOSCOPY    . ESOPHAGEAL MANOMETRY N/A 10/29/2015   Procedure: ESOPHAGEAL MANOMETRY (EM);  Surgeon: Iva Boop, MD;  Location: [facility];  Service: Endoscopy;  Laterality: N/A;  . KNEE SURGERY  2002/2003   bil  . RECTAL SURGERY  correction of prolapse   2010   Past Medical History:  Diagnosis Date  . Allergy   . Arthritis    HANDS,HIPS,KNEES    . Bronchitis, chronic/intermittent 01/22/2012  . Depression 01/22/2012  . GERD (gastroesophageal reflux disease)   . Hyperlipidemia   . IBS (irritable bowel syndrome) 01/22/2012  . Lupus (HCC)   . Neuromuscular disorder (HCC)   . Osteoporosis   . Seizures (HCC)   . SOB (shortness of breath)   . Thyroid disease    BP 124/81   Pulse 84   Ht 5' 7.5" (1.715 m)   Wt 224 lb (101.6 kg)   SpO2 98%   BMI 34.57 kg/m   Opioid Risk Score:   Fall Risk Score:  `1  Depression screen PHQ 2/9  Depression screen [facility] 2/9 02/12/2018 02/09/2018 11/20/2017 11/06/2017 05/14/2017 02/06/2017 10/23/2016  Decreased Interest 0 1 1 1  0 0 1  Down, Depressed, Hopeless 0 0 1 1 0 0 0  PHQ - 2 Score 0 1 2 2  0 0 1  Altered sleeping 0 - - 3 - - -  Tired, decreased energy 0 - - 2 - - -  Change in appetite 0 - - 2 - - -  Feeling bad or failure about yourself  0 - - 0 - - -  Trouble concentrating 0 - - 1 - - -  Moving slowly or fidgety/restless 0 - - 0 - - -  Suicidal thoughts 0 - - 0 - - -  PHQ-9 Score 0 - - 10 - - -     Review of Systems  Constitutional: Negative.   HENT: Negative.   Eyes: Negative.   Respiratory: Negative.   Cardiovascular: Negative.   Gastrointestinal: Negative.   Endocrine: Negative.   Genitourinary: Negative.   Musculoskeletal: Positive for arthralgias, back pain and myalgias.  Skin: Negative.   Allergic/Immunologic: Negative.   Neurological: Negative.   Hematological: Negative.   Psychiatric/Behavioral: Negative.   All other systems reviewed and are negative.      Objective:   Physical Exam  Constitutional: She is oriented to person, place, and time. She appears well-developed and well-nourished.  HENT:  Head: Normocephalic and atraumatic.  Neck: Normal range of motion. Neck supple.  Cardiovascular: Normal rate and regular rhythm.  Pulmonary/Chest: Effort normal and breath sounds normal.  Musculoskeletal:  Normal Muscle Bulk and Muscle Testing Reveals: Upper Extremities:  Full ROM and Muscle Strength 5/5 Thoracic Paraspinal Tenderness: T-7-T-9 Lower Extremities: Full ROM and Muscle Strength 5/5 Arises from Table Slowly Narrow Based Gait  Neurological: She is alert and oriented to person, place, and time.  Skin: Skin is warm and dry.  Psychiatric: She has a normal mood and affect.  Nursing note and vitals reviewed.         Assessment & Plan:  1. Chronic seizure disorder: No seizure's. Continue  current medication regimen with  Gabapentin. Neurology Following.03/12/2018 2. Chronic muscle spasms, weakness with associated pain disorder: Continue current medication regimen with Baclofen. Continue with Exercise regime.03/12/2018 3. Chronic dysphagia: GI Following.03/12/2018 4. Anxiety with depression : Stable. Continue current medication regimen with  Elavil.03/12/2018 5. Fibromyalgia/ Rib Pain/Chronic Pain:Continue Diclofenac: Continue with exercise and heat Therapy. 03/12/2018 Refilled: oxyCODONE 7.5/325mg  onetablet 5 times daily as needed #150. We will continue opioid monitoring program, this consists of regular clinic visits, examinations, urine drug screen, pill counts as well as use of [place] Controlled Substance Reporting System. 6. Peripheral Neuropathy:Continue current medication regimen with Gabapentin: 03/12/2018  20 minutes of face to face patient care time was spent during this visit. All questions were encouraged and answered.  F/U in 1 month

## 2018-04-14 ENCOUNTER — Encounter: Payer: Self-pay | Admitting: Physical Medicine & Rehabilitation

## 2018-04-14 ENCOUNTER — Encounter
Payer: BLUE CROSS/BLUE SHIELD | Attending: Physical Medicine & Rehabilitation | Admitting: Physical Medicine & Rehabilitation

## 2018-04-14 VITALS — BP 116/81 | HR 80 | Ht 67.5 in | Wt 217.6 lb

## 2018-04-14 DIAGNOSIS — G894 Chronic pain syndrome: Secondary | ICD-10-CM | POA: Diagnosis not present

## 2018-04-14 DIAGNOSIS — M545 Low back pain: Secondary | ICD-10-CM | POA: Insufficient documentation

## 2018-04-14 DIAGNOSIS — R569 Unspecified convulsions: Secondary | ICD-10-CM | POA: Diagnosis not present

## 2018-04-14 DIAGNOSIS — G609 Hereditary and idiopathic neuropathy, unspecified: Secondary | ICD-10-CM

## 2018-04-14 DIAGNOSIS — M6249 Contracture of muscle, multiple sites: Secondary | ICD-10-CM | POA: Insufficient documentation

## 2018-04-14 DIAGNOSIS — M797 Fibromyalgia: Secondary | ICD-10-CM | POA: Insufficient documentation

## 2018-04-14 DIAGNOSIS — M62838 Other muscle spasm: Secondary | ICD-10-CM

## 2018-04-14 DIAGNOSIS — M329 Systemic lupus erythematosus, unspecified: Secondary | ICD-10-CM | POA: Diagnosis not present

## 2018-04-14 MED ORDER — OXYCODONE-ACETAMINOPHEN 7.5-325 MG PO TABS: 1.0000 | ORAL_TABLET | Freq: Every day | ORAL | 0 refills | Status: DC | PRN

## 2018-04-14 NOTE — Progress Notes (Signed)
Subjective:    Patient ID: Autumn Davis, female    DOB: [birth year], 32 y.o.   MRN: [id number]  HPI   Autumn Davis is here in follow up of her chronic pain. She has been doing fairly well since I last saw her. She was found to have low B12 and vitamin D levels since I last saw her. She feels that the supplementation helped her energy levels.  She has noticed occasional tingling and heaviness in her legs.  Has been intermittent and may be only lasting a day or so.  Sometimes pain is associated with it, sometimes not.  She does state that she has been trying to be more active lately.  She likes to go out and walks with her child.  She is doing some stretches and some other things around the house as well.  She is been quite careful to avoid the heat outside.  She remains on oxycodone for pain control, 7.5/320 515 times a day as needed.  She is also taking gabapentin 600 mg 5 times daily.  Her prednisone dose is at 5 mg daily per rheumatology  Pain Inventory Average Pain 8 Pain Right Now 5 My pain is constant, sharp, burning, stabbing and tingling  In the last 24 hours, has pain interfered with the following? General activity 6 Relation with others 6 Enjoyment of life 6 What TIME of day is your pain at its worst? evening Sleep (in general) Poor  Pain is worse with: walking, bending, sitting, inactivity, standing and some activites Pain improves with: rest, heat/ice, therapy/exercise, pacing activities, medication and TENS Relief from Meds: 6  Mobility walk without assistance walk with assistance use a cane use a walker ability to climb steps?  yes do you drive?  no  Function I need assistance with the following:  meal prep, household duties and shopping  Neuro/Psych bladder control problems bowel control problems weakness numbness tremor tingling trouble walking spasms dizziness  Prior Studies Any changes since last visit?  no  Physicians involved in your care Any  changes since last visit?  no   Family History  Problem Relation Age of Onset  . Thyroid disease Mother   . Colon polyps Father   . Lung cancer Maternal Grandmother   . Cancer Paternal Grandmother        colon/pancreatiec/lymphoma  . Irritable bowel syndrome Brother    Social History   Socioeconomic History  . Marital status: Married    Spouse name: Not on file  . Number of children: Not on file  . Years of education: Not on file  . Highest education level: Not on file  Occupational History  . Occupation: disabled    Employer: [employer]  Social Needs  . Financial resource strain: Not on file  . Food insecurity:    Worry: Not on file    Inability: Not on file  . Transportation needs:    Medical: Not on file    Non-medical: Not on file  Tobacco Use  . Smoking status: Never Smoker  . Smokeless tobacco: Never Used  Substance and Sexual Activity  . Alcohol use: No    Alcohol/week: 0.0 oz  . Drug use: No  . Sexual activity: Not on file  Lifestyle  . Physical activity:    Days per week: Not on file    Minutes per session: Not on file  . Stress: Not on file  Relationships  . Social connections:    Talks on phone: Not on  file    Gets together: Not on file    Attends religious service: Not on file    Active member of club or organization: Not on file    Attends meetings of clubs or organizations: Not on file    Relationship status: Not on file  Other Topics Concern  . Not on file  Social History Narrative  . Not on file   Past Surgical History:  Procedure Laterality Date  . 24 HOUR PH STUDY N/A 10/29/2015   Procedure: 24 HOUR PH STUDY;  Surgeon: Iva Boop, MD;  Location: [facility];  Service: Endoscopy;  Laterality: N/A;  . COLONOSCOPY    . ESOPHAGEAL MANOMETRY N/A 10/29/2015   Procedure: ESOPHAGEAL MANOMETRY (EM);  Surgeon: Iva Boop, MD;  Location: [facility];  Service: Endoscopy;  Laterality: N/A;  . KNEE SURGERY  2002/2003   bil   . RECTAL SURGERY  correction of prolapse   2010   Past Medical History:  Diagnosis Date  . Allergy   . Arthritis    HANDS,HIPS,KNEES  . Bronchitis, chronic/intermittent 01/22/2012  . Depression 01/22/2012  . GERD (gastroesophageal reflux disease)   . Hyperlipidemia   . IBS (irritable bowel syndrome) 01/22/2012  . Lupus (HCC)   . Neuromuscular disorder (HCC)   . Osteoporosis   . Seizures (HCC)   . SOB (shortness of breath)   . Thyroid disease    BP 116/81   Pulse 80   Ht 5' 7.5" (1.715 m)   Wt 217 lb 9.6 oz (98.7 kg)   SpO2 97%   BMI 33.58 kg/m   Opioid Risk Score:   Fall Risk Score:  `1  Depression screen PHQ 2/9  Depression screen [facility] 2/9 02/12/2018 02/09/2018 11/20/2017 11/06/2017 05/14/2017 02/06/2017 10/23/2016  Decreased Interest 0 1 1 1  0 0 1  Down, Depressed, Hopeless 0 0 1 1 0 0 0  PHQ - 2 Score 0 1 2 2  0 0 1  Altered sleeping 0 - - 3 - - -  Tired, decreased energy 0 - - 2 - - -  Change in appetite 0 - - 2 - - -  Feeling bad or failure about yourself  0 - - 0 - - -  Trouble concentrating 0 - - 1 - - -  Moving slowly or fidgety/restless 0 - - 0 - - -  Suicidal thoughts 0 - - 0 - - -  PHQ-9 Score 0 - - 10 - - -     Review of Systems  Constitutional: Negative.   HENT: Negative.   Eyes: Negative.   Respiratory: Negative.   Cardiovascular: Negative.   Gastrointestinal: Negative.   Endocrine: Negative.   Genitourinary: Negative.   Musculoskeletal: Positive for arthralgias, back pain, gait problem and myalgias.  Skin: Negative.   Allergic/Immunologic: Negative.   Neurological: Positive for dizziness, tremors, weakness and numbness.  Hematological: Negative.   Psychiatric/Behavioral: Negative.   All other systems reviewed and are negative.      Objective:   Physical Exam General: No acute distress HEENT: EOMI, oral membranes moist Cards: reg rate  Chest: normal effort Abdomen: Soft, NT, ND Skin: dry, intact Extremities: no edema Skin:skin warm and  dry.  Neuro: motor 5/5. Sensory intact except for sl decrease to FT distally.  balance improved, slightly wide based.  Musculoskeletal:general trunk pain.  Numerous tender points Psych:appears alert. Pleasant and appropriate.   Assessment & Plan:  1. Chronic seizure disorder---? myoclonic jerks/myotonia 2. 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  3. Chronic dysphagia  4. Right low back/flank pain  5. Fibromyalgia syndrome  6. Anxiety with depression  7. Pulmonary function abnormalities., ?CO2 retention? ---recent normal ECHO from 09/15/14 8. Osteoporosis:    Plan:  1. Continue Elavil at 75mg  qhs  2. Maintain baclofen to 20mg  for muscle spasms. Maintain HEP for ROM  3. Continue prednisone per rheum 5mg  daily 4.  diclofenac 75mg  BID for joint pain 5. Oxycodone 7.5mg  fr breakthrough pain #150. We will continue the controlled substance monitoring program, this consists of regular clinic visits, examinations, routine drug screening, pill counts as well as use of [place] Controlled Substance Reporting System. [place] today.   6. Continue Gabapentin 600mg  5x daily for seizures, spasms, neuro pain.   This continues to provide some relief  7. Continue lidoderm patches for trunk/back pain.  8.calcium, vit D, B12, forteo per endocrine and primary.   -Reiterated the importance of electrolytes and minerals to promote muscle, joint, nerve health   Follow up with NP  in about one month. 15 minutes of face to face patient care time were spent during this visit. All questions were encouraged and answered.

## 2018-04-14 NOTE — Patient Instructions (Signed)
KEEP UP YOUR EXERCISE AND ACTIVITY AS YOU ARE  MAKE SURE YOU'RE GETTING ENOUGH MINERALS AND ELECTROLYTES IN YOUR DIET

## 2018-04-22 ENCOUNTER — Ambulatory Visit (INDEPENDENT_AMBULATORY_CARE_PROVIDER_SITE_OTHER): Payer: BLUE CROSS/BLUE SHIELD | Admitting: Family

## 2018-04-22 ENCOUNTER — Encounter: Payer: Self-pay | Admitting: Family

## 2018-04-22 ENCOUNTER — Telehealth: Payer: Self-pay | Admitting: Nurse Practitioner

## 2018-04-22 VITALS — BP 119/79 | HR 97 | Temp 98.8°F | Ht 67.0 in | Wt 214.6 lb

## 2018-04-22 DIAGNOSIS — L03011 Cellulitis of right finger: Secondary | ICD-10-CM

## 2018-04-22 MED ORDER — CEPHALEXIN 500 MG PO CAPS: 500.0000 mg | ORAL_CAPSULE | Freq: Two times a day (BID) | ORAL | 0 refills | Status: DC

## 2018-04-22 MED ORDER — MUPIROCIN 2 % EX OINT: 1.0000 "application " | TOPICAL_OINTMENT | Freq: Two times a day (BID) | CUTANEOUS | 0 refills | Status: DC

## 2018-04-22 NOTE — Progress Notes (Signed)
Subjective:    Patient ID: Autumn Davis, female    DOB: [birth year], 32 y.o.   MRN: [id number]  Chief Complaint  Patient presents with  . infected nail    right nail index finger    Hand Pain   The incident occurred 5 to 7 days ago. There was no injury mechanism. Pain location: right index finger. The quality of the pain is described as aching. The pain does not radiate. The pain is mild. The pain has been intermittent since the incident. Pertinent negatives include no numbness or tingling. The symptoms are aggravated by palpation. She has tried NSAIDs for the symptoms. The treatment provided mild relief.      Review of Systems  Neurological: Negative for tingling and numbness.  All other systems reviewed and are negative.      Objective:   Physical Exam  Constitutional: She is oriented to person, place, and time. She appears well-developed and well-nourished. No distress.  HENT:  Head: Normocephalic.  Eyes: Pupils are equal, round, and reactive to light.  Neck: Normal range of motion. Neck supple. No thyromegaly present.  Cardiovascular: Normal rate, regular rhythm, normal heart sounds and intact distal pulses.  No murmur heard. Pulmonary/Chest: Effort normal and breath sounds normal. No respiratory distress. She has no wheezes.  Abdominal: Soft. Bowel sounds are normal. She exhibits no distension. There is no tenderness.  Musculoskeletal: Normal range of motion. She exhibits tenderness. She exhibits no edema.  Right index finger distal aspect that is hard, tender, and erythemas   Neurological: She is alert and oriented to person, place, and time. She has normal reflexes. No cranial nerve deficit.  Skin: Skin is warm and dry.  Psychiatric: She has a normal mood and affect. Her behavior is normal. Judgment and thought content normal.  Vitals reviewed.     BP 119/79   Pulse 97   Temp 98.8 F (37.1 C) (Oral)   Ht 5\' 7"  (1.702 m)   Wt 214 lb 9.6 oz (97.3 kg)   BMI 33.61  kg/m      Assessment & Plan:  1. Paronychia of finger of right hand Warm soaks  Do not squeeze or pick at Keep clean and dry RTO if does not improve or worsens - mupirocin ointment (BACTROBAN) 2 %; Apply 1 application topically 2 (two) times daily.  Dispense: 22 g; Refill: 0 - cephALEXin (KEFLEX) 500 MG capsule; Take 1 capsule (500 mg total) by mouth 2 (two) times daily.  Dispense: 14 capsule; Refill: 0    Jannifer Rodney, FNP

## 2018-04-22 NOTE — Patient Instructions (Signed)
Paronychia Paronychia is an infection of the skin that surrounds a nail. It usually affects the skin around a fingernail, but it may also occur near a toenail. It often causes pain and swelling around the nail. This condition may come on suddenly or develop over a longer period. In some cases, a collection of pus (abscess) can form near or under the nail. Usually, paronychia is not serious and it clears up with treatment. What are the causes? This condition may be caused by bacteria or fungi. It is commonly caused by either Streptococcus or Staphylococcus bacteria. The bacteria or fungi often cause the infection by getting into the affected area through an opening in the skin, such as a cut or a hangnail. What increases the risk? This condition is more likely to develop in:  People who get their hands wet often, such as those who work as dishwashers, bartenders, or nurses.  People who bite their fingernails or suck their thumbs.  People who trim their nails too short.  People who have hangnails or injured fingertips.  People who get manicures.  People who have diabetes.  What are the signs or symptoms? Symptoms of this condition include:  Redness and swelling of the skin near the nail.  Tenderness around the nail when you touch the area.  Pus-filled bumps under the cuticle. The cuticle is the skin at the base or sides of the nail.  Fluid or pus under the nail.  Throbbing pain in the area.  How is this diagnosed? This condition is usually diagnosed with a physical exam. In some cases, a sample of pus may be taken from an abscess to be tested in a lab. This can help to determine what type of bacteria or fungi is causing the condition. How is this treated? Treatment for this condition depends on the cause and severity of the condition. If the condition is mild, it may clear up on its own in a few days. Your health care provider may recommend soaking the affected area in warm water a  few times a day. When treatment is needed, the options may include:  Antibiotic medicine, if the condition is caused by a bacterial infection.  Antifungal medicine, if the condition is caused by a fungal infection.  Incision and drainage, if an abscess is present. In this procedure, the health care provider will cut open the abscess so the pus can drain out.  Follow these instructions at home:  Soak the affected area in warm water if directed to do so by your health care provider. You may be told to do this for 20 minutes, 2-3 times a day. Keep the area dry in between soakings.  Take medicines only as directed by your health care provider.  If you were prescribed an antibiotic medicine, finish all of it even if you start to feel better.  Keep the affected area clean.  Do not try to drain a fluid-filled bump yourself.  If you will be washing dishes or performing other tasks that require your hands to get wet, wear rubber gloves. You should also wear gloves if your hands might come in contact with irritating substances, such as cleaners or chemicals.  Follow your health care provider's instructions about: ? Wound care. ? Bandage (dressing) changes and removal. Contact a health care provider if:  Your symptoms get worse or do not improve with treatment.  You have a fever or chills.  You have redness spreading from the affected area.  You have continued   or increased fluid, blood, or pus coming from the affected area.  Your finger or knuckle becomes swollen or is difficult to move. This information is not intended to replace advice given to you by your health care provider. Make sure you discuss any questions you have with your health care provider. Document Released: 04/29/2001 Document Revised: 04/10/2016 Document Reviewed: 10/11/2014 Elsevier Interactive Patient Education  2018 [employer]  

## 2018-04-22 NOTE — Telephone Encounter (Signed)
Sent to [place] in triage

## 2018-04-23 ENCOUNTER — Other Ambulatory Visit: Payer: Self-pay | Admitting: Physical Medicine & Rehabilitation

## 2018-04-23 DIAGNOSIS — M62838 Other muscle spasm: Secondary | ICD-10-CM

## 2018-04-23 DIAGNOSIS — M797 Fibromyalgia: Secondary | ICD-10-CM

## 2018-04-23 DIAGNOSIS — R569 Unspecified convulsions: Secondary | ICD-10-CM

## 2018-05-13 ENCOUNTER — Ambulatory Visit (INDEPENDENT_AMBULATORY_CARE_PROVIDER_SITE_OTHER): Payer: BLUE CROSS/BLUE SHIELD | Admitting: Nurse Practitioner

## 2018-05-13 ENCOUNTER — Encounter: Payer: Self-pay | Admitting: Nurse Practitioner

## 2018-05-13 VITALS — BP 137/94 | HR 99 | Temp 98.3°F | Ht 67.0 in | Wt 212.0 lb

## 2018-05-13 DIAGNOSIS — R569 Unspecified convulsions: Secondary | ICD-10-CM

## 2018-05-13 DIAGNOSIS — E559 Vitamin D deficiency, unspecified: Secondary | ICD-10-CM

## 2018-05-13 DIAGNOSIS — R1310 Dysphagia, unspecified: Secondary | ICD-10-CM

## 2018-05-13 DIAGNOSIS — D8989 Other specified disorders involving the immune mechanism, not elsewhere classified: Secondary | ICD-10-CM | POA: Diagnosis not present

## 2018-05-13 DIAGNOSIS — R600 Localized edema: Secondary | ICD-10-CM

## 2018-05-13 DIAGNOSIS — R609 Edema, unspecified: Secondary | ICD-10-CM

## 2018-05-13 DIAGNOSIS — E782 Mixed hyperlipidemia: Secondary | ICD-10-CM

## 2018-05-13 DIAGNOSIS — E079 Disorder of thyroid, unspecified: Secondary | ICD-10-CM | POA: Diagnosis not present

## 2018-05-13 DIAGNOSIS — M797 Fibromyalgia: Secondary | ICD-10-CM | POA: Diagnosis not present

## 2018-05-13 DIAGNOSIS — F3342 Major depressive disorder, recurrent, in full remission: Secondary | ICD-10-CM | POA: Diagnosis not present

## 2018-05-13 DIAGNOSIS — Z30013 Encounter for initial prescription of injectable contraceptive: Secondary | ICD-10-CM

## 2018-05-13 DIAGNOSIS — K219 Gastro-esophageal reflux disease without esophagitis: Secondary | ICD-10-CM

## 2018-05-13 DIAGNOSIS — E538 Deficiency of other specified B group vitamins: Secondary | ICD-10-CM

## 2018-05-13 DIAGNOSIS — M329 Systemic lupus erythematosus, unspecified: Secondary | ICD-10-CM | POA: Diagnosis not present

## 2018-05-13 DIAGNOSIS — I479 Paroxysmal tachycardia, unspecified: Secondary | ICD-10-CM

## 2018-05-13 DIAGNOSIS — M3219 Other organ or system involvement in systemic lupus erythematosus: Secondary | ICD-10-CM

## 2018-05-13 DIAGNOSIS — K581 Irritable bowel syndrome with constipation: Secondary | ICD-10-CM | POA: Diagnosis not present

## 2018-05-13 MED ORDER — ATORVASTATIN CALCIUM 40 MG PO TABS: ORAL_TABLET | ORAL | 1 refills | Status: DC

## 2018-05-13 MED ORDER — DEXLANSOPRAZOLE 60 MG PO CPDR: DELAYED_RELEASE_CAPSULE | ORAL | 1 refills | Status: DC

## 2018-05-13 MED ORDER — GABAPENTIN 600 MG PO TABS: ORAL_TABLET | ORAL | 2 refills | Status: DC

## 2018-05-13 MED ORDER — METOPROLOL SUCCINATE ER 25 MG PO TB24: ORAL_TABLET | ORAL | 1 refills | Status: DC

## 2018-05-13 MED ORDER — BACLOFEN 20 MG PO TABS: ORAL_TABLET | ORAL | 1 refills | Status: DC

## 2018-05-13 MED ORDER — FUROSEMIDE 20 MG PO TABS: 20.0000 mg | ORAL_TABLET | Freq: Every day | ORAL | 1 refills | Status: DC

## 2018-05-13 NOTE — Patient Instructions (Signed)

## 2018-05-13 NOTE — Addendum Note (Signed)
Addended by: Bennie PieriniMARTIN, MARY-MARGARET on: 05/13/2018 04:48 PM   Modules accepted: Orders

## 2018-05-13 NOTE — Progress Notes (Signed)
Subjective:    Patient ID: Autumn Davis, female    DOB: July 27, 1986, 32 y.o.   MRN: [id number]   Chief Complaint: Medical Management of Chronic issues  HPI:  1. Tachycardia, paroxysmal (HCC)  no episode of tachycardia in the last 3 months  2. Irritable bowel syndrome with constipation  She says she still takes collapse daily but she has been doing really good lately  3. Gastroesophageal reflux disease without esophagitis  Is on dexilant daily ad that keeps her symptoms under control  4. Dysphagia, unspecified type  Has not been having any problems lately with swallowing  5. Thyroid disease  Not having any problems lately  6. Vitamin D deficiency  Takes supplement daily  7. Seizures (HCC)  Has had no seizure activity in awhile.   8. Systemic lupus erythematosus, unspecified SLE type, unspecified organ involvement status (HCC) she has not seen specialist lately because they are not sure that is what she has. So she only sees them when have flare up.  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 But they are treating her as autoimmune   10. Mixed hyperlipidemia  Has really been watching diet. And has actually lost over 20lbs.  11. Fibromyalgia goes to pain management- working well  12. Recurrent major depressive disorder, in full remission (HCC)  Right now she is doing well. The custody of her son being settled has really helped her mood. Depression screen [facility] 2/9 05/13/2018 04/22/2018 02/12/2018  Decreased Interest 1 0 0  Down, Depressed, Hopeless 0 0 0  PHQ - 2 Score 1 0 0  Altered sleeping - - 0  Tired, decreased energy - - 0  Change in appetite - - 0  Feeling bad or failure about yourself  - - 0  Trouble concentrating - - 0  Moving slowly or fidgety/restless - - 0  Suicidal thoughts - - 0  PHQ-9 Score - - 0       Outpatient Encounter Medications as of 05/13/2018  Medication Sig  . acetaZOLAMIDE (DIAMOX) 250 MG tablet  TAKE 1 TABLET EVERY MORNING, TAKE 1 TABLET AT 2PM, AND TAKE 2 TABLETSAT BEDTIME  . albuterol (PROVENTIL HFA;VENTOLIN HFA) 108 (90 BASE) MCG/ACT inhaler Inhale 2 puffs into the lungs every 6 (six) hours as needed for wheezing or shortness of breath.  Marland Kitchen amitriptyline (ELAVIL) 75 MG tablet Take 1 tablet (75 mg total) by mouth at bedtime.  Marland Kitchen atorvastatin (LIPITOR) 40 MG tablet TAKE ONE (1) TABLET EACH DAY  . baclofen (LIORESAL) 20 MG tablet TAKE ONE TABLET FOUR TIMES DAILY  . Beclomethasone Dipropionate (QNASL CHILDRENS) 40 MCG/ACT AERS Place into the nose.  . Calcium-Magnesium-Vitamin D (CALCIUM 1200+D3 PO) Take 1 tablet by mouth daily.  . cephALEXin (KEFLEX) 500 MG capsule Take 1 capsule (500 mg total) by mouth 2 (two) times daily.  . cetirizine (ZYRTEC) 10 MG tablet Take 10 mg by mouth daily.  . Cholecalciferol (VITAMIN D3) 50000 units CAPS Take 50,000 Units by mouth once a week.  . cyanocobalamin (,VITAMIN B-12,) 1000 MCG/ML injection 1ml im QW for 4 weeks then QOW for 4 weeks then monthly  . cycloSPORINE (RESTASIS) 0.05 % ophthalmic emulsion Place 1 drop into both eyes 2 (two) times daily.  Marland Kitchen dexlansoprazole (DEXILANT) 60 MG capsule TAKE ONE (1) CAPSULE EACH DAY  . diclofenac (VOLTAREN) 75 MG EC tablet TAKE ONE TABLET BY MOUTH TWICE DAILY  . docusate sodium (COLACE) 100 MG capsule Take 100 mg  by mouth 2 (two) times daily. PRN  . EPINEPHRINE 0.3 mg/0.3 mL IJ SOAJ injection USE AS DIRECTED  . FORTEO 600 MCG/2.4ML SOLN Inject 20 mcg into the skin at bedtime.  . furosemide (LASIX) 20 MG tablet Take 1 tablet (20 mg total) by mouth daily.  Marland Kitchen gabapentin (NEURONTIN) 600 MG tablet TAKE ONE TABLET FIVE TIMES DAILY  . ipratropium-albuterol (DUONEB) 0.5-2.5 (3) MG/3ML SOLN Take 3 mLs by nebulization every 4 (four) hours as needed.  Marland Kitchen L-Methylfolate-Algae (L-METHYLFOLATE FORTE) 15-90.314 MG CAPS TAKE ONE (1) CAPSULE EACH DAY  . medroxyPROGESTERone (DEPO-PROVERA) 150 MG/ML injection Inject 150 mg into the  muscle as directed. Every 2 months  . metoprolol succinate (TOPROL-XL) 25 MG 24 hr tablet TAKE ONE (1) TABLET EACH DAY  . Miconazole 50 MG TABS Place 1 tablet (50 mg total) inside cheek daily.  . montelukast (SINGULAIR) 10 MG tablet TAKE ONE TABLET DAILY AT BEDTIME  . mupirocin ointment (BACTROBAN) 2 % Apply 1 application topically 2 (two) times daily.  Marland Kitchen nystatin (MYCOSTATIN) 100000 UNIT/ML suspension   . nystatin-triamcinolone ointment (MYCOLOG) Apply 1 application topically 2 (two) times daily.  . Omega 3 1000 MG CAPS Take 1 capsule by mouth daily.  Marland Kitchen oxyCODONE-acetaminophen (PERCOCET) 7.5-325 MG tablet Take 1 tablet by mouth 5 (five) times daily as needed for severe pain. No More Than 5 a day  . predniSONE (DELTASONE) 5 MG tablet Take 1 tablet (5 mg total) by mouth every morning.  . promethazine (PHENERGAN) 12.5 MG tablet Take 1 tablet (12.5 mg total) by mouth every 8 (eight) hours as needed for nausea or vomiting.  . promethazine (PHENERGAN) 25 MG tablet TAKE 1/2 TABLET EVERY 8 HOURS AS NEEDED FOR NAUSEA AND VOMITING  . tamsulosin (FLOMAX) 0.4 MG CAPS capsule TAKE ONE (1) CAPSULE EACH DAY  . vitamin E (VITAMIN E) 400 UNIT capsule Take 400 Units by mouth daily.       New complaints: Nothing new today  Social history: Is on disability- they are currently trying to take it away from her. She is going to have to go to court in order to keep. Patient is not able to hold down a full time job with her medical problems.   Review of Systems  Constitutional: Negative for activity change and appetite change.  HENT: Negative.   Eyes: Negative for pain.  Respiratory: Negative for shortness of breath.   Cardiovascular: Negative for chest pain, palpitations and leg swelling.  Gastrointestinal: Negative for abdominal pain.  Endocrine: Negative for polydipsia.  Genitourinary: Negative.   Skin: Negative for rash.  Neurological: Negative for dizziness, weakness and headaches.  Hematological:  Does not bruise/bleed easily.  Psychiatric/Behavioral: Negative.   All other systems reviewed and are negative.      Objective:   Physical Exam  Constitutional: She is oriented to person, place, and time. She appears well-developed and well-nourished. No distress.  HENT:  Head: Normocephalic.  Nose: Nose normal.  Mouth/Throat: Oropharynx is clear and moist.  Eyes: Pupils are equal, round, and reactive to light. EOM are normal.  Neck: Normal range of motion. Neck supple. No JVD present. Carotid bruit is not present.  Cardiovascular: Normal rate, regular rhythm, normal heart sounds and intact distal pulses.  Pulmonary/Chest: Effort normal and breath sounds normal. No respiratory distress. She has no wheezes. She has no rales. She exhibits no tenderness.  Abdominal: Soft. Normal appearance, normal aorta and bowel sounds are normal. She exhibits no distension, no abdominal bruit, no pulsatile midline mass and no  mass. There is no splenomegaly or hepatomegaly. There is no tenderness.  Musculoskeletal: Normal range of motion. She exhibits no edema.  Gait slow and stead Multiple tender points up and down backy  Lymphadenopathy:    She has no cervical adenopathy.  Neurological: She is alert and oriented to person, place, and time. She has normal reflexes.  Skin: Skin is warm and dry.  Psychiatric: She has a normal mood and affect. Her behavior is normal. Judgment and thought content normal.  Nursing note and vitals reviewed.   BP (!) 137/94   Pulse 99   Temp 98.3 F (36.8 C) (Oral)   Ht 5\' 7"  (1.702 m)   Wt 212 lb (96.2 kg)   BMI 33.20 kg/m        Assessment & Plan:  Jaelyne Loiselle comes in today with chief complaint of Medical Management of Chronic Issues   Diagnosis and orders addressed:  1. Tachycardia, paroxysmal (HCC) Avoid caffeine - metoprolol succinate (TOPROL-XL) 25 MG 24 hr tablet; TAKE ONE (1) TABLET EACH DAY  Dispense: 90 tablet; Refill: 1  2. Irritable bowel  syndrome with constipation Continue to watch diet - baclofen (LIORESAL) 20 MG tablet; TAKE ONE TABLET FOUR TIMES DAILY  Dispense: 360 each; Refill: 1  3. Gastroesophageal reflux disease without esophagitis Avoid spicy foods Do not eat 2 hours prior to bedtime - dexlansoprazole (DEXILANT) 60 MG capsule; TAKE ONE (1) CAPSULE EACH DAY  Dispense: 90 capsule; Refill: 1  4. Dysphagia, unspecified type Chew food well  5. Thyroid disease - Thyroid Panel With TSH  6. Vitamin D deficiency  7. Seizures (HCC)  8. Systemic lupus erythematosus, unspecified SLE type, unspecified organ involvement status (HCC) Will see specialist when needed  9. Inflammatory autoimmune disorder (HCC) Again will see specialist when need to  10. Mixed hyperlipidemia Low fat diet - atorvastatin (LIPITOR) 40 MG tablet; TAKE ONE (1) TABLET EACH DAY  Dispense: 90 tablet; Refill: 1 - CMP14+EGFR - Lipid panel  11. Fibromyalgia exercise  12. Recurrent major depressive disorder, in full remission (HCC) stess management  13. Peripheral edema Elevate legs when sitting - furosemide (LASIX) 20 MG tablet; Take 1 tablet (20 mg total) by mouth daily.  Dispense: 90 tablet; Refill: 1  14. Systemic lupus erythematosus with other organ involvement, unspecified SLE type (HCC) - gabapentin (NEURONTIN) 600 MG tablet; TAKE ONE TABLET FIVE TIMES DAILY  Dispense: 450 tablet; Refill: 2   Labs pending Health Maintenance reviewed Diet and exercise encouraged  Follow up plan: 3  months  Mary-Margaret Daphine Deutscher, FNP

## 2018-05-14 LAB — THYROID PANEL WITH TSH
FREE THYROXINE INDEX: 1.5 (ref 1.2–4.9)
T3 UPTAKE RATIO: 27 % (ref 24–39)
T4, Total: 5.4 ug/dL (ref 4.5–12.0)
TSH: 1.22 u[IU]/mL (ref 0.450–4.500)

## 2018-05-14 LAB — CMP14+EGFR
ALK PHOS: 101 IU/L (ref 39–117)
ALT: 23 IU/L (ref 0–32)
AST: 18 IU/L (ref 0–40)
Albumin/Globulin Ratio: 2.2 (ref 1.2–2.2)
Albumin: 4.7 g/dL (ref 3.5–5.5)
BUN/Creatinine Ratio: 9 (ref 9–23)
BUN: 7 mg/dL (ref 6–20)
Bilirubin Total: 0.3 mg/dL (ref 0.0–1.2)
CHLORIDE: 111 mmol/L — AB (ref 96–106)
CO2: 20 mmol/L (ref 20–29)
CREATININE: 0.79 mg/dL (ref 0.57–1.00)
Calcium: 9.5 mg/dL (ref 8.7–10.2)
GFR calc Af Amer: 113 mL/min/{1.73_m2} (ref >59)
GFR calc non Af Amer: 98 mL/min/{1.73_m2} (ref >59)
GLUCOSE: 111 mg/dL — AB (ref 65–99)
Globulin, Total: 2.1 g/dL (ref 1.5–4.5)
Potassium: 3.7 mmol/L (ref 3.5–5.2)
SODIUM: 144 mmol/L (ref 134–144)
Total Protein: 6.8 g/dL (ref 6.0–8.5)

## 2018-05-14 LAB — LIPID PANEL
Chol/HDL Ratio: 3.6 ratio (ref 0.0–4.4)
Cholesterol, Total: 190 mg/dL (ref 100–199)
HDL: 53 mg/dL (ref >39)
LDL CALC: 87 mg/dL (ref 0–99)
Triglycerides: 249 mg/dL — ABNORMAL HIGH (ref 0–149)
VLDL CHOLESTEROL CAL: 50 mg/dL — AB (ref 5–40)

## 2018-05-14 LAB — VITAMIN B12: VITAMIN B 12: 437 pg/mL (ref 232–1245)

## 2018-05-17 ENCOUNTER — Encounter: Payer: BLUE CROSS/BLUE SHIELD | Attending: Physical Medicine & Rehabilitation | Admitting: Registered Nurse

## 2018-05-17 ENCOUNTER — Encounter: Payer: Self-pay | Admitting: Registered Nurse

## 2018-05-17 VITALS — BP 111/77 | HR 86 | Ht 67.0 in | Wt 213.4 lb

## 2018-05-17 DIAGNOSIS — M545 Low back pain: Secondary | ICD-10-CM | POA: Insufficient documentation

## 2018-05-17 DIAGNOSIS — M6249 Contracture of muscle, multiple sites: Secondary | ICD-10-CM | POA: Insufficient documentation

## 2018-05-17 DIAGNOSIS — M797 Fibromyalgia: Secondary | ICD-10-CM | POA: Diagnosis not present

## 2018-05-17 DIAGNOSIS — Z79899 Other long term (current) drug therapy: Secondary | ICD-10-CM

## 2018-05-17 DIAGNOSIS — M329 Systemic lupus erythematosus, unspecified: Secondary | ICD-10-CM | POA: Insufficient documentation

## 2018-05-17 DIAGNOSIS — G894 Chronic pain syndrome: Secondary | ICD-10-CM | POA: Diagnosis not present

## 2018-05-17 DIAGNOSIS — Z5181 Encounter for therapeutic drug level monitoring: Secondary | ICD-10-CM | POA: Diagnosis not present

## 2018-05-17 DIAGNOSIS — D8989 Other specified disorders involving the immune mechanism, not elsewhere classified: Secondary | ICD-10-CM

## 2018-05-17 DIAGNOSIS — G8929 Other chronic pain: Secondary | ICD-10-CM

## 2018-05-17 DIAGNOSIS — R569 Unspecified convulsions: Secondary | ICD-10-CM | POA: Insufficient documentation

## 2018-05-17 DIAGNOSIS — M546 Pain in thoracic spine: Secondary | ICD-10-CM | POA: Diagnosis not present

## 2018-05-17 DIAGNOSIS — M62838 Other muscle spasm: Secondary | ICD-10-CM

## 2018-05-17 DIAGNOSIS — G609 Hereditary and idiopathic neuropathy, unspecified: Secondary | ICD-10-CM | POA: Diagnosis not present

## 2018-05-17 MED ORDER — OXYCODONE-ACETAMINOPHEN 7.5-325 MG PO TABS: 1.0000 | ORAL_TABLET | Freq: Every day | ORAL | 0 refills | Status: DC | PRN

## 2018-05-17 NOTE — Progress Notes (Signed)
Subjective:    Patient ID: Autumn Davis, female    DOB: 1985/12/10, 32 y.o.   MRN: [id number]  HPI: [name] is a 32 year old female who returns for follow up appointment for chronic pain and medication refill. She states her pain is located in her ribs, lower back and bilateral hips. She rates her pain 5. Her current exercise regime is walking and performing stretching exercises.   [name] states she's going on a cruise next month with family.   [name] Morphine equivalent is 56.25 MME. Last UDS was Performed on 12/29/2017, it was consistent.   Pain Inventory Average Pain 8 Pain Right Now 5 My pain is constant, sharp, burning, stabbing and tingling  In the last 24 hours, has pain interfered with the following? General activity 7 Relation with others 5 Enjoyment of life 3 What TIME of day is your pain at its worst? evening Sleep (in general) Poor  Pain is worse with: walking, bending, sitting, inactivity, standing and some activites Pain improves with: rest, heat/ice, therapy/exercise, pacing activities, medication and TENS Relief from Meds: 5  Mobility walk without assistance walk with assistance use a cane ability to climb steps?  yes do you drive?  no  Function I need assistance with the following:  meal prep, household duties and shopping  Neuro/Psych bladder control problems bowel control problems weakness numbness tremor tingling trouble walking spasms dizziness  Prior Studies Any changes since last visit?  no  Physicians involved in your care Any changes since last visit?  no   Family History  Problem Relation Age of Onset  . Thyroid disease Mother   . Colon polyps Father   . Lung cancer Maternal Grandmother   . Cancer Paternal Grandmother        colon/pancreatiec/lymphoma  . Irritable bowel syndrome Brother    Social History   Socioeconomic History  . Marital status: Married    Spouse name: Not on file  . Number of children: Not  on file  . Years of education: Not on file  . Highest education level: Not on file  Occupational History  . Occupation: disabled    Employer: [employer]  Social Needs  . Financial resource strain: Not on file  . Food insecurity:    Worry: Not on file    Inability: Not on file  . Transportation needs:    Medical: Not on file    Non-medical: Not on file  Tobacco Use  . Smoking status: Never Smoker  . Smokeless tobacco: Never Used  Substance and Sexual Activity  . Alcohol use: No    Alcohol/week: 0.0 oz  . Drug use: No  . Sexual activity: Not on file  Lifestyle  . Physical activity:    Days per week: Not on file    Minutes per session: Not on file  . Stress: Not on file  Relationships  . Social connections:    Talks on phone: Not on file    Gets together: Not on file    Attends religious service: Not on file    Active member of club or organization: Not on file    Attends meetings of clubs or organizations: Not on file    Relationship status: Not on file  Other Topics Concern  . Not on file  Social History Narrative  . Not on file   Past Surgical History:  Procedure Laterality Date  . 24 HOUR PH STUDY N/A 10/29/2015   Procedure: 24 HOUR  PH STUDY;  Surgeon: Iva Boop, MD;  Location: [facility];  Service: Endoscopy;  Laterality: N/A;  . COLONOSCOPY    . ESOPHAGEAL MANOMETRY N/A 10/29/2015   Procedure: ESOPHAGEAL MANOMETRY (EM);  Surgeon: Iva Boop, MD;  Location: [facility];  Service: Endoscopy;  Laterality: N/A;  . KNEE SURGERY  2002/2003   bil  . RECTAL SURGERY  correction of prolapse   2010   Past Medical History:  Diagnosis Date  . Allergy   . Arthritis    HANDS,HIPS,KNEES  . Bronchitis, chronic/intermittent 01/22/2012  . Depression 01/22/2012  . GERD (gastroesophageal reflux disease)   . Hyperlipidemia   . IBS (irritable bowel syndrome) 01/22/2012  . Lupus (HCC)   . Neuromuscular disorder (HCC)   . Osteoporosis   . Seizures (HCC)    . SOB (shortness of breath)   . Thyroid disease    BP 111/77   Pulse 86   Ht 5\' 7"  (1.702 m)   Wt 213 lb 6.4 oz (96.8 kg)   SpO2 98%   BMI 33.42 kg/m   Opioid Risk Score:   Fall Risk Score:  `1  Depression screen PHQ 2/9  Depression screen [facility] 2/9 05/13/2018 04/22/2018 02/12/2018 02/09/2018 11/20/2017 11/06/2017 05/14/2017  Decreased Interest 1 0 0 1 1 1  0  Down, Depressed, Hopeless 0 0 0 0 1 1 0  PHQ - 2 Score 1 0 0 1 2 2  0  Altered sleeping - - 0 - - 3 -  Tired, decreased energy - - 0 - - 2 -  Change in appetite - - 0 - - 2 -  Feeling bad or failure about yourself  - - 0 - - 0 -  Trouble concentrating - - 0 - - 1 -  Moving slowly or fidgety/restless - - 0 - - 0 -  Suicidal thoughts - - 0 - - 0 -  PHQ-9 Score - - 0 - - 10 -     Review of Systems  Constitutional: Negative.   HENT: Negative.   Eyes: Negative.   Respiratory: Negative.   Cardiovascular: Negative.   Gastrointestinal: Negative.   Endocrine: Negative.   Genitourinary: Positive for difficulty urinating.  Musculoskeletal: Positive for arthralgias, back pain, gait problem and myalgias.  Allergic/Immunologic: Negative.   Neurological: Positive for dizziness, weakness and numbness.  Hematological: Negative.   Psychiatric/Behavioral: Negative.   All other systems reviewed and are negative.      Objective:   Physical Exam  Constitutional: She is oriented to person, place, and time. She appears well-developed and well-nourished.  HENT:  Head: Normocephalic and atraumatic.  Neck: Normal range of motion. Neck supple.  Cardiovascular: Normal rate and regular rhythm.  Pulmonary/Chest: Effort normal and breath sounds normal.  Musculoskeletal:  Normal Muscle Bulk and Muscle Testing Reveals: Upper Extremities: Full ROM and Muscle Strength 5/5 Thoracic Paraspinal Tenderness: T-7-T-9 Lumbar Paraspinal Tenderness: L-3-L-5 Lower Extremities: Full ROM and Muscle Strength 5/5 Arises from Table with Ease Narrow Based  Gait  Neurological: She is alert and oriented to person, place, and time.  Skin: Skin is warm and dry.  Psychiatric: She has a normal mood and affect. Her behavior is normal.  Nursing note and vitals reviewed.         Assessment & Plan:  1. Chronic seizure disorder: No seizure's. Continue current medication regimen with  Gabapentin. Neurology Following.05/17/2018 2. Chronic muscle spasms, weakness with associated pain disorder: Continue current medication regimen with Baclofen. Continue with Exercise regime.05/17/2018 3. Chronic dysphagia: GI  Following.05/17/2018 4. Anxiety with depression : Stable. Continue current medication regimen with  Elavil.05/17/2018 5. Fibromyalgia/ Rib Pain/Chronic Pain:Continue Diclofenac: Continue with exercise and heat Therapy. 05/17/2018 Refilled: oxyCODONE 7.5/325mg  onetablet 5 times daily as needed #150. We will continue opioid monitoring program, this consists of regular clinic visits, examinations, urine drug screen, pill counts as well as use of [place] Controlled Substance Reporting System. 6. Peripheral Neuropathy:Continue current medication regimen with Gabapentin: 05/17/2018  20 minutes of face to face patient care time was spent during this visit. All questions were encouraged and answered.  F/U in 1 month

## 2018-05-18 ENCOUNTER — Other Ambulatory Visit: Payer: Self-pay | Admitting: Nurse Practitioner

## 2018-06-18 ENCOUNTER — Encounter: Payer: BLUE CROSS/BLUE SHIELD | Attending: Physical Medicine & Rehabilitation | Admitting: Registered Nurse

## 2018-06-18 ENCOUNTER — Encounter: Payer: BLUE CROSS/BLUE SHIELD | Admitting: Registered Nurse

## 2018-06-18 ENCOUNTER — Encounter: Payer: Self-pay | Admitting: Registered Nurse

## 2018-06-18 VITALS — BP 122/80 | HR 77 | Resp 14 | Ht 68.0 in | Wt 206.0 lb

## 2018-06-18 DIAGNOSIS — R569 Unspecified convulsions: Secondary | ICD-10-CM | POA: Insufficient documentation

## 2018-06-18 DIAGNOSIS — D8989 Other specified disorders involving the immune mechanism, not elsewhere classified: Secondary | ICD-10-CM | POA: Diagnosis not present

## 2018-06-18 DIAGNOSIS — Z79891 Long term (current) use of opiate analgesic: Secondary | ICD-10-CM | POA: Diagnosis not present

## 2018-06-18 DIAGNOSIS — M545 Low back pain: Secondary | ICD-10-CM | POA: Insufficient documentation

## 2018-06-18 DIAGNOSIS — M546 Pain in thoracic spine: Secondary | ICD-10-CM

## 2018-06-18 DIAGNOSIS — M6249 Contracture of muscle, multiple sites: Secondary | ICD-10-CM | POA: Insufficient documentation

## 2018-06-18 DIAGNOSIS — M797 Fibromyalgia: Secondary | ICD-10-CM | POA: Diagnosis not present

## 2018-06-18 DIAGNOSIS — M62838 Other muscle spasm: Secondary | ICD-10-CM

## 2018-06-18 DIAGNOSIS — G609 Hereditary and idiopathic neuropathy, unspecified: Secondary | ICD-10-CM | POA: Diagnosis not present

## 2018-06-18 DIAGNOSIS — Z5181 Encounter for therapeutic drug level monitoring: Secondary | ICD-10-CM

## 2018-06-18 DIAGNOSIS — G894 Chronic pain syndrome: Secondary | ICD-10-CM | POA: Diagnosis not present

## 2018-06-18 DIAGNOSIS — M329 Systemic lupus erythematosus, unspecified: Secondary | ICD-10-CM | POA: Insufficient documentation

## 2018-06-18 DIAGNOSIS — G8929 Other chronic pain: Secondary | ICD-10-CM | POA: Diagnosis not present

## 2018-06-18 MED ORDER — OXYCODONE-ACETAMINOPHEN 7.5-325 MG PO TABS: 1.0000 | ORAL_TABLET | Freq: Every day | ORAL | 0 refills | Status: DC | PRN

## 2018-06-18 NOTE — Progress Notes (Signed)
Subjective:    Patient ID: Autumn Davis, female    DOB: 08-21-1986, 32 y.o.   MRN: [id number]  HPI: Autumn Davis is a 32 year old female who returns for follow up appointment for chronic pain and medication refill. She states her pain is located in her bilateral hands with tingling and numbness, ribs, also reports mid- lower back pain with tingling and numbness and bilateral hips pain. Also reports she has been experiencing increase neuropathic pain, she doesn't want to increase her gabapentin at this time. We will continue to monitor she verbalizes understanding. She rates her pain 4. Her current exercise regime is walking.   Autumn Davis Morphine equivalent is 56.25 MME. Last UDS was Performed on 12/29/2017 it was consistent.    Pain Inventory Average Pain 8 Pain Right Now 4 My pain is constant, sharp, burning and tingling  In the last 24 hours, has pain interfered with the following? General activity 6 Relation with others 6 Enjoyment of life 4 What TIME of day is your pain at its worst? evening, night  Sleep (in general) Poor  Pain is worse with: walking, bending, sitting, inactivity, standing and some activites Pain improves with: rest, heat/ice, therapy/exercise, pacing activities, medication and TENS Relief from Meds: 5  Mobility walk without assistance ability to climb steps?  yes do you drive?  no transfers alone Do you have any goals in this area?  yes  Function I need assistance with the following:  meal prep, household duties and shopping Do you have any goals in this area?  yes  Neuro/Psych bladder control problems bowel control problems weakness numbness tremor tingling trouble walking spasms dizziness  Prior Studies Any changes since last visit?  no  Physicians involved in your care Any changes since last visit?  no   Family History  Problem Relation Age of Onset  . Thyroid disease Mother   . Colon polyps Father   . Lung cancer Maternal  Grandmother   . Cancer Paternal Grandmother        colon/pancreatiec/lymphoma  . Irritable bowel syndrome Brother    Social History   Socioeconomic History  . Marital status: Married    Spouse name: Not on file  . Number of children: Not on file  . Years of education: Not on file  . Highest education level: Not on file  Occupational History  . Occupation: disabled    Employer: [employer]  Social Needs  . Financial resource strain: Not on file  . Food insecurity:    Worry: Not on file    Inability: Not on file  . Transportation needs:    Medical: Not on file    Non-medical: Not on file  Tobacco Use  . Smoking status: Never Smoker  . Smokeless tobacco: Never Used  Substance and Sexual Activity  . Alcohol use: No    Alcohol/week: 0.0 oz  . Drug use: No  . Sexual activity: Not on file  Lifestyle  . Physical activity:    Days per week: Not on file    Minutes per session: Not on file  . Stress: Not on file  Relationships  . Social connections:    Talks on phone: Not on file    Gets together: Not on file    Attends religious service: Not on file    Active member of club or organization: Not on file    Attends meetings of clubs or organizations: Not on file    Relationship status: Not  on file  Other Topics Concern  . Not on file  Social History Narrative  . Not on file   Past Surgical History:  Procedure Laterality Date  . 24 HOUR PH STUDY N/A 10/29/2015   Procedure: 24 HOUR PH STUDY;  Surgeon: Iva Boop, MD;  Location: [facility];  Service: Endoscopy;  Laterality: N/A;  . COLONOSCOPY    . ESOPHAGEAL MANOMETRY N/A 10/29/2015   Procedure: ESOPHAGEAL MANOMETRY (EM);  Surgeon: Iva Boop, MD;  Location: [facility];  Service: Endoscopy;  Laterality: N/A;  . KNEE SURGERY  2002/2003   bil  . RECTAL SURGERY  correction of prolapse   2010   Past Medical History:  Diagnosis Date  . Allergy   . Arthritis    HANDS,HIPS,KNEES  . Bronchitis,  chronic/intermittent 01/22/2012  . Depression 01/22/2012  . GERD (gastroesophageal reflux disease)   . Hyperlipidemia   . IBS (irritable bowel syndrome) 01/22/2012  . Lupus (HCC)   . Neuromuscular disorder (HCC)   . Osteoporosis   . Seizures (HCC)   . SOB (shortness of breath)   . Thyroid disease    BP 122/80 (BP Location: Right Arm, Patient Position: Sitting, Cuff Size: Large)   Pulse 77   Resp 14   Ht 5\' 8"  (1.727 m)   Wt 206 lb (93.4 kg)   SpO2 97%   BMI 31.32 kg/m   Opioid Risk Score:   Fall Risk Score:  `1  Depression screen PHQ 2/9  Depression screen [facility] 2/9 05/13/2018 04/22/2018 02/12/2018 02/09/2018 11/20/2017 11/06/2017 05/14/2017  Decreased Interest 1 0 0 1 1 1  0  Down, Depressed, Hopeless 0 0 0 0 1 1 0  PHQ - 2 Score 1 0 0 1 2 2  0  Altered sleeping - - 0 - - 3 -  Tired, decreased energy - - 0 - - 2 -  Change in appetite - - 0 - - 2 -  Feeling bad or failure about yourself  - - 0 - - 0 -  Trouble concentrating - - 0 - - 1 -  Moving slowly or fidgety/restless - - 0 - - 0 -  Suicidal thoughts - - 0 - - 0 -  PHQ-9 Score - - 0 - - 10 -    Review of Systems  Constitutional: Negative.   HENT: Negative.   Eyes: Negative.   Respiratory: Negative.   Cardiovascular: Positive for chest pain.  Gastrointestinal: Negative.        Bowel control problems   Endocrine: Negative.   Genitourinary: Positive for difficulty urinating.  Musculoskeletal: Positive for back pain and gait problem.       Spasms   Skin: Negative.   Allergic/Immunologic: Negative.   Neurological: Positive for dizziness, tremors, weakness and numbness.       Neuropathy Tingling   All other systems reviewed and are negative.      Objective:   Physical Exam  Constitutional: She is oriented to person, place, and time. She appears well-developed and well-nourished.  HENT:  Head: Normocephalic and atraumatic.  Neck: Normal range of motion. Neck supple.  Cardiovascular: Normal rate and regular rhythm.    Pulmonary/Chest: Effort normal and breath sounds normal.  Musculoskeletal:  Normal Muscle Bulk and Muscle Testing Reveals: Upper Extremities: Full ROM and Muscle Strength 5/5 Thoracic Paraspinal Tenderness: T-7-T-9 Lower Extremities: Full ROM and Muscle Strength 5/5 Arises from Table with ease Narrow Based Gait  Neurological: She is alert and oriented to person, place, and time.  Skin:  Skin is warm and dry.  Psychiatric: She has a normal mood and affect. Her behavior is normal.  Nursing note and vitals reviewed.         Assessment & Plan:  1. Chronic seizure disorder: No seizure's. Continuecurrent medication regimen withGabapentin. Neurology Following.06/18/2018 2. Chronic muscle spasms, weakness with associated pain disorder: Continuecurrent medication regimen withBaclofen. Continue with Exercise regime.06/18/2018 3. Chronic dysphagia: GI Following.06/18/2018 4. Anxiety with depression : Stable. Continuecurrent medication regimen withElavil.06/18/2018 5. Fibromyalgia/ Rib Pain/Chronic Pain:Continue Diclofenac: Continue with exercise and heat Therapy. 06/18/2018 Refilled: oxyCODONE 7.5/325mg  onetablet 5 times daily as needed #150. We will continue opioid monitoring program, this consists of regular clinic visits, examinations, urine drug screen, pill counts as well as use of [place] Controlled Substance Reporting System. 6. Peripheral Neuropathy:Continuecurrent medication regimen withGabapentin: 06/18/2018  20 minutes of face to face patient care time was spent during this visit. All questions were encouraged and answered.  F/U in 1 month

## 2018-06-23 ENCOUNTER — Other Ambulatory Visit: Payer: Self-pay | Admitting: Nurse Practitioner

## 2018-06-23 LAB — DRUG TOX MONITOR 1 W/CONF, ORAL FLD
Amphetamines: NEGATIVE ng/mL (ref <10)
Barbiturates: NEGATIVE ng/mL (ref <10)
Benzodiazepines: NEGATIVE ng/mL (ref <0.50)
Buprenorphine: NEGATIVE ng/mL (ref <0.10)
COCAINE: NEGATIVE ng/mL (ref <5.0)
Codeine: NEGATIVE ng/mL (ref <2.5)
DIHYDROCODEINE: NEGATIVE ng/mL (ref <2.5)
Fentanyl: NEGATIVE ng/mL (ref <0.10)
HYDROCODONE: NEGATIVE ng/mL (ref <2.5)
Heroin Metabolite: NEGATIVE ng/mL (ref <1.0)
Hydromorphone: NEGATIVE ng/mL (ref <2.5)
MARIJUANA: NEGATIVE ng/mL (ref <2.5)
MDMA: NEGATIVE ng/mL (ref <10)
Meprobamate: NEGATIVE ng/mL (ref <2.5)
Methadone: NEGATIVE ng/mL (ref <5.0)
Morphine: NEGATIVE ng/mL (ref <2.5)
NICOTINE METABOLITE: NEGATIVE ng/mL (ref <5.0)
Norhydrocodone: NEGATIVE ng/mL (ref <2.5)
Noroxycodone: 26.9 ng/mL — ABNORMAL HIGH (ref <2.5)
OXYMORPHONE: NEGATIVE ng/mL (ref <2.5)
Opiates: POSITIVE ng/mL — AB (ref <2.5)
Oxycodone: 138 ng/mL — ABNORMAL HIGH (ref <2.5)
Phencyclidine: NEGATIVE ng/mL (ref <10)
TAPENTADOL: NEGATIVE ng/mL (ref <5.0)
TRAMADOL: NEGATIVE ng/mL (ref <5.0)
ZOLPIDEM: NEGATIVE ng/mL (ref <5.0)

## 2018-06-23 LAB — DRUG TOX ALC METAB W/CON, ORAL FLD: Alcohol Metabolite: NEGATIVE ng/mL (ref <25)

## 2018-06-23 MED ORDER — SCOPOLAMINE 1 MG/3DAYS TD PT72: 1.0000 | MEDICATED_PATCH | TRANSDERMAL | 1 refills | Status: AC

## 2018-06-23 NOTE — Progress Notes (Signed)
sco

## 2018-06-25 ENCOUNTER — Telehealth: Payer: Self-pay | Admitting: *Deleted

## 2018-06-25 NOTE — Telephone Encounter (Signed)
Oral swab drug screen was consistent for prescribed medications.  ?

## 2018-07-15 ENCOUNTER — Other Ambulatory Visit: Payer: Self-pay | Admitting: Nurse Practitioner

## 2018-07-16 ENCOUNTER — Encounter (HOSPITAL_BASED_OUTPATIENT_CLINIC_OR_DEPARTMENT_OTHER): Payer: BLUE CROSS/BLUE SHIELD | Admitting: Registered Nurse

## 2018-07-16 ENCOUNTER — Encounter: Payer: Self-pay | Admitting: Registered Nurse

## 2018-07-16 VITALS — BP 110/77 | HR 79 | Resp 14 | Ht 68.0 in | Wt 203.0 lb

## 2018-07-16 DIAGNOSIS — G609 Hereditary and idiopathic neuropathy, unspecified: Secondary | ICD-10-CM | POA: Diagnosis not present

## 2018-07-16 DIAGNOSIS — M797 Fibromyalgia: Secondary | ICD-10-CM | POA: Diagnosis not present

## 2018-07-16 DIAGNOSIS — Z5181 Encounter for therapeutic drug level monitoring: Secondary | ICD-10-CM | POA: Diagnosis not present

## 2018-07-16 DIAGNOSIS — G894 Chronic pain syndrome: Secondary | ICD-10-CM | POA: Diagnosis not present

## 2018-07-16 DIAGNOSIS — Z79891 Long term (current) use of opiate analgesic: Secondary | ICD-10-CM

## 2018-07-16 DIAGNOSIS — M6249 Contracture of muscle, multiple sites: Secondary | ICD-10-CM | POA: Diagnosis not present

## 2018-07-16 DIAGNOSIS — R569 Unspecified convulsions: Secondary | ICD-10-CM | POA: Diagnosis not present

## 2018-07-16 DIAGNOSIS — G8929 Other chronic pain: Secondary | ICD-10-CM | POA: Diagnosis not present

## 2018-07-16 DIAGNOSIS — M546 Pain in thoracic spine: Secondary | ICD-10-CM

## 2018-07-16 DIAGNOSIS — M545 Low back pain: Secondary | ICD-10-CM | POA: Diagnosis not present

## 2018-07-16 DIAGNOSIS — M329 Systemic lupus erythematosus, unspecified: Secondary | ICD-10-CM | POA: Diagnosis not present

## 2018-07-16 DIAGNOSIS — M62838 Other muscle spasm: Secondary | ICD-10-CM | POA: Diagnosis not present

## 2018-07-16 MED ORDER — OXYCODONE-ACETAMINOPHEN 7.5-325 MG PO TABS: 1.0000 | ORAL_TABLET | Freq: Every day | ORAL | 0 refills | Status: DC | PRN

## 2018-07-16 NOTE — Progress Notes (Signed)
Subjective:    Patient ID: Autumn Davis, female    DOB: Nov 14, 1986, 32 y.o.   MRN: [id number]  HPI: [name] is a 32 year old female who returns for follow up appointment for chronic pain and medication refill. She states her pain is located in her bilateral hands with tingling, bilateral rib pain and  Bilateral lower extremities with tingling and burning. She rates her pain 4. Her current exercise regime is walking.   [name] Morphine Equivalent is 56.25 MME. Last Oral Swab was Performed on 06/18/2018, it was consistent.    Pain Inventory Average Pain 8 Pain Right Now 4 My pain is intermittent, constant, sharp, burning, stabbing, tingling and aching  In the last 24 hours, has pain interfered with the following? General activity 6 Relation with others 6 Enjoyment of life 6 What TIME of day is your pain at its worst? evening, night Sleep (in general) Poor  Pain is worse with: walking, bending, sitting, inactivity, standing, unsure and some activites Pain improves with: rest, heat/ice, therapy/exercise, pacing activities, medication and TENS Relief from Meds: 4  Mobility walk without assistance walk with assistance use a cane ability to climb steps?  yes do you drive?  no Do you have any goals in this area?  no  Function I need assistance with the following:  meal prep, household duties and shopping Do you have any goals in this area?  yes  Neuro/Psych bladder control problems bowel control problems weakness numbness tremor tingling trouble walking spasms dizziness  Prior Studies Any changes since last visit?  no  Physicians involved in your care Any changes since last visit?  no   Family History  Problem Relation Age of Onset  . Thyroid disease Mother   . Colon polyps Father   . Lung cancer Maternal Grandmother   . Cancer Paternal Grandmother        colon/pancreatiec/lymphoma  . Irritable bowel syndrome Brother    Social History    Socioeconomic History  . Marital status: Married    Spouse name: Not on file  . Number of children: Not on file  . Years of education: Not on file  . Highest education level: Not on file  Occupational History  . Occupation: disabled    Employer: [employer]  Social Needs  . Financial resource strain: Not on file  . Food insecurity:    Worry: Not on file    Inability: Not on file  . Transportation needs:    Medical: Not on file    Non-medical: Not on file  Tobacco Use  . Smoking status: Never Smoker  . Smokeless tobacco: Never Used  Substance and Sexual Activity  . Alcohol use: No    Alcohol/week: 0.0 standard drinks  . Drug use: No  . Sexual activity: Not on file  Lifestyle  . Physical activity:    Days per week: Not on file    Minutes per session: Not on file  . Stress: Not on file  Relationships  . Social connections:    Talks on phone: Not on file    Gets together: Not on file    Attends religious service: Not on file    Active member of club or organization: Not on file    Attends meetings of clubs or organizations: Not on file    Relationship status: Not on file  Other Topics Concern  . Not on file  Social History Narrative  . Not on file   Past  Surgical History:  Procedure Laterality Date  . 24 HOUR PH STUDY N/A 10/29/2015   Procedure: 24 HOUR PH STUDY;  Surgeon: Iva Boop, MD;  Location: [facility];  Service: Endoscopy;  Laterality: N/A;  . COLONOSCOPY    . ESOPHAGEAL MANOMETRY N/A 10/29/2015   Procedure: ESOPHAGEAL MANOMETRY (EM);  Surgeon: Iva Boop, MD;  Location: [facility];  Service: Endoscopy;  Laterality: N/A;  . KNEE SURGERY  2002/2003   bil  . RECTAL SURGERY  correction of prolapse   2010   Past Medical History:  Diagnosis Date  . Allergy   . Arthritis    HANDS,HIPS,KNEES  . Bronchitis, chronic/intermittent 01/22/2012  . Depression 01/22/2012  . GERD (gastroesophageal reflux disease)   . Hyperlipidemia   . IBS  (irritable bowel syndrome) 01/22/2012  . Lupus (HCC)   . Neuromuscular disorder (HCC)   . Osteoporosis   . Seizures (HCC)   . SOB (shortness of breath)   . Thyroid disease    BP 110/77   Pulse 79   Resp 14   Ht 5\' 8"  (1.727 m)   Wt 203 lb (92.1 kg)   SpO2 97%   BMI 30.87 kg/m   Opioid Risk Score:   Fall Risk Score:  `1  Depression screen PHQ 2/9  Depression screen [facility] 2/9 05/13/2018 04/22/2018 02/12/2018 02/09/2018 11/20/2017 11/06/2017 05/14/2017  Decreased Interest 1 0 0 1 1 1  0  Down, Depressed, Hopeless 0 0 0 0 1 1 0  PHQ - 2 Score 1 0 0 1 2 2  0  Altered sleeping - - 0 - - 3 -  Tired, decreased energy - - 0 - - 2 -  Change in appetite - - 0 - - 2 -  Feeling bad or failure about yourself  - - 0 - - 0 -  Trouble concentrating - - 0 - - 1 -  Moving slowly or fidgety/restless - - 0 - - 0 -  Suicidal thoughts - - 0 - - 0 -  PHQ-9 Score - - 0 - - 10 -    Review of Systems  Constitutional: Negative.   HENT: Negative.   Eyes: Negative.   Respiratory: Negative.   Cardiovascular: Negative.   Gastrointestinal: Positive for constipation and diarrhea.  Endocrine: Negative.   Genitourinary: Positive for difficulty urinating, frequency and urgency.       Incontinence  Musculoskeletal: Positive for back pain.       Spasms  Skin: Negative.   Allergic/Immunologic: Negative.   Neurological: Positive for dizziness, tremors, weakness and numbness.       Tingling   Hematological: Negative.   Psychiatric/Behavioral: Negative.   All other systems reviewed and are negative.      Objective:   Physical Exam  Constitutional: She is oriented to person, place, and time. She appears well-developed and well-nourished.  HENT:  Head: Normocephalic and atraumatic.  Neck: Normal range of motion. Neck supple.  Cardiovascular: Normal rate and regular rhythm.  Pulmonary/Chest: Effort normal and breath sounds normal.  Musculoskeletal:  Normal Muscle Bulk and Muscle Testing Reveals: Upper  Extremities: Full ROM and Muscle Strength 5/5 Thoracic Paraspinal Tenderness: T-7-T-9 Lower Extremities: Full ROM and Muscle Strength 5/5 Arises from Table Slowly Narrow Based Gait  Neurological: She is alert and oriented to person, place, and time.  Skin: Skin is warm and dry.  Psychiatric: She has a normal mood and affect. Her behavior is normal.  Nursing note and vitals reviewed.  Assessment & Plan:  1. Chronic seizure disorder: No seizure's. Continuecurrent medication regimen withGabapentin. Neurology Following.07/16/2018 2. Chronic muscle spasms, weakness with associated pain disorder: Continuecurrent medication regimen withBaclofen. Continue with Exercise regime.07/16/2018 3. Chronic dysphagia: GI Following.07/16/2018 4. Anxiety with depression : Stable. Continuecurrent medication regimen withElavil.07/16/2018 5. Fibromyalgia/ Rib Pain/Chronic Pain:Continue Diclofenac: Continue with exercise and heat Therapy. 07/16/2018 Refilled: oxyCODONE 7.5/325mg  onetablet 5 times daily as needed #150. We will continue opioid monitoring program, this consists of regular clinic visits, examinations, urine drug screen, pill counts as well as use of [place] Controlled Substance Reporting System. 6. Peripheral Neuropathy:Continuecurrent medication regimen withGabapentin: 07/16/2018  20 minutes of face to face patient care time was spent during this visit. All questions were encouraged and answered.  F/U in 1 month

## 2018-08-13 ENCOUNTER — Ambulatory Visit (INDEPENDENT_AMBULATORY_CARE_PROVIDER_SITE_OTHER): Payer: BLUE CROSS/BLUE SHIELD | Admitting: Nurse Practitioner

## 2018-08-13 ENCOUNTER — Encounter: Payer: Self-pay | Admitting: Nurse Practitioner

## 2018-08-13 VITALS — BP 111/77 | HR 78 | Temp 98.7°F | Ht 68.0 in | Wt 200.8 lb

## 2018-08-13 DIAGNOSIS — D519 Vitamin B12 deficiency anemia, unspecified: Secondary | ICD-10-CM

## 2018-08-13 DIAGNOSIS — M797 Fibromyalgia: Secondary | ICD-10-CM

## 2018-08-13 DIAGNOSIS — R609 Edema, unspecified: Secondary | ICD-10-CM

## 2018-08-13 DIAGNOSIS — E782 Mixed hyperlipidemia: Secondary | ICD-10-CM

## 2018-08-13 DIAGNOSIS — M545 Low back pain, unspecified: Secondary | ICD-10-CM

## 2018-08-13 DIAGNOSIS — R350 Frequency of micturition: Secondary | ICD-10-CM

## 2018-08-13 DIAGNOSIS — K581 Irritable bowel syndrome with constipation: Secondary | ICD-10-CM

## 2018-08-13 DIAGNOSIS — R1310 Dysphagia, unspecified: Secondary | ICD-10-CM

## 2018-08-13 DIAGNOSIS — E079 Disorder of thyroid, unspecified: Secondary | ICD-10-CM

## 2018-08-13 DIAGNOSIS — M329 Systemic lupus erythematosus, unspecified: Secondary | ICD-10-CM

## 2018-08-13 DIAGNOSIS — F3342 Major depressive disorder, recurrent, in full remission: Secondary | ICD-10-CM | POA: Diagnosis not present

## 2018-08-13 DIAGNOSIS — R569 Unspecified convulsions: Secondary | ICD-10-CM

## 2018-08-13 DIAGNOSIS — E559 Vitamin D deficiency, unspecified: Secondary | ICD-10-CM | POA: Diagnosis not present

## 2018-08-13 DIAGNOSIS — K219 Gastro-esophageal reflux disease without esophagitis: Secondary | ICD-10-CM

## 2018-08-13 DIAGNOSIS — I479 Paroxysmal tachycardia, unspecified: Secondary | ICD-10-CM

## 2018-08-13 DIAGNOSIS — D8989 Other specified disorders involving the immune mechanism, not elsewhere classified: Secondary | ICD-10-CM

## 2018-08-13 MED ORDER — BACLOFEN 20 MG PO TABS: ORAL_TABLET | ORAL | 1 refills | Status: DC

## 2018-08-13 MED ORDER — ATORVASTATIN CALCIUM 40 MG PO TABS: ORAL_TABLET | ORAL | 1 refills | Status: DC

## 2018-08-13 MED ORDER — FUROSEMIDE 20 MG PO TABS: 20.0000 mg | ORAL_TABLET | Freq: Every day | ORAL | 1 refills | Status: DC

## 2018-08-13 MED ORDER — AMITRIPTYLINE HCL 75 MG PO TABS: 75.0000 mg | ORAL_TABLET | Freq: Every day | ORAL | 3 refills | Status: DC

## 2018-08-13 MED ORDER — DEXLANSOPRAZOLE 60 MG PO CPDR
DELAYED_RELEASE_CAPSULE | ORAL | 1 refills | Status: DC
Start: 2018-08-13 — End: 2019-07-22

## 2018-08-13 MED ORDER — METOPROLOL SUCCINATE ER 25 MG PO TB24: ORAL_TABLET | ORAL | 1 refills | Status: DC

## 2018-08-13 MED ORDER — ACETAZOLAMIDE 250 MG PO TABS: ORAL_TABLET | ORAL | 1 refills | Status: DC

## 2018-08-13 MED ORDER — TAMSULOSIN HCL 0.4 MG PO CAPS: 0.4000 mg | ORAL_CAPSULE | Freq: Every day | ORAL | 1 refills | Status: DC

## 2018-08-13 NOTE — Patient Instructions (Signed)
Myofascial Pain Syndrome and Fibromyalgia Myofascial pain syndrome and fibromyalgia are both pain disorders. This pain may be felt mainly in your muscles.  Myofascial pain syndrome: ? Always has trigger points or tender points in the muscle that will cause pain when pressed. The pain may come and go. ? Usually affects your neck, upper back, and shoulder areas. The pain often radiates into your arms and hands.  Fibromyalgia: ? Has muscle pains and tenderness that come and go. ? Is often associated with fatigue and sleep disturbances. ? Has trigger points. ? Tends to be long-lasting (chronic), but is not life-threatening.  Fibromyalgia and myofascial pain are not the same. However, they often occur together. If you have both conditions, each can make the other worse. Both are common and can cause enough pain and fatigue to make day-to-day activities difficult. What are the causes? The exact causes of fibromyalgia and myofascial pain are not known. People with certain gene types may be more likely to develop fibromyalgia. Some factors can be triggers for both conditions, such as:  Spine disorders.  Arthritis.  Severe injury (trauma) and other physical stressors.  Being under a lot of stress.  A medical illness.  What are the signs or symptoms? Fibromyalgia The main symptom of fibromyalgia is widespread pain and tenderness in your muscles. This can vary over time. Pain is sometimes described as stabbing, shooting, or burning. You may have tingling or numbness, too. You may also have sleep problems and fatigue. You may wake up feeling tired and groggy (fibro fog). Other symptoms may include:  Bowel and bladder problems.  Headaches.  Visual problems.  Problems with odors and noises.  Depression or mood changes.  Painful menstrual periods (dysmenorrhea).  Dry skin or eyes.  Myofascial pain syndrome Symptoms of myofascial pain syndrome include:  Tight, ropy bands of  muscle.  Uncomfortable sensations in muscular areas, such as: ? Aching. ? Cramping. ? Burning. ? Numbness. ? Tingling. ? Muscle weakness.  Trouble moving certain muscles freely (range of motion).  How is this diagnosed? There are no specific tests to diagnose fibromyalgia or myofascial pain syndrome. Both can be hard to diagnose because their symptoms are common in many other conditions. Your health care provider may suspect one or both of these conditions based on your symptoms and medical history. Your health care provider will also do a physical exam. The key to diagnosing fibromyalgia is having pain, fatigue, and other symptoms for more than three months that cannot be explained by another condition. The key to diagnosing myofascial pain syndrome is finding trigger points in muscles that are tender and cause pain elsewhere in your body (referred pain). How is this treated? Treating fibromyalgia and myofascial pain often requires a team of health care providers. This usually starts with your primary provider and a physical therapist. You may also find it helpful to work with alternative health care providers, such as massage therapists or acupuncturists. Treatment for fibromyalgia may include medicines. This may include nonsteroidal anti-inflammatory drugs (NSAIDs), along with other medicines. Treatment for myofascial pain may also include:  NSAIDs.  Cooling and stretching of muscles.  Trigger point injections.  Sound wave (ultrasound) treatments to stimulate muscles.  Follow these instructions at home:  Take medicines only as directed by your health care provider.  Exercise as directed by your health care provider or physical therapist.  Try to avoid stressful situations.  Practice relaxation techniques to control your stress. You may want to try: ? Biofeedback. ? Visual   imagery. ? Hypnosis. ? Muscle relaxation. ? Yoga. ? Meditation.  Talk to your health care provider  about alternative treatments, such as acupuncture or massage treatment.  Maintain a healthy lifestyle. This includes eating a healthy diet and getting enough sleep.  Consider joining a support group.  Do not do activities that stress or strain your muscles. That includes repetitive motions and heavy lifting. Where to find more information:  National Fibromyalgia Association: www.fmaware.org  Arthritis Foundation: www.arthritis.org  American Chronic Pain Association: www.theacpa.org/condition/myofascial-pain Contact a health care provider if:  You have new symptoms.  Your symptoms get worse.  You have side effects from your medicines.  You have trouble sleeping.  Your condition is causing depression or anxiety. This information is not intended to replace advice given to you by your health care provider. Make sure you discuss any questions you have with your health care provider. Document Released: 11/03/2005 Document Revised: 04/10/2016 Document Reviewed: 08/09/2014 Elsevier Interactive Patient Education  2018 Elsevier Inc.  

## 2018-08-13 NOTE — Addendum Note (Signed)
Addended by: [name] on: 08/13/2018 03:39 PM   Modules accepted: Orders

## 2018-08-13 NOTE — Progress Notes (Addendum)
Subjective:    Patient ID: Autumn Davis, female    DOB: 10-11-1986, 32 y.o.   MRN: [id number]   Chief Complaint: Medical Management of Chronic issues  HPI:  1. Gastroesophageal reflux disease without esophagitis  Is currently on dexilant which woks well along with watching diet.  2. Irritable bowel syndrome with constipation  She uses miralax to stay regular and seems to be helping. She does not have bowel movement everyday bit does not feel constipated as of late.  3. Dysphagia, unspecified type  Has not been having as much trouble with this lately. Sometimes feelslike difficult for pills to go down but doing well otherwise.  4. Thyroid disease  Will need  To check labs today. She is not having any problems with this that she is aware of.  5. Seizures (HCC)  No recent seizure activity  6. Recurrent major depressive disorder, in full remission [facility])  Currently not on antidepressant Depression screen [facility] 2/9 08/13/2018 05/13/2018 04/22/2018  Decreased Interest 0 1 0  Down, Depressed, Hopeless 0 0 0  PHQ - 2 Score 0 1 0  Altered sleeping - - -  Tired, decreased energy - - -  Change in appetite - - -  Feeling bad or failure about yourself  - - -  Trouble concentrating - - -  Moving slowly or fidgety/restless - - -  Suicidal thoughts - - -  PHQ-9 Score - - -     7. Tachycardia, paroxysmal (HCC)  denies any recent palpitations- metoprolol keeps her under control.  8. Vitamin D deficiency  Takes a daily vitamin d supplement  9. Back pain, lumbosacral  She is currently going to pain management for pain control . Rate [place] today 3/10.  10. Mixed hyperlipidemia  Has been on diet and has lost over 30lbs  11. Fibromyalgia  Has all  Over pain and as stated previously she goes to pain management for control.  12. Systemic lupus erythematosus, unspecified SLE type, unspecified organ involvement status (HCC) they still are not sure if she has lupus or not. They are leaning more towards  yes bt labs are not always definitive. She sees specialist every 3- 6 months. Has had no recent changes to plan of care.  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 Patient is maintaining right now.    Outpatient Encounter Medications as of 08/13/2018  Medication Sig  . acetaZOLAMIDE (DIAMOX) 250 MG tablet TAKE 1 TABLET EVERY MORNING, TAKE 1 TABLET AT 2PM, AND TAKE 2 TABLETSAT BEDTIME  . albuterol (PROVENTIL HFA;VENTOLIN HFA) 108 (90 BASE) MCG/ACT inhaler Inhale 2 puffs into the lungs every 6 (six) hours as needed for wheezing or shortness of breath.  Marland Kitchen amitriptyline (ELAVIL) 75 MG tablet Take 1 tablet (75 mg total) by mouth at bedtime.  Marland Kitchen atorvastatin (LIPITOR) 40 MG tablet TAKE ONE (1) TABLET EACH DAY  . baclofen (LIORESAL) 20 MG tablet TAKE ONE TABLET FOUR TIMES DAILY  . Beclomethasone Dipropionate (QNASL CHILDRENS) 40 MCG/ACT AERS Place into the nose.  . Calcium-Magnesium-Vitamin D (CALCIUM 1200+D3 PO) Take 1 tablet by mouth daily.  . cetirizine (ZYRTEC) 10 MG tablet Take 10 mg by mouth daily.  . Cholecalciferol (VITAMIN D3) 50000 units CAPS Take 50,000 Units by mouth once a week.  . cyanocobalamin (,VITAMIN B-12,) 1000 MCG/ML injection 1ml im QW for 4 weeks then QOW for 4 weeks then  monthly  . cycloSPORINE (RESTASIS) 0.05 % ophthalmic emulsion Place 1 drop into both eyes 2 (two) times daily.  Marland Kitchen dexlansoprazole (DEXILANT) 60 MG capsule TAKE ONE (1) CAPSULE EACH DAY  . diclofenac (VOLTAREN) 75 MG EC tablet TAKE ONE TABLET BY MOUTH TWICE DAILY  . docusate sodium (COLACE) 100 MG capsule Take 100 mg by mouth 2 (two) times daily. PRN  . EPINEPHRINE 0.3 mg/0.3 mL IJ SOAJ injection USE AS DIRECTED  . FORTEO 600 MCG/2.4ML SOLN Inject 20 mcg into the skin at bedtime.  . furosemide (LASIX) 20 MG tablet Take 1 tablet (20 mg total) by mouth daily.  Marland Kitchen gabapentin (NEURONTIN) 600 MG tablet  TAKE ONE TABLET FIVE TIMES DAILY  . ipratropium-albuterol (DUONEB) 0.5-2.5 (3) MG/3ML SOLN Take 3 mLs by nebulization every 4 (four) hours as needed.  Marland Kitchen L-Methylfolate-Algae (L-METHYLFOLATE FORTE) 15-90.314 MG CAPS TAKE ONE (1) CAPSULE EACH DAY  . medroxyPROGESTERone (DEPO-PROVERA) 150 MG/ML injection Inject 150 mg into the muscle as directed. Every 2 months  . metoprolol succinate (TOPROL-XL) 25 MG 24 hr tablet TAKE ONE (1) TABLET EACH DAY  . Miconazole 50 MG TABS Place 1 tablet (50 mg total) inside cheek daily.  . montelukast (SINGULAIR) 10 MG tablet TAKE ONE TABLET DAILY AT BEDTIME  . mupirocin ointment (BACTROBAN) 2 % Apply 1 application topically 2 (two) times daily.  Marland Kitchen nystatin (MYCOSTATIN) 100000 UNIT/ML suspension   . nystatin-triamcinolone ointment (MYCOLOG) Apply 1 application topically 2 (two) times daily.  . Omega 3 1000 MG CAPS Take 1 capsule by mouth daily.  Marland Kitchen oxyCODONE-acetaminophen (PERCOCET) 7.5-325 MG tablet Take 1 tablet by mouth 5 (five) times daily as needed for severe pain. No More Than 5 a day  . predniSONE (DELTASONE) 5 MG tablet Take 1 tablet (5 mg total) by mouth every morning.  . promethazine (PHENERGAN) 12.5 MG tablet Take 1 tablet (12.5 mg total) by mouth every 8 (eight) hours as needed for nausea or vomiting.  Marland Kitchen scopolamine (TRANSDERM-SCOP, 1.5 MG,) 1 MG/3DAYS Place 1 patch (1.5 mg total) onto the skin every 3 (three) days.  . tamsulosin (FLOMAX) 0.4 MG CAPS capsule TAKE ONE (1) CAPSULE EACH DAY  . vitamin E (VITAMIN E) 400 UNIT capsule Take 400 Units by mouth daily.     New complaints: Had anaphylaxis reaction to something last night and had to use her epipen. Not sure what flared it up.  Social history: Lives with husband and they have custody of a 87 year old little boy.   Review of Systems  Constitutional: Positive for fatigue.  HENT: Positive for congestion.   Respiratory: Negative for cough and shortness of breath.   Cardiovascular: Negative.     Gastrointestinal: Negative.   Genitourinary: Negative.   Musculoskeletal: Positive for arthralgias and myalgias.  Neurological: Negative.   Psychiatric/Behavioral: Negative.   All other systems reviewed and are negative.      Objective:   Physical Exam  Constitutional: She is oriented to person, place, and time. She appears well-developed and well-nourished. No distress.  HENT:  Head: Normocephalic.  Nose: Nose normal.  Mouth/Throat: Oropharynx is clear and moist.  Eyes: Pupils are equal, round, and reactive to light. EOM are normal.  Neck: Normal range of motion. Neck supple. No JVD present. Carotid bruit is not present.  Cardiovascular: Normal rate, regular rhythm, normal heart sounds and intact distal pulses.  Pulmonary/Chest: Effort normal and breath sounds normal. No respiratory distress. She has no wheezes. She has no rales. She exhibits no tenderness.  Course breath  sounds  Abdominal: Soft. Normal appearance, normal aorta and bowel sounds are normal. She exhibits no distension, no abdominal bruit, no pulsatile midline mass and no mass. There is no splenomegaly or hepatomegaly. There is no tenderness.  Musculoskeletal: Normal range of motion. She exhibits no edema.  Lymphadenopathy:    She has no cervical adenopathy.  Neurological: She is alert and oriented to person, place, and time. She has normal reflexes.  Skin: Skin is warm and dry.  Psychiatric: She has a normal mood and affect. Her behavior is normal. Judgment and thought content normal.  Nursing note and vitals reviewed.  BP 111/77   Pulse 78   Temp 98.7 F (37.1 C) (Oral)   Ht 5\' 8"  (1.727 m)   Wt 200 lb 12.8 oz (91.1 kg)   BMI 30.53 kg/m         Assessment & Plan:  Autumn Davis comes in today with chief complaint of Medical Management of Chronic Issues   Diagnosis and orders addressed:  1. Gastroesophageal reflux disease without esophagitis Avoid spicy foods Do not eat 2 hours prior to bedtime -  dexlansoprazole (DEXILANT) 60 MG capsule; TAKE ONE (1) CAPSULE EACH DAY  Dispense: 90 capsule; Refill: 1  2. Irritable bowel syndrome with constipation Watch diet and avid foods that may be causing issues - baclofen (LIORESAL) 20 MG tablet; TAKE ONE TABLET FOUR TIMES DAILY  Dispense: 360 each; Refill: 1 - amitriptyline (ELAVIL) 75 MG tablet; Take 1 tablet (75 mg total) by mouth at bedtime.  Dispense: 90 tablet; Refill: 3  3. Dysphagia, unspecified type Chew food well  4. Thyroid disease   5. Seizures (HCC) - acetaZOLAMIDE (DIAMOX) 250 MG tablet; TAKE 1 TABLET EVERY MORNING, TAKE 1 TABLET AT 2PM, AND TAKE 2 TABLETSAT BEDTIME  Dispense: 360 tablet; Refill: 1  6. Recurrent major depressive disorder, in full remission [facility]) Stress management  7. Tachycardia, paroxysmal (HCC) Avoid caffeine - metoprolol succinate (TOPROL-XL) 25 MG 24 hr tablet; TAKE ONE (1) TABLET EACH DAY  Dispense: 90 tablet; Refill: 1  8. Vitamin D deficiency Continue vitamind supplements  9. Back pain, lumbosacral  10. Mixed hyperlipidemia Low fat diet - atorvastatin (LIPITOR) 40 MG tablet; TAKE ONE (1) TABLET EACH DAY  Dispense: 90 tablet; Refill: 1  11. Fibromyalgia Moist heat exercise  12. 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 Peripheral edema Elevate legs when sitting - furosemide (LASIX) 20 MG tablet; Take 1 tablet (20 mg total) by mouth daily.  Dispense: 90 tablet; Refill: 1  15. Urinary frequency - tamsulosin (FLOMAX) 0.4 MG CAPS capsule; Take 1 capsule (0.4 mg total) by mouth daily.  Dispense: 90 capsule; Refill: 1  Orders Placed This Encounter  Procedures  . CMP14+EGFR  . Lipid panel  . Thyroid Panel With TSH  . CBC with Differential/Platelet  . Vitamin B12    Labs pending Health Maintenance reviewed Diet and exercise encouraged  Follow up plan: 3 months   Mary-Margaret  Daphine Deutscher, FNP

## 2018-08-14 LAB — CBC WITH DIFFERENTIAL/PLATELET
BASOS ABS: 0.1 10*3/uL (ref 0.0–0.2)
Basos: 1 %
EOS (ABSOLUTE): 0 10*3/uL (ref 0.0–0.4)
Eos: 0 %
HEMOGLOBIN: 12.4 g/dL (ref 11.1–15.9)
Hematocrit: 38.7 % (ref 34.0–46.6)
Immature Grans (Abs): 0 10*3/uL (ref 0.0–0.1)
Immature Granulocytes: 0 %
LYMPHS: 17 %
Lymphocytes Absolute: 1.6 10*3/uL (ref 0.7–3.1)
MCH: 29.2 pg (ref 26.6–33.0)
MCHC: 32 g/dL (ref 31.5–35.7)
MCV: 91 fL (ref 79–97)
MONOCYTES: 4 %
Monocytes Absolute: 0.4 10*3/uL (ref 0.1–0.9)
NEUTROS ABS: 7.1 10*3/uL — AB (ref 1.4–7.0)
Neutrophils: 78 %
Platelets: 292 10*3/uL (ref 150–450)
RBC: 4.25 x10E6/uL (ref 3.77–5.28)
RDW: 14 % (ref 12.3–15.4)
WBC: 9.1 10*3/uL (ref 3.4–10.8)

## 2018-08-14 LAB — VITAMIN B12: Vitamin B-12: 1526 pg/mL — ABNORMAL HIGH (ref 232–1245)

## 2018-08-14 LAB — LIPID PANEL
CHOL/HDL RATIO: 3.4 ratio (ref 0.0–4.4)
Cholesterol, Total: 177 mg/dL (ref 100–199)
HDL: 52 mg/dL (ref >39)
LDL CALC: 86 mg/dL (ref 0–99)
TRIGLYCERIDES: 193 mg/dL — AB (ref 0–149)
VLDL Cholesterol Cal: 39 mg/dL (ref 5–40)

## 2018-08-14 LAB — CMP14+EGFR
ALT: 21 IU/L (ref 0–32)
AST: 20 IU/L (ref 0–40)
Albumin/Globulin Ratio: 1.9 (ref 1.2–2.2)
Albumin: 4.3 g/dL (ref 3.5–5.5)
Alkaline Phosphatase: 97 IU/L (ref 39–117)
BUN/Creatinine Ratio: 12 (ref 9–23)
BUN: 10 mg/dL (ref 6–20)
Bilirubin Total: 0.3 mg/dL (ref 0.0–1.2)
CALCIUM: 9.4 mg/dL (ref 8.7–10.2)
CO2: 19 mmol/L — AB (ref 20–29)
CREATININE: 0.81 mg/dL (ref 0.57–1.00)
Chloride: 111 mmol/L — ABNORMAL HIGH (ref 96–106)
GFR calc Af Amer: 110 mL/min/{1.73_m2} (ref >59)
GFR, EST NON AFRICAN AMERICAN: 95 mL/min/{1.73_m2} (ref >59)
GLUCOSE: 107 mg/dL — AB (ref 65–99)
Globulin, Total: 2.3 g/dL (ref 1.5–4.5)
POTASSIUM: 3.9 mmol/L (ref 3.5–5.2)
Sodium: 147 mmol/L — ABNORMAL HIGH (ref 134–144)
Total Protein: 6.6 g/dL (ref 6.0–8.5)

## 2018-08-14 LAB — THYROID PANEL WITH TSH
FREE THYROXINE INDEX: 1.5 (ref 1.2–4.9)
T3 Uptake Ratio: 26 % (ref 24–39)
T4, Total: 5.9 ug/dL (ref 4.5–12.0)
TSH: 1.42 u[IU]/mL (ref 0.450–4.500)

## 2018-08-18 ENCOUNTER — Other Ambulatory Visit: Payer: Self-pay | Admitting: Nurse Practitioner

## 2018-08-19 ENCOUNTER — Encounter: Payer: Self-pay | Admitting: Registered Nurse

## 2018-08-19 ENCOUNTER — Encounter: Payer: Managed Care, Other (non HMO) | Attending: Physical Medicine & Rehabilitation | Admitting: Registered Nurse

## 2018-08-19 VITALS — BP 123/85 | HR 74 | Wt 201.0 lb

## 2018-08-19 DIAGNOSIS — M545 Low back pain: Secondary | ICD-10-CM | POA: Diagnosis present

## 2018-08-19 DIAGNOSIS — G8929 Other chronic pain: Secondary | ICD-10-CM

## 2018-08-19 DIAGNOSIS — R569 Unspecified convulsions: Secondary | ICD-10-CM | POA: Diagnosis present

## 2018-08-19 DIAGNOSIS — M546 Pain in thoracic spine: Secondary | ICD-10-CM | POA: Diagnosis not present

## 2018-08-19 DIAGNOSIS — M6249 Contracture of muscle, multiple sites: Secondary | ICD-10-CM | POA: Insufficient documentation

## 2018-08-19 DIAGNOSIS — G609 Hereditary and idiopathic neuropathy, unspecified: Secondary | ICD-10-CM

## 2018-08-19 DIAGNOSIS — M62838 Other muscle spasm: Secondary | ICD-10-CM | POA: Diagnosis not present

## 2018-08-19 DIAGNOSIS — G894 Chronic pain syndrome: Secondary | ICD-10-CM

## 2018-08-19 DIAGNOSIS — M797 Fibromyalgia: Secondary | ICD-10-CM | POA: Diagnosis present

## 2018-08-19 DIAGNOSIS — Z5181 Encounter for therapeutic drug level monitoring: Secondary | ICD-10-CM

## 2018-08-19 DIAGNOSIS — D8989 Other specified disorders involving the immune mechanism, not elsewhere classified: Secondary | ICD-10-CM

## 2018-08-19 DIAGNOSIS — Z79891 Long term (current) use of opiate analgesic: Secondary | ICD-10-CM

## 2018-08-19 DIAGNOSIS — M329 Systemic lupus erythematosus, unspecified: Secondary | ICD-10-CM | POA: Insufficient documentation

## 2018-08-19 NOTE — Progress Notes (Signed)
Subjective:    Patient ID: Autumn Davis, female    DOB: Aug 23, 1986, 32 y.o.   MRN: [id number]  HPI: [name] is a 32 year old female who returns for follow up appointment for chronic pain and medication refill. She states her pain is located in her bilateral ribs and mid back pain radiating upward she describes it as burning painand bilateral feet pain with tingling and burning. . She rates her pain 4. Her current exercise regime is walking.   [name] Morphine Equivalent is 56.25 MME. Last Oral Swab was Performed on 06/18/2018, it was consistent.   Pain Inventory Average Pain 8 Pain Right Now 4 My pain is constant, sharp, burning, tingling and aching  In the last 24 hours, has pain interfered with the following? General activity 8 Relation with others 8 Enjoyment of life 6 What TIME of day is your pain at its worst? night Sleep (in general) Poor  Pain is worse with: walking, bending, sitting, inactivity, standing and some activites Pain improves with: rest, heat/ice, therapy/exercise, pacing activities, medication and TENS Relief from Meds: 4  Mobility walk without assistance ability to climb steps?  yes do you drive?  no  Function I need assistance with the following:  meal prep, household duties and shopping  Neuro/Psych bladder control problems bowel control problems weakness numbness tremor tingling trouble walking spasms dizziness  Prior Studies Any changes since last visit?  no  Physicians involved in your care Any changes since last visit?  no   Family History  Problem Relation Age of Onset  . Thyroid disease Mother   . Colon polyps Father   . Lung cancer Maternal Grandmother   . Cancer Paternal Grandmother        colon/pancreatiec/lymphoma  . Irritable bowel syndrome Brother    Social History   Socioeconomic History  . Marital status: Married    Spouse name: Not on file  . Number of children: Not on file  . Years of education: Not on  file  . Highest education level: Not on file  Occupational History  . Occupation: disabled    Employer: [employer]  Social Needs  . Financial resource strain: Not on file  . Food insecurity:    Worry: Not on file    Inability: Not on file  . Transportation needs:    Medical: Not on file    Non-medical: Not on file  Tobacco Use  . Smoking status: Never Smoker  . Smokeless tobacco: Never Used  Substance and Sexual Activity  . Alcohol use: No    Alcohol/week: 0.0 standard drinks  . Drug use: No  . Sexual activity: Not on file  Lifestyle  . Physical activity:    Days per week: Not on file    Minutes per session: Not on file  . Stress: Not on file  Relationships  . Social connections:    Talks on phone: Not on file    Gets together: Not on file    Attends religious service: Not on file    Active member of club or organization: Not on file    Attends meetings of clubs or organizations: Not on file    Relationship status: Not on file  Other Topics Concern  . Not on file  Social History Narrative  . Not on file   Past Surgical History:  Procedure Laterality Date  . 24 HOUR PH STUDY N/A 10/29/2015   Procedure: 24 HOUR PH STUDY;  Surgeon: Maryjean Morn  Leone Payor, MD;  Location: [facility];  Service: Endoscopy;  Laterality: N/A;  . COLONOSCOPY    . ESOPHAGEAL MANOMETRY N/A 10/29/2015   Procedure: ESOPHAGEAL MANOMETRY (EM);  Surgeon: Iva Boop, MD;  Location: [facility];  Service: Endoscopy;  Laterality: N/A;  . KNEE SURGERY  2002/2003   bil  . RECTAL SURGERY  correction of prolapse   2010   Past Medical History:  Diagnosis Date  . Allergy   . Arthritis    HANDS,HIPS,KNEES  . Bronchitis, chronic/intermittent 01/22/2012  . Depression 01/22/2012  . GERD (gastroesophageal reflux disease)   . Hyperlipidemia   . IBS (irritable bowel syndrome) 01/22/2012  . Lupus (HCC)   . Neuromuscular disorder (HCC)   . Osteoporosis   . Seizures (HCC)   . SOB (shortness of  breath)   . Thyroid disease    BP 123/85 (BP Location: Left Arm, Patient Position: Sitting, Cuff Size: Normal)   Pulse 74   SpO2 97%   Opioid Risk Score:   Fall Risk Score:  `1  Depression screen PHQ 2/9  Depression screen [facility] 2/9 08/13/2018 05/13/2018 04/22/2018 02/12/2018 02/09/2018 11/20/2017 11/06/2017  Decreased Interest 0 1 0 0 1 1 1   Down, Depressed, Hopeless 0 0 0 0 0 1 1  PHQ - 2 Score 0 1 0 0 1 2 2   Altered sleeping - - - 0 - - 3  Tired, decreased energy - - - 0 - - 2  Change in appetite - - - 0 - - 2  Feeling bad or failure about yourself  - - - 0 - - 0  Trouble concentrating - - - 0 - - 1  Moving slowly or fidgety/restless - - - 0 - - 0  Suicidal thoughts - - - 0 - - 0  PHQ-9 Score - - - 0 - - 10     Review of Systems  Constitutional: Negative.   HENT: Negative.   Eyes: Negative.   Respiratory: Negative.   Cardiovascular: Negative.   Gastrointestinal: Negative.   Endocrine: Negative.   Genitourinary: Positive for difficulty urinating.  Musculoskeletal: Positive for arthralgias, back pain, gait problem and myalgias.  Skin: Negative.   Allergic/Immunologic: Negative.   Neurological: Positive for dizziness, tremors, weakness and numbness.  Hematological: Negative.   Psychiatric/Behavioral: Negative.   All other systems reviewed and are negative.      Objective:   Physical Exam  Constitutional: She is oriented to person, place, and time. She appears well-developed and well-nourished.  HENT:  Head: Normocephalic and atraumatic.  Neck: Normal range of motion. Neck supple.  Cardiovascular: Normal rate and regular rhythm.  Pulmonary/Chest: Effort normal and breath sounds normal.  Musculoskeletal:  Normal Muscle Bulk and Muscle Testing Reveals: Upper Extremities: Full ROM an Muscle Strength 5/5 Thoracic Paraspinal Tenderness: T-7- T-9 Lower Extremities: Full ROM and Muscle Strength 5/5 Arises from Table with Ease Narrow Based Gait  Neurological: She is alert  and oriented to person, place, and time.  Skin: Skin is warm and dry.  Psychiatric: She has a normal mood and affect. Her behavior is normal.  Nursing note and vitals reviewed.         Assessment & Plan:  1. Chronic seizure disorder: No seizure's. Continuecurrent medication regimen withGabapentin. Neurology Following.08/19/2018 2. Chronic muscle spasms, weakness with associated pain disorder: Continuecurrent medication regimen withBaclofen. Continue with Exercise regime.08/19/2018 3. Chronic dysphagia: GI Following.08/19/2018 4. Anxiety with depression : Stable. Continuecurrent medication regimen withElavil.08/19/2018 5. Fibromyalgia/ Rib Pain/Chronic Pain:Continue Diclofenac: Continue with exercise  and heat Therapy. 08/19/2018 Refilled: oxyCODONE 7.5/325mg  onetablet 5 times daily as needed #150. We will continue opioid monitoring program, this consists of regular clinic visits, examinations, urine drug screen, pill counts as well as use of [place] Controlled Substance Reporting System. 6. Peripheral Neuropathy:Continuecurrent medication regimen withGabapentin: 08/19/2018  20 minutes of face to face patient care time was spent during this visit. All questions were encouraged and answered.  F/U in 1 month

## 2018-08-24 ENCOUNTER — Other Ambulatory Visit: Payer: Self-pay | Admitting: Nurse Practitioner

## 2018-08-24 MED ORDER — CEPHALEXIN 500 MG PO CAPS: 500.0000 mg | ORAL_CAPSULE | Freq: Three times a day (TID) | ORAL | 0 refills | Status: DC

## 2018-09-02 ENCOUNTER — Telehealth: Payer: Self-pay

## 2018-09-02 NOTE — Telephone Encounter (Signed)
Insurance denied prior auth for Dexilant   This is excluded from coverage   Does not cover this med at all.

## 2018-09-13 ENCOUNTER — Other Ambulatory Visit: Payer: Self-pay | Admitting: Nurse Practitioner

## 2018-09-13 MED ORDER — L-METHYLFOLATE 15 MG PO TABS: 15.0000 mg | ORAL_TABLET | Freq: Every day | ORAL | 1 refills | Status: AC

## 2018-09-15 ENCOUNTER — Other Ambulatory Visit: Payer: Self-pay | Admitting: Nurse Practitioner

## 2018-09-15 ENCOUNTER — Telehealth: Payer: Self-pay

## 2018-09-15 DIAGNOSIS — M3219 Other organ or system involvement in systemic lupus erythematosus: Secondary | ICD-10-CM

## 2018-09-15 NOTE — Telephone Encounter (Signed)
Insurance denied prior auth for [employer] does not cover this medication no matter what the reason it is being requested

## 2018-09-16 NOTE — Telephone Encounter (Signed)
Please let patient know and se what she wants to do

## 2018-09-17 ENCOUNTER — Other Ambulatory Visit: Payer: Self-pay

## 2018-09-17 DIAGNOSIS — M62838 Other muscle spasm: Secondary | ICD-10-CM

## 2018-09-17 DIAGNOSIS — M797 Fibromyalgia: Secondary | ICD-10-CM

## 2018-09-17 DIAGNOSIS — R569 Unspecified convulsions: Secondary | ICD-10-CM

## 2018-09-17 MED ORDER — OXYCODONE-ACETAMINOPHEN 7.5-325 MG PO TABS: 1.0000 | ORAL_TABLET | Freq: Every day | ORAL | 0 refills | Status: DC | PRN

## 2018-09-17 NOTE — Telephone Encounter (Signed)
[name] has a new insurance, one week prescription sent. Second prescription will be e-scribe for the following weeks. PMP Aware site reviewed, last prescription was filled on 08/13/2018.

## 2018-09-21 ENCOUNTER — Ambulatory Visit (INDEPENDENT_AMBULATORY_CARE_PROVIDER_SITE_OTHER): Payer: Medicare Other

## 2018-09-21 DIAGNOSIS — Z23 Encounter for immunization: Secondary | ICD-10-CM | POA: Diagnosis not present

## 2018-09-22 ENCOUNTER — Ambulatory Visit: Payer: BLUE CROSS/BLUE SHIELD

## 2018-09-24 ENCOUNTER — Encounter: Payer: Self-pay | Admitting: Registered Nurse

## 2018-09-24 ENCOUNTER — Encounter: Payer: Managed Care, Other (non HMO) | Attending: Physical Medicine & Rehabilitation | Admitting: Registered Nurse

## 2018-09-24 VITALS — BP 110/75 | HR 78 | Ht 67.0 in | Wt 201.2 lb

## 2018-09-24 DIAGNOSIS — M62838 Other muscle spasm: Secondary | ICD-10-CM

## 2018-09-24 DIAGNOSIS — M545 Low back pain: Secondary | ICD-10-CM | POA: Diagnosis present

## 2018-09-24 DIAGNOSIS — M797 Fibromyalgia: Secondary | ICD-10-CM | POA: Diagnosis present

## 2018-09-24 DIAGNOSIS — R569 Unspecified convulsions: Secondary | ICD-10-CM | POA: Diagnosis not present

## 2018-09-24 DIAGNOSIS — M6249 Contracture of muscle, multiple sites: Secondary | ICD-10-CM | POA: Diagnosis not present

## 2018-09-24 DIAGNOSIS — Z5181 Encounter for therapeutic drug level monitoring: Secondary | ICD-10-CM

## 2018-09-24 DIAGNOSIS — G609 Hereditary and idiopathic neuropathy, unspecified: Secondary | ICD-10-CM

## 2018-09-24 DIAGNOSIS — M546 Pain in thoracic spine: Secondary | ICD-10-CM

## 2018-09-24 DIAGNOSIS — Z79891 Long term (current) use of opiate analgesic: Secondary | ICD-10-CM

## 2018-09-24 DIAGNOSIS — G8929 Other chronic pain: Secondary | ICD-10-CM

## 2018-09-24 DIAGNOSIS — M329 Systemic lupus erythematosus, unspecified: Secondary | ICD-10-CM | POA: Diagnosis present

## 2018-09-24 DIAGNOSIS — G894 Chronic pain syndrome: Secondary | ICD-10-CM

## 2018-09-24 DIAGNOSIS — D8989 Other specified disorders involving the immune mechanism, not elsewhere classified: Secondary | ICD-10-CM

## 2018-09-24 NOTE — Progress Notes (Signed)
Subjective:    Patient ID: Autumn Davis, female    DOB: [birth year], 32 y.o.   MRN: [id number]  HPI: [name] is a 32 year old female who returns for follow up appointment for chronic pain and medication refill. She states her pain is located in her ribs and lower extremities with tingling and burning pain L>R. She rates her pain 4. Her current exercise regime is walking and performing stretching exercises.   [name] Morphine Equivalent is 56.25 MME. Last Oral Swab was Performed on 06/18/2018, it was consistent.   Pain Inventory Average Pain 8 Pain Right Now 4 My pain is constant, tingling and aching  In the last 24 hours, has pain interfered with the following? General activity 7 Relation with others 8 Enjoyment of life 3 What TIME of day is your pain at its worst? evening Sleep (in general) Poor  Pain is worse with: walking, bending, sitting, inactivity, standing and some activites Pain improves with: rest, heat/ice, therapy/exercise, pacing activities, medication and TENS Relief from Meds: 5  Mobility ability to climb steps?  yes do you drive?  no  Function I need assistance with the following:  meal prep, household duties and shopping  Neuro/Psych bladder control problems bowel control problems weakness numbness tremor tingling trouble walking spasms dizziness  Prior Studies Any changes since last visit?  no  Physicians involved in your care Any changes since last visit?  no   Family History  Problem Relation Age of Onset  . Thyroid disease Mother   . Colon polyps Father   . Lung cancer Maternal Grandmother   . Cancer Paternal Grandmother        colon/pancreatiec/lymphoma  . Irritable bowel syndrome Brother    Social History   Socioeconomic History  . Marital status: Married    Spouse name: Not on file  . Number of children: Not on file  . Years of education: Not on file  . Highest education level: Not on file  Occupational History  .  Occupation: disabled    Employer: [employer]  Social Needs  . Financial resource strain: Not on file  . Food insecurity:    Worry: Not on file    Inability: Not on file  . Transportation needs:    Medical: Not on file    Non-medical: Not on file  Tobacco Use  . Smoking status: Never Smoker  . Smokeless tobacco: Never Used  Substance and Sexual Activity  . Alcohol use: No    Alcohol/week: 0.0 standard drinks  . Drug use: No  . Sexual activity: Not on file  Lifestyle  . Physical activity:    Days per week: Not on file    Minutes per session: Not on file  . Stress: Not on file  Relationships  . Social connections:    Talks on phone: Not on file    Gets together: Not on file    Attends religious service: Not on file    Active member of club or organization: Not on file    Attends meetings of clubs or organizations: Not on file    Relationship status: Not on file  Other Topics Concern  . Not on file  Social History Narrative  . Not on file   Past Surgical History:  Procedure Laterality Date  . 24 HOUR PH STUDY N/A 10/29/2015   Procedure: 24 HOUR PH STUDY;  Surgeon: Iva Boop, MD;  Location: [facility];  Service: Endoscopy;  Laterality: N/A;  .  COLONOSCOPY    . ESOPHAGEAL MANOMETRY N/A 10/29/2015   Procedure: ESOPHAGEAL MANOMETRY (EM);  Surgeon: Iva Boop, MD;  Location: [facility];  Service: Endoscopy;  Laterality: N/A;  . KNEE SURGERY  2002/2003   bil  . RECTAL SURGERY  correction of prolapse   2010   Past Medical History:  Diagnosis Date  . Allergy   . Arthritis    HANDS,HIPS,KNEES  . Bronchitis, chronic/intermittent 01/22/2012  . Depression 01/22/2012  . GERD (gastroesophageal reflux disease)   . Hyperlipidemia   . IBS (irritable bowel syndrome) 01/22/2012  . Lupus (HCC)   . Neuromuscular disorder (HCC)   . Osteoporosis   . Seizures (HCC)   . SOB (shortness of breath)   . Thyroid disease    BP 110/75   Pulse 78   Ht 5\' 7"  (1.702  m)   Wt 201 lb 3.2 oz (91.3 kg)   SpO2 98%   BMI 31.51 kg/m   Opioid Risk Score:   Fall Risk Score:  `1  Depression screen PHQ 2/9  Depression screen [facility] 2/9 08/13/2018 05/13/2018 04/22/2018 02/12/2018 02/09/2018 11/20/2017 11/06/2017  Decreased Interest 0 1 0 0 1 1 1   Down, Depressed, Hopeless 0 0 0 0 0 1 1  PHQ - 2 Score 0 1 0 0 1 2 2   Altered sleeping - - - 0 - - 3  Tired, decreased energy - - - 0 - - 2  Change in appetite - - - 0 - - 2  Feeling bad or failure about yourself  - - - 0 - - 0  Trouble concentrating - - - 0 - - 1  Moving slowly or fidgety/restless - - - 0 - - 0  Suicidal thoughts - - - 0 - - 0  PHQ-9 Score - - - 0 - - 10     Review of Systems  Constitutional: Negative.   HENT: Negative.   Eyes: Negative.   Respiratory: Negative.   Cardiovascular: Negative.   Gastrointestinal: Negative.   Endocrine: Negative.   Genitourinary: Positive for difficulty urinating.  Musculoskeletal: Positive for arthralgias, gait problem and myalgias.  Skin: Negative.   Allergic/Immunologic: Negative.   Neurological: Positive for dizziness, tremors, weakness and numbness.  Hematological: Negative.   Psychiatric/Behavioral: Negative.   All other systems reviewed and are negative.      Objective:   Physical Exam  Constitutional: She is oriented to person, place, and time. She appears well-developed and well-nourished.  HENT:  Head: Normocephalic and atraumatic.  Neck: Normal range of motion. Neck supple.  Cardiovascular: Normal rate and regular rhythm.  Pulmonary/Chest: Effort normal and breath sounds normal.  Musculoskeletal:  Normal Muscle Bulk and Muscle Testing Reveals: Upper Extremities: Full ROM and Muscle Strength 5/5 Thoracic Paraspinal Tenderness: T-7-T-9  Lower Extremities: Full ROM and Muscle Strength 5/5 Arises from Table with ease Narrow Based gait  Neurological: She is alert and oriented to person, place, and time.  Skin: Skin is warm and dry.  Psychiatric:  She has a normal mood and affect. Her behavior is normal.  Nursing note and vitals reviewed.         Assessment & Plan:  1. Chronic seizure disorder: No seizure's. Continuecurrent medication regimen withGabapentin. Neurology Following.09/24/2018 2. Chronic muscle spasms, weakness with associated pain disorder: Continuecurrent medication regimen withBaclofen. Continue with Exercise regime.09/24/2018 3. Chronic dysphagia: GI Following.09/24/2018 4. Anxiety with depression : Stable. Continuecurrent medication regimen withElavil.09/24/2018 5. Fibromyalgia/ Rib Pain/Chronic Pain:Continue Diclofenac: Continue with exercise and heat Therapy. 09/24/2018 Continue:  oxyCODONE 7.5/325mg  onetablet 5 times daily as needed #150. We will continue opioid monitoring program, this consists of regular clinic visits, examinations, urine drug screen, pill counts as well as use of [place] Controlled Substance Reporting System. 6. Peripheral Neuropathy:Continuecurrent medication regimen withGabapentin: 09/24/2018  20 minutes of face to face patient care time was spent during this visit. All questions were encouraged and answered.  F/U in 1 month

## 2018-09-27 NOTE — Telephone Encounter (Signed)
No response from patient. Note filed. 

## 2018-10-03 ENCOUNTER — Telehealth: Payer: Managed Care, Other (non HMO) | Admitting: Physician Assistant

## 2018-10-03 DIAGNOSIS — J208 Acute bronchitis due to other specified organisms: Secondary | ICD-10-CM

## 2018-10-03 DIAGNOSIS — B9689 Other specified bacterial agents as the cause of diseases classified elsewhere: Secondary | ICD-10-CM

## 2018-10-03 MED ORDER — DOXYCYCLINE HYCLATE 100 MG PO CAPS: 100.0000 mg | ORAL_CAPSULE | Freq: Two times a day (BID) | ORAL | 0 refills | Status: DC

## 2018-10-03 MED ORDER — BENZONATATE 100 MG PO CAPS: 100.0000 mg | ORAL_CAPSULE | Freq: Three times a day (TID) | ORAL | 0 refills | Status: DC | PRN

## 2018-10-03 NOTE — Progress Notes (Signed)
We are sorry that you are not feeling well.  Here is how we plan to help!  Based on your presentation I believe you most likely have A cough due to bacteria.  When patients have a fever and a productive cough with a change in color or increased sputum production, we are concerned about bacterial bronchitis.  If left untreated it can progress to pneumonia.  If your symptoms do not improve with your treatment plan it is important that you contact your provider.   I have prescribed Doxycycline 100 mg twice a day for 7 days     In addition you may use A prescription cough medication called Tessalon Perles 100mg. You may take 1-2 capsules every 8 hours as needed for your cough.    From your responses in the eVisit questionnaire you describe inflammation in the upper respiratory tract which is causing a significant cough.  This is commonly called Bronchitis and has four common causes:    Allergies  Viral Infections  Acid Reflux  Bacterial Infection Allergies, viruses and acid reflux are treated by controlling symptoms or eliminating the cause. An example might be a cough caused by taking certain blood pressure medications. You stop the cough by changing the medication. Another example might be a cough caused by acid reflux. Controlling the reflux helps control the cough.  USE OF BRONCHODILATOR ("RESCUE") INHALERS: There is a risk from using your bronchodilator too frequently.  The risk is that over-reliance on a medication which only relaxes the muscles surrounding the breathing tubes can reduce the effectiveness of medications prescribed to reduce swelling and congestion of the tubes themselves.  Although you feel brief relief from the bronchodilator inhaler, your asthma may actually be worsening with the tubes becoming more swollen and filled with mucus.  This can delay other crucial treatments, such as oral steroid medications. If you need to use a bronchodilator inhaler daily, several times per  day, you should discuss this with your provider.  There are probably better treatments that could be used to keep your asthma under control.     HOME CARE . Only take medications as instructed by your medical team. . Complete the entire course of an antibiotic. . Drink plenty of fluids and get plenty of rest. . Avoid close contacts especially the very young and the elderly . Cover your mouth if you cough or cough into your sleeve. . Always remember to wash your hands . A steam or ultrasonic humidifier can help congestion.   GET HELP RIGHT AWAY IF: . You develop worsening fever. . You become short of breath . You cough up blood. . Your symptoms persist after you have completed your treatment plan MAKE SURE YOU   Understand these instructions.  Will watch your condition.  Will get help right away if you are not doing well or get worse.  Your e-visit answers were reviewed by a board certified advanced clinical practitioner to complete your personal care plan.  Depending on the condition, your plan could have included both over the counter or prescription medications. If there is a problem please reply  once you have received a response from your provider. Your safety is important to us.  If you have drug allergies check your prescription carefully.    You can use MyChart to ask questions about today's visit, request a non-urgent call back, or ask for a work or school excuse for 24 hours related to this e-Visit. If it has been greater than 24 hours   you will need to follow up with your provider, or enter a new e-Visit to address those concerns. You will get an e-mail in the next two days asking about your experience.  I hope that your e-visit has been valuable and will speed your recovery. Thank you for using e-visits.   

## 2018-10-11 ENCOUNTER — Other Ambulatory Visit: Payer: Self-pay | Admitting: Nurse Practitioner

## 2018-10-11 ENCOUNTER — Other Ambulatory Visit: Payer: Self-pay | Admitting: Physical Medicine & Rehabilitation

## 2018-10-11 DIAGNOSIS — R569 Unspecified convulsions: Secondary | ICD-10-CM

## 2018-10-11 DIAGNOSIS — M797 Fibromyalgia: Secondary | ICD-10-CM

## 2018-10-11 DIAGNOSIS — M62838 Other muscle spasm: Secondary | ICD-10-CM

## 2018-10-11 MED ORDER — OXYCODONE-ACETAMINOPHEN 7.5-325 MG PO TABS: 1.0000 | ORAL_TABLET | Freq: Every day | ORAL | 0 refills | Status: DC | PRN

## 2018-10-11 NOTE — Telephone Encounter (Signed)
Returned Autumn Davis call, her brother is scheduled for surgery on 10/19/2018, she doesn't have a ride to bring her her scheduled appointment on 10/19/2018. Her appointment will be changed to 11/15/2018 with Autumn Davis and she will be scheduled to see Autumn Davis on 12/15/18 .  PMP Aware Web-site was  Reviewed, last Oxycodone was filled on 09/25/18.  She verbalizes understanding. Oxycodone was e-scribed.

## 2018-10-11 NOTE — Telephone Encounter (Signed)
Patient does not have transportation to her appointment on 10/19/18 with Dr. Riley KillSwartz. She will run out of medication soon after. She has seen Dr Riley KillSwartz only once this year. He does not have any openings until January. Please advise.

## 2018-10-19 ENCOUNTER — Encounter: Payer: Managed Care, Other (non HMO) | Admitting: Physical Medicine & Rehabilitation

## 2018-11-05 ENCOUNTER — Encounter: Payer: Self-pay | Admitting: Nurse Practitioner

## 2018-11-05 ENCOUNTER — Ambulatory Visit (INDEPENDENT_AMBULATORY_CARE_PROVIDER_SITE_OTHER): Payer: Managed Care, Other (non HMO) | Admitting: Nurse Practitioner

## 2018-11-05 VITALS — BP 133/74 | HR 96 | Temp 97.7°F | Ht 67.0 in | Wt 196.0 lb

## 2018-11-05 DIAGNOSIS — M797 Fibromyalgia: Secondary | ICD-10-CM

## 2018-11-05 DIAGNOSIS — D8989 Other specified disorders involving the immune mechanism, not elsewhere classified: Secondary | ICD-10-CM

## 2018-11-05 DIAGNOSIS — M329 Systemic lupus erythematosus, unspecified: Secondary | ICD-10-CM | POA: Diagnosis not present

## 2018-11-05 DIAGNOSIS — F3342 Major depressive disorder, recurrent, in full remission: Secondary | ICD-10-CM | POA: Diagnosis not present

## 2018-11-05 DIAGNOSIS — E559 Vitamin D deficiency, unspecified: Secondary | ICD-10-CM | POA: Diagnosis not present

## 2018-11-05 DIAGNOSIS — H40033 Anatomical narrow angle, bilateral: Secondary | ICD-10-CM | POA: Diagnosis not present

## 2018-11-05 DIAGNOSIS — I479 Paroxysmal tachycardia, unspecified: Secondary | ICD-10-CM

## 2018-11-05 DIAGNOSIS — R1310 Dysphagia, unspecified: Secondary | ICD-10-CM

## 2018-11-05 DIAGNOSIS — H1013 Acute atopic conjunctivitis, bilateral: Secondary | ICD-10-CM | POA: Diagnosis not present

## 2018-11-05 DIAGNOSIS — E782 Mixed hyperlipidemia: Secondary | ICD-10-CM | POA: Diagnosis not present

## 2018-11-05 DIAGNOSIS — K219 Gastro-esophageal reflux disease without esophagitis: Secondary | ICD-10-CM

## 2018-11-05 DIAGNOSIS — K581 Irritable bowel syndrome with constipation: Secondary | ICD-10-CM | POA: Diagnosis not present

## 2018-11-05 DIAGNOSIS — Z30013 Encounter for initial prescription of injectable contraceptive: Secondary | ICD-10-CM

## 2018-11-05 DIAGNOSIS — E079 Disorder of thyroid, unspecified: Secondary | ICD-10-CM | POA: Diagnosis not present

## 2018-11-05 DIAGNOSIS — R569 Unspecified convulsions: Secondary | ICD-10-CM

## 2018-11-05 NOTE — Progress Notes (Signed)
Subjective:    Patient ID: Autumn Davis, female    DOB: 01-13-1986, 32 y.o.   MRN: [id number]   Chief Complaint: medical management of chronic issues  HPI:  1. Gastroesophageal reflux disease without esophagitis  Is currently on nothing because insurance will not pay for dexilant daily. Works well to keep symptoms under control  2. Irritable bowel syndrome with constipation  She denies any recent flare ups. She takes daly stool softner and uses miralax as needed  3. Dysphagia, unspecified type  Has not had any recent problems with her swallowing  4. Thyroid disease  No problems that she is aware of.  5. Vitamin D deficiency  Takes a daily vitamin d supplement  6. Seizures (HCC)  Have not had a seizure in over a year.  7. Systemic lupus erythematosus, unspecified SLE type, unspecified organ involvement status [facility])  She see rheumatologist. Still not 100% sure that she has lupus. Testing is not always conclusive. She has had no recent changes to hr medications.  8. Inflammatory autoimmune disorder (HCC)  As stated previously she has been dx with lupus even though they are not 100% sure that that is her problem . They know it s an auto immune condition but just cannot say for sure what it is.  9. Mixed hyperlipidemia  She has been watching diet and exercising and had lost over 30lbs at last visit.  10. Fibromyalgia  Has muscle aches all the time. Is currently going to pain clinic for pain managemsnt  11. Recurrent major depressive disorder, in full remission [facility])  She has been having some marital problems. She just tries to stay busy with her son. She is currently on no meds for this. Does not really want to add any meds if she can help it. Says that her husband has actually moved out.  12. Tachycardia, paroxysmal (HCC)  No recent runs of tachycardia    Outpatient Encounter Medications as of 11/05/2018  Medication Sig  . acetaZOLAMIDE (DIAMOX) 250 MG tablet TAKE 1 TABLET EVERY  MORNING, TAKE 1 TABLET AT 2PM, AND TAKE 2 TABLETSAT BEDTIME  . albuterol (PROVENTIL HFA;VENTOLIN HFA) 108 (90 BASE) MCG/ACT inhaler Inhale 2 puffs into the lungs every 6 (six) hours as needed for wheezing or shortness of breath.  Marland Kitchen amitriptyline (ELAVIL) 75 MG tablet Take 1 tablet (75 mg total) by mouth at bedtime.  Marland Kitchen atorvastatin (LIPITOR) 40 MG tablet TAKE ONE (1) TABLET EACH DAY  . baclofen (LIORESAL) 20 MG tablet TAKE ONE TABLET FOUR TIMES DAILY  . Beclomethasone Dipropionate (QNASL CHILDRENS) 40 MCG/ACT AERS Place into the nose.  . benzonatate (TESSALON) 100 MG capsule Take 1 capsule (100 mg total) by mouth 3 (three) times daily as needed for cough.  . Calcium-Magnesium-Vitamin D (CALCIUM 1200+D3 PO) Take 1 tablet by mouth daily.  . cetirizine (ZYRTEC) 10 MG tablet Take 10 mg by mouth daily.  . Cholecalciferol (VITAMIN D3) 50000 units CAPS Take 50,000 Units by mouth once a week.  . cyanocobalamin (,VITAMIN B-12,) 1000 MCG/ML injection 1ml im QW for 4 weeks then QOW for 4 weeks then monthly  . cycloSPORINE (RESTASIS) 0.05 % ophthalmic emulsion Place 1 drop into both eyes 2 (two) times daily.  Marland Kitchen dexlansoprazole (DEXILANT) 60 MG capsule TAKE ONE (1) CAPSULE EACH DAY  . diclofenac (VOLTAREN) 75 MG EC tablet TAKE ONE TABLET BY MOUTH TWICE DAILY  . docusate sodium (COLACE) 100 MG capsule Take 100 mg by mouth 2 (two) times daily. PRN  . doxycycline (  VIBRAMYCIN) 100 MG capsule Take 1 capsule (100 mg total) by mouth 2 (two) times daily.  Marland Kitchen EPINEPHRINE 0.3 mg/0.3 mL IJ SOAJ injection USE AS DIRECTED  . FORTEO 600 MCG/2.4ML SOLN Inject 20 mcg into the skin at bedtime.  . furosemide (LASIX) 20 MG tablet Take 1 tablet (20 mg total) by mouth daily.  Marland Kitchen gabapentin (NEURONTIN) 600 MG tablet TAKE ONE TABLET FIVE TIMES DAILY  . ipratropium-albuterol (DUONEB) 0.5-2.5 (3) MG/3ML SOLN Take 3 mLs by nebulization every 4 (four) hours as needed.  Marland Kitchen L-Methylfolate 15 MG TABS Take 1 tablet (15 mg total) by mouth  daily.  Marland Kitchen L-Methylfolate-Algae (L-METHYLFOLATE FORTE) 15-90.314 MG CAPS TAKE ONE (1) CAPSULE EACH DAY  . medroxyPROGESTERone (DEPO-PROVERA) 150 MG/ML injection Inject 150 mg into the muscle as directed. Every 2 months  . metoprolol succinate (TOPROL-XL) 25 MG 24 hr tablet TAKE ONE (1) TABLET EACH DAY  . Miconazole 50 MG TABS Place 1 tablet (50 mg total) inside cheek daily.  . montelukast (SINGULAIR) 10 MG tablet TAKE ONE TABLET DAILY AT BEDTIME  . mupirocin ointment (BACTROBAN) 2 % Apply 1 application topically 2 (two) times daily.  Marland Kitchen nystatin (MYCOSTATIN) 100000 UNIT/ML suspension   . nystatin-triamcinolone ointment (MYCOLOG) Apply 1 application topically 2 (two) times daily.  . Omega 3 1000 MG CAPS Take 1 capsule by mouth daily.  Marland Kitchen oxyCODONE-acetaminophen (PERCOCET) 7.5-325 MG tablet Take 1 tablet by mouth 5 (five) times daily as needed for moderate pain. No More Than 5 a day  . predniSONE (DELTASONE) 5 MG tablet TAKE ONE TABLET EVERY MORNING  . promethazine (PHENERGAN) 12.5 MG tablet Take 1 tablet (12.5 mg total) by mouth every 8 (eight) hours as needed for nausea or vomiting.  Marland Kitchen scopolamine (TRANSDERM-SCOP, 1.5 MG,) 1 MG/3DAYS Place 1 patch (1.5 mg total) onto the skin every 3 (three) days.  . tamsulosin (FLOMAX) 0.4 MG CAPS capsule Take 1 capsule (0.4 mg total) by mouth daily.  . vitamin E (VITAMIN E) 400 UNIT capsule Take 400 Units by mouth daily.       New complaints: None today  Social history: Husband and her are separated  Review of Systems  Constitutional: Negative for activity change and appetite change.  HENT: Negative.   Eyes: Negative for pain.  Respiratory: Negative for shortness of breath.   Cardiovascular: Negative for chest pain, palpitations and leg swelling.  Gastrointestinal: Negative for abdominal pain.  Endocrine: Negative for polydipsia.  Genitourinary: Negative.   Skin: Negative for rash.  Neurological: Negative for dizziness, weakness and headaches.    Hematological: Does not bruise/bleed easily.  Psychiatric/Behavioral: Negative.   All other systems reviewed and are negative.      Objective:   Physical Exam Vitals signs and nursing note reviewed.  Constitutional:      General: She is not in acute distress.    Appearance: Normal appearance. She is well-developed.  HENT:     Head: Normocephalic.     Nose: Nose normal.  Eyes:     Pupils: Pupils are equal, round, and reactive to light.  Neck:     Musculoskeletal: Normal range of motion and neck supple.     Vascular: No carotid bruit or JVD.  Cardiovascular:     Rate and Rhythm: Normal rate and regular rhythm.     Heart sounds: Normal heart sounds.  Pulmonary:     Effort: Pulmonary effort is normal. No respiratory distress.     Breath sounds: Normal breath sounds. No wheezing or rales.  Chest:  Chest wall: No tenderness.  Abdominal:     General: Bowel sounds are normal. There is no distension or abdominal bruit.     Palpations: Abdomen is soft. There is no hepatomegaly, splenomegaly, mass or pulsatile mass.     Tenderness: There is no abdominal tenderness.  Musculoskeletal: Normal range of motion.  Lymphadenopathy:     Cervical: No cervical adenopathy.  Skin:    General: Skin is warm and dry.  Neurological:     Mental Status: She is alert and oriented to person, place, and time.     Deep Tendon Reflexes: Reflexes are normal and symmetric.  Psychiatric:        Behavior: Behavior normal.        Thought Content: Thought content normal.        Judgment: Judgment normal.    BP 133/74   Pulse 96   Temp 97.7 F (36.5 C) (Oral)   Ht 5\' 7"  (1.702 m)   Wt 196 lb (88.9 kg)   BMI 30.70 kg/m       Assessment & Plan:  Roohi Tremper comes in today with chief complaint of Medical Management of Chronic Issues   Diagnosis and orders addressed:  1. Gastroesophageal reflux disease without esophagitis Avoid spicy foods Do not eat 2 hours prior to bedtime  2. Irritable  bowel syndrome with constipation Continue to control with diet  3. Dysphagia, unspecified type  4. Thyroid disease Labs pending  5. Vitamin D deficiency  6. Seizures (HCC) Keep follow up  7. Systemic lupus erythematosus, unspecified SLE type, unspecified organ involvement status (HCC) Keep follow up with rheumatologist  8. Inflammatory autoimmune disorder (HCC)  9. Mixed hyperlipidemia Low fat  10. Fibromyalgia Keep appointment with pain management  11. Recurrent major depressive disorder, in full remission (HCC) Stress management  12. Tachycardia, paroxysmal (HCC) Avoid caffeine   Labs pending Health Maintenance reviewed Diet and exercise encouraged  Follow up plan: 3 months   Mary-Margaret Daphine Deutscher, FNP

## 2018-11-06 LAB — CMP14+EGFR
ALBUMIN: 4.7 g/dL (ref 3.5–5.5)
ALK PHOS: 85 IU/L (ref 39–117)
ALT: 20 IU/L (ref 0–32)
AST: 15 IU/L (ref 0–40)
Albumin/Globulin Ratio: 2.1 (ref 1.2–2.2)
BILIRUBIN TOTAL: 0.3 mg/dL (ref 0.0–1.2)
BUN / CREAT RATIO: 15 (ref 9–23)
BUN: 12 mg/dL (ref 6–20)
CHLORIDE: 111 mmol/L — AB (ref 96–106)
CO2: 20 mmol/L (ref 20–29)
Calcium: 9.4 mg/dL (ref 8.7–10.2)
Creatinine, Ser: 0.78 mg/dL (ref 0.57–1.00)
GFR calc Af Amer: 115 mL/min/{1.73_m2} (ref >59)
GFR calc non Af Amer: 99 mL/min/{1.73_m2} (ref >59)
GLOBULIN, TOTAL: 2.2 g/dL (ref 1.5–4.5)
GLUCOSE: 93 mg/dL (ref 65–99)
Potassium: 3.4 mmol/L — ABNORMAL LOW (ref 3.5–5.2)
SODIUM: 146 mmol/L — AB (ref 134–144)
Total Protein: 6.9 g/dL (ref 6.0–8.5)

## 2018-11-06 LAB — LIPID PANEL
CHOLESTEROL TOTAL: 184 mg/dL (ref 100–199)
Chol/HDL Ratio: 3.5 ratio (ref 0.0–4.4)
HDL: 53 mg/dL (ref >39)
LDL Calculated: 102 mg/dL — ABNORMAL HIGH (ref 0–99)
Triglycerides: 144 mg/dL (ref 0–149)
VLDL Cholesterol Cal: 29 mg/dL (ref 5–40)

## 2018-11-15 ENCOUNTER — Encounter: Payer: Self-pay | Admitting: Registered Nurse

## 2018-11-15 ENCOUNTER — Encounter: Payer: Managed Care, Other (non HMO) | Attending: Registered Nurse | Admitting: Registered Nurse

## 2018-11-15 ENCOUNTER — Other Ambulatory Visit: Payer: Self-pay | Admitting: Nurse Practitioner

## 2018-11-15 VITALS — BP 123/82 | HR 100 | Resp 14 | Ht 68.0 in | Wt 195.0 lb

## 2018-11-15 DIAGNOSIS — M546 Pain in thoracic spine: Secondary | ICD-10-CM

## 2018-11-15 DIAGNOSIS — M797 Fibromyalgia: Secondary | ICD-10-CM | POA: Diagnosis present

## 2018-11-15 DIAGNOSIS — M6249 Contracture of muscle, multiple sites: Secondary | ICD-10-CM | POA: Insufficient documentation

## 2018-11-15 DIAGNOSIS — Z5181 Encounter for therapeutic drug level monitoring: Secondary | ICD-10-CM

## 2018-11-15 DIAGNOSIS — M545 Low back pain: Secondary | ICD-10-CM | POA: Diagnosis present

## 2018-11-15 DIAGNOSIS — M329 Systemic lupus erythematosus, unspecified: Secondary | ICD-10-CM | POA: Diagnosis present

## 2018-11-15 DIAGNOSIS — H1013 Acute atopic conjunctivitis, bilateral: Secondary | ICD-10-CM | POA: Diagnosis not present

## 2018-11-15 DIAGNOSIS — Z79891 Long term (current) use of opiate analgesic: Secondary | ICD-10-CM

## 2018-11-15 DIAGNOSIS — G8929 Other chronic pain: Secondary | ICD-10-CM

## 2018-11-15 DIAGNOSIS — G894 Chronic pain syndrome: Secondary | ICD-10-CM

## 2018-11-15 DIAGNOSIS — R569 Unspecified convulsions: Secondary | ICD-10-CM

## 2018-11-15 DIAGNOSIS — M62838 Other muscle spasm: Secondary | ICD-10-CM

## 2018-11-15 DIAGNOSIS — G609 Hereditary and idiopathic neuropathy, unspecified: Secondary | ICD-10-CM

## 2018-11-15 MED ORDER — OXYCODONE-ACETAMINOPHEN 7.5-325 MG PO TABS: 1.0000 | ORAL_TABLET | Freq: Every day | ORAL | 0 refills | Status: DC | PRN

## 2018-11-15 NOTE — Telephone Encounter (Signed)
Last seen 11/05/18  MMM

## 2018-11-15 NOTE — Progress Notes (Signed)
Subjective:    Patient ID: Autumn Davis, female    DOB: [birth year], 32 y.o.   MRN: [id number]  HPI: Autumn Davis is a 32 y.o. female who returns for follow up appointment for chronic pain and medication refill. She states her pain is located in her ribs and bilateral  hip pain .She rates her pain 8. Her current exercise regime is walking and performing stretching exercises.  [name] reports she and her husband is separated and she is living with her parents. Emotional support given.   [name] Morphine equivalent is 56.25 MME.  Last Oral Swab was performed on 06/18/2018, it was consistent.   Mother in room.  Pain Inventory Average Pain 6 Pain Right Now 8 My pain is sharp, burning, dull, stabbing and tingling  In the last 24 hours, has pain interfered with the following? General activity 7 Relation with others 5 Enjoyment of life 5 What TIME of day is your pain at its worst? evening, night Sleep (in general) Poor  Pain is worse with: walking, bending, sitting, inactivity, standing and some activites Pain improves with: rest, heat/ice, therapy/exercise, pacing activities and medication Relief from Meds: 5  Mobility walk without assistance ability to climb steps?  yes do you drive?  no Do you have any goals in this area?  yes  Function I need assistance with the following:  meal prep, household duties and shopping Do you have any goals in this area?  yes  Neuro/Psych bladder control problems bowel control problems weakness numbness tremor tingling dizziness  Prior Studies Any changes since last visit?  no  Physicians involved in your care Any changes since last visit?  no   Family History  Problem Relation Age of Onset  . Thyroid disease Mother   . Colon polyps Father   . Lung cancer Maternal Grandmother   . Cancer Paternal Grandmother        colon/pancreatiec/lymphoma  . Irritable bowel syndrome Brother    Social History   Socioeconomic History  .  Marital status: Married    Spouse name: Not on file  . Number of children: Not on file  . Years of education: Not on file  . Highest education level: Not on file  Occupational History  . Occupation: disabled    Employer: [employer]  Social Needs  . Financial resource strain: Not on file  . Food insecurity:    Worry: Not on file    Inability: Not on file  . Transportation needs:    Medical: Not on file    Non-medical: Not on file  Tobacco Use  . Smoking status: Never Smoker  . Smokeless tobacco: Never Used  Substance and Sexual Activity  . Alcohol use: No    Alcohol/week: 0.0 standard drinks  . Drug use: No  . Sexual activity: Not on file  Lifestyle  . Physical activity:    Days per week: Not on file    Minutes per session: Not on file  . Stress: Not on file  Relationships  . Social connections:    Talks on phone: Not on file    Gets together: Not on file    Attends religious service: Not on file    Active member of club or organization: Not on file    Attends meetings of clubs or organizations: Not on file    Relationship status: Not on file  Other Topics Concern  . Not on file  Social History Narrative  . Not on file  Past Surgical History:  Procedure Laterality Date  . 24 HOUR PH STUDY N/A 10/29/2015   Procedure: 24 HOUR PH STUDY;  Surgeon: Iva Boop, MD;  Location: [facility];  Service: Endoscopy;  Laterality: N/A;  . COLONOSCOPY    . ESOPHAGEAL MANOMETRY N/A 10/29/2015   Procedure: ESOPHAGEAL MANOMETRY (EM);  Surgeon: Iva Boop, MD;  Location: [facility];  Service: Endoscopy;  Laterality: N/A;  . KNEE SURGERY  2002/2003   bil  . RECTAL SURGERY  correction of prolapse   2010   Past Medical History:  Diagnosis Date  . Allergy   . Arthritis    HANDS,HIPS,KNEES  . Bronchitis, chronic/intermittent 01/22/2012  . Depression 01/22/2012  . GERD (gastroesophageal reflux disease)   . Hyperlipidemia   . IBS (irritable bowel syndrome)  01/22/2012  . Lupus (HCC)   . Neuromuscular disorder (HCC)   . Osteoporosis   . Seizures (HCC)   . SOB (shortness of breath)   . Thyroid disease    BP 123/82   Pulse 100   Resp 14   Ht 5\' 8"  (1.727 m)   Wt 195 lb (88.5 kg)   SpO2 97%   BMI 29.65 kg/m   Opioid Risk Score:   Fall Risk Score:  `1  Depression screen PHQ 2/9  Depression screen [facility] 2/9 11/05/2018 08/13/2018 05/13/2018 04/22/2018 02/12/2018 02/09/2018 11/20/2017  Decreased Interest 1 0 1 0 0 1 1  Down, Depressed, Hopeless 0 0 0 0 0 0 1  PHQ - 2 Score 1 0 1 0 0 1 2  Altered sleeping - - - - 0 - -  Tired, decreased energy - - - - 0 - -  Change in appetite - - - - 0 - -  Feeling bad or failure about yourself  - - - - 0 - -  Trouble concentrating - - - - 0 - -  Moving slowly or fidgety/restless - - - - 0 - -  Suicidal thoughts - - - - 0 - -  PHQ-9 Score - - - - 0 - -   Review of Systems  Constitutional: Negative.   HENT: Negative.   Eyes: Negative.   Respiratory: Negative.   Cardiovascular: Negative.   Gastrointestinal: Positive for constipation and diarrhea.  Endocrine: Negative.   Genitourinary:       Incontinence  Musculoskeletal: Positive for back pain.  Allergic/Immunologic: Negative.   Neurological: Positive for dizziness, tremors, weakness and numbness.  Psychiatric/Behavioral: Negative.   All other systems reviewed and are negative.      Objective:   Physical Exam Vitals signs and nursing note reviewed.  Constitutional:      Appearance: Normal appearance.  Neck:     Musculoskeletal: Normal range of motion and neck supple.  Cardiovascular:     Rate and Rhythm: Normal rate and regular rhythm.     Pulses: Normal pulses.     Heart sounds: Normal heart sounds.  Pulmonary:     Effort: Pulmonary effort is normal.     Breath sounds: Normal breath sounds.  Musculoskeletal:     Comments: Normal Muscle Bulk and Muscle Testing Reveals:  Upper Extremities: Full ROM and Muscle Strength 5.5 Thoracic  Paraspinal Tenderness: T-7-T-9   Lower Extremities: Full ROM and Muscle Strength 5/5  Arises from Table with ease Narrow Based Gait   Skin:    General: Skin is warm and dry.  Neurological:     Mental Status: She is alert and oriented to person, place, and time.  Psychiatric:        Mood and Affect: Mood normal.        Behavior: Behavior normal.           Assessment & Plan:  1. Chronic seizure disorder: No seizure's. Continuecurrent medication regimen withGabapentin. Neurology Following.11/15/2018 2. Chronic muscle spasms, weakness with associated pain disorder: Continuecurrent medication regimen withBaclofen. Continue with Exercise regime.11/15/2018 3. Chronic dysphagia: GI Following.11/15/2018 4. Anxiety with depression : Stable. Continuecurrent medication regimen withElavil.11/15/2018 5. Fibromyalgia/ Rib Pain/Chronic Pain:Continue Diclofenac: Continue with exercise and heat Therapy.11/15/2018 Continue: oxyCODONE 7.5/325mg  onetablet 5 times daily as needed #150. We will continue opioid monitoring program, this consists of regular clinic visits, examinations, urine drug screen, pill counts as well as use of [place] Controlled Substance Reporting System. 6. Peripheral Neuropathy:Continuecurrent medication regimen withGabapentin: 11/15/2018  20 minutes of face to face patient care time was spent during this visit. All questions were encouraged and answered.  F/U in 1 month

## 2018-11-18 ENCOUNTER — Other Ambulatory Visit: Payer: Self-pay | Admitting: Nurse Practitioner

## 2018-11-18 MED ORDER — AMOXICILLIN 875 MG PO TABS: 875.0000 mg | ORAL_TABLET | Freq: Two times a day (BID) | ORAL | 0 refills | Status: DC

## 2018-12-10 ENCOUNTER — Other Ambulatory Visit: Payer: Self-pay | Admitting: Nurse Practitioner

## 2018-12-10 MED ORDER — SULFAMETHOXAZOLE-TRIMETHOPRIM 800-160 MG PO TABS: 1.0000 | ORAL_TABLET | Freq: Two times a day (BID) | ORAL | 0 refills | Status: DC

## 2018-12-13 ENCOUNTER — Encounter: Payer: Managed Care, Other (non HMO) | Attending: Registered Nurse | Admitting: Physical Medicine & Rehabilitation

## 2018-12-13 ENCOUNTER — Encounter: Payer: Self-pay | Admitting: Physical Medicine & Rehabilitation

## 2018-12-13 DIAGNOSIS — M329 Systemic lupus erythematosus, unspecified: Secondary | ICD-10-CM | POA: Insufficient documentation

## 2018-12-13 DIAGNOSIS — M545 Low back pain: Secondary | ICD-10-CM | POA: Diagnosis present

## 2018-12-13 DIAGNOSIS — M797 Fibromyalgia: Secondary | ICD-10-CM

## 2018-12-13 DIAGNOSIS — M62838 Other muscle spasm: Secondary | ICD-10-CM | POA: Diagnosis not present

## 2018-12-13 DIAGNOSIS — M6249 Contracture of muscle, multiple sites: Secondary | ICD-10-CM | POA: Insufficient documentation

## 2018-12-13 DIAGNOSIS — R569 Unspecified convulsions: Secondary | ICD-10-CM

## 2018-12-13 MED ORDER — OXYCODONE-ACETAMINOPHEN 7.5-325 MG PO TABS: 1.0000 | ORAL_TABLET | Freq: Every day | ORAL | 0 refills | Status: DC | PRN

## 2018-12-13 NOTE — Patient Instructions (Signed)
CONTINUE TO WORK ON STRESS MANAGEMENT, PROPER SLEEP.    REDUCE ON OF YOUR OXYCODONES TO 1/2 TAB

## 2018-12-13 NOTE — Progress Notes (Signed)
Subjective:    Patient ID: Autumn Davis, female    DOB: 1986-05-22, 32 y.o.   MRN: [id number]  HPI   Autumn Davis is here in follow up of her chronic pain. She is still working thru the divorce with her husband which has been very stressful. She has already noticed that her stress levels as a whole are improving now that she's no longer in the house with her ex.   She remains on percocet for pain control. She is using percocet  7.5 5x daily. She is also on gabapentin for her fms/neuropahtic pain  She has made a conscious effort to lose weight by increasing her exercise and watching what she eats. She has lost around 30lbs over the last year.   She has noticed some burning pains which have increased from just her lower legs and feet to where they are in in her upper legs and back. Sometimes when they hurt her legs are flushed also. She has woken up at night feeling very cold, she measured her temp at 94 on more than one occasion. She also had had problems with tachycardia.    Pain Inventory Average Pain 7 Pain Right Now 5 My pain is constant, sharp, burning, stabbing and tingling  In the last 24 hours, has pain interfered with the following? General activity 8 Relation with others 8 Enjoyment of life 5 What TIME of day is your pain at its worst? night Sleep (in general) Poor  Pain is worse with: walking, bending, sitting, inactivity, standing and some activites Pain improves with: rest, heat/ice, therapy/exercise, pacing activities, medication and TENS Relief from Meds: 3  Mobility ability to climb steps?  yes do you drive?  no  Function I need assistance with the following:  meal prep, household duties and shopping  Neuro/Psych bladder control problems bowel control problems weakness numbness tremor tingling trouble walking spasms dizziness  Prior Studies Any changes since last visit?  no  Physicians involved in your care Any changes since last visit?  no   Family  History  Problem Relation Age of Onset  . Thyroid disease Mother   . Colon polyps Father   . Lung cancer Maternal Grandmother   . Cancer Paternal Grandmother        colon/pancreatiec/lymphoma  . Irritable bowel syndrome Brother    Social History   Socioeconomic History  . Marital status: Married    Spouse name: Not on file  . Number of children: Not on file  . Years of education: Not on file  . Highest education level: Not on file  Occupational History  . Occupation: disabled    Employer: [employer]  Social Needs  . Financial resource strain: Not on file  . Food insecurity:    Worry: Not on file    Inability: Not on file  . Transportation needs:    Medical: Not on file    Non-medical: Not on file  Tobacco Use  . Smoking status: Never Smoker  . Smokeless tobacco: Never Used  Substance and Sexual Activity  . Alcohol use: No    Alcohol/week: 0.0 standard drinks  . Drug use: No  . Sexual activity: Not on file  Lifestyle  . Physical activity:    Days per week: Not on file    Minutes per session: Not on file  . Stress: Not on file  Relationships  . Social connections:    Talks on phone: Not on file    Gets together: Not on file  Attends religious service: Not on file    Active member of club or organization: Not on file    Attends meetings of clubs or organizations: Not on file    Relationship status: Not on file  Other Topics Concern  . Not on file  Social History Narrative  . Not on file   Past Surgical History:  Procedure Laterality Date  . 24 HOUR PH STUDY N/A 10/29/2015   Procedure: 24 HOUR PH STUDY;  Surgeon: Iva Boop, MD;  Location: [facility];  Service: Endoscopy;  Laterality: N/A;  . COLONOSCOPY    . ESOPHAGEAL MANOMETRY N/A 10/29/2015   Procedure: ESOPHAGEAL MANOMETRY (EM);  Surgeon: Iva Boop, MD;  Location: [facility];  Service: Endoscopy;  Laterality: N/A;  . KNEE SURGERY  2002/2003   bil  . RECTAL SURGERY   correction of prolapse   2010   Past Medical History:  Diagnosis Date  . Allergy   . Arthritis    HANDS,HIPS,KNEES  . Bronchitis, chronic/intermittent 01/22/2012  . Depression 01/22/2012  . GERD (gastroesophageal reflux disease)   . Hyperlipidemia   . IBS (irritable bowel syndrome) 01/22/2012  . Lupus (HCC)   . Neuromuscular disorder (HCC)   . Osteoporosis   . Seizures (HCC)   . SOB (shortness of breath)   . Thyroid disease    BP 126/81   Pulse 82   Ht 5' 7.5" (1.715 m)   Wt 195 lb (88.5 kg)   SpO2 95%   BMI 30.09 kg/m   Opioid Risk Score:   Fall Risk Score:  `1  Depression screen PHQ 2/9  Depression screen [facility] 2/9 11/05/2018 08/13/2018 05/13/2018 04/22/2018 02/12/2018 02/09/2018 11/20/2017  Decreased Interest 1 0 1 0 0 1 1  Down, Depressed, Hopeless 0 0 0 0 0 0 1  PHQ - 2 Score 1 0 1 0 0 1 2  Altered sleeping - - - - 0 - -  Tired, decreased energy - - - - 0 - -  Change in appetite - - - - 0 - -  Feeling bad or failure about yourself  - - - - 0 - -  Trouble concentrating - - - - 0 - -  Moving slowly or fidgety/restless - - - - 0 - -  Suicidal thoughts - - - - 0 - -  PHQ-9 Score - - - - 0 - -     Review of Systems  Constitutional: Negative.   HENT: Negative.   Eyes: Negative.   Respiratory: Negative.   Cardiovascular: Negative.   Gastrointestinal: Negative.   Endocrine: Negative.   Genitourinary: Negative.   Musculoskeletal: Positive for arthralgias, gait problem and myalgias.  Skin: Negative.   Allergic/Immunologic: Negative.   Neurological: Positive for dizziness, tremors, weakness and numbness.  Hematological: Negative.   Psychiatric/Behavioral: Negative.   All other systems reviewed and are negative.      Objective:   Physical Exam General: No acute distress. Has lost weight HEENT: EOMI, oral membranes moist Cards: reg rate  Chest: normal effort Abdomen: Soft, NT, ND Skin: dry, intact, blotchy areas on neck/shoulder Extremities: no edema Neuro:  motor5/5.Sensory intact except for sl decrease to FT distally. balance improved, slightly wide based.  Musculoskeletal:general trunk pain.  Numerous tender points Psych:pleasant   Assessment & Plan:  1. Chronic seizure disorder---? myoclonic jerks/myotonia 2. 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 ?dysautonomia.  3. Chronic dysphagia  4. Right low back/flank pain  5. Fibromyalgia syndrome.  6. Anxiety with depression  7. Pulmonary function abnormalities., ?CO2 retention? ---recent normal ECHO from 09/15/14 8. Osteoporosis:    Plan:  1. Continue Elavil at 75mg  qhs  2. Maintain baclofen to 20mg  for muscle spasms.Maintain HEP for ROM 3.Continue prednisone per rheum 5mg  daily. Consider propranolol trial for autonomic symptoms.  4.  Diclofenac 75mg  BID for joint pain 5. Oxycodone 7.5mg  fr breakthrough pain #150.  We will continue the controlled substance monitoring program, this consists of regular clinic visits, examinations, routine drug screening, pill counts as well as use of [place] Controlled Substance Reporting System. [place] today.    -target for next few months would be oxycodone 4 per day 6. Continue Gabapentin 600mg 5x dailyfor seizures, spasms, neuro pain.             This continues to provide some relief 7. Continue lidoderm patches for trunk/back pain.  8.calcium, vit D, B12, forteo per endocrine and primary.             -dietary importance has been discussed   -maintain diet and exercise momentum she has started  -discussed importance of stress management as well.   Follow up with NP next month. of face to face patient care time were spent during this visit. All questions were encouraged and answered.

## 2018-12-15 ENCOUNTER — Ambulatory Visit: Payer: Managed Care, Other (non HMO) | Admitting: Physical Medicine & Rehabilitation

## 2018-12-20 ENCOUNTER — Ambulatory Visit (INDEPENDENT_AMBULATORY_CARE_PROVIDER_SITE_OTHER)
Admission: RE | Admit: 2018-12-20 | Discharge: 2018-12-20 | Disposition: A | Discharge status: Home / Self Care | Payer: Managed Care, Other (non HMO) | Source: Ambulatory Visit | Attending: Pulmonary Disease | Admitting: Pulmonary Disease

## 2018-12-20 ENCOUNTER — Encounter: Payer: Self-pay | Admitting: Pulmonary Disease

## 2018-12-20 ENCOUNTER — Ambulatory Visit (INDEPENDENT_AMBULATORY_CARE_PROVIDER_SITE_OTHER): Payer: Managed Care, Other (non HMO) | Admitting: Pulmonary Disease

## 2018-12-20 VITALS — BP 114/60 | HR 78 | Ht 68.0 in | Wt 192.0 lb

## 2018-12-20 DIAGNOSIS — R0683 Snoring: Secondary | ICD-10-CM | POA: Diagnosis not present

## 2018-12-20 DIAGNOSIS — R0609 Other forms of dyspnea: Secondary | ICD-10-CM

## 2018-12-20 DIAGNOSIS — R0600 Dyspnea, unspecified: Secondary | ICD-10-CM

## 2018-12-20 NOTE — Progress Notes (Signed)
Subjective:    Patient ID: Autumn Davis, female    DOB: [birth year], 33 y.o.   MRN: [id number]  HPI    Review of Systems  Constitutional: Negative for fever and unexpected weight change.  HENT: Negative for congestion, dental problem, ear pain, nosebleeds, postnasal drip, rhinorrhea, sinus pressure, sneezing, sore throat and trouble swallowing.   Eyes: Negative for redness and itching.  Respiratory: Positive for cough, chest tightness, shortness of breath and wheezing.   Cardiovascular: Negative for palpitations and leg swelling.  Gastrointestinal: Positive for abdominal pain, constipation, diarrhea and nausea. Negative for vomiting.  Genitourinary: Negative for dysuria.  Musculoskeletal: Negative for joint swelling.  Skin: Negative for rash.  Allergic/Immunologic: Positive for environmental allergies and immunocompromised state. Negative for food allergies.  Neurological: Positive for headaches.  Hematological: Bruises/bleeds easily.  Psychiatric/Behavioral: Negative for dysphoric mood. The patient is nervous/anxious.        Objective:   Physical Exam        Assessment & Plan:

## 2018-12-20 NOTE — Patient Instructions (Signed)
Will schedule pulmonary function test, chest xray, and home sleep study  Follow up in 2 to 3 weeks with Dr. Craige Cotta or Nurse Practitioner

## 2018-12-20 NOTE — Progress Notes (Addendum)
[facility]  Chief Complaint  Patient presents with  . pulm consult    Pt temp drops 94.2 at night, super cold feeling. Pt has SOB with exertion, some wheezing, some coughing.    Constitutional:  BP 114/60 (BP Location: Left Arm, Cuff Size: Normal)   Pulse 78   Ht 5\' 8"  (1.727 m)   Wt 192 lb (87.1 kg)   SpO2 99%   BMI 29.19 kg/m   Past Medical History:  Chronic pain, Lymphedema, Allergies, Neuropathy, Tachycardia, Osteoporosis, IBS, GERD, Depression  Brief Summary:  Autumn Davis is a 33 y.o. female with dyspnea.  She was previously seen by Dr. Marchelle Gearing in 2015 and 2016.  She had extensive evaluation for dyspnea, but not clear cause was found.  She was also seen by rheumatology and neurology at that time.  She has been maintained on chronic prednisone therapy, but reason is not clear.  She did have mildly elevated ANA, but this was felt to be non specific.  She has fluctuations in her temperature.  She will get cold and then her temperature check will read 8F.  She will then start to get hot.  She also has intermittent episodes of tachycardia and takes toprol for this.  She has lab work and appointment scheduled with Dr. Talmage Nap with endocrinology later this month.    She has noticed trouble with her sleep.  She snores, and will stop breathing at night.  She feels tired during the day.  She is followed by Dr. Barnetta Chapel for allergies.    Epworth score 12 out of 24.   Physical Exam:   Appearance - well kempt   ENMT - clear nasal mucosa, midline nasal  septum, no oral exudates, no LAN, trachea midline  Respiratory - normal chest wall, normal respiratory effort, no accessory muscle use, no wheeze/rales, MP 4, retrognathia  CV - s1s2 regular rate and rhythm, no murmurs, no peripheral edema, radial pulses symmetric  GI - soft, non tender, no masses  Lymph - no adenopathy noted in neck and axillary areas  MSK - normal gait  Ext - no  cyanosis, clubbing, or joint inflammation noted  Skin - no rashes, lesions, or ulcers  Neuro - normal strength, oriented x 3  Psych - normal mood and affect  Discussion:  She has vague symptoms of temperature fluctuation, intermittent tachycardia, and dyspnea.  She also has intermittent cough, wheeze, and chest congestion.  She has history of positive ANA, but it is unclear whether she was ever diagnosed with any specific type of connective tissue disease.  She has been maintained on chronic prednisone therapy, but reason for this is unclear.  She also has polypharmacy which likely is contributing to some of her symptoms.  Assessment/Plan:   Dyspnea. - will repeat chest xray and pulmonary function test  Snoring. - will arrange for home sleep study to assess for sleep apnea  Chronic prednisone therapy. - she has appointment with Dr. Talmage Nap from endocrinology later this month  Allergic rhinitis. - she is followed by Dr. Barnetta Chapel with allergy/immunology   Patient Instructions  Will schedule pulmonary function test, chest xray, and home sleep study  Follow up in 2 to 3 weeks with Dr. Craige Cotta or Nurse Practitioner    Autumn Helling, MD [facility] Pager: [phone] 12/20/2018, 5:20 PM  Flow Sheet     Pulmonary tests:  PFT 05/05/14 >> FEV1 1.60 (44%), FEV1% 69, TLC 3.97 (70%), difficulty with test maneuvers HRCT chest  07/21/14 >> normal (reviewed by me)  Sleep tests:    Cardiac tests:  CPST 08/18/14 >> circulatory and muscular limitations Echo 09/15/14 >> EF 60 to 65%   Review of Systems:   Constitutional: Negative for fever and unexpected weight change.  HENT: Negative for congestion, dental problem, ear pain, nosebleeds, postnasal drip, rhinorrhea, sinus pressure, sneezing, sore throat and trouble swallowing.   Eyes: Negative for redness and itching.  Respiratory: Positive for cough, chest tightness, shortness of breath and wheezing.   Cardiovascular:  Negative for palpitations and leg swelling.  Gastrointestinal: Positive for abdominal pain, constipation, diarrhea and nausea. Negative for vomiting.  Genitourinary: Negative for dysuria.  Musculoskeletal: Negative for joint swelling.  Skin: Negative for rash.  Allergic/Immunologic: Positive for environmental allergies and immunocompromised state. Negative for food allergies.  Neurological: Positive for headaches.  Hematological: Bruises/bleeds easily.  Psychiatric/Behavioral: Negative for dysphoric mood. The patient is nervous/anxious.    Medications:   Allergies as of 12/20/2018      Reactions   Ciprofloxacin Other (See Comments)   Muscle weakness and numbness   Compazine Other (See Comments)   hallucinations   Prochlorperazine Other (See Comments), Rash   Pt states it makes her feel like things are crawling on her      Medication List       Accurate as of December 20, 2018  5:20 PM. Always use your most recent med list.        acetaZOLAMIDE 250 MG tablet Commonly known as:  DIAMOX TAKE 1 TABLET EVERY MORNING, TAKE 1 TABLET AT 2PM, AND TAKE 2 TABLETSAT BEDTIME   albuterol 108 (90 Base) MCG/ACT inhaler Commonly known as:  PROVENTIL HFA;VENTOLIN HFA Inhale 2 puffs into the lungs every 6 (six) hours as needed for wheezing or shortness of breath.   amitriptyline 75 MG tablet Commonly known as:  ELAVIL Take 1 tablet (75 mg total) by mouth at bedtime.   atorvastatin 40 MG tablet Commonly known as:  LIPITOR TAKE ONE (1) TABLET EACH DAY   baclofen 20 MG tablet Commonly known as:  LIORESAL TAKE ONE TABLET FOUR TIMES DAILY   CALCIUM 1200+D3 PO Take 1 tablet by mouth daily.   cetirizine 10 MG tablet Commonly known as:  ZYRTEC Take 10 mg by mouth daily.   cyanocobalamin 1000 MCG/ML injection Commonly known as:  (VITAMIN B-12) 80ml im QW for 4 weeks then QOW for 4 weeks then monthly   cycloSPORINE 0.05 % ophthalmic emulsion Commonly known as:  RESTASIS Place 1 drop into  both eyes 2 (two) times daily.   dexlansoprazole 60 MG capsule Commonly known as:  DEXILANT TAKE ONE (1) CAPSULE EACH DAY   diclofenac 75 MG EC tablet Commonly known as:  VOLTAREN TAKE ONE TABLET BY MOUTH TWICE DAILY   docusate sodium 100 MG capsule Commonly known as:  COLACE Take 100 mg by mouth 2 (two) times daily. PRN   EPINEPHrine 0.3 mg/0.3 mL Soaj injection Commonly known as:  EPI-PEN USE AS DIRECTED   FORTEO 600 MCG/2.4ML Soln Generic drug:  Teriparatide (Recombinant) Inject 20 mcg into the skin at bedtime.   furosemide 20 MG tablet Commonly known as:  LASIX Take 1 tablet (20 mg total) by mouth daily.   gabapentin 600 MG tablet Commonly known as:  NEURONTIN TAKE ONE TABLET FIVE TIMES DAILY   ipratropium-albuterol 0.5-2.5 (3) MG/3ML Soln Commonly known as:  DUONEB Take 3 mLs by nebulization every 4 (four) hours as needed.   L-Methylfolate 15 MG Tabs Take 1 tablet (  15 mg total) by mouth daily.   L-METHYLFOLATE FORTE 15-90.314 MG Caps TAKE ONE (1) CAPSULE EACH DAY   medroxyPROGESTERone 150 MG/ML injection Commonly known as:  DEPO-PROVERA Inject 150 mg into the muscle as directed. Every 2 months   metoprolol succinate 25 MG 24 hr tablet Commonly known as:  TOPROL-XL TAKE ONE (1) TABLET EACH DAY   Miconazole 50 MG Tabs Place 1 tablet (50 mg total) inside cheek daily.   montelukast 10 MG tablet Commonly known as:  SINGULAIR TAKE ONE TABLET DAILY AT BEDTIME   mupirocin ointment 2 % Commonly known as:  BACTROBAN Apply 1 application topically 2 (two) times daily.   nystatin 100000 UNIT/ML suspension Commonly known as:  MYCOSTATIN   nystatin-triamcinolone ointment Commonly known as:  MYCOLOG Apply 1 application topically 2 (two) times daily.   Omega 3 1000 MG Caps Take 1 capsule by mouth daily.   oxyCODONE-acetaminophen 7.5-325 MG tablet Commonly known as:  PERCOCET Take 1 tablet by mouth 5 (five) times daily as needed for moderate pain. No More  Than 5 a day   predniSONE 5 MG tablet Commonly known as:  DELTASONE TAKE ONE TABLET EVERY MORNING   promethazine 12.5 MG tablet Commonly known as:  PHENERGAN Take 1 tablet (12.5 mg total) by mouth every 8 (eight) hours as needed for nausea or vomiting.   promethazine 25 MG tablet Commonly known as:  PHENERGAN TAKE 1/2 TABLET EVERY 8 HOURS AS NEEDED FOR NAUSEA AND VOMITING   QNASL CHILDRENS 40 MCG/ACT Aers Generic drug:  Beclomethasone Dipropionate Place into the nose.   scopolamine 1 MG/3DAYS Commonly known as:  TRANSDERM-SCOP (1.5 MG) Place 1 patch (1.5 mg total) onto the skin every 3 (three) days.   sulfamethoxazole-trimethoprim 800-160 MG tablet Commonly known as:  BACTRIM DS Take 1 tablet by mouth 2 (two) times daily.   tamsulosin 0.4 MG Caps capsule Commonly known as:  FLOMAX Take 1 capsule (0.4 mg total) by mouth daily.   Vitamin D3 1.25 MG (50000 UT) Caps Take 50,000 Units by mouth once a week.   vitamin E 400 UNIT capsule Generic drug:  vitamin E Take 400 Units by mouth daily.       Past Surgical History:  She  has a past surgical history that includes Knee surgery (2002/2003); Rectal surgery (correction of prolapse); Colonoscopy; Esophageal manometry (N/A, 10/29/2015); and 24 hour ph study (N/A, 10/29/2015).  Family History:  Her family history includes Cancer in her paternal grandmother; Colon polyps in her father; Irritable bowel syndrome in her brother; Lung cancer in her maternal grandmother; Thyroid disease in her mother.  Social History:  She  reports that she has never smoked. She has never used smokeless tobacco. She reports that she does not drink alcohol or use drugs.

## 2018-12-21 ENCOUNTER — Ambulatory Visit: Payer: Managed Care, Other (non HMO) | Admitting: Physician Assistant

## 2018-12-21 ENCOUNTER — Telehealth: Payer: Self-pay | Admitting: Pulmonary Disease

## 2018-12-21 NOTE — Telephone Encounter (Signed)
Called and spoke with patient regarding results.  Informed the patient of results and recommendations today. Pt verbalized understanding and denied any questions or concerns at this time.  Nothing further needed.  

## 2018-12-21 NOTE — Telephone Encounter (Signed)
Dg Chest 2 View  Result Date: 12/21/2018 CLINICAL DATA:  Dyspnea on exertion EXAM: CHEST - 2 VIEW COMPARISON:  02/13/2014 FINDINGS: The heart size and mediastinal contours are within normal limits. Both lungs are clear. The visualized skeletal structures are unremarkable. IMPRESSION: No active cardiopulmonary disease. Electronically Signed   By: Marlan Palau M.D.   On: 12/21/2018 08:58     Please let her know that chest xray was normal.

## 2018-12-23 ENCOUNTER — Ambulatory Visit: Payer: Managed Care, Other (non HMO) | Admitting: Physician Assistant

## 2018-12-28 ENCOUNTER — Other Ambulatory Visit: Payer: Self-pay | Admitting: Nurse Practitioner

## 2019-01-04 ENCOUNTER — Other Ambulatory Visit: Payer: Self-pay | Admitting: Nurse Practitioner

## 2019-01-04 ENCOUNTER — Telehealth: Payer: Self-pay | Admitting: Nurse Practitioner

## 2019-01-04 MED ORDER — CEPHALEXIN 500 MG PO CAPS: 500.0000 mg | ORAL_CAPSULE | Freq: Three times a day (TID) | ORAL | 0 refills | Status: DC

## 2019-01-04 NOTE — Telephone Encounter (Signed)
An antibiotic sent to pharmacy

## 2019-01-04 NOTE — Telephone Encounter (Signed)
Patient called stating that her skin around her nails on left hand are red swollen and draining. She gets paranychias a lot. Needs antibiotic.

## 2019-01-05 ENCOUNTER — Encounter: Payer: Self-pay | Admitting: Nurse Practitioner

## 2019-01-05 ENCOUNTER — Ambulatory Visit (INDEPENDENT_AMBULATORY_CARE_PROVIDER_SITE_OTHER): Payer: Managed Care, Other (non HMO) | Admitting: Pulmonary Disease

## 2019-01-05 ENCOUNTER — Ambulatory Visit (INDEPENDENT_AMBULATORY_CARE_PROVIDER_SITE_OTHER): Payer: Managed Care, Other (non HMO) | Admitting: Nurse Practitioner

## 2019-01-05 VITALS — BP 116/78 | HR 114 | Ht 68.0 in | Wt 195.0 lb

## 2019-01-05 DIAGNOSIS — R0609 Other forms of dyspnea: Secondary | ICD-10-CM | POA: Diagnosis not present

## 2019-01-05 DIAGNOSIS — G4710 Hypersomnia, unspecified: Secondary | ICD-10-CM

## 2019-01-05 DIAGNOSIS — R0683 Snoring: Secondary | ICD-10-CM

## 2019-01-05 DIAGNOSIS — R0600 Dyspnea, unspecified: Secondary | ICD-10-CM

## 2019-01-05 LAB — PULMONARY FUNCTION TEST
DL/VA % pred: 122 %
DL/VA: 5.39 ml/min/mmHg/L
DLCO unc % pred: 86 %
DLCO unc: 21.9 ml/min/mmHg
FEF 25-75 Post: 3.32 L/sec
FEF 25-75 Pre: 3.21 L/sec
FEF2575-%Change-Post: 3 %
FEF2575-%Pred-Post: 92 %
FEF2575-%Pred-Pre: 89 %
FEV1-%CHANGE-POST: 5 %
FEV1-%PRED-PRE: 74 %
FEV1-%Pred-Post: 78 %
FEV1-Post: 2.76 L
FEV1-Pre: 2.61 L
FEV1FVC-%Change-Post: -9 %
FEV1FVC-%Pred-Pre: 105 %
FEV6-%Change-Post: 17 %
FEV6-%PRED-PRE: 70 %
FEV6-%Pred-Post: 82 %
FEV6-Post: 3.46 L
FEV6-Pre: 2.95 L
FEV6FVC-%Change-Post: 0 %
FEV6FVC-%Pred-Post: 101 %
FEV6FVC-%Pred-Pre: 101 %
FVC-%Change-Post: 17 %
FVC-%Pred-Post: 81 %
FVC-%Pred-Pre: 69 %
FVC-Post: 3.46 L
FVC-Pre: 2.95 L
POST FEV6/FVC RATIO: 100 %
PRE FEV6/FVC RATIO: 100 %
Post FEV1/FVC ratio: 80 %
Pre FEV1/FVC ratio: 88 %
RV % pred: 53 %
RV: 0.89 L
TLC % pred: 69 %
TLC: 3.95 L

## 2019-01-05 MED ORDER — BUDESONIDE-FORMOTEROL FUMARATE 80-4.5 MCG/ACT IN AERO: 2.0000 | INHALATION_SPRAY | Freq: Two times a day (BID) | RESPIRATORY_TRACT | 12 refills | Status: DC

## 2019-01-05 NOTE — Progress Notes (Signed)
PFT done today. 

## 2019-01-05 NOTE — Assessment & Plan Note (Addendum)
Patient's PFT in office today did show restriction/asthmatic component with post versus pre-bronch reactivity greater than 12%.  Patient will complete sleep study tonight.  Recent chest x-ray was clear.  Will trial low-dose Symbicort.  Patient Instructions  Will start on Symbicort 80/4.5 2 puffs BID - 2 puffs twice daily May continue ventolin as needed Patient to complete Sleep study this afternoon Chest x ray was clear Continue q nasl Follow up on Symbicort trial with Dr. Craige Cotta in 6 weeks or sooner if needed

## 2019-01-05 NOTE — Patient Instructions (Addendum)
Will start on Symbicort 80/4.5 2 puffs BID - 2 puffs twice daily May continue ventolin as needed Patient to complete Sleep study this afternoon Chest x ray was clear Continue q nasl Follow up on Symbicort trial with Dr. Craige Cotta in 6 weeks or sooner if needed

## 2019-01-05 NOTE — Progress Notes (Signed)
@Patient  ID: Autumn Davis, female    DOB: 08/18/1986, 33 y.o.   MRN: [id number]  Chief Complaint  Patient presents with  . Results    PFT - Dyspnea on Exertion    Referring provider: [name], *  HPI 33 year old female never smoker with dyspnea followed by Dr. Craige Cotta.  Tests: CXR 12/21/18 - No active cardiopulmonary disease.  Epworth 12/20/18 - score 12 out of 24  PFT: PFT Results Latest Ref Rng & Units 01/05/2019 05/05/2014  FVC-Pre L 2.95 1.79  FVC-Predicted Pre % 69 41  FVC-Post L 3.46 2.34  FVC-Predicted Post % 81 54  Pre FEV1/FVC % % 88 70  Post FEV1/FCV % % 80 69  FEV1-Pre L 2.61 1.26  FEV1-Predicted Pre % 74 35  FEV1-Post L 2.76 1.60  DLCO UNC% % 86 -  DLCO COR %Predicted % 122 -  TLC L 3.95 3.97  TLC % Predicted % 69 70  RV % Predicted % 53 79    OV 01/05/19 - follow up after PFT Patient presents today for follow-up on dyspnea.  She had a PFT today.  She was last seen by Dr. Craige Cotta on 12/20/2018.  She was ordered a chest x-ray which was clear.  She was also ordered a home sleep study which she will be completing tonight.  She still complains of intermittent shortness of breath.  We discussed her PFT in the office today.  PFT does show an asthmatic component.  Her PFT was improved from previous PFT in 2015 but patient states she has lost 42 pounds in the past year.  She states that she is still continue to work on weight loss and increasing exercise.  She denies any recent fever.  She denies any chest pain or edema.  Allergies  Allergen Reactions  . Ciprofloxacin Other (See Comments)    Muscle weakness and numbness  . Compazine Other (See Comments)    hallucinations  . Prochlorperazine Other (See Comments) and Rash    Pt states it makes her feel like things are crawling on her    Immunization History  Administered Date(s) Administered  . Influenza Split 08/27/2012  . Influenza,inj,Quad PF,6+ Mos 08/29/2013, 08/31/2014, 09/27/2015, 09/12/2016, 09/23/2017,  09/21/2018  . Pneumococcal Conjugate-13 08/31/2014  . Pneumococcal Polysaccharide-23 08/29/2013  . Tdap 02/23/2015    Past Medical History:  Diagnosis Date  . Allergy   . Arthritis    HANDS,HIPS,KNEES  . Bronchitis, chronic/intermittent 01/22/2012  . Depression 01/22/2012  . GERD (gastroesophageal reflux disease)   . Hyperlipidemia   . IBS (irritable bowel syndrome) 01/22/2012  . Lupus (HCC)   . Neuromuscular disorder (HCC)   . Osteoporosis   . Seizures (HCC)   . SOB (shortness of breath)   . Thyroid disease     Tobacco History: Social History   Tobacco Use  Smoking Status Never Smoker  Smokeless Tobacco Never Used   Counseling given: Not Answered   Outpatient Encounter Medications as of 01/05/2019  Medication Sig  . acetaZOLAMIDE (DIAMOX) 250 MG tablet TAKE 1 TABLET EVERY MORNING, TAKE 1 TABLET AT 2PM, AND TAKE 2 TABLETSAT BEDTIME  . amitriptyline (ELAVIL) 75 MG tablet Take 1 tablet (75 mg total) by mouth at bedtime.  Marland Kitchen atorvastatin (LIPITOR) 40 MG tablet TAKE ONE (1) TABLET EACH DAY  . baclofen (LIORESAL) 20 MG tablet TAKE ONE TABLET FOUR TIMES DAILY  . Calcium-Magnesium-Vitamin D (CALCIUM 1200+D3 PO) Take 1 tablet by mouth daily.  . cephALEXin (KEFLEX) 500 MG capsule Take 1 capsule (500  mg total) by mouth 3 (three) times daily.  . cetirizine (ZYRTEC) 10 MG tablet Take 10 mg by mouth daily.  . Cholecalciferol (VITAMIN D3) 50000 units CAPS Take 50,000 Units by mouth once a week.  . cyanocobalamin (,VITAMIN B-12,) 1000 MCG/ML injection 1ml im QW for 4 weeks then QOW for 4 weeks then monthly  . cycloSPORINE (RESTASIS) 0.05 % ophthalmic emulsion Place 1 drop into both eyes 2 (two) times daily.  Marland Kitchen dexlansoprazole (DEXILANT) 60 MG capsule TAKE ONE (1) CAPSULE EACH DAY  . diclofenac (VOLTAREN) 75 MG EC tablet TAKE ONE TABLET BY MOUTH TWICE DAILY  . docusate sodium (COLACE) 100 MG capsule Take 100 mg by mouth 2 (two) times daily. PRN  . EPINEPHRINE 0.3 mg/0.3 mL IJ SOAJ injection  USE AS DIRECTED  . FORTEO 600 MCG/2.4ML SOLN Inject 20 mcg into the skin at bedtime.  . furosemide (LASIX) 20 MG tablet Take 1 tablet (20 mg total) by mouth daily.  Marland Kitchen gabapentin (NEURONTIN) 600 MG tablet TAKE ONE TABLET FIVE TIMES DAILY  . ipratropium-albuterol (DUONEB) 0.5-2.5 (3) MG/3ML SOLN Take 3 mLs by nebulization every 4 (four) hours as needed.  Marland Kitchen L-Methylfolate 15 MG TABS Take 1 tablet (15 mg total) by mouth daily.  Marland Kitchen L-Methylfolate-Algae (L-METHYLFOLATE FORTE) 15-90.314 MG CAPS TAKE ONE (1) CAPSULE EACH DAY  . medroxyPROGESTERone (DEPO-PROVERA) 150 MG/ML injection Inject 150 mg into the muscle as directed. Every 2 months  . metoprolol succinate (TOPROL-XL) 25 MG 24 hr tablet TAKE ONE (1) TABLET EACH DAY  . montelukast (SINGULAIR) 10 MG tablet TAKE ONE TABLET DAILY AT BEDTIME  . mupirocin ointment (BACTROBAN) 2 % Apply 1 application topically 2 (two) times daily.  Marland Kitchen nystatin (MYCOSTATIN) 100000 UNIT/ML suspension   . nystatin-triamcinolone ointment (MYCOLOG) Apply 1 application topically 2 (two) times daily.  . Omega 3 1000 MG CAPS Take 1 capsule by mouth daily.  Marland Kitchen oxyCODONE-acetaminophen (PERCOCET) 7.5-325 MG tablet Take 1 tablet by mouth 5 (five) times daily as needed for moderate pain. No More Than 5 a day  . predniSONE (DELTASONE) 5 MG tablet TAKE ONE TABLET EVERY MORNING  . promethazine (PHENERGAN) 12.5 MG tablet Take 1 tablet (12.5 mg total) by mouth every 8 (eight) hours as needed for nausea or vomiting.  . promethazine (PHENERGAN) 25 MG tablet TAKE 1/2 TABLET EVERY 8 HOURS AS NEEDED FOR NAUSEA AND VOMITING  . QNASL 80 MCG/ACT AERS USE 2 SPRAYS IN EACH NOSTRIL DAILY  . scopolamine (TRANSDERM-SCOP, 1.5 MG,) 1 MG/3DAYS Place 1 patch (1.5 mg total) onto the skin every 3 (three) days.  . tamsulosin (FLOMAX) 0.4 MG CAPS capsule Take 1 capsule (0.4 mg total) by mouth daily.  . VENTOLIN HFA 108 (90 Base) MCG/ACT inhaler USE 2 PUFFS EVERY 4 TO 6 HOURS AS NEEDED  . vitamin E (VITAMIN E)  400 UNIT capsule Take 400 Units by mouth daily.  . budesonide-formoterol (SYMBICORT) 80-4.5 MCG/ACT inhaler Inhale 2 puffs into the lungs 2 (two) times daily for 30 days.  . [DISCONTINUED] Miconazole 50 MG TABS Place 1 tablet (50 mg total) inside cheek daily. (Patient not taking: Reported on 01/05/2019)  . [DISCONTINUED] sulfamethoxazole-trimethoprim (BACTRIM DS) 800-160 MG tablet Take 1 tablet by mouth 2 (two) times daily. (Patient not taking: Reported on 01/05/2019)   Facility-Administered Encounter Medications as of 01/05/2019  Medication  . medroxyPROGESTERone (DEPO-PROVERA) injection 150 mg     Review of Systems  Review of Systems  Constitutional: Negative.  Negative for chills and fever.  HENT: Negative.   Respiratory:  Positive for shortness of breath. Negative for cough.   Cardiovascular: Negative.  Negative for chest pain, palpitations and leg swelling.  Gastrointestinal: Negative.   Allergic/Immunologic: Negative.   Neurological: Negative.   Psychiatric/Behavioral: Negative.        Physical Exam  BP 116/78 (BP Location: Left Arm, Patient Position: Sitting, Cuff Size: Normal)   Pulse (!) 114   Ht 5\' 8"  (1.727 m)   Wt 195 lb (88.5 kg)   SpO2 98%   BMI 29.65 kg/m   Wt Readings from Last 5 Encounters:  01/05/19 195 lb (88.5 kg)  12/20/18 192 lb (87.1 kg)  12/13/18 195 lb (88.5 kg)  11/15/18 195 lb (88.5 kg)  11/05/18 196 lb (88.9 kg)     Physical Exam Vitals signs and nursing note reviewed.  Constitutional:      General: She is not in acute distress.    Appearance: She is well-developed.  Cardiovascular:     Rate and Rhythm: Normal rate and regular rhythm.  Pulmonary:     Effort: Pulmonary effort is normal. No respiratory distress.     Breath sounds: Normal breath sounds. No wheezing or rhonchi.  Musculoskeletal:        General: No swelling.  Neurological:     Mental Status: She is alert and oriented to person, place, and time.      Imaging: Dg Chest  2 View  Result Date: 12/21/2018 CLINICAL DATA:  Dyspnea on exertion EXAM: CHEST - 2 VIEW COMPARISON:  02/13/2014 FINDINGS: The heart size and mediastinal contours are within normal limits. Both lungs are clear. The visualized skeletal structures are unremarkable. IMPRESSION: No active cardiopulmonary disease. Electronically Signed   By: Marlan Palau M.D.   On: 12/21/2018 08:58     Assessment & Plan:   Dyspnea on exertion Patient's PFT in office today did show restriction/asthmatic component with post versus pre-bronch reactivity greater than 12%.  Patient will complete sleep study tonight.  Recent chest x-ray was clear.  Will trial low-dose Symbicort.  Patient Instructions  Will start on Symbicort 80/4.5 2 puffs BID - 2 puffs twice daily May continue ventolin as needed Patient to complete Sleep study this afternoon Chest x ray was clear Continue q nasl Follow up on Symbicort trial with Dr. Craige Cotta in 6 weeks or sooner if needed       Ivonne Andrew, NP 01/05/2019

## 2019-01-10 ENCOUNTER — Encounter: Payer: Managed Care, Other (non HMO) | Attending: Registered Nurse | Admitting: Registered Nurse

## 2019-01-10 VITALS — BP 119/81 | HR 80 | Ht 67.5 in | Wt 197.0 lb

## 2019-01-10 DIAGNOSIS — M546 Pain in thoracic spine: Secondary | ICD-10-CM | POA: Diagnosis not present

## 2019-01-10 DIAGNOSIS — M329 Systemic lupus erythematosus, unspecified: Secondary | ICD-10-CM | POA: Insufficient documentation

## 2019-01-10 DIAGNOSIS — M797 Fibromyalgia: Secondary | ICD-10-CM | POA: Insufficient documentation

## 2019-01-10 DIAGNOSIS — R569 Unspecified convulsions: Secondary | ICD-10-CM | POA: Insufficient documentation

## 2019-01-10 DIAGNOSIS — Z79891 Long term (current) use of opiate analgesic: Secondary | ICD-10-CM

## 2019-01-10 DIAGNOSIS — M545 Low back pain: Secondary | ICD-10-CM | POA: Diagnosis present

## 2019-01-10 DIAGNOSIS — Z5181 Encounter for therapeutic drug level monitoring: Secondary | ICD-10-CM

## 2019-01-10 DIAGNOSIS — M62838 Other muscle spasm: Secondary | ICD-10-CM | POA: Diagnosis not present

## 2019-01-10 DIAGNOSIS — G894 Chronic pain syndrome: Secondary | ICD-10-CM | POA: Diagnosis not present

## 2019-01-10 DIAGNOSIS — M6249 Contracture of muscle, multiple sites: Secondary | ICD-10-CM | POA: Diagnosis present

## 2019-01-10 DIAGNOSIS — G609 Hereditary and idiopathic neuropathy, unspecified: Secondary | ICD-10-CM

## 2019-01-10 DIAGNOSIS — D8989 Other specified disorders involving the immune mechanism, not elsewhere classified: Secondary | ICD-10-CM

## 2019-01-10 DIAGNOSIS — G8929 Other chronic pain: Secondary | ICD-10-CM

## 2019-01-10 MED ORDER — OXYCODONE-ACETAMINOPHEN 7.5-325 MG PO TABS: 1.0000 | ORAL_TABLET | Freq: Every day | ORAL | 0 refills | Status: DC | PRN

## 2019-01-10 NOTE — Progress Notes (Signed)
Subjective:    Patient ID: Autumn Davis, female    DOB: [birth year], 33 y.o.   MRN: [id number]  HPI: Autumn Davis is a 33 y.o. female who returns for follow up appointment for chronic pain and medication refill. She states her  pain is located in her mid back and bilateral lower extremities with tingling and burning. Also reports bilateral hand and bilateral feet pain with tingling and burning. She rates her pain 6. Her current exercise regime is walking and performing stretching exercises.  Autumn Davis Morphine equivalent is 56.25 MME.  Last Oral Swab was performed on 06/18/2018, it was consistent.   Pain Inventory Average Pain 9 Pain Right Now 6 My pain is constant, sharp, burning, stabbing and tingling  In the last 24 hours, has pain interfered with the following? General activity 7 Relation with others 7 Enjoyment of life 5 What TIME of day is your pain at its worst? evening Sleep (in general) Poor  Pain is worse with: walking, bending, sitting, inactivity, standing and some activites Pain improves with: rest, heat/ice, therapy/exercise, pacing activities, medication and TENS Relief from Meds: .  Mobility ability to climb steps?  yes do you drive?  no  Function I need assistance with the following:  meal prep, household duties and shopping  Neuro/Psych bladder control problems bowel control problems weakness numbness tremor tingling trouble walking spasms dizziness  Prior Studies Any changes since last visit?  no  Physicians involved in your care Any changes since last visit?  no   Family History  Problem Relation Age of Onset  . Thyroid disease Mother   . Colon polyps Father   . Lung cancer Maternal Grandmother   . Cancer Paternal Grandmother        colon/pancreatiec/lymphoma  . Irritable bowel syndrome Brother    Social History   Socioeconomic History  . Marital status: Married    Spouse name: Not on file  . Number of children: Not on file  . Years  of education: Not on file  . Highest education level: Not on file  Occupational History  . Occupation: disabled    Employer: [employer]  Social Needs  . Financial resource strain: Not on file  . Food insecurity:    Worry: Not on file    Inability: Not on file  . Transportation needs:    Medical: Not on file    Non-medical: Not on file  Tobacco Use  . Smoking status: Never Smoker  . Smokeless tobacco: Never Used  Substance and Sexual Activity  . Alcohol use: No    Alcohol/week: 0.0 standard drinks  . Drug use: No  . Sexual activity: Not on file  Lifestyle  . Physical activity:    Days per week: Not on file    Minutes per session: Not on file  . Stress: Not on file  Relationships  . Social connections:    Talks on phone: Not on file    Gets together: Not on file    Attends religious service: Not on file    Active member of club or organization: Not on file    Attends meetings of clubs or organizations: Not on file    Relationship status: Not on file  Other Topics Concern  . Not on file  Social History Narrative  . Not on file   Past Surgical History:  Procedure Laterality Date  . 24 HOUR PH STUDY N/A 10/29/2015   Procedure: 24 HOUR PH STUDY;  Surgeon: Maryjean Morn  Leone Payor, MD;  Location: [facility];  Service: Endoscopy;  Laterality: N/A;  . COLONOSCOPY    . ESOPHAGEAL MANOMETRY N/A 10/29/2015   Procedure: ESOPHAGEAL MANOMETRY (EM);  Surgeon: Iva Boop, MD;  Location: [facility];  Service: Endoscopy;  Laterality: N/A;  . KNEE SURGERY  2002/2003   bil  . RECTAL SURGERY  correction of prolapse   2010   Past Medical History:  Diagnosis Date  . Allergy   . Arthritis    HANDS,HIPS,KNEES  . Bronchitis, chronic/intermittent 01/22/2012  . Depression 01/22/2012  . GERD (gastroesophageal reflux disease)   . Hyperlipidemia   . IBS (irritable bowel syndrome) 01/22/2012  . Lupus (HCC)   . Neuromuscular disorder (HCC)   . Osteoporosis   . Seizures (HCC)   .  SOB (shortness of breath)   . Thyroid disease    BP 119/81   Pulse 80   Ht 5' 7.5" (1.715 m)   Wt 197 lb (89.4 kg)   SpO2 97%   BMI 30.40 kg/m   Opioid Risk Score:   Fall Risk Score:  `1  Depression screen PHQ 2/9  Depression screen [facility] 2/9 11/05/2018 08/13/2018 05/13/2018 04/22/2018 02/12/2018 02/09/2018 11/20/2017  Decreased Interest 1 0 1 0 0 1 1  Down, Depressed, Hopeless 0 0 0 0 0 0 1  PHQ - 2 Score 1 0 1 0 0 1 2  Altered sleeping - - - - 0 - -  Tired, decreased energy - - - - 0 - -  Change in appetite - - - - 0 - -  Feeling bad or failure about yourself  - - - - 0 - -  Trouble concentrating - - - - 0 - -  Moving slowly or fidgety/restless - - - - 0 - -  Suicidal thoughts - - - - 0 - -  PHQ-9 Score - - - - 0 - -  Some recent data might be hidden     Review of Systems  Constitutional: Negative.   HENT: Negative.   Eyes: Negative.   Respiratory: Negative.   Cardiovascular: Negative.   Gastrointestinal: Negative.   Endocrine: Negative.   Genitourinary: Positive for difficulty urinating.  Musculoskeletal: Positive for arthralgias, gait problem and myalgias.  Skin: Negative.   Allergic/Immunologic: Negative.   Neurological: Positive for dizziness, tremors, weakness and numbness.  Hematological: Negative.   Psychiatric/Behavioral: Negative.   All other systems reviewed and are negative.      Objective:   Physical Exam Vitals signs and nursing note reviewed.  Constitutional:      Appearance: Normal appearance.  Neck:     Musculoskeletal: Normal range of motion and neck supple.  Cardiovascular:     Rate and Rhythm: Normal rate and regular rhythm.     Pulses: Normal pulses.     Heart sounds: Normal heart sounds.  Pulmonary:     Effort: Pulmonary effort is normal.     Breath sounds: Normal breath sounds.  Musculoskeletal:     Comments: Normal Muscle Bulk and Muscle Testing Reveals:  Upper Extremities: Full ROM and Muscle Strength 5/5 Thoracic Paraspinal  Tenderness: T-7-T-9 Lower Extremities: Full ROM and Muscle Strength 5/5 Arises from Table with ease Narrow Based Gait   Skin:    General: Skin is warm and dry.  Neurological:     Mental Status: She is alert and oriented to person, place, and time.  Psychiatric:        Mood and Affect: Mood normal.  Behavior: Behavior normal.           Assessment & Plan:  1. Chronic seizure disorder: No seizure's. Continuecurrent medication regimen withGabapentin. Neurology Following.01/10/2019. 2. Chronic muscle spasms, weakness with associated pain disorder: Continuecurrent medication regimen withBaclofen. Continue with Exercise regime.01/10/2019. 3. Chronic dysphagia: GI Following.01/10/2019. 4. Anxiety with depression : Stable. Continuecurrent medication regimen withElavil.01/10/2019 5. Fibromyalgia/ Rib Pain/Chronic Pain:Continue Diclofenac: Continue with exercise and heat Therapy.01/10/2019. Continue: oxyCODONE 7.5/325mg  onetablet 5 times daily as needed #150. We will continue opioid monitoring program, this consists of regular clinic visits, examinations, urine drug screen, pill counts as well as use of [place] Controlled Substance Reporting System. 6. Peripheral Neuropathy:Continuecurrent medication regimen withGabapentin: 01/10/2019  20 minutes of face to face patient care time was spent during this visit. All questions were encouraged and answered.  F/U in 1 month

## 2019-01-10 NOTE — Progress Notes (Signed)
Reviewed and agree with assessment/plan.   Kate Sweetman, MD [facility] 11/12/2016, 12:24 PM Pager:  [phone]  

## 2019-01-12 ENCOUNTER — Ambulatory Visit (INDEPENDENT_AMBULATORY_CARE_PROVIDER_SITE_OTHER): Payer: Managed Care, Other (non HMO) | Admitting: Physician Assistant

## 2019-01-12 ENCOUNTER — Encounter: Payer: Self-pay | Admitting: Physician Assistant

## 2019-01-12 VITALS — BP 134/84 | HR 87 | Ht 67.0 in | Wt 197.0 lb

## 2019-01-12 DIAGNOSIS — R194 Change in bowel habit: Secondary | ICD-10-CM

## 2019-01-12 DIAGNOSIS — R1032 Left lower quadrant pain: Secondary | ICD-10-CM

## 2019-01-12 DIAGNOSIS — R1031 Right lower quadrant pain: Secondary | ICD-10-CM | POA: Diagnosis not present

## 2019-01-12 DIAGNOSIS — K219 Gastro-esophageal reflux disease without esophagitis: Secondary | ICD-10-CM

## 2019-01-12 DIAGNOSIS — K640 First degree hemorrhoids: Secondary | ICD-10-CM

## 2019-01-12 DIAGNOSIS — K921 Melena: Secondary | ICD-10-CM | POA: Diagnosis not present

## 2019-01-12 MED ORDER — HYDROCORTISONE ACETATE 25 MG RE SUPP: 25.0000 mg | Freq: Two times a day (BID) | RECTAL | 1 refills | Status: AC

## 2019-01-12 MED ORDER — PANTOPRAZOLE SODIUM 40 MG PO TBEC: 40.0000 mg | DELAYED_RELEASE_TABLET | Freq: Two times a day (BID) | ORAL | 2 refills | Status: DC

## 2019-01-12 NOTE — Progress Notes (Addendum)
Chief Complaint: Rectal bleeding  HPI:    [name] is a 33 year old Caucasian female with a past medical history as listed below including chronic seizure disorder, fibromyalgia, chronic pain on oxycodone, who is known to Dr. Leone Payor, who was referred to me by Daphine Deutscher, Mary-Margaret, * for a complaint of rectal bleeding.      09/24/2015 patient seen in clinic for rectal bleeding and dysphagia.  At that office visit, described intermittent throat tightening and swallowing problems with food over the past 1/2 years, was on Prilosec with incomplete relief of heartburn.  Also history of rectal prolapse and surgical correction but described mucoid bloody discharge which is intermittent with some discomfort.  Was on Colace to help with defecation.  Described remote colonoscopy which was apparently negative.  Rectal exam revealed small RA tag, and anoscopy showed grade 1 inflamed internal hemorrhoids; Plan: Scheduled for EGD and told to start Benefiber to treat symptomatic internal hemorrhoids.    09/28/15 EGD was normal.  Patient had esophageal manometry 10/29/2015 with evidence of hypercontractile esophageal sphincter.    Today, patient presents to clinic accompanied by her mother and young son.  She explains that she has had IBS since she was very young but typically was more constipated, now over the past 3 to 4 months has noticed a change towards diarrhea radiating with constipation off and on every other week or so.  Along with this has noticed change in consistency of her stools with an increase of mucus and some bright red blood.  Tells me she has seen blood before and blamed this on hemorrhoids, but is not sure that this is it anymore.  Does use Colace when constipated typically 3 a day.  Along with this experiencing some bilateral lower abdominal cramping typically before bowel movement relieved afterwards.    Also tells me that recently her insurance stopped paying for her Dexilant which was working  very well to control her reflux symptoms, now over the past month or so has had an increase in symptoms specifically when bending over, currently on Pantoprazole 40 mg daily.    Family history of appendiceal cancer in her mother with spread to her colon.    Denies fever, chills, weight loss, anorexia, nausea, vomiting or symptoms that awaken her from sleep.  Past Medical History:  Diagnosis Date  . Allergy   . Arthritis    HANDS,HIPS,KNEES  . Bronchitis, chronic/intermittent 01/22/2012  . Depression 01/22/2012  . GERD (gastroesophageal reflux disease)   . Hyperlipidemia   . IBS (irritable bowel syndrome) 01/22/2012  . Lupus (HCC)   . Neuromuscular disorder (HCC)   . Osteoporosis   . Seizures (HCC)   . SOB (shortness of breath)   . Thyroid disease     Past Surgical History:  Procedure Laterality Date  . 24 HOUR PH STUDY N/A 10/29/2015   Procedure: 24 HOUR PH STUDY;  Surgeon: Iva Boop, MD;  Location: [facility];  Service: Endoscopy;  Laterality: N/A;  . COLONOSCOPY    . ESOPHAGEAL MANOMETRY N/A 10/29/2015   Procedure: ESOPHAGEAL MANOMETRY (EM);  Surgeon: Iva Boop, MD;  Location: [facility];  Service: Endoscopy;  Laterality: N/A;  . KNEE SURGERY  2002/2003   bil  . RECTAL SURGERY  correction of prolapse   2010    Current Outpatient Medications  Medication Sig Dispense Refill  . acetaZOLAMIDE (DIAMOX) 250 MG tablet TAKE 1 TABLET EVERY MORNING, TAKE 1 TABLET AT 2PM, AND TAKE 2 TABLETSAT BEDTIME 360 tablet 1  .  amitriptyline (ELAVIL) 75 MG tablet Take 1 tablet (75 mg total) by mouth at bedtime. 90 tablet 3  . atorvastatin (LIPITOR) 40 MG tablet TAKE ONE (1) TABLET EACH DAY 90 tablet 1  . baclofen (LIORESAL) 20 MG tablet TAKE ONE TABLET FOUR TIMES DAILY 360 each 1  . budesonide-formoterol (SYMBICORT) 80-4.5 MCG/ACT inhaler Inhale 2 puffs into the lungs 2 (two) times daily for 30 days. 1 Inhaler 12  . Calcium-Magnesium-Vitamin D (CALCIUM 1200+D3 PO) Take 1 tablet by mouth  daily.    . cephALEXin (KEFLEX) 500 MG capsule Take 1 capsule (500 mg total) by mouth 3 (three) times daily. 30 capsule 0  . cetirizine (ZYRTEC) 10 MG tablet Take 10 mg by mouth daily.    . Cholecalciferol (VITAMIN D3) 50000 units CAPS Take 50,000 Units by mouth once a week.    . cyanocobalamin (,VITAMIN B-12,) 1000 MCG/ML injection 1ml im QW for 4 weeks then QOW for 4 weeks then monthly 30 mL 2  . cycloSPORINE (RESTASIS) 0.05 % ophthalmic emulsion Place 1 drop into both eyes 2 (two) times daily.    Marland Kitchen dexlansoprazole (DEXILANT) 60 MG capsule TAKE ONE (1) CAPSULE EACH DAY 90 capsule 1  . diclofenac (VOLTAREN) 75 MG EC tablet TAKE ONE TABLET BY MOUTH TWICE DAILY 180 tablet 0  . docusate sodium (COLACE) 100 MG capsule Take 100 mg by mouth 2 (two) times daily. PRN    . EPINEPHRINE 0.3 mg/0.3 mL IJ SOAJ injection USE AS DIRECTED 2 Device 2  . FORTEO 600 MCG/2.4ML SOLN Inject 20 mcg into the skin at bedtime.  11  . furosemide (LASIX) 20 MG tablet Take 1 tablet (20 mg total) by mouth daily. 90 tablet 1  . gabapentin (NEURONTIN) 600 MG tablet TAKE ONE TABLET FIVE TIMES DAILY 450 tablet 2  . ipratropium-albuterol (DUONEB) 0.5-2.5 (3) MG/3ML SOLN Take 3 mLs by nebulization every 4 (four) hours as needed. 360 mL 4  . L-Methylfolate 15 MG TABS Take 1 tablet (15 mg total) by mouth daily. 90 tablet 1  . L-Methylfolate-Algae (L-METHYLFOLATE FORTE) 15-90.314 MG CAPS TAKE ONE (1) CAPSULE EACH DAY 30 capsule 2  . medroxyPROGESTERone (DEPO-PROVERA) 150 MG/ML injection Inject 150 mg into the muscle as directed. Every 2 months    . metoprolol succinate (TOPROL-XL) 25 MG 24 hr tablet TAKE ONE (1) TABLET EACH DAY 90 tablet 1  . montelukast (SINGULAIR) 10 MG tablet TAKE ONE TABLET DAILY AT BEDTIME 90 tablet 3  . mupirocin ointment (BACTROBAN) 2 % Apply 1 application topically 2 (two) times daily. 22 g 0  . nystatin (MYCOSTATIN) 100000 UNIT/ML suspension   2  . nystatin-triamcinolone ointment (MYCOLOG) Apply 1  application topically 2 (two) times daily. 30 g 0  . Omega 3 1000 MG CAPS Take 1 capsule by mouth daily.    Marland Kitchen oxyCODONE-acetaminophen (PERCOCET) 7.5-325 MG tablet Take 1 tablet by mouth 5 (five) times daily as needed for moderate pain. No More Than 5 a day 150 tablet 0  . predniSONE (DELTASONE) 5 MG tablet TAKE ONE TABLET EVERY MORNING 90 tablet 1  . promethazine (PHENERGAN) 12.5 MG tablet Take 1 tablet (12.5 mg total) by mouth every 8 (eight) hours as needed for nausea or vomiting. 30 tablet 0  . promethazine (PHENERGAN) 25 MG tablet TAKE 1/2 TABLET EVERY 8 HOURS AS NEEDED FOR NAUSEA AND VOMITING 15 tablet 0  . QNASL 80 MCG/ACT AERS USE 2 SPRAYS IN EACH NOSTRIL DAILY 8.7 g 3  . scopolamine (TRANSDERM-SCOP, 1.5 MG,) 1 MG/3DAYS Place  1 patch (1.5 mg total) onto the skin every 3 (three) days. 4 patch 1  . tamsulosin (FLOMAX) 0.4 MG CAPS capsule Take 1 capsule (0.4 mg total) by mouth daily. 90 capsule 1  . VENTOLIN HFA 108 (90 Base) MCG/ACT inhaler USE 2 PUFFS EVERY 4 TO 6 HOURS AS NEEDED 18 g 3  . vitamin E (VITAMIN E) 400 UNIT capsule Take 400 Units by mouth daily.     Current Facility-Administered Medications  Medication Dose Route Frequency Provider Last Rate Last Dose  . medroxyPROGESTERone (DEPO-PROVERA) injection 150 mg  150 mg Intramuscular Q90 days [name], FNP   150 mg at 11/05/18 1656    Allergies as of 01/12/2019 - Review Complete 01/10/2019  Allergen Reaction Noted  . Ciprofloxacin Other (See Comments) 11/20/2016  . Compazine Other (See Comments) 12/18/2011  . Prochlorperazine Other (See Comments) and Rash 07/14/2012    Family History  Problem Relation Age of Onset  . Thyroid disease Mother   . Colon polyps Father   . Lung cancer Maternal Grandmother   . Cancer Paternal Grandmother        colon/pancreatiec/lymphoma  . Irritable bowel syndrome Brother     Social History   Socioeconomic History  . Marital status: Married    Spouse name: Not on file  .  Number of children: Not on file  . Years of education: Not on file  . Highest education level: Not on file  Occupational History  . Occupation: disabled    Employer: [employer]  Social Needs  . Financial resource strain: Not on file  . Food insecurity:    Worry: Not on file    Inability: Not on file  . Transportation needs:    Medical: Not on file    Non-medical: Not on file  Tobacco Use  . Smoking status: Never Smoker  . Smokeless tobacco: Never Used  Substance and Sexual Activity  . Alcohol use: No    Alcohol/week: 0.0 standard drinks  . Drug use: No  . Sexual activity: Not on file  Lifestyle  . Physical activity:    Days per week: Not on file    Minutes per session: Not on file  . Stress: Not on file  Relationships  . Social connections:    Talks on phone: Not on file    Gets together: Not on file    Attends religious service: Not on file    Active member of club or organization: Not on file    Attends meetings of clubs or organizations: Not on file    Relationship status: Not on file  . Intimate partner violence:    Fear of current or ex partner: Not on file    Emotionally abused: Not on file    Physically abused: Not on file    Forced sexual activity: Not on file  Other Topics Concern  . Not on file  Social History Narrative  . Not on file    Review of Systems:    Constitutional: No weight loss, fever or chills Skin: No rash  Cardiovascular: No chest pain Respiratory: No SOB  Gastrointestinal: See HPI and otherwise negative Genitourinary: No dysuria  Neurological: No headache, dizziness or syncope Musculoskeletal: No new muscle or joint pain Hematologic: No bruising Psychiatric: +depression   Physical Exam:  Vital signs: BP 134/84   Pulse 87   Ht  (1.702 m)   Wt 197 lb (89.4 kg)   BMI 30.85 kg/m   Constitutional:   Pleasant  overweight Caucasian female appears to be in NAD, Well developed, Well nourished, alert and  cooperative Head:  Normocephalic and atraumatic. Eyes:   PEERL, EOMI. No icterus. Conjunctiva pink. Ears:  Normal auditory acuity. Neck:  Supple Throat: Oral cavity and pharynx without inflammation, swelling or lesion.  Respiratory: Respirations even and unlabored. Lungs clear to auscultation bilaterally.   No wheezes, crackles, or rhonchi.  Cardiovascular: Normal S1, S2. No MRG. Regular rate and rhythm. No peripheral edema, cyanosis or pallor.  Gastrointestinal:  Soft, nondistended, mild LLQ ttp, mild epigastric ttp. No rebound or guarding. Normal bowel sounds. No appreciable masses or hepatomegaly. Rectal:  External: no lesion, no tenderness; Internal: normal sphincter tone, no ttp, no discharge; Anoscopy: Grade II internal hemorrhoids Msk:  Symmetrical without gross deformities. Without edema, no deformity or joint abnormality.  Neurologic:  Alert and  oriented x4;  grossly normal neurologically.  Skin:   Dry and intact without significant lesions or rashes. Psychiatric: Demonstrates good judgement and reason without abnormal affect or behaviors.  RELEVANT LABS AND IMAGING: CBC    Component Value Date/Time   WBC 9.1 08/13/2018 1542   WBC 9.3 02/13/2014 1637   WBC 9.8 01/10/2013 2317   RBC 4.25 08/13/2018 1542   RBC 4.7 02/13/2014 1637   RBC 4.03 01/10/2013 2317   HGB 12.4 08/13/2018 1542   HCT 38.7 08/13/2018 1542   PLT 292 08/13/2018 1542   MCV 91 08/13/2018 1542   MCH 29.2 08/13/2018 1542   MCH 28.6 02/13/2014 1637   MCH 30.0 01/10/2013 2317   MCHC 32.0 08/13/2018 1542   MCHC 32.3 02/13/2014 1637   MCHC 34.2 01/10/2013 2317   RDW 14.0 08/13/2018 1542   LYMPHSABS 1.6 08/13/2018 1542   MONOABS 0.6 01/10/2013 2317   EOSABS 0.0 08/13/2018 1542   BASOSABS 0.1 08/13/2018 1542    CMP     Component Value Date/Time   NA 146 (H) 11/05/2018 1648   K 3.4 (L) 11/05/2018 1648   CL 111 (H) 11/05/2018 1648   CO2 20 11/05/2018 1648   GLUCOSE 93 11/05/2018 1648   GLUCOSE 98  05/25/2013 0859   BUN 12 11/05/2018 1648   CREATININE 0.78 11/05/2018 1648   CREATININE 0.79 05/25/2013 0859   CALCIUM 9.4 11/05/2018 1648   PROT 6.9 11/05/2018 1648   ALBUMIN 4.7 11/05/2018 1648   AST 15 11/05/2018 1648   ALT 20 11/05/2018 1648   ALKPHOS 85 11/05/2018 1648   BILITOT 0.3 11/05/2018 1648   GFRNONAA 99 11/05/2018 1648   GFRNONAA >89 05/25/2013 0859   GFRAA 115 11/05/2018 1648   GFRAA >89 05/25/2013 0859    Assessment: 1.  Change in bowel habits: Now radiating from diarrhea to constipation with some increase in mucus; likely IBS, last colonoscopy 8 years ago with Dr. Loreta Ave 2.  Bilateral lower abdominal pain: Typically prior to bowel movement and usually relieved afterwards; consistent with IBS 3.  GERD: Uncontrolled on Pantoprazole 40 mg daily, previously controlled on Dexilant 60 mg daily but insurance stopped paying 4.  Bright red blood per rectum: With hemorrhoids as below 5.  Internal hemorrhoids: Seen at time of exam today, likely cause of bright red blood per rectum  Plan: 1.  Prescribed Hydrocortisone suppositories twice daily x7 days with 1 refill 2.  Would recommend the patient increase fiber in her diet to least 25-35 g/day with use of a fiber supplement and/or through her diet.  Discussed Metamucil, Citrucel or Benefiber. 3.  Increase water intake daily 6-8 eight ounce glasses  of water per day 4.  Start Align probiotic once daily over the next 2 months.  Provided coupon. 5.  Increase Pantoprazole to 40 mg twice daily, 30 to 60 minutes for breakfast and dinner #60 with 2 refills.  Discussed with patient that if this is not helping over the next 2 weeks then she can call and we will try to get preapproval for Dexilant which was helping her. 6.  Patient to follow in clinic in 2 months with Dr. Leone Payor or myself.  If no improvement could consider repeat colonoscopy.  Hyacinth Meeker, PA-C [facility] 01/12/2019, 2:04 PM  Cc: Daphine Deutscher, Mary-Margaret, *    Agree with Ms. Lenard Simmer evaluation and management.  Iva Boop, MD, [facility]

## 2019-01-12 NOTE — Patient Instructions (Addendum)
If you are age 35 or older, your body mass index should be between 23-30. Your Body mass index is 30.85 kg/m. If this is out of the aforementioned range listed, please consider follow up with your Primary Care Provider.  If you are age 61 or younger, your body mass index should be between 19-25. Your Body mass index is 30.85 kg/m. If this is out of the aformentioned range listed, please consider follow up with your Primary Care Provider.   You should get 25-35 grams of fiber a day through your diet and with supplementation.  Drink 6 to 8 eight ounce glasses of water a day.  Take Align every day for 2 months.  We have sent the following medications to your pharmacy for you to pick up at your convenience: Pantoprazole 40mg : Take twice day  Call us in 2 weeks if you aren't feeling better.   Please call us next month to schedule an appointment with Dr. Leone Payor or myself.  Thank you for entrusting me with your care and for choosing [facility], Hyacinth Meeker, [place]

## 2019-01-13 ENCOUNTER — Telehealth: Payer: Self-pay

## 2019-01-13 ENCOUNTER — Encounter: Payer: Self-pay | Admitting: Registered Nurse

## 2019-01-13 NOTE — Telephone Encounter (Signed)
Protonix 40mg  bid requires a prior auth.  PA submitted via CoverMyMeds on 2-26.

## 2019-01-19 ENCOUNTER — Telehealth: Payer: Self-pay | Admitting: Pulmonary Disease

## 2019-01-19 DIAGNOSIS — G4710 Hypersomnia, unspecified: Secondary | ICD-10-CM

## 2019-01-19 NOTE — Telephone Encounter (Signed)
Prior auth denied for Protonix 40mg  bid.

## 2019-01-19 NOTE — Telephone Encounter (Signed)
HST 01/05/19 >> AHI 3.4, SpO2 low 83%   Please let her know that her home sleep study did not show sleep apnea.    Will discuss in more detail at next ROV.

## 2019-01-21 ENCOUNTER — Telehealth: Payer: Self-pay

## 2019-01-21 NOTE — Telephone Encounter (Signed)
Spoke with pt and notified of results per Dr. Sood. Pt verbalized understanding and denied any questions.  

## 2019-01-21 NOTE — Telephone Encounter (Signed)
-----   Message from Unk Lightning, [place] sent at 01/20/2019  3:14 PM EST ----- Regarding: RE: Pantoprazole Can you see what PPI they would cover?  Any of them twice a day?  Thanks-JLL ----- Message ----- From: Evonnie Pat, RMA Sent: 01/20/2019   3:07 PM EST To: Unk Lightning, PA Subject: Pantoprazole                                   Received letter from [facility] stating that patients plan " simply does not cover this medication, no matter what the reason is that it is being requested."  Please advise. Thanks, Malachi Bonds, RMA (letter scanned to chart)

## 2019-01-21 NOTE — Telephone Encounter (Signed)
Spoke with patient this morning.  Patient is going to call insurance company to find out what they will cover to take twice daily.  Patient will let us know.

## 2019-01-28 ENCOUNTER — Other Ambulatory Visit: Payer: Self-pay

## 2019-01-28 ENCOUNTER — Ambulatory Visit: Payer: Managed Care, Other (non HMO) | Admitting: Nurse Practitioner

## 2019-01-28 ENCOUNTER — Ambulatory Visit (INDEPENDENT_AMBULATORY_CARE_PROVIDER_SITE_OTHER): Payer: Managed Care, Other (non HMO) | Admitting: *Deleted

## 2019-01-28 DIAGNOSIS — Z30013 Encounter for initial prescription of injectable contraceptive: Secondary | ICD-10-CM | POA: Diagnosis not present

## 2019-01-28 DIAGNOSIS — Z309 Encounter for contraceptive management, unspecified: Secondary | ICD-10-CM

## 2019-02-03 ENCOUNTER — Ambulatory Visit: Payer: Medicare Other | Admitting: Nurse Practitioner

## 2019-02-05 ENCOUNTER — Other Ambulatory Visit: Payer: Self-pay | Admitting: Nurse Practitioner

## 2019-02-05 MED ORDER — PREDNISONE 10 MG (21) PO TBPK: ORAL_TABLET | ORAL | 0 refills | Status: DC

## 2019-02-07 ENCOUNTER — Other Ambulatory Visit: Payer: Self-pay | Admitting: Nurse Practitioner

## 2019-02-11 ENCOUNTER — Other Ambulatory Visit: Payer: Self-pay

## 2019-02-11 ENCOUNTER — Encounter: Payer: Self-pay | Admitting: Registered Nurse

## 2019-02-11 ENCOUNTER — Encounter: Payer: Managed Care, Other (non HMO) | Attending: Registered Nurse | Admitting: Registered Nurse

## 2019-02-11 VITALS — HR 89 | Ht 67.5 in | Wt 198.0 lb

## 2019-02-11 DIAGNOSIS — M797 Fibromyalgia: Secondary | ICD-10-CM

## 2019-02-11 DIAGNOSIS — Z5181 Encounter for therapeutic drug level monitoring: Secondary | ICD-10-CM

## 2019-02-11 DIAGNOSIS — G894 Chronic pain syndrome: Secondary | ICD-10-CM

## 2019-02-11 DIAGNOSIS — G609 Hereditary and idiopathic neuropathy, unspecified: Secondary | ICD-10-CM

## 2019-02-11 DIAGNOSIS — Z79891 Long term (current) use of opiate analgesic: Secondary | ICD-10-CM

## 2019-02-11 DIAGNOSIS — M546 Pain in thoracic spine: Secondary | ICD-10-CM

## 2019-02-11 DIAGNOSIS — G8929 Other chronic pain: Secondary | ICD-10-CM

## 2019-02-11 DIAGNOSIS — R569 Unspecified convulsions: Secondary | ICD-10-CM

## 2019-02-11 DIAGNOSIS — M545 Low back pain: Secondary | ICD-10-CM | POA: Insufficient documentation

## 2019-02-11 DIAGNOSIS — M6249 Contracture of muscle, multiple sites: Secondary | ICD-10-CM | POA: Insufficient documentation

## 2019-02-11 DIAGNOSIS — M62838 Other muscle spasm: Secondary | ICD-10-CM | POA: Diagnosis not present

## 2019-02-11 DIAGNOSIS — M329 Systemic lupus erythematosus, unspecified: Secondary | ICD-10-CM | POA: Insufficient documentation

## 2019-02-11 DIAGNOSIS — D8989 Other specified disorders involving the immune mechanism, not elsewhere classified: Secondary | ICD-10-CM

## 2019-02-11 MED ORDER — OXYCODONE-ACETAMINOPHEN 7.5-325 MG PO TABS: 1.0000 | ORAL_TABLET | Freq: Every day | ORAL | 0 refills | Status: DC | PRN

## 2019-02-11 NOTE — Progress Notes (Signed)
Subjective:    Patient ID: Autumn Davis, female    DOB: [birth year], 33 y.o.   MRN: [id number]  HPI: This provider placed a call to [name] is a 33 y.o. female, her appointment was changed to a virtual office visit to reduce the risk of exposure to the COVID-19 virus and to help her remain healthy and safe. The virtual visit will also provide continuity of care. She verbalizes understanding.  She states her pain is located in her mid back ( bilateral rib pain). She rates her pain 6.Her current exercise regime is walking and performing stretching exercises.  [name] reports she had an autoimmune flare up with a rash, she called her PCP an was prescribed steroid taper. She also reports she had a stomach virus in the beginning of the month, this has resolved. Also states she passed kidney stone on 02/08/2019, she has followed up with her PCP and will be scheduling an appointment with urologist she states.   [name] Morphine equivalent is 56.25 MME.  Last Oral Swab was Performed on 06/18/2018, it was consistent.   Pain Inventory Average Pain 6 Pain Right Now 6 My pain is sharp, burning and tingling  In the last 24 hours, has pain interfered with the following? General activity 8 Relation with others 7 Enjoyment of life 6 What TIME of day is your pain at its worst? night Sleep (in general) Poor  Pain is worse with: walking, bending, sitting, standing and some activites Pain improves with: rest, heat/ice, therapy/exercise and medication Relief from Meds: 5  Mobility walk without assistance walk with assistance use a cane  Function disabled: date disabled .  Neuro/Psych bladder control problems weakness tremor tingling trouble walking spasms dizziness  Prior Studies Any changes since last visit?  no  Physicians involved in your care Any changes since last visit?  no   Family History  Problem Relation Age of Onset  . Thyroid disease Mother   . Colon polyps  Father   . Lung cancer Maternal Grandmother   . Cancer Paternal Grandmother        colon/pancreatiec/lymphoma  . Irritable bowel syndrome Brother   . AAA (abdominal aortic aneurysm) Neg Hx    Social History   Socioeconomic History  . Marital status: Married    Spouse name: Not on file  . Number of children: Not on file  . Years of education: Not on file  . Highest education level: Not on file  Occupational History  . Occupation: disabled    Employer: [employer]  Social Needs  . Financial resource strain: Not on file  . Food insecurity:    Worry: Not on file    Inability: Not on file  . Transportation needs:    Medical: Not on file    Non-medical: Not on file  Tobacco Use  . Smoking status: Never Smoker  . Smokeless tobacco: Never Used  Substance and Sexual Activity  . Alcohol use: No    Alcohol/week: 0.0 standard drinks  . Drug use: No  . Sexual activity: Not on file  Lifestyle  . Physical activity:    Days per week: Not on file    Minutes per session: Not on file  . Stress: Not on file  Relationships  . Social connections:    Talks on phone: Not on file    Gets together: Not on file    Attends religious service: Not on file    Active member of club or  organization: Not on file    Attends meetings of clubs or organizations: Not on file    Relationship status: Not on file  Other Topics Concern  . Not on file  Social History Narrative  . Not on file   Past Surgical History:  Procedure Laterality Date  . 24 HOUR PH STUDY N/A 10/29/2015   Procedure: 24 HOUR PH STUDY;  Surgeon: Iva Boop, MD;  Location: [facility];  Service: Endoscopy;  Laterality: N/A;  . COLONOSCOPY    . ESOPHAGEAL MANOMETRY N/A 10/29/2015   Procedure: ESOPHAGEAL MANOMETRY (EM);  Surgeon: Iva Boop, MD;  Location: [facility];  Service: Endoscopy;  Laterality: N/A;  . KNEE SURGERY  2002/2003   bil  . RECTAL SURGERY  correction of prolapse   2010   Past Medical  History:  Diagnosis Date  . Allergy   . Arthritis    HANDS,HIPS,KNEES  . Bronchitis, chronic/intermittent 01/22/2012  . Depression 01/22/2012  . GERD (gastroesophageal reflux disease)   . Hyperlipidemia   . IBS (irritable bowel syndrome) 01/22/2012  . Lupus (HCC)   . Neuromuscular disorder (HCC)   . Osteoporosis   . Seizures (HCC)   . SOB (shortness of breath)   . Thyroid disease    Pulse 89 Comment: fit bit reading  Ht 5' 7.5" (1.715 m)   Wt 198 lb (89.8 kg)   BMI 30.55 kg/m   Opioid Risk Score:   Fall Risk Score:  `1  Depression screen PHQ 2/9  Depression screen [facility] 2/9 11/05/2018 08/13/2018 05/13/2018 04/22/2018 02/12/2018 02/09/2018 11/20/2017  Decreased Interest 1 0 1 0 0 1 1  Down, Depressed, Hopeless 0 0 0 0 0 0 1  PHQ - 2 Score 1 0 1 0 0 1 2  Altered sleeping - - - - 0 - -  Tired, decreased energy - - - - 0 - -  Change in appetite - - - - 0 - -  Feeling bad or failure about yourself  - - - - 0 - -  Trouble concentrating - - - - 0 - -  Moving slowly or fidgety/restless - - - - 0 - -  Suicidal thoughts - - - - 0 - -  PHQ-9 Score - - - - 0 - -  Some recent data might be hidden    Review of Systems  Constitutional: Negative.   HENT: Negative.   Eyes: Negative.   Respiratory: Negative.   Cardiovascular: Positive for leg swelling.  Gastrointestinal: Positive for abdominal pain.  Genitourinary: Positive for frequency and urgency.  Musculoskeletal: Positive for back pain and gait problem.  Skin: Negative.   Allergic/Immunologic: Negative.   Neurological: Positive for dizziness, weakness and numbness.       Tingling  Psychiatric/Behavioral: Negative.        Objective:   Physical Exam Nursing note reviewed.  Neurological:     Mental Status: She is oriented to person, place, and time.           Assessment & Plan:  1. Chronic seizure disorder: No seizure's. Continuecurrent medication regimen withGabapentin. Neurology Following.02/11/2019. 2. Chronic muscle  spasms, weakness with associated pain disorder: Continuecurrent medication regimen withBaclofen. Continue with Exercise regime.02/11/2019. 3. Chronic dysphagia: GI Following.02/11/2019. 4. Anxiety with depression : Stable. Continuecurrent medication regimen withElavil.02/11/2019 5. Fibromyalgia/ Rib Pain/Chronic Pain:Continue Diclofenac: Continue with exercise and heat Therapy.02/11/2019. Continue: oxyCODONE 7.5/325mg  onetablet 5 times daily as needed #150. We will continue opioid monitoring program, this consists of regular clinic visits, examinations, urine  drug screen, pill counts as well as use of [place] Controlled Substance Reporting System. 6. Peripheral Neuropathy:Continuecurrent medication regimen withGabapentin: 02/11/2019  20 minutes of face to face patient care time was spent during this visit. All questions were encouraged and answered.  F/U in 1 month

## 2019-02-21 ENCOUNTER — Ambulatory Visit: Payer: Managed Care, Other (non HMO) | Admitting: Pulmonary Disease

## 2019-03-17 ENCOUNTER — Encounter: Payer: Self-pay | Admitting: Registered Nurse

## 2019-03-17 ENCOUNTER — Encounter: Payer: Managed Care, Other (non HMO) | Attending: Registered Nurse | Admitting: Registered Nurse

## 2019-03-17 ENCOUNTER — Other Ambulatory Visit: Payer: Self-pay

## 2019-03-17 VITALS — HR 98 | Ht 67.5 in | Wt 198.0 lb

## 2019-03-17 DIAGNOSIS — Z5181 Encounter for therapeutic drug level monitoring: Secondary | ICD-10-CM

## 2019-03-17 DIAGNOSIS — M329 Systemic lupus erythematosus, unspecified: Secondary | ICD-10-CM | POA: Insufficient documentation

## 2019-03-17 DIAGNOSIS — R569 Unspecified convulsions: Secondary | ICD-10-CM

## 2019-03-17 DIAGNOSIS — G609 Hereditary and idiopathic neuropathy, unspecified: Secondary | ICD-10-CM

## 2019-03-17 DIAGNOSIS — M797 Fibromyalgia: Secondary | ICD-10-CM | POA: Diagnosis not present

## 2019-03-17 DIAGNOSIS — Z79891 Long term (current) use of opiate analgesic: Secondary | ICD-10-CM

## 2019-03-17 DIAGNOSIS — M6249 Contracture of muscle, multiple sites: Secondary | ICD-10-CM | POA: Insufficient documentation

## 2019-03-17 DIAGNOSIS — M545 Low back pain: Secondary | ICD-10-CM | POA: Insufficient documentation

## 2019-03-17 DIAGNOSIS — M62838 Other muscle spasm: Secondary | ICD-10-CM

## 2019-03-17 DIAGNOSIS — G894 Chronic pain syndrome: Secondary | ICD-10-CM

## 2019-03-17 DIAGNOSIS — M546 Pain in thoracic spine: Secondary | ICD-10-CM

## 2019-03-17 DIAGNOSIS — D8989 Other specified disorders involving the immune mechanism, not elsewhere classified: Secondary | ICD-10-CM

## 2019-03-17 DIAGNOSIS — G8929 Other chronic pain: Secondary | ICD-10-CM

## 2019-03-17 MED ORDER — OXYCODONE-ACETAMINOPHEN 7.5-325 MG PO TABS: 1.0000 | ORAL_TABLET | Freq: Every day | ORAL | 0 refills | Status: DC | PRN

## 2019-03-17 NOTE — Progress Notes (Signed)
Subjective:    Patient ID: Autumn Davis, female    DOB: 1985-12-07, 33 y.o.   MRN: [id number]  HPI: Autumn Davis is a 33 y.o. female her appointment was changed, due to national recommendations of social distancing due to COVID 19, an audio/video telehealth visit is felt to be most appropriate for this patient at this time.  See Chart message from today for the patient's consent to telehealth from [facility].     She states her pain is located in her She rates her pain 6. Her current exercise regime is walking and performing stretching exercises.  [name] Morphine equivalent is 56.25 MME. Last Oral Swab was Performed on 06/18/2018, it was consistent.   Autumn Davis CMA asked the Health and History Questions. This provider and Autumn Davis verified we were speaking with the correct person using two identifiers.  Pain Inventory Average Pain 8 Pain Right Now 6 My pain is sharp, burning and tingling  In the last 24 hours, has pain interfered with the following? General activity 6 Relation with others 6 Enjoyment of life 6 What TIME of day is your pain at its worst? night Sleep (in general) Poor  Pain is worse with: walking, bending, sitting, standing and some activites Pain improves with: rest, heat/ice, pacing activities and medication Relief from Meds: 5  Mobility walk without assistance walk with assistance use a cane ability to climb steps?  yes do you drive?  yes  Function disabled: date disabled na  Neuro/Psych weakness numbness tingling trouble walking spasms  Prior Studies Any changes since last visit?  no  Physicians involved in your care Endocrinologist   Family History  Problem Relation Age of Onset  . Thyroid disease Mother   . Colon polyps Father   . Lung cancer Maternal Grandmother   . Cancer Paternal Grandmother        colon/pancreatiec/lymphoma  . Irritable bowel syndrome Brother   . AAA (abdominal aortic  aneurysm) Neg Hx    Social History   Socioeconomic History  . Marital status: Married    Spouse name: Not on file  . Number of children: Not on file  . Years of education: Not on file  . Highest education level: Not on file  Occupational History  . Occupation: disabled    Employer: [employer]  Social Needs  . Financial resource strain: Not on file  . Food insecurity:    Worry: Not on file    Inability: Not on file  . Transportation needs:    Medical: Not on file    Non-medical: Not on file  Tobacco Use  . Smoking status: Never Smoker  . Smokeless tobacco: Never Used  Substance and Sexual Activity  . Alcohol use: No    Alcohol/week: 0.0 standard drinks  . Drug use: No  . Sexual activity: Not on file  Lifestyle  . Physical activity:    Days per week: Not on file    Minutes per session: Not on file  . Stress: Not on file  Relationships  . Social connections:    Talks on phone: Not on file    Gets together: Not on file    Attends religious service: Not on file    Active member of club or organization: Not on file    Attends meetings of clubs or organizations: Not on file    Relationship status: Not on file  Other Topics Concern  . Not on file  Social History  Narrative  . Not on file   Past Surgical History:  Procedure Laterality Date  . 24 HOUR PH STUDY N/A 10/29/2015   Procedure: 24 HOUR PH STUDY;  Surgeon: Iva Boop, MD;  Location: [facility];  Service: Endoscopy;  Laterality: N/A;  . COLONOSCOPY    . ESOPHAGEAL MANOMETRY N/A 10/29/2015   Procedure: ESOPHAGEAL MANOMETRY (EM);  Surgeon: Iva Boop, MD;  Location: [facility];  Service: Endoscopy;  Laterality: N/A;  . KNEE SURGERY  2002/2003   bil  . RECTAL SURGERY  correction of prolapse   2010   Past Medical History:  Diagnosis Date  . Allergy   . Arthritis    HANDS,HIPS,KNEES  . Bronchitis, chronic/intermittent 01/22/2012  . Depression 01/22/2012  . GERD (gastroesophageal reflux  disease)   . Hyperlipidemia   . IBS (irritable bowel syndrome) 01/22/2012  . Lupus (HCC)   . Neuromuscular disorder (HCC)   . Osteoporosis   . Seizures (HCC)   . SOB (shortness of breath)   . Thyroid disease    Pulse 98 Comment: pt reported, fitbit  Ht 5' 7.5" (1.715 m) Comment: pt reported virtual visit  Wt 198 lb (89.8 kg) Comment: pt reported virtual visit  BMI 30.55 kg/m   Opioid Risk Score:   Fall Risk Score:  `1  Depression screen PHQ 2/9  Depression screen [facility] 2/9 03/17/2019 11/05/2018 08/13/2018 05/13/2018 04/22/2018 02/12/2018 02/09/2018  Decreased Interest 0 1 0 1 0 0 1  Down, Depressed, Hopeless 0 0 0 0 0 0 0  PHQ - 2 Score 0 1 0 1 0 0 1  Altered sleeping - - - - - 0 -  Tired, decreased energy - - - - - 0 -  Change in appetite - - - - - 0 -  Feeling bad or failure about yourself  - - - - - 0 -  Trouble concentrating - - - - - 0 -  Moving slowly or fidgety/restless - - - - - 0 -  Suicidal thoughts - - - - - 0 -  PHQ-9 Score - - - - - 0 -  Some recent data might be hidden    Review of Systems  Constitutional: Negative.   HENT: Negative.   Eyes: Negative.   Respiratory: Negative.   Cardiovascular: Negative.   Gastrointestinal: Negative.   Endocrine: Negative.   Genitourinary: Negative.   Musculoskeletal: Positive for back pain.       Spasms Extremity pain   Skin: Negative.   Allergic/Immunologic: Negative.   Neurological: Positive for weakness and numbness.  Psychiatric/Behavioral: Negative.   All other systems reviewed and are negative.      Objective:   Physical Exam Vitals signs and nursing note reviewed.  Musculoskeletal:     Comments: No Physical Exam: Virtual Visit  Neurological:     Mental Status: She is oriented to person, place, and time.           Assessment & Plan:  1. Chronic seizure disorder: No seizure's. Continuecurrent medication regimen withGabapentin. Neurology Following.03/17/2019. 2. Chronic muscle spasms, weakness with  associated pain disorder: Continuecurrent medication regimen withBaclofen. Continue with Exercise regime.03/17/2019. 3. Chronic dysphagia: GI Following.03/17/2019. 4. Anxiety with depression : Stable. Continuecurrent medication regimen withElavil.03/17/2019 5. Fibromyalgia/ Rib Pain/Chronic Pain:Continue Diclofenac: Continue with exercise and heat Therapy.03/17/2019. Continue: oxyCODONE 7.5/325mg  onetablet 5 times daily as needed #150. Continue with slow weaning.  We will continue opioid monitoring program, this consists of regular clinic visits, examinations, urine drug screen, pill counts as  well as use of [place] Controlled Substance Reporting System. 6. Peripheral Neuropathy:Continuecurrent medication regimen withGabapentin: 03/17/2019   Telephone Call  Location of patient: In her Home  Location of provider: Office Established patient Time spent on call: 10 Minutes

## 2019-03-21 ENCOUNTER — Other Ambulatory Visit: Payer: Self-pay | Admitting: Nurse Practitioner

## 2019-03-21 DIAGNOSIS — M3219 Other organ or system involvement in systemic lupus erythematosus: Secondary | ICD-10-CM

## 2019-03-22 NOTE — Telephone Encounter (Signed)
Last office visit:   12/10/2018

## 2019-04-01 ENCOUNTER — Other Ambulatory Visit: Payer: Self-pay | Admitting: Nurse Practitioner

## 2019-04-01 DIAGNOSIS — R350 Frequency of micturition: Secondary | ICD-10-CM

## 2019-04-01 NOTE — Telephone Encounter (Signed)
MMM. NTBS. 3 mos fu was for March. Refill sent to pharmacy

## 2019-04-15 ENCOUNTER — Encounter: Payer: Self-pay | Admitting: Registered Nurse

## 2019-04-15 ENCOUNTER — Encounter: Payer: Managed Care, Other (non HMO) | Attending: Registered Nurse | Admitting: Registered Nurse

## 2019-04-15 ENCOUNTER — Other Ambulatory Visit: Payer: Self-pay

## 2019-04-15 VITALS — BP 116/75 | HR 83 | Resp 14 | Ht 67.5 in | Wt 197.0 lb

## 2019-04-15 DIAGNOSIS — M62838 Other muscle spasm: Secondary | ICD-10-CM

## 2019-04-15 DIAGNOSIS — M6249 Contracture of muscle, multiple sites: Secondary | ICD-10-CM | POA: Diagnosis present

## 2019-04-15 DIAGNOSIS — G894 Chronic pain syndrome: Secondary | ICD-10-CM | POA: Diagnosis not present

## 2019-04-15 DIAGNOSIS — M797 Fibromyalgia: Secondary | ICD-10-CM

## 2019-04-15 DIAGNOSIS — R569 Unspecified convulsions: Secondary | ICD-10-CM | POA: Diagnosis present

## 2019-04-15 DIAGNOSIS — M545 Low back pain: Secondary | ICD-10-CM | POA: Diagnosis present

## 2019-04-15 DIAGNOSIS — G609 Hereditary and idiopathic neuropathy, unspecified: Secondary | ICD-10-CM

## 2019-04-15 DIAGNOSIS — M546 Pain in thoracic spine: Secondary | ICD-10-CM | POA: Diagnosis not present

## 2019-04-15 DIAGNOSIS — M329 Systemic lupus erythematosus, unspecified: Secondary | ICD-10-CM | POA: Diagnosis present

## 2019-04-15 DIAGNOSIS — G8929 Other chronic pain: Secondary | ICD-10-CM

## 2019-04-15 DIAGNOSIS — D8989 Other specified disorders involving the immune mechanism, not elsewhere classified: Secondary | ICD-10-CM

## 2019-04-15 DIAGNOSIS — Z79891 Long term (current) use of opiate analgesic: Secondary | ICD-10-CM

## 2019-04-15 DIAGNOSIS — Z5181 Encounter for therapeutic drug level monitoring: Secondary | ICD-10-CM

## 2019-04-15 MED ORDER — OXYCODONE-ACETAMINOPHEN 7.5-325 MG PO TABS: 1.0000 | ORAL_TABLET | Freq: Every day | ORAL | 0 refills | Status: DC | PRN

## 2019-04-15 NOTE — Progress Notes (Signed)
Subjective:    Patient ID: Autumn Davis, female    DOB: 1986-06-11, 33 y.o.   MRN: [id number]  HPI: Autumn Davis is a 33 y.o. female who returns for follow up appointment for chronic pain and medication refill. She states her pain is located in her ribs  ( mid-back ). She rates her pain 5. Her current exercise regime is walking and performing stretching exercises.  Autumn Davis Morphine equivalent is 56.25 MME.  Last Oral Swab was Performed on 06/18/2018, it was consistent.    Pain Inventory Average Pain 8 Pain Right Now 5 My pain is constant, sharp, burning, dull, stabbing, tingling and aching  In the last 24 hours, has pain interfered with the following? General activity 8 Relation with others 6 Enjoyment of life 6 What TIME of day is your pain at its worst? evening,night Sleep (in general) Poor  Pain is worse with: walking, bending, sitting, inactivity, standing and some activites Pain improves with: rest, heat/ice, therapy/exercise, pacing activities, medication, TENS and injections Relief from Meds: 5  Mobility walk without assistance ability to climb steps?  yes do you drive?  no Do you have any goals in this area?  yes  Function disabled: date disabled . I need assistance with the following:  meal prep, household duties and shopping Do you have any goals in this area?  yes  Neuro/Psych bladder control problems bowel control problems weakness numbness tremor tingling trouble walking spasms dizziness  Prior Studies Any changes since last visit?  no  Physicians involved in your care Any changes since last visit?  no   Family History  Problem Relation Age of Onset  . Thyroid disease Mother   . Colon polyps Father   . Lung cancer Maternal Grandmother   . Cancer Paternal Grandmother        colon/pancreatiec/lymphoma  . Irritable bowel syndrome Brother   . AAA (abdominal aortic aneurysm) Neg Hx    Social History   Socioeconomic History  . Marital  status: Married    Spouse name: Not on file  . Number of children: Not on file  . Years of education: Not on file  . Highest education level: Not on file  Occupational History  . Occupation: disabled    Employer: [employer]  Social Needs  . Financial resource strain: Not on file  . Food insecurity:    Worry: Not on file    Inability: Not on file  . Transportation needs:    Medical: Not on file    Non-medical: Not on file  Tobacco Use  . Smoking status: Never Smoker  . Smokeless tobacco: Never Used  Substance and Sexual Activity  . Alcohol use: No    Alcohol/week: 0.0 standard drinks  . Drug use: No  . Sexual activity: Not on file  Lifestyle  . Physical activity:    Days per week: Not on file    Minutes per session: Not on file  . Stress: Not on file  Relationships  . Social connections:    Talks on phone: Not on file    Gets together: Not on file    Attends religious service: Not on file    Active member of club or organization: Not on file    Attends meetings of clubs or organizations: Not on file    Relationship status: Not on file  Other Topics Concern  . Not on file  Social History Narrative  . Not on file   Past Surgical History:  Procedure Laterality Date  . 24 HOUR PH STUDY N/A 10/29/2015   Procedure: 24 HOUR PH STUDY;  Surgeon: Iva Boop, MD;  Location: [facility];  Service: Endoscopy;  Laterality: N/A;  . COLONOSCOPY    . ESOPHAGEAL MANOMETRY N/A 10/29/2015   Procedure: ESOPHAGEAL MANOMETRY (EM);  Surgeon: Iva Boop, MD;  Location: [facility];  Service: Endoscopy;  Laterality: N/A;  . KNEE SURGERY  2002/2003   bil  . RECTAL SURGERY  correction of prolapse   2010   Past Medical History:  Diagnosis Date  . Allergy   . Arthritis    HANDS,HIPS,KNEES  . Bronchitis, chronic/intermittent 01/22/2012  . Depression 01/22/2012  . GERD (gastroesophageal reflux disease)   . Hyperlipidemia   . IBS (irritable bowel syndrome) 01/22/2012   . Lupus (HCC)   . Neuromuscular disorder (HCC)   . Osteoporosis   . Seizures (HCC)   . SOB (shortness of breath)   . Thyroid disease    Wt 197 lb (89.4 kg)   BMI 30.40 kg/m   Opioid Risk Score:   Fall Risk Score:  `1  Depression screen PHQ 2/9  Depression screen [facility] 2/9 03/17/2019 11/05/2018 08/13/2018 05/13/2018 04/22/2018 02/12/2018 02/09/2018  Decreased Interest 0 1 0 1 0 0 1  Down, Depressed, Hopeless 0 0 0 0 0 0 0  PHQ - 2 Score 0 1 0 1 0 0 1  Altered sleeping - - - - - 0 -  Tired, decreased energy - - - - - 0 -  Change in appetite - - - - - 0 -  Feeling bad or failure about yourself  - - - - - 0 -  Trouble concentrating - - - - - 0 -  Moving slowly or fidgety/restless - - - - - 0 -  Suicidal thoughts - - - - - 0 -  PHQ-9 Score - - - - - 0 -  Some recent data might be hidden    Review of Systems  Constitutional: Negative.   HENT: Negative.   Eyes: Negative.   Respiratory: Negative.   Cardiovascular: Negative.   Gastrointestinal: Negative.   Endocrine: Negative.   Genitourinary: Negative.   Musculoskeletal: Positive for arthralgias, back pain and gait problem.  Skin: Negative.   Allergic/Immunologic: Negative.   Neurological: Positive for dizziness, tremors, weakness and numbness.       Tingling  Psychiatric/Behavioral: Negative.   All other systems reviewed and are negative.      Objective:   Physical Exam Vitals signs and nursing note reviewed.  Constitutional:      Appearance: Normal appearance.  Neck:     Musculoskeletal: Normal range of motion and neck supple.  Cardiovascular:     Rate and Rhythm: Normal rate and regular rhythm.     Pulses: Normal pulses.     Heart sounds: Normal heart sounds.  Pulmonary:     Effort: Pulmonary effort is normal.     Breath sounds: Normal breath sounds.  Musculoskeletal:     Comments: Normal Muscle Bulk and Muscle Testing Reveals:  Upper Extremities: Full ROM and Muscle Strength 5/5 Thoracic Paraspinal Tenderness:  T-7-T-9 Lower Extremities: Full ROM and Muscle Strength 5/5 Arises from Table with ease Narrow Based  Gait   Skin:    General: Skin is warm and dry.  Neurological:     Mental Status: She is alert and oriented to person, place, and time.  Psychiatric:        Mood and Affect: Mood normal.  Behavior: Behavior normal.           Assessment & Plan:  1. Chronic seizure disorder: No seizure's. Continuecurrent medication regimen withGabapentin. Neurology Following.04/15/2019. 2. Chronic muscle spasms, weakness with associated pain disorder: Continuecurrent medication regimen withBaclofen. Continue with Exercise regime.04/15/2019. 3. Chronic dysphagia: GI Following.04/15/2019. 4. Anxiety with depression : Stable. Continuecurrent medication regimen withElavil.04/15/2019 5. Fibromyalgia/ Rib Pain/Chronic Pain:Continue Diclofenac: Continue with exercise and heat Therapy.04/15/2019. Continue: oxyCODONE 7.5/325mg  onetablet 5 times daily as needed #150. Continue with slow weaning.  We will continue opioid monitoring program, this consists of regular clinic visits, examinations, urine drug screen, pill counts as well as use of [place] Controlled Substance Reporting System. 6. Peripheral Neuropathy:Continuecurrent medication regimen withGabapentin: 04/15/2019  F/U in 1 month

## 2019-04-22 ENCOUNTER — Telehealth: Payer: Self-pay | Admitting: Nurse Practitioner

## 2019-04-22 NOTE — Telephone Encounter (Signed)
FYI: PT states that she knows she is due for her depo 29th-12th and she states that her grandmother who is a nurse will be giving her the depo shot this weekend at some point and the pt will call back and let us know which date so we can note it in her chart.

## 2019-04-23 NOTE — Telephone Encounter (Signed)
I am not sure that is legal. Office poilcy is have to get in office

## 2019-04-25 ENCOUNTER — Ambulatory Visit (INDEPENDENT_AMBULATORY_CARE_PROVIDER_SITE_OTHER): Payer: Managed Care, Other (non HMO) | Admitting: *Deleted

## 2019-04-25 ENCOUNTER — Other Ambulatory Visit: Payer: Self-pay

## 2019-04-25 DIAGNOSIS — Z3042 Encounter for surveillance of injectable contraceptive: Secondary | ICD-10-CM

## 2019-04-25 DIAGNOSIS — Z309 Encounter for contraceptive management, unspecified: Secondary | ICD-10-CM

## 2019-04-25 MED ORDER — MEDROXYPROGESTERONE ACETATE 150 MG/ML IM SUSP
150.0000 mg | Freq: Once | INTRAMUSCULAR | Status: AC
  Administered 2019-04-25: 150 mg via INTRAMUSCULAR

## 2019-04-25 NOTE — Telephone Encounter (Signed)
Notified by MMM that pt will have to get in our office

## 2019-05-12 ENCOUNTER — Encounter: Payer: Self-pay | Admitting: Nurse Practitioner

## 2019-05-12 ENCOUNTER — Ambulatory Visit (INDEPENDENT_AMBULATORY_CARE_PROVIDER_SITE_OTHER): Payer: Managed Care, Other (non HMO) | Admitting: Nurse Practitioner

## 2019-05-12 DIAGNOSIS — K219 Gastro-esophageal reflux disease without esophagitis: Secondary | ICD-10-CM | POA: Diagnosis not present

## 2019-05-12 DIAGNOSIS — I479 Paroxysmal tachycardia, unspecified: Secondary | ICD-10-CM | POA: Diagnosis not present

## 2019-05-12 DIAGNOSIS — M329 Systemic lupus erythematosus, unspecified: Secondary | ICD-10-CM

## 2019-05-12 DIAGNOSIS — R350 Frequency of micturition: Secondary | ICD-10-CM

## 2019-05-12 DIAGNOSIS — R600 Localized edema: Secondary | ICD-10-CM

## 2019-05-12 DIAGNOSIS — E559 Vitamin D deficiency, unspecified: Secondary | ICD-10-CM

## 2019-05-12 DIAGNOSIS — F3342 Major depressive disorder, recurrent, in full remission: Secondary | ICD-10-CM

## 2019-05-12 DIAGNOSIS — R1310 Dysphagia, unspecified: Secondary | ICD-10-CM

## 2019-05-12 DIAGNOSIS — M797 Fibromyalgia: Secondary | ICD-10-CM

## 2019-05-12 DIAGNOSIS — K581 Irritable bowel syndrome with constipation: Secondary | ICD-10-CM

## 2019-05-12 DIAGNOSIS — L03019 Cellulitis of unspecified finger: Secondary | ICD-10-CM

## 2019-05-12 DIAGNOSIS — D8989 Other specified disorders involving the immune mechanism, not elsewhere classified: Secondary | ICD-10-CM

## 2019-05-12 DIAGNOSIS — E782 Mixed hyperlipidemia: Secondary | ICD-10-CM

## 2019-05-12 DIAGNOSIS — R609 Edema, unspecified: Secondary | ICD-10-CM

## 2019-05-12 DIAGNOSIS — R569 Unspecified convulsions: Secondary | ICD-10-CM

## 2019-05-12 DIAGNOSIS — K130 Diseases of lips: Secondary | ICD-10-CM

## 2019-05-12 MED ORDER — PANTOPRAZOLE SODIUM 40 MG PO TBEC: 40.0000 mg | DELAYED_RELEASE_TABLET | Freq: Two times a day (BID) | ORAL | 2 refills | Status: DC

## 2019-05-12 MED ORDER — METOPROLOL SUCCINATE ER 25 MG PO TB24: ORAL_TABLET | ORAL | 1 refills | Status: DC

## 2019-05-12 MED ORDER — ATORVASTATIN CALCIUM 40 MG PO TABS: ORAL_TABLET | ORAL | 1 refills | Status: DC

## 2019-05-12 MED ORDER — ACETAZOLAMIDE 250 MG PO TABS: ORAL_TABLET | ORAL | 1 refills | Status: DC

## 2019-05-12 MED ORDER — TAMSULOSIN HCL 0.4 MG PO CAPS: ORAL_CAPSULE | ORAL | 1 refills | Status: DC

## 2019-05-12 MED ORDER — FUROSEMIDE 20 MG PO TABS: 20.0000 mg | ORAL_TABLET | Freq: Every day | ORAL | 1 refills | Status: DC

## 2019-05-12 MED ORDER — BACLOFEN 20 MG PO TABS: ORAL_TABLET | ORAL | 1 refills | Status: DC

## 2019-05-12 MED ORDER — CEPHALEXIN 500 MG PO CAPS: 500.0000 mg | ORAL_CAPSULE | Freq: Three times a day (TID) | ORAL | 0 refills | Status: DC

## 2019-05-12 MED ORDER — NYSTATIN 100000 UNIT/ML MT SUSP: 5.0000 mL | Freq: Four times a day (QID) | OROMUCOSAL | 2 refills | Status: AC

## 2019-05-12 NOTE — Progress Notes (Signed)
Patient ID: Autumn Davis, female   DOB: Dec 06, 1985, 33 y.o.   MRN: [id number]    Virtual Visit via telephone Note  I connected with Autumn Davis on 05/12/19 at 2:45 by telephone and verified that I am speaking with the correct person using two identifiers. Autumn Davis is currently located at home and her mom is currently with her during visit. The provider, Mary-Margaret Daphine Deutscher, FNP is located in their office at time of visit.  I discussed the limitations, risks, security and privacy concerns of performing an evaluation and management service by telephone and the availability of in person appointments. I also discussed with the patient that there may be a patient responsible charge related to this service. The patient expressed understanding and agreed to proceed.   History and Present Illness:   Chief Complaint: Medical Management of Chronic Issues    HPI:  1. Tachycardia, paroxysmal (HCC) Has not had any recent palpitations.   2. Gastroesophageal reflux disease without esophagitis Is on dexilant daily- works well to keep symptoms under control.  3. Irritable bowel syndrome with constipation Takes baclofen daily for cramps- is doing well.  4. Dysphagia, unspecified type Has had no problems swallowing  5. Seizures (HCC) Has not had seizure in several months  6. Recurrent major depressive disorder, in full remission (HCC) Is currently on no antidepressant. s ding well. Depression screen [facility] 2/9 05/12/2019 03/17/2019 11/05/2018  Decreased Interest 1 0 1  Down, Depressed, Hopeless 1 0 0  PHQ - 2 Score 2 0 1  Altered sleeping - - -  Tired, decreased energy - - -  Change in appetite - - -  Feeling bad or failure about yourself  - - -  Trouble concentrating - - -  Moving slowly or fidgety/restless - - -  Suicidal thoughts - - -  PHQ-9 Score - - -  Some recent data might be hidden     7. Mixed hyperlipidemia Has been watching diet and exercising.  8. Fibromyalgia Hurts  everyday. Is on pain medicine and see pain management dialy  9. Inflammatory autoimmune disorder (HCC) Still do not know for sure what she has. She currently does not have follow up  10. Systemic lupus erythematosus, unspecified SLE type, unspecified organ involvement status (HCC) Again still ot sure if she has lupus. Sometimes tests are positive and sometimes they are negative  11. Vitamin D deficiency Takes daily vitamin supplment    Outpatient Encounter Medications as of 05/12/2019  Medication Sig  . acetaZOLAMIDE (DIAMOX) 250 MG tablet TAKE 1 TABLET EVERY MORNING, TAKE 1 TABLET AT 2PM, AND TAKE 2 TABLETSAT BEDTIME  . amitriptyline (ELAVIL) 75 MG tablet Take 1 tablet (75 mg total) by mouth at bedtime.  Marland Kitchen atorvastatin (LIPITOR) 40 MG tablet TAKE ONE (1) TABLET EACH DAY  . baclofen (LIORESAL) 20 MG tablet TAKE ONE TABLET FOUR TIMES DAILY  . budesonide-formoterol (SYMBICORT) 80-4.5 MCG/ACT inhaler Inhale 2 puffs into the lungs 2 (two) times daily for 30 days.  . Calcium-Magnesium-Vitamin D (CALCIUM 1200+D3 PO) Take 1 tablet by mouth daily.  . cephALEXin (KEFLEX) 500 MG capsule Take 1 capsule (500 mg total) by mouth 3 (three) times daily.  . cetirizine (ZYRTEC) 10 MG tablet Take 10 mg by mouth daily.  . Cholecalciferol (VITAMIN D3) 50000 units CAPS Take 50,000 Units by mouth once a week.  . cyanocobalamin (,VITAMIN B-12,) 1000 MCG/ML injection 1ml im QW for 4 weeks then QOW for 4 weeks then monthly  . cycloSPORINE (RESTASIS) 0.05 % ophthalmic  emulsion Place 1 drop into both eyes 2 (two) times daily.  Marland Kitchen dexlansoprazole (DEXILANT) 60 MG capsule TAKE ONE (1) CAPSULE EACH DAY  . diclofenac (VOLTAREN) 75 MG EC tablet TAKE ONE TABLET BY MOUTH TWICE DAILY  . docusate sodium (COLACE) 100 MG capsule Take 100 mg by mouth 2 (two) times daily. PRN  . EPINEPHRINE 0.3 mg/0.3 mL IJ SOAJ injection USE AS DIRECTED  . FORTEO 600 MCG/2.4ML SOLN Inject 20 mcg into the skin at bedtime.  . furosemide (LASIX)  20 MG tablet Take 1 tablet (20 mg total) by mouth daily.  Marland Kitchen gabapentin (NEURONTIN) 600 MG tablet TAKE ONE TABLET FIVE TIMES DAILY  . ipratropium-albuterol (DUONEB) 0.5-2.5 (3) MG/3ML SOLN Take 3 mLs by nebulization every 4 (four) hours as needed.  Marland Kitchen L-Methylfolate 15 MG TABS Take 1 tablet (15 mg total) by mouth daily.  Marland Kitchen L-Methylfolate-Algae (L-METHYLFOLATE FORTE) 15-90.314 MG CAPS TAKE ONE (1) CAPSULE EACH DAY  . medroxyPROGESTERone (DEPO-PROVERA) 150 MG/ML injection Inject 150 mg into the muscle as directed. Every 2 months  . metoprolol succinate (TOPROL-XL) 25 MG 24 hr tablet TAKE ONE (1) TABLET EACH DAY  . montelukast (SINGULAIR) 10 MG tablet TAKE ONE TABLET DAILY AT BEDTIME  . mupirocin ointment (BACTROBAN) 2 % Apply 1 application topically 2 (two) times daily.  Marland Kitchen nystatin (MYCOSTATIN) 100000 UNIT/ML suspension   . nystatin-triamcinolone ointment (MYCOLOG) Apply 1 application topically 2 (two) times daily.  . Omega 3 1000 MG CAPS Take 1 capsule by mouth daily.  Marland Kitchen oxyCODONE-acetaminophen (PERCOCET) 7.5-325 MG tablet Take 1 tablet by mouth 5 (five) times daily as needed for moderate pain. No More Than 5 a day. Do Not Fill Before 04/22/2019  . pantoprazole (PROTONIX) 40 MG tablet Take 1 tablet (40 mg total) by mouth 2 (two) times daily.  . predniSONE (DELTASONE) 5 MG tablet TAKE ONE TABLET EVERY MORNING  . predniSONE (STERAPRED UNI-PAK 21 TAB) 10 MG (21) TBPK tablet As directed x 6 days  . promethazine (PHENERGAN) 12.5 MG tablet Take 1 tablet (12.5 mg total) by mouth every 8 (eight) hours as needed for nausea or vomiting.  . promethazine (PHENERGAN) 25 MG tablet TAKE 1/2 TABLET EVERY 8 HOURS AS NEEDED FOR NAUSEA AND VOMITING  . QNASL 80 MCG/ACT AERS USE 2 SPRAYS IN EACH NOSTRIL DAILY  . scopolamine (TRANSDERM-SCOP, 1.5 MG,) 1 MG/3DAYS Place 1 patch (1.5 mg total) onto the skin every 3 (three) days.  . tamsulosin (FLOMAX) 0.4 MG CAPS capsule TAKE ONE (1) CAPSULE EACH DAY  . VENTOLIN HFA 108 (90  Base) MCG/ACT inhaler USE 2 PUFFS EVERY 4 TO 6 HOURS AS NEEDED  . vitamin E (VITAMIN E) 400 UNIT capsule Take 1,000 Units by mouth daily.     Past Surgical History:  Procedure Laterality Date  . 24 HOUR PH STUDY N/A 10/29/2015   Procedure: 24 HOUR PH STUDY;  Surgeon: Iva Boop, MD;  Location: [facility];  Service: Endoscopy;  Laterality: N/A;  . COLONOSCOPY    . ESOPHAGEAL MANOMETRY N/A 10/29/2015   Procedure: ESOPHAGEAL MANOMETRY (EM);  Surgeon: Iva Boop, MD;  Location: [facility];  Service: Endoscopy;  Laterality: N/A;  . KNEE SURGERY  2002/2003   bil  . RECTAL SURGERY  correction of prolapse   2010    Family History  Problem Relation Age of Onset  . Thyroid disease Mother   . Colon polyps Father   . Lung cancer Maternal Grandmother   . Cancer Paternal Grandmother  colon/pancreatiec/lymphoma  . Irritable bowel syndrome Brother   . AAA (abdominal aortic aneurysm) Neg Hx     New complaints: Sores area in the corners of mouth Sores around nail beds- red and swollen  Social history: Lives with mom since she is going through a divorce     Review of Systems  Constitutional: Negative for diaphoresis and weight loss.  Eyes: Negative for blurred vision, double vision and pain.  Respiratory: Negative for shortness of breath.   Cardiovascular: Negative for chest pain, palpitations, orthopnea and leg swelling.  Gastrointestinal: Negative for abdominal pain.  Musculoskeletal: Positive for back pain, joint pain and myalgias.  Skin: Negative for rash.  Neurological: Negative for dizziness, sensory change, loss of consciousness, weakness and headaches.  Endo/Heme/Allergies: Negative for polydipsia. Does not bruise/bleed easily.  Psychiatric/Behavioral: Negative for memory loss. The patient does not have insomnia.   All other systems reviewed and are negative.    Observations/Objective: Alert and oriented- answers all questions appropriately Mild distress  today  Assessment and Plan: Autumn Davis comes in today with chief complaint of Medical Management of Chronic Issues   Diagnosis and orders addressed:  1. Tachycardia, paroxysmal (HCC) Avoid caffeine - metoprolol succinate (TOPROL-XL) 25 MG 24 hr tablet; TAKE ONE (1) TABLET EACH DAY  Dispense: 90 tablet; Refill: 1  2. Gastroesophageal reflux disease without esophagitis Avoid spicy foods Do not eat 2 hours prior to bedtime - pantoprazole (PROTONIX) 40 MG tablet; Take 1 tablet (40 mg total) by mouth 2 (two) times daily.  Dispense: 60 tablet; Refill: 2  3. Irritable bowel syndrome with constipation - baclofen (LIORESAL) 20 MG tablet; TAKE ONE TABLET FOUR TIMES DAILY  Dispense: 360 each; Refill: 1  4. Dysphagia, unspecified type Chew food well  5. Seizures (HCC) - acetaZOLAMIDE (DIAMOX) 250 MG tablet; TAKE 1 TABLET EVERY MORNING, TAKE 1 TABLET AT 2PM, AND TAKE 2 TABLETSAT BEDTIME  Dispense: 360 tablet; Refill: 1  6. Recurrent major depressive disorder, in full remission [facility]) Stress management  7. Mixed hyperlipidemia Low fat diet - atorvastatin (LIPITOR) 40 MG tablet; TAKE ONE (1) TABLET EACH DAY  Dispense: 90 tablet; Refill: 1  8. Fibromyalgia Continue to exercise daily  9. Inflammatory autoimmune disorder (HCC) Keep follow up with specialist  10. Systemic lupus erythematosus, unspecified SLE type, unspecified organ involvement status (HCC)  11. Vitamin D deficiency Continue daily vitamin d supplement  12. Angular cheilitis - nystatin (MYCOSTATIN) 100000 UNIT/ML suspension; Use as directed 5 mLs (500,000 Units total) in the mouth or throat 4 (four) times daily.  Dispense: 60 mL; Refill: 2  13. Paronychia of finger, unspecified laterality Soak in warm epsom salt - cephALEXin (KEFLEX) 500 MG capsule; Take 1 capsule (500 mg total) by mouth 3 (three) times daily.  Dispense: 30 capsule; Refill: 0  14. Peripheral edema Elevate legs when sitting - furosemide (LASIX) 20 MG  tablet; Take 1 tablet (20 mg total) by mouth daily.  Dispense: 90 tablet; Refill: 1  15. Urinary frequency - tamsulosin (FLOMAX) 0.4 MG CAPS capsule; TAKE ONE (1) CAPSULE EACH DAY  Dispense: 90 capsule; Refill: 1   Labs pending Health Maintenance reviewed Diet and exercise encouraged  Follow up plan: 6 months      I discussed the assessment and treatment plan with the patient. The patient was provided an opportunity to ask questions and all were answered. The patient agreed with the plan and demonstrated an understanding of the instructions.   The patient was advised to call back or seek an  in-person evaluation if the symptoms worsen or if the condition fails to improve as anticipated.  The above assessment and management plan was discussed with the patient. The patient verbalized understanding of and has agreed to the management plan. Patient is aware to call the clinic if symptoms persist or worsen. Patient is aware when to return to the clinic for a follow-up visit. Patient educated on when it is appropriate to go to the emergency department.   Time call ended:  3:15  I provided 25 minutes of non-face-to-face time during this encounter.    Mary-Margaret Daphine Deutscher, FNP

## 2019-05-17 ENCOUNTER — Other Ambulatory Visit: Payer: Self-pay | Admitting: Nurse Practitioner

## 2019-05-18 ENCOUNTER — Other Ambulatory Visit: Payer: Self-pay

## 2019-05-18 ENCOUNTER — Encounter: Payer: Managed Care, Other (non HMO) | Attending: Registered Nurse | Admitting: Registered Nurse

## 2019-05-18 ENCOUNTER — Encounter: Payer: Self-pay | Admitting: Registered Nurse

## 2019-05-18 VITALS — BP 111/77 | HR 97 | Temp 97.7°F | Resp 12 | Ht 67.0 in | Wt 197.0 lb

## 2019-05-18 DIAGNOSIS — M546 Pain in thoracic spine: Secondary | ICD-10-CM

## 2019-05-18 DIAGNOSIS — Z5181 Encounter for therapeutic drug level monitoring: Secondary | ICD-10-CM

## 2019-05-18 DIAGNOSIS — M6249 Contracture of muscle, multiple sites: Secondary | ICD-10-CM | POA: Insufficient documentation

## 2019-05-18 DIAGNOSIS — G8929 Other chronic pain: Secondary | ICD-10-CM

## 2019-05-18 DIAGNOSIS — D8989 Other specified disorders involving the immune mechanism, not elsewhere classified: Secondary | ICD-10-CM

## 2019-05-18 DIAGNOSIS — M545 Low back pain: Secondary | ICD-10-CM | POA: Diagnosis present

## 2019-05-18 DIAGNOSIS — G894 Chronic pain syndrome: Secondary | ICD-10-CM

## 2019-05-18 DIAGNOSIS — R569 Unspecified convulsions: Secondary | ICD-10-CM | POA: Diagnosis present

## 2019-05-18 DIAGNOSIS — G609 Hereditary and idiopathic neuropathy, unspecified: Secondary | ICD-10-CM

## 2019-05-18 DIAGNOSIS — Z79891 Long term (current) use of opiate analgesic: Secondary | ICD-10-CM

## 2019-05-18 DIAGNOSIS — M62838 Other muscle spasm: Secondary | ICD-10-CM | POA: Diagnosis not present

## 2019-05-18 DIAGNOSIS — M329 Systemic lupus erythematosus, unspecified: Secondary | ICD-10-CM | POA: Insufficient documentation

## 2019-05-18 DIAGNOSIS — M797 Fibromyalgia: Secondary | ICD-10-CM

## 2019-05-18 MED ORDER — OXYCODONE-ACETAMINOPHEN 7.5-325 MG PO TABS: 1.0000 | ORAL_TABLET | Freq: Every day | ORAL | 0 refills | Status: DC | PRN

## 2019-05-18 NOTE — Progress Notes (Signed)
Subjective:    Patient ID: Autumn Davis, female    DOB: 1986-04-30, 33 y.o.   MRN: [id number]  HPI: Autumn Davis is a 33 y.o. female who returns for follow up appointment for chronic pain and medication refill. She states her  pain is located in her bilateral ribs. She rates her pain 6. Her current exercise regime is walking and performing stretching exercises.  Autumn Davis Morphine equivalent is 56.25  MME.  Her last Oral Swab was Performed on 06/18/2018 it was consistent. Oral Swab was Performed today.   Pain Inventory Average Pain 7 Pain Right Now 6 My pain is sharp, burning, dull, stabbing, tingling and aching  In the last 24 hours, has pain interfered with the following? General activity 8 Relation with others 5 Enjoyment of life 7 What TIME of day is your pain at its worst? evening Sleep (in general) Poor  Pain is worse with: walking, bending, sitting, inactivity, standing and some activites Pain improves with: rest, heat/ice, therapy/exercise, pacing activities, medication and TENS Relief from Meds: 4  Mobility walk without assistance ability to climb steps?  yes do you drive?  no  Function I need assistance with the following:  meal prep, household duties and shopping  Neuro/Psych bladder control problems bowel control problems numbness tremor tingling trouble walking spasms dizziness  Prior Studies no  Physicians involved in your care no   Family History  Problem Relation Age of Onset  . Thyroid disease Mother   . Colon polyps Father   . Lung cancer Maternal Grandmother   . Cancer Paternal Grandmother        colon/pancreatiec/lymphoma  . Irritable bowel syndrome Brother   . AAA (abdominal aortic aneurysm) Neg Hx    Social History   Socioeconomic History  . Marital status: Married    Spouse name: Not on file  . Number of children: Not on file  . Years of education: Not on file  . Highest education level: Not on file  Occupational History  .  Occupation: disabled    Employer: [employer]  Social Needs  . Financial resource strain: Not on file  . Food insecurity    Worry: Not on file    Inability: Not on file  . Transportation needs    Medical: Not on file    Non-medical: Not on file  Tobacco Use  . Smoking status: Never Smoker  . Smokeless tobacco: Never Used  Substance and Sexual Activity  . Alcohol use: No    Alcohol/week: 0.0 standard drinks  . Drug use: No  . Sexual activity: Not on file  Lifestyle  . Physical activity    Days per week: Not on file    Minutes per session: Not on file  . Stress: Not on file  Relationships  . Social Musician on phone: Not on file    Gets together: Not on file    Attends religious service: Not on file    Active member of club or organization: Not on file    Attends meetings of clubs or organizations: Not on file    Relationship status: Not on file  Other Topics Concern  . Not on file  Social History Narrative  . Not on file   Past Surgical History:  Procedure Laterality Date  . 24 HOUR PH STUDY N/A 10/29/2015   Procedure: 24 HOUR PH STUDY;  Surgeon: Iva Boop, MD;  Location: [facility];  Service: Endoscopy;  Laterality: N/A;  .  COLONOSCOPY    . ESOPHAGEAL MANOMETRY N/A 10/29/2015   Procedure: ESOPHAGEAL MANOMETRY (EM);  Surgeon: Iva Boop, MD;  Location: [facility];  Service: Endoscopy;  Laterality: N/A;  . KNEE SURGERY  2002/2003   bil  . RECTAL SURGERY  correction of prolapse   2010   Past Medical History:  Diagnosis Date  . Allergy   . Arthritis    HANDS,HIPS,KNEES  . Bronchitis, chronic/intermittent 01/22/2012  . Depression 01/22/2012  . GERD (gastroesophageal reflux disease)   . Hyperlipidemia   . IBS (irritable bowel syndrome) 01/22/2012  . Lupus (HCC)   . Neuromuscular disorder (HCC)   . Osteoporosis   . Seizures (HCC)   . SOB (shortness of breath)   . Thyroid disease    There were no vitals taken for this visit.   Opioid Risk Score:   Fall Risk Score:  `1  Depression screen PHQ 2/9  Depression screen [facility] 2/9 05/12/2019 03/17/2019 11/05/2018 08/13/2018 05/13/2018 04/22/2018 02/12/2018  Decreased Interest 1 0 1 0 1 0 0  Down, Depressed, Hopeless 1 0 0 0 0 0 0  PHQ - 2 Score 2 0 1 0 1 0 0  Altered sleeping - - - - - - 0  Tired, decreased energy - - - - - - 0  Change in appetite - - - - - - 0  Feeling bad or failure about yourself  - - - - - - 0  Trouble concentrating - - - - - - 0  Moving slowly or fidgety/restless - - - - - - 0  Suicidal thoughts - - - - - - 0  PHQ-9 Score - - - - - - 0  Some recent data might be hidden     Review of Systems  All other systems reviewed and are negative.      Objective:   Physical Exam Vitals signs and nursing note reviewed.  Constitutional:      Appearance: Normal appearance.  HENT:     Head: Normocephalic and atraumatic.  Neck:     Comments: Cervical Paraspinal Tenderness: C-5-C-6 Cardiovascular:     Rate and Rhythm: Normal rate and regular rhythm.     Pulses: Normal pulses.     Heart sounds: Normal heart sounds.  Pulmonary:     Effort: Pulmonary effort is normal.     Breath sounds: Normal breath sounds.  Musculoskeletal:     Comments: Normal Muscle Bulk and Muscle Testing Reveals:  Upper Extremities: Full ROM and Muscle Strength 5/5 Bilateral  AC Joint Tenderness   Thoracic and  Lumbar Hypersensitivity Lower Extremities: Full ROM and Muscle Strength 5/5 Arises from Table with ease Narrow Based Gait   Skin:    General: Skin is warm and dry.  Neurological:     Mental Status: She is alert and oriented to person, place, and time.  Psychiatric:        Mood and Affect: Mood normal.        Behavior: Behavior normal.           Assessment & Plan:  1. Chronic seizure disorder: No seizure's. Continuecurrent medication regimen withGabapentin. Neurology Following.05/18/2019. 2. Chronic muscle spasms, weakness with associated pain disorder:  Continuecurrent medication regimen withBaclofen. Continue with Exercise regime.05/18/2019. 3. Chronic dysphagia: GI Following.05/18/2019. 4. Anxiety with depression : Stable. Continuecurrent medication regimen withElavil.05/18/2019 5. Fibromyalgia/ Rib Pain/Chronic Pain:Continue Diclofenac: Continue with exercise and heat Therapy.05/18/2019. Continue: oxyCODONE 7.5/325mg  onetablet 5 times daily as needed #150. Continue with slow weaning.We will continue  opioid monitoring program, this consists of regular clinic visits, examinations, urine drug screen, pill counts as well as use of [place] Controlled Substance Reporting System. 6. Peripheral Neuropathy:Continuecurrent medication regimen withGabapentin: 05/18/2019  F/U in 1 month

## 2019-05-22 LAB — DRUG TOX MONITOR 1 W/CONF, ORAL FLD
Amphetamines: NEGATIVE ng/mL (ref <10)
Barbiturates: NEGATIVE ng/mL (ref <10)
Benzodiazepines: NEGATIVE ng/mL (ref <0.50)
Buprenorphine: NEGATIVE ng/mL (ref <0.10)
Cocaine: NEGATIVE ng/mL (ref <5.0)
Codeine: NEGATIVE ng/mL (ref <2.5)
Dihydrocodeine: NEGATIVE ng/mL (ref <2.5)
Fentanyl: NEGATIVE ng/mL (ref <0.10)
Heroin Metabolite: NEGATIVE ng/mL (ref <1.0)
Hydrocodone: NEGATIVE ng/mL (ref <2.5)
Hydromorphone: NEGATIVE ng/mL (ref <2.5)
MARIJUANA: NEGATIVE ng/mL (ref <2.5)
MDMA: NEGATIVE ng/mL (ref <10)
Meprobamate: NEGATIVE ng/mL (ref <2.5)
Methadone: NEGATIVE ng/mL (ref <5.0)
Morphine: NEGATIVE ng/mL (ref <2.5)
Nicotine Metabolite: NEGATIVE ng/mL (ref <5.0)
Norhydrocodone: NEGATIVE ng/mL (ref <2.5)
Noroxycodone: 22.7 ng/mL — ABNORMAL HIGH (ref <2.5)
Opiates: POSITIVE ng/mL — AB (ref <2.5)
Oxycodone: 111 ng/mL — ABNORMAL HIGH (ref <2.5)
Oxymorphone: NEGATIVE ng/mL (ref <2.5)
Phencyclidine: NEGATIVE ng/mL (ref <10)
Tapentadol: NEGATIVE ng/mL (ref <5.0)
Tramadol: NEGATIVE ng/mL (ref <5.0)
Zolpidem: NEGATIVE ng/mL (ref <5.0)

## 2019-05-22 LAB — DRUG TOX ALC METAB W/CON, ORAL FLD: Alcohol Metabolite: NEGATIVE ng/mL (ref <25)

## 2019-05-24 ENCOUNTER — Telehealth: Payer: Self-pay | Admitting: *Deleted

## 2019-05-24 NOTE — Telephone Encounter (Signed)
Oral swab drug screen was consistent for prescribed medications.  ?

## 2019-05-25 ENCOUNTER — Telehealth: Payer: Self-pay | Admitting: Nurse Practitioner

## 2019-05-30 ENCOUNTER — Other Ambulatory Visit: Payer: Self-pay | Admitting: Nurse Practitioner

## 2019-05-30 DIAGNOSIS — M3219 Other organ or system involvement in systemic lupus erythematosus: Secondary | ICD-10-CM

## 2019-05-31 ENCOUNTER — Other Ambulatory Visit: Payer: Self-pay | Admitting: Physician Assistant

## 2019-05-31 NOTE — Telephone Encounter (Signed)
OV 05/12/19 rtc 6 mos

## 2019-05-31 NOTE — Telephone Encounter (Signed)
I called and her answering machine was full so I was unable to leave her a message. I wanted to know if she is having problems again.

## 2019-06-01 NOTE — Telephone Encounter (Signed)
Tried to reach her again and got the full answering machine.

## 2019-06-02 NOTE — Telephone Encounter (Signed)
Left her a message to call us back. 

## 2019-06-02 NOTE — Telephone Encounter (Signed)
I spoke with Autumn Davis and she said she just got her anusol suppositories refilled. She said her pharmacist is always on top of things and he was probability trying to get the okay in case she needed more. She has been doing well and thinks the [place] is helping her. Then recently she had some constipation and then several days of multiple bowel movements and some rectal bleeding. She doesn't want to come in now and said she promises to call us back if the suppositories don't help. She hasn't been out since [place] started other than to ride to the grocery store for curb side pick up.

## 2019-06-17 ENCOUNTER — Ambulatory Visit: Payer: Managed Care, Other (non HMO) | Admitting: Pulmonary Disease

## 2019-06-24 ENCOUNTER — Encounter: Payer: Self-pay | Admitting: Pulmonary Disease

## 2019-06-24 ENCOUNTER — Other Ambulatory Visit: Payer: Self-pay

## 2019-06-24 ENCOUNTER — Ambulatory Visit: Payer: Managed Care, Other (non HMO) | Admitting: Registered Nurse

## 2019-06-24 ENCOUNTER — Ambulatory Visit (INDEPENDENT_AMBULATORY_CARE_PROVIDER_SITE_OTHER): Payer: Managed Care, Other (non HMO) | Admitting: Pulmonary Disease

## 2019-06-24 VITALS — BP 118/70 | HR 90 | Temp 98.7°F | Ht 67.5 in | Wt 199.4 lb

## 2019-06-24 DIAGNOSIS — J4540 Moderate persistent asthma, uncomplicated: Secondary | ICD-10-CM | POA: Diagnosis not present

## 2019-06-24 DIAGNOSIS — J301 Allergic rhinitis due to pollen: Secondary | ICD-10-CM

## 2019-06-24 NOTE — Progress Notes (Signed)
[facility]  Chief Complaint  Patient presents with  . Follow-up    feels symbicort has helped with sob, used rescue inhaler 4 times since last OV, and nebs for 1 day since last OV    Constitutional:  BP 118/70 (BP Location: Right Arm, Cuff Size: Normal)   Pulse 90   Temp 98.7 F (37.1 C) (Oral)   Ht 5' 7.5" (1.715 m)   Wt 199 lb 6.4 oz (90.4 kg)   SpO2 99%   BMI 30.77 kg/m   Past Medical History:  Chronic pain, Lymphedema, Allergies, Neuropathy, Tachycardia, Osteoporosis, IBS, GERD, Depression, Positive ANA  Brief Summary:  Autumn Davis is a 33 y.o. female with dyspnea.  Since her last visit she had PFT and home sleep study.  PFT was consistent with asthma.  Home sleep study was negative for significant sleep apnea.  Saw Lazaro Arms, NP in February.  Started on symbicort and using singulair.  Also started on Qnasal.  Breathing much better.  Sleeping better also.  Feels like she can take a deep breath again.  Not having as much cough.  Not having wheeze, sputum, chest pain.  Hasn't needed to use albuterol much.  CXR 12/21/18 (reviewed by me) was normal.  Physical Exam:   Appearance - well kempt   ENMT - no sinus tenderness, no nasal discharge, no oral exudate  Neck - no masses, trachea midline, no thyromegaly, no elevation in JVP  Respiratory - normal appearance of chest wall, normal respiratory effort w/o accessory muscle use, no dullness on percussion, no wheezing or rales  CV - s1s2 regular rate and rhythm, no murmurs, no peripheral edema, radial pulses symmetric  GI - soft, non tender  Lymph - no adenopathy noted in neck and axillary areas  MSK - normal gait  Ext - no cyanosis, clubbing, or joint inflammation noted  Skin - no rashes, lesions, or ulcers  Neuro - normal strength, oriented x 3  Psych - normal mood and affect   Assessment/Plan:   Moderate, persistent asthma. - continue symbicort, singulair - prn  albuterol  Allergic rhinitis. - continue zyrtec, singulair, Qnasal  Chronic prednisone therapy. - unclear why she is on prednisone chronically - no pulmonary indication for chronic prednisone use - she will f/u with other doctors to discuss tapering off prednisone   Patient Instructions  Follow up in 3 months  A total of  27 minutes were spent face to face with the patient and more than half of that time involved counseling or coordination of care.   Chesley Mires, MD  Pulmonary/Critical Care Pager: [phone] 06/24/2019, 12:31 PM  Flow Sheet     Pulmonary tests:  PFT 05/05/14 >> FEV1 1.60 (44%), FEV1% 69, TLC 3.97 (70%), difficulty with test maneuvers HRCT chest 07/21/14 >> normal PFT 01/05/19 >> FEV1 2.76 (78%), FEV1% 80, TLC 3.95 (69%), DLCO 86%, + BD  Sleep tests:  HST 01/05/19 >> AHI 3.4, SpO2 low 83%  Cardiac tests:  CPST 08/18/14 >> circulatory and muscular limitations Echo 09/15/14 >> EF 60 to 65%  Medications:   Allergies as of 06/24/2019      Reactions   Ciprofloxacin Other (See Comments)   Muscle weakness and numbness   Compazine Other (See Comments)   hallucinations   Prochlorperazine Other (See Comments), Rash   Pt states it makes her feel like things are crawling on her      Medication List       Accurate as of  June 24, 2019 12:31 PM. If you have any questions, ask your nurse or doctor.        STOP taking these medications   Forteo 750 MCG/3ML injection Generic drug: teriparatide Stopped by: Coralyn HellingVineet Bette Brienza, MD     TAKE these medications   acetaZOLAMIDE 250 MG tablet Commonly known as: DIAMOX TAKE 1 TABLET EVERY MORNING, TAKE 1 TABLET AT 2PM, AND TAKE 2 TABLETSAT BEDTIME   amitriptyline 75 MG tablet Commonly known as: ELAVIL Take 1 tablet (75 mg total) by mouth at bedtime.   atorvastatin 40 MG tablet Commonly known as: LIPITOR TAKE ONE (1) TABLET EACH DAY   baclofen 20 MG tablet Commonly known as: LIORESAL TAKE ONE TABLET FOUR  TIMES DAILY   budesonide-formoterol 80-4.5 MCG/ACT inhaler Commonly known as: Symbicort Inhale 2 puffs into the lungs 2 (two) times daily for 30 days.   CALCIUM 1200+D3 PO Take 1 tablet by mouth daily.   cephALEXin 500 MG capsule Commonly known as: Keflex Take 1 capsule (500 mg total) by mouth 3 (three) times daily.   cetirizine 10 MG tablet Commonly known as: ZYRTEC Take 10 mg by mouth daily.   cyanocobalamin 1000 MCG/ML injection Commonly known as: (VITAMIN B-12) 1ml im QW for 4 weeks then QOW for 4 weeks then monthly   cycloSPORINE 0.05 % ophthalmic emulsion Commonly known as: RESTASIS Place 1 drop into both eyes 2 (two) times daily.   dexlansoprazole 60 MG capsule Commonly known as: Dexilant TAKE ONE (1) CAPSULE EACH DAY   diclofenac 75 MG EC tablet Commonly known as: VOLTAREN TAKE ONE TABLET BY MOUTH TWICE DAILY   docusate sodium 100 MG capsule Commonly known as: COLACE Take 100 mg by mouth 2 (two) times daily. PRN   EPINEPHrine 0.3 mg/0.3 mL Soaj injection Commonly known as: EPI-PEN USE AS DIRECTED   furosemide 20 MG tablet Commonly known as: LASIX Take 1 tablet (20 mg total) by mouth daily.   gabapentin 600 MG tablet Commonly known as: NEURONTIN TAKE 1 TABLET 5 TIMES DAILY   ipratropium-albuterol 0.5-2.5 (3) MG/3ML Soln Commonly known as: DUONEB Take 3 mLs by nebulization every 4 (four) hours as needed.   L-Methylfolate 15 MG Tabs Take 1 tablet (15 mg total) by mouth daily.   L-Methylfolate Forte 15-90.314 MG Caps TAKE ONE (1) CAPSULE EACH DAY   medroxyPROGESTERone 150 MG/ML injection Commonly known as: DEPO-PROVERA Inject 150 mg into the muscle as directed. Every 2 months   metoprolol succinate 25 MG 24 hr tablet Commonly known as: TOPROL-XL TAKE ONE (1) TABLET EACH DAY   montelukast 10 MG tablet Commonly known as: SINGULAIR TAKE ONE TABLET DAILY AT BEDTIME   mupirocin ointment 2 % Commonly known as: Bactroban Apply 1 application  topically 2 (two) times daily.   nystatin 100000 UNIT/ML suspension Commonly known as: MYCOSTATIN Use as directed 5 mLs (500,000 Units total) in the mouth or throat 4 (four) times daily.   nystatin-triamcinolone ointment Commonly known as: MYCOLOG Apply 1 application topically 2 (two) times daily.   Omega 3 1000 MG Caps Take 1 capsule by mouth daily.   oxyCODONE-acetaminophen 7.5-325 MG tablet Commonly known as: Percocet Take 1 tablet by mouth 5 (five) times daily as needed for moderate pain. No More Than 5 a day. Do Not Fill Before 05/27/2019   predniSONE 5 MG tablet Commonly known as: DELTASONE TAKE ONE TABLET EVERY MORNING What changed: Another medication with the same name was removed. Continue taking this medication, and follow the directions you see here. Changed  by: Coralyn HellingVineet Marsalis Beaulieu, MD   promethazine 12.5 MG tablet Commonly known as: PHENERGAN Take 1 tablet (12.5 mg total) by mouth every 8 (eight) hours as needed for nausea or vomiting.   promethazine 25 MG tablet Commonly known as: PHENERGAN TAKE 1/2 TABLET EVERY 8 HOURS AS NEEDED FOR NAUSEA AND VOMITING   Qnasl 80 MCG/ACT Aers Generic drug: Beclomethasone Dipropionate USE 2 SPRAYS IN EACH NOSTRIL DAILY   tamsulosin 0.4 MG Caps capsule Commonly known as: FLOMAX TAKE ONE (1) CAPSULE EACH DAY   Ventolin HFA 108 (90 Base) MCG/ACT inhaler Generic drug: albuterol USE 2 PUFFS EVERY 4 TO 6 HOURS AS NEEDED   Vitamin D3 1.25 MG (50000 UT) Caps Take 50,000 Units by mouth once a week.   vitamin E 400 UNIT capsule Generic drug: vitamin E Take 1,000 Units by mouth daily.       Past Surgical History:  She  has a past surgical history that includes Knee surgery (2002/2003); Rectal surgery (correction of prolapse); Colonoscopy; Esophageal manometry (N/A, 10/29/2015); and 24 hour ph study (N/A, 10/29/2015).  Family History:  Her family history includes Cancer in her paternal grandmother; Colon polyps in her father;  Irritable bowel syndrome in her brother; Lung cancer in her maternal grandmother; Thyroid disease in her mother.  Social History:  She  reports that she has never smoked. She has never used smokeless tobacco. She reports that she does not drink alcohol or use drugs.

## 2019-06-24 NOTE — Patient Instructions (Signed)
Follow up in 3 months

## 2019-06-27 ENCOUNTER — Encounter: Payer: Managed Care, Other (non HMO) | Attending: Registered Nurse | Admitting: Registered Nurse

## 2019-06-27 ENCOUNTER — Other Ambulatory Visit: Payer: Self-pay

## 2019-06-27 ENCOUNTER — Encounter: Payer: Self-pay | Admitting: Registered Nurse

## 2019-06-27 VITALS — BP 144/85 | HR 92 | Temp 97.5°F | Resp 18 | Ht 67.5 in | Wt 200.2 lb

## 2019-06-27 DIAGNOSIS — M6249 Contracture of muscle, multiple sites: Secondary | ICD-10-CM | POA: Insufficient documentation

## 2019-06-27 DIAGNOSIS — Z79891 Long term (current) use of opiate analgesic: Secondary | ICD-10-CM

## 2019-06-27 DIAGNOSIS — G609 Hereditary and idiopathic neuropathy, unspecified: Secondary | ICD-10-CM

## 2019-06-27 DIAGNOSIS — G894 Chronic pain syndrome: Secondary | ICD-10-CM | POA: Diagnosis not present

## 2019-06-27 DIAGNOSIS — M797 Fibromyalgia: Secondary | ICD-10-CM | POA: Diagnosis present

## 2019-06-27 DIAGNOSIS — M329 Systemic lupus erythematosus, unspecified: Secondary | ICD-10-CM | POA: Insufficient documentation

## 2019-06-27 DIAGNOSIS — D8989 Other specified disorders involving the immune mechanism, not elsewhere classified: Secondary | ICD-10-CM

## 2019-06-27 DIAGNOSIS — M546 Pain in thoracic spine: Secondary | ICD-10-CM

## 2019-06-27 DIAGNOSIS — M545 Low back pain: Secondary | ICD-10-CM | POA: Diagnosis present

## 2019-06-27 DIAGNOSIS — R569 Unspecified convulsions: Secondary | ICD-10-CM | POA: Diagnosis present

## 2019-06-27 DIAGNOSIS — M62838 Other muscle spasm: Secondary | ICD-10-CM | POA: Diagnosis not present

## 2019-06-27 DIAGNOSIS — Z5181 Encounter for therapeutic drug level monitoring: Secondary | ICD-10-CM

## 2019-06-27 DIAGNOSIS — G8929 Other chronic pain: Secondary | ICD-10-CM

## 2019-06-27 MED ORDER — OXYCODONE-ACETAMINOPHEN 7.5-325 MG PO TABS: 1.0000 | ORAL_TABLET | Freq: Every day | ORAL | 0 refills | Status: DC | PRN

## 2019-06-27 NOTE — Progress Notes (Signed)
Subjective:    Patient ID: Autumn Davis, female    DOB: [birth year], 33 y.o.   MRN: [id number]  HPI: Autumn Davis is a 33 y.o. female who returns for follow up appointment for chronic pain and medication refill. She states her pain is located in her ribs.  ( Bilateral rib pain).She rate her pain 7. Her  current exercise regime is walking and performing stretching exercises.  [name] Morphine equivalent is 56.25 MME.  Last Oral Swab was Performed on 05/18/2019, it was consistent.   Pain Inventory Average Pain 6 Pain Right Now 7 My pain is constant, sharp, burning, dull, stabbing, tingling and aching  In the last 24 hours, has pain interfered with the following? General activity 8 Relation with others 7 Enjoyment of life 6 What TIME of day is your pain at its worst? evening Sleep (in general) Poor  Pain is worse with: walking, bending, sitting, inactivity, standing and some activites Pain improves with: rest, heat/ice, therapy/exercise, pacing activities, medication and TENS Relief from Meds: 4  Mobility walk without assistance ability to climb steps?  yes do you drive?  no  Function disabled: date disabled . I need assistance with the following:  meal prep, household duties and shopping  Neuro/Psych bladder control problems bowel control problems weakness numbness tremor tingling trouble walking spasms dizziness  Prior Studies Any changes since last visit?  no  Physicians involved in your care Any changes since last visit?  no   Family History  Problem Relation Age of Onset  . Thyroid disease Mother   . Colon polyps Father   . Lung cancer Maternal Grandmother   . Cancer Paternal Grandmother        colon/pancreatiec/lymphoma  . Irritable bowel syndrome Brother   . AAA (abdominal aortic aneurysm) Neg Hx    Social History   Socioeconomic History  . Marital status: Married    Spouse name: Not on file  . Number of children: Not on file  . Years of  education: Not on file  . Highest education level: Not on file  Occupational History  . Occupation: disabled    Employer: [employer]  Social Needs  . Financial resource strain: Not on file  . Food insecurity    Worry: Not on file    Inability: Not on file  . Transportation needs    Medical: Not on file    Non-medical: Not on file  Tobacco Use  . Smoking status: Never Smoker  . Smokeless tobacco: Never Used  Substance and Sexual Activity  . Alcohol use: No    Alcohol/week: 0.0 standard drinks  . Drug use: No  . Sexual activity: Not on file  Lifestyle  . Physical activity    Days per week: Not on file    Minutes per session: Not on file  . Stress: Not on file  Relationships  . Social Musician on phone: Not on file    Gets together: Not on file    Attends religious service: Not on file    Active member of club or organization: Not on file    Attends meetings of clubs or organizations: Not on file    Relationship status: Not on file  Other Topics Concern  . Not on file  Social History Narrative  . Not on file   Past Surgical History:  Procedure Laterality Date  . 24 HOUR PH STUDY N/A 10/29/2015   Procedure: 24 HOUR PH STUDY;  Surgeon: Baldo Ash  Sena Slate, MD;  Location: [facility];  Service: Endoscopy;  Laterality: N/A;  . COLONOSCOPY    . ESOPHAGEAL MANOMETRY N/A 10/29/2015   Procedure: ESOPHAGEAL MANOMETRY (EM);  Surgeon: Iva Boop, MD;  Location: [facility];  Service: Endoscopy;  Laterality: N/A;  . KNEE SURGERY  2002/2003   bil  . RECTAL SURGERY  correction of prolapse   2010   Past Medical History:  Diagnosis Date  . Allergy   . Arthritis    HANDS,HIPS,KNEES  . Bronchitis, chronic/intermittent 01/22/2012  . Depression 01/22/2012  . GERD (gastroesophageal reflux disease)   . Hyperlipidemia   . IBS (irritable bowel syndrome) 01/22/2012  . Lupus (HCC)   . Neuromuscular disorder (HCC)   . Osteoporosis   . Seizures (HCC)   . SOB  (shortness of breath)   . Thyroid disease    There were no vitals taken for this visit.  Opioid Risk Score:   Fall Risk Score:  `1  Depression screen PHQ 2/9  Depression screen [facility] 2/9 05/12/2019 03/17/2019 11/05/2018 08/13/2018 05/13/2018 04/22/2018 02/12/2018  Decreased Interest 1 0 1 0 1 0 0  Down, Depressed, Hopeless 1 0 0 0 0 0 0  PHQ - 2 Score 2 0 1 0 1 0 0  Altered sleeping - - - - - - 0  Tired, decreased energy - - - - - - 0  Change in appetite - - - - - - 0  Feeling bad or failure about yourself  - - - - - - 0  Trouble concentrating - - - - - - 0  Moving slowly or fidgety/restless - - - - - - 0  Suicidal thoughts - - - - - - 0  PHQ-9 Score - - - - - - 0  Some recent data might be hidden     Review of Systems  Constitutional: Negative.   HENT: Negative.   Eyes: Negative.   Respiratory: Negative.   Cardiovascular: Negative.   Gastrointestinal: Negative.   Endocrine: Negative.   Genitourinary: Negative.   Musculoskeletal: Positive for back pain, gait problem and myalgias.  Skin: Negative.   Allergic/Immunologic: Negative.   Neurological: Positive for dizziness, weakness and numbness.  Psychiatric/Behavioral: Negative.   All other systems reviewed and are negative.      Objective:   Physical Exam Vitals signs and nursing note reviewed.  Constitutional:      Appearance: Normal appearance.  Neck:     Musculoskeletal: Normal range of motion and neck supple.  Cardiovascular:     Rate and Rhythm: Normal rate and regular rhythm.     Pulses: Normal pulses.     Heart sounds: Normal heart sounds.  Pulmonary:     Effort: Pulmonary effort is normal.     Breath sounds: Normal breath sounds.  Musculoskeletal:     Comments: Normal Muscle Bulk and Muscle Testing Reveals:  Upper Extremities:Full  ROM and Muscle Strength 5/5 Thoracic Paraspinal Tenderness: T-7-T-9 Lower Extremities: Full ROM and Muscle Strength 5/5 Arises from Table with ease Narrow Based Gait   Skin:     General: Skin is warm and dry.  Neurological:     Mental Status: She is alert and oriented to person, place, and time.  Psychiatric:        Mood and Affect: Mood normal.        Behavior: Behavior normal.           Assessment & Plan:  . Chronic seizure disorder: No seizure's. Continuecurrent medication regimen  withGabapentin. Neurology Following.06/27/2019. 2. Chronic muscle spasms, weakness with associated pain disorder: Continuecurrent medication regimen withBaclofen. Continue with Exercise regime.06/27/2019. 3. Chronic dysphagia: No complaints today. Continue to monitor.  GI Following.06/27/2019. 4. Anxiety with depression : Stable. Continuecurrent medication regimen withElavil.06/27/2019 5. Fibromyalgia/ Rib Pain/Chronic Pain:Continue Diclofenac: Continue with exercise and heat Therapy.06/27/2019. Continue: oxyCODONE 7.5/325mg  onetablet 5 times daily as needed #150. Continue with slow weaning.We will continue opioid monitoring program, this consists of regular clinic visits, examinations, urine drug screen, pill counts as well as use of [place] Controlled Substance Reporting System. 6. Peripheral Neuropathy:Continuecurrent medication regimen withGabapentin: 06/27/2019  of face to face patient care time was spent during this visit. All questions were encouraged and answered.  F/U in 1 month

## 2019-07-11 ENCOUNTER — Other Ambulatory Visit: Payer: Self-pay | Admitting: Nurse Practitioner

## 2019-07-20 ENCOUNTER — Other Ambulatory Visit: Payer: Self-pay | Admitting: Nurse Practitioner

## 2019-07-21 ENCOUNTER — Other Ambulatory Visit: Payer: Self-pay

## 2019-07-22 ENCOUNTER — Encounter: Payer: Self-pay | Admitting: Nurse Practitioner

## 2019-07-22 ENCOUNTER — Ambulatory Visit (INDEPENDENT_AMBULATORY_CARE_PROVIDER_SITE_OTHER): Payer: Managed Care, Other (non HMO) | Admitting: Nurse Practitioner

## 2019-07-22 VITALS — BP 132/78 | HR 81 | Temp 99.3°F | Ht 67.0 in | Wt 199.0 lb

## 2019-07-22 DIAGNOSIS — E079 Disorder of thyroid, unspecified: Secondary | ICD-10-CM | POA: Diagnosis not present

## 2019-07-22 DIAGNOSIS — M545 Low back pain, unspecified: Secondary | ICD-10-CM

## 2019-07-22 DIAGNOSIS — R569 Unspecified convulsions: Secondary | ICD-10-CM

## 2019-07-22 DIAGNOSIS — K581 Irritable bowel syndrome with constipation: Secondary | ICD-10-CM

## 2019-07-22 DIAGNOSIS — I479 Paroxysmal tachycardia, unspecified: Secondary | ICD-10-CM | POA: Diagnosis not present

## 2019-07-22 DIAGNOSIS — M797 Fibromyalgia: Secondary | ICD-10-CM

## 2019-07-22 DIAGNOSIS — E782 Mixed hyperlipidemia: Secondary | ICD-10-CM

## 2019-07-22 DIAGNOSIS — K219 Gastro-esophageal reflux disease without esophagitis: Secondary | ICD-10-CM

## 2019-07-22 DIAGNOSIS — E559 Vitamin D deficiency, unspecified: Secondary | ICD-10-CM

## 2019-07-22 DIAGNOSIS — D8989 Other specified disorders involving the immune mechanism, not elsewhere classified: Secondary | ICD-10-CM

## 2019-07-22 DIAGNOSIS — F3342 Major depressive disorder, recurrent, in full remission: Secondary | ICD-10-CM

## 2019-07-22 NOTE — Progress Notes (Signed)
Subjective:    Patient ID: Autumn Davis, female    DOB: 12-17-1985, 33 y.o.   MRN: [id number]   Chief Complaint: medical managment of chronic issues   HPI:  1. Gastroesophageal reflux disease without esophagitis Is on dexilant daily. Works well most days  But has symptoms some days  2. Tachycardia, paroxysmal (HCC) She denies any palpitations recently. She tries to avoid caffeine.  3. Irritable bowel syndrome with constipation No recent flare up  4. Thyroid disease No problems that she is aware of.  5. Recurrent major depressive disorder, in full remission (HCC) Is doing well right now. Her and her husband are gong through a divorce. But she says she is handling well. Depression screen [facility] 2/9 07/22/2019 05/12/2019 03/17/2019  Decreased Interest 0 1 0  Down, Depressed, Hopeless 0 1 0  PHQ - 2 Score 0 2 0  Altered sleeping - - -  Tired, decreased energy - - -  Change in appetite - - -  Feeling bad or failure about yourself  - - -  Trouble concentrating - - -  Moving slowly or fidgety/restless - - -  Suicidal thoughts - - -  PHQ-9 Score - - -  Some recent data might be hidden     6. Mixed hyperlipidemia Has been watching diet and exercising.  Lab Results  Component Value Date   CHOL 184 11/05/2018   HDL 53 11/05/2018   LDLCALC 102 (H) 11/05/2018   TRIG 144 11/05/2018   CHOLHDL 3.5 11/05/2018     7. Inflammatory autoimmune disorder (HCC) Still not sure what autoimmune disorder she has. She is still seeing specialist they are still not sure what they are exacty treating  8. Seizures (HCC) Has had no recent seizure activity  9. Vitamin D deficiency Takes a daily vitamin d supplement  10. Fibromyalgia Sees pain management and is doing well. She tries not to take pain meds if she does not have to.  11. Back pain, lumbosacral Again is seeing pain management. Rates pain today 5/10 which is typical for her.    Outpatient Encounter Medications as of 07/22/2019   Medication Sig  . acetaZOLAMIDE (DIAMOX) 250 MG tablet TAKE 1 TABLET EVERY MORNING, TAKE 1 TABLET AT 2PM, AND TAKE 2 TABLETSAT BEDTIME  . amitriptyline (ELAVIL) 75 MG tablet Take 1 tablet (75 mg total) by mouth at bedtime.  Marland Kitchen atorvastatin (LIPITOR) 40 MG tablet TAKE ONE (1) TABLET EACH DAY  . baclofen (LIORESAL) 20 MG tablet TAKE ONE TABLET FOUR TIMES DAILY  . budesonide-formoterol (SYMBICORT) 80-4.5 MCG/ACT inhaler Inhale 2 puffs into the lungs 2 (two) times daily for 30 days.  . Calcium-Magnesium-Vitamin D (CALCIUM 1200+D3 PO) Take 1 tablet by mouth daily.  . cephALEXin (KEFLEX) 500 MG capsule Take 1 capsule (500 mg total) by mouth 3 (three) times daily.  . cetirizine (ZYRTEC) 10 MG tablet Take 10 mg by mouth daily.  . Cholecalciferol (VITAMIN D3) 50000 units CAPS Take 50,000 Units by mouth once a week.  . cyanocobalamin (,VITAMIN B-12,) 1000 MCG/ML injection 1ml im QW for 4 weeks then QOW for 4 weeks then monthly  . cycloSPORINE (RESTASIS) 0.05 % ophthalmic emulsion Place 1 drop into both eyes 2 (two) times daily.  Marland Kitchen dexlansoprazole (DEXILANT) 60 MG capsule TAKE ONE (1) CAPSULE EACH DAY  . diclofenac (VOLTAREN) 75 MG EC tablet TAKE ONE TABLET BY MOUTH TWICE DAILY  . docusate sodium (COLACE) 100 MG capsule Take 100 mg by mouth 2 (two) times daily. PRN  .  EPINEPHRINE 0.3 mg/0.3 mL IJ SOAJ injection USE AS DIRECTED  . furosemide (LASIX) 20 MG tablet Take 1 tablet (20 mg total) by mouth daily.  Marland Kitchen gabapentin (NEURONTIN) 600 MG tablet TAKE 1 TABLET 5 TIMES DAILY  . ipratropium-albuterol (DUONEB) 0.5-2.5 (3) MG/3ML SOLN Take 3 mLs by nebulization every 4 (four) hours as needed.  Marland Kitchen L-Methylfolate 15 MG TABS Take 1 tablet (15 mg total) by mouth daily.  Marland Kitchen L-Methylfolate-Algae (L-METHYLFOLATE FORTE) 15-90.314 MG CAPS TAKE ONE (1) CAPSULE EACH DAY  . medroxyPROGESTERone (DEPO-PROVERA) 150 MG/ML injection Inject 150 mg into the muscle as directed. Every 2 months  . metoprolol succinate (TOPROL-XL) 25 MG  24 hr tablet TAKE ONE (1) TABLET EACH DAY  . montelukast (SINGULAIR) 10 MG tablet TAKE ONE TABLET DAILY AT BEDTIME  . mupirocin ointment (BACTROBAN) 2 % Apply 1 application topically 2 (two) times daily.  Marland Kitchen nystatin (MYCOSTATIN) 100000 UNIT/ML suspension Use as directed 5 mLs (500,000 Units total) in the mouth or throat 4 (four) times daily.  Marland Kitchen nystatin-triamcinolone ointment (MYCOLOG) Apply 1 application topically 2 (two) times daily.  . Omega 3 1000 MG CAPS Take 1 capsule by mouth daily.  Marland Kitchen oxyCODONE-acetaminophen (PERCOCET) 7.5-325 MG tablet Take 1 tablet by mouth 5 (five) times daily as needed for moderate pain. No More Than 5 a day. Do Not Fill Before 06/27/2019  . predniSONE (DELTASONE) 5 MG tablet TAKE ONE TABLET EVERY MORNING  . promethazine (PHENERGAN) 12.5 MG tablet Take 1 tablet (12.5 mg total) by mouth every 8 (eight) hours as needed for nausea or vomiting.  . promethazine (PHENERGAN) 25 MG tablet TAKE 1/2 TABLET EVERY 8 HOURS AS NEEDED FOR NAUSEA AND VOMITING  . QNASL 80 MCG/ACT AERS USE 2 SPRAYS IN EACH NOSTRIL DAILY  . tamsulosin (FLOMAX) 0.4 MG CAPS capsule TAKE ONE (1) CAPSULE EACH DAY  . VENTOLIN HFA 108 (90 Base) MCG/ACT inhaler USE 2 PUFFS EVERY 4 TO 6 HOURS AS NEEDED  . vitamin E (VITAMIN E) 400 UNIT capsule Take 1,000 Units by mouth daily.      Past Surgical History:  Procedure Laterality Date  . 24 HOUR PH STUDY N/A 10/29/2015   Procedure: 24 HOUR PH STUDY;  Surgeon: Iva Boop, MD;  Location: [facility];  Service: Endoscopy;  Laterality: N/A;  . COLONOSCOPY    . ESOPHAGEAL MANOMETRY N/A 10/29/2015   Procedure: ESOPHAGEAL MANOMETRY (EM);  Surgeon: Iva Boop, MD;  Location: [facility];  Service: Endoscopy;  Laterality: N/A;  . KNEE SURGERY  2002/2003   bil  . RECTAL SURGERY  correction of prolapse   2010    Family History  Problem Relation Age of Onset  . Thyroid disease Mother   . Colon polyps Father   . Lung cancer Maternal Grandmother   .  Cancer Paternal Grandmother        colon/pancreatiec/lymphoma  . Irritable bowel syndrome Brother   . AAA (abdominal aortic aneurysm) Neg Hx     New complaints: None today  Social history: Lives with her parents now that her and her husband are separated.  Controlled substance contract: n/a    Review of Systems  Constitutional: Negative for activity change and appetite change.  HENT: Negative.   Eyes: Negative for pain.  Respiratory: Negative for shortness of breath.   Cardiovascular: Negative for chest pain, palpitations and leg swelling.  Gastrointestinal: Negative for abdominal pain.  Endocrine: Negative for polydipsia.  Genitourinary: Negative.   Musculoskeletal: Positive for back pain and myalgias.  Skin: Negative  for rash.  Neurological: Negative for dizziness, weakness and headaches.  Hematological: Does not bruise/bleed easily.  Psychiatric/Behavioral: Negative.   All other systems reviewed and are negative.      Objective:   Physical Exam Vitals signs and nursing note reviewed.  Constitutional:      General: She is not in acute distress.    Appearance: Normal appearance. She is well-developed.  HENT:     Head: Normocephalic.     Nose: Nose normal.  Eyes:     Pupils: Pupils are equal, round, and reactive to light.  Neck:     Musculoskeletal: Normal range of motion and neck supple.     Vascular: No carotid bruit or JVD.  Cardiovascular:     Rate and Rhythm: Normal rate and regular rhythm.     Heart sounds: Normal heart sounds.  Pulmonary:     Effort: Pulmonary effort is normal. No respiratory distress.     Breath sounds: Normal breath sounds. No wheezing or rales.  Chest:     Chest wall: No tenderness.  Abdominal:     General: Bowel sounds are normal. There is no distension or abdominal bruit.     Palpations: Abdomen is soft. There is no hepatomegaly, splenomegaly, mass or pulsatile mass.     Tenderness: There is no abdominal tenderness.   Musculoskeletal: Normal range of motion.     Comments: Moves slowly due to chronic fibromyalgia  Lymphadenopathy:     Cervical: No cervical adenopathy.  Skin:    General: Skin is warm and dry.  Neurological:     Mental Status: She is alert and oriented to person, place, and time.     Deep Tendon Reflexes: Reflexes are normal and symmetric.  Psychiatric:        Behavior: Behavior normal.        Thought Content: Thought content normal.        Judgment: Judgment normal.     BP 132/78   Pulse 81   Temp 99.3 F (37.4 C) (Oral)   Ht 5\' 7"  (1.702 m)   Wt 199 lb (90.3 kg)   SpO2 98%   BMI 31.17 kg/m        Assessment & Plan:  Dodie Schwanz comes in today with chief complaint of Medical Management of Chronic Issues   Diagnosis and orders addressed:  1. Gastroesophageal reflux disease without esophagitis 2. Tachycardia, paroxysmal (HCC)  3. Irritable bowel syndrome with constipation  4. Thyroid disease  5. Recurrent major depressive disorder, in full remission (HCC)  6. Mixed hyperlipidemia  7. Inflammatory autoimmune disorder (HCC)  8. Seizures (HCC)  9. Vitamin D deficiency  10. Fibromyalgia  11. Back pain, lumbosacral  Orders Placed This Encounter  Procedures  . CMP14+EGFR  . Lipid panel  . Thyroid Panel With TSH   Continue current meds Fall prevention Labs pending Health Maintenance reviewed Diet and exercise encouraged  Follow up plan: 3 months   Autumn Daphine Deutscher, FNP

## 2019-07-22 NOTE — Patient Instructions (Signed)

## 2019-07-22 NOTE — Addendum Note (Signed)
Addended by: Rolena Infante on: 07/22/2019 03:50 PM   Modules accepted: Orders

## 2019-07-23 LAB — THYROID PANEL WITH TSH
Free Thyroxine Index: 1.8 (ref 1.2–4.9)
T3 Uptake Ratio: 30 % (ref 24–39)
T4, Total: 6.1 ug/dL (ref 4.5–12.0)
TSH: 1.2 u[IU]/mL (ref 0.450–4.500)

## 2019-07-23 LAB — CBC WITH DIFFERENTIAL/PLATELET
Basophils Absolute: 0.1 10*3/uL (ref 0.0–0.2)
Basos: 1 %
EOS (ABSOLUTE): 0 10*3/uL (ref 0.0–0.4)
Eos: 0 %
Hematocrit: 38.7 % (ref 34.0–46.6)
Hemoglobin: 12.8 g/dL (ref 11.1–15.9)
Immature Grans (Abs): 0 10*3/uL (ref 0.0–0.1)
Immature Granulocytes: 0 %
Lymphocytes Absolute: 2.3 10*3/uL (ref 0.7–3.1)
Lymphs: 23 %
MCH: 29.6 pg (ref 26.6–33.0)
MCHC: 33.1 g/dL (ref 31.5–35.7)
MCV: 89 fL (ref 79–97)
Monocytes Absolute: 0.5 10*3/uL (ref 0.1–0.9)
Monocytes: 6 %
Neutrophils Absolute: 6.7 10*3/uL (ref 1.4–7.0)
Neutrophils: 70 %
Platelets: 266 10*3/uL (ref 150–450)
RBC: 4.33 x10E6/uL (ref 3.77–5.28)
RDW: 12.7 % (ref 11.7–15.4)
WBC: 9.6 10*3/uL (ref 3.4–10.8)

## 2019-07-23 LAB — CMP14+EGFR
ALT: 17 IU/L (ref 0–32)
AST: 15 IU/L (ref 0–40)
Albumin/Globulin Ratio: 2.4 — ABNORMAL HIGH (ref 1.2–2.2)
Albumin: 4.5 g/dL (ref 3.8–4.8)
Alkaline Phosphatase: 69 IU/L (ref 39–117)
BUN/Creatinine Ratio: 15 (ref 9–23)
BUN: 13 mg/dL (ref 6–20)
Bilirubin Total: 0.3 mg/dL (ref 0.0–1.2)
CO2: 21 mmol/L (ref 20–29)
Calcium: 9.3 mg/dL (ref 8.7–10.2)
Chloride: 110 mmol/L — ABNORMAL HIGH (ref 96–106)
Creatinine, Ser: 0.85 mg/dL (ref 0.57–1.00)
GFR calc Af Amer: 103 mL/min/{1.73_m2} (ref >59)
GFR calc non Af Amer: 89 mL/min/{1.73_m2} (ref >59)
Globulin, Total: 1.9 g/dL (ref 1.5–4.5)
Glucose: 103 mg/dL — ABNORMAL HIGH (ref 65–99)
Potassium: 3.6 mmol/L (ref 3.5–5.2)
Sodium: 143 mmol/L (ref 134–144)
Total Protein: 6.4 g/dL (ref 6.0–8.5)

## 2019-07-23 LAB — LIPID PANEL
Chol/HDL Ratio: 3 ratio (ref 0.0–4.4)
Cholesterol, Total: 186 mg/dL (ref 100–199)
HDL: 62 mg/dL (ref >39)
LDL Chol Calc (NIH): 100 mg/dL — ABNORMAL HIGH (ref 0–99)
Triglycerides: 138 mg/dL (ref 0–149)
VLDL Cholesterol Cal: 24 mg/dL (ref 5–40)

## 2019-07-27 ENCOUNTER — Encounter: Payer: Managed Care, Other (non HMO) | Admitting: Registered Nurse

## 2019-08-03 ENCOUNTER — Other Ambulatory Visit: Payer: Self-pay

## 2019-08-03 ENCOUNTER — Encounter: Payer: Managed Care, Other (non HMO) | Attending: Registered Nurse | Admitting: Registered Nurse

## 2019-08-03 ENCOUNTER — Encounter: Payer: Self-pay | Admitting: Registered Nurse

## 2019-08-03 VITALS — BP 126/83 | HR 83 | Temp 98.5°F | Ht 67.0 in | Wt 201.0 lb

## 2019-08-03 DIAGNOSIS — D8989 Other specified disorders involving the immune mechanism, not elsewhere classified: Secondary | ICD-10-CM

## 2019-08-03 DIAGNOSIS — M545 Low back pain: Secondary | ICD-10-CM | POA: Diagnosis present

## 2019-08-03 DIAGNOSIS — M546 Pain in thoracic spine: Secondary | ICD-10-CM | POA: Diagnosis not present

## 2019-08-03 DIAGNOSIS — Z79891 Long term (current) use of opiate analgesic: Secondary | ICD-10-CM

## 2019-08-03 DIAGNOSIS — M797 Fibromyalgia: Secondary | ICD-10-CM | POA: Insufficient documentation

## 2019-08-03 DIAGNOSIS — Z5181 Encounter for therapeutic drug level monitoring: Secondary | ICD-10-CM

## 2019-08-03 DIAGNOSIS — G894 Chronic pain syndrome: Secondary | ICD-10-CM

## 2019-08-03 DIAGNOSIS — M6249 Contracture of muscle, multiple sites: Secondary | ICD-10-CM | POA: Diagnosis present

## 2019-08-03 DIAGNOSIS — M329 Systemic lupus erythematosus, unspecified: Secondary | ICD-10-CM | POA: Diagnosis present

## 2019-08-03 DIAGNOSIS — M62838 Other muscle spasm: Secondary | ICD-10-CM

## 2019-08-03 DIAGNOSIS — G609 Hereditary and idiopathic neuropathy, unspecified: Secondary | ICD-10-CM

## 2019-08-03 DIAGNOSIS — G8929 Other chronic pain: Secondary | ICD-10-CM

## 2019-08-03 DIAGNOSIS — R569 Unspecified convulsions: Secondary | ICD-10-CM | POA: Diagnosis present

## 2019-08-03 DIAGNOSIS — M7062 Trochanteric bursitis, left hip: Secondary | ICD-10-CM

## 2019-08-03 MED ORDER — OXYCODONE-ACETAMINOPHEN 7.5-325 MG PO TABS: 1.0000 | ORAL_TABLET | Freq: Every day | ORAL | 0 refills | Status: DC | PRN

## 2019-08-03 NOTE — Progress Notes (Signed)
Subjective:    Patient ID: Autumn Davis, female    DOB: 1986/06/19, 33 y.o.   MRN: [id number]  HPI: Autumn Davis is a 33 y.o. female who returns for follow up appointment for chronic pain and medication refill. She states her pain is located in her ribs and left hip pain. She  rates her pain 7. Her current exercise regime is walking, performing stretching exercises and exercise videos 2- 3 times a week. .  [name] Morphine equivalent is 56. 25 MME.  Last Oral Swab was Performe on 05/18/2019, it was consistent.   Pain Inventory Average Pain 6 Pain Right Now 7 My pain is constant, sharp, burning, dull, stabbing and tingling  In the last 24 hours, has pain interfered with the following? General activity 8 Relation with others 6 Enjoyment of life 6 What TIME of day is your pain at its worst? evening and night Sleep (in general) Poor  Pain is worse with: walking, bending, sitting, inactivity, standing and some activites Pain improves with: rest, heat/ice, therapy/exercise, pacing activities, medication and TENS Relief from Meds: n/a  Mobility walk without assistance ability to climb steps?  yes Do you have any goals in this area?  yes  Function I need assistance with the following:  meal prep, household duties and shopping Do you have any goals in this area?  yes  Neuro/Psych bladder control problems bowel control problems weakness numbness tremor tingling trouble walking spasms dizziness  Prior Studies Any changes since last visit?  no  Physicians involved in your care Any changes since last visit?  no   Family History  Problem Relation Age of Onset  . Thyroid disease Mother   . Colon polyps Father   . Lung cancer Maternal Grandmother   . Cancer Paternal Grandmother        colon/pancreatiec/lymphoma  . Irritable bowel syndrome Brother   . AAA (abdominal aortic aneurysm) Neg Hx    Social History   Socioeconomic History  . Marital status: Married    Spouse  name: Not on file  . Number of children: Not on file  . Years of education: Not on file  . Highest education level: Not on file  Occupational History  . Occupation: disabled    Employer: [employer]  Social Needs  . Financial resource strain: Not on file  . Food insecurity    Worry: Not on file    Inability: Not on file  . Transportation needs    Medical: Not on file    Non-medical: Not on file  Tobacco Use  . Smoking status: Never Smoker  . Smokeless tobacco: Never Used  Substance and Sexual Activity  . Alcohol use: No    Alcohol/week: 0.0 standard drinks  . Drug use: No  . Sexual activity: Not on file  Lifestyle  . Physical activity    Days per week: Not on file    Minutes per session: Not on file  . Stress: Not on file  Relationships  . Social Musician on phone: Not on file    Gets together: Not on file    Attends religious service: Not on file    Active member of club or organization: Not on file    Attends meetings of clubs or organizations: Not on file    Relationship status: Not on file  Other Topics Concern  . Not on file  Social History Narrative  . Not on file   Past Surgical History:  Procedure Laterality Date  . 24 HOUR PH STUDY N/A 10/29/2015   Procedure: 24 HOUR PH STUDY;  Surgeon: Iva Boop, MD;  Location: [facility];  Service: Endoscopy;  Laterality: N/A;  . COLONOSCOPY    . ESOPHAGEAL MANOMETRY N/A 10/29/2015   Procedure: ESOPHAGEAL MANOMETRY (EM);  Surgeon: Iva Boop, MD;  Location: [facility];  Service: Endoscopy;  Laterality: N/A;  . KNEE SURGERY  2002/2003   bil  . RECTAL SURGERY  correction of prolapse   2010   Past Medical History:  Diagnosis Date  . Allergy   . Arthritis    HANDS,HIPS,KNEES  . Bronchitis, chronic/intermittent 01/22/2012  . Depression 01/22/2012  . GERD (gastroesophageal reflux disease)   . Hyperlipidemia   . IBS (irritable bowel syndrome) 01/22/2012  . Lupus (HCC)   .  Neuromuscular disorder (HCC)   . Osteoporosis   . Seizures (HCC)   . SOB (shortness of breath)   . Thyroid disease    BP 126/83   Pulse 83   Temp 98.5 F (36.9 C)   Ht 5\' 7"  (1.702 m)   Wt 201 lb (91.2 kg)   SpO2 97%   BMI 31.48 kg/m   Opioid Risk Score:   Fall Risk Score:  `1  Depression screen PHQ 2/9  Depression screen [facility] 2/9 07/22/2019 05/12/2019 03/17/2019 11/05/2018 08/13/2018 05/13/2018 04/22/2018  Decreased Interest 0 1 0 1 0 1 0  Down, Depressed, Hopeless 0 1 0 0 0 0 0  PHQ - 2 Score 0 2 0 1 0 1 0  Altered sleeping - - - - - - -  Tired, decreased energy - - - - - - -  Change in appetite - - - - - - -  Feeling bad or failure about yourself  - - - - - - -  Trouble concentrating - - - - - - -  Moving slowly or fidgety/restless - - - - - - -  Suicidal thoughts - - - - - - -  PHQ-9 Score - - - - - - -  Some recent data might be hidden    Review of Systems  Constitutional: Negative.   HENT: Negative.   Eyes: Negative.   Respiratory: Negative.   Cardiovascular: Negative.   Gastrointestinal: Negative.   Endocrine: Negative.   Genitourinary: Negative.   Musculoskeletal: Positive for gait problem.  Skin: Negative.   Allergic/Immunologic: Negative.   Neurological: Positive for tremors, weakness and numbness.  Hematological: Negative.   Psychiatric/Behavioral: Negative.   All other systems reviewed and are negative.      Objective:   Physical Exam Vitals signs and nursing note reviewed.  Constitutional:      Appearance: Normal appearance.  Neck:     Musculoskeletal: Normal range of motion and neck supple.  Cardiovascular:     Rate and Rhythm: Normal rate and regular rhythm.     Pulses: Normal pulses.     Heart sounds: Normal heart sounds.  Pulmonary:     Effort: Pulmonary effort is normal.     Breath sounds: Normal breath sounds.  Musculoskeletal:     Comments: Normal Muscle Bulk and Muscle Testing Reveals:  Upper Extremities: Full ROM and Muscle Strength  5/5 Thoracic Paraspinal Tenderness: T-7-T-9 Lumbar Paraspinal Tenderness: L-4-L-5 Left Greater Trochanter tenderness Lower Extremities: Full ROM and Muscle Strength 5/5 Arises from Table with ease Narrow Based Gait   Skin:    General: Skin is warm and dry.  Neurological:     Mental Status:  She is alert and oriented to person, place, and time.  Psychiatric:        Mood and Affect: Mood normal.        Behavior: Behavior normal.           Assessment & Plan:  1. Chronic seizure disorder: No seizure's. Continuecurrent medication regimen withGabapentin. Neurology Following.08/03/2019. 2. Chronic muscle spasms, weakness with associated pain disorder: Continuecurrent medication regimen withBaclofen. Continue with Exercise regime.08/03/2019. 3. Chronic dysphagia: No complaints today. Continue to monitor.  GI Following.08/03/2019. 4. Anxiety with depression : Stable. Continuecurrent medication regimen withElavil.08/03/2019 5. Fibromyalgia/ Rib Pain/Chronic Pain:Continue Diclofenac: Continue with exercise and heat Therapy.08/03/2019. Refilled: oxyCODONE 7.5/325mg  onetablet 5 times daily as needed #150. Continue with slow weaning.We will continue opioid monitoring program, this consists of regular clinic visits, examinations, urine drug screen, pill counts as well as use of [place] Controlled Substance Reporting System. 6. Peripheral Neuropathy:Continuecurrent medication regimen withGabapentin: 08/03/2019  of face to face patient care time was spent during this visit. All questions were encouraged and answered.  F/U in 1 month

## 2019-08-08 ENCOUNTER — Other Ambulatory Visit: Payer: Self-pay | Admitting: Nurse Practitioner

## 2019-08-15 ENCOUNTER — Ambulatory Visit: Payer: Medicare Other | Admitting: Nurse Practitioner

## 2019-08-18 ENCOUNTER — Telehealth: Payer: Self-pay | Admitting: *Deleted

## 2019-08-18 NOTE — Telephone Encounter (Signed)
Prior Auth for [employer]-  An active PA is already on file with expiration date of 12/29/2019. Please wait to resubmit request within 60 days of that expiration date to obtain a PA renewal.  Pharmacy aware.

## 2019-08-22 ENCOUNTER — Other Ambulatory Visit: Payer: Self-pay | Admitting: Nurse Practitioner

## 2019-08-22 MED ORDER — SULFAMETHOXAZOLE-TRIMETHOPRIM 800-160 MG PO TABS: 1.0000 | ORAL_TABLET | Freq: Two times a day (BID) | ORAL | 0 refills | Status: DC

## 2019-08-26 ENCOUNTER — Other Ambulatory Visit: Payer: Self-pay | Admitting: Nurse Practitioner

## 2019-08-26 DIAGNOSIS — M3219 Other organ or system involvement in systemic lupus erythematosus: Secondary | ICD-10-CM

## 2019-09-02 ENCOUNTER — Encounter: Payer: Self-pay | Admitting: Registered Nurse

## 2019-09-02 ENCOUNTER — Other Ambulatory Visit: Payer: Self-pay

## 2019-09-02 ENCOUNTER — Encounter: Payer: Managed Care, Other (non HMO) | Attending: Registered Nurse | Admitting: Registered Nurse

## 2019-09-02 VITALS — BP 122/80 | HR 90 | Temp 97.5°F | Ht 67.5 in | Wt 200.0 lb

## 2019-09-02 DIAGNOSIS — M7062 Trochanteric bursitis, left hip: Secondary | ICD-10-CM | POA: Diagnosis not present

## 2019-09-02 DIAGNOSIS — M7061 Trochanteric bursitis, right hip: Secondary | ICD-10-CM | POA: Diagnosis not present

## 2019-09-02 DIAGNOSIS — Z5181 Encounter for therapeutic drug level monitoring: Secondary | ICD-10-CM | POA: Diagnosis not present

## 2019-09-02 DIAGNOSIS — R569 Unspecified convulsions: Secondary | ICD-10-CM | POA: Insufficient documentation

## 2019-09-02 DIAGNOSIS — G8929 Other chronic pain: Secondary | ICD-10-CM | POA: Diagnosis not present

## 2019-09-02 DIAGNOSIS — M546 Pain in thoracic spine: Secondary | ICD-10-CM | POA: Diagnosis not present

## 2019-09-02 DIAGNOSIS — M62838 Other muscle spasm: Secondary | ICD-10-CM

## 2019-09-02 DIAGNOSIS — D8989 Other specified disorders involving the immune mechanism, not elsewhere classified: Secondary | ICD-10-CM | POA: Diagnosis not present

## 2019-09-02 DIAGNOSIS — G609 Hereditary and idiopathic neuropathy, unspecified: Secondary | ICD-10-CM | POA: Diagnosis not present

## 2019-09-02 DIAGNOSIS — M797 Fibromyalgia: Secondary | ICD-10-CM | POA: Diagnosis not present

## 2019-09-02 DIAGNOSIS — M329 Systemic lupus erythematosus, unspecified: Secondary | ICD-10-CM | POA: Diagnosis present

## 2019-09-02 DIAGNOSIS — M545 Low back pain: Secondary | ICD-10-CM | POA: Insufficient documentation

## 2019-09-02 DIAGNOSIS — G894 Chronic pain syndrome: Secondary | ICD-10-CM

## 2019-09-02 DIAGNOSIS — M6249 Contracture of muscle, multiple sites: Secondary | ICD-10-CM | POA: Insufficient documentation

## 2019-09-02 DIAGNOSIS — Z79891 Long term (current) use of opiate analgesic: Secondary | ICD-10-CM | POA: Diagnosis not present

## 2019-09-02 MED ORDER — OXYCODONE-ACETAMINOPHEN 7.5-325 MG PO TABS: 1.0000 | ORAL_TABLET | Freq: Every day | ORAL | 0 refills | Status: DC | PRN

## 2019-09-02 NOTE — Progress Notes (Signed)
Subjective:    Patient ID: Autumn Davis, female    DOB: [birth year], 33 y.o.   MRN: [id number]  HPI: Autumn Davis is a 33 y.o. female who returns for follow up appointment for chronic pain and medication refill. She states her pain is located in her bilateral wrists, .bilateral shoulders, mid- back ( bilateral rib pain) and bilateral hip pain. Also reports generalized joint pain. She rates her pain current exercise regime is walking and performing stretching exercises.  [name] Morphine equivalent is 56.25  MME.  Last Oral Swab was Performed on 05/18/2019, it was consistent.   Pain Inventory Average Pain 8 Pain Right Now 7 My pain is constant, sharp, burning, tingling and aching  In the last 24 hours, has pain interfered with the following? General activity 9 Relation with others 6 Enjoyment of life 6 What TIME of day is your pain at its worst? evening, night Sleep (in general) Poor  Pain is worse with: walking, bending, sitting, inactivity, standing and some activites Pain improves with: rest, heat/ice, therapy/exercise, pacing activities, medication and TENS Relief from Meds: 5  Mobility walk without assistance walk with assistance ability to climb steps?  yes do you drive?  no Do you have any goals in this area?  yes  Function disabled: date disabled . I need assistance with the following:  meal prep, household duties and shopping Do you have any goals in this area?  yes  Neuro/Psych bladder control problems bowel control problems weakness numbness tremor tingling trouble walking spasms dizziness  Prior Studies Any changes since last visit?  no  Physicians involved in your care Any changes since last visit?  no   Family History  Problem Relation Age of Onset  . Thyroid disease Mother   . Colon polyps Father   . Lung cancer Maternal Grandmother   . Cancer Paternal Grandmother        colon/pancreatiec/lymphoma  . Irritable bowel syndrome Brother   . AAA  (abdominal aortic aneurysm) Neg Hx    Social History   Socioeconomic History  . Marital status: Married    Spouse name: Not on file  . Number of children: Not on file  . Years of education: Not on file  . Highest education level: Not on file  Occupational History  . Occupation: disabled    Employer: [employer]  Social Needs  . Financial resource strain: Not on file  . Food insecurity    Worry: Not on file    Inability: Not on file  . Transportation needs    Medical: Not on file    Non-medical: Not on file  Tobacco Use  . Smoking status: Never Smoker  . Smokeless tobacco: Never Used  Substance and Sexual Activity  . Alcohol use: No    Alcohol/week: 0.0 standard drinks  . Drug use: No  . Sexual activity: Not on file  Lifestyle  . Physical activity    Days per week: Not on file    Minutes per session: Not on file  . Stress: Not on file  Relationships  . Social Musician on phone: Not on file    Gets together: Not on file    Attends religious service: Not on file    Active member of club or organization: Not on file    Attends meetings of clubs or organizations: Not on file    Relationship status: Not on file  Other Topics Concern  . Not on file  Social  History Narrative  . Not on file   Past Surgical History:  Procedure Laterality Date  . 24 HOUR PH STUDY N/A 10/29/2015   Procedure: 24 HOUR PH STUDY;  Surgeon: Iva Boop, MD;  Location: [facility];  Service: Endoscopy;  Laterality: N/A;  . COLONOSCOPY    . ESOPHAGEAL MANOMETRY N/A 10/29/2015   Procedure: ESOPHAGEAL MANOMETRY (EM);  Surgeon: Iva Boop, MD;  Location: [facility];  Service: Endoscopy;  Laterality: N/A;  . KNEE SURGERY  2002/2003   bil  . RECTAL SURGERY  correction of prolapse   2010   Past Medical History:  Diagnosis Date  . Allergy   . Arthritis    HANDS,HIPS,KNEES  . Bronchitis, chronic/intermittent 01/22/2012  . Depression 01/22/2012  . GERD  (gastroesophageal reflux disease)   . Hyperlipidemia   . IBS (irritable bowel syndrome) 01/22/2012  . Lupus (HCC)   . Neuromuscular disorder (HCC)   . Osteoporosis   . Seizures (HCC)   . SOB (shortness of breath)   . Thyroid disease    BP 122/80   Pulse 90   Temp (!) 97.5 F (36.4 C)   Ht 5' 7.5" (1.715 m)   Wt 200 lb (90.7 kg)   SpO2 98%   BMI 30.86 kg/m   Opioid Risk Score:   Fall Risk Score:  `1  Depression screen PHQ 2/9  Depression screen [facility] 2/9 07/22/2019 05/12/2019 03/17/2019 11/05/2018 08/13/2018 05/13/2018 04/22/2018  Decreased Interest 0 1 0 1 0 1 0  Down, Depressed, Hopeless 0 1 0 0 0 0 0  PHQ - 2 Score 0 2 0 1 0 1 0  Altered sleeping - - - - - - -  Tired, decreased energy - - - - - - -  Change in appetite - - - - - - -  Feeling bad or failure about yourself  - - - - - - -  Trouble concentrating - - - - - - -  Moving slowly or fidgety/restless - - - - - - -  Suicidal thoughts - - - - - - -  PHQ-9 Score - - - - - - -  Some recent data might be hidden    Review of Systems  Constitutional: Negative.   HENT: Negative.   Eyes: Negative.   Respiratory: Negative.   Cardiovascular: Negative.   Gastrointestinal: Negative.   Endocrine: Negative.   Genitourinary: Negative.   Musculoskeletal: Positive for arthralgias, back pain, gait problem and myalgias.       Spasms   Skin: Negative.   Allergic/Immunologic: Negative.   Neurological: Positive for tremors, weakness and numbness.       Tingling  Psychiatric/Behavioral: Negative.   All other systems reviewed and are negative.      Objective:   Physical Exam Vitals signs and nursing note reviewed.  Constitutional:      Appearance: Normal appearance.  Neck:     Musculoskeletal: Normal range of motion and neck supple.  Cardiovascular:     Rate and Rhythm: Normal rate and regular rhythm.     Pulses: Normal pulses.     Heart sounds: Normal heart sounds.  Pulmonary:     Effort: Pulmonary effort is normal.      Breath sounds: Normal breath sounds.  Musculoskeletal:     Comments: Normal Muscle Bulk and Muscle Testing Reveals:  Upper Extremities: Full ROM and Muscle Strength 5/5 Thoracic Hypersensitivity: T-1- T-9 Lower Extremities: Full ROM and Muscle Strength 5/5 Arises from Table with ease Narrow  Based Gait   Skin:    General: Skin is warm and dry.  Neurological:     Mental Status: She is alert and oriented to person, place, and time.  Psychiatric:        Mood and Affect: Mood normal.        Behavior: Behavior normal.           Assessment & Plan:  1. Chronic seizure disorder: No seizure's. Continuecurrent medication regimen withGabapentin. Neurology Following.09/02/2019. 2. Chronic muscle spasms, weakness with associated pain disorder: Continuecurrent medication regimen withBaclofen. Continue with Exercise regime.09/02/2019. 3. Chronic dysphagia:No complaints today. Continue to monitor. GI Following.09/02/2019. 4. Anxiety with depression : Stable. Continuecurrent medication regimen withElavil.09/02/2019 5. Fibromyalgia/ Rib Pain/Chronic Pain:Continue Diclofenac: Continue with exercise and heat Therapy.09/02/2019. Refilled: oxyCODONE 7.5/325mg  onetablet 5 times daily as needed #150. Continue with slow weaning.We will continue opioid monitoring program, this consists of regular clinic visits, examinations, urine drug screen, pill counts as well as use of [place] Controlled Substance Reporting System. 6. Peripheral Neuropathy:Continuecurrent medication regimen withGabapentin: 09/02/2019  of face to face patient care time was spent during this visit. All questions were encouraged and answered.  F/U in 1 month

## 2019-09-12 ENCOUNTER — Other Ambulatory Visit: Payer: Self-pay | Admitting: Nurse Practitioner

## 2019-09-12 DIAGNOSIS — M3219 Other organ or system involvement in systemic lupus erythematosus: Secondary | ICD-10-CM

## 2019-09-19 ENCOUNTER — Other Ambulatory Visit: Payer: Self-pay | Admitting: Nurse Practitioner

## 2019-09-26 ENCOUNTER — Other Ambulatory Visit: Payer: Self-pay

## 2019-09-26 ENCOUNTER — Ambulatory Visit (INDEPENDENT_AMBULATORY_CARE_PROVIDER_SITE_OTHER): Payer: Managed Care, Other (non HMO)

## 2019-09-26 DIAGNOSIS — Z23 Encounter for immunization: Secondary | ICD-10-CM | POA: Diagnosis not present

## 2019-09-27 DIAGNOSIS — Z6831 Body mass index (BMI) 31.0-31.9, adult: Secondary | ICD-10-CM | POA: Diagnosis not present

## 2019-09-27 DIAGNOSIS — Z01419 Encounter for gynecological examination (general) (routine) without abnormal findings: Secondary | ICD-10-CM | POA: Diagnosis not present

## 2019-10-04 ENCOUNTER — Ambulatory Visit: Payer: Managed Care, Other (non HMO) | Admitting: Registered Nurse

## 2019-10-10 ENCOUNTER — Other Ambulatory Visit: Payer: Self-pay

## 2019-10-10 ENCOUNTER — Encounter: Payer: Self-pay | Admitting: Registered Nurse

## 2019-10-10 ENCOUNTER — Encounter: Payer: Managed Care, Other (non HMO) | Attending: Registered Nurse | Admitting: Registered Nurse

## 2019-10-10 VITALS — BP 110/76 | HR 93

## 2019-10-10 DIAGNOSIS — M6249 Contracture of muscle, multiple sites: Secondary | ICD-10-CM | POA: Diagnosis present

## 2019-10-10 DIAGNOSIS — M546 Pain in thoracic spine: Secondary | ICD-10-CM | POA: Diagnosis not present

## 2019-10-10 DIAGNOSIS — M62838 Other muscle spasm: Secondary | ICD-10-CM

## 2019-10-10 DIAGNOSIS — M329 Systemic lupus erythematosus, unspecified: Secondary | ICD-10-CM | POA: Insufficient documentation

## 2019-10-10 DIAGNOSIS — M7061 Trochanteric bursitis, right hip: Secondary | ICD-10-CM

## 2019-10-10 DIAGNOSIS — Z5181 Encounter for therapeutic drug level monitoring: Secondary | ICD-10-CM

## 2019-10-10 DIAGNOSIS — M7062 Trochanteric bursitis, left hip: Secondary | ICD-10-CM | POA: Diagnosis not present

## 2019-10-10 DIAGNOSIS — M545 Low back pain: Secondary | ICD-10-CM | POA: Diagnosis present

## 2019-10-10 DIAGNOSIS — G8929 Other chronic pain: Secondary | ICD-10-CM

## 2019-10-10 DIAGNOSIS — R569 Unspecified convulsions: Secondary | ICD-10-CM

## 2019-10-10 DIAGNOSIS — G894 Chronic pain syndrome: Secondary | ICD-10-CM

## 2019-10-10 DIAGNOSIS — Z79891 Long term (current) use of opiate analgesic: Secondary | ICD-10-CM | POA: Diagnosis not present

## 2019-10-10 DIAGNOSIS — M797 Fibromyalgia: Secondary | ICD-10-CM

## 2019-10-10 MED ORDER — OXYCODONE-ACETAMINOPHEN 7.5-325 MG PO TABS: 1.0000 | ORAL_TABLET | Freq: Every day | ORAL | 0 refills | Status: DC | PRN

## 2019-10-10 NOTE — Progress Notes (Signed)
Subjective:    Patient ID: Autumn Davis, female    DOB: 1986/10/10, 33 y.o.   MRN: [id number]  HPI: Autumn Davis is a 33 y.o. female who returns for follow up appointment for chronic pain and medication refill. She states her pain is located in her mid- back and bilateral hip pain. Also reports generalized joint pain. She rates her pain 8. Her current exercise regime is walking and performing stretching exercises.  [name] Morphine equivalent is 56.25 MME.  Oral Swab was Performed Today.   Pain Inventory Average Pain 7 Pain Right Now 8 My pain is constant, sharp, burning, dull, stabbing, tingling and aching  In the last 24 hours, has pain interfered with the following? General activity 9 Relation with others 6 Enjoyment of life 6 What TIME of day is your pain at its worst? evening and night Sleep (in general) Poor  Pain is worse with: walking, bending, sitting, inactivity, standing and unsure Pain improves with: rest, heat/ice, therapy/exercise, pacing activities, medication and TENS Relief from Meds: 4  Mobility ability to climb steps?  yes do you drive?  no  Function I need assistance with the following:  meal prep, household duties and shopping  Neuro/Psych bladder control problems bowel control problems weakness numbness tremor tingling trouble walking spasms dizziness  Prior Studies Any changes since last visit?  no  Physicians involved in your care Any changes since last visit?  no   Family History  Problem Relation Age of Onset  . Thyroid disease Mother   . Colon polyps Father   . Lung cancer Maternal Grandmother   . Cancer Paternal Grandmother        colon/pancreatiec/lymphoma  . Irritable bowel syndrome Brother   . AAA (abdominal aortic aneurysm) Neg Hx    Social History   Socioeconomic History  . Marital status: Married    Spouse name: Not on file  . Number of children: Not on file  . Years of education: Not on file  . Highest education  level: Not on file  Occupational History  . Occupation: disabled    Employer: [employer]  Social Needs  . Financial resource strain: Not on file  . Food insecurity    Worry: Not on file    Inability: Not on file  . Transportation needs    Medical: Not on file    Non-medical: Not on file  Tobacco Use  . Smoking status: Never Smoker  . Smokeless tobacco: Never Used  Substance and Sexual Activity  . Alcohol use: No    Alcohol/week: 0.0 standard drinks  . Drug use: No  . Sexual activity: Not on file  Lifestyle  . Physical activity    Days per week: Not on file    Minutes per session: Not on file  . Stress: Not on file  Relationships  . Social Musician on phone: Not on file    Gets together: Not on file    Attends religious service: Not on file    Active member of club or organization: Not on file    Attends meetings of clubs or organizations: Not on file    Relationship status: Not on file  Other Topics Concern  . Not on file  Social History Narrative  . Not on file   Past Surgical History:  Procedure Laterality Date  . 24 HOUR PH STUDY N/A 10/29/2015   Procedure: 24 HOUR PH STUDY;  Surgeon: Iva Boop, MD;  Location: [facility];  Service: Endoscopy;  Laterality: N/A;  . COLONOSCOPY    . ESOPHAGEAL MANOMETRY N/A 10/29/2015   Procedure: ESOPHAGEAL MANOMETRY (EM);  Surgeon: Iva Boop, MD;  Location: [facility];  Service: Endoscopy;  Laterality: N/A;  . KNEE SURGERY  2002/2003   bil  . RECTAL SURGERY  correction of prolapse   2010   Past Medical History:  Diagnosis Date  . Allergy   . Arthritis    HANDS,HIPS,KNEES  . Bronchitis, chronic/intermittent 01/22/2012  . Depression 01/22/2012  . GERD (gastroesophageal reflux disease)   . Hyperlipidemia   . IBS (irritable bowel syndrome) 01/22/2012  . Lupus (HCC)   . Neuromuscular disorder (HCC)   . Osteoporosis   . Seizures (HCC)   . SOB (shortness of breath)   . Thyroid disease     There were no vitals taken for this visit.  Opioid Risk Score:   Fall Risk Score:  `1  Depression screen PHQ 2/9  Depression screen [facility] 2/9 07/22/2019 05/12/2019 03/17/2019 11/05/2018 08/13/2018 05/13/2018 04/22/2018  Decreased Interest 0 1 0 1 0 1 0  Down, Depressed, Hopeless 0 1 0 0 0 0 0  PHQ - 2 Score 0 2 0 1 0 1 0  Altered sleeping - - - - - - -  Tired, decreased energy - - - - - - -  Change in appetite - - - - - - -  Feeling bad or failure about yourself  - - - - - - -  Trouble concentrating - - - - - - -  Moving slowly or fidgety/restless - - - - - - -  Suicidal thoughts - - - - - - -  PHQ-9 Score - - - - - - -  Some recent data might be hidden    Review of Systems  Constitutional: Negative.   HENT: Negative.   Eyes: Negative.   Respiratory: Negative.   Cardiovascular: Negative.   Gastrointestinal:       Bowel control  Endocrine: Negative.   Genitourinary:       Bladder control  Musculoskeletal: Positive for back pain and gait problem.       Spasms  Skin: Negative.   Allergic/Immunologic: Negative.   Neurological: Positive for dizziness, tremors, weakness and numbness.       Tingling  Hematological: Negative.   Psychiatric/Behavioral: Negative.   All other systems reviewed and are negative.      Objective:   Physical Exam Vitals signs and nursing note reviewed.  Constitutional:      Appearance: Normal appearance.  Neck:     Musculoskeletal: Normal range of motion and neck supple.  Cardiovascular:     Rate and Rhythm: Normal rate and regular rhythm.     Pulses: Normal pulses.     Heart sounds: Normal heart sounds.  Pulmonary:     Effort: Pulmonary effort is normal.     Breath sounds: Normal breath sounds.  Skin:    Comments: Normal Muscle Bulk and Muscle Testing Reveals:  Upper Extremities: Full ROM and Muscle Strength 5/5 Thoracic Paraspinal Tenderness: T-7-T-9 Bilateral Greater Trochanter Tenderness Lower Extremities: Full ROM and Muscle Strength 5/5  Arises from chair with ease Narrow Based  Gait   Neurological:     Mental Status: She is alert and oriented to person, place, and time.  Psychiatric:        Mood and Affect: Mood normal.        Behavior: Behavior normal.  Assessment & Plan:  1.Chronic seizure disorder: No seizure's. Continuecurrent medication regimen withGabapentin. Neurology Following.10/10/2019. 2. Chronic muscle spasms, weakness with associated pain disorder: Continuecurrent medication regimen withBaclofen. Continue with Exercise regime.10/10/2019. 3. Chronic dysphagia:No complaints today. Continue to monitor. GI Following.10/10/2019. 4. Anxiety with depression : Stable. Continuecurrent medication regimen withElavil.10/10/2019 5. Fibromyalgia/ Rib Pain/Chronic Pain:Continue Diclofenac: Continue with exercise and heat Therapy.10/10/2019. Refilled: oxyCODONE 7.5/325mg  onetablet 5 times daily as needed #150. Continue with slow weaning.We will continue opioid monitoring program, this consists of regular clinic visits, examinations, urine drug screen, pill counts as well as use of [place] Controlled Substance Reporting System. 6. Peripheral Neuropathy:Continuecurrent medication regimen withGabapentin: 10/10/2019  of face to face patient care time was spent during this visit. All questions were encouraged and answered.  F/U in 1 month

## 2019-10-15 LAB — DRUG TOX MONITOR 1 W/CONF, ORAL FLD
Amphetamines: NEGATIVE ng/mL (ref <10)
Barbiturates: NEGATIVE ng/mL (ref <10)
Benzodiazepines: NEGATIVE ng/mL (ref <0.50)
Buprenorphine: NEGATIVE ng/mL (ref <0.10)
Cocaine: NEGATIVE ng/mL (ref <5.0)
Codeine: NEGATIVE ng/mL (ref <2.5)
Dihydrocodeine: NEGATIVE ng/mL (ref <2.5)
Fentanyl: NEGATIVE ng/mL (ref <0.10)
Heroin Metabolite: NEGATIVE ng/mL (ref <1.0)
Hydrocodone: NEGATIVE ng/mL (ref <2.5)
Hydromorphone: NEGATIVE ng/mL (ref <2.5)
MARIJUANA: NEGATIVE ng/mL (ref <2.5)
MDMA: NEGATIVE ng/mL (ref <10)
Meprobamate: NEGATIVE ng/mL (ref <2.5)
Methadone: NEGATIVE ng/mL (ref <5.0)
Morphine: NEGATIVE ng/mL (ref <2.5)
Nicotine Metabolite: NEGATIVE ng/mL (ref <5.0)
Norhydrocodone: NEGATIVE ng/mL (ref <2.5)
Noroxycodone: 25.8 ng/mL — ABNORMAL HIGH (ref <2.5)
Opiates: POSITIVE ng/mL — AB (ref <2.5)
Oxycodone: 122.8 ng/mL — ABNORMAL HIGH (ref <2.5)
Oxymorphone: NEGATIVE ng/mL (ref <2.5)
Phencyclidine: NEGATIVE ng/mL (ref <10)
Tapentadol: NEGATIVE ng/mL (ref <5.0)
Tramadol: NEGATIVE ng/mL (ref <5.0)
Zolpidem: NEGATIVE ng/mL (ref <5.0)

## 2019-10-15 LAB — DRUG TOX ALC METAB W/CON, ORAL FLD: Alcohol Metabolite: NEGATIVE ng/mL (ref <25)

## 2019-10-17 ENCOUNTER — Telehealth (INDEPENDENT_AMBULATORY_CARE_PROVIDER_SITE_OTHER): Payer: Managed Care, Other (non HMO) | Admitting: Pulmonary Disease

## 2019-10-17 ENCOUNTER — Encounter: Payer: Self-pay | Admitting: Pulmonary Disease

## 2019-10-17 DIAGNOSIS — J301 Allergic rhinitis due to pollen: Secondary | ICD-10-CM | POA: Diagnosis not present

## 2019-10-17 DIAGNOSIS — J4540 Moderate persistent asthma, uncomplicated: Secondary | ICD-10-CM | POA: Diagnosis not present

## 2019-10-17 NOTE — Patient Instructions (Signed)
Follow up in 1 year.

## 2019-10-17 NOTE — Progress Notes (Signed)
[facility]  Chief Complaint  Patient presents with  . Follow-up    Asthma    Constitutional:  There were no vitals taken for this visit.  Deferred  Past Medical History:  Chronic pain, Lymphedema, Allergies, Neuropathy, Tachycardia, Osteoporosis, IBS, GERD, Depression, Positive ANA  Brief Summary:  Autumn Davis is a 33 y.o. female with allergic asthma.  Virtual Visit via Video Note  I connected with Autumn Davis on 10/17/19 at  4:00 PM EST by a video enabled telemedicine application and verified that I am speaking with the correct person using two identifiers.  Location: Patient: home Provider: medical office   I discussed the limitations of evaluation and management by telemedicine and the availability of in person appointments. The patient expressed understanding and agreed to proceed.  She had a couple flare ups over the past few months.  These have been mild.  Resolved with use of albuterol.  Has mild scratch throat for past few days.  Not having sinus congestion, fever, chest congestion, wheeze, sputum.  Tries to avoid going outside unless she needs to.   Physical Exam:  No distress.  No accessory muscle use.  Mild, raspy voice.  No audible stridor.   Assessment/Plan:   Moderate, persistent asthma. - continue symbicort, singulair - prn albuterol  Allergic rhinitis. - continue zyrtec, singulair, qnasal  Chronic prednisone therapy. - not on this for any pulmonary related issues - she will f/u with other providers to discuss plan to slowly taper off of prednisone   Patient Instructions  Follow up in 1 year    I discussed the assessment and treatment plan with the patient. The patient was provided an opportunity to ask questions and all were answered. The patient agreed with the plan and demonstrated an understanding of the instructions.   The patient was advised to call back or seek an in-person evaluation if the  symptoms worsen or if the condition fails to improve as anticipated.  I provided 16 minutes of non-face-to-face time during this encounter.   Chesley Mires, MD [facility] Pager: [phone] 10/17/2019, 4:16 PM  Flow Sheet     Pulmonary tests:  PFT 05/05/14 >> FEV1 1.60 (44%), FEV1% 69, TLC 3.97 (70%), difficulty with test maneuvers HRCT chest 07/21/14 >> normal PFT 01/05/19 >> FEV1 2.76 (78%), FEV1% 80, TLC 3.95 (69%), DLCO 86%, + BD  Sleep tests:  HST 01/05/19 >> AHI 3.4, SpO2 low 83%  Cardiac tests:  CPST 08/18/14 >> circulatory and muscular limitations Echo 09/15/14 >> EF 60 to 65%  Medications:   Allergies as of 10/17/2019      Reactions   Ciprofloxacin Other (See Comments)   Muscle weakness and numbness   Compazine Other (See Comments)   hallucinations   Prochlorperazine Other (See Comments), Rash   Pt states it makes her feel like things are crawling on her      Medication List       Accurate as of October 17, 2019  4:16 PM. If you have any questions, ask your nurse or doctor.        acetaZOLAMIDE 250 MG tablet Commonly known as: DIAMOX TAKE 1 TABLET EVERY MORNING, TAKE 1 TABLET AT 2PM, AND TAKE 2 TABLETSAT BEDTIME   amitriptyline 75 MG tablet Commonly known as: ELAVIL Take 1 tablet (75 mg total) by mouth at bedtime.   atorvastatin 40 MG tablet Commonly known as: LIPITOR TAKE ONE (1) TABLET EACH DAY   B12 FAST DISSOLVE PO Take  by mouth 2 (two) times daily.   baclofen 20 MG tablet Commonly known as: LIORESAL TAKE ONE TABLET FOUR TIMES DAILY   budesonide-formoterol 80-4.5 MCG/ACT inhaler Commonly known as: Symbicort Inhale 2 puffs into the lungs 2 (two) times daily for 30 days.   CALCIUM 1200+D3 PO Take 1 tablet by mouth daily.   cetirizine 10 MG tablet Commonly known as: ZYRTEC Take 10 mg by mouth daily.   cycloSPORINE 0.05 % ophthalmic emulsion Commonly known as: RESTASIS Place 1 drop into both eyes 2 (two) times  daily.   diclofenac 75 MG EC tablet Commonly known as: VOLTAREN TAKE ONE TABLET BY MOUTH TWICE DAILY   docusate sodium 100 MG capsule Commonly known as: COLACE Take 100 mg by mouth 2 (two) times daily. PRN   EPINEPHrine 0.3 mg/0.3 mL Soaj injection Commonly known as: EPI-PEN USE AS DIRECTED   furosemide 20 MG tablet Commonly known as: LASIX Take 1 tablet (20 mg total) by mouth daily.   gabapentin 600 MG tablet Commonly known as: NEURONTIN TAKE 1 TABLET 5 TIMES DAILY   ipratropium-albuterol 0.5-2.5 (3) MG/3ML Soln Commonly known as: DUONEB Take 3 mLs by nebulization every 4 (four) hours as needed.   L-Methylfolate 15 MG Tabs Take 1 tablet (15 mg total) by mouth daily.   medroxyPROGESTERone 150 MG/ML injection Commonly known as: DEPO-PROVERA Inject 150 mg into the muscle as directed. Every 2 months   metoprolol succinate 25 MG 24 hr tablet Commonly known as: TOPROL-XL TAKE ONE (1) TABLET EACH DAY   montelukast 10 MG tablet Commonly known as: SINGULAIR TAKE ONE TABLET DAILY AT BEDTIME   mupirocin ointment 2 % Commonly known as: Bactroban Apply 1 application topically 2 (two) times daily.   nystatin 100000 UNIT/ML suspension Commonly known as: MYCOSTATIN Use as directed 5 mLs (500,000 Units total) in the mouth or throat 4 (four) times daily.   nystatin-triamcinolone ointment Commonly known as: MYCOLOG Apply 1 application topically 2 (two) times daily.   Omega 3 1000 MG Caps Take 1 capsule by mouth daily.   oxyCODONE-acetaminophen 7.5-325 MG tablet Commonly known as: Percocet Take 1 tablet by mouth 5 (five) times daily as needed for moderate pain. No More Than 5 a day.   predniSONE 5 MG tablet Commonly known as: DELTASONE TAKE ONE TABLET EVERY MORNING   promethazine 12.5 MG tablet Commonly known as: PHENERGAN Take 1 tablet (12.5 mg total) by mouth every 8 (eight) hours as needed for nausea or vomiting.   Qnasl 80 MCG/ACT Aers Generic drug:  Beclomethasone Dipropionate USE 2 SPRAYS IN EACH NOSTRIL DAILY   sulfamethoxazole-trimethoprim 800-160 MG tablet Commonly known as: Bactrim DS Take 1 tablet by mouth 2 (two) times daily.   tamsulosin 0.4 MG Caps capsule Commonly known as: FLOMAX TAKE ONE (1) CAPSULE EACH DAY   Ventolin HFA 108 (90 Base) MCG/ACT inhaler Generic drug: albuterol USE 2 PUFFS EVERY 4 TO 6 HOURS AS NEEDED   Vitamin D3 1.25 MG (50000 UT) Caps Take 50,000 Units by mouth once a week.   vitamin E 400 UNIT capsule Generic drug: vitamin E Take 1,000 Units by mouth daily.       Past Surgical History:  She  has a past surgical history that includes Knee surgery (2002/2003); Rectal surgery (correction of prolapse); Colonoscopy; Esophageal manometry (N/A, 10/29/2015); and 24 hour ph study (N/A, 10/29/2015).  Family History:  Her family history includes Cancer in her paternal grandmother; Colon polyps in her father; Irritable bowel syndrome in her brother; Lung cancer in  her maternal grandmother; Thyroid disease in her mother.  Social History:  She  reports that she has never smoked. She has never used smokeless tobacco. She reports that she does not drink alcohol or use drugs.

## 2019-10-18 ENCOUNTER — Telehealth: Payer: Self-pay | Admitting: *Deleted

## 2019-10-18 NOTE — Telephone Encounter (Signed)
Oral swab drug screen was consistent for prescribed medications.  ?

## 2019-10-24 ENCOUNTER — Other Ambulatory Visit: Payer: Self-pay

## 2019-10-25 ENCOUNTER — Encounter: Payer: Self-pay | Admitting: Nurse Practitioner

## 2019-10-25 ENCOUNTER — Ambulatory Visit: Payer: Managed Care, Other (non HMO) | Admitting: Nurse Practitioner

## 2019-10-25 ENCOUNTER — Ambulatory Visit (INDEPENDENT_AMBULATORY_CARE_PROVIDER_SITE_OTHER): Payer: Managed Care, Other (non HMO) | Admitting: Nurse Practitioner

## 2019-10-25 VITALS — BP 126/82 | HR 80 | Temp 98.0°F | Resp 20 | Ht 67.0 in | Wt 204.0 lb

## 2019-10-25 DIAGNOSIS — E079 Disorder of thyroid, unspecified: Secondary | ICD-10-CM

## 2019-10-25 DIAGNOSIS — R609 Edema, unspecified: Secondary | ICD-10-CM

## 2019-10-25 DIAGNOSIS — R350 Frequency of micturition: Secondary | ICD-10-CM

## 2019-10-25 DIAGNOSIS — K581 Irritable bowel syndrome with constipation: Secondary | ICD-10-CM

## 2019-10-25 DIAGNOSIS — J42 Unspecified chronic bronchitis: Secondary | ICD-10-CM | POA: Diagnosis not present

## 2019-10-25 DIAGNOSIS — D8989 Other specified disorders involving the immune mechanism, not elsewhere classified: Secondary | ICD-10-CM

## 2019-10-25 DIAGNOSIS — Z3042 Encounter for surveillance of injectable contraceptive: Secondary | ICD-10-CM

## 2019-10-25 DIAGNOSIS — I479 Paroxysmal tachycardia, unspecified: Secondary | ICD-10-CM

## 2019-10-25 DIAGNOSIS — E782 Mixed hyperlipidemia: Secondary | ICD-10-CM

## 2019-10-25 DIAGNOSIS — F3342 Major depressive disorder, recurrent, in full remission: Secondary | ICD-10-CM

## 2019-10-25 DIAGNOSIS — M797 Fibromyalgia: Secondary | ICD-10-CM

## 2019-10-25 DIAGNOSIS — K219 Gastro-esophageal reflux disease without esophagitis: Secondary | ICD-10-CM | POA: Diagnosis not present

## 2019-10-25 DIAGNOSIS — E538 Deficiency of other specified B group vitamins: Secondary | ICD-10-CM | POA: Diagnosis not present

## 2019-10-25 DIAGNOSIS — R1310 Dysphagia, unspecified: Secondary | ICD-10-CM

## 2019-10-25 DIAGNOSIS — M329 Systemic lupus erythematosus, unspecified: Secondary | ICD-10-CM

## 2019-10-25 DIAGNOSIS — M3219 Other organ or system involvement in systemic lupus erythematosus: Secondary | ICD-10-CM

## 2019-10-25 DIAGNOSIS — R569 Unspecified convulsions: Secondary | ICD-10-CM

## 2019-10-25 DIAGNOSIS — R600 Localized edema: Secondary | ICD-10-CM

## 2019-10-25 DIAGNOSIS — Z6831 Body mass index (BMI) 31.0-31.9, adult: Secondary | ICD-10-CM

## 2019-10-25 DIAGNOSIS — Z309 Encounter for contraceptive management, unspecified: Secondary | ICD-10-CM

## 2019-10-25 DIAGNOSIS — E559 Vitamin D deficiency, unspecified: Secondary | ICD-10-CM

## 2019-10-25 DIAGNOSIS — M545 Low back pain, unspecified: Secondary | ICD-10-CM

## 2019-10-25 MED ORDER — BACLOFEN 20 MG PO TABS: ORAL_TABLET | ORAL | 1 refills | Status: DC

## 2019-10-25 MED ORDER — PREDNISONE 5 MG PO TABS: 5.0000 mg | ORAL_TABLET | Freq: Every morning | ORAL | 1 refills | Status: DC

## 2019-10-25 MED ORDER — FUROSEMIDE 20 MG PO TABS: 20.0000 mg | ORAL_TABLET | Freq: Every day | ORAL | 1 refills | Status: DC

## 2019-10-25 MED ORDER — ATORVASTATIN CALCIUM 40 MG PO TABS: ORAL_TABLET | ORAL | 1 refills | Status: DC

## 2019-10-25 MED ORDER — AMITRIPTYLINE HCL 75 MG PO TABS: 75.0000 mg | ORAL_TABLET | Freq: Every day | ORAL | 3 refills | Status: DC

## 2019-10-25 MED ORDER — ACETAZOLAMIDE 250 MG PO TABS: ORAL_TABLET | ORAL | 1 refills | Status: DC

## 2019-10-25 MED ORDER — GABAPENTIN 600 MG PO TABS: 600.0000 mg | ORAL_TABLET | Freq: Every day | ORAL | 1 refills | Status: DC

## 2019-10-25 MED ORDER — TAMSULOSIN HCL 0.4 MG PO CAPS: ORAL_CAPSULE | ORAL | 1 refills | Status: DC

## 2019-10-25 MED ORDER — METOPROLOL SUCCINATE ER 25 MG PO TB24: ORAL_TABLET | ORAL | 1 refills | Status: DC

## 2019-10-25 NOTE — Progress Notes (Signed)
Subjective:    Patient ID: Autumn Davis, female    DOB: November 29, 1985, 33 y.o.   MRN: [id number]   Chief Complaint: medical management of chronic issues   HPI:  1. Tachycardia, paroxysmal (HCC) Usually runs 90-115 most of the time. She tries to avoid caffeine  2. Chronic bronchitis, unspecified chronic bronchitis type (HCC) Cough comes and goes but the symbicort really helps. Takes singulair daily.  3. Gastroesophageal reflux disease without esophagitis Insurance wont cover what she needs. She is taking omeprazole daily but doesn't really help.  4. Irritable bowel syndrome with constipation Has occasional flare ups- usually stress related. Baclofen and elavil working well.  5. Dysphagia, unspecified type Has not been having in troubles lately  6. Thyroid disease currently not on anything for thyroid. TSH has been stable. Lab Results  Component Value Date   TSH 1.200 07/22/2019     7. Seizures (HCC) Is on diamox daily and has not hd any seizure activity.  8. Recurrent major depressive disorder, in full remission [facility]) She has had some depression due to being stuck in the house so much. Depression screen [facility] 2/9 10/25/2019 07/22/2019 05/12/2019  Decreased Interest 0 0 1  Down, Depressed, Hopeless 0 0 1  PHQ - 2 Score 0 0 2  Altered sleeping - - -  Tired, decreased energy - - -  Change in appetite - - -  Feeling bad or failure about yourself  - - -  Trouble concentrating - - -  Moving slowly or fidgety/restless - - -  Suicidal thoughts - - -  PHQ-9 Score - - -  Some recent data might be hidden     9. Back pain, lumbosacral *still having back pan and goes to pain management  10. Mixed hyperlipidemia tries to watch diet, but has not been doing so well lately  11. Fibromyalgia Has daily pain. Goes to pain management monthly  12. Systemic lupus erythematosus, unspecified SLE type, unspecified organ involvement status (HCC) Again they still re not sure if she has  lupus. Sometimes her test shows lupus but other times it has not. 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 Has good days and bad days. Some days she cont get out of bed. Really struggles  14. Vitamin D deficiency takes a daily vitamin d supplement.  15. Low B12 Use to take b12 injections ut as been taking oral meds and has ben experiening some fatigue.  16. BMI  Weight has gone up over 10lbs since last visit Wt Readings from Last 3 Encounters:  10/25/19 204 lb (92.5 kg)  09/02/19 200 lb (90.7 kg)  08/03/19 201 lb (91.2 kg)   BMI Readings from Last 3 Encounters:  10/25/19 31.95 kg/m  09/02/19 30.86 kg/m  08/03/19 31.48 kg/m       Outpatient Encounter Medications as of 10/25/2019  Medication Sig  . acetaZOLAMIDE (DIAMOX) 250 MG tablet TAKE 1 TABLET EVERY MORNING, TAKE 1 TABLET AT 2PM, AND TAKE 2 TABLETSAT BEDTIME  . amitriptyline (ELAVIL) 75 MG tablet Take 1 tablet (75 mg total) by mouth at bedtime.  Marland Kitchen atorvastatin (LIPITOR) 40 MG tablet TAKE ONE (1) TABLET EACH DAY  . baclofen (LIORESAL) 20 MG tablet TAKE ONE TABLET FOUR TIMES DAILY  . budesonide-formoterol (SYMBICORT) 80-4.5 MCG/ACT inhaler Inhale 2 puffs into the lungs 2 (two) times daily for 30 days.  . Calcium-Magnesium-Vitamin D (CALCIUM 1200+D3 PO) Take 1 tablet by mouth daily.  . cetirizine (ZYRTEC) 10  MG tablet Take 10 mg by mouth daily.  . Cholecalciferol (VITAMIN D3) 50000 units CAPS Take 50,000 Units by mouth once a week.  . Cyanocobalamin (B12 FAST DISSOLVE PO) Take by mouth 2 (two) times daily.  . cycloSPORINE (RESTASIS) 0.05 % ophthalmic emulsion Place 1 drop into both eyes 2 (two) times daily.  . diclofenac (VOLTAREN) 75 MG EC tablet TAKE ONE TABLET BY MOUTH TWICE DAILY  . docusate sodium (COLACE) 100 MG capsule Take 100 mg by mouth 2 (two) times daily. PRN  . EPINEPHRINE 0.3 mg/0.3 mL IJ SOAJ injection USE AS DIRECTED  . furosemide (LASIX) 20 MG  tablet Take 1 tablet (20 mg total) by mouth daily.  Marland Kitchen gabapentin (NEURONTIN) 600 MG tablet TAKE 1 TABLET 5 TIMES DAILY  . ipratropium-albuterol (DUONEB) 0.5-2.5 (3) MG/3ML SOLN Take 3 mLs by nebulization every 4 (four) hours as needed.  Marland Kitchen L-Methylfolate 15 MG TABS Take 1 tablet (15 mg total) by mouth daily.  . medroxyPROGESTERone (DEPO-PROVERA) 150 MG/ML injection Inject 150 mg into the muscle as directed. Every 2 months  . metoprolol succinate (TOPROL-XL) 25 MG 24 hr tablet TAKE ONE (1) TABLET EACH DAY  . montelukast (SINGULAIR) 10 MG tablet TAKE ONE TABLET DAILY AT BEDTIME  . mupirocin ointment (BACTROBAN) 2 % Apply 1 application topically 2 (two) times daily.  Marland Kitchen nystatin (MYCOSTATIN) 100000 UNIT/ML suspension Use as directed 5 mLs (500,000 Units total) in the mouth or throat 4 (four) times daily.  Marland Kitchen nystatin-triamcinolone ointment (MYCOLOG) Apply 1 application topically 2 (two) times daily.  . Omega 3 1000 MG CAPS Take 1 capsule by mouth daily.  Marland Kitchen oxyCODONE-acetaminophen (PERCOCET) 7.5-325 MG tablet Take 1 tablet by mouth 5 (five) times daily as needed for moderate pain. No More Than 5 a day.  . predniSONE (DELTASONE) 5 MG tablet TAKE ONE TABLET EVERY MORNING  . promethazine (PHENERGAN) 12.5 MG tablet Take 1 tablet (12.5 mg total) by mouth every 8 (eight) hours as needed for nausea or vomiting.  . QNASL 80 MCG/ACT AERS USE 2 SPRAYS IN EACH NOSTRIL DAILY  . sulfamethoxazole-trimethoprim (BACTRIM DS) 800-160 MG tablet Take 1 tablet by mouth 2 (two) times daily.  . tamsulosin (FLOMAX) 0.4 MG CAPS capsule TAKE ONE (1) CAPSULE EACH DAY  . VENTOLIN HFA 108 (90 Base) MCG/ACT inhaler USE 2 PUFFS EVERY 4 TO 6 HOURS AS NEEDED  . vitamin E (VITAMIN E) 400 UNIT capsule Take 1,000 Units by mouth daily.      Past Surgical History:  Procedure Laterality Date  . 24 HOUR PH STUDY N/A 10/29/2015   Procedure: 24 HOUR PH STUDY;  Surgeon: Iva Boop, MD;  Location: [facility];  Service: Endoscopy;   Laterality: N/A;  . COLONOSCOPY    . ESOPHAGEAL MANOMETRY N/A 10/29/2015   Procedure: ESOPHAGEAL MANOMETRY (EM);  Surgeon: Iva Boop, MD;  Location: [facility];  Service: Endoscopy;  Laterality: N/A;  . KNEE SURGERY  2002/2003   bil  . RECTAL SURGERY  correction of prolapse   2010    Family History  Problem Relation Age of Onset  . Thyroid disease Mother   . Colon polyps Father   . Lung cancer Maternal Grandmother   . Cancer Paternal Grandmother        colon/pancreatiec/lymphoma  . Irritable bowel syndrome Brother   . AAA (abdominal aortic aneurysm) Neg Hx     New complaints: None today  Social history: Lives with her parents right now  Controlled substance contract: n/a    Review  of Systems  Constitutional: Negative for activity change and appetite change.  HENT: Negative.   Eyes: Negative for pain.  Respiratory: Negative for shortness of breath.   Cardiovascular: Negative for chest pain, palpitations and leg swelling.  Gastrointestinal: Negative for abdominal pain.  Endocrine: Negative for polydipsia.  Genitourinary: Negative.   Skin: Negative for rash.  Neurological: Negative for dizziness, weakness and headaches.  Hematological: Does not bruise/bleed easily.  Psychiatric/Behavioral: Negative.   All other systems reviewed and are negative.      Objective:   Physical Exam Vitals signs and nursing note reviewed.  Constitutional:      General: She is not in acute distress.    Appearance: Normal appearance. She is well-developed.  HENT:     Head: Normocephalic.     Nose: Nose normal.  Eyes:     Pupils: Pupils are equal, round, and reactive to light.  Neck:     Musculoskeletal: Normal range of motion and neck supple.     Vascular: No carotid bruit or JVD.  Cardiovascular:     Rate and Rhythm: Normal rate and regular rhythm.     Heart sounds: Normal heart sounds.  Pulmonary:     Effort: Pulmonary effort is normal. No respiratory distress.      Breath sounds: Normal breath sounds. No wheezing or rales.  Chest:     Chest wall: No tenderness.  Abdominal:     General: Bowel sounds are normal. There is no distension or abdominal bruit.     Palpations: Abdomen is soft. There is no hepatomegaly, splenomegaly, mass or pulsatile mass.     Tenderness: There is no abdominal tenderness.  Musculoskeletal: Normal range of motion.  Lymphadenopathy:     Cervical: No cervical adenopathy.  Skin:    General: Skin is warm and dry.  Neurological:     Mental Status: She is alert and oriented to person, place, and time.     Deep Tendon Reflexes: Reflexes are normal and symmetric.  Psychiatric:        Behavior: Behavior normal.        Thought Content: Thought content normal.        Judgment: Judgment normal.       BP 126/82   Pulse 80   Temp 98 F (36.7 C) (Temporal)   Resp 20   Ht 5\' 7"  (1.702 m)   Wt 204 lb (92.5 kg)   SpO2 99%   BMI 31.95 kg/m      Assessment & Plan:  Elzora Blaski comes in today with chief complaint of Medical Management of Chronic Issues   Diagnosis and orders addressed:  1. Tachycardia, paroxysmal (HCC) Avoid caffeine - metoprolol succinate (TOPROL-XL) 25 MG 24 hr tablet; TAKE ONE (1) TABLET EACH DAY  Dispense: 90 tablet; Refill: 1  2. Chronic bronchitis, unspecified chronic bronchitis type (HCC) Continue inhaler  3. Gastroesophageal reflux disease without esophagitis Avoid spicy foods Do not eat 2 hours prior to bedtime  4. Irritable bowel syndrome with constipation Watch diet that cause flare ups - baclofen (LIORESAL) 20 MG tablet; TAKE ONE TABLET FOUR TIMES DAILY  Dispense: 360 each; Refill: 1 - amitriptyline (ELAVIL) 75 MG tablet; Take 1 tablet (75 mg total) by mouth at bedtime.  Dispense: 90 tablet; Refill: 3  5. Dysphagia, unspecified type Chew food well to prevent strangling  6. Thyroid disease Continue seeing DR. Balan  7. Seizures (HCC) Will fill out DMV papers to get drivers license  back - acetaZOLAMIDE (DIAMOX) 250 MG tablet;  TAKE 1 TABLET EVERY MORNING, TAKE 1 TABLET AT 2PM, AND TAKE 2 TABLETSAT BEDTIME  Dispense: 360 tablet; Refill: 1  8. Recurrent major depressive disorder, in full remission (HCC) Doing well.  9. Back pain, lumbosacral Continue with pian management  10. Mixed hyperlipidemia Low fat diet - atorvastatin (LIPITOR) 40 MG tablet; TAKE ONE (1) TABLET EACH DAY  Dispense: 90 tablet; Refill: 1  11. Fibromyalgia Continue with pain management Exercise to keep muscles warm  12. 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 Vitamin D deficiency Continue daly vitamin D  15. BMI 31.0-31.9,adult Discussed diet and exercise for person with BMI >25 Will recheck weight in 3-6 months  16. Peripheral edema Elevate legs when sitting - furosemide (LASIX) 20 MG tablet; Take 1 tablet (20 mg total) by mouth daily.  Dispense: 90 tablet; Refill: 1  17. Systemic lupus erythematosus with other organ involvement, unspecified SLE type (HCC) - predniSONE (DELTASONE) 5 MG tablet; Take 1 tablet (5 mg total) by mouth every morning.  Dispense: 90 tablet; Refill: 1 - gabapentin (NEURONTIN) 600 MG tablet; Take 1 tablet (600 mg total) by mouth 5 (five) times daily.  Dispense: 450 tablet; Refill: 1  18. Urinary frequency - tamsulosin (FLOMAX) 0.4 MG CAPS capsule; TAKE ONE (1) CAPSULE EACH DAY  Dispense: 90 capsule; Refill: 1  19. B12 deficiency Will repeat labs today - Vitamin B12   Labs pending Health Maintenance reviewed Diet and exercise encouraged  Follow up plan: 3 months   Mary-Margaret Daphine Deutscher, FNP

## 2019-10-26 LAB — SPECIMEN STATUS

## 2019-10-26 LAB — LIPID PANEL
Chol/HDL Ratio: 3 ratio (ref 0.0–4.4)
Cholesterol, Total: 186 mg/dL (ref 100–199)
HDL: 62 mg/dL (ref >39)
LDL Chol Calc (NIH): 93 mg/dL (ref 0–99)
Triglycerides: 182 mg/dL — ABNORMAL HIGH (ref 0–149)
VLDL Cholesterol Cal: 31 mg/dL (ref 5–40)

## 2019-10-26 LAB — CMP14+EGFR
ALT: 20 IU/L (ref 0–32)
AST: 19 IU/L (ref 0–40)
Albumin/Globulin Ratio: 2.1 (ref 1.2–2.2)
Albumin: 4.5 g/dL (ref 3.8–4.8)
Alkaline Phosphatase: 76 IU/L (ref 39–117)
BUN/Creatinine Ratio: 11 (ref 9–23)
BUN: 9 mg/dL (ref 6–20)
Bilirubin Total: 0.3 mg/dL (ref 0.0–1.2)
CO2: 20 mmol/L (ref 20–29)
Calcium: 9.2 mg/dL (ref 8.7–10.2)
Chloride: 107 mmol/L — ABNORMAL HIGH (ref 96–106)
Creatinine, Ser: 0.82 mg/dL (ref 0.57–1.00)
GFR calc Af Amer: 107 mL/min/{1.73_m2} (ref >59)
GFR calc non Af Amer: 93 mL/min/{1.73_m2} (ref >59)
Globulin, Total: 2.1 g/dL (ref 1.5–4.5)
Sodium: 143 mmol/L (ref 134–144)
Total Protein: 6.6 g/dL (ref 6.0–8.5)

## 2019-10-26 LAB — THYROID PANEL WITH TSH
Free Thyroxine Index: 2 (ref 1.2–4.9)
T3 Uptake Ratio: 31 % (ref 24–39)
T4, Total: 6.4 ug/dL (ref 4.5–12.0)
TSH: 0.783 u[IU]/mL (ref 0.450–4.500)

## 2019-10-26 LAB — VITAMIN B12: Vitamin B-12: 948 pg/mL (ref 232–1245)

## 2019-10-26 MED ORDER — MEDROXYPROGESTERONE ACETATE 150 MG/ML IM SUSP
150.0000 mg | Freq: Once | INTRAMUSCULAR | Status: AC
  Administered 2019-10-25: 150 mg via INTRAMUSCULAR

## 2019-10-26 NOTE — Addendum Note (Signed)
Addended by: Rolena Infante on: 10/26/2019 09:53 AM   Modules accepted: Orders

## 2019-10-31 ENCOUNTER — Other Ambulatory Visit: Payer: Self-pay | Admitting: Nurse Practitioner

## 2019-10-31 DIAGNOSIS — D519 Vitamin B12 deficiency anemia, unspecified: Secondary | ICD-10-CM

## 2019-11-01 ENCOUNTER — Other Ambulatory Visit: Payer: Self-pay

## 2019-11-01 MED ORDER — CYANOCOBALAMIN 1000 MCG/ML IJ SOLN: 1000.0000 ug | INTRAMUSCULAR | 1 refills | Status: DC

## 2019-11-07 ENCOUNTER — Other Ambulatory Visit: Payer: Self-pay | Admitting: Nurse Practitioner

## 2019-11-08 ENCOUNTER — Encounter: Payer: Self-pay | Admitting: Registered Nurse

## 2019-11-08 ENCOUNTER — Other Ambulatory Visit: Payer: Self-pay

## 2019-11-08 ENCOUNTER — Encounter: Payer: Managed Care, Other (non HMO) | Attending: Registered Nurse | Admitting: Registered Nurse

## 2019-11-08 VITALS — BP 131/85 | Temp 97.7°F | Ht 67.0 in | Wt 201.0 lb

## 2019-11-08 DIAGNOSIS — G8929 Other chronic pain: Secondary | ICD-10-CM

## 2019-11-08 DIAGNOSIS — M545 Low back pain: Secondary | ICD-10-CM | POA: Insufficient documentation

## 2019-11-08 DIAGNOSIS — D8989 Other specified disorders involving the immune mechanism, not elsewhere classified: Secondary | ICD-10-CM

## 2019-11-08 DIAGNOSIS — M62838 Other muscle spasm: Secondary | ICD-10-CM | POA: Diagnosis present

## 2019-11-08 DIAGNOSIS — M7062 Trochanteric bursitis, left hip: Secondary | ICD-10-CM | POA: Diagnosis not present

## 2019-11-08 DIAGNOSIS — Z5181 Encounter for therapeutic drug level monitoring: Secondary | ICD-10-CM | POA: Diagnosis present

## 2019-11-08 DIAGNOSIS — M6249 Contracture of muscle, multiple sites: Secondary | ICD-10-CM | POA: Insufficient documentation

## 2019-11-08 DIAGNOSIS — M797 Fibromyalgia: Secondary | ICD-10-CM | POA: Insufficient documentation

## 2019-11-08 DIAGNOSIS — M546 Pain in thoracic spine: Secondary | ICD-10-CM

## 2019-11-08 DIAGNOSIS — M7061 Trochanteric bursitis, right hip: Secondary | ICD-10-CM

## 2019-11-08 DIAGNOSIS — Z79891 Long term (current) use of opiate analgesic: Secondary | ICD-10-CM | POA: Diagnosis present

## 2019-11-08 DIAGNOSIS — M329 Systemic lupus erythematosus, unspecified: Secondary | ICD-10-CM | POA: Insufficient documentation

## 2019-11-08 DIAGNOSIS — G894 Chronic pain syndrome: Secondary | ICD-10-CM

## 2019-11-08 DIAGNOSIS — G609 Hereditary and idiopathic neuropathy, unspecified: Secondary | ICD-10-CM

## 2019-11-08 DIAGNOSIS — R569 Unspecified convulsions: Secondary | ICD-10-CM | POA: Diagnosis present

## 2019-11-08 MED ORDER — OXYCODONE-ACETAMINOPHEN 7.5-325 MG PO TABS: 1.0000 | ORAL_TABLET | Freq: Every day | ORAL | 0 refills | Status: DC | PRN

## 2019-11-08 NOTE — Progress Notes (Signed)
Subjective:    Patient ID: Autumn Davis, female    DOB: [birth year], 33 y.o.   MRN: [id number]  HPI: Autumn Davis is a 32 y.o. female who returns for follow up appointment for chronic pain and medication refill. She states her pain is located in her left shoulder and bilateral rib pain. Also reports tingling and burning in her toes. She rates her  Pain 6. Her current exercise regime is walking and performing stretching exercises.  [name] reports a week ago she was in her bathroom and lost her footing and hit her left shoulder against the bathtub, she was able to pick herself up. She didn't seek medical attention , but she states she called her PCP. Left Shoulder with ecchymosis resolving. Educated on falls prevention, she verbalizes understanding.   [name] Morphine equivalent is 56.25  MME.  Last Oral Swab was Performed on 10/10/2019, it was consistent.    Pain Inventory Average Pain 8 Pain Right Now 6 My pain is constant, sharp, burning, stabbing, tingling and aching  In the last 24 hours, has pain interfered with the following? General activity 9 Relation with others 6 Enjoyment of life 6 What TIME of day is your pain at its worst? evening, night Sleep (in general) Poor  Pain is worse with: walking, bending, sitting, inactivity, standing and some activites Pain improves with: rest, heat/ice, therapy/exercise, pacing activities, medication and TENS Relief from Meds: 4  Mobility ability to climb steps?  yes do you drive?  no Do you have any goals in this area?  yes  Function disabled: date disabled . I need assistance with the following:  meal prep, household duties and shopping Do you have any goals in this area?  yes  Neuro/Psych bladder control problems bowel control problems weakness numbness tremor tingling trouble walking spasms dizziness  Prior Studies Any changes since last visit?  no  Physicians involved in your care Any changes since last visit?   no   Family History  Problem Relation Age of Onset  . Thyroid disease Mother   . Colon polyps Father   . Lung cancer Maternal Grandmother   . Cancer Paternal Grandmother        colon/pancreatiec/lymphoma  . Irritable bowel syndrome Brother   . AAA (abdominal aortic aneurysm) Neg Hx    Social History   Socioeconomic History  . Marital status: Married    Spouse name: Not on file  . Number of children: Not on file  . Years of education: Not on file  . Highest education level: Not on file  Occupational History  . Occupation: disabled    Employer: [employer]  Tobacco Use  . Smoking status: Never Smoker  . Smokeless tobacco: Never Used  Substance and Sexual Activity  . Alcohol use: No    Alcohol/week: 0.0 standard drinks  . Drug use: No  . Sexual activity: Not on file  Other Topics Concern  . Not on file  Social History Narrative  . Not on file   Social Determinants of Health   Financial Resource Strain:   . Difficulty of Paying Living Expenses: Not on file  Food Insecurity:   . Worried About Programme researcher, broadcasting/film/video in the Last Year: Not on file  . Ran Out of Food in the Last Year: Not on file  Transportation Needs:   . Lack of Transportation (Medical): Not on file  . Lack of Transportation (Non-Medical): Not on file  Physical Activity:   . Days  of Exercise per Week: Not on file  . Minutes of Exercise per Session: Not on file  Stress:   . Feeling of Stress : Not on file  Social Connections:   . Frequency of Communication with Friends and Family: Not on file  . Frequency of Social Gatherings with Friends and Family: Not on file  . Attends Religious Services: Not on file  . Active Member of Clubs or Organizations: Not on file  . Attends Banker Meetings: Not on file  . Marital Status: Not on file   Past Surgical History:  Procedure Laterality Date  . 24 HOUR PH STUDY N/A 10/29/2015   Procedure: 24 HOUR PH STUDY;  Surgeon: Iva Boop,  MD;  Location: [facility];  Service: Endoscopy;  Laterality: N/A;  . COLONOSCOPY    . ESOPHAGEAL MANOMETRY N/A 10/29/2015   Procedure: ESOPHAGEAL MANOMETRY (EM);  Surgeon: Iva Boop, MD;  Location: [facility];  Service: Endoscopy;  Laterality: N/A;  . KNEE SURGERY  2002/2003   bil  . RECTAL SURGERY  correction of prolapse   2010   Past Medical History:  Diagnosis Date  . Allergy   . Arthritis    HANDS,HIPS,KNEES  . Bronchitis, chronic/intermittent 01/22/2012  . Depression 01/22/2012  . GERD (gastroesophageal reflux disease)   . Hyperlipidemia   . IBS (irritable bowel syndrome) 01/22/2012  . Lupus (HCC)   . Neuromuscular disorder (HCC)   . Osteoporosis   . Seizures (HCC)   . SOB (shortness of breath)   . Thyroid disease    BP 131/85   Temp 97.7 F (36.5 C)   Ht 5\' 7"  (1.702 m)   Wt 201 lb (91.2 kg)   BMI 31.48 kg/m   Opioid Risk Score:   Fall Risk Score:  `1  Depression screen PHQ 2/9  Depression screen [facility] 2/9 10/25/2019 07/22/2019 05/12/2019 03/17/2019 11/05/2018 08/13/2018 05/13/2018  Decreased Interest 0 0 1 0 1 0 1  Down, Depressed, Hopeless 0 0 1 0 0 0 0  PHQ - 2 Score 0 0 2 0 1 0 1  Altered sleeping - - - - - - -  Tired, decreased energy - - - - - - -  Change in appetite - - - - - - -  Feeling bad or failure about yourself  - - - - - - -  Trouble concentrating - - - - - - -  Moving slowly or fidgety/restless - - - - - - -  Suicidal thoughts - - - - - - -  PHQ-9 Score - - - - - - -  Some recent data might be hidden     Review of Systems  Constitutional: Negative.   HENT: Negative.   Eyes: Negative.   Respiratory: Negative.   Cardiovascular: Negative.   Gastrointestinal: Negative.   Endocrine: Negative.   Genitourinary: Positive for difficulty urinating.  Musculoskeletal: Positive for arthralgias and back pain.       Spasms   Skin: Negative.   Allergic/Immunologic: Negative.   Neurological: Positive for dizziness, tremors and weakness.        Tingling   Hematological: Negative.   Psychiatric/Behavioral: Negative.   All other systems reviewed and are negative.      Objective:   Physical Exam Vitals and nursing note reviewed.  Constitutional:      Appearance: Normal appearance.  Cardiovascular:     Rate and Rhythm: Normal rate and regular rhythm.     Pulses: Normal pulses.  Heart sounds: Normal heart sounds.  Pulmonary:     Effort: Pulmonary effort is normal.     Breath sounds: Normal breath sounds.  Musculoskeletal:     Cervical back: Normal range of motion and neck supple.     Comments: Normal Muscle Bulk and Muscle Testing Reveals:  Upper Extremities: Right:  FullROM and Muscle Strength 5/5 Left: Decreased ROM 45 Degrees and Muscle Strength 4/5 Left AC Joint Tenderness  Thoracic Paraspinal Tenderness: T-1-T-3 T-7-T-9 Lower Extremities: Full ROM and Muscle Strength 5/5 Arises from chair slowly Narrow Based Gait   Skin:    General: Skin is warm and dry.  Neurological:     Mental Status: She is alert and oriented to person, place, and time.  Psychiatric:        Mood and Affect: Mood normal.        Behavior: Behavior normal.           Assessment & Plan:  1.Chronic seizure disorder: No seizure's. Continuecurrent medication regimen withGabapentin. Neurology Following.11/08/2019. 2. Chronic muscle spasms, weakness with associated pain disorder: Continuecurrent medication regimen withBaclofen. Continue with Exercise regime.11/08/2019. 3. Chronic dysphagia:No complaints today. Continue to monitor. GI Following.11/08/2019. 4. Anxiety with depression : Stable. Continuecurrent medication regimen withElavil.11/08/2019 5. Fibromyalgia/ Rib Pain/Chronic Pain:Continue Diclofenac: Continue with exercise and heat Therapy.11/08/2019. Refilled: oxyCODONE 7.5/325mg  onetablet 5 times daily as needed #150. Continue with slow weaning.We will continue opioid monitoring program, this consists of regular  clinic visits, examinations, urine drug screen, pill counts as well as use of [place] Controlled Substance Reporting System. 6. Peripheral Neuropathy:Continuecurrent medication regimen withGabapentin:11/08/2019  of face to face patient care time was spent during this visit. All questions were encouraged and answered.  F/U in 1 month

## 2019-11-16 ENCOUNTER — Other Ambulatory Visit: Payer: Self-pay | Admitting: Nurse Practitioner

## 2020-01-10 ENCOUNTER — Encounter: Payer: Self-pay | Admitting: Registered Nurse

## 2020-01-10 ENCOUNTER — Encounter: Payer: Medicare Other | Attending: Registered Nurse | Admitting: Registered Nurse

## 2020-01-10 ENCOUNTER — Other Ambulatory Visit: Payer: Self-pay

## 2020-01-10 VITALS — BP 128/83 | HR 73 | Temp 97.7°F | Ht 67.0 in | Wt 208.0 lb

## 2020-01-10 DIAGNOSIS — G609 Hereditary and idiopathic neuropathy, unspecified: Secondary | ICD-10-CM

## 2020-01-10 DIAGNOSIS — Z5181 Encounter for therapeutic drug level monitoring: Secondary | ICD-10-CM | POA: Diagnosis not present

## 2020-01-10 DIAGNOSIS — G8929 Other chronic pain: Secondary | ICD-10-CM

## 2020-01-10 DIAGNOSIS — M62838 Other muscle spasm: Secondary | ICD-10-CM | POA: Diagnosis not present

## 2020-01-10 DIAGNOSIS — M546 Pain in thoracic spine: Secondary | ICD-10-CM

## 2020-01-10 DIAGNOSIS — D8989 Other specified disorders involving the immune mechanism, not elsewhere classified: Secondary | ICD-10-CM | POA: Diagnosis not present

## 2020-01-10 DIAGNOSIS — M545 Low back pain: Secondary | ICD-10-CM | POA: Diagnosis not present

## 2020-01-10 DIAGNOSIS — G894 Chronic pain syndrome: Secondary | ICD-10-CM | POA: Diagnosis not present

## 2020-01-10 DIAGNOSIS — M542 Cervicalgia: Secondary | ICD-10-CM | POA: Diagnosis not present

## 2020-01-10 DIAGNOSIS — M9901 Segmental and somatic dysfunction of cervical region: Secondary | ICD-10-CM | POA: Diagnosis not present

## 2020-01-10 DIAGNOSIS — M797 Fibromyalgia: Secondary | ICD-10-CM | POA: Diagnosis not present

## 2020-01-10 DIAGNOSIS — R569 Unspecified convulsions: Secondary | ICD-10-CM | POA: Diagnosis not present

## 2020-01-10 DIAGNOSIS — Z79891 Long term (current) use of opiate analgesic: Secondary | ICD-10-CM | POA: Insufficient documentation

## 2020-01-10 DIAGNOSIS — M6249 Contracture of muscle, multiple sites: Secondary | ICD-10-CM | POA: Diagnosis not present

## 2020-01-10 DIAGNOSIS — M329 Systemic lupus erythematosus, unspecified: Secondary | ICD-10-CM | POA: Insufficient documentation

## 2020-01-10 DIAGNOSIS — M9903 Segmental and somatic dysfunction of lumbar region: Secondary | ICD-10-CM | POA: Diagnosis not present

## 2020-01-10 DIAGNOSIS — M9902 Segmental and somatic dysfunction of thoracic region: Secondary | ICD-10-CM | POA: Diagnosis not present

## 2020-01-10 MED ORDER — OXYCODONE-ACETAMINOPHEN 7.5-325 MG PO TABS: 1.0000 | ORAL_TABLET | Freq: Every day | ORAL | 0 refills | Status: DC | PRN

## 2020-01-10 NOTE — Progress Notes (Signed)
Subjective:    Patient ID: Autumn Davis, female    DOB: [birth year], 34 y.o.   MRN: [id number]  HPI: Autumn Davis is a 34 y.o. female who returns for follow up appointment for chronic pain and medication refill. She states her pain is located in her mid- lower back and also reports occasional facial burning and tingling, at this time no facial burning . She rates her pain 6. Her current exercise regime is walking, lifting light weights  and performing stretching exercises.  Autumn Davis Morphine equivalent is 56.25  MME.    Last Oral Swab was Performed on 10/10/2019, it was consistent.    Pain Inventory Average Pain 8 Pain Right Now 6 My pain is constant, sharp, burning, stabbing, tingling and aching  In the last 24 hours, has pain interfered with the following? General activity 8 Relation with others 6 Enjoyment of life 7 What TIME of day is your pain at its worst? evening , night Sleep (in general) Poor  Pain is worse with: walking, bending, sitting, inactivity, standing and some activites Pain improves with: rest, heat/ice, therapy/exercise, pacing activities, medication and TENS Relief from Meds: 4  Mobility ability to climb steps?  yes do you drive?  no Do you have any goals in this area?  yes  Function I need assistance with the following:  meal prep, household duties and shopping Do you have any goals in this area?  yes  Neuro/Psych bladder control problems bowel control problems weakness numbness tremor tingling trouble walking spasms dizziness  Prior Studies Any changes since last visit?  no  Physicians involved in your care Any changes since last visit?  no   Family History  Problem Relation Age of Onset  . Thyroid disease Mother   . Colon polyps Father   . Lung cancer Maternal Grandmother   . Cancer Paternal Grandmother        colon/pancreatiec/lymphoma  . Irritable bowel syndrome Brother   . AAA (abdominal aortic aneurysm) Neg Hx    Social  History   Socioeconomic History  . Marital status: Married    Spouse name: Not on file  . Number of children: Not on file  . Years of education: Not on file  . Highest education level: Not on file  Occupational History  . Occupation: disabled    Employer: [employer]  Tobacco Use  . Smoking status: Never Smoker  . Smokeless tobacco: Never Used  Substance and Sexual Activity  . Alcohol use: No    Alcohol/week: 0.0 standard drinks  . Drug use: No  . Sexual activity: Not on file  Other Topics Concern  . Not on file  Social History Narrative  . Not on file   Social Determinants of Health   Financial Resource Strain:   . Difficulty of Paying Living Expenses: Not on file  Food Insecurity:   . Worried About Programme researcher, broadcasting/film/video in the Last Year: Not on file  . Ran Out of Food in the Last Year: Not on file  Transportation Needs:   . Lack of Transportation (Medical): Not on file  . Lack of Transportation (Non-Medical): Not on file  Physical Activity:   . Days of Exercise per Week: Not on file  . Minutes of Exercise per Session: Not on file  Stress:   . Feeling of Stress : Not on file  Social Connections:   . Frequency of Communication with Friends and Family: Not on file  . Frequency of Social Gatherings  with Friends and Family: Not on file  . Attends Religious Services: Not on file  . Active Member of Clubs or Organizations: Not on file  . Attends Banker Meetings: Not on file  . Marital Status: Not on file   Past Surgical History:  Procedure Laterality Date  . 24 HOUR PH STUDY N/A 10/29/2015   Procedure: 24 HOUR PH STUDY;  Surgeon: Iva Boop, MD;  Location: [facility];  Service: Endoscopy;  Laterality: N/A;  . COLONOSCOPY    . ESOPHAGEAL MANOMETRY N/A 10/29/2015   Procedure: ESOPHAGEAL MANOMETRY (EM);  Surgeon: Iva Boop, MD;  Location: [facility];  Service: Endoscopy;  Laterality: N/A;  . KNEE SURGERY  2002/2003   bil  . RECTAL  SURGERY  correction of prolapse   2010   Past Medical History:  Diagnosis Date  . Allergy   . Arthritis    HANDS,HIPS,KNEES  . Bronchitis, chronic/intermittent 01/22/2012  . Depression 01/22/2012  . GERD (gastroesophageal reflux disease)   . Hyperlipidemia   . IBS (irritable bowel syndrome) 01/22/2012  . Lupus (HCC)   . Neuromuscular disorder (HCC)   . Osteoporosis   . Seizures (HCC)   . SOB (shortness of breath)   . Thyroid disease    BP 128/83   Pulse 73   Temp 97.7 F (36.5 C)   Ht 5\' 7"  (1.702 m)   Wt 208 lb (94.3 kg)   SpO2 95%   BMI 32.58 kg/m   Opioid Risk Score:   Fall Risk Score:  `1  Depression screen PHQ 2/9  Depression screen [facility] 2/9 10/25/2019 07/22/2019 05/12/2019 03/17/2019 11/05/2018 08/13/2018 05/13/2018  Decreased Interest 0 0 1 0 1 0 1  Down, Depressed, Hopeless 0 0 1 0 0 0 0  PHQ - 2 Score 0 0 2 0 1 0 1  Altered sleeping - - - - - - -  Tired, decreased energy - - - - - - -  Change in appetite - - - - - - -  Feeling bad or failure about yourself  - - - - - - -  Trouble concentrating - - - - - - -  Moving slowly or fidgety/restless - - - - - - -  Suicidal thoughts - - - - - - -  PHQ-9 Score - - - - - - -  Some recent data might be hidden    Review of Systems  Constitutional: Negative.   HENT: Negative.   Eyes: Negative.   Respiratory: Negative.   Cardiovascular: Negative.   Gastrointestinal: Positive for constipation and diarrhea.  Endocrine: Negative.   Genitourinary: Positive for difficulty urinating.  Musculoskeletal: Negative.        Spasms   Skin: Negative.   Allergic/Immunologic: Negative.   Neurological: Positive for dizziness, tremors, weakness and numbness.       Tingling   Hematological: Negative.   Psychiatric/Behavioral: Negative.   All other systems reviewed and are negative.      Objective:   Physical Exam Vitals and nursing note reviewed.  Constitutional:      Appearance: Normal appearance.  Cardiovascular:     Rate  and Rhythm: Normal rate and regular rhythm.     Pulses: Normal pulses.     Heart sounds: Normal heart sounds.  Pulmonary:     Effort: Pulmonary effort is normal.     Breath sounds: Normal breath sounds.  Musculoskeletal:     Cervical back: Normal range of motion and neck supple.  Comments: Normal Muscle Bulk and Muscle Testing Reveals:  Upper Extremities: Full ROM and Muscle Strength 5/5 Thoracic Paraspinal Tenderness: T-7-T-9 T-11-T-12 Lower Extremities: Full ROM and Muscle Strength 5/5 Arises from Table with ease Narrow Based  Gait   Skin:    General: Skin is warm and dry.  Neurological:     Mental Status: She is alert and oriented to person, place, and time.  Psychiatric:        Mood and Affect: Mood normal.        Behavior: Behavior normal.           Assessment & Plan:  1.Chronic seizure disorder: No seizure's. Continuecurrent medication regimen withGabapentin. Neurology Following.01/10/2020. 2. Chronic muscle spasms, weakness with associated pain disorder: Continuecurrent medication regimen withBaclofen. Continue with Exercise regime.01/10/2020.. 3. Chronic dysphagia:No complaints today. Continue to monitor. GI Following.01/10/2020.. 4. Anxiety with depression : Stable. Continuecurrent medication regimen withElavil.01/10/2020. 5. Fibromyalgia/ Rib Pain/Chronic Pain:Continue Diclofenac: Continue with exercise and heat Therapy.01/10/2020.Marland Kitchen Refilled: oxyCODONE 7.5/325mg  onetablet 5 times daily as needed #150. Continue with slow weaning.Second script sent for the following month.We will continue opioid monitoring program, this consists of regular clinic visits, examinations, urine drug screen, pill counts as well as use of [place] Controlled Substance Reporting System. 6. Peripheral Neuropathy:Continuecurrent medication regimen withGabapentin:01/10/2020.  of face to face patient care time was spent during this visit. All questions  were encouraged and answered.  F/U in 1 month

## 2020-01-10 NOTE — Progress Notes (Deleted)
Subjective:    Patient ID: Autumn Davis, female    DOB: [birth year], 34 y.o.   MRN: [id number]  HPI   Pain Inventory Average Pain {NUMBERS; 0-10:5044} Pain Right Now {NUMBERS; 0-10:5044} My pain is {PAIN DESCRIPTION:21022940}  In the last 24 hours, has pain interfered with the following? General activity {NUMBERS; 0-10:5044} Relation with others {NUMBERS; 0-10:5044} Enjoyment of life {NUMBERS; 0-10:5044} What TIME of day is your pain at its worst? {TIME OF MVH:84696295} Sleep (in general) {BHH GOOD/FAIR/POOR:22877}  Pain is worse with: {ACTIVITIES:21022942} Pain improves with: {PAIN IMPROVES MWUX:32440102} Relief from Meds: {NUMBERS; 0-10:5044}  Mobility {MOBILITY VOZ:36644034}  Function {FUNCTION:21022946}  Neuro/Psych {NEURO/PSYCH:21022948}  Prior Studies {CPRM PRIOR STUDIES:21022953}  Physicians involved in your care {CPRM PHYSICIANS INVOLVED IN YOUR CARE:21022954}   Family History  Problem Relation Age of Onset  . Thyroid disease Mother   . Colon polyps Father   . Lung cancer Maternal Grandmother   . Cancer Paternal Grandmother        colon/pancreatiec/lymphoma  . Irritable bowel syndrome Brother   . AAA (abdominal aortic aneurysm) Neg Hx    Social History   Socioeconomic History  . Marital status: Married    Spouse name: Not on file  . Number of children: Not on file  . Years of education: Not on file  . Highest education level: Not on file  Occupational History  . Occupation: disabled    Employer: [employer]  Tobacco Use  . Smoking status: Never Smoker  . Smokeless tobacco: Never Used  Substance and Sexual Activity  . Alcohol use: No    Alcohol/week: 0.0 standard drinks  . Drug use: No  . Sexual activity: Not on file  Other Topics Concern  . Not on file  Social History Narrative  . Not on file   Social Determinants of Health   Financial Resource Strain:   . Difficulty of Paying Living Expenses: Not on file  Food  Insecurity:   . Worried About Programme researcher, broadcasting/film/video in the Last Year: Not on file  . Ran Out of Food in the Last Year: Not on file  Transportation Needs:   . Lack of Transportation (Medical): Not on file  . Lack of Transportation (Non-Medical): Not on file  Physical Activity:   . Days of Exercise per Week: Not on file  . Minutes of Exercise per Session: Not on file  Stress:   . Feeling of Stress : Not on file  Social Connections:   . Frequency of Communication with Friends and Family: Not on file  . Frequency of Social Gatherings with Friends and Family: Not on file  . Attends Religious Services: Not on file  . Active Member of Clubs or Organizations: Not on file  . Attends Banker Meetings: Not on file  . Marital Status: Not on file   Past Surgical History:  Procedure Laterality Date  . 24 HOUR PH STUDY N/A 10/29/2015   Procedure: 24 HOUR PH STUDY;  Surgeon: Iva Boop, MD;  Location: [facility];  Service: Endoscopy;  Laterality: N/A;  . COLONOSCOPY    . ESOPHAGEAL MANOMETRY N/A 10/29/2015   Procedure: ESOPHAGEAL MANOMETRY (EM);  Surgeon: Iva Boop, MD;  Location: [facility];  Service: Endoscopy;  Laterality: N/A;  . KNEE SURGERY  2002/2003   bil  . RECTAL SURGERY  correction of prolapse   2010   Past Medical History:  Diagnosis Date  . Allergy   . Arthritis    HANDS,HIPS,KNEES  .  Bronchitis, chronic/intermittent 01/22/2012  . Depression 01/22/2012  . GERD (gastroesophageal reflux disease)   . Hyperlipidemia   . IBS (irritable bowel syndrome) 01/22/2012  . Lupus (HCC)   . Neuromuscular disorder (HCC)   . Osteoporosis   . Seizures (HCC)   . SOB (shortness of breath)   . Thyroid disease    Temp 97.7 F (36.5 C)   Ht 5\' 7"  (1.702 m)   Wt 208 lb (94.3 kg)   BMI 32.58 kg/m   Opioid Risk Score:   Fall Risk Score:  `1  Depression screen PHQ 2/9  Depression screen [facility] 2/9 10/25/2019 07/22/2019 05/12/2019 03/17/2019 11/05/2018 08/13/2018 05/13/2018    Decreased Interest 0 0 1 0 1 0 1  Down, Depressed, Hopeless 0 0 1 0 0 0 0  PHQ - 2 Score 0 0 2 0 1 0 1  Altered sleeping - - - - - - -  Tired, decreased energy - - - - - - -  Change in appetite - - - - - - -  Feeling bad or failure about yourself  - - - - - - -  Trouble concentrating - - - - - - -  Moving slowly or fidgety/restless - - - - - - -  Suicidal thoughts - - - - - - -  PHQ-9 Score - - - - - - -  Some recent data might be hidden    Review of Systems     Objective:   Physical Exam        Assessment & Plan:

## 2020-01-10 NOTE — Progress Notes (Deleted)
Subjective:    Patient ID: Autumn Davis, female    DOB: Jun 29, 1986, 34 y.o.   MRN: [id number]  HPI    Review of Systems     Objective:   Physical Exam        Assessment & Plan:

## 2020-01-12 DIAGNOSIS — M9901 Segmental and somatic dysfunction of cervical region: Secondary | ICD-10-CM | POA: Diagnosis not present

## 2020-01-12 DIAGNOSIS — M9902 Segmental and somatic dysfunction of thoracic region: Secondary | ICD-10-CM | POA: Diagnosis not present

## 2020-01-12 DIAGNOSIS — M9903 Segmental and somatic dysfunction of lumbar region: Secondary | ICD-10-CM | POA: Diagnosis not present

## 2020-01-12 DIAGNOSIS — M545 Low back pain: Secondary | ICD-10-CM | POA: Diagnosis not present

## 2020-01-12 DIAGNOSIS — M542 Cervicalgia: Secondary | ICD-10-CM | POA: Diagnosis not present

## 2020-01-12 DIAGNOSIS — M546 Pain in thoracic spine: Secondary | ICD-10-CM | POA: Diagnosis not present

## 2020-01-17 ENCOUNTER — Telehealth: Payer: Self-pay | Admitting: *Deleted

## 2020-01-17 NOTE — Telephone Encounter (Signed)
NASL AER denied.  Please send in generic alternative   Fluticasone Propionate Flunisolied Triamcinolon Acetonide Budesonide Flonase Allergy Relief Nasacort Allergy 24HR

## 2020-01-17 NOTE — Telephone Encounter (Signed)
Please call patient and see if she can do any of alternatives

## 2020-01-17 NOTE — Telephone Encounter (Signed)
Aware of list for comparable medicines and she will check [facility] to discuss best option.

## 2020-01-19 ENCOUNTER — Other Ambulatory Visit: Payer: Self-pay

## 2020-01-19 DIAGNOSIS — M542 Cervicalgia: Secondary | ICD-10-CM | POA: Diagnosis not present

## 2020-01-19 DIAGNOSIS — M9903 Segmental and somatic dysfunction of lumbar region: Secondary | ICD-10-CM | POA: Diagnosis not present

## 2020-01-19 DIAGNOSIS — M9902 Segmental and somatic dysfunction of thoracic region: Secondary | ICD-10-CM | POA: Diagnosis not present

## 2020-01-19 DIAGNOSIS — M9901 Segmental and somatic dysfunction of cervical region: Secondary | ICD-10-CM | POA: Diagnosis not present

## 2020-01-19 DIAGNOSIS — M545 Low back pain: Secondary | ICD-10-CM | POA: Diagnosis not present

## 2020-01-19 DIAGNOSIS — M546 Pain in thoracic spine: Secondary | ICD-10-CM | POA: Diagnosis not present

## 2020-01-20 ENCOUNTER — Encounter: Payer: Self-pay | Admitting: Nurse Practitioner

## 2020-01-20 ENCOUNTER — Ambulatory Visit (INDEPENDENT_AMBULATORY_CARE_PROVIDER_SITE_OTHER): Payer: Medicare Other | Admitting: Nurse Practitioner

## 2020-01-20 VITALS — BP 142/96 | HR 86 | Temp 99.1°F | Resp 20 | Ht 67.0 in | Wt 206.0 lb

## 2020-01-20 DIAGNOSIS — K219 Gastro-esophageal reflux disease without esophagitis: Secondary | ICD-10-CM | POA: Diagnosis not present

## 2020-01-20 DIAGNOSIS — R569 Unspecified convulsions: Secondary | ICD-10-CM

## 2020-01-20 DIAGNOSIS — M797 Fibromyalgia: Secondary | ICD-10-CM

## 2020-01-20 DIAGNOSIS — R1310 Dysphagia, unspecified: Secondary | ICD-10-CM

## 2020-01-20 DIAGNOSIS — D8989 Other specified disorders involving the immune mechanism, not elsewhere classified: Secondary | ICD-10-CM | POA: Diagnosis not present

## 2020-01-20 DIAGNOSIS — Z3042 Encounter for surveillance of injectable contraceptive: Secondary | ICD-10-CM | POA: Diagnosis not present

## 2020-01-20 DIAGNOSIS — M329 Systemic lupus erythematosus, unspecified: Secondary | ICD-10-CM

## 2020-01-20 DIAGNOSIS — E559 Vitamin D deficiency, unspecified: Secondary | ICD-10-CM | POA: Diagnosis not present

## 2020-01-20 DIAGNOSIS — Z309 Encounter for contraceptive management, unspecified: Secondary | ICD-10-CM

## 2020-01-20 DIAGNOSIS — M545 Low back pain, unspecified: Secondary | ICD-10-CM

## 2020-01-20 DIAGNOSIS — E782 Mixed hyperlipidemia: Secondary | ICD-10-CM | POA: Diagnosis not present

## 2020-01-20 DIAGNOSIS — F3342 Major depressive disorder, recurrent, in full remission: Secondary | ICD-10-CM

## 2020-01-20 DIAGNOSIS — I479 Paroxysmal tachycardia, unspecified: Secondary | ICD-10-CM

## 2020-01-20 DIAGNOSIS — K581 Irritable bowel syndrome with constipation: Secondary | ICD-10-CM

## 2020-01-20 DIAGNOSIS — E079 Disorder of thyroid, unspecified: Secondary | ICD-10-CM | POA: Diagnosis not present

## 2020-01-20 MED ORDER — MEDROXYPROGESTERONE ACETATE 150 MG/ML IM SUSP
150.0000 mg | Freq: Once | INTRAMUSCULAR | Status: AC
  Administered 2020-01-20: 150 mg via INTRAMUSCULAR

## 2020-01-20 MED ORDER — VITAMIN D (ERGOCALCIFEROL) 1.25 MG (50000 UNIT) PO CAPS: ORAL_CAPSULE | ORAL | 5 refills | Status: DC

## 2020-01-20 NOTE — Progress Notes (Signed)
Subjective:    Patient ID: Autumn Davis, female    DOB: [birth year], 34 y.o.   MRN: [id number]   Chief Complaint: Medical Management of Chronic Issues    HPI:  1. Tachycardia, paroxysmal (HCC) No recent runs of tachycardia. She runs in the 90's ost of the time.  2. Dysphagia, unspecified type Has had n o issues as of late.  3. Gastroesophageal reflux disease without esophagitis Is on omeprazole 2 daily and that works well for him.  4. Thyroid disease No problems that she is aware. Lab Results  Component Value Date   TSH 0.783 10/25/2019     5. Irritable bowel syndrome with constipation Still has occasional constipation. Take colace daily which will sometimes send her in to diarrhea and she will need an imodium. Elavil helsp with spasms.  6. Back pain, lumbosacral Constant lower back pain- she goes to pain management every month.  7. Recurrent major depressive disorder, in full remission [facility]) Is currently  Not on an antidepressant. Says she is doing okay. Depression screen [facility] 2/9 01/20/2020 10/25/2019 07/22/2019  Decreased Interest 0 0 0  Down, Depressed, Hopeless 0 0 0  PHQ - 2 Score 0 0 0  Altered sleeping - - -  Tired, decreased energy - - -  Change in appetite - - -  Feeling bad or failure about yourself  - - -  Trouble concentrating - - -  Moving slowly or fidgety/restless - - -  Suicidal thoughts - - -  PHQ-9 Score - - -  Some recent data might be hidden     8. Mixed hyperlipidemia Is on daily dose of liptior and is doing well. Lab Results  Component Value Date   CHOL 186 10/25/2019   HDL 62 10/25/2019   LDLCALC 93 10/25/2019   TRIG 182 (H) 10/25/2019   CHOLHDL 3.0 10/25/2019     9. 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 Sees neruologist every 6 months.  11. Systemic lupus erythematosus, unspecified SLE type, unspecified organ involvement  status (HCC) Sometimes her test showed lupus and other times they didn't. So not sure she has lupus or not.  12. Seizures (HCC) Has had seizure activity. Is still on diamox. Is trying to get her licence back.  13. Vitamin D deficiency Takes daily vitamin d supplement.    Outpatient Encounter Medications as of 01/20/2020  Medication Sig  . acetaZOLAMIDE (DIAMOX) 250 MG tablet TAKE 1 TABLET EVERY MORNING, TAKE 1 TABLET AT 2PM, AND TAKE 2 TABLETSAT BEDTIME  . amitriptyline (ELAVIL) 75 MG tablet Take 1 tablet (75 mg total) by mouth at bedtime.  Marland Kitchen atorvastatin (LIPITOR) 40 MG tablet TAKE ONE (1) TABLET EACH DAY  . baclofen (LIORESAL) 20 MG tablet TAKE ONE TABLET FOUR TIMES DAILY  . Calcium-Magnesium-Vitamin D (CALCIUM 1200+D3 PO) Take 1 tablet by mouth daily.  . cetirizine (ZYRTEC) 10 MG tablet Take 10 mg by mouth daily.  . Cholecalciferol (VITAMIN D3) 50000 units CAPS Take 50,000 Units by mouth once a week.  . cyanocobalamin (,VITAMIN B-12,) 1000 MCG/ML injection Inject 1 mL (1,000 mcg total) into the muscle every 30 (thirty) days.  . Cyanocobalamin (B12 FAST DISSOLVE PO) Take by mouth 2 (two) times daily.  . cycloSPORINE (RESTASIS) 0.05 % ophthalmic emulsion Place 1 drop into both eyes 2 (two) times daily.  . diclofenac (VOLTAREN) 75 MG EC tablet TAKE ONE TABLET BY MOUTH TWICE DAILY  .  docusate sodium (COLACE) 100 MG capsule Take 100 mg by mouth 2 (two) times daily. PRN  . EPINEPHRINE 0.3 mg/0.3 mL IJ SOAJ injection USE AS DIRECTED  . furosemide (LASIX) 20 MG tablet Take 1 tablet (20 mg total) by mouth daily.  Marland Kitchen gabapentin (NEURONTIN) 600 MG tablet Take 1 tablet (600 mg total) by mouth 5 (five) times daily.  Marland Kitchen ipratropium-albuterol (DUONEB) 0.5-2.5 (3) MG/3ML SOLN Take 3 mLs by nebulization every 4 (four) hours as needed.  Marland Kitchen L-Methylfolate 15 MG TABS Take 1 tablet (15 mg total) by mouth daily.  . medroxyPROGESTERone (DEPO-PROVERA) 150 MG/ML injection Inject 150 mg into the muscle as  directed. Every 2 months  . metoprolol succinate (TOPROL-XL) 25 MG 24 hr tablet TAKE ONE (1) TABLET EACH DAY  . montelukast (SINGULAIR) 10 MG tablet TAKE ONE TABLET DAILY AT BEDTIME  . mupirocin ointment (BACTROBAN) 2 % Apply 1 application topically 2 (two) times daily.  Marland Kitchen nystatin (MYCOSTATIN) 100000 UNIT/ML suspension Use as directed 5 mLs (500,000 Units total) in the mouth or throat 4 (four) times daily.  Marland Kitchen nystatin-triamcinolone ointment (MYCOLOG) Apply 1 application topically 2 (two) times daily.  . Omega 3 1000 MG CAPS Take 1 capsule by mouth daily.  Marland Kitchen oxyCODONE-acetaminophen (PERCOCET) 7.5-325 MG tablet Take 1 tablet by mouth 5 (five) times daily as needed for moderate pain. No More Than 5 a day.  . predniSONE (DELTASONE) 5 MG tablet Take 1 tablet (5 mg total) by mouth every morning.  . promethazine (PHENERGAN) 12.5 MG tablet Take 1 tablet (12.5 mg total) by mouth every 8 (eight) hours as needed for nausea or vomiting.  . QNASL 80 MCG/ACT AERS USE 2 SPRAYS IN EACH NOSTRIL DAILY  . tamsulosin (FLOMAX) 0.4 MG CAPS capsule TAKE ONE (1) CAPSULE EACH DAY  . VENTOLIN HFA 108 (90 Base) MCG/ACT inhaler USE 2 PUFFS EVERY 4 TO 6 HOURS AS NEEDED  . vitamin E (VITAMIN E) 400 UNIT capsule Take 1,000 Units by mouth daily.   . budesonide-formoterol (SYMBICORT) 80-4.5 MCG/ACT inhaler Inhale 2 puffs into the lungs 2 (two) times daily for 30 days.     Past Surgical History:  Procedure Laterality Date  . 24 HOUR PH STUDY N/A 10/29/2015   Procedure: 24 HOUR PH STUDY;  Surgeon: Iva Boop, MD;  Location: [facility];  Service: Endoscopy;  Laterality: N/A;  . COLONOSCOPY    . ESOPHAGEAL MANOMETRY N/A 10/29/2015   Procedure: ESOPHAGEAL MANOMETRY (EM);  Surgeon: Iva Boop, MD;  Location[facility];  Service: Endoscopy;  Laterality: N/A;  . KNEE SURGERY  2002/2003   bil  . RECTAL SURGERY  correction of prolapse   2010    Family History  Problem Relation Age of Onset  . Thyroid disease  Mother   . Colon polyps Father   . Lung cancer Maternal Grandmother   . Cancer Paternal Grandmother        colon/pancreatiec/lymphoma  . Irritable bowel syndrome Brother   . AAA (abdominal aortic aneurysm) Neg Hx     New complaints: Non etoday  Social history: Lives with her parents and is raising her foster son.  Controlled substance contract: gets from pain management    Review of Systems  Constitutional: Negative for diaphoresis.  Eyes: Negative for pain.  Respiratory: Negative for shortness of breath.   Cardiovascular: Negative for chest pain, palpitations and leg swelling.  Gastrointestinal: Negative for abdominal pain.  Endocrine: Negative for polydipsia.  Skin: Negative for rash.  Neurological: Negative for dizziness, weakness  and headaches.  Hematological: Does not bruise/bleed easily.  All other systems reviewed and are negative.      Objective:   Physical Exam Vitals and nursing note reviewed.  Constitutional:      General: She is not in acute distress.    Appearance: Normal appearance. She is well-developed.  HENT:     Head: Normocephalic.     Nose: Nose normal.  Eyes:     Pupils: Pupils are equal, round, and reactive to light.  Neck:     Vascular: No carotid bruit or JVD.  Cardiovascular:     Rate and Rhythm: Normal rate and regular rhythm.     Heart sounds: Normal heart sounds.  Pulmonary:     Effort: Pulmonary effort is normal. No respiratory distress.     Breath sounds: Normal breath sounds. No wheezing or rales.  Chest:     Chest wall: No tenderness.  Abdominal:     General: Bowel sounds are normal. There is no distension or abdominal bruit.     Palpations: Abdomen is soft. There is no hepatomegaly, splenomegaly, mass or pulsatile mass.     Tenderness: There is no abdominal tenderness.  Musculoskeletal:        General: Normal range of motion.     Cervical back: Normal range of motion and neck supple.  Lymphadenopathy:     Cervical: No  cervical adenopathy.  Skin:    General: Skin is warm and dry.     Comments: No toenails on bil big toes.  Neurological:     Mental Status: She is alert and oriented to person, place, and time.     Deep Tendon Reflexes: Reflexes are normal and symmetric.  Psychiatric:        Behavior: Behavior normal.        Thought Content: Thought content normal.        Judgment: Judgment normal.     BP (!) 142/96   Pulse 86   Temp 99.1 F (37.3 C) (Temporal)   Resp 20   Ht 5\' 7"  (1.702 m)   Wt 206 lb (93.4 kg)   SpO2 98%   BMI 32.26 kg/m        Assessment & Plan:  Autumn Davis comes in today with chief complaint of Medical Management of Chronic Issues   Diagnosis and orders addressed:  1. Tachycardia, paroxysmal (HCC) Avoid caffeine  2. Dysphagia, unspecified type Chew food well  3. Gastroesophageal reflux disease without esophagitis Avoid spicy foods Do not eat 2 hours prior to bedtime continue omeprazole daily  4. Thyroid disease Labs pending - Thyroid Panel With TSH  5. Irritable bowel syndrome with constipation Colace and imodium as needed  6. Back pain, lumbosacral Continue pain managemnt Back stretches  7. Recurrent major depressive disorder, in full remission (HCC) Stress management  8. Mixed hyperlipidemia Low fat diet - CMP14+EGFR - Lipid panel  9. 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 Systemic lupus erythematosus, unspecified SLE type, unspecified organ involvement status (HCC)  12. Seizures (HCC) continue diamox  13. Vitamin D deficiency Continue weekly vitamin d - Vitamin D, Ergocalciferol, (DRISDOL) 1.25 MG (50000 UNIT) CAPS capsule; 1 po on Monday and thursday  Dispense: 10 capsule; Refill: 5 - VITAMIN D 25 Hydroxy (Vit-D Deficiency, Fractures)   Labs pending Health Maintenance reviewed Diet and exercise encouraged  Follow up plan: 3 months   Mary-Margaret Daphine Deutscher,  FNP

## 2020-01-20 NOTE — Addendum Note (Signed)
Addended by: Cleda Daub on: 01/20/2020 04:34 PM   Modules accepted: Orders

## 2020-01-21 LAB — LIPID PANEL
Chol/HDL Ratio: 2.7 ratio (ref 0.0–4.4)
Cholesterol, Total: 178 mg/dL (ref 100–199)
HDL: 66 mg/dL (ref >39)
LDL Chol Calc (NIH): 84 mg/dL (ref 0–99)
Triglycerides: 168 mg/dL — ABNORMAL HIGH (ref 0–149)
VLDL Cholesterol Cal: 28 mg/dL (ref 5–40)

## 2020-01-21 LAB — CMP14+EGFR
ALT: 26 IU/L (ref 0–32)
AST: 19 IU/L (ref 0–40)
Albumin/Globulin Ratio: 2 (ref 1.2–2.2)
Albumin: 4.5 g/dL (ref 3.8–4.8)
Alkaline Phosphatase: 82 IU/L (ref 39–117)
BUN/Creatinine Ratio: 10 (ref 9–23)
BUN: 8 mg/dL (ref 6–20)
Bilirubin Total: 0.3 mg/dL (ref 0.0–1.2)
CO2: 22 mmol/L (ref 20–29)
Calcium: 9.3 mg/dL (ref 8.7–10.2)
Chloride: 109 mmol/L — ABNORMAL HIGH (ref 96–106)
Creatinine, Ser: 0.81 mg/dL (ref 0.57–1.00)
GFR calc Af Amer: 108 mL/min/{1.73_m2} (ref >59)
GFR calc non Af Amer: 94 mL/min/{1.73_m2} (ref >59)
Globulin, Total: 2.2 g/dL (ref 1.5–4.5)
Glucose: 100 mg/dL — ABNORMAL HIGH (ref 65–99)
Potassium: 3.8 mmol/L (ref 3.5–5.2)
Sodium: 145 mmol/L — ABNORMAL HIGH (ref 134–144)
Total Protein: 6.7 g/dL (ref 6.0–8.5)

## 2020-01-21 LAB — THYROID PANEL WITH TSH
Free Thyroxine Index: 1.8 (ref 1.2–4.9)
T3 Uptake Ratio: 28 % (ref 24–39)
T4, Total: 6.5 ug/dL (ref 4.5–12.0)
TSH: 1.52 u[IU]/mL (ref 0.450–4.500)

## 2020-01-21 LAB — VITAMIN D 25 HYDROXY (VIT D DEFICIENCY, FRACTURES): Vit D, 25-Hydroxy: 35.5 ng/mL (ref 30.0–100.0)

## 2020-01-23 DIAGNOSIS — M9901 Segmental and somatic dysfunction of cervical region: Secondary | ICD-10-CM | POA: Diagnosis not present

## 2020-01-23 DIAGNOSIS — M9903 Segmental and somatic dysfunction of lumbar region: Secondary | ICD-10-CM | POA: Diagnosis not present

## 2020-01-23 DIAGNOSIS — M542 Cervicalgia: Secondary | ICD-10-CM | POA: Diagnosis not present

## 2020-01-23 DIAGNOSIS — M9902 Segmental and somatic dysfunction of thoracic region: Secondary | ICD-10-CM | POA: Diagnosis not present

## 2020-01-23 DIAGNOSIS — M546 Pain in thoracic spine: Secondary | ICD-10-CM | POA: Diagnosis not present

## 2020-01-23 DIAGNOSIS — M545 Low back pain: Secondary | ICD-10-CM | POA: Diagnosis not present

## 2020-01-25 DIAGNOSIS — M9902 Segmental and somatic dysfunction of thoracic region: Secondary | ICD-10-CM | POA: Diagnosis not present

## 2020-01-25 DIAGNOSIS — M546 Pain in thoracic spine: Secondary | ICD-10-CM | POA: Diagnosis not present

## 2020-01-25 DIAGNOSIS — M545 Low back pain: Secondary | ICD-10-CM | POA: Diagnosis not present

## 2020-01-25 DIAGNOSIS — M542 Cervicalgia: Secondary | ICD-10-CM | POA: Diagnosis not present

## 2020-01-25 DIAGNOSIS — M9901 Segmental and somatic dysfunction of cervical region: Secondary | ICD-10-CM | POA: Diagnosis not present

## 2020-01-25 DIAGNOSIS — M9903 Segmental and somatic dysfunction of lumbar region: Secondary | ICD-10-CM | POA: Diagnosis not present

## 2020-01-26 DIAGNOSIS — M9901 Segmental and somatic dysfunction of cervical region: Secondary | ICD-10-CM | POA: Diagnosis not present

## 2020-01-26 DIAGNOSIS — M542 Cervicalgia: Secondary | ICD-10-CM | POA: Diagnosis not present

## 2020-01-26 DIAGNOSIS — M546 Pain in thoracic spine: Secondary | ICD-10-CM | POA: Diagnosis not present

## 2020-01-26 DIAGNOSIS — M9902 Segmental and somatic dysfunction of thoracic region: Secondary | ICD-10-CM | POA: Diagnosis not present

## 2020-01-26 DIAGNOSIS — M9903 Segmental and somatic dysfunction of lumbar region: Secondary | ICD-10-CM | POA: Diagnosis not present

## 2020-01-26 DIAGNOSIS — M545 Low back pain: Secondary | ICD-10-CM | POA: Diagnosis not present

## 2020-01-30 DIAGNOSIS — M9902 Segmental and somatic dysfunction of thoracic region: Secondary | ICD-10-CM | POA: Diagnosis not present

## 2020-01-30 DIAGNOSIS — M546 Pain in thoracic spine: Secondary | ICD-10-CM | POA: Diagnosis not present

## 2020-01-30 DIAGNOSIS — M9901 Segmental and somatic dysfunction of cervical region: Secondary | ICD-10-CM | POA: Diagnosis not present

## 2020-01-30 DIAGNOSIS — M542 Cervicalgia: Secondary | ICD-10-CM | POA: Diagnosis not present

## 2020-01-30 DIAGNOSIS — M545 Low back pain: Secondary | ICD-10-CM | POA: Diagnosis not present

## 2020-01-30 DIAGNOSIS — M9903 Segmental and somatic dysfunction of lumbar region: Secondary | ICD-10-CM | POA: Diagnosis not present

## 2020-02-01 DIAGNOSIS — M9901 Segmental and somatic dysfunction of cervical region: Secondary | ICD-10-CM | POA: Diagnosis not present

## 2020-02-01 DIAGNOSIS — M546 Pain in thoracic spine: Secondary | ICD-10-CM | POA: Diagnosis not present

## 2020-02-01 DIAGNOSIS — M542 Cervicalgia: Secondary | ICD-10-CM | POA: Diagnosis not present

## 2020-02-01 DIAGNOSIS — M9903 Segmental and somatic dysfunction of lumbar region: Secondary | ICD-10-CM | POA: Diagnosis not present

## 2020-02-01 DIAGNOSIS — M545 Low back pain: Secondary | ICD-10-CM | POA: Diagnosis not present

## 2020-02-01 DIAGNOSIS — M9902 Segmental and somatic dysfunction of thoracic region: Secondary | ICD-10-CM | POA: Diagnosis not present

## 2020-02-03 ENCOUNTER — Other Ambulatory Visit: Payer: Self-pay

## 2020-02-03 ENCOUNTER — Encounter: Payer: Self-pay | Admitting: Nurse Practitioner

## 2020-02-03 ENCOUNTER — Ambulatory Visit (INDEPENDENT_AMBULATORY_CARE_PROVIDER_SITE_OTHER): Payer: Medicare Other | Admitting: Nurse Practitioner

## 2020-02-03 VITALS — BP 117/82 | HR 89 | Temp 99.3°F | Resp 20 | Ht 67.0 in | Wt 203.0 lb

## 2020-02-03 DIAGNOSIS — Z024 Encounter for examination for driving license: Secondary | ICD-10-CM | POA: Diagnosis not present

## 2020-02-03 NOTE — Progress Notes (Signed)
DMV papers filled out- see scanned in docuent

## 2020-02-06 ENCOUNTER — Other Ambulatory Visit: Payer: Self-pay | Admitting: Nurse Practitioner

## 2020-02-06 DIAGNOSIS — M9902 Segmental and somatic dysfunction of thoracic region: Secondary | ICD-10-CM | POA: Diagnosis not present

## 2020-02-06 DIAGNOSIS — M9903 Segmental and somatic dysfunction of lumbar region: Secondary | ICD-10-CM | POA: Diagnosis not present

## 2020-02-06 DIAGNOSIS — M545 Low back pain: Secondary | ICD-10-CM | POA: Diagnosis not present

## 2020-02-06 DIAGNOSIS — M9901 Segmental and somatic dysfunction of cervical region: Secondary | ICD-10-CM | POA: Diagnosis not present

## 2020-02-06 DIAGNOSIS — M542 Cervicalgia: Secondary | ICD-10-CM | POA: Diagnosis not present

## 2020-02-06 DIAGNOSIS — M546 Pain in thoracic spine: Secondary | ICD-10-CM | POA: Diagnosis not present

## 2020-02-10 ENCOUNTER — Other Ambulatory Visit: Payer: Self-pay | Admitting: Pulmonary Disease

## 2020-02-10 MED ORDER — BUDESONIDE-FORMOTEROL FUMARATE 80-4.5 MCG/ACT IN AERO: 2.0000 | INHALATION_SPRAY | Freq: Two times a day (BID) | RESPIRATORY_TRACT | 5 refills | Status: DC

## 2020-02-21 ENCOUNTER — Other Ambulatory Visit: Payer: Self-pay | Admitting: Nurse Practitioner

## 2020-02-21 DIAGNOSIS — I479 Paroxysmal tachycardia, unspecified: Secondary | ICD-10-CM

## 2020-02-28 DIAGNOSIS — F333 Major depressive disorder, recurrent, severe with psychotic symptoms: Secondary | ICD-10-CM | POA: Diagnosis not present

## 2020-03-09 ENCOUNTER — Encounter: Payer: Self-pay | Admitting: Registered Nurse

## 2020-03-09 ENCOUNTER — Encounter: Payer: Medicare Other | Attending: Registered Nurse | Admitting: Registered Nurse

## 2020-03-09 ENCOUNTER — Other Ambulatory Visit: Payer: Self-pay

## 2020-03-09 VITALS — BP 149/90 | HR 87 | Temp 98.9°F | Ht 67.0 in | Wt 208.6 lb

## 2020-03-09 DIAGNOSIS — G894 Chronic pain syndrome: Secondary | ICD-10-CM | POA: Diagnosis not present

## 2020-03-09 DIAGNOSIS — M797 Fibromyalgia: Secondary | ICD-10-CM | POA: Diagnosis not present

## 2020-03-09 DIAGNOSIS — R569 Unspecified convulsions: Secondary | ICD-10-CM | POA: Diagnosis not present

## 2020-03-09 DIAGNOSIS — M546 Pain in thoracic spine: Secondary | ICD-10-CM | POA: Diagnosis not present

## 2020-03-09 DIAGNOSIS — M6249 Contracture of muscle, multiple sites: Secondary | ICD-10-CM | POA: Diagnosis not present

## 2020-03-09 DIAGNOSIS — G609 Hereditary and idiopathic neuropathy, unspecified: Secondary | ICD-10-CM

## 2020-03-09 DIAGNOSIS — Z79891 Long term (current) use of opiate analgesic: Secondary | ICD-10-CM

## 2020-03-09 DIAGNOSIS — M62838 Other muscle spasm: Secondary | ICD-10-CM

## 2020-03-09 DIAGNOSIS — M545 Low back pain: Secondary | ICD-10-CM | POA: Diagnosis not present

## 2020-03-09 DIAGNOSIS — G8929 Other chronic pain: Secondary | ICD-10-CM | POA: Diagnosis not present

## 2020-03-09 DIAGNOSIS — Z5181 Encounter for therapeutic drug level monitoring: Secondary | ICD-10-CM

## 2020-03-09 DIAGNOSIS — M329 Systemic lupus erythematosus, unspecified: Secondary | ICD-10-CM | POA: Insufficient documentation

## 2020-03-09 MED ORDER — OXYCODONE-ACETAMINOPHEN 7.5-325 MG PO TABS: 1.0000 | ORAL_TABLET | Freq: Every day | ORAL | 0 refills | Status: DC | PRN

## 2020-03-09 NOTE — Progress Notes (Signed)
Subjective:    Patient ID: Autumn Davis, female    DOB: 1986-05-21, 34 y.o.   MRN: [id number]  HPI: Autumn Davis is a 34 y.o. female who returns for follow up appointment for chronic pain and medication refill. She states her pain is located in her bilateral hands with tingling, mid- back pain and bilateral feet with tingling. She rates her  Pain 6. Her current exercise regime is walking, doing a 30 minute workout video 5-6 times a week and performing stretching exercises.  [name] Morphine equivalent is 56.25  MME.    Last Oral Swab was Performed on 10/10/2019, it was   consistent.   Pain Inventory Average Pain 7 Pain Right Now 6 My pain is constant, sharp, burning, stabbing, tingling and aching  In the last 24 hours, has pain interfered with the following? General activity 7 Relation with others 7 Enjoyment of life 6 What TIME of day is your pain at its worst? evening and night Sleep (in general) Poor  Pain is worse with: walking, bending, sitting, inactivity, standing and some activites Pain improves with: rest, heat/ice, therapy/exercise, pacing activities, medication and injections Relief from Meds: 6  Mobility walk without assistance walk with assistance ability to climb steps?  yes do you drive?  no  Function disabled: date disabled 2012 I need assistance with the following:  meal prep, household duties and shopping  Neuro/Psych bladder control problems bowel control problems weakness numbness tremor tingling trouble walking spasms dizziness  Prior Studies Any changes since last visit?  no  Physicians involved in your care Any changes since last visit?  no   Family History  Problem Relation Age of Onset  . Thyroid disease Mother   . Colon polyps Father   . Lung cancer Maternal Grandmother   . Cancer Paternal Grandmother        colon/pancreatiec/lymphoma  . Irritable bowel syndrome Brother   . AAA (abdominal aortic aneurysm) Neg Hx    Social  History   Socioeconomic History  . Marital status: Married    Spouse name: Not on file  . Number of children: Not on file  . Years of education: Not on file  . Highest education level: Not on file  Occupational History  . Occupation: disabled    Employer: [employer]  Tobacco Use  . Smoking status: Never Smoker  . Smokeless tobacco: Never Used  Substance and Sexual Activity  . Alcohol use: No    Alcohol/week: 0.0 standard drinks  . Drug use: No  . Sexual activity: Not on file  Other Topics Concern  . Not on file  Social History Narrative  . Not on file   Social Determinants of Health   Financial Resource Strain:   . Difficulty of Paying Living Expenses:   Food Insecurity:   . Worried About Programme researcher, broadcasting/film/video in the Last Year:   . Barista in the Last Year:   Transportation Needs:   . Freight forwarder (Medical):   Marland Kitchen Lack of Transportation (Non-Medical):   Physical Activity:   . Days of Exercise per Week:   . Minutes of Exercise per Session:   Stress:   . Feeling of Stress :   Social Connections:   . Frequency of Communication with Friends and Family:   . Frequency of Social Gatherings with Friends and Family:   . Attends Religious Services:   . Active Member of Clubs or Organizations:   . Attends Banker  Meetings:   Marland Kitchen Marital Status:    Past Surgical History:  Procedure Laterality Date  . 24 HOUR PH STUDY N/A 10/29/2015   Procedure: 24 HOUR PH STUDY;  Surgeon: Iva Boop, MD;  Location: [facility];  Service: Endoscopy;  Laterality: N/A;  . COLONOSCOPY    . ESOPHAGEAL MANOMETRY N/A 10/29/2015   Procedure: ESOPHAGEAL MANOMETRY (EM);  Surgeon: Iva Boop, MD;  Location: [facility];  Service: Endoscopy;  Laterality: N/A;  . KNEE SURGERY  2002/2003   bil  . RECTAL SURGERY  correction of prolapse   2010   Past Medical History:  Diagnosis Date  . Allergy   . Arthritis    HANDS,HIPS,KNEES  . Bronchitis,  chronic/intermittent 01/22/2012  . Depression 01/22/2012  . GERD (gastroesophageal reflux disease)   . Hyperlipidemia   . IBS (irritable bowel syndrome) 01/22/2012  . Lupus (HCC)   . Neuromuscular disorder (HCC)   . Osteoporosis   . Seizures (HCC)   . SOB (shortness of breath)   . Thyroid disease    BP (!) 149/90   Pulse 87   Temp 98.9 F (37.2 C)   Ht 5\' 7"  (1.702 m)   Wt 208 lb 9.6 oz (94.6 kg)   SpO2 97%   BMI 32.67 kg/m   Opioid Risk Score:   Fall Risk Score:  `1  Depression screen PHQ 2/9  Depression screen [facility] 2/9 01/20/2020 10/25/2019 07/22/2019 05/12/2019 03/17/2019 11/05/2018 08/13/2018  Decreased Interest 0 0 0 1 0 1 0  Down, Depressed, Hopeless 0 0 0 1 0 0 0  PHQ - 2 Score 0 0 0 2 0 1 0  Altered sleeping - - - - - - -  Tired, decreased energy - - - - - - -  Change in appetite - - - - - - -  Feeling bad or failure about yourself  - - - - - - -  Trouble concentrating - - - - - - -  Moving slowly or fidgety/restless - - - - - - -  Suicidal thoughts - - - - - - -  PHQ-9 Score - - - - - - -  Some recent data might be hidden    Review of Systems  Neurological: Positive for dizziness, tremors, weakness and numbness.  All other systems reviewed and are negative.      Objective:   Physical Exam Vitals and nursing note reviewed.  Constitutional:      Appearance: Normal appearance.  Cardiovascular:     Rate and Rhythm: Normal rate and regular rhythm.     Pulses: Normal pulses.     Heart sounds: Normal heart sounds.  Pulmonary:     Effort: Pulmonary effort is normal.     Breath sounds: Normal breath sounds.  Musculoskeletal:     Cervical back: Normal range of motion and neck supple.     Comments: Normal Muscle Bulk and Muscle Testing Reveals:  Upper Extremities: Full ROM and Muscle Strength 5/5 Thoracic Paraspinal Tenderness: T-7-T-9  Lumbar Paraspinal Tenderness: L-4-L-5 Lower Extremities: Full ROM and Muscle Strength 5/5 Arises from Table with Ease  Narrow Based  Gait   Skin:    General: Skin is warm and dry.  Neurological:     Mental Status: She is alert and oriented to person, place, and time.  Psychiatric:        Mood and Affect: Mood normal.        Behavior: Behavior normal.  Assessment & Plan:  1.Chronic seizure disorder: No seizure's. Continuecurrent medication regimen withGabapentin. Neurology Following.03/09/2020. 2. Chronic muscle spasms, weakness with associated pain disorder: Continuecurrent medication regimen withBaclofen. Continue with Exercise regime.03/09/2020. 3. Chronic dysphagia:No complaints today. Continue to monitor.GI Following.0423/2021. 4. Anxiety with depression : Stable. Continuecurrent medication regimen withElavil.03/09/2020. 5. Fibromyalgia/ Rib Pain/Chronic Pain:Continue Diclofenac: Continue with exercise and heat Therapy.03/10/2019. Refilled: oxyCODONE 7.5/325mg  onetablet 5 times daily as needed #150. Second script sent for the following month. Continue with slow weaning.We will continue opioid monitoring program, this consists of regular clinic visits, examinations, urine drug screen, pill counts as well as use of [place] Controlled Substance Reporting System. 6. Peripheral Neuropathy:Continuecurrent medication regimen withGabapentin:03/09/2020.  of face to face patient care time was spent during this visit. All questions were encouraged and answered.  F/U in 1 month

## 2020-05-02 ENCOUNTER — Other Ambulatory Visit: Payer: Self-pay | Admitting: Nurse Practitioner

## 2020-05-11 ENCOUNTER — Encounter: Payer: Self-pay | Admitting: Registered Nurse

## 2020-05-11 ENCOUNTER — Encounter: Payer: Medicare Other | Attending: Registered Nurse | Admitting: Registered Nurse

## 2020-05-11 ENCOUNTER — Encounter: Payer: Self-pay | Admitting: Nurse Practitioner

## 2020-05-11 ENCOUNTER — Other Ambulatory Visit: Payer: Self-pay

## 2020-05-11 ENCOUNTER — Ambulatory Visit (INDEPENDENT_AMBULATORY_CARE_PROVIDER_SITE_OTHER): Payer: Medicare Other | Admitting: Nurse Practitioner

## 2020-05-11 VITALS — BP 102/78 | HR 81 | Temp 97.9°F | Ht 67.5 in | Wt 209.4 lb

## 2020-05-11 VITALS — BP 144/93 | HR 107 | Temp 98.4°F | Resp 20 | Ht 67.0 in | Wt 209.0 lb

## 2020-05-11 DIAGNOSIS — M6249 Contracture of muscle, multiple sites: Secondary | ICD-10-CM | POA: Diagnosis present

## 2020-05-11 DIAGNOSIS — K581 Irritable bowel syndrome with constipation: Secondary | ICD-10-CM

## 2020-05-11 DIAGNOSIS — M546 Pain in thoracic spine: Secondary | ICD-10-CM

## 2020-05-11 DIAGNOSIS — M797 Fibromyalgia: Secondary | ICD-10-CM | POA: Diagnosis present

## 2020-05-11 DIAGNOSIS — R0600 Dyspnea, unspecified: Secondary | ICD-10-CM

## 2020-05-11 DIAGNOSIS — R350 Frequency of micturition: Secondary | ICD-10-CM

## 2020-05-11 DIAGNOSIS — M3219 Other organ or system involvement in systemic lupus erythematosus: Secondary | ICD-10-CM

## 2020-05-11 DIAGNOSIS — M62838 Other muscle spasm: Secondary | ICD-10-CM | POA: Diagnosis present

## 2020-05-11 DIAGNOSIS — R1310 Dysphagia, unspecified: Secondary | ICD-10-CM

## 2020-05-11 DIAGNOSIS — R0609 Other forms of dyspnea: Secondary | ICD-10-CM

## 2020-05-11 DIAGNOSIS — M545 Low back pain, unspecified: Secondary | ICD-10-CM

## 2020-05-11 DIAGNOSIS — M329 Systemic lupus erythematosus, unspecified: Secondary | ICD-10-CM | POA: Diagnosis present

## 2020-05-11 DIAGNOSIS — J42 Unspecified chronic bronchitis: Secondary | ICD-10-CM | POA: Diagnosis not present

## 2020-05-11 DIAGNOSIS — F3342 Major depressive disorder, recurrent, in full remission: Secondary | ICD-10-CM

## 2020-05-11 DIAGNOSIS — E079 Disorder of thyroid, unspecified: Secondary | ICD-10-CM

## 2020-05-11 DIAGNOSIS — Z79891 Long term (current) use of opiate analgesic: Secondary | ICD-10-CM

## 2020-05-11 DIAGNOSIS — G894 Chronic pain syndrome: Secondary | ICD-10-CM | POA: Diagnosis present

## 2020-05-11 DIAGNOSIS — K219 Gastro-esophageal reflux disease without esophagitis: Secondary | ICD-10-CM

## 2020-05-11 DIAGNOSIS — M7061 Trochanteric bursitis, right hip: Secondary | ICD-10-CM

## 2020-05-11 DIAGNOSIS — M818 Other osteoporosis without current pathological fracture: Secondary | ICD-10-CM

## 2020-05-11 DIAGNOSIS — D8989 Other specified disorders involving the immune mechanism, not elsewhere classified: Secondary | ICD-10-CM

## 2020-05-11 DIAGNOSIS — Z5181 Encounter for therapeutic drug level monitoring: Secondary | ICD-10-CM | POA: Diagnosis present

## 2020-05-11 DIAGNOSIS — R569 Unspecified convulsions: Secondary | ICD-10-CM | POA: Diagnosis present

## 2020-05-11 DIAGNOSIS — G609 Hereditary and idiopathic neuropathy, unspecified: Secondary | ICD-10-CM

## 2020-05-11 DIAGNOSIS — I479 Paroxysmal tachycardia, unspecified: Secondary | ICD-10-CM

## 2020-05-11 DIAGNOSIS — M7062 Trochanteric bursitis, left hip: Secondary | ICD-10-CM

## 2020-05-11 DIAGNOSIS — E559 Vitamin D deficiency, unspecified: Secondary | ICD-10-CM

## 2020-05-11 DIAGNOSIS — R609 Edema, unspecified: Secondary | ICD-10-CM

## 2020-05-11 DIAGNOSIS — G8929 Other chronic pain: Secondary | ICD-10-CM

## 2020-05-11 DIAGNOSIS — E782 Mixed hyperlipidemia: Secondary | ICD-10-CM

## 2020-05-11 MED ORDER — TAMSULOSIN HCL 0.4 MG PO CAPS: ORAL_CAPSULE | ORAL | 1 refills | Status: DC

## 2020-05-11 MED ORDER — METOPROLOL SUCCINATE ER 25 MG PO TB24: 25.0000 mg | ORAL_TABLET | Freq: Every day | ORAL | 1 refills | Status: DC

## 2020-05-11 MED ORDER — PREDNISONE 5 MG PO TABS: 5.0000 mg | ORAL_TABLET | Freq: Every morning | ORAL | 1 refills | Status: DC

## 2020-05-11 MED ORDER — ATORVASTATIN CALCIUM 40 MG PO TABS: ORAL_TABLET | ORAL | 1 refills | Status: DC

## 2020-05-11 MED ORDER — FUROSEMIDE 20 MG PO TABS: 20.0000 mg | ORAL_TABLET | Freq: Every day | ORAL | 1 refills | Status: DC

## 2020-05-11 MED ORDER — OXYCODONE-ACETAMINOPHEN 7.5-325 MG PO TABS: 1.0000 | ORAL_TABLET | Freq: Every day | ORAL | 0 refills | Status: DC | PRN

## 2020-05-11 MED ORDER — BACLOFEN 20 MG PO TABS: ORAL_TABLET | ORAL | 1 refills | Status: DC

## 2020-05-11 MED ORDER — ACETAZOLAMIDE 250 MG PO TABS: ORAL_TABLET | ORAL | 1 refills | Status: DC

## 2020-05-11 NOTE — Progress Notes (Signed)
Subjective:    Patient ID: Autumn Davis, female    DOB: 1985/12/22, 34 y.o.   MRN: [id number]   Chief Complaint: Medical Management of Chronic Issues    HPI:  1. Tachycardia, paroxysmal (HCC) Does have episodes often, typically with exertion.  Pulse Readings from Last 3 Encounters:  05/11/20 81  03/09/20 87  02/03/20 89     2. Chronic bronchitis, unspecified chronic bronchitis type (HCC) Since Symbicort has been initiated, she has not had to use her rescue inhaler for episodes of dyspnea.   3. Gastroesophageal reflux disease without esophagitis Avoids spicy and acidic foods. Stays sitting upright for 30 minutes or more after a meal.   4. Irritable bowel syndrome with constipation Is not having more diarrhea. Medication changes has led to this change, the patient believes.   5. Dysphagia, unspecified type Since staying more upright, does not have episodes. Thoroughly chews food to avoid choking.   6. Thyroid disease Does not feel fatigued and does not have to take her medication since 7 years ago because the thyroid levels have been within normal range.   Lab Results  Component Value Date   TSH 1.520 01/20/2020     7. Other osteoporosis without current pathological fracture Does have bone pain, unsure if it is related.   8. Seizures (HCC) Last seizure was 3.5 years ago. Has not had another episode since. It is controlled with her medication and she does take them regularly.   9. Recurrent major depressive disorder, in full remission (HCC) No episodes for depression. It is controlled with medication she also takes for sleep and pain.   10. Back pain, lumbosacral Back pain in the rib area. Feels pain circumferentially, states it worsens with exertion and heat helps.    11. Mixed hyperlipidemia Low-fat diet and exercise as tolerated is being performed.  Lab Results  Component Value Date   CHOL 178 01/20/2020   HDL 66 01/20/2020   LDLCALC 84 01/20/2020   TRIG 168  (H) 01/20/2020   CHOLHDL 2.7 01/20/2020     12. Muscle spasticity Does a significant amount of stretching and takes muscle relaxants.   13. Systemic lupus erythematosus, unspecified SLE type, unspecified organ involvement status (HCC) Does maintain blood work appointments.   14. 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 Vitamin D deficiency Takes a dailly vitamin d deficiency Last vitamin D Lab Results  Component Value Date   VD25OH 35.5 01/20/2020     17. Dyspnea on exertion Only gets sob when she over exerts herself    Outpatient Encounter Medications as of 05/11/2020  Medication Sig   acetaZOLAMIDE (DIAMOX) 250 MG tablet TAKE 1 TABLET EVERY MORNING, TAKE 1 TABLET AT 2PM, AND TAKE 2 TABLETSAT BEDTIME   amitriptyline (ELAVIL) 75 MG tablet Take 1 tablet (75 mg total) by mouth at bedtime.   atorvastatin (LIPITOR) 40 MG tablet TAKE ONE (1) TABLET EACH DAY   baclofen (LIORESAL) 20 MG tablet TAKE ONE TABLET FOUR TIMES DAILY   budesonide-formoterol (SYMBICORT) 80-4.5 MCG/ACT inhaler Inhale 2 puffs into the lungs 2 (two) times daily.   Calcium-Magnesium-Vitamin D (CALCIUM 1200+D3 PO) Take 1 tablet by mouth daily.   cetirizine (ZYRTEC) 10 MG tablet Take 10 mg by mouth daily.   Cholecalciferol (VITAMIN D3) 50000 units CAPS Take 50,000 Units by mouth once a week.   cyanocobalamin (,VITAMIN B-12,) 1000 MCG/ML injection Inject 1 mL (1,000 mcg total) into the muscle every 30 (  thirty) days.   Cyanocobalamin (B12 FAST DISSOLVE PO) Take by mouth 2 (two) times daily.   cycloSPORINE (RESTASIS) 0.05 % ophthalmic emulsion Place 1 drop into both eyes 2 (two) times daily.   diclofenac (VOLTAREN) 75 MG EC tablet TAKE ONE TABLET BY MOUTH TWICE DAILY   docusate sodium (COLACE) 100 MG capsule Take 100 mg by mouth 2 (two) times daily. PRN   EPINEPHRINE 0.3 mg/0.3 mL IJ SOAJ injection USE AS DIRECTED   furosemide (LASIX) 20  MG tablet Take 1 tablet (20 mg total) by mouth daily.   gabapentin (NEURONTIN) 600 MG tablet Take 1 tablet (600 mg total) by mouth 5 (five) times daily.   ipratropium-albuterol (DUONEB) 0.5-2.5 (3) MG/3ML SOLN Take 3 mLs by nebulization every 4 (four) hours as needed.   L-Methylfolate 15 MG TABS Take 1 tablet (15 mg total) by mouth daily.   medroxyPROGESTERone (DEPO-PROVERA) 150 MG/ML injection Inject 150 mg into the muscle as directed. Every 2 months   metoprolol succinate (TOPROL-XL) 25 MG 24 hr tablet TAKE ONE (1) TABLET EACH DAY   montelukast (SINGULAIR) 10 MG tablet TAKE ONE TABLET DAILY AT BEDTIME   mupirocin ointment (BACTROBAN) 2 % Apply 1 application topically 2 (two) times daily.   nystatin (MYCOSTATIN) 100000 UNIT/ML suspension Use as directed 5 mLs (500,000 Units total) in the mouth or throat 4 (four) times daily.   nystatin-triamcinolone ointment (MYCOLOG) Apply 1 application topically 2 (two) times daily.   Omega 3 1000 MG CAPS Take 1 capsule by mouth daily.   oxyCODONE-acetaminophen (PERCOCET) 7.5-325 MG tablet Take 1 tablet by mouth 5 (five) times daily as needed for moderate pain. No More Than 5 a day.   predniSONE (DELTASONE) 5 MG tablet Take 1 tablet (5 mg total) by mouth every morning.   promethazine (PHENERGAN) 12.5 MG tablet Take 1 tablet (12.5 mg total) by mouth every 8 (eight) hours as needed for nausea or vomiting.   QNASL 80 MCG/ACT AERS USE 2 SPRAYS IN EACH NOSTRIL DAILY   tamsulosin (FLOMAX) 0.4 MG CAPS capsule TAKE ONE (1) CAPSULE EACH DAY   VENTOLIN HFA 108 (90 Base) MCG/ACT inhaler USE 2 PUFFS EVERY 4 TO 6 HOURS AS NEEDED   Vitamin D, Ergocalciferol, (DRISDOL) 1.25 MG (50000 UNIT) CAPS capsule 1 po on Monday and thursday   vitamin E (VITAMIN E) 400 UNIT capsule Take 1,000 Units by mouth daily.    No facility-administered encounter medications on file as of 05/11/2020.    Past Surgical History:  Procedure Laterality Date   16 HOUR PH STUDY N/A  10/29/2015   Procedure: 24 HOUR PH STUDY;  Surgeon: Iva Boop, MD;  Location: [facility];  Service: Endoscopy;  Laterality: N/A;   COLONOSCOPY     ESOPHAGEAL MANOMETRY N/A 10/29/2015   Procedure: ESOPHAGEAL MANOMETRY (EM);  Surgeon: Iva Boop, MD;  Location: [facility];  Service: Endoscopy;  Laterality: N/A;   KNEE SURGERY  2002/2003   bil   RECTAL SURGERY  correction of prolapse   2010    Family History  Problem Relation Age of Onset   Thyroid disease Mother    Colon polyps Father    Lung cancer Maternal Grandmother    Cancer Paternal Grandmother        colon/pancreatiec/lymphoma   Irritable bowel syndrome Brother    AAA (abdominal aortic aneurysm) Neg Hx     New complaints: None today  Social history: Lives with her parents  Controlled substance contract: n/a     Review of  Systems  Constitutional: Positive for fatigue. Negative for diaphoresis.  Eyes: Negative for pain.  Respiratory: Negative for shortness of breath.   Cardiovascular: Negative for chest pain, palpitations and leg swelling.  Gastrointestinal: Negative for abdominal pain.  Endocrine: Negative for polydipsia.  Musculoskeletal: Positive for arthralgias and back pain.  Skin: Negative for rash.  Neurological: Positive for headaches. Negative for dizziness and weakness.  Hematological: Does not bruise/bleed easily.  Psychiatric/Behavioral: The patient is not nervous/anxious.   All other systems reviewed and are negative.      Objective:   Physical Exam Vitals and nursing note reviewed.  Constitutional:      General: She is not in acute distress.    Appearance: Normal appearance. She is well-developed.  HENT:     Head: Normocephalic.     Nose: Nose normal.  Eyes:     Pupils: Pupils are equal, round, and reactive to light.  Neck:     Vascular: No carotid bruit or JVD.  Cardiovascular:     Rate and Rhythm: Normal rate and regular rhythm.     Heart sounds: Normal heart  sounds.  Pulmonary:     Effort: Pulmonary effort is normal. No respiratory distress.     Breath sounds: Normal breath sounds. No wheezing or rales.  Chest:     Chest wall: No tenderness.  Abdominal:     General: Bowel sounds are normal. There is no distension or abdominal bruit.     Palpations: Abdomen is soft. There is no hepatomegaly, splenomegaly, mass or pulsatile mass.     Tenderness: There is no abdominal tenderness.  Musculoskeletal:        General: Normal range of motion.     Cervical back: Normal range of motion and neck supple.     Comments: Rises slowly from sitting to standing  Lymphadenopathy:     Cervical: No cervical adenopathy.  Skin:    General: Skin is warm and dry.  Neurological:     Mental Status: She is alert and oriented to person, place, and time.     Deep Tendon Reflexes: Reflexes are normal and symmetric.  Psychiatric:        Behavior: Behavior normal.        Thought Content: Thought content normal.        Judgment: Judgment normal.    BP (!) 144/93    Pulse (!) 107    Temp 98.4 F (36.9 C) (Temporal)    Resp 20    Ht 5\' 7"  (1.702 m)    Wt 209 lb (94.8 kg)    SpO2 96%    BMI 32.73 kg/m         Assessment & Plan:  Autumn Davis comes in today with chief complaint of Medical Management of Chronic Issues   Diagnosis and orders addressed:  1. Tachycardia, paroxysmal [facility]) Patient encouraged to check her pulse daily and note her actions when she is tachycardic. Patient instructed to avoid vigorous exercise.   2. Chronic bronchitis, unspecified chronic bronchitis type (HCC) Patient instructed to cough often and turn in order to prevent pneumonia. Patient instructed to maintain set treatment regimen.  3. Gastroesophageal reflux disease without esophagitis Patient instructed to avoid spicy foods and acidic foods. Encouraged to avoid laying down for at least 30 minutes after a meal.  4. Irritable bowel syndrome with constipation Patient instructed to  avoid trigger foods and excess stress or anxiety. Encouraged to maintain current set treatment regimen.  5. Dysphagia, unspecified type Patient encouraged to  chew food thoroughly and avoid taking large bites of food.   6. Thyroid disease Patient encouraged to maintain medication regimen and encouraged to take thyroid medication at the same time each day. Labs pending.  7. Other osteoporosis without current pathological fracture Patient instructed to avoid strenuous activity. Patient instructed to exercise lightly as tolerated.   8. Seizures (HCC) Patient instructed to maintain her treatment regimen and avoid stressful triggers of seizures. Encouraged to discuss seizure disorder with close friends and family so they can be prepared if she has a seizure in front of them.  9. Recurrent major depressive disorder, in full remission [facility]) Patient encouraged to journal her episodes of depression.   10. Back pain, lumbosacral Patient instructed to avoid strenuous activity and heavy lifting. Patient instructed to always utilize proper lifting techniques. Follow-up with the pain clinic.  11. Mixed hyperlipidemia Patient encouraged to follow a low-fat diet and exercise as tolerated.   12. Muscle spasticity Patient encouraged to stretch as tolerated.   13. Systemic lupus erythematosus, unspecified SLE type, unspecified organ involvement status [facility]) Patient encouraged to maintain follow-up appointments and lab work as recommended.    14. Fibromyalgia Patient encouraged to exercise as tolerated and avoid severely cold or hot environments.   15. Inflammatory autoimmune disorder [facility]) Patient encouraged to avoid triggers and maintain current treatment regimen.  16. Vitamin D deficiency Patient encouraged to take supplements and maintain follow-up appointments for lab work as instructed.  17. Dyspnea on exertion Patient encouraged to avoid strenuous exercise.  Meds ordered this encounter    Medications   baclofen (LIORESAL) 20 MG tablet    Sig: TAKE ONE TABLET FOUR TIMES DAILY    Dispense:  360 each    Refill:  1    Order Specific Question:   Supervising Provider    Answer:   Arville Care A [[id number]]   atorvastatin (LIPITOR) 40 MG tablet    Sig: TAKE ONE (1) TABLET EACH DAY    Dispense:  90 tablet    Refill:  1    Order Specific Question:   Supervising Provider    Answer:   Arville Care A [[id number]]   furosemide (LASIX) 20 MG tablet    Sig: Take 1 tablet (20 mg total) by mouth daily.    Dispense:  90 tablet    Refill:  1    Order Specific Question:   Supervising Provider    Answer:   Arville Care A [[id number]]   acetaZOLAMIDE (DIAMOX) 250 MG tablet    Sig: TAKE 1 TABLET EVERY MORNING, TAKE 1 TABLET AT 2PM, AND TAKE 2 TABLETSAT BEDTIME    Dispense:  360 tablet    Refill:  1    Order Specific Question:   Supervising Provider    Answer:   Arville Care A [[id number]]   predniSONE (DELTASONE) 5 MG tablet    Sig: Take 1 tablet (5 mg total) by mouth every morning.    Dispense:  90 tablet    Refill:  1    Order Specific Question:   Supervising Provider    Answer:   Arville Care A [[id number]]   metoprolol succinate (TOPROL-XL) 25 MG 24 hr tablet    Sig: Take 1 tablet (25 mg total) by mouth daily.    Dispense:  90 tablet    Refill:  1    Order Specific Question:   Supervising Provider    Answer:   Arville Care A [[id number]]   tamsulosin (FLOMAX) 0.4 MG CAPS capsule  Sig: TAKE ONE (1) CAPSULE EACH DAY    Dispense:  90 capsule    Refill:  1    Order Specific Question:   Supervising Provider    Answer:   Arville Care A [[id number]]   Orders Placed This Encounter  Procedures   CBC with Differential/Platelet   CMP14+EGFR   Lipid panel   Thyroid Panel With TSH     Labs pending Health Maintenance reviewed Diet and exercise encouraged  Follow up plan: Follow-up in 3 months.  Rebecca L. Squier, BSN, RN Mary-Margaret Daphine Deutscher,  FNP

## 2020-05-11 NOTE — Patient Instructions (Signed)
Stress, Adult °Stress is a normal reaction to life events. Stress is what you feel when life demands more than you are used to, or more than you think you can handle. Some stress can be useful, such as studying for a test or meeting a deadline at work. Stress that occurs too often or for too long can cause problems. It can affect your emotional health and interfere with relationships and normal daily activities. Too much stress can weaken your body's defense system (immune system) and increase your risk for physical illness. If you already have a medical problem, stress can make it worse. °What are the causes? °All sorts of life events can cause stress. An event that causes stress for one person may not be stressful for another person. Major life events, whether positive or negative, commonly cause stress. Examples include: °· Losing a job or starting a new job. °· Losing a loved one. °· Moving to a new town or home. °· Getting married or divorced. °· Having a baby. °· Getting injured or sick. °Less obvious life events can also cause stress, especially if they occur day after day or in combination with each other. Examples include: °· Working long hours. °· Driving in traffic. °· Caring for children. °· Being in debt. °· Being in a difficult relationship. °What are the signs or symptoms? °Stress can cause emotional symptoms, including: °· Anxiety. This is feeling worried, afraid, on edge, overwhelmed, or out of control. °· Anger, including irritation or impatience. °· Depression. This is feeling sad, down, helpless, or guilty. °· Trouble focusing, remembering, or making decisions. °Stress can cause physical symptoms, including: °· Aches and pains. These may affect your head, neck, back, stomach, or other areas of your body. °· Tight muscles or a clenched jaw. °· Low energy. °· Trouble sleeping. °Stress can cause unhealthy behaviors, including: °· Eating to feel better (overeating) or skipping meals. °· Working too  much or putting off tasks. °· Smoking, drinking alcohol, or using drugs to feel better. °How is this diagnosed? °Stress is diagnosed through an assessment by your health care provider. He or she may diagnose this condition based on: °· Your symptoms and any stressful life events. °· Your medical history. °· Tests to rule out other causes of your symptoms. °Depending on your condition, your health care provider may refer you to a specialist for further evaluation. °How is this treated? ° °Stress management techniques are the recommended treatment for stress. Medicine is not typically recommended for the treatment of stress. °Techniques to reduce your reaction to stressful life events include: °· Stress identification. Monitor yourself for symptoms of stress and identify what causes stress for you. These skills may help you to avoid or prepare for stressful events. °· Time management. Set your priorities, keep a calendar of events, and learn to say no. Taking these actions can help you avoid making too many commitments. °Techniques for coping with stress include: °· Rethinking the problem. Try to think realistically about stressful events rather than ignoring them or overreacting. Try to find the positives in a stressful situation rather than focusing on the negatives. °· Exercise. Physical exercise can release both physical and emotional tension. The key is to find a form of exercise that you enjoy and do it regularly. °· Relaxation techniques. These relax the body and mind. The key is to find one or more that you enjoy and use the techniques regularly. Examples include: °? Meditation, deep breathing, or progressive relaxation techniques. °? Yoga or   tai chi. ? Biofeedback, mindfulness techniques, or journaling. ? Listening to music, being out in nature, or participating in other hobbies.  Practicing a healthy lifestyle. Eat a balanced diet, drink plenty of water, limit or avoid caffeine, and get plenty of  sleep.  Having a strong support network. Spend time with family, friends, or other people you enjoy being around. Express your feelings and talk things over with someone you trust. Counseling or talk therapy with a mental health professional may be helpful if you are having trouble managing stress on your own. Follow these instructions at home: Lifestyle   Avoid drugs.  Do not use any products that contain nicotine or tobacco, such as cigarettes, e-cigarettes, and chewing tobacco. If you need help quitting, ask your health care provider.  Limit alcohol intake to no more than 1 drink a day for nonpregnant women and 2 drinks a day for men. One drink equals 12 oz of beer, 5 oz of wine, or 1 oz of hard liquor  Do not use alcohol or drugs to relax.  Eat a balanced diet that includes fresh fruits and vegetables, whole grains, lean meats, fish, eggs, and beans, and low-fat dairy. Avoid processed foods and foods high in added fat, sugar, and salt.  Exercise at least 30 minutes on 5 or more days each week.  Get 7-8 hours of sleep each night. General instructions   Practice stress management techniques as discussed with your health care provider.  Drink enough fluid to keep your urine clear or pale yellow.  Take over-the-counter and prescription medicines only as told by your health care provider.  Keep all follow-up visits as told by your health care provider. This is important. Contact a health care provider if:  Your symptoms get worse.  You have new symptoms.  You feel overwhelmed by your problems and can no longer manage them on your own. Get help right away if:  You have thoughts of hurting yourself or others. If you ever feel like you may hurt yourself or others, or have thoughts about taking your own life, get help right away. You can go to your nearest emergency department or call:  Your local emergency services (911 in the [place]  A suicide crisis helpline, such as the  [facility] at 507-293-2218. This is open 24 hours a day. Summary  Stress is a normal reaction to life events. It can cause problems if it happens too often or for too long.  Practicing stress management techniques is the best way to treat stress.  Counseling or talk therapy with a mental health professional may be helpful if you are having trouble managing stress on your own. This information is not intended to replace advice given to you by your health care provider. Make sure you discuss any questions you have with your health care provider. Document Revised: 06/03/2019 Document Reviewed: 12/24/2016 Elsevier Patient Education  [place].

## 2020-05-11 NOTE — Progress Notes (Signed)
Subjective:    Patient ID: Autumn Davis, female    DOB: 08-19-1986, 34 y.o.   MRN: [id number]  HPI: Autumn Davis is a 34 y.o. female who returns for follow up appointment for chronic pain and medication refill. She states her pain is located in her mid-lower back pain and bilateral hip pain. She rates her pain 4. Her current exercise regime is walking, performing stretching exercises and video exercises daily. .  [name] Morphine equivalent is 56.25  MME.  Oral Swab was Performed Today.   Pain Inventory Average Pain 6 Pain Right Now 4 My pain is intermittent, constant, sharp, burning, stabbing and tingling  In the last 24 hours, has pain interfered with the following? General activity 6 Relation with others 6 Enjoyment of life 6 What TIME of day is your pain at its worst? night and evening Sleep (in general) Poor  Pain is worse with: walking, bending, sitting, standing and some activites Pain improves with: rest, heat/ice, therapy/exercise, pacing activities, medication and TENS Relief from Meds: 7  Mobility ability to climb steps?  yes do you drive?  yes  Function I need assistance with the following:  meal prep, household duties and shopping  Neuro/Psych bladder control problems bowel control problems weakness numbness tremor tingling trouble walking spasms dizziness  Prior Studies Any changes since last visit?  no  Physicians involved in your care Any changes since last visit?  no   Family History  Problem Relation Age of Onset  . Thyroid disease Mother   . Colon polyps Father   . Lung cancer Maternal Grandmother   . Cancer Paternal Grandmother        colon/pancreatiec/lymphoma  . Irritable bowel syndrome Brother   . AAA (abdominal aortic aneurysm) Neg Hx    Social History   Socioeconomic History  . Marital status: Married    Spouse name: Not on file  . Number of children: Not on file  . Years of education: Not on file  . Highest education level:  Not on file  Occupational History  . Occupation: disabled    Employer: [employer]  Tobacco Use  . Smoking status: Never Smoker  . Smokeless tobacco: Never Used  Vaping Use  . Vaping Use: Never used  Substance and Sexual Activity  . Alcohol use: No    Alcohol/week: 0.0 standard drinks  . Drug use: No  . Sexual activity: Not on file  Other Topics Concern  . Not on file  Social History Narrative  . Not on file   Social Determinants of Health   Financial Resource Strain:   . Difficulty of Paying Living Expenses:   Food Insecurity:   . Worried About Programme researcher, broadcasting/film/video in the Last Year:   . Barista in the Last Year:   Transportation Needs:   . Freight forwarder (Medical):   Marland Kitchen Lack of Transportation (Non-Medical):   Physical Activity:   . Days of Exercise per Week:   . Minutes of Exercise per Session:   Stress:   . Feeling of Stress :   Social Connections:   . Frequency of Communication with Friends and Family:   . Frequency of Social Gatherings with Friends and Family:   . Attends Religious Services:   . Active Member of Clubs or Organizations:   . Attends Banker Meetings:   Marland Kitchen Marital Status:    Past Surgical History:  Procedure Laterality Date  . 24 HOUR PH STUDY N/A  10/29/2015   Procedure: 24 HOUR PH STUDY;  Surgeon: Iva Boop, MD;  Location: [facility];  Service: Endoscopy;  Laterality: N/A;  . COLONOSCOPY    . ESOPHAGEAL MANOMETRY N/A 10/29/2015   Procedure: ESOPHAGEAL MANOMETRY (EM);  Surgeon: Iva Boop, MD;  Location: [facility];  Service: Endoscopy;  Laterality: N/A;  . KNEE SURGERY  2002/2003   bil  . RECTAL SURGERY  correction of prolapse   2010   Past Medical History:  Diagnosis Date  . Allergy   . Arthritis    HANDS,HIPS,KNEES  . Bronchitis, chronic/intermittent 01/22/2012  . Depression 01/22/2012  . GERD (gastroesophageal reflux disease)   . Hyperlipidemia   . IBS (irritable bowel syndrome)  01/22/2012  . Lupus (HCC)   . Neuromuscular disorder (HCC)   . Osteoporosis   . Seizures (HCC)   . SOB (shortness of breath)   . Thyroid disease    BP 102/78   Pulse 81   Temp 97.9 F (36.6 C)   Ht 5' 7.5" (1.715 m)   Wt 209 lb 6.4 oz (95 kg)   SpO2 99%   BMI 32.31 kg/m   Opioid Risk Score:   Fall Risk Score:  `1  Depression screen PHQ 2/9  Depression screen [facility] 2/9 01/20/2020 10/25/2019 07/22/2019 05/12/2019 03/17/2019 11/05/2018 08/13/2018  Decreased Interest 0 0 0 1 0 1 0  Down, Depressed, Hopeless 0 0 0 1 0 0 0  PHQ - 2 Score 0 0 0 2 0 1 0  Altered sleeping - - - - - - -  Tired, decreased energy - - - - - - -  Change in appetite - - - - - - -  Feeling bad or failure about yourself  - - - - - - -  Trouble concentrating - - - - - - -  Moving slowly or fidgety/restless - - - - - - -  Suicidal thoughts - - - - - - -  PHQ-9 Score - - - - - - -  Some recent data might be hidden    Review of Systems  Constitutional: Negative.   HENT: Negative.   Eyes: Negative.   Respiratory: Negative.   Cardiovascular: Negative.   Gastrointestinal: Negative.   Endocrine: Negative.   Genitourinary: Negative.        Bladder control  Musculoskeletal: Positive for gait problem.       Spasms  Skin: Negative.   Allergic/Immunologic: Negative.   Neurological: Positive for tremors, weakness and numbness.       Tingling  Hematological: Negative.   Psychiatric/Behavioral: Negative.   All other systems reviewed and are negative.      Objective:   Physical Exam Vitals and nursing note reviewed.  Constitutional:      Appearance: Normal appearance.  Cardiovascular:     Rate and Rhythm: Normal rate and regular rhythm.     Pulses: Normal pulses.     Heart sounds: Normal heart sounds.  Pulmonary:     Effort: Pulmonary effort is normal.     Breath sounds: Normal breath sounds.  Musculoskeletal:     Cervical back: Normal range of motion and neck supple.     Comments: Normal Muscle Bulk and  Muscle Testing Reveals:  Upper Extremities: Full ROM and Muscle Strength 5/5 Thoracic Paraspinal Tenderness: T-7-T-9 Lumbar Paraspinal Tenderness: L-3-L-5 Lower Extremities: Full ROM and Muscle Strength 5/5 Arises from table with ease Narrow Based  Gait   Skin:    General: Skin is warm and  dry.  Neurological:     Mental Status: She is alert and oriented to person, place, and time.  Psychiatric:        Mood and Affect: Mood normal.        Behavior: Behavior normal.           Assessment & Plan:  1.Chronic seizure disorder: No seizure's. Continuecurrent medication regimen withGabapentin. Neurology Following.05/11/2020. 2. Chronic muscle spasms, weakness with associated pain disorder: Continuecurrent medication regimen withBaclofen. Continue with Exercise regime.05/11/2020. 3. Chronic dysphagia:No complaints today. Continue to monitor.GI Following.05/11/2020. 4. Anxiety with depression : Stable. Continuecurrent medication regimen withElavil.05/11/2020. 5. Fibromyalgia/ Rib Pain/Chronic Pain:Continue Diclofenac: Continue with exercise and heat Therapy.05/11/2020. Refilled: oxyCODONE 7.5/325mg  onetablet 5 times daily as needed #150. Second script sent for the following month. Continue with slow weaning.We will continue opioid monitoring program, this consists of regular clinic visits, examinations, urine drug screen, pill counts as well as use of [place] Controlled Substance Reporting System. 6. Peripheral Neuropathy:Continuecurrent medication regimen withGabapentin:05/11/2020.  20 minutes of face to face patient care time was spent during this visit. All questions were encouraged and answered.  F/U in 2 months

## 2020-05-12 LAB — CBC WITH DIFFERENTIAL/PLATELET
Basophils Absolute: 0.2 10*3/uL (ref 0.0–0.2)
Basos: 1 %
EOS (ABSOLUTE): 0.1 10*3/uL (ref 0.0–0.4)
Eos: 1 %
Hematocrit: 39.6 % (ref 34.0–46.6)
Hemoglobin: 13.4 g/dL (ref 11.1–15.9)
Immature Grans (Abs): 0.2 10*3/uL — ABNORMAL HIGH (ref 0.0–0.1)
Immature Granulocytes: 2 %
Lymphocytes Absolute: 3 10*3/uL (ref 0.7–3.1)
Lymphs: 22 %
MCH: 30.2 pg (ref 26.6–33.0)
MCHC: 33.8 g/dL (ref 31.5–35.7)
MCV: 89 fL (ref 79–97)
Monocytes Absolute: 1 10*3/uL — ABNORMAL HIGH (ref 0.1–0.9)
Monocytes: 7 %
Neutrophils Absolute: 9.1 10*3/uL — ABNORMAL HIGH (ref 1.4–7.0)
Neutrophils: 67 %
Platelets: 325 10*3/uL (ref 150–450)
RBC: 4.43 x10E6/uL (ref 3.77–5.28)
RDW: 12.2 % (ref 11.7–15.4)
WBC: 13.6 10*3/uL — ABNORMAL HIGH (ref 3.4–10.8)

## 2020-05-12 LAB — THYROID PANEL WITH TSH
Free Thyroxine Index: 1.7 (ref 1.2–4.9)
T3 Uptake Ratio: 25 % (ref 24–39)
T4, Total: 6.9 ug/dL (ref 4.5–12.0)
TSH: 1.56 u[IU]/mL (ref 0.450–4.500)

## 2020-05-12 LAB — LIPID PANEL
Chol/HDL Ratio: 2.7 ratio (ref 0.0–4.4)
Cholesterol, Total: 180 mg/dL (ref 100–199)
HDL: 67 mg/dL (ref >39)
LDL Chol Calc (NIH): 75 mg/dL (ref 0–99)
Triglycerides: 233 mg/dL — ABNORMAL HIGH (ref 0–149)
VLDL Cholesterol Cal: 38 mg/dL (ref 5–40)

## 2020-05-12 LAB — CMP14+EGFR
ALT: 22 IU/L (ref 0–32)
AST: 21 IU/L (ref 0–40)
Albumin/Globulin Ratio: 1.6 (ref 1.2–2.2)
Albumin: 4.2 g/dL (ref 3.8–4.8)
Alkaline Phosphatase: 97 IU/L (ref 48–121)
BUN/Creatinine Ratio: 14 (ref 9–23)
BUN: 10 mg/dL (ref 6–20)
Bilirubin Total: 0.3 mg/dL (ref 0.0–1.2)
CO2: 22 mmol/L (ref 20–29)
Calcium: 9.4 mg/dL (ref 8.7–10.2)
Chloride: 109 mmol/L — ABNORMAL HIGH (ref 96–106)
Creatinine, Ser: 0.71 mg/dL (ref 0.57–1.00)
GFR calc Af Amer: 127 mL/min/{1.73_m2} (ref >59)
GFR calc non Af Amer: 110 mL/min/{1.73_m2} (ref >59)
Globulin, Total: 2.7 g/dL (ref 1.5–4.5)
Glucose: 95 mg/dL (ref 65–99)
Potassium: 3.9 mmol/L (ref 3.5–5.2)
Sodium: 143 mmol/L (ref 134–144)
Total Protein: 6.9 g/dL (ref 6.0–8.5)

## 2020-05-13 ENCOUNTER — Other Ambulatory Visit: Payer: Self-pay | Admitting: Nurse Practitioner

## 2020-05-16 LAB — DRUG TOX MONITOR 1 W/CONF, ORAL FLD
Amphetamines: NEGATIVE ng/mL (ref <10)
Barbiturates: NEGATIVE ng/mL (ref <10)
Benzodiazepines: NEGATIVE ng/mL (ref <0.50)
Buprenorphine: NEGATIVE ng/mL (ref <0.10)
Cocaine: NEGATIVE ng/mL (ref <5.0)
Codeine: NEGATIVE ng/mL (ref <2.5)
Dihydrocodeine: NEGATIVE ng/mL (ref <2.5)
Fentanyl: NEGATIVE ng/mL (ref <0.10)
Heroin Metabolite: NEGATIVE ng/mL (ref <1.0)
Hydrocodone: NEGATIVE ng/mL (ref <2.5)
Hydromorphone: NEGATIVE ng/mL (ref <2.5)
MARIJUANA: NEGATIVE ng/mL (ref <2.5)
MDMA: NEGATIVE ng/mL (ref <10)
Meprobamate: NEGATIVE ng/mL (ref <2.5)
Methadone: NEGATIVE ng/mL (ref <5.0)
Morphine: NEGATIVE ng/mL (ref <2.5)
Nicotine Metabolite: NEGATIVE ng/mL (ref <5.0)
Norhydrocodone: NEGATIVE ng/mL (ref <2.5)
Noroxycodone: 23.3 ng/mL — ABNORMAL HIGH (ref <2.5)
Opiates: POSITIVE ng/mL — AB (ref <2.5)
Oxycodone: 165.9 ng/mL — ABNORMAL HIGH (ref <2.5)
Oxymorphone: NEGATIVE ng/mL (ref <2.5)
Phencyclidine: NEGATIVE ng/mL (ref <10)
Tapentadol: NEGATIVE ng/mL (ref <5.0)
Tramadol: NEGATIVE ng/mL (ref <5.0)
Zolpidem: NEGATIVE ng/mL (ref <5.0)

## 2020-05-16 LAB — DRUG TOX ALC METAB W/CON, ORAL FLD: Alcohol Metabolite: NEGATIVE ng/mL (ref <25)

## 2020-05-21 ENCOUNTER — Other Ambulatory Visit: Payer: Self-pay | Admitting: Nurse Practitioner

## 2020-05-21 DIAGNOSIS — M3219 Other organ or system involvement in systemic lupus erythematosus: Secondary | ICD-10-CM

## 2020-05-25 ENCOUNTER — Telehealth: Payer: Self-pay | Admitting: *Deleted

## 2020-05-25 NOTE — Telephone Encounter (Signed)
Oral swab drug screen was consistent for prescribed medications.  ?

## 2020-07-04 ENCOUNTER — Other Ambulatory Visit: Payer: Self-pay | Admitting: Nurse Practitioner

## 2020-07-10 ENCOUNTER — Other Ambulatory Visit: Payer: Self-pay

## 2020-07-10 ENCOUNTER — Encounter: Payer: Medicare Other | Attending: Registered Nurse | Admitting: Registered Nurse

## 2020-07-10 VITALS — BP 125/84 | HR 81 | Temp 98.8°F | Ht 67.0 in | Wt 211.0 lb

## 2020-07-10 DIAGNOSIS — G894 Chronic pain syndrome: Secondary | ICD-10-CM | POA: Diagnosis not present

## 2020-07-10 DIAGNOSIS — G609 Hereditary and idiopathic neuropathy, unspecified: Secondary | ICD-10-CM | POA: Diagnosis not present

## 2020-07-10 DIAGNOSIS — M546 Pain in thoracic spine: Secondary | ICD-10-CM | POA: Diagnosis not present

## 2020-07-10 DIAGNOSIS — M7061 Trochanteric bursitis, right hip: Secondary | ICD-10-CM

## 2020-07-10 DIAGNOSIS — Z79891 Long term (current) use of opiate analgesic: Secondary | ICD-10-CM

## 2020-07-10 DIAGNOSIS — M6249 Contracture of muscle, multiple sites: Secondary | ICD-10-CM | POA: Insufficient documentation

## 2020-07-10 DIAGNOSIS — M62838 Other muscle spasm: Secondary | ICD-10-CM

## 2020-07-10 DIAGNOSIS — G8929 Other chronic pain: Secondary | ICD-10-CM

## 2020-07-10 DIAGNOSIS — M7062 Trochanteric bursitis, left hip: Secondary | ICD-10-CM

## 2020-07-10 DIAGNOSIS — M545 Low back pain: Secondary | ICD-10-CM | POA: Diagnosis not present

## 2020-07-10 DIAGNOSIS — R569 Unspecified convulsions: Secondary | ICD-10-CM | POA: Diagnosis not present

## 2020-07-10 DIAGNOSIS — F333 Major depressive disorder, recurrent, severe with psychotic symptoms: Secondary | ICD-10-CM | POA: Diagnosis not present

## 2020-07-10 DIAGNOSIS — M797 Fibromyalgia: Secondary | ICD-10-CM

## 2020-07-10 DIAGNOSIS — Z5181 Encounter for therapeutic drug level monitoring: Secondary | ICD-10-CM

## 2020-07-10 DIAGNOSIS — M329 Systemic lupus erythematosus, unspecified: Secondary | ICD-10-CM | POA: Diagnosis not present

## 2020-07-10 MED ORDER — OXYCODONE-ACETAMINOPHEN 7.5-325 MG PO TABS
1.0000 | ORAL_TABLET | Freq: Every day | ORAL | 0 refills | Status: DC | PRN
Start: 2020-07-10 — End: 2020-09-11

## 2020-07-10 NOTE — Progress Notes (Signed)
Subjective:    Patient ID: Autumn Davis, female    DOB: 06-09-1986, 34 y.o.   MRN: [id number]  HPI: Autumn Davis is a 34 y.o. female who returns for follow up appointment for chronic pain and medication refill. She states her pain is located in her mid- back and bilateral hips L>R. She rates her pain 4. Her current exercise regime is walking.  Autumn Davis Morphine equivalent is 56.25   MME.    Last Oral Swab was Performed on 05/11/2020, it was consistent.   Pain Inventory Average Pain 7 Pain Right Now 4 My pain is sharp, burning, dull, stabbing, tingling and aching  In the last 24 hours, has pain interfered with the following? General activity 8 Relation with others 6 Enjoyment of life 6 What TIME of day is your pain at its worst? evening and night Sleep (in general) Poor  Pain is worse with: walking, bending, sitting, inactivity, standing and some activites Pain improves with: rest, heat/ice, therapy/exercise, pacing activities, medication and TENS Relief from Meds: 6  Family History  Problem Relation Age of Onset  . Thyroid disease Mother   . Colon polyps Father   . Lung cancer Maternal Grandmother   . Cancer Paternal Grandmother        colon/pancreatiec/lymphoma  . Irritable bowel syndrome Brother   . AAA (abdominal aortic aneurysm) Neg Hx    Social History   Socioeconomic History  . Marital status: Married    Spouse name: Not on file  . Number of children: Not on file  . Years of education: Not on file  . Highest education level: Not on file  Occupational History  . Occupation: disabled    Employer: [employer]  Tobacco Use  . Smoking status: Never Smoker  . Smokeless tobacco: Never Used  Vaping Use  . Vaping Use: Never used  Substance and Sexual Activity  . Alcohol use: No    Alcohol/week: 0.0 standard drinks  . Drug use: No  . Sexual activity: Not on file  Other Topics Concern  . Not on file  Social History Narrative  . Not on file    Social Determinants of Health   Financial Resource Strain:   . Difficulty of Paying Living Expenses: Not on file  Food Insecurity:   . Worried About Programme researcher, broadcasting/film/video in the Last Year: Not on file  . Ran Out of Food in the Last Year: Not on file  Transportation Needs:   . Lack of Transportation (Medical): Not on file  . Lack of Transportation (Non-Medical): Not on file  Physical Activity:   . Days of Exercise per Week: Not on file  . Minutes of Exercise per Session: Not on file  Stress:   . Feeling of Stress : Not on file  Social Connections:   . Frequency of Communication with Friends and Family: Not on file  . Frequency of Social Gatherings with Friends and Family: Not on file  . Attends Religious Services: Not on file  . Active Member of Clubs or Organizations: Not on file  . Attends Banker Meetings: Not on file  . Marital Status: Not on file   Past Surgical History:  Procedure Laterality Date  . 24 HOUR PH STUDY N/A 10/29/2015   Procedure: 24 HOUR PH STUDY;  Surgeon: Iva Boop, MD;  Location: [facility];  Service: Endoscopy;  Laterality: N/A;  . COLONOSCOPY    . ESOPHAGEAL MANOMETRY N/A 10/29/2015   Procedure: ESOPHAGEAL MANOMETRY (  EM);  Surgeon: Iva Boop, MD;  Location: [facility];  Service: Endoscopy;  Laterality: N/A;  . KNEE SURGERY  2002/2003   bil  . RECTAL SURGERY  correction of prolapse   2010   Past Surgical History:  Procedure Laterality Date  . 24 HOUR PH STUDY N/A 10/29/2015   Procedure: 24 HOUR PH STUDY;  Surgeon: Iva Boop, MD;  Location: [facility];  Service: Endoscopy;  Laterality: N/A;  . COLONOSCOPY    . ESOPHAGEAL MANOMETRY N/A 10/29/2015   Procedure: ESOPHAGEAL MANOMETRY (EM);  Surgeon: Iva Boop, MD;  Location: [facility];  Service: Endoscopy;  Laterality: N/A;  . KNEE SURGERY  2002/2003   bil  . RECTAL SURGERY  correction of prolapse   2010   Past Medical History:  Diagnosis Date  . Allergy    . Arthritis    HANDS,HIPS,KNEES  . Bronchitis, chronic/intermittent 01/22/2012  . Depression 01/22/2012  . GERD (gastroesophageal reflux disease)   . Hyperlipidemia   . IBS (irritable bowel syndrome) 01/22/2012  . Lupus (HCC)   . Neuromuscular disorder (HCC)   . Osteoporosis   . Seizures (HCC)   . SOB (shortness of breath)   . Thyroid disease    BP 125/84   Pulse 81   Temp 98.8 F (37.1 C)   Ht 5\' 7"  (1.702 m)   Wt 211 lb (95.7 kg)   SpO2 96%   BMI 33.05 kg/m   Opioid Risk Score:   Fall Risk Score:  `1  Depression screen PHQ 2/9  Depression screen [facility] 2/9 05/11/2020 01/20/2020 10/25/2019 07/22/2019 05/12/2019 03/17/2019 11/05/2018  Decreased Interest 0 0 0 0 1 0 1  Down, Depressed, Hopeless 0 0 0 0 1 0 0  PHQ - 2 Score 0 0 0 0 2 0 1  Altered sleeping 0 - - - - - -  Tired, decreased energy 0 - - - - - -  Change in appetite 0 - - - - - -  Feeling bad or failure about yourself  0 - - - - - -  Trouble concentrating 0 - - - - - -  Moving slowly or fidgety/restless 0 - - - - - -  Suicidal thoughts 0 - - - - - -  PHQ-9 Score 0 - - - - - -  Difficult doing work/chores Not difficult at all - - - - - -  Some recent data might be hidden    Review of Systems  Musculoskeletal: Positive for back pain and gait problem.  Neurological: Positive for weakness and numbness.  All other systems reviewed and are negative.      Objective:   Physical Exam Vitals and nursing note reviewed.  Constitutional:      Appearance: Normal appearance.  Cardiovascular:     Rate and Rhythm: Normal rate and regular rhythm.     Pulses: Normal pulses.     Heart sounds: Normal heart sounds.  Pulmonary:     Effort: Pulmonary effort is normal.     Breath sounds: Normal breath sounds.  Musculoskeletal:     Cervical back: Normal range of motion and neck supple.     Comments: Normal Muscle Bulk and Muscle Testing Reveals:  Upper Extremities: Full ROM and Muscle Strength 5/5 Thoracic Hypersensitivity: T-1-T-3  T-7-T-9 Lumbar Hypersensitivity Bilateral Greater Trochanteric Tenderness: L>R Lower Extremities: Full ROM and Muscle Strength 5/5 Arises from Table with ease Narrow Based Gait   Skin:    General: Skin is warm and dry.  Neurological:     Mental Status: She is alert and oriented to person, place, and time.  Psychiatric:        Mood and Affect: Mood normal.        Behavior: Behavior normal.           Assessment & Plan:  1.Chronic seizure disorder: No seizure's. Continuecurrent medication regimen withGabapentin. Neurology Following.07/10/2020. 2. Chronic muscle spasms, weakness with associated pain disorder: Continuecurrent medication regimen withBaclofen. Continue with Exercise regime.07/10/2020. 3. Chronic dysphagia:No complaints today. Continue to monitor.GI Following.07/10/2020. 4. Anxiety with depression : Stable. Continuecurrent medication regimen withElavil.07/10/2020. 5. Fibromyalgia/ Rib Pain/Chronic Pain:Continue Diclofenac: Continue with exercise and heat Therapy.07/10/2020. Refilled: oxyCODONE 7.5/325mg  onetablet 5 times daily as needed #150. Second script sent for the following month.Continue with slow weaning. We will continue the opioid monitoring program, this consists of regular clinic visits, examinations, urine drug screen, pill counts as well as use of [place] Controlled Substance Reporting system. A 12 month History has been reviewed on the [place] Controlled Substance Reporting System 07/10/2020.  6. Peripheral Neuropathy:Continuecurrent medication regimen withGabapentin:07/10/2020.  20 minutes of face to face patient care time was spent during this visit. All questions were encouraged and answered.  F/U in 2 months

## 2020-07-12 ENCOUNTER — Encounter: Payer: Self-pay | Admitting: Registered Nurse

## 2020-08-08 ENCOUNTER — Other Ambulatory Visit: Payer: Self-pay | Admitting: Nurse Practitioner

## 2020-08-08 DIAGNOSIS — E559 Vitamin D deficiency, unspecified: Secondary | ICD-10-CM

## 2020-08-10 ENCOUNTER — Other Ambulatory Visit: Payer: Self-pay

## 2020-08-10 ENCOUNTER — Ambulatory Visit (INDEPENDENT_AMBULATORY_CARE_PROVIDER_SITE_OTHER): Payer: Medicare Other | Admitting: Nurse Practitioner

## 2020-08-10 ENCOUNTER — Encounter: Payer: Self-pay | Admitting: Nurse Practitioner

## 2020-08-10 VITALS — BP 114/85 | HR 90 | Temp 96.5°F | Ht 67.0 in | Wt 211.8 lb

## 2020-08-10 DIAGNOSIS — K581 Irritable bowel syndrome with constipation: Secondary | ICD-10-CM

## 2020-08-10 DIAGNOSIS — E079 Disorder of thyroid, unspecified: Secondary | ICD-10-CM

## 2020-08-10 DIAGNOSIS — R350 Frequency of micturition: Secondary | ICD-10-CM

## 2020-08-10 DIAGNOSIS — E782 Mixed hyperlipidemia: Secondary | ICD-10-CM

## 2020-08-10 DIAGNOSIS — R569 Unspecified convulsions: Secondary | ICD-10-CM | POA: Diagnosis not present

## 2020-08-10 DIAGNOSIS — K219 Gastro-esophageal reflux disease without esophagitis: Secondary | ICD-10-CM

## 2020-08-10 DIAGNOSIS — I479 Paroxysmal tachycardia, unspecified: Secondary | ICD-10-CM | POA: Diagnosis not present

## 2020-08-10 DIAGNOSIS — M3219 Other organ or system involvement in systemic lupus erythematosus: Secondary | ICD-10-CM

## 2020-08-10 DIAGNOSIS — R609 Edema, unspecified: Secondary | ICD-10-CM

## 2020-08-10 DIAGNOSIS — D8989 Other specified disorders involving the immune mechanism, not elsewhere classified: Secondary | ICD-10-CM

## 2020-08-10 DIAGNOSIS — F3342 Major depressive disorder, recurrent, in full remission: Secondary | ICD-10-CM

## 2020-08-10 DIAGNOSIS — M329 Systemic lupus erythematosus, unspecified: Secondary | ICD-10-CM | POA: Diagnosis not present

## 2020-08-10 MED ORDER — BUDESONIDE-FORMOTEROL FUMARATE 80-4.5 MCG/ACT IN AERO: 2.0000 | INHALATION_SPRAY | Freq: Two times a day (BID) | RESPIRATORY_TRACT | 5 refills | Status: DC

## 2020-08-10 MED ORDER — FUROSEMIDE 20 MG PO TABS: 20.0000 mg | ORAL_TABLET | Freq: Every day | ORAL | 1 refills | Status: DC

## 2020-08-10 MED ORDER — ACETAZOLAMIDE 250 MG PO TABS: ORAL_TABLET | ORAL | 1 refills | Status: DC

## 2020-08-10 MED ORDER — BACLOFEN 20 MG PO TABS: ORAL_TABLET | ORAL | 1 refills | Status: DC

## 2020-08-10 MED ORDER — GABAPENTIN 600 MG PO TABS: ORAL_TABLET | ORAL | 1 refills | Status: DC

## 2020-08-10 MED ORDER — METOPROLOL SUCCINATE ER 25 MG PO TB24: 25.0000 mg | ORAL_TABLET | Freq: Every day | ORAL | 1 refills | Status: DC

## 2020-08-10 MED ORDER — ATORVASTATIN CALCIUM 40 MG PO TABS: ORAL_TABLET | ORAL | 1 refills | Status: DC

## 2020-08-10 MED ORDER — TAMSULOSIN HCL 0.4 MG PO CAPS: ORAL_CAPSULE | ORAL | 1 refills | Status: DC

## 2020-08-10 NOTE — Progress Notes (Signed)
Subjective:    Patient ID: Autumn Davis, female    DOB: [birth year], 34 y.o.   MRN: [id number]   Chief Complaint: medical management of chronic issues      HPI:  1. Mixed hyperlipidemia Does try to watch diet and tries to stay active. Not able to do a lot of exercise. Lab Results  Component Value Date   CHOL 180 05/11/2020   HDL 67 05/11/2020   LDLCALC 75 05/11/2020   TRIG 233 (H) 05/11/2020   CHOLHDL 2.7 05/11/2020     2. Irritable bowel syndrome with constipation Alternates between diarrhea and constipation. Is able to handle it without any problems. She does take baclofen when needed which helps.  3. Gastroesophageal reflux disease without esophagitis Is doing well. Swallowing has improved  4. Tachycardia, paroxysmal (HCC) No episodes since last visit.  5. Thyroid disease No problems that she is aware of. Lab Results  Component Value Date   TSH 1.560 05/11/2020     6. Recurrent major depressive disorder, in full remission [facility]) She is currently doing well. Is on no prescripton medication  7. Inflammatory autoimmune disorder (HCC) They are still not 100% sure exactly what her disorder is. They have just been treating symptoms. She is on prednisone daily and sees pain management for her pain. She has several specialist that she sess and is currently maintaining functionality  8. Systemic lupus erythematosus, unspecified SLE type, unspecified organ involvement status (HCC) Not 100% sure she has lupus. They have kept this dx on her chart, however her labs have not bee conclusive that this is what she has.  9. Seizures (HCC) She is still on diamox and has not had any seizure activity in several years. Sh ewas able to get her drivers license back several weeks ago    Outpatient Encounter Medications as of 08/10/2020  Medication Sig  . acetaZOLAMIDE (DIAMOX) 250 MG tablet TAKE 1 TABLET EVERY MORNING, TAKE 1 TABLET AT 2PM, AND TAKE 2 TABLETSAT BEDTIME  .  amitriptyline (ELAVIL) 75 MG tablet Take 1 tablet (75 mg total) by mouth at bedtime.  Marland Kitchen atorvastatin (LIPITOR) 40 MG tablet TAKE ONE (1) TABLET EACH DAY  . baclofen (LIORESAL) 20 MG tablet TAKE ONE TABLET FOUR TIMES DAILY  . budesonide-formoterol (SYMBICORT) 80-4.5 MCG/ACT inhaler Inhale 2 puffs into the lungs 2 (two) times daily.  . Calcium-Magnesium-Vitamin D (CALCIUM 1200+D3 PO) Take 1 tablet by mouth daily.  . cetirizine (ZYRTEC) 10 MG tablet Take 10 mg by mouth daily.  . Cholecalciferol (VITAMIN D3) 50000 units CAPS Take 50,000 Units by mouth once a week.  . cyanocobalamin (,VITAMIN B-12,) 1000 MCG/ML injection INJECT IM EVERY 30 DAYS  . Cyanocobalamin (B12 FAST DISSOLVE PO) Take by mouth 2 (two) times daily.  . cycloSPORINE (RESTASIS) 0.05 % ophthalmic emulsion Place 1 drop into both eyes 2 (two) times daily.  . diclofenac (VOLTAREN) 75 MG EC tablet TAKE ONE TABLET BY MOUTH TWICE DAILY  . docusate sodium (COLACE) 100 MG capsule Take 100 mg by mouth 2 (two) times daily. PRN  . EPINEPHRINE 0.3 mg/0.3 mL IJ SOAJ injection USE AS DIRECTED  . furosemide (LASIX) 20 MG tablet Take 1 tablet (20 mg total) by mouth daily.  Marland Kitchen gabapentin (NEURONTIN) 600 MG tablet TAKE 1 TABLET 5 TIMES DAILY  . ipratropium-albuterol (DUONEB) 0.5-2.5 (3) MG/3ML SOLN Take 3 mLs by nebulization every 4 (four) hours as needed.  Marland Kitchen L-Methylfolate 15 MG TABS Take 1 tablet (15 mg total) by mouth daily.  Marland Kitchen  metoprolol succinate (TOPROL-XL) 25 MG 24 hr tablet Take 1 tablet (25 mg total) by mouth daily.  . montelukast (SINGULAIR) 10 MG tablet TAKE ONE TABLET DAILY AT BEDTIME  . mupirocin ointment (BACTROBAN) 2 % Apply 1 application topically 2 (two) times daily.  Marland Kitchen nystatin (MYCOSTATIN) 100000 UNIT/ML suspension Use as directed 5 mLs (500,000 Units total) in the mouth or throat 4 (four) times daily.  Marland Kitchen nystatin-triamcinolone ointment (MYCOLOG) Apply 1 application topically 2 (two) times daily.  . Omega 3 1000 MG CAPS Take 1  capsule by mouth daily.  Marland Kitchen oxyCODONE-acetaminophen (PERCOCET) 7.5-325 MG tablet Take 1 tablet by mouth 5 (five) times daily as needed for moderate pain. No More Than 5 a day.  . predniSONE (DELTASONE) 5 MG tablet Take 1 tablet (5 mg total) by mouth every morning.  . promethazine (PHENERGAN) 12.5 MG tablet Take 1 tablet (12.5 mg total) by mouth every 8 (eight) hours as needed for nausea or vomiting.  . QNASL 80 MCG/ACT AERS USE 2 SPRAYS IN EACH NOSTRIL DAILY  . tamsulosin (FLOMAX) 0.4 MG CAPS capsule TAKE ONE (1) CAPSULE EACH DAY  . VENTOLIN HFA 108 (90 Base) MCG/ACT inhaler USE 2 PUFFS EVERY 4 TO 6 HOURS AS NEEDED  . Vitamin D, Ergocalciferol, (DRISDOL) 1.25 MG (50000 UNIT) CAPS capsule 1 po on Monday and thursday  . vitamin E (VITAMIN E) 400 UNIT capsule Take 1,000 Units by mouth daily.     Past Surgical History:  Procedure Laterality Date  . 24 HOUR PH STUDY N/A 10/29/2015   Procedure: 24 HOUR PH STUDY;  Surgeon: Iva Boop, MD;  Location: [facility];  Service: Endoscopy;  Laterality: N/A;  . COLONOSCOPY    . ESOPHAGEAL MANOMETRY N/A 10/29/2015   Procedure: ESOPHAGEAL MANOMETRY (EM);  Surgeon: Iva Boop, MD;  Location: [facility];  Service: Endoscopy;  Laterality: N/A;  . KNEE SURGERY  2002/2003   bil  . RECTAL SURGERY  correction of prolapse   2010    Family History  Problem Relation Age of Onset  . Thyroid disease Mother   . Colon polyps Father   . Lung cancer Maternal Grandmother   . Cancer Paternal Grandmother        colon/pancreatiec/lymphoma  . Irritable bowel syndrome Brother   . AAA (abdominal aortic aneurysm) Neg Hx     New complaints: She has been experiencing a lot of fatigue the last several weeks. But this happens frequently  Social history: Lives with her parents right now  Controlled substance contract: n/a    Review of Systems  Constitutional: Negative for diaphoresis.  Eyes: Negative for pain.  Respiratory: Negative for shortness of  breath.   Cardiovascular: Negative for chest pain, palpitations and leg swelling.  Gastrointestinal: Negative for abdominal pain.  Endocrine: Negative for polydipsia.  Skin: Negative for rash.  Neurological: Negative for dizziness, weakness and headaches.  Hematological: Does not bruise/bleed easily.  All other systems reviewed and are negative.      Objective:   Physical Exam Vitals and nursing note reviewed.  Constitutional:      General: She is not in acute distress.    Appearance: Normal appearance. She is well-developed.  HENT:     Head: Normocephalic.     Nose: Nose normal.  Eyes:     Pupils: Pupils are equal, round, and reactive to light.  Neck:     Vascular: No carotid bruit or JVD.  Cardiovascular:     Rate and Rhythm: Normal rate and regular rhythm.  Heart sounds: Normal heart sounds.  Pulmonary:     Effort: Pulmonary effort is normal. No respiratory distress.     Breath sounds: Normal breath sounds. No wheezing or rales.  Chest:     Chest wall: No tenderness.  Abdominal:     General: Bowel sounds are normal. There is no distension or abdominal bruit.     Palpations: Abdomen is soft. There is no hepatomegaly, splenomegaly, mass or pulsatile mass.     Tenderness: There is no abdominal tenderness.  Musculoskeletal:        General: Normal range of motion.     Cervical back: Normal range of motion and neck supple.     Comments: Gait slow and steady  Lymphadenopathy:     Cervical: No cervical adenopathy.  Skin:    General: Skin is warm and dry.  Neurological:     Mental Status: She is alert and oriented to person, place, and time.     Deep Tendon Reflexes: Reflexes are normal and symmetric.  Psychiatric:        Behavior: Behavior normal.        Thought Content: Thought content normal.        Judgment: Judgment normal.    BP 114/85   Pulse 90   Temp (!) 96.5 F (35.8 C) (Temporal)   Ht 5\' 7"  (1.702 m)   Wt 211 lb 12.8 oz (96.1 kg)   LMP 06/14/2020  (Approximate)   SpO2 97%   BMI 33.17 kg/m         Assessment & Plan:  Autumn Davis comes in today with chief complaint of Medical Management of Chronic Issues   Diagnosis and orders addressed:  1. Mixed hyperlipidemia Low fat diet - atorvastatin (LIPITOR) 40 MG tablet; TAKE ONE (1) TABLET EACH DAY  Dispense: 90 tablet; Refill: 1  2. Irritable bowel syndrome with constipation Watch diet to prevent flare up - baclofen (LIORESAL) 20 MG tablet; TAKE ONE TABLET FOUR TIMES DAILY  Dispense: 360 each; Refill: 1  3. Gastroesophageal reflux disease without esophagitis Avoid spicy foods Do not eat 2 hours prior to bedtime  4. Tachycardia, paroxysmal (HCC) Avoid caffeine - metoprolol succinate (TOPROL-XL) 25 MG 24 hr tablet; Take 1 tablet (25 mg total) by mouth daily.  Dispense: 90 tablet; Refill: 1  5. Thyroid disease Labs pending  6. Recurrent major depressive disorder, in full remission [facility]) Stress management  7. Inflammatory autoimmune disorder (HCC)  8. Systemic lupus erythematosus, unspecified SLE type, unspecified organ involvement status (HCC) Keep follow up with specialist  9. Seizures (HCC) - acetaZOLAMIDE (DIAMOX) 250 MG tablet; TAKE 1 TABLET EVERY MORNING, TAKE 1 TABLET AT 2PM, AND TAKE 2 TABLETSAT BEDTIME  Dispense: 360 tablet; Refill: 1  10. Peripheral edema Elevate legs when sitting - furosemide (LASIX) 20 MG tablet; Take 1 tablet (20 mg total) by mouth daily.  Dispense: 90 tablet; Refill: 1  11. Systemic lupus erythematosus with other organ involvement, unspecified SLE type (HCC) - gabapentin (NEURONTIN) 600 MG tablet; TAKE 1 TABLET 5 TIMES DAILY  Dispense: 450 tablet; Refill: 1  12. Urinary frequency  - tamsulosin (FLOMAX) 0.4 MG CAPS capsule; TAKE ONE (1) CAPSULE EACH DAY  Dispense: 90 capsule; Refill: 1   Labs pending Health Maintenance reviewed Diet and exercise encouraged  Follow up plan: 3 months   Mary-Margaret Daphine Deutscher, FNP

## 2020-08-10 NOTE — Addendum Note (Signed)
Addended by: [name] on: 08/10/2020 12:51 PM   Modules accepted: Orders

## 2020-08-11 LAB — LIPID PANEL
Chol/HDL Ratio: 2.8 ratio (ref 0.0–4.4)
Cholesterol, Total: 205 mg/dL — ABNORMAL HIGH (ref 100–199)
HDL: 73 mg/dL (ref >39)
LDL Chol Calc (NIH): 106 mg/dL — ABNORMAL HIGH (ref 0–99)
Triglycerides: 151 mg/dL — ABNORMAL HIGH (ref 0–149)
VLDL Cholesterol Cal: 26 mg/dL (ref 5–40)

## 2020-08-11 LAB — CMP14+EGFR
ALT: 24 IU/L (ref 0–32)
AST: 17 IU/L (ref 0–40)
Albumin/Globulin Ratio: 2.2 (ref 1.2–2.2)
Albumin: 4.6 g/dL (ref 3.8–4.8)
Alkaline Phosphatase: 82 IU/L (ref 44–121)
BUN/Creatinine Ratio: 9 (ref 9–23)
BUN: 8 mg/dL (ref 6–20)
Bilirubin Total: 0.2 mg/dL (ref 0.0–1.2)
CO2: 22 mmol/L (ref 20–29)
Calcium: 9.4 mg/dL (ref 8.7–10.2)
Chloride: 109 mmol/L — ABNORMAL HIGH (ref 96–106)
Creatinine, Ser: 0.91 mg/dL (ref 0.57–1.00)
GFR calc Af Amer: 94 mL/min/{1.73_m2} (ref >59)
GFR calc non Af Amer: 81 mL/min/{1.73_m2} (ref >59)
Globulin, Total: 2.1 g/dL (ref 1.5–4.5)
Glucose: 124 mg/dL — ABNORMAL HIGH (ref 65–99)
Potassium: 3.6 mmol/L (ref 3.5–5.2)
Sodium: 145 mmol/L — ABNORMAL HIGH (ref 134–144)
Total Protein: 6.7 g/dL (ref 6.0–8.5)

## 2020-08-11 LAB — CBC WITH DIFFERENTIAL/PLATELET
Basophils Absolute: 0.1 10*3/uL (ref 0.0–0.2)
Basos: 2 %
EOS (ABSOLUTE): 0.1 10*3/uL (ref 0.0–0.4)
Eos: 1 %
Hematocrit: 39.5 % (ref 34.0–46.6)
Hemoglobin: 13.3 g/dL (ref 11.1–15.9)
Immature Grans (Abs): 0 10*3/uL (ref 0.0–0.1)
Immature Granulocytes: 0 %
Lymphocytes Absolute: 2 10*3/uL (ref 0.7–3.1)
Lymphs: 21 %
MCH: 31.2 pg (ref 26.6–33.0)
MCHC: 33.7 g/dL (ref 31.5–35.7)
MCV: 93 fL (ref 79–97)
Monocytes Absolute: 0.7 10*3/uL (ref 0.1–0.9)
Monocytes: 7 %
Neutrophils Absolute: 6.4 10*3/uL (ref 1.4–7.0)
Neutrophils: 69 %
Platelets: 303 10*3/uL (ref 150–450)
RBC: 4.26 x10E6/uL (ref 3.77–5.28)
RDW: 12.6 % (ref 11.7–15.4)
WBC: 9.3 10*3/uL (ref 3.4–10.8)

## 2020-08-11 LAB — THYROID PANEL WITH TSH
Free Thyroxine Index: 1.7 (ref 1.2–4.9)
T3 Uptake Ratio: 27 % (ref 24–39)
T4, Total: 6.4 ug/dL (ref 4.5–12.0)
TSH: 2.53 u[IU]/mL (ref 0.450–4.500)

## 2020-09-05 ENCOUNTER — Encounter: Payer: Self-pay | Admitting: Physical Medicine & Rehabilitation

## 2020-09-11 ENCOUNTER — Encounter: Payer: Self-pay | Admitting: Registered Nurse

## 2020-09-11 ENCOUNTER — Encounter: Payer: Medicare Other | Attending: Registered Nurse | Admitting: Registered Nurse

## 2020-09-11 ENCOUNTER — Other Ambulatory Visit: Payer: Self-pay

## 2020-09-11 VITALS — BP 142/85 | HR 93 | Temp 98.3°F | Ht 67.0 in | Wt 211.6 lb

## 2020-09-11 DIAGNOSIS — M7062 Trochanteric bursitis, left hip: Secondary | ICD-10-CM

## 2020-09-11 DIAGNOSIS — G8929 Other chronic pain: Secondary | ICD-10-CM

## 2020-09-11 DIAGNOSIS — G609 Hereditary and idiopathic neuropathy, unspecified: Secondary | ICD-10-CM

## 2020-09-11 DIAGNOSIS — R569 Unspecified convulsions: Secondary | ICD-10-CM | POA: Diagnosis not present

## 2020-09-11 DIAGNOSIS — Z5181 Encounter for therapeutic drug level monitoring: Secondary | ICD-10-CM | POA: Diagnosis not present

## 2020-09-11 DIAGNOSIS — M546 Pain in thoracic spine: Secondary | ICD-10-CM | POA: Diagnosis not present

## 2020-09-11 DIAGNOSIS — M62838 Other muscle spasm: Secondary | ICD-10-CM

## 2020-09-11 DIAGNOSIS — Z79891 Long term (current) use of opiate analgesic: Secondary | ICD-10-CM

## 2020-09-11 DIAGNOSIS — M7061 Trochanteric bursitis, right hip: Secondary | ICD-10-CM | POA: Diagnosis not present

## 2020-09-11 DIAGNOSIS — G894 Chronic pain syndrome: Secondary | ICD-10-CM

## 2020-09-11 DIAGNOSIS — M797 Fibromyalgia: Secondary | ICD-10-CM

## 2020-09-11 MED ORDER — OXYCODONE-ACETAMINOPHEN 7.5-325 MG PO TABS: 1.0000 | ORAL_TABLET | Freq: Every day | ORAL | 0 refills | Status: DC | PRN

## 2020-09-11 NOTE — Progress Notes (Signed)
Subjective:    Patient ID: Autumn Davis, female    DOB: [birth year], 34 y.o.   MRN: [id number]  HPI: Autumn Davis is a 34 y.o. female who returns for follow up appointment for chronic pain and medication refill. She states her pain is located in her mid- back and bilateral hips. She rates her pain 6. Her  current exercise regime is walking and performing stretching exercises.  [name] Morphine equivalent is 56.25 MME.    Last Oral Swab was Performed on 05/11/2020, it was consistent.   Pain Inventory Average Pain 8 Pain Right Now 6 My pain is sharp, burning, dull, stabbing and tingling  In the last 24 hours, has pain interfered with the following? General activity 9 Relation with others 7 Enjoyment of life 7 What TIME of day is your pain at its worst? evening and night Sleep (in general) Poor  Pain is worse with: walking, bending, sitting, inactivity, standing and some activites Pain improves with: rest, heat/ice, therapy/exercise, pacing activities, medication and TENS Relief from Meds: 6  Family History  Problem Relation Age of Onset  . Thyroid disease Mother   . Colon polyps Father   . Lung cancer Maternal Grandmother   . Cancer Paternal Grandmother        colon/pancreatiec/lymphoma  . Irritable bowel syndrome Brother   . AAA (abdominal aortic aneurysm) Neg Hx    Social History   Socioeconomic History  . Marital status: Married    Spouse name: Not on file  . Number of children: Not on file  . Years of education: Not on file  . Highest education level: Not on file  Occupational History  . Occupation: disabled    Employer: [employer]  Tobacco Use  . Smoking status: Never Smoker  . Smokeless tobacco: Never Used  Vaping Use  . Vaping Use: Never used  Substance and Sexual Activity  . Alcohol use: No    Alcohol/week: 0.0 standard drinks  . Drug use: No  . Sexual activity: Not on file  Other Topics Concern  . Not on file  Social History Narrative    . Not on file   Social Determinants of Health   Financial Resource Strain:   . Difficulty of Paying Living Expenses: Not on file  Food Insecurity:   . Worried About Programme researcher, broadcasting/film/video in the Last Year: Not on file  . Ran Out of Food in the Last Year: Not on file  Transportation Needs:   . Lack of Transportation (Medical): Not on file  . Lack of Transportation (Non-Medical): Not on file  Physical Activity:   . Days of Exercise per Week: Not on file  . Minutes of Exercise per Session: Not on file  Stress:   . Feeling of Stress : Not on file  Social Connections:   . Frequency of Communication with Friends and Family: Not on file  . Frequency of Social Gatherings with Friends and Family: Not on file  . Attends Religious Services: Not on file  . Active Member of Clubs or Organizations: Not on file  . Attends Banker Meetings: Not on file  . Marital Status: Not on file   Past Surgical History:  Procedure Laterality Date  . 24 HOUR PH STUDY N/A 10/29/2015   Procedure: 24 HOUR PH STUDY;  Surgeon: Iva Boop, MD;  [facility];  Service: Endoscopy;  Laterality: N/A;  . COLONOSCOPY    . ESOPHAGEAL MANOMETRY N/A 10/29/2015   Procedure:  ESOPHAGEAL MANOMETRY (EM);  Surgeon: Iva Boop, MD;  [facility];  Service: Endoscopy;  Laterality: N/A;  . KNEE SURGERY  2002/2003   bil  . RECTAL SURGERY  correction of prolapse   2010   Past Surgical History:  Procedure Laterality Date  . 24 HOUR PH STUDY N/A 10/29/2015   Procedure: 24 HOUR PH STUDY;  Surgeon: Iva Boop, MD;  [facility];  Service: Endoscopy;  Laterality: N/A;  . COLONOSCOPY    . ESOPHAGEAL MANOMETRY N/A 10/29/2015   Procedure: ESOPHAGEAL MANOMETRY (EM);  Surgeon: Iva Boop, MD;  [facility];  Service: Endoscopy;  Laterality: N/A;  . KNEE SURGERY  2002/2003   bil  . RECTAL SURGERY  correction of prolapse   2010   Past Medical History:  Diagnosis Date   . Allergy   . Arthritis    HANDS,HIPS,KNEES  . Bronchitis, chronic/intermittent 01/22/2012  . Depression 01/22/2012  . GERD (gastroesophageal reflux disease)   . Hyperlipidemia   . IBS (irritable bowel syndrome) 01/22/2012  . Lupus (HCC)   . Neuromuscular disorder (HCC)   . Osteoporosis   . Seizures (HCC)   . SOB (shortness of breath)   . Thyroid disease    BP (!) 142/85   Pulse 93   Temp 98.3 F (36.8 C)   Ht 5\' 7"  (1.702 m)   Wt 211 lb 9.6 oz (96 kg)   SpO2 95%   BMI 33.14 kg/m   Opioid Risk Score:   Fall Risk Score:  `1  Depression screen PHQ 2/9  Depression screen [facility] 2/9 08/10/2020 05/11/2020 01/20/2020 10/25/2019 07/22/2019 05/12/2019 03/17/2019  Decreased Interest 0 0 0 0 0 1 0  Down, Depressed, Hopeless 0 0 0 0 0 1 0  PHQ - 2 Score 0 0 0 0 0 2 0  Altered sleeping 2 0 - - - - -  Tired, decreased energy 1 0 - - - - -  Change in appetite 1 0 - - - - -  Feeling bad or failure about yourself  0 0 - - - - -  Trouble concentrating 0 0 - - - - -  Moving slowly or fidgety/restless 0 0 - - - - -  Suicidal thoughts 0 0 - - - - -  PHQ-9 Score 4 0 - - - - -  Difficult doing work/chores - Not difficult at all - - - - -  Some recent data might be hidden  142 85  Review of Systems  Musculoskeletal: Positive for back pain.       Ribs, hips, back pain  All other systems reviewed and are negative.      Objective:   Physical Exam Vitals and nursing note reviewed.  Constitutional:      Appearance: Normal appearance.  Cardiovascular:     Rate and Rhythm: Normal rate and regular rhythm.     Pulses: Normal pulses.     Heart sounds: Normal heart sounds.  Pulmonary:     Effort: Pulmonary effort is normal.     Breath sounds: Normal breath sounds.  Musculoskeletal:     Cervical back: Normal range of motion and neck supple.     Comments: Normal Muscle Bulk and Muscle Testing Reveals:  Upper Extremities: Full ROM and Muscle Strength 5/5 Thoracic Paraspinal Tenderness: T-7-T-9 Lumbar  Paraspinal Tenderness: L-3-L-5 Lower Extremities: Full ROM and Muscle Strength 5/5 Arises from Table with ease Narrow Based  Gait   Skin:    General: Skin  is warm and dry.  Neurological:     Mental Status: She is alert and oriented to person, place, and time.  Psychiatric:        Mood and Affect: Mood normal.        Behavior: Behavior normal.           Assessment & Plan:  1.Chronic seizure disorder: No seizure's. Continuecurrent medication regimen withGabapentin. Neurology Following.09/11/2020. 2. Chronic muscle spasms, weakness with associated pain disorder: Continuecurrent medication regimen withBaclofen. Continue with Exercise regime.09/11/2020. 3. Chronic dysphagia:No complaints today. Continue to monitor.GI Following.09/11/2020. 4. Anxiety with depression : Stable. Continuecurrent medication regimen withElavil.09/11/2020. 5. Fibromyalgia/ Rib Pain/Chronic Pain:Continue Diclofenac: Continue with exercise and heat Therapy.09/11/2020. Refilled: oxyCODONE 7.5/325mg  onetablet 5 times daily as needed #140. Second script sent for the following month.Continue with slow weaning. We will continue the opioid monitoring program, this consists of regular clinic visits, examinations, urine drug screen, pill counts as well as use of [place] Controlled Substance Reporting system. A 12 month History has been reviewed on the [place] Controlled Substance Reporting System 09/11/2020.  6. Peripheral Neuropathy:Continuecurrent medication regimen withGabapentin:09/11/2020.  of face to face patient care time was spent during this visit. All questions were encouraged and answered.  F/U in28months

## 2020-11-05 ENCOUNTER — Encounter: Payer: Medicare Other | Attending: Registered Nurse | Admitting: Registered Nurse

## 2020-11-05 ENCOUNTER — Encounter: Payer: Self-pay | Admitting: Registered Nurse

## 2020-11-05 ENCOUNTER — Other Ambulatory Visit: Payer: Self-pay

## 2020-11-05 VITALS — HR 97 | Ht 67.0 in | Wt 211.0 lb

## 2020-11-05 DIAGNOSIS — G609 Hereditary and idiopathic neuropathy, unspecified: Secondary | ICD-10-CM | POA: Diagnosis not present

## 2020-11-05 DIAGNOSIS — Z79891 Long term (current) use of opiate analgesic: Secondary | ICD-10-CM | POA: Diagnosis not present

## 2020-11-05 DIAGNOSIS — M797 Fibromyalgia: Secondary | ICD-10-CM | POA: Diagnosis not present

## 2020-11-05 DIAGNOSIS — R569 Unspecified convulsions: Secondary | ICD-10-CM

## 2020-11-05 DIAGNOSIS — G8929 Other chronic pain: Secondary | ICD-10-CM | POA: Diagnosis not present

## 2020-11-05 DIAGNOSIS — G894 Chronic pain syndrome: Secondary | ICD-10-CM | POA: Diagnosis not present

## 2020-11-05 DIAGNOSIS — M62838 Other muscle spasm: Secondary | ICD-10-CM | POA: Diagnosis not present

## 2020-11-05 DIAGNOSIS — M7062 Trochanteric bursitis, left hip: Secondary | ICD-10-CM | POA: Diagnosis not present

## 2020-11-05 DIAGNOSIS — M546 Pain in thoracic spine: Secondary | ICD-10-CM

## 2020-11-05 MED ORDER — OXYCODONE-ACETAMINOPHEN 7.5-325 MG PO TABS: 1.0000 | ORAL_TABLET | Freq: Every day | ORAL | 0 refills | Status: DC | PRN

## 2020-11-05 NOTE — Progress Notes (Addendum)
Subjective:    Patient ID: Autumn Davis, female    DOB: 1986/08/02, 34 y.o.   MRN: [id number]  HPI: Autumn Davis is a 34 y.o. female whose appointment was changed to a My- Chart virtual office visit Autumn Davis reports she was diagnosed with COVID, her Parents and son was diagnosed with  COVID as well, she's currently on quarantine.  The virtual visit will also provide continuity of care. Autumn Davis agrees with My-Chart Visit and verbalizes understanding.  She states her pain is located in here mid- back and bilateral rib pain. Also reports generalized joint pain.She  rates her pain 4. Her current exercise regime is walking in her home short distances.  Autumn Davis Morphine equivalent is 56.25 MME.  Last Oral Swab was Performed on 05/11/2020, it was consistent Silas Sacramento CMA asked the Health and History Questions. This provider and Thurston Hole verified we were speaking with the correct person using two identifiers.   Pain Inventory Average Pain 7 Pain Right Now 4 My pain is sharp, burning and tingling  In the last 24 hours, has pain interfered with the following? General activity 4 Relation with others 4 Enjoyment of life 4 What TIME of day is your pain at its worst? evening and night Sleep (in general) Poor  Pain is worse with: sitting, standing and some activites Pain improves with: rest, heat/ice, therapy/exercise, pacing activities and medication Relief from Meds: 6  Family History  Problem Relation Age of Onset  . Thyroid disease Mother   . Colon polyps Father   . Lung cancer Maternal Grandmother   . Cancer Paternal Grandmother        colon/pancreatiec/lymphoma  . Irritable bowel syndrome Brother   . AAA (abdominal aortic aneurysm) Neg Hx    Social History   Socioeconomic History  . Marital status: Divorced    Spouse name: Not on file  . Number of children: Not on file  . Years of education: Not on file  . Highest education level: Not on file  Occupational History  .  Occupation: disabled    Employer: [employer]  Tobacco Use  . Smoking status: Never Smoker  . Smokeless tobacco: Never Used  Vaping Use  . Vaping Use: Never used  Substance and Sexual Activity  . Alcohol use: No    Alcohol/week: 0.0 standard drinks  . Drug use: No  . Sexual activity: Not on file  Other Topics Concern  . Not on file  Social History Narrative  . Not on file   Social Determinants of Health   Financial Resource Strain: Not on file  Food Insecurity: Not on file  Transportation Needs: Not on file  Physical Activity: Not on file  Stress: Not on file  Social Connections: Not on file   Past Surgical History:  Procedure Laterality Date  . 24 HOUR PH STUDY N/A 10/29/2015   Procedure: 24 HOUR PH STUDY;  Surgeon: Iva Boop, MD;  [facility];  Service: Endoscopy;  Laterality: N/A;  . COLONOSCOPY    . ESOPHAGEAL MANOMETRY N/A 10/29/2015   Procedure: ESOPHAGEAL MANOMETRY (EM);  Surgeon: Iva Boop, MD;  [facility];  Service: Endoscopy;  Laterality: N/A;  . KNEE SURGERY  2002/2003   bil  . RECTAL SURGERY  correction of prolapse   2010   Past Surgical History:  Procedure Laterality Date  . 24 HOUR PH STUDY N/A 10/29/2015   Procedure: 24 HOUR PH STUDY;  Surgeon: Iva Boop, MD;  Location:  [facility];  Service: Endoscopy;  Laterality: N/A;  . COLONOSCOPY    . ESOPHAGEAL MANOMETRY N/A 10/29/2015   Procedure: ESOPHAGEAL MANOMETRY (EM);  Surgeon: Iva Boop, MD;  [facility];  Service: Endoscopy;  Laterality: N/A;  . KNEE SURGERY  2002/2003   bil  . RECTAL SURGERY  correction of prolapse   2010   Past Medical History:  Diagnosis Date  . Allergy   . Arthritis    HANDS,HIPS,KNEES  . Bronchitis, chronic/intermittent 01/22/2012  . Depression 01/22/2012  . GERD (gastroesophageal reflux disease)   . Hyperlipidemia   . IBS (irritable bowel syndrome) 01/22/2012  . Lupus (HCC)   . Neuromuscular disorder (HCC)    . Osteoporosis   . Seizures (HCC)   . SOB (shortness of breath)   . Thyroid disease    Pulse 97 Comment: pt reported, virtual visit  Ht 5\' 7"  (1.702 m) Comment: pt reported, virtual visit  Wt 211 lb (95.7 kg) Comment: pt reported, virtual visit  SpO2 94% Comment: pt reported, virtual visit  BMI 33.05 kg/m   Opioid Risk Score:   Fall Risk Score:  `1  Depression screen PHQ 2/9  Depression screen [facility] 2/9 08/10/2020 05/11/2020 01/20/2020 10/25/2019 07/22/2019 05/12/2019 03/17/2019  Decreased Interest 0 0 0 0 0 1 0  Down, Depressed, Hopeless 0 0 0 0 0 1 0  PHQ - 2 Score 0 0 0 0 0 2 0  Altered sleeping 2 0 - - - - -  Tired, decreased energy 1 0 - - - - -  Change in appetite 1 0 - - - - -  Feeling bad or failure about yourself  0 0 - - - - -  Trouble concentrating 0 0 - - - - -  Moving slowly or fidgety/restless 0 0 - - - - -  Suicidal thoughts 0 0 - - - - -  PHQ-9 Score 4 0 - - - - -  Difficult doing work/chores - Not difficult at all - - - - -  Some recent data might be hidden   Review of Systems  Musculoskeletal: Positive for back pain.       Rib pain  All other systems reviewed and are negative.      Objective:   Physical Exam Vitals and nursing note reviewed.  Musculoskeletal:     Comments: No Physical Exam Performed: My Chart Video Visit           Assessment & Plan:  1.Chronic seizure disorder: No seizure's. Continuecurrent medication regimen withGabapentin. Neurology Following.11/05/2020. 2. Chronic muscle spasms, weakness with associated pain disorder: Continuecurrent medication regimen withBaclofen. Continue with Exercise regime.11/05/2020. 3. Chronic dysphagia:No complaints today. Continue to monitor.GI Following.11/05/2020. 4. Anxiety with depression : Stable. Continuecurrent medication regimen withElavil.11/05/2020. 5. Fibromyalgia/ Rib Pain/Chronic Pain:Continue Diclofenac: Continue with exercise and heat Therapy.11/05/2020. Refilled:  oxyCODONE 7.5/325mg  onetablet 5 times daily as needed #140. Second script sent for the following month.Continue with slow weaning. We will continue the opioid monitoring program, this consists of regular clinic visits, examinations, urine drug screen, pill counts as well as use of [place] Controlled Substance Reporting system. A 12 month History has been reviewed on the [place] Controlled Substance Reporting System12/20/2021. 6. Peripheral Neuropathy:Continuecurrent medication regimen withGabapentin:11/05/2020.  F/U in64months  My- Chart Video Visit Established Patient Location of Patient: In her Home Location of Provider: In the Office

## 2020-11-07 DIAGNOSIS — U071 COVID-19: Secondary | ICD-10-CM | POA: Diagnosis not present

## 2020-11-14 ENCOUNTER — Ambulatory Visit: Payer: Medicare Other | Admitting: Nurse Practitioner

## 2020-11-20 ENCOUNTER — Encounter: Payer: Self-pay | Admitting: Nurse Practitioner

## 2020-11-20 ENCOUNTER — Telehealth: Payer: Self-pay

## 2020-11-20 ENCOUNTER — Other Ambulatory Visit: Payer: Self-pay

## 2020-11-20 ENCOUNTER — Ambulatory Visit (INDEPENDENT_AMBULATORY_CARE_PROVIDER_SITE_OTHER): Payer: Medicare Other | Admitting: Nurse Practitioner

## 2020-11-20 VITALS — BP 127/85 | HR 87 | Temp 98.5°F | Resp 20 | Ht 67.0 in | Wt 215.0 lb

## 2020-11-20 DIAGNOSIS — F3342 Major depressive disorder, recurrent, in full remission: Secondary | ICD-10-CM

## 2020-11-20 DIAGNOSIS — K581 Irritable bowel syndrome with constipation: Secondary | ICD-10-CM

## 2020-11-20 DIAGNOSIS — R350 Frequency of micturition: Secondary | ICD-10-CM

## 2020-11-20 DIAGNOSIS — M329 Systemic lupus erythematosus, unspecified: Secondary | ICD-10-CM

## 2020-11-20 DIAGNOSIS — I479 Paroxysmal tachycardia, unspecified: Secondary | ICD-10-CM

## 2020-11-20 DIAGNOSIS — E079 Disorder of thyroid, unspecified: Secondary | ICD-10-CM

## 2020-11-20 DIAGNOSIS — Z6833 Body mass index (BMI) 33.0-33.9, adult: Secondary | ICD-10-CM

## 2020-11-20 DIAGNOSIS — E782 Mixed hyperlipidemia: Secondary | ICD-10-CM

## 2020-11-20 DIAGNOSIS — R569 Unspecified convulsions: Secondary | ICD-10-CM

## 2020-11-20 DIAGNOSIS — R1319 Other dysphagia: Secondary | ICD-10-CM

## 2020-11-20 DIAGNOSIS — E559 Vitamin D deficiency, unspecified: Secondary | ICD-10-CM

## 2020-11-20 DIAGNOSIS — K219 Gastro-esophageal reflux disease without esophagitis: Secondary | ICD-10-CM

## 2020-11-20 DIAGNOSIS — M62838 Other muscle spasm: Secondary | ICD-10-CM

## 2020-11-20 DIAGNOSIS — D8989 Other specified disorders involving the immune mechanism, not elsewhere classified: Secondary | ICD-10-CM

## 2020-11-20 MED ORDER — FUROSEMIDE 20 MG PO TABS: 20.0000 mg | ORAL_TABLET | Freq: Every day | ORAL | 1 refills | Status: DC

## 2020-11-20 MED ORDER — PREDNISONE 5 MG PO TABS: 5.0000 mg | ORAL_TABLET | Freq: Every morning | ORAL | 1 refills | Status: DC

## 2020-11-20 MED ORDER — AMITRIPTYLINE HCL 75 MG PO TABS
75.0000 mg | ORAL_TABLET | Freq: Every day | ORAL | 3 refills | Status: DC
Start: 2020-11-20 — End: 2021-05-24

## 2020-11-20 MED ORDER — ATORVASTATIN CALCIUM 40 MG PO TABS: ORAL_TABLET | ORAL | 1 refills | Status: DC

## 2020-11-20 MED ORDER — IPRATROPIUM-ALBUTEROL 0.5-2.5 (3) MG/3ML IN SOLN: 3.0000 mL | RESPIRATORY_TRACT | 4 refills | Status: DC | PRN

## 2020-11-20 MED ORDER — TAMSULOSIN HCL 0.4 MG PO CAPS
ORAL_CAPSULE | ORAL | 1 refills | Status: DC
Start: 2020-11-20 — End: 2021-02-18

## 2020-11-20 MED ORDER — BACLOFEN 20 MG PO TABS: ORAL_TABLET | ORAL | 1 refills | Status: DC

## 2020-11-20 MED ORDER — GABAPENTIN 600 MG PO TABS
ORAL_TABLET | ORAL | 1 refills | Status: DC
Start: 2020-11-20 — End: 2021-02-18

## 2020-11-20 MED ORDER — BUDESONIDE-FORMOTEROL FUMARATE 80-4.5 MCG/ACT IN AERO
2.0000 | INHALATION_SPRAY | Freq: Two times a day (BID) | RESPIRATORY_TRACT | 5 refills | Status: DC
Start: 2020-11-20 — End: 2020-12-11

## 2020-11-20 MED ORDER — METOPROLOL SUCCINATE ER 25 MG PO TB24: 25.0000 mg | ORAL_TABLET | Freq: Every day | ORAL | 1 refills | Status: DC

## 2020-11-20 MED ORDER — ACETAZOLAMIDE 250 MG PO TABS: ORAL_TABLET | ORAL | 1 refills | Status: DC

## 2020-11-20 MED ORDER — BUDESONIDE-FORMOTEROL FUMARATE 80-4.5 MCG/ACT IN AERO
2.0000 | INHALATION_SPRAY | Freq: Two times a day (BID) | RESPIRATORY_TRACT | 5 refills | Status: DC
Start: 2020-11-20 — End: 2020-11-20

## 2020-11-20 MED ORDER — MONTELUKAST SODIUM 10 MG PO TABS: 10.0000 mg | ORAL_TABLET | Freq: Every day | ORAL | 1 refills | Status: DC

## 2020-11-20 NOTE — Progress Notes (Signed)
Subjective:    Patient ID: Autumn Davis, female    DOB: 04-Dec-1985, 35 y.o.   MRN: [id number]   Chief Complaint: Medical Management of Chronic Issues    HPI:  1. Mixed hyperlipidemia Does try to watch diet and exercises when she can. Lab Results  Component Value Date   CHOL 205 (H) 08/10/2020   HDL 73 08/10/2020   LDLCALC 106 (H) 08/10/2020   TRIG 151 (H) 08/10/2020   CHOLHDL 2.8 08/10/2020     2. Esophageal dysphagia Has been good lately.is not consistent  3. Gastroesophageal reflux disease without esophagitis Takes over the counter meds if she feels any reflux symptoms. Does not flare up often  4. Irritable bowel syndrome with constipation Has been diarrhea for several months, but has gine back to constipation. She is back on colase.  5. Thyroid disease No problems that sh eis aware of. ' Lab Results  Component Value Date   TSH 2.530 08/10/2020     6. Recurrent major depressive disorder, in full remission [facility]) She is currently not on and antidepressant. Has been doing well since her divorse from her husband. Depression screen [facility] 2/9 11/20/2020 08/10/2020 05/11/2020  Decreased Interest 0 0 0  Down, Depressed, Hopeless 0 0 0  PHQ - 2 Score 0 0 0  Altered sleeping - 2 0  Tired, decreased energy - 1 0  Change in appetite - 1 0  Feeling bad or failure about yourself  - 0 0  Trouble concentrating - 0 0  Moving slowly or fidgety/restless - 0 0  Suicidal thoughts - 0 0  PHQ-9 Score - 4 0  Difficult doing work/chores - - Not difficult at all  Some recent data might be hidden     7. Inflammatory autoimmune disorder [facility]) She sees specialist, neurology, but they are still not real sure what she has. They currently just treat symptoms.  8. Systemic lupus erythematosus, unspecified SLE type, unspecified organ involvement status (HCC) Again not sure she really has lupus. Sometimes she test positive and other times she has tested negative.  9. 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 The valium helps with that. She has been able to increase her exercise and is no linger walking with cane or walker.  10. Seizures (HCC) Has not had a seizure in several years. She was able to get her drivers license back this past year.  11. Vitamin D deficiency Is on a daily vitamin d supplement.  12. BMI 330-33.9 Weight is up 4lbs Wt Readings from Last 3 Encounters:  11/20/20 215 lb (97.5 kg)  11/05/20 211 lb (95.7 kg)  09/11/20 211 lb 9.6 oz (96 kg)   BMI Readings from Last 3 Encounters:  11/20/20 33.67 kg/m  11/05/20 33.05 kg/m  09/11/20 33.14 kg/m         Outpatient Encounter Medications as of 11/20/2020  Medication Sig  . acetaZOLAMIDE (DIAMOX) 250 MG tablet TAKE 1 TABLET EVERY MORNING, TAKE 1 TABLET AT 2PM, AND TAKE 2 TABLETSAT BEDTIME  . amitriptyline (ELAVIL) 75 MG tablet Take 1 tablet (75 mg total) by mouth at bedtime.  Marland Kitchen atorvastatin (LIPITOR) 40 MG tablet TAKE ONE (1) TABLET EACH DAY  . baclofen (LIORESAL) 20 MG tablet TAKE ONE TABLET FOUR TIMES DAILY  . budesonide-formoterol (SYMBICORT) 80-4.5 MCG/ACT inhaler Inhale 2 puffs into the lungs 2 (two) times daily.  . Calcium-Magnesium-Vitamin D (CALCIUM 1200+D3 PO) Take 1 tablet by mouth daily.  . cetirizine (ZYRTEC) 10 MG tablet Take  10 mg by mouth daily.  . Cholecalciferol (VITAMIN D3) 50000 units CAPS Take 50,000 Units by mouth once a week.  . cyanocobalamin (,VITAMIN B-12,) 1000 MCG/ML injection INJECT IM EVERY 30 DAYS  . Cyanocobalamin (B12 FAST DISSOLVE PO) Take by mouth 2 (two) times daily.  . cycloSPORINE (RESTASIS) 0.05 % ophthalmic emulsion Place 1 drop into both eyes 2 (two) times daily.  . diclofenac (VOLTAREN) 75 MG EC tablet TAKE ONE TABLET BY MOUTH TWICE DAILY  . docusate sodium (COLACE) 100 MG capsule Take 100 mg by mouth 2 (two) times daily. PRN  . EPINEPHRINE 0.3 mg/0.3 mL IJ SOAJ injection USE AS DIRECTED  . furosemide (LASIX) 20 MG tablet Take 1 tablet (20  mg total) by mouth daily.  Marland Kitchen gabapentin (NEURONTIN) 600 MG tablet TAKE 1 TABLET 5 TIMES DAILY  . ipratropium-albuterol (DUONEB) 0.5-2.5 (3) MG/3ML SOLN Take 3 mLs by nebulization every 4 (four) hours as needed.  Marland Kitchen L-Methylfolate 15 MG TABS Take 1 tablet (15 mg total) by mouth daily.  . metoprolol succinate (TOPROL-XL) 25 MG 24 hr tablet Take 1 tablet (25 mg total) by mouth daily.  . montelukast (SINGULAIR) 10 MG tablet TAKE ONE TABLET DAILY AT BEDTIME  . mupirocin ointment (BACTROBAN) 2 % Apply 1 application topically 2 (two) times daily.  Marland Kitchen nystatin (MYCOSTATIN) 100000 UNIT/ML suspension Use as directed 5 mLs (500,000 Units total) in the mouth or throat 4 (four) times daily.  Marland Kitchen nystatin-triamcinolone ointment (MYCOLOG) Apply 1 application topically 2 (two) times daily.  . Omega 3 1000 MG CAPS Take 1 capsule by mouth daily.  Marland Kitchen oxyCODONE-acetaminophen (PERCOCET) 7.5-325 MG tablet Take 1 tablet by mouth 5 (five) times daily as needed for moderate pain. No More Than 5 a day.  . predniSONE (DELTASONE) 5 MG tablet Take 1 tablet (5 mg total) by mouth every morning.  . promethazine (PHENERGAN) 12.5 MG tablet Take 1 tablet (12.5 mg total) by mouth every 8 (eight) hours as needed for nausea or vomiting.  . QNASL 80 MCG/ACT AERS USE 2 SPRAYS IN EACH NOSTRIL DAILY  . tamsulosin (FLOMAX) 0.4 MG CAPS capsule TAKE ONE (1) CAPSULE EACH DAY  . VENTOLIN HFA 108 (90 Base) MCG/ACT inhaler USE 2 PUFFS EVERY 4 TO 6 HOURS AS NEEDED  . Vitamin D, Ergocalciferol, (DRISDOL) 1.25 MG (50000 UNIT) CAPS capsule 1 po on Monday and thursday  . vitamin E 180 MG (400 UNITS) capsule Take 1,000 Units by mouth daily.      Past Surgical History:  Procedure Laterality Date  . 24 HOUR PH STUDY N/A 10/29/2015   Procedure: 24 HOUR PH STUDY;  Surgeon: Iva Boop, MD;  Location: [facility];  Service: Endoscopy;  Laterality: N/A;  . COLONOSCOPY    . ESOPHAGEAL MANOMETRY N/A 10/29/2015   Procedure: ESOPHAGEAL MANOMETRY (EM);   Surgeon: Iva Boop, MD;  Location: [facility];  Service: Endoscopy;  Laterality: N/A;  . KNEE SURGERY  2002/2003   bil  . RECTAL SURGERY  correction of prolapse   2010    Family History  Problem Relation Age of Onset  . Thyroid disease Mother   . Colon polyps Father   . Lung cancer Maternal Grandmother   . Cancer Paternal Grandmother        colon/pancreatiec/lymphoma  . Irritable bowel syndrome Brother   . AAA (abdominal aortic aneurysm) Neg Hx     New complaints: None today  Social history: Lives with her parents with her son  Controlled substance contract: n/a- gets  from pain management    Review of Systems  Constitutional: Negative for diaphoresis.  Eyes: Negative for pain.  Respiratory: Positive for shortness of breath (occasional).   Cardiovascular: Negative for chest pain, palpitations and leg swelling.  Gastrointestinal: Negative for abdominal pain.  Endocrine: Negative for polydipsia.  Musculoskeletal: Positive for arthralgias and myalgias.  Skin: Negative for rash.  Neurological: Positive for dizziness and headaches. Negative for weakness.  Hematological: Does not bruise/bleed easily.  All other systems reviewed and are negative.      Objective:   Physical Exam Vitals and nursing note reviewed.  Constitutional:      General: She is not in acute distress.    Appearance: Normal appearance. She is well-developed and well-nourished.  HENT:     Head: Normocephalic.     Nose: Nose normal.     Mouth/Throat:     Mouth: Oropharynx is clear and moist.  Eyes:     Extraocular Movements: EOM normal.     Pupils: Pupils are equal, round, and reactive to light.  Neck:     Vascular: No carotid bruit or JVD.  Cardiovascular:     Rate and Rhythm: Normal rate and regular rhythm.     Pulses: Intact distal pulses.     Heart sounds: Normal heart sounds.  Pulmonary:     Effort: Pulmonary effort is normal. No respiratory distress.     Breath sounds: Normal  breath sounds. No wheezing or rales.  Chest:     Chest wall: No tenderness.  Abdominal:     General: Bowel sounds are normal. There is no distension or abdominal bruit. Aorta is normal.     Palpations: Abdomen is soft. There is no hepatomegaly, splenomegaly, mass or pulsatile mass.     Tenderness: There is no abdominal tenderness.  Musculoskeletal:        General: No edema. Normal range of motion.     Cervical back: Normal range of motion and neck supple.  Lymphadenopathy:     Cervical: No cervical adenopathy.  Skin:    General: Skin is warm and dry.  Neurological:     Mental Status: She is alert and oriented to person, place, and time.     Deep Tendon Reflexes: Reflexes are normal and symmetric.  Psychiatric:        Mood and Affect: Mood and affect normal.        Behavior: Behavior normal.        Thought Content: Thought content normal.        Judgment: Judgment normal.       BP 127/85   Pulse 87   Temp 98.5 F (36.9 C) (Temporal)   Resp 20   Ht 5\' 7"  (1.702 m)   Wt 215 lb (97.5 kg)   SpO2 97%   BMI 33.67 kg/m      Assessment & Plan:  Avina Coldren comes in today with chief complaint of Medical Management of Chronic Issues   Diagnosis and orders addressed:  1. Mixed hyperlipidemia Low fat diet - atorvastatin (LIPITOR) 40 MG tablet; TAKE ONE (1) TABLET EACH DAY  Dispense: 90 tablet; Refill: 1  2. Esophageal dysphagia Chew food really good  3. Gastroesophageal reflux disease without esophagitis Avoid spicy foods Do not eat 2 hours prior to bedtime  4. Irritable bowel syndrome with constipation - baclofen (LIORESAL) 20 MG tablet; TAKE ONE TABLET FOUR TIMES DAILY  Dispense: 360 each; Refill: 1 - amitriptyline (ELAVIL) 75 MG tablet; Take 1 tablet (75 mg total) by  mouth at bedtime.  Dispense: 90 tablet; Refill: 3  5. Thyroid disease Labs pending  6. Recurrent major depressive disorder, in full remission [facility]) Stress management  7. Inflammatory autoimmune  disorder (HCC) Keep follo wup with specialist - gabapentin (NEURONTIN) 600 MG tablet; TAKE 1 TABLET 5 TIMES DAILY  Dispense: 450 tablet; Refill: 1 - predniSONE (DELTASONE) 5 MG tablet; Take 1 tablet (5 mg total) by mouth every morning.  Dispense: 90 tablet; Refill: 1  8. Systemic lupus erythematosus, unspecified SLE type, unspecified organ involvement status (HCC)  9. Muscle spasticity Fall prevention  10. Seizures (HCC) - acetaZOLAMIDE (DIAMOX) 250 MG tablet; TAKE 1 TABLET EVERY MORNING, TAKE 1 TABLET AT 2PM, AND TAKE 2 TABLETSAT BEDTIME  Dispense: 360 tablet; Refill: 1  11. Vitamin D deficiency Continue daily vitamind suppleemnt  12. BMI 33.0-33.9,adult Discussed diet and exercise for person with BMI >25 Will recheck weight in 3-6 months  13. Tachycardia, paroxysmal (HCC) Avoid caffeine - metoprolol succinate (TOPROL-XL) 25 MG 24 hr tablet; Take 1 tablet (25 mg total) by mouth daily.  Dispense: 90 tablet; Refill: 1  15. Urinary frequency  tamsulosin (FLOMAX) 0.4 MG CAPS capsule; TAKE ONE (1) CAPSULE EACH DAY  Dispense: 90 capsule; Refill: 1   Labs pending Health Maintenance reviewed Diet and exercise encouraged  Follow up plan: 6 months   Mary-Margaret Daphine Deutscher, FNP

## 2020-11-20 NOTE — Patient Instructions (Signed)

## 2020-11-20 NOTE — Telephone Encounter (Signed)
Per Arlys John at [employer], the symbicort has to be sent with dosage 10.2 grams so insurance will cover.

## 2020-11-20 NOTE — Addendum Note (Signed)
Addended by: [name] on: 11/20/2020 01:05 PM   Modules accepted: Orders

## 2020-11-21 LAB — CBC WITH DIFFERENTIAL/PLATELET
Basophils Absolute: 0.1 10*3/uL (ref 0.0–0.2)
Basos: 1 %
EOS (ABSOLUTE): 0.3 10*3/uL (ref 0.0–0.4)
Eos: 3 %
Hematocrit: 40.2 % (ref 34.0–46.6)
Hemoglobin: 13.2 g/dL (ref 11.1–15.9)
Immature Grans (Abs): 0.1 10*3/uL (ref 0.0–0.1)
Immature Granulocytes: 1 %
Lymphocytes Absolute: 2.4 10*3/uL (ref 0.7–3.1)
Lymphs: 23 %
MCH: 29.7 pg (ref 26.6–33.0)
MCHC: 32.8 g/dL (ref 31.5–35.7)
MCV: 90 fL (ref 79–97)
Monocytes Absolute: 0.9 10*3/uL (ref 0.1–0.9)
Monocytes: 9 %
Neutrophils Absolute: 6.7 10*3/uL (ref 1.4–7.0)
Neutrophils: 63 %
Platelets: 308 10*3/uL (ref 150–450)
RBC: 4.45 x10E6/uL (ref 3.77–5.28)
RDW: 12.3 % (ref 11.7–15.4)
WBC: 10.5 10*3/uL (ref 3.4–10.8)

## 2020-11-21 LAB — LIPID PANEL
Chol/HDL Ratio: 3.6 ratio (ref 0.0–4.4)
Cholesterol, Total: 253 mg/dL — ABNORMAL HIGH (ref 100–199)
HDL: 70 mg/dL (ref >39)
LDL Chol Calc (NIH): 132 mg/dL — ABNORMAL HIGH (ref 0–99)
Triglycerides: 291 mg/dL — ABNORMAL HIGH (ref 0–149)
VLDL Cholesterol Cal: 51 mg/dL — ABNORMAL HIGH (ref 5–40)

## 2020-11-21 LAB — CMP14+EGFR
ALT: 20 IU/L (ref 0–32)
AST: 18 IU/L (ref 0–40)
Albumin/Globulin Ratio: 2 (ref 1.2–2.2)
Albumin: 4.4 g/dL (ref 3.8–4.8)
Alkaline Phosphatase: 93 IU/L (ref 44–121)
BUN/Creatinine Ratio: 12 (ref 9–23)
BUN: 9 mg/dL (ref 6–20)
Bilirubin Total: 0.3 mg/dL (ref 0.0–1.2)
CO2: 23 mmol/L (ref 20–29)
Calcium: 9.3 mg/dL (ref 8.7–10.2)
Chloride: 107 mmol/L — ABNORMAL HIGH (ref 96–106)
Creatinine, Ser: 0.75 mg/dL (ref 0.57–1.00)
GFR calc Af Amer: 119 mL/min/{1.73_m2} (ref >59)
GFR calc non Af Amer: 103 mL/min/{1.73_m2} (ref >59)
Globulin, Total: 2.2 g/dL (ref 1.5–4.5)
Glucose: 102 mg/dL — ABNORMAL HIGH (ref 65–99)
Potassium: 3.7 mmol/L (ref 3.5–5.2)
Sodium: 142 mmol/L (ref 134–144)
Total Protein: 6.6 g/dL (ref 6.0–8.5)

## 2020-11-21 LAB — THYROID PANEL WITH TSH
Free Thyroxine Index: 1.7 (ref 1.2–4.9)
T3 Uptake Ratio: 26 % (ref 24–39)
T4, Total: 6.6 ug/dL (ref 4.5–12.0)
TSH: 3.1 u[IU]/mL (ref 0.450–4.500)

## 2020-11-26 ENCOUNTER — Ambulatory Visit (INDEPENDENT_AMBULATORY_CARE_PROVIDER_SITE_OTHER): Payer: Medicare Other

## 2020-11-26 ENCOUNTER — Other Ambulatory Visit: Payer: Self-pay

## 2020-11-26 DIAGNOSIS — Z23 Encounter for immunization: Secondary | ICD-10-CM

## 2020-12-07 ENCOUNTER — Other Ambulatory Visit: Payer: Self-pay | Admitting: Nurse Practitioner

## 2020-12-07 DIAGNOSIS — K219 Gastro-esophageal reflux disease without esophagitis: Secondary | ICD-10-CM

## 2020-12-11 ENCOUNTER — Encounter: Payer: Self-pay | Admitting: Primary Care

## 2020-12-11 ENCOUNTER — Ambulatory Visit (INDEPENDENT_AMBULATORY_CARE_PROVIDER_SITE_OTHER): Payer: Self-pay | Admitting: Primary Care

## 2020-12-11 ENCOUNTER — Ambulatory Visit (INDEPENDENT_AMBULATORY_CARE_PROVIDER_SITE_OTHER): Payer: Self-pay

## 2020-12-11 ENCOUNTER — Other Ambulatory Visit: Payer: Self-pay | Admitting: Nurse Practitioner

## 2020-12-11 ENCOUNTER — Other Ambulatory Visit: Payer: Self-pay

## 2020-12-11 VITALS — BP 120/80 | HR 83 | Ht 67.0 in | Wt 218.6 lb

## 2020-12-11 DIAGNOSIS — U099 Post covid-19 condition, unspecified: Secondary | ICD-10-CM | POA: Insufficient documentation

## 2020-12-11 DIAGNOSIS — R0602 Shortness of breath: Secondary | ICD-10-CM

## 2020-12-11 DIAGNOSIS — U071 COVID-19: Secondary | ICD-10-CM

## 2020-12-11 LAB — BASIC METABOLIC PANEL
BUN: 14 mg/dL (ref 6–23)
CO2: 27 mEq/L (ref 19–32)
Calcium: 9.2 mg/dL (ref 8.4–10.5)
Chloride: 107 mEq/L (ref 96–112)
Creatinine, Ser: 0.8 mg/dL (ref 0.40–1.20)
GFR: 94.47 mL/min (ref >60.00)
Glucose, Bld: 90 mg/dL (ref 70–99)
Potassium: 3.3 mEq/L — ABNORMAL LOW (ref 3.5–5.1)
Sodium: 141 mEq/L (ref 135–145)

## 2020-12-11 LAB — CBC
HCT: 39.8 % (ref 36.0–46.0)
Hemoglobin: 13.1 g/dL (ref 12.0–15.0)
MCHC: 32.9 g/dL (ref 30.0–36.0)
MCV: 89.2 fl (ref 78.0–100.0)
Platelets: 319 10*3/uL (ref 150.0–400.0)
RBC: 4.46 Mil/uL (ref 3.87–5.11)
RDW: 13.3 % (ref 11.5–15.5)
WBC: 13.1 10*3/uL — ABNORMAL HIGH (ref 4.0–10.5)

## 2020-12-11 LAB — D-DIMER, QUANTITATIVE: D-Dimer, Quant: <0.19 mcg/mL FEU (ref <0.50)

## 2020-12-11 MED ORDER — BUDESONIDE-FORMOTEROL FUMARATE 160-4.5 MCG/ACT IN AERO
2.0000 | INHALATION_SPRAY | Freq: Two times a day (BID) | RESPIRATORY_TRACT | 6 refills | Status: DC
Start: 2020-12-11 — End: 2021-07-11

## 2020-12-11 NOTE — Assessment & Plan Note (Signed)
Unvaccinated individual. Patient was diagnosed with Covid in December 2021. She was not hospitalized, received outpatient monoclonal antibody infusion. Reports residual fatigue and difficulty taking deep breath. She has hx of asthma which was well controlled prior to getting covid. She improved a little while on 40mg  prednisone x 1 week. We will increase Symbicort to 160 two puffs twice a day. Recommend checking CXR today along with D-dimer. If abnormal may need further CT imaging. Advised patient to use Incentive spirometer 5-10 breaths every hour while awake. If testing normal would recommend patient be referred to post covid-19 rehab to help with endurance and would repeat PFTs in 8-12 weeks. Strongly encourage patient consider getting vaccinated 90 days after illness.   Orders: - CXR and labs today - Incentive spirometer - PFTs   Follow-up: - 6-8 weeks with Dr. 10-12 (or [facility] NP if he has not availability)

## 2020-12-11 NOTE — Progress Notes (Signed)
Please let patient know her D-dimer was negative and cxr showed no acute disease. Low lung vol is likely related to known asthma. She did have coarsened interstitial lung markings, recommend HRCT to better evaluate d/t recent covid infection to rule out covid fibrosis. Her WBC was elevated and this is likely from recent and chronic prednisone use. Potassium was borderline low, recommend eating foods high in potassium the next several days such as green leafy vegetables, potatos, spinach, melon, black beans, beets, bananas etc

## 2020-12-11 NOTE — Patient Instructions (Addendum)
Recommendations: - We will send in a new prescription for Symbicort 160 - take two puffs twice daily (rinse mouth after use) - Use incentive spirometer 5-10 breaths every hours while awake - Please consider getting vaccine 90 days after covid infection - Checking CXR and d-dimer- if abnormal may need CT imaging  - Recommend repeating PFTs in 8-12 weeks  - May refer you to rehab program if testing is normal to work on endurance  Orders: - CXR and labs today - Incentive spirometer - PFTs   Follow-up: - 6-8 weeks with Dr. Craige Cotta (or [facility] NP if he has not availability)

## 2020-12-11 NOTE — Progress Notes (Signed)
@Patient  ID: Autumn Davis, female    DOB: 03-Jan-1986, 35 y.o.   MRN: [id number]  Chief Complaint  Patient presents with  . Follow-up    Pt was diagnosed with Covid December 2021. Pt states she does not have complaints of a cough unless she is not able to catch her breath. Pt has been having complaints of not able to get a deep breath, has been using nebulizer 3-4 times daily, and using her rescue inhaler more often. Pt also has had complaints of pain in her back and also chest discomfort.l    Referring provider: [name], *  HPI: 35 year old female, never smoked. PMH significant for moderate persistent asthma, GERD, paroxysmal tachycardia, IBS, dysphagia, thyroid disease, fibromyalgia, Lupus, seizures, vit D deficiency. Patient of Dr. Craige Cotta, last seen for video visit on 10/17/19. Maintained on Symbicort 80, Singulair and prn Albuterol.  12/11/2020 Patient presents today for regular follow-up/post covid. Dx with covid in December 2021.  Prior to getting covid her asthma was well controlled.  She received monoclonal antibody treatment in [place]. She has residual fatigue and difficulty taking deep breath. Associated back pain. She has been needing to use albuterol nebulizer 3-4 times a day. She has been on chronic prednisone 5mg  daily. Primary care increased steroid dose to 40mg  last week. She states that this did helped some. She is tired by simple tasks. She did loss her smell and taste. Her smell is 100% back and taste is is slowly returning. States that it helped some.   Pulmonary testing: PFT 05/05/14 >> FEV1 1.60 (44%), FEV1% 69, TLC 3.97 (70%), difficulty with test maneuvers HRCT chest 07/21/14 >> normal PFT 01/05/19 >> FEV1 2.76 (78%), FEV1% 80, TLC 3.95 (69%), DLCO 86%, + BD  Allergies  Allergen Reactions  . Ciprofloxacin Other (See Comments)    Muscle weakness and numbness  . Compazine Other (See Comments)    hallucinations  . Prochlorperazine Other (See Comments) and Rash     Pt states it makes her feel like things are crawling on her    Immunization History  Administered Date(s) Administered  . Influenza Split 08/27/2012  . Influenza,inj,Quad PF,6+ Mos 08/29/2013, 08/31/2014, 09/27/2015, 09/12/2016, 09/23/2017, 09/21/2018, 09/26/2019, 11/26/2020  . Pneumococcal Conjugate-13 08/31/2014  . Pneumococcal Polysaccharide-23 08/29/2013  . Tdap 02/23/2015    Past Medical History:  Diagnosis Date  . Allergy   . Arthritis    HANDS,HIPS,KNEES  . Bronchitis, chronic/intermittent 01/22/2012  . Depression 01/22/2012  . GERD (gastroesophageal reflux disease)   . Hyperlipidemia   . IBS (irritable bowel syndrome) 01/22/2012  . Lupus (HCC)   . Neuromuscular disorder (HCC)   . Osteoporosis   . Seizures (HCC)   . SOB (shortness of breath)   . Thyroid disease     Tobacco History: Social History   Tobacco Use  Smoking Status Never Smoker  Smokeless Tobacco Never Used   Counseling given: Not Answered   Outpatient Medications Prior to Visit  Medication Sig Dispense Refill  . acetaZOLAMIDE (DIAMOX) 250 MG tablet TAKE 1 TABLET EVERY MORNING, TAKE 1 TABLET AT 2PM, AND TAKE 2 TABLETSAT BEDTIME 360 tablet 1  . amitriptyline (ELAVIL) 75 MG tablet Take 1 tablet (75 mg total) by mouth at bedtime. 90 tablet 3  . Ascorbic Acid (VITAMIN C ADULT GUMMIES PO) Take 2 each by mouth daily.    Marland Kitchen atorvastatin (LIPITOR) 40 MG tablet TAKE ONE (1) TABLET EACH DAY 90 tablet 1  . baclofen (LIORESAL) 20 MG tablet TAKE ONE TABLET FOUR  TIMES DAILY 360 each 1  . Calcium-Magnesium-Vitamin D (CALCIUM 1200+D3 PO) Take 1 tablet by mouth daily.    . cetirizine (ZYRTEC) 10 MG tablet Take 10 mg by mouth daily.    . Cholecalciferol (VITAMIN D3) 50000 units CAPS Take 50,000 Units by mouth once a week.    . cyanocobalamin (,VITAMIN B-12,) 1000 MCG/ML injection INJECT IM EVERY 30 DAYS 3 mL 1  . Cyanocobalamin (B12 FAST DISSOLVE PO) Take by mouth 2 (two) times daily.    . cycloSPORINE (RESTASIS)  0.05 % ophthalmic emulsion Place 1 drop into both eyes 2 (two) times daily.    . diclofenac (VOLTAREN) 75 MG EC tablet TAKE ONE TABLET BY MOUTH TWICE DAILY 180 tablet 0  . docusate sodium (COLACE) 100 MG capsule Take 100 mg by mouth 2 (two) times daily. PRN    . EPINEPHRINE 0.3 mg/0.3 mL IJ SOAJ injection USE AS DIRECTED 2 Device 2  . furosemide (LASIX) 20 MG tablet Take 1 tablet (20 mg total) by mouth daily. 90 tablet 1  . gabapentin (NEURONTIN) 600 MG tablet TAKE 1 TABLET 5 TIMES DAILY 450 tablet 1  . ipratropium-albuterol (DUONEB) 0.5-2.5 (3) MG/3ML SOLN Take 3 mLs by nebulization every 4 (four) hours as needed. 360 mL 4  . L-Methylfolate 15 MG TABS Take 1 tablet (15 mg total) by mouth daily. 90 tablet 1  . metoprolol succinate (TOPROL-XL) 25 MG 24 hr tablet Take 1 tablet (25 mg total) by mouth daily. 90 tablet 1  . montelukast (SINGULAIR) 10 MG tablet Take 1 tablet (10 mg total) by mouth at bedtime. 90 tablet 1  . Multiple Vitamins-Minerals (ZINC PO) Take 1 tablet by mouth daily.    . mupirocin ointment (BACTROBAN) 2 % Apply 1 application topically 2 (two) times daily. 22 g 0  . nystatin-triamcinolone ointment (MYCOLOG) Apply 1 application topically 2 (two) times daily. 30 g 0  . Omega 3 1000 MG CAPS Take 1 capsule by mouth daily.    Marland Kitchen oxyCODONE-acetaminophen (PERCOCET) 7.5-325 MG tablet Take 1 tablet by mouth 5 (five) times daily as needed for moderate pain. No More Than 5 a day. 140 tablet 0  . predniSONE (DELTASONE) 5 MG tablet Take 1 tablet (5 mg total) by mouth every morning. 90 tablet 1  . promethazine (PHENERGAN) 12.5 MG tablet Take 1 tablet (12.5 mg total) by mouth every 8 (eight) hours as needed for nausea or vomiting. 30 tablet 0  . QNASL 80 MCG/ACT AERS USE 2 SPRAYS IN EACH NOSTRIL DAILY 10.6 g 1  . tamsulosin (FLOMAX) 0.4 MG CAPS capsule TAKE ONE (1) CAPSULE EACH DAY 90 capsule 1  . VENTOLIN HFA 108 (90 Base) MCG/ACT inhaler USE 2 PUFFS EVERY 4 TO 6 HOURS AS NEEDED 18 g 3  .  Vitamin D, Ergocalciferol, (DRISDOL) 1.25 MG (50000 UNIT) CAPS capsule 1 po on Monday and thursday 10 capsule 5  . vitamin E 180 MG (400 UNITS) capsule Take 1,000 Units by mouth daily.     . budesonide-formoterol (SYMBICORT) 80-4.5 MCG/ACT inhaler Inhale 2 puffs into the lungs 2 (two) times daily. 10.2 g 5  . nystatin (MYCOSTATIN) 100000 UNIT/ML suspension Use as directed 5 mLs (500,000 Units total) in the mouth or throat 4 (four) times daily. (Patient not taking: Reported on 12/11/2020) 60 mL 2   No facility-administered medications prior to visit.    Review of Systems  Review of Systems  Constitutional: Positive for fatigue.  Respiratory: Positive for shortness of breath. Negative for chest tightness  and wheezing.     Physical Exam  BP 120/80 (BP Location: Left Arm, Cuff Size: Large)   Pulse 83   Ht 5\' 7"  (1.702 m)   Wt 218 lb 9.6 oz (99.2 kg)   SpO2 98%   BMI 34.24 kg/m  Physical Exam Constitutional:      Appearance: Normal appearance.  Cardiovascular:     Rate and Rhythm: Normal rate and regular rhythm.  Pulmonary:     Effort: Pulmonary effort is normal.     Comments: CTA, poor effort Neurological:     General: No focal deficit present.     Mental Status: She is alert and oriented to person, place, and time. Mental status is at baseline.  Psychiatric:        Mood and Affect: Mood normal.        Behavior: Behavior normal.        Thought Content: Thought content normal.        Judgment: Judgment normal.      Lab Results:  CBC    Component Value Date/Time   WBC 10.5 11/20/2020 1319   WBC 9.3 02/13/2014 1637   WBC 9.8 01/10/2013 2317   RBC 4.45 11/20/2020 1319   RBC 4.7 02/13/2014 1637   RBC 4.03 01/10/2013 2317   HGB 13.2 11/20/2020 1319   HCT 40.2 11/20/2020 1319   PLT 308 11/20/2020 1319   MCV 90 11/20/2020 1319   MCH 29.7 11/20/2020 1319   MCH 28.6 02/13/2014 1637   MCH 30.0 01/10/2013 2317   MCHC 32.8 11/20/2020 1319   MCHC 32.3 02/13/2014 1637    MCHC 34.2 01/10/2013 2317   RDW 12.3 11/20/2020 1319   LYMPHSABS 2.4 11/20/2020 1319   MONOABS 0.6 01/10/2013 2317   EOSABS 0.3 11/20/2020 1319   BASOSABS 0.1 11/20/2020 1319    BMET    Component Value Date/Time   NA 142 11/20/2020 1319   K 3.7 11/20/2020 1319   CL 107 (H) 11/20/2020 1319   CO2 23 11/20/2020 1319   GLUCOSE 102 (H) 11/20/2020 1319   GLUCOSE 98 05/25/2013 0859   BUN 9 11/20/2020 1319   CREATININE 0.75 11/20/2020 1319   CREATININE 0.79 05/25/2013 0859   CALCIUM 9.3 11/20/2020 1319   GFRNONAA 103 11/20/2020 1319   GFRNONAA >89 05/25/2013 0859   GFRAA 119 11/20/2020 1319   GFRAA >89 05/25/2013 0859    BNP No results found for: BNP  ProBNP No results found for: PROBNP  Imaging: No results found.   Assessment & Plan:   Post covid-19 condition, unspecified Unvaccinated individual. Patient was diagnosed with Covid in December 2021. She was not hospitalized, received outpatient monoclonal antibody infusion. Reports residual fatigue and difficulty taking deep breath. She has hx of asthma which was well controlled prior to getting covid. She improved a little while on 40mg  prednisone x 1 week. We will increase Symbicort to 160 two puffs twice a day. Recommend checking CXR today along with D-dimer. If abnormal may need further CT imaging. Advised patient to use Incentive spirometer 5-10 breaths every hour while awake. If testing normal would recommend patient be referred to post covid-19 rehab to help with endurance and would repeat PFTs in 8-12 weeks. Strongly encourage patient consider getting vaccinated 90 days after illness.   Orders: - CXR and labs today - Incentive spirometer - PFTs   Follow-up: - 6-8 weeks with Dr. Craige Cotta (or [facility] NP if he has not availability)     Glenford Bayley, NP 12/11/2020

## 2020-12-12 ENCOUNTER — Other Ambulatory Visit: Payer: Self-pay | Admitting: *Deleted

## 2020-12-12 ENCOUNTER — Telehealth: Payer: Self-pay | Admitting: Primary Care

## 2020-12-12 DIAGNOSIS — U071 COVID-19: Secondary | ICD-10-CM

## 2020-12-12 NOTE — Progress Notes (Signed)
Called and spoke with patient, advised of results and recommendations per Ames Dura NP.  She verbalized understanding.  HRCT scan ordered.  Advised that one of the PCCs will call to schedule.  Nothing further needed.

## 2020-12-12 NOTE — Progress Notes (Signed)
Reviewed and agree with assessment/plan.   Coralyn Helling, MD [facility] Pulmonary/Critical Care 12/12/2020, 8:38 AM Pager:  [phone]

## 2020-12-12 NOTE — Telephone Encounter (Signed)
See results note. 

## 2020-12-17 ENCOUNTER — Ambulatory Visit: Payer: Medicare Other

## 2020-12-25 ENCOUNTER — Ambulatory Visit
Admission: RE | Admit: 2020-12-25 | Discharge: 2020-12-25 | Disposition: A | Discharge status: Home / Self Care | Payer: Medicare Other | Source: Ambulatory Visit | Attending: Primary Care | Admitting: Primary Care

## 2020-12-25 ENCOUNTER — Other Ambulatory Visit: Payer: Self-pay

## 2020-12-25 DIAGNOSIS — J984 Other disorders of lung: Secondary | ICD-10-CM | POA: Diagnosis not present

## 2020-12-25 DIAGNOSIS — R918 Other nonspecific abnormal finding of lung field: Secondary | ICD-10-CM | POA: Diagnosis not present

## 2020-12-25 DIAGNOSIS — U071 COVID-19: Secondary | ICD-10-CM

## 2020-12-31 ENCOUNTER — Telehealth: Payer: Self-pay | Admitting: Primary Care

## 2020-12-31 NOTE — Telephone Encounter (Signed)
I just sent it to you actually, her CT chest is not concerning. Nothing that would explain her symptoms. Is she using Symbicort twice a day? Does she have an IS? Can we get her an apt with whom ever her primary pulmonologist is?

## 2020-12-31 NOTE — Telephone Encounter (Signed)
Called and spoke with pt who is requesting to know the results of her recent CT. Pt also stated that she has been having complaints of SOB and also is coughing. Pt states she woke up with chest discomfort and also states that it hurts when she breathes.  Pt states that she tries not to use her rescue inhaler due to using her nebulizer but states recently she has had to use her rescue inhaler at least twice. Pt states she has been using her nebulizer 3-4 times every day.  Pt denies any complaints of fever, has not checked temp but does not feel feverish.  Pt states if she has a coughing spell, she will take the tessalon and also states when she usually has some problems with the cough it is time for her to do another neb treatment.  Pt wants recommendations for symptoms and also results of her CT. Beth, please advise.

## 2020-12-31 NOTE — Telephone Encounter (Signed)
Results of pt's CT: Glenford Bayley, NP  Holston Oyama, Farley Ly, CMA Please let patient know her CT chest showed no evidence of fibrosis. Minimal scarring lung bases. Incidental finding tiny nonobstructive left kidney stone    Pt's primary pulmonologist is Dr. Craige Cotta and he does not have any openings anytime soon.   Called and spoke with pt in regards to the results of CT and she verbalized understanding.  Asked pt the new info provided by [facility].  Pt is using the Symbicort twice a day doing 2 puffs in morning and 2 puffs at night as directed. Pt stated that she does have an IS that she has been using. Pt states that she tries to use it every hour doing it about 4-5 times each when she uses it.

## 2020-12-31 NOTE — Telephone Encounter (Signed)
Patient is scheduled for PFT and ov with Beth on 01/25/2021.  Sending to beth as an [place].

## 2020-12-31 NOTE — Progress Notes (Signed)
Please let patient know her CT chest showed no evidence of fibrosis. Minimal scarring lung bases. Incidental finding tiny nonobstructive left kidney stone

## 2020-12-31 NOTE — Telephone Encounter (Signed)
Please see if you can make an apt on March 15th with him

## 2020-12-31 NOTE — Telephone Encounter (Signed)
appt scheduled with Dr. Craige Cotta on 01/29/2021. OV canceled for 01/25/2021. Patient will keep scheduled PFT 01/25/2021. Nothing further needed at this time.

## 2020-12-31 NOTE — Telephone Encounter (Signed)
Can she do PFTs on the 11th and see him on the 15th. I would prefer she see him if she is still having symptoms.

## 2021-01-14 ENCOUNTER — Other Ambulatory Visit: Payer: Self-pay

## 2021-01-14 ENCOUNTER — Encounter: Payer: Self-pay | Admitting: Registered Nurse

## 2021-01-14 ENCOUNTER — Encounter: Payer: Medicare Other | Attending: Registered Nurse | Admitting: Registered Nurse

## 2021-01-14 VITALS — BP 126/85 | HR 78 | Temp 98.3°F | Ht 67.0 in | Wt 222.6 lb

## 2021-01-14 DIAGNOSIS — G8929 Other chronic pain: Secondary | ICD-10-CM | POA: Insufficient documentation

## 2021-01-14 DIAGNOSIS — Z5181 Encounter for therapeutic drug level monitoring: Secondary | ICD-10-CM

## 2021-01-14 DIAGNOSIS — M546 Pain in thoracic spine: Secondary | ICD-10-CM | POA: Diagnosis not present

## 2021-01-14 DIAGNOSIS — R569 Unspecified convulsions: Secondary | ICD-10-CM

## 2021-01-14 DIAGNOSIS — M797 Fibromyalgia: Secondary | ICD-10-CM

## 2021-01-14 DIAGNOSIS — M62838 Other muscle spasm: Secondary | ICD-10-CM | POA: Insufficient documentation

## 2021-01-14 DIAGNOSIS — Z79891 Long term (current) use of opiate analgesic: Secondary | ICD-10-CM | POA: Diagnosis not present

## 2021-01-14 DIAGNOSIS — G894 Chronic pain syndrome: Secondary | ICD-10-CM | POA: Diagnosis not present

## 2021-01-14 MED ORDER — OXYCODONE-ACETAMINOPHEN 7.5-325 MG PO TABS: 1.0000 | ORAL_TABLET | Freq: Every day | ORAL | 0 refills | Status: DC | PRN

## 2021-01-14 NOTE — Progress Notes (Signed)
Subjective:    Patient ID: Autumn Davis, female    DOB: 1986/01/25, 35 y.o.   MRN: [id number]  HPI: Autumn Davis is a 35 y.o. female who returns for follow up appointment for chronic pain and medication refill. She states her  pain is located in her mid- back ( rib pain), She rates her pain 7. Her current exercise regime is walking and performing stretching exercises.  [name] Morphine equivalent is 56.25 MME.  UDS ordered today.    Pain Inventory Average Pain 8 Pain Right Now 7 My pain is intermittent, constant, sharp, burning, stabbing and tingling  In the last 24 hours, has pain interfered with the following? General activity 7 Relation with others 7 Enjoyment of life 7 What TIME of day is your pain at its worst? night Sleep (in general) Poor  Pain is worse with: walking, bending, sitting, inactivity, standing and some activites Pain improves with: rest, heat/ice, therapy/exercise, pacing activities, medication and TENS Relief from Meds: 5  Family History  Problem Relation Age of Onset  . Thyroid disease Mother   . Colon polyps Father   . Lung cancer Maternal Grandmother   . Cancer Paternal Grandmother        colon/pancreatiec/lymphoma  . Irritable bowel syndrome Brother   . AAA (abdominal aortic aneurysm) Neg Hx    Social History   Socioeconomic History  . Marital status: Divorced    Spouse name: Not on file  . Number of children: Not on file  . Years of education: Not on file  . Highest education level: Not on file  Occupational History  . Occupation: disabled    Employer: [employer]  Tobacco Use  . Smoking status: Never Smoker  . Smokeless tobacco: Never Used  Vaping Use  . Vaping Use: Never used  Substance and Sexual Activity  . Alcohol use: No    Alcohol/week: 0.0 standard drinks  . Drug use: No  . Sexual activity: Not on file  Other Topics Concern  . Not on file  Social History Narrative  . Not on file   Social Determinants of  Health   Financial Resource Strain: Not on file  Food Insecurity: Not on file  Transportation Needs: Not on file  Physical Activity: Not on file  Stress: Not on file  Social Connections: Not on file   Past Surgical History:  Procedure Laterality Date  . 24 HOUR PH STUDY N/A 10/29/2015   Procedure: 24 HOUR PH STUDY;  Surgeon: Iva Boop, MD;  Location: [facility];  Service: Endoscopy;  Laterality: N/A;  . COLONOSCOPY    . ESOPHAGEAL MANOMETRY N/A 10/29/2015   Procedure: ESOPHAGEAL MANOMETRY (EM);  Surgeon: Iva Boop, MD;  Location: [facility];  Service: Endoscopy;  Laterality: N/A;  . KNEE SURGERY  2002/2003   bil  . RECTAL SURGERY  correction of prolapse   2010   Past Surgical History:  Procedure Laterality Date  . 24 HOUR PH STUDY N/A 10/29/2015   Procedure: 24 HOUR PH STUDY;  Surgeon: Iva Boop, MD;  Location: [facility];  Service: Endoscopy;  Laterality: N/A;  . COLONOSCOPY    . ESOPHAGEAL MANOMETRY N/A 10/29/2015   Procedure: ESOPHAGEAL MANOMETRY (EM);  Surgeon: Iva Boop, MD;  Location: [facility];  Service: Endoscopy;  Laterality: N/A;  . KNEE SURGERY  2002/2003   bil  . RECTAL SURGERY  correction of prolapse   2010   Past Medical History:  Diagnosis Date  . Allergy   .  Arthritis    HANDS,HIPS,KNEES  . Bronchitis, chronic/intermittent 01/22/2012  . Depression 01/22/2012  . GERD (gastroesophageal reflux disease)   . Hyperlipidemia   . IBS (irritable bowel syndrome) 01/22/2012  . Lupus (HCC)   . Neuromuscular disorder (HCC)   . Osteoporosis   . Seizures (HCC)   . SOB (shortness of breath)   . Thyroid disease    There were no vitals taken for this visit.  Opioid Risk Score:   Fall Risk Score:  `1  Depression screen PHQ 2/9  Depression screen [facility] 2/9 11/20/2020 08/10/2020 05/11/2020 01/20/2020 10/25/2019 07/22/2019 05/12/2019  Decreased Interest 0 0 0 0 0 0 1  Down, Depressed, Hopeless 0 0 0 0 0 0 1  PHQ - 2 Score 0 0 0 0 0 0 2  Altered  sleeping - 2 0 - - - -  Tired, decreased energy - 1 0 - - - -  Change in appetite - 1 0 - - - -  Feeling bad or failure about yourself  - 0 0 - - - -  Trouble concentrating - 0 0 - - - -  Moving slowly or fidgety/restless - 0 0 - - - -  Suicidal thoughts - 0 0 - - - -  PHQ-9 Score - 4 0 - - - -  Difficult doing work/chores - - Not difficult at all - - - -  Some recent data might be hidden   Review of Systems  Musculoskeletal: Positive for back pain.       Pain in ribs, lower legs, feet, hands  All other systems reviewed and are negative.      Objective:   Physical Exam Vitals and nursing note reviewed.  Constitutional:      Appearance: Normal appearance.  Cardiovascular:     Rate and Rhythm: Normal rate and regular rhythm.     Pulses: Normal pulses.     Heart sounds: Normal heart sounds.  Pulmonary:     Effort: Pulmonary effort is normal.     Breath sounds: Normal breath sounds.  Musculoskeletal:     Cervical back: Normal range of motion and neck supple.     Comments: Normal Muscle Bulk and Muscle Testing Reveals:  Upper Extremities: Full ROM and Muscle Strength 5/5 Thoracic Paraspinal Tenderness: T-7-T-9  Lower Extremities: Full ROM and Muscle Strength 5/5 Arises from chair with ease Narrow Based  Gait   Skin:    General: Skin is warm and dry.  Neurological:     Mental Status: She is alert and oriented to person, place, and time.  Psychiatric:        Mood and Affect: Mood normal.        Behavior: Behavior normal.           Assessment & Plan:  1.Chronic seizure disorder: No seizure's. Continuecurrent medication regimen withGabapentin. Neurology Following.01/14/2021. 2. Chronic muscle spasms, weakness with associated pain disorder: Continuecurrent medication regimen withBaclofen. Continue with Exercise regime.01/14/2021. 3. Chronic dysphagia:No complaints today. Continue to monitor.GI Following.01/14/2021. 4. Anxiety with depression : No complaints  today. Stable. Continuecurrent medication regimen withElavil.01/14/2027. 5. Fibromyalgia/ Rib Pain/Chronic Pain:Continue Diclofenac: Continue with exercise and heat Therapy.01/14/2021. Refilled: oxyCODONE 7.5/325mg  onetablet 5 times daily as needed #140. Second script sent for the following month.Continue with slow weaning. We will continue the opioid monitoring program, this consists of regular clinic visits, examinations, urine drug screen, pill counts as well as use of [place] Controlled Substance Reporting system. A 12 month History has been reviewed on  the [facility] Controlled Substance Reporting System02/28/2022. 6. Peripheral Neuropathy:Continuecurrent medication regimen withGabapentin:01/14/2021.  F/U in18months

## 2021-01-22 ENCOUNTER — Other Ambulatory Visit (HOSPITAL_COMMUNITY)
Admission: RE | Admit: 2021-01-22 | Discharge: 2021-01-22 | Disposition: A | Discharge status: Home / Self Care | Payer: Medicare Other | Source: Ambulatory Visit | Attending: Pulmonary Disease | Admitting: Pulmonary Disease

## 2021-01-22 DIAGNOSIS — Z01812 Encounter for preprocedural laboratory examination: Secondary | ICD-10-CM | POA: Insufficient documentation

## 2021-01-22 DIAGNOSIS — Z20822 Contact with and (suspected) exposure to covid-19: Secondary | ICD-10-CM | POA: Diagnosis not present

## 2021-01-22 LAB — TOXASSURE SELECT,+ANTIDEPR,UR

## 2021-01-22 LAB — SARS CORONAVIRUS 2 (TAT 6-24 HRS): SARS Coronavirus 2: NEGATIVE

## 2021-01-24 ENCOUNTER — Telehealth: Payer: Self-pay | Admitting: *Deleted

## 2021-01-24 NOTE — Telephone Encounter (Signed)
Urine drug screen for this encounter is consistent for prescribed medication 

## 2021-01-25 ENCOUNTER — Ambulatory Visit (INDEPENDENT_AMBULATORY_CARE_PROVIDER_SITE_OTHER): Payer: Medicare Other | Admitting: Pulmonary Disease

## 2021-01-25 ENCOUNTER — Ambulatory Visit: Payer: Self-pay | Admitting: Primary Care

## 2021-01-25 ENCOUNTER — Other Ambulatory Visit: Payer: Self-pay

## 2021-01-25 DIAGNOSIS — R0602 Shortness of breath: Secondary | ICD-10-CM

## 2021-01-25 DIAGNOSIS — U071 COVID-19: Secondary | ICD-10-CM

## 2021-01-25 LAB — PULMONARY FUNCTION TEST
DL/VA % pred: 114 %
DL/VA: 5.01 ml/min/mmHg/L
DLCO cor % pred: 81 %
DLCO cor: 19.77 ml/min/mmHg
DLCO unc % pred: 80 %
DLCO unc: 19.58 ml/min/mmHg
FEF 25-75 Post: 3.21 L/sec
FEF 25-75 Pre: 1.23 L/sec
FEF2575-%Change-Post: 160 %
FEF2575-%Pred-Post: 94 %
FEF2575-%Pred-Pre: 36 %
FEV1-%Change-Post: 34 %
FEV1-%Pred-Post: 75 %
FEV1-%Pred-Pre: 55 %
FEV1-Post: 2.51 L
FEV1-Pre: 1.87 L
FEV1FVC-%Change-Post: 1 %
FEV1FVC-%Pred-Pre: 90 %
FEV6-%Change-Post: 32 %
FEV6-%Pred-Post: 82 %
FEV6-%Pred-Pre: 62 %
FEV6-Post: 3.32 L
FEV6-Pre: 2.5 L
FEV6FVC-%Pred-Post: 101 %
FEV6FVC-%Pred-Pre: 101 %
FVC-%Change-Post: 32 %
FVC-%Pred-Post: 81 %
FVC-%Pred-Pre: 61 %
FVC-Post: 3.32 L
FVC-Pre: 2.5 L
Post FEV1/FVC ratio: 76 %
Post FEV6/FVC ratio: 100 %
Pre FEV1/FVC ratio: 75 %
Pre FEV6/FVC Ratio: 100 %
RV % pred: 80 %
RV: 1.34 L
TLC % pred: 78 %
TLC: 4.27 L

## 2021-01-25 NOTE — Progress Notes (Signed)
PFT done today. 

## 2021-01-29 ENCOUNTER — Ambulatory Visit: Payer: Medicare Other | Admitting: Pulmonary Disease

## 2021-01-29 ENCOUNTER — Encounter: Payer: Self-pay | Admitting: Pulmonary Disease

## 2021-01-29 ENCOUNTER — Other Ambulatory Visit: Payer: Self-pay

## 2021-01-29 VITALS — BP 128/82 | HR 85 | Temp 97.7°F | Ht 67.0 in | Wt 224.6 lb

## 2021-01-29 DIAGNOSIS — J4540 Moderate persistent asthma, uncomplicated: Secondary | ICD-10-CM | POA: Diagnosis not present

## 2021-01-29 DIAGNOSIS — J301 Allergic rhinitis due to pollen: Secondary | ICD-10-CM

## 2021-01-29 NOTE — Progress Notes (Signed)
Pulmonary, Critical Care, and Sleep Medicine  Chief Complaint  Patient presents with  . Follow-up    F/U after PFT on 01/25/21. States her breathing has slowly improved since last visit. Still using Symbicort twice daily. Chest pain is almost gone.     Constitutional:  BP 128/82   Pulse 85   Temp 97.7 F (36.5 C) (Temporal)   Ht 5\' 7"  (1.702 m)   Wt 224 lb 9.6 oz (101.9 kg)   SpO2 98% Comment: on RA  BMI 35.18 kg/m   Past Medical History:  Chronic pain, Lymphedema, Allergies, Neuropathy, Tachycardia, Osteoporosis, IBS, GERD, Depression, Positive ANA, COVID 04 November 2020  Past Surgical History:  She  has a past surgical history that includes Knee surgery (2002/2003); Rectal surgery (correction of prolapse); Colonoscopy; Esophageal manometry (N/A, 10/29/2015); and 24 hour ph study (N/A, 10/29/2015).  Brief Summary:  Autumn Davis is a 35 y.o. female with allergic asthma.      Subjective:   She had COVID 19 infection in December.  Was having chest pain, cough, and hypoxia.  Had trouble laying flat.  All these improved.  Not needing albuterol much over past week.  Using symbicort bid helps.  No sinus congestion, wheeze, or sputum.  CT chest from February showed mild scarring.  PFT from March showed bronchodilator responsiveness.  Physical Exam:   Appearance - well kempt   ENMT - no sinus tenderness, no oral exudate, no LAN, Mallampati 3 airway, no stridor  Respiratory - equal breath sounds bilaterally, no wheezing or rales  CV - s1s2 regular rate and rhythm, no murmurs  Ext - no clubbing, no edema  Skin - no rashes  Psych - normal mood and affect   Pulmonary testing:   PFT 05/05/14 >> FEV1 1.60 (44%), FEV1% 69, TLC 3.97 (70%), difficulty with test maneuvers  HRCT chest 07/21/14 >> normal  PFT 01/05/19 >> FEV1 2.76 (78%), FEV1% 80, TLC 3.95 (69%), DLCO 86%, + BD  PFT 01/25/21 >> FEV1 2.51 (75%), FEV1% 76, TLC 4.27 (78%), DLCO 80%, +BD  Chest Imaging:    HRCT chest 12/25/20 >> scarring of lung bases and lingula  Sleep Tests:   HST 01/05/19 >> AHI 3.4, SpO2 low 83%  Cardiac Tests:   CPST 08/18/14 >> circulatory and muscular limitations  Echo 09/15/14 >> EF 60 to 65%  Social History:  She  reports that she has never smoked. She has never used smokeless tobacco. She reports that she does not drink alcohol and does not use drugs.  Family History:  Her family history includes Cancer in her paternal grandmother; Colon polyps in her father; Irritable bowel syndrome in her brother; Lung cancer in her maternal grandmother; Thyroid disease in her mother.     Assessment/Plan:   Moderate, persistent asthma. - continue symbicort, singulair - prn albuterol  Allergic rhinitis. - continue zyrtec, singulair, qnasal  Long term prednisone therapy with secondary adrenal insufficiency. - followed by Dr. 09/17/14 with endocrinology  Time Spent Involved in Patient Care on Day of Examination:  24 minutes  Follow up:  Patient Instructions  Follow up in 4 months   Medication List:   Allergies as of 01/29/2021      Reactions   Ciprofloxacin Other (See Comments)   Muscle weakness and numbness   Compazine Other (See Comments)   hallucinations   Prochlorperazine Other (See Comments), Rash   Pt states it makes her feel like things are crawling on her      Medication  List       Accurate as of January 29, 2021 11:42 AM. If you have any questions, ask your nurse or doctor.        STOP taking these medications   mupirocin ointment 2 % Commonly known as: Bactroban Stopped by: Coralyn Helling, MD     TAKE these medications   acetaZOLAMIDE 250 MG tablet Commonly known as: DIAMOX TAKE 1 TABLET EVERY MORNING, TAKE 1 TABLET AT 2PM, AND TAKE 2 TABLETSAT BEDTIME   amitriptyline 75 MG tablet Commonly known as: ELAVIL Take 1 tablet (75 mg total) by mouth at bedtime.   atorvastatin 40 MG tablet Commonly known as: LIPITOR TAKE ONE (1)  TABLET EACH DAY   B12 FAST DISSOLVE PO Take by mouth 2 (two) times daily.   cyanocobalamin 1000 MCG/ML injection Commonly known as: (VITAMIN B-12) INJECT IM EVERY 30 DAYS   baclofen 20 MG tablet Commonly known as: LIORESAL TAKE ONE TABLET FOUR TIMES DAILY   budesonide-formoterol 160-4.5 MCG/ACT inhaler Commonly known as: Symbicort Inhale 2 puffs into the lungs in the morning and at bedtime.   CALCIUM 1200+D3 PO Take 1 tablet by mouth daily.   cetirizine 10 MG tablet Commonly known as: ZYRTEC Take 10 mg by mouth daily.   cycloSPORINE 0.05 % ophthalmic emulsion Commonly known as: RESTASIS Place 1 drop into both eyes 2 (two) times daily.   diclofenac 75 MG EC tablet Commonly known as: VOLTAREN TAKE ONE TABLET BY MOUTH TWICE DAILY   docusate sodium 100 MG capsule Commonly known as: COLACE Take 100 mg by mouth 2 (two) times daily. PRN   EPINEPHrine 0.3 mg/0.3 mL Soaj injection Commonly known as: EPI-PEN USE AS DIRECTED   furosemide 20 MG tablet Commonly known as: LASIX Take 1 tablet (20 mg total) by mouth daily.   gabapentin 600 MG tablet Commonly known as: NEURONTIN TAKE 1 TABLET 5 TIMES DAILY   ipratropium-albuterol 0.5-2.5 (3) MG/3ML Soln Commonly known as: DUONEB Take 3 mLs by nebulization every 4 (four) hours as needed.   L-Methylfolate 15 MG Tabs Take 1 tablet (15 mg total) by mouth daily.   metoprolol succinate 25 MG 24 hr tablet Commonly known as: TOPROL-XL Take 1 tablet (25 mg total) by mouth daily.   montelukast 10 MG tablet Commonly known as: SINGULAIR Take 1 tablet (10 mg total) by mouth at bedtime.   nystatin 100000 UNIT/ML suspension Commonly known as: MYCOSTATIN Use as directed 5 mLs (500,000 Units total) in the mouth or throat 4 (four) times daily.   nystatin-triamcinolone ointment Commonly known as: MYCOLOG Apply 1 application topically 2 (two) times daily.   Omega 3 1000 MG Caps Take 1 capsule by mouth daily.    oxyCODONE-acetaminophen 7.5-325 MG tablet Commonly known as: Percocet Take 1 tablet by mouth 5 (five) times daily as needed for moderate pain. No More Than 5 a day.   predniSONE 5 MG tablet Commonly known as: DELTASONE Take 1 tablet (5 mg total) by mouth every morning.   promethazine 12.5 MG tablet Commonly known as: PHENERGAN Take 1 tablet (12.5 mg total) by mouth every 8 (eight) hours as needed for nausea or vomiting.   Qnasl 80 MCG/ACT Aers Generic drug: Beclomethasone Dipropionate USE 2 SPRAYS IN EACH NOSTRIL DAILY   tamsulosin 0.4 MG Caps capsule Commonly known as: FLOMAX TAKE ONE (1) CAPSULE EACH DAY   Ventolin HFA 108 (90 Base) MCG/ACT inhaler Generic drug: albuterol USE 2 PUFFS EVERY 4 TO 6 HOURS AS NEEDED   VITAMIN C ADULT  GUMMIES PO Take 2 each by mouth daily.   Vitamin D (Ergocalciferol) 1.25 MG (50000 UNIT) Caps capsule Commonly known as: DRISDOL 1 po on Monday and thursday   Vitamin D3 1.25 MG (50000 UT) Caps Take 50,000 Units by mouth once a week.   vitamin E 180 MG (400 UNITS) capsule Take 1,000 Units by mouth daily.   ZINC PO Take 1 tablet by mouth daily.       Signature:  Coralyn Helling, MD [facility] Pulmonary/Critical Care Pager - [phone] 01/29/2021, 11:42 AM

## 2021-01-29 NOTE — Patient Instructions (Signed)
Follow up in 4 months 

## 2021-02-07 ENCOUNTER — Other Ambulatory Visit: Payer: Self-pay | Admitting: Nurse Practitioner

## 2021-02-18 ENCOUNTER — Other Ambulatory Visit: Payer: Self-pay

## 2021-02-18 ENCOUNTER — Encounter: Payer: Self-pay | Admitting: Nurse Practitioner

## 2021-02-18 ENCOUNTER — Ambulatory Visit (INDEPENDENT_AMBULATORY_CARE_PROVIDER_SITE_OTHER): Payer: Medicare Other | Admitting: Nurse Practitioner

## 2021-02-18 VITALS — BP 127/87 | HR 91 | Temp 98.3°F | Ht 67.0 in | Wt 225.0 lb

## 2021-02-18 DIAGNOSIS — K581 Irritable bowel syndrome with constipation: Secondary | ICD-10-CM

## 2021-02-18 DIAGNOSIS — E782 Mixed hyperlipidemia: Secondary | ICD-10-CM

## 2021-02-18 DIAGNOSIS — E079 Disorder of thyroid, unspecified: Secondary | ICD-10-CM

## 2021-02-18 DIAGNOSIS — R569 Unspecified convulsions: Secondary | ICD-10-CM | POA: Diagnosis not present

## 2021-02-18 DIAGNOSIS — K219 Gastro-esophageal reflux disease without esophagitis: Secondary | ICD-10-CM

## 2021-02-18 DIAGNOSIS — D8989 Other specified disorders involving the immune mechanism, not elsewhere classified: Secondary | ICD-10-CM

## 2021-02-18 DIAGNOSIS — R600 Localized edema: Secondary | ICD-10-CM

## 2021-02-18 DIAGNOSIS — R609 Edema, unspecified: Secondary | ICD-10-CM

## 2021-02-18 DIAGNOSIS — M62838 Other muscle spasm: Secondary | ICD-10-CM | POA: Diagnosis not present

## 2021-02-18 DIAGNOSIS — F3342 Major depressive disorder, recurrent, in full remission: Secondary | ICD-10-CM

## 2021-02-18 DIAGNOSIS — I479 Paroxysmal tachycardia, unspecified: Secondary | ICD-10-CM | POA: Diagnosis not present

## 2021-02-18 DIAGNOSIS — R350 Frequency of micturition: Secondary | ICD-10-CM

## 2021-02-18 DIAGNOSIS — R6889 Other general symptoms and signs: Secondary | ICD-10-CM | POA: Diagnosis not present

## 2021-02-18 DIAGNOSIS — Z6833 Body mass index (BMI) 33.0-33.9, adult: Secondary | ICD-10-CM

## 2021-02-18 DIAGNOSIS — R1319 Other dysphagia: Secondary | ICD-10-CM

## 2021-02-18 DIAGNOSIS — E559 Vitamin D deficiency, unspecified: Secondary | ICD-10-CM

## 2021-02-18 DIAGNOSIS — M797 Fibromyalgia: Secondary | ICD-10-CM

## 2021-02-18 DIAGNOSIS — M329 Systemic lupus erythematosus, unspecified: Secondary | ICD-10-CM

## 2021-02-18 DIAGNOSIS — M818 Other osteoporosis without current pathological fracture: Secondary | ICD-10-CM | POA: Diagnosis not present

## 2021-02-18 MED ORDER — TAMSULOSIN HCL 0.4 MG PO CAPS: ORAL_CAPSULE | ORAL | 1 refills | Status: DC

## 2021-02-18 MED ORDER — FUROSEMIDE 20 MG PO TABS: 20.0000 mg | ORAL_TABLET | Freq: Every day | ORAL | 1 refills | Status: DC

## 2021-02-18 MED ORDER — PREDNISONE 5 MG PO TABS
5.0000 mg | ORAL_TABLET | Freq: Every morning | ORAL | 1 refills | Status: DC
Start: 2021-02-18 — End: 2021-11-25

## 2021-02-18 MED ORDER — BACLOFEN 20 MG PO TABS: ORAL_TABLET | ORAL | 1 refills | Status: DC

## 2021-02-18 MED ORDER — MONTELUKAST SODIUM 10 MG PO TABS: 10.0000 mg | ORAL_TABLET | Freq: Every day | ORAL | 1 refills | Status: DC

## 2021-02-18 MED ORDER — METOPROLOL SUCCINATE ER 25 MG PO TB24
25.0000 mg | ORAL_TABLET | Freq: Every day | ORAL | 1 refills | Status: DC
Start: 2021-02-18 — End: 2021-05-24

## 2021-02-18 MED ORDER — ATORVASTATIN CALCIUM 40 MG PO TABS
ORAL_TABLET | ORAL | 1 refills | Status: DC
Start: 2021-02-18 — End: 2021-05-24

## 2021-02-18 MED ORDER — GABAPENTIN 600 MG PO TABS
ORAL_TABLET | ORAL | 1 refills | Status: DC
Start: 2021-02-18 — End: 2021-11-25

## 2021-02-18 MED ORDER — ACETAZOLAMIDE 250 MG PO TABS
ORAL_TABLET | ORAL | 1 refills | Status: DC
Start: 2021-02-18 — End: 2021-05-24

## 2021-02-18 NOTE — Patient Instructions (Signed)
Myofascial Pain Syndrome and Fibromyalgia Myofascial pain syndrome and fibromyalgia are both pain disorders. This pain may be felt mainly in your muscles.  Myofascial pain syndrome: ? Always has tender points in the muscle that will cause pain when pressed (trigger points). The pain may come and go. ? Usually affects your neck, upper back, and shoulder areas. The pain often radiates into your arms and hands.  Fibromyalgia: ? Has muscle pains and tenderness that come and go. ? Is often associated with fatigue and sleep problems. ? Has trigger points. ? Tends to be long-lasting (chronic), but is not life-threatening. Fibromyalgia and myofascial pain syndrome are not the same. However, they often occur together. If you have both conditions, each can make the other worse. Both are common and can cause enough pain and fatigue to make day-to-day activities difficult. Both can be hard to diagnose because their symptoms are common in many other conditions. What are the causes? The exact causes of these conditions are not known. What increases the risk? You are more likely to develop this condition if:  You have a family history of the condition.  You have certain triggers, such as: ? Spine disorders. ? An injury (trauma) or other physical stressors. ? Being under a lot of stress. ? Medical conditions such as osteoarthritis, rheumatoid arthritis, or lupus. What are the signs or symptoms? Fibromyalgia The main symptom of fibromyalgia is widespread pain and tenderness in your muscles. Pain is sometimes described as stabbing, shooting, or burning. You may also have:  Tingling or numbness.  Sleep problems and fatigue.  Problems with attention and concentration (fibro fog). Other symptoms may include:  Bowel and bladder problems.  Headaches.  Visual problems.  Problems with odors and noises.  Depression or mood changes.  Painful menstrual periods (dysmenorrhea).  Dry skin or  eyes. These symptoms can vary over time. Myofascial pain syndrome Symptoms of myofascial pain syndrome include:  Tight, ropy bands of muscle.  Uncomfortable sensations in muscle areas. These may include aching, cramping, burning, numbness, tingling, and weakness.  Difficulty moving certain parts of the body freely (poor range of motion). How is this diagnosed? This condition may be diagnosed by your symptoms and medical history. You will also have a physical exam. In general:  Fibromyalgia is diagnosed if you have pain, fatigue, and other symptoms for more than 3 months, and symptoms cannot be explained by another condition.  Myofascial pain syndrome is diagnosed if you have trigger points in your muscles, and those trigger points are tender and cause pain elsewhere in your body (referred pain). How is this treated? Treatment for these conditions depends on the type that you have.  For fibromyalgia: ? Pain medicines, such as NSAIDs. ? Medicines for treating depression. ? Medicines for treating seizures. ? Medicines that relax the muscles.  For myofascial pain: ? Pain medicines, such as NSAIDs. ? Cooling and stretching of muscles. ? Trigger point injections. ? Sound wave (ultrasound) treatments to stimulate muscles. Treating these conditions often requires a team of health care providers. These may include:  Your primary care provider.  Physical therapist.  Complementary health care providers, such as massage therapists or acupuncturists.  Psychiatrist for cognitive behavioral therapy.   Follow these instructions at home: Medicines  Take over-the-counter and prescription medicines only as told by your health care provider.  Do not drive or use heavy machinery while taking prescription pain medicine.  If you are taking prescription pain medicine, take actions to prevent or treat constipation. Your   health care provider may recommend that you: ? Drink enough fluid to keep  your urine pale yellow. ? Eat foods that are high in fiber, such as fresh fruits and vegetables, whole grains, and beans. ? Limit foods that are high in fat and processed sugars, such as fried or sweet foods. ? Take an over-the-counter or prescription medicine for constipation. Lifestyle  Exercise as directed by your health care provider or physical therapist.  Practice relaxation techniques to control your stress. You may want to try: ? Biofeedback. ? Visual imagery. ? Hypnosis. ? Muscle relaxation. ? Yoga. ? Meditation.  Maintain a healthy lifestyle. This includes eating a healthy diet and getting enough sleep.  Do not use any products that contain nicotine or tobacco, such as cigarettes and e-cigarettes. If you need help quitting, ask your health care provider.   General instructions  Talk to your health care provider about complementary treatments, such as acupuncture or massage.  Consider joining a support group with others who are diagnosed with this condition.  Do not do activities that stress or strain your muscles. This includes repetitive motions and heavy lifting.  Keep all follow-up visits as told by your health care provider. This is important. Where to find more information  National Fibromyalgia Association: www.fmaware.org  Arthritis Foundation: www.arthritis.org  American Chronic Pain Association: www.theacpa.org Contact a health care provider if:  You have new symptoms.  Your symptoms get worse or your pain is severe.  You have side effects from your medicines.  You have trouble sleeping.  Your condition is causing depression or anxiety. Summary  Myofascial pain syndrome and fibromyalgia are pain disorders.  Myofascial pain syndrome has tender points in the muscle that will cause pain when pressed (trigger points). Fibromyalgia also has muscle pains and tenderness that come and go, but this condition is often associated with fatigue and sleep  disturbances.  Fibromyalgia and myofascial pain syndrome are not the same but often occur together, causing pain and fatigue that make day-to-day activities difficult.  Treatment for fibromyalgia includes taking medicines to relax the muscles and medicines for pain, depression, or seizures. Treatment for myofascial pain syndrome includes taking medicines for pain, cooling and stretching of muscles, and injecting medicines into trigger points.  Follow your health care provider's instructions for taking medicines and maintaining a healthy lifestyle. This information is not intended to replace advice given to you by your health care provider. Make sure you discuss any questions you have with your health care provider. Document Revised: 02/25/2019 Document Reviewed: 11/18/2017 Elsevier Patient Education  2021 [employer]  

## 2021-02-18 NOTE — Progress Notes (Signed)
Subjective:    Patient ID: Autumn Davis, female    DOB: 1986-10-20, 35 y.o.   MRN: [id number]   Chief Complaint: medical management of chronic issues     HPI:  1. Tachycardia, paroxysmal (HCC) She ha had occasional episodes that only last a couple of minutes. Around 120-130 when occurs  2. Esophageal dysphagia Has been doing better and only has trouble occasionally. Seems to be worse later in the day.   3. Gastroesophageal reflux disease without esophagitis only has occasionally so just uses OTC meds  4. Irritable bowel syndrome with constipation She is still on elavil and baclofen. She flip flops from constipation to diarrhea. Is usually constipated.  5. Thyroid disease No problems that awar eof. Lab Results  Component Value Date   TSH 3.100 11/20/2020     6. Mixed hyperlipidemia Gaylyn Rong snot been watching diet and is doing very little exercise due to body pain  7. Recurrent major depressive disorder, in full remission [facility]) She is not currently on an antidepressant  And thinks she is doing ok. Lab Results  Component Value Date   TSH 3.100 11/20/2020     8. Fibromyalgia Has constant body aches and see pain management monthly.  9. Inflammatory autoimmune disorder [facility]) That are still not sure exactly what is going on with her. Her labs constantly are flip flopping and no one can really figure things out. They do still have her on prednisone daily which helps  10. Systemic lupus erythematosus, unspecified SLE type, unspecified organ involvement status (HCC) Again we are still not sure if she has lupus. Her ANA will be high on evisit and normal at next visit  11. Muscle spasticity Has often and is on baclofen  12. Seizures (HCC) Is on diamox and has not had any seizure activity  13. Urinary frequency Use to be on myrbetric but insurance stopped covering it so she stopped taking. Has urinry frequency and is on lasix so she voids a lot.  14. Vitamin D deficiency Is  on daily vitamin d supplement  15. BMI 33.0-33.9,adult Wt Readings from Last 3 Encounters:  02/18/21 225 lb (102.1 kg)  01/29/21 224 lb 9.6 oz (101.9 kg)  01/14/21 222 lb 9.6 oz (101 kg)   BMI Readings from Last 3 Encounters:  02/18/21 35.24 kg/m  01/29/21 35.18 kg/m  01/14/21 34.86 kg/m       Outpatient Encounter Medications as of 02/18/2021  Medication Sig  . acetaZOLAMIDE (DIAMOX) 250 MG tablet TAKE 1 TABLET EVERY MORNING, TAKE 1 TABLET AT 2PM, AND TAKE 2 TABLETSAT BEDTIME  . amitriptyline (ELAVIL) 75 MG tablet Take 1 tablet (75 mg total) by mouth at bedtime.  . Ascorbic Acid (VITAMIN C ADULT GUMMIES PO) Take 2 each by mouth daily.  Marland Kitchen atorvastatin (LIPITOR) 40 MG tablet TAKE ONE (1) TABLET EACH DAY  . baclofen (LIORESAL) 20 MG tablet TAKE ONE TABLET FOUR TIMES DAILY  . budesonide-formoterol (SYMBICORT) 160-4.5 MCG/ACT inhaler Inhale 2 puffs into the lungs in the morning and at bedtime.  . Calcium-Magnesium-Vitamin D (CALCIUM 1200+D3 PO) Take 1 tablet by mouth daily.  . cetirizine (ZYRTEC) 10 MG tablet Take 10 mg by mouth daily.  . Cholecalciferol (VITAMIN D3) 50000 units CAPS Take 50,000 Units by mouth once a week.  . cyanocobalamin (,VITAMIN B-12,) 1000 MCG/ML injection INJECT IM EVERY 30 DAYS  . Cyanocobalamin (B12 FAST DISSOLVE PO) Take by mouth 2 (two) times daily.  . cycloSPORINE (RESTASIS) 0.05 % ophthalmic emulsion Place 1 drop into  both eyes 2 (two) times daily.  . diclofenac (VOLTAREN) 75 MG EC tablet TAKE ONE TABLET BY MOUTH TWICE DAILY  . docusate sodium (COLACE) 100 MG capsule Take 100 mg by mouth 2 (two) times daily. PRN  . EPINEPHRINE 0.3 mg/0.3 mL IJ SOAJ injection USE AS DIRECTED  . furosemide (LASIX) 20 MG tablet Take 1 tablet (20 mg total) by mouth daily.  Marland Kitchen gabapentin (NEURONTIN) 600 MG tablet TAKE 1 TABLET 5 TIMES DAILY  . ipratropium-albuterol (DUONEB) 0.5-2.5 (3) MG/3ML SOLN Take 3 mLs by nebulization every 4 (four) hours as needed.  Marland Kitchen L-Methylfolate  15 MG TABS Take 1 tablet (15 mg total) by mouth daily.  . metoprolol succinate (TOPROL-XL) 25 MG 24 hr tablet Take 1 tablet (25 mg total) by mouth daily.  . montelukast (SINGULAIR) 10 MG tablet Take 1 tablet (10 mg total) by mouth at bedtime.  . Multiple Vitamins-Minerals (ZINC PO) Take 1 tablet by mouth daily.  Marland Kitchen nystatin (MYCOSTATIN) 100000 UNIT/ML suspension Use as directed 5 mLs (500,000 Units total) in the mouth or throat 4 (four) times daily.  Marland Kitchen nystatin-triamcinolone ointment (MYCOLOG) Apply 1 application topically 2 (two) times daily.  . Omega 3 1000 MG CAPS Take 1 capsule by mouth daily.  Marland Kitchen oxyCODONE-acetaminophen (PERCOCET) 7.5-325 MG tablet Take 1 tablet by mouth 5 (five) times daily as needed for moderate pain. No More Than 5 a day.  . predniSONE (DELTASONE) 5 MG tablet Take 1 tablet (5 mg total) by mouth every morning.  . promethazine (PHENERGAN) 12.5 MG tablet Take 1 tablet (12.5 mg total) by mouth every 8 (eight) hours as needed for nausea or vomiting.  . QNASL 80 MCG/ACT AERS USE 2 SPRAYS IN EACH NOSTRIL DAILY  . tamsulosin (FLOMAX) 0.4 MG CAPS capsule TAKE ONE (1) CAPSULE EACH DAY  . VENTOLIN HFA 108 (90 Base) MCG/ACT inhaler USE 2 PUFFS EVERY 4 TO 6 HOURS AS NEEDED  . Vitamin D, Ergocalciferol, (DRISDOL) 1.25 MG (50000 UNIT) CAPS capsule 1 po on Monday and thursday  . vitamin E 180 MG (400 UNITS) capsule Take 1,000 Units by mouth daily.    No facility-administered encounter medications on file as of 02/18/2021.    Past Surgical History:  Procedure Laterality Date  . 24 HOUR PH STUDY N/A 10/29/2015   Procedure: 24 HOUR PH STUDY;  Surgeon: Iva Boop, MD;  Location: [facility];  Service: Endoscopy;  Laterality: N/A;  . COLONOSCOPY    . ESOPHAGEAL MANOMETRY N/A 10/29/2015   Procedure: ESOPHAGEAL MANOMETRY (EM);  Surgeon: Iva Boop, MD;  Location: [facility];  Service: Endoscopy;  Laterality: N/A;  . KNEE SURGERY  2002/2003   bil  . RECTAL SURGERY  correction of  prolapse   2010    Family History  Problem Relation Age of Onset  . Thyroid disease Mother   . Colon polyps Father   . Lung cancer Maternal Grandmother   . Cancer Paternal Grandmother        colon/pancreatiec/lymphoma  . Irritable bowel syndrome Brother   . AAA (abdominal aortic aneurysm) Neg Hx     New complaints: Just feels really fayigued  Social history: lives with her parents and has a child she is raising.  Controlled substance contract: n/a    Review of Systems  Constitutional: Negative for diaphoresis.  Eyes: Negative for pain.  Respiratory: Negative for shortness of breath.   Cardiovascular: Negative for chest pain, palpitations and leg swelling.  Gastrointestinal: Negative for abdominal pain.  Endocrine: Negative for polydipsia.  Skin: Negative for rash.  Neurological: Negative for dizziness, weakness and headaches.  Hematological: Does not bruise/bleed easily.  All other systems reviewed and are negative.      Objective:   Physical Exam Vitals and nursing note reviewed.  Constitutional:      General: She is not in acute distress.    Appearance: Normal appearance. She is well-developed.  HENT:     Head: Normocephalic.     Nose: Nose normal.  Eyes:     Pupils: Pupils are equal, round, and reactive to light.  Neck:     Vascular: No carotid bruit or JVD.  Cardiovascular:     Rate and Rhythm: Normal rate and regular rhythm.     Heart sounds: Normal heart sounds.  Pulmonary:     Effort: Pulmonary effort is normal. No respiratory distress.     Breath sounds: Normal breath sounds. No wheezing or rales.  Chest:     Chest wall: No tenderness.  Abdominal:     General: Bowel sounds are normal. There is no distension or abdominal bruit.     Palpations: Abdomen is soft. There is no hepatomegaly, splenomegaly, mass or pulsatile mass.     Tenderness: There is no abdominal tenderness.  Musculoskeletal:        General: Normal range of motion.     Cervical  back: Normal range of motion and neck supple.     Comments: Rises slowly from sitting to standing  Lymphadenopathy:     Cervical: No cervical adenopathy.  Skin:    General: Skin is warm and dry.  Neurological:     Mental Status: She is alert and oriented to person, place, and time.     Deep Tendon Reflexes: Reflexes are normal and symmetric.  Psychiatric:        Behavior: Behavior normal.        Thought Content: Thought content normal.        Judgment: Judgment normal.     BP 127/87   Pulse 91   Temp 98.3 F (36.8 C) (Temporal)   Ht 5\' 7"  (1.702 m)   Wt 225 lb (102.1 kg)   SpO2 96%   BMI 35.24 kg/m        Assessment & Plan:  Autumn Davis comes in today with chief complaint of Medical Management of Chronic Issues   Diagnosis and orders addressed:  1. Tachycardia, paroxysmal (HCC) Avoid caffeine - metoprolol succinate (TOPROL-XL) 25 MG 24 hr tablet; Take 1 tablet (25 mg total) by mouth daily.  Dispense: 90 tablet; Refill: 1  2. Esophageal dysphagia Chew foods well  3. Gastroesophageal reflux disease without esophagitis Avoid spicy foods Do not eat 2 hours prior to bedtime  4. Irritable bowel syndrome with constipation - baclofen (LIORESAL) 20 MG tablet; TAKE ONE TABLET FOUR TIMES DAILY  Dispense: 360 each; Refill: 1  5. Thyroid disease Labs pending - Thyroid Panel With TSH  6. Mixed hyperlipidemia Low fat diet - atorvastatin (LIPITOR) 40 MG tablet; TAKE ONE (1) TABLET EACH DAY  Dispense: 90 tablet; Refill: 1 - CBC with Differential/Platelet - CMP14+EGFR - Lipid panel  7. Recurrent major depressive disorder, in full remission (HCC) stress management  8. Fibromyalgia Moist heat Exercise to keep muscles wrm  9. Inflammatory autoimmune disorder (HCC) Keep follow up with specialist - predniSONE (DELTASONE) 5 MG tablet; Take 1 tablet (5 mg total) by mouth every morning.  Dispense: 90 tablet; Refill: 1 - gabapentin (NEURONTIN) 600 MG tablet; TAKE 1 TABLET 5  TIMES  DAILY  Dispense: 450 tablet; Refill: 1  10. Systemic lupus erythematosus, unspecified SLE type, unspecified organ involvement status (HCC) Labs pending - ANA, IFA Comprehensive Panel  11. Muscle spasticity  12. Seizures (HCC) - acetaZOLAMIDE (DIAMOX) 250 MG tablet; TAKE 1 TABLET EVERY MORNING, TAKE 1 TABLET AT 2PM, AND TAKE 2 TABLETSAT BEDTIME  Dispense: 360 tablet; Refill: 1  13. Urinary frequency - tamsulosin (FLOMAX) 0.4 MG CAPS capsule; TAKE ONE (1) CAPSULE EACH DAY  Dispense: 90 capsule; Refill: 1 - Vitamin B12 - VITAMIN D 25 Hydroxy (Vit-D Deficiency, Fractures)  14. Vitamin D deficiency Continue vitamin d OTC  15. BMI 33.0-33.9,adult Discussed diet and exercise for person with BMI >25 Will recheck weight in 3-6 months  16. Peripheral edema Elevate legs when sitting - furosemide (LASIX) 20 MG tablet; Take 1 tablet (20 mg total) by mouth daily.  Dispense: 90 tablet; Refill: 1   Labs pending Health Maintenance reviewed Diet and exercise encouraged  Follow up plan: 3 months   Mary-Margaret Daphine Deutscher, FNP

## 2021-02-18 NOTE — Addendum Note (Signed)
Addended by: [name] on: 02/18/2021 01:47 PM   Modules accepted: Orders

## 2021-02-19 LAB — CMP14+EGFR
ALT: 18 IU/L (ref 0–32)
AST: 18 IU/L (ref 0–40)
Albumin/Globulin Ratio: 1.7 (ref 1.2–2.2)
Albumin: 4.1 g/dL (ref 3.8–4.8)
Alkaline Phosphatase: 92 IU/L (ref 44–121)
BUN/Creatinine Ratio: 12 (ref 9–23)
BUN: 9 mg/dL (ref 6–20)
Bilirubin Total: 0.3 mg/dL (ref 0.0–1.2)
CO2: 21 mmol/L (ref 20–29)
Calcium: 9.1 mg/dL (ref 8.7–10.2)
Chloride: 107 mmol/L — ABNORMAL HIGH (ref 96–106)
Creatinine, Ser: 0.74 mg/dL (ref 0.57–1.00)
Globulin, Total: 2.4 g/dL (ref 1.5–4.5)
Glucose: 119 mg/dL — ABNORMAL HIGH (ref 65–99)
Potassium: 3.6 mmol/L (ref 3.5–5.2)
Sodium: 144 mmol/L (ref 134–144)
Total Protein: 6.5 g/dL (ref 6.0–8.5)
eGFR: 107 mL/min/{1.73_m2} (ref >59)

## 2021-02-19 LAB — THYROID PANEL WITH TSH
Free Thyroxine Index: 2.5 (ref 1.2–4.9)
T3 Uptake Ratio: 30 % (ref 24–39)
T4, Total: 8.2 ug/dL (ref 4.5–12.0)
TSH: 1.79 u[IU]/mL (ref 0.450–4.500)

## 2021-02-19 LAB — LIPID PANEL
Chol/HDL Ratio: 3.4 ratio (ref 0.0–4.4)
Cholesterol, Total: 210 mg/dL — ABNORMAL HIGH (ref 100–199)
HDL: 62 mg/dL (ref >39)
LDL Chol Calc (NIH): 109 mg/dL — ABNORMAL HIGH (ref 0–99)
Triglycerides: 227 mg/dL — ABNORMAL HIGH (ref 0–149)
VLDL Cholesterol Cal: 39 mg/dL (ref 5–40)

## 2021-02-19 LAB — CBC WITH DIFFERENTIAL/PLATELET
Basophils Absolute: 0.1 10*3/uL (ref 0.0–0.2)
Basos: 1 %
EOS (ABSOLUTE): 0.2 10*3/uL (ref 0.0–0.4)
Eos: 2 %
Hematocrit: 38.6 % (ref 34.0–46.6)
Hemoglobin: 12.6 g/dL (ref 11.1–15.9)
Immature Grans (Abs): 0.1 10*3/uL (ref 0.0–0.1)
Immature Granulocytes: 1 %
Lymphocytes Absolute: 2 10*3/uL (ref 0.7–3.1)
Lymphs: 21 %
MCH: 29.6 pg (ref 26.6–33.0)
MCHC: 32.6 g/dL (ref 31.5–35.7)
MCV: 91 fL (ref 79–97)
Monocytes Absolute: 0.7 10*3/uL (ref 0.1–0.9)
Monocytes: 8 %
Neutrophils Absolute: 6.5 10*3/uL (ref 1.4–7.0)
Neutrophils: 67 %
Platelets: 282 10*3/uL (ref 150–450)
RBC: 4.26 x10E6/uL (ref 3.77–5.28)
RDW: 13.1 % (ref 11.7–15.4)
WBC: 9.5 10*3/uL (ref 3.4–10.8)

## 2021-02-19 LAB — VITAMIN D 25 HYDROXY (VIT D DEFICIENCY, FRACTURES): Vit D, 25-Hydroxy: 25.1 ng/mL — ABNORMAL LOW (ref 30.0–100.0)

## 2021-02-19 LAB — VITAMIN B12: Vitamin B-12: 1014 pg/mL (ref 232–1245)

## 2021-02-20 LAB — ANA,IFA RA DIAG PNL W/RFLX TIT/PATN
ANA Titer 1: NEGATIVE
Cyclic Citrullin Peptide Ab: 6 units (ref 0–19)
Rheumatoid fact SerPl-aCnc: <10 IU/mL (ref <14.0)

## 2021-03-01 ENCOUNTER — Other Ambulatory Visit: Payer: Self-pay | Admitting: Nurse Practitioner

## 2021-03-10 ENCOUNTER — Other Ambulatory Visit: Payer: Self-pay | Admitting: Nurse Practitioner

## 2021-03-10 DIAGNOSIS — D8989 Other specified disorders involving the immune mechanism, not elsewhere classified: Secondary | ICD-10-CM

## 2021-03-14 ENCOUNTER — Encounter: Payer: Medicare Other | Admitting: Registered Nurse

## 2021-03-18 ENCOUNTER — Other Ambulatory Visit: Payer: Self-pay

## 2021-03-18 ENCOUNTER — Encounter: Payer: Medicare Other | Attending: Registered Nurse | Admitting: Registered Nurse

## 2021-03-18 ENCOUNTER — Encounter: Payer: Self-pay | Admitting: Registered Nurse

## 2021-03-18 VITALS — BP 129/88 | HR 82 | Temp 98.9°F | Ht 67.5 in | Wt 221.0 lb

## 2021-03-18 DIAGNOSIS — G894 Chronic pain syndrome: Secondary | ICD-10-CM | POA: Insufficient documentation

## 2021-03-18 DIAGNOSIS — D8989 Other specified disorders involving the immune mechanism, not elsewhere classified: Secondary | ICD-10-CM | POA: Insufficient documentation

## 2021-03-18 DIAGNOSIS — M7061 Trochanteric bursitis, right hip: Secondary | ICD-10-CM | POA: Insufficient documentation

## 2021-03-18 DIAGNOSIS — G609 Hereditary and idiopathic neuropathy, unspecified: Secondary | ICD-10-CM | POA: Diagnosis not present

## 2021-03-18 DIAGNOSIS — R569 Unspecified convulsions: Secondary | ICD-10-CM | POA: Diagnosis not present

## 2021-03-18 DIAGNOSIS — G8929 Other chronic pain: Secondary | ICD-10-CM | POA: Insufficient documentation

## 2021-03-18 DIAGNOSIS — Z79891 Long term (current) use of opiate analgesic: Secondary | ICD-10-CM | POA: Diagnosis not present

## 2021-03-18 DIAGNOSIS — M7062 Trochanteric bursitis, left hip: Secondary | ICD-10-CM | POA: Insufficient documentation

## 2021-03-18 DIAGNOSIS — M546 Pain in thoracic spine: Secondary | ICD-10-CM

## 2021-03-18 DIAGNOSIS — Z5181 Encounter for therapeutic drug level monitoring: Secondary | ICD-10-CM

## 2021-03-18 DIAGNOSIS — M62838 Other muscle spasm: Secondary | ICD-10-CM | POA: Diagnosis not present

## 2021-03-18 DIAGNOSIS — M797 Fibromyalgia: Secondary | ICD-10-CM | POA: Insufficient documentation

## 2021-03-18 MED ORDER — OXYCODONE-ACETAMINOPHEN 7.5-325 MG PO TABS: 1.0000 | ORAL_TABLET | Freq: Every day | ORAL | 0 refills | Status: DC | PRN

## 2021-03-18 NOTE — Progress Notes (Signed)
Subjective:    Patient ID: Autumn Davis, female    DOB: 01/06/1986, 35 y.o.   MRN: [id number]  HPI: Autumn Davis is a 35 y.o. female who returns for follow up appointment for chronic pain and medication refill. She states her pain is located in her mid- back and bilateral hips L>R. Also reports yesterday 03/17/2021 she had tingling in her face and bilateral upper extremities. She denies facial nerve pain and bilateral upper extremity pain at this time. She states she had the above in the past, she was encouraged to F/U with her neurologist, she verbalizes understanding. She rates her pain 6. Her  current exercise regime is walking and performing stretching exercises.  Autumn Davis Morphine equivalent is 56.25  MME.  Last UDS was performed on 01/14/2021, it was consistent.   Pain Inventory Average Pain 8 Pain Right Now 6 My pain is constant, sharp, burning, stabbing and tingling  In the last 24 hours, has pain interfered with the following? General activity 8 Relation with others 6 Enjoyment of life 9 What TIME of day is your pain at its worst? evening and night Sleep (in general) Poor  Pain is worse with: walking, bending, sitting, inactivity, standing and some activites Pain improves with: rest, heat/ice, therapy/exercise, pacing activities, medication and TENS Relief from Meds:   Family History  Problem Relation Age of Onset  . Thyroid disease Mother   . Colon polyps Father   . Lung cancer Maternal Grandmother   . Cancer Paternal Grandmother        colon/pancreatiec/lymphoma  . Irritable bowel syndrome Brother   . AAA (abdominal aortic aneurysm) Neg Hx    Social History   Socioeconomic History  . Marital status: Legally Separated    Spouse name: Not on file  . Number of children: Not on file  . Years of education: Not on file  . Highest education level: Not on file  Occupational History  . Occupation: disabled    Employer: [employer]  Tobacco Use  . Smoking  status: Never Smoker  . Smokeless tobacco: Never Used  Vaping Use  . Vaping Use: Never used  Substance and Sexual Activity  . Alcohol use: No    Alcohol/week: 0.0 standard drinks  . Drug use: No  . Sexual activity: Not on file  Other Topics Concern  . Not on file  Social History Narrative  . Not on file   Social Determinants of Health   Financial Resource Strain: Not on file  Food Insecurity: Not on file  Transportation Needs: Not on file  Physical Activity: Not on file  Stress: Not on file  Social Connections: Not on file   Past Surgical History:  Procedure Laterality Date  . 24 HOUR PH STUDY N/A 10/29/2015   Procedure: 24 HOUR PH STUDY;  Surgeon: Iva Boop, MD;  Location: [facility];  Service: Endoscopy;  Laterality: N/A;  . COLONOSCOPY    . ESOPHAGEAL MANOMETRY N/A 10/29/2015   Procedure: ESOPHAGEAL MANOMETRY (EM);  Surgeon: Iva Boop, MD;  Location: [facility];  Service: Endoscopy;  Laterality: N/A;  . KNEE SURGERY  2002/2003   bil  . RECTAL SURGERY  correction of prolapse   2010   Past Surgical History:  Procedure Laterality Date  . 24 HOUR PH STUDY N/A 10/29/2015   Procedure: 24 HOUR PH STUDY;  Surgeon: Iva Boop, MD;  Location: [facility];  Service: Endoscopy;  Laterality: N/A;  . COLONOSCOPY    . ESOPHAGEAL MANOMETRY  N/A 10/29/2015   Procedure: ESOPHAGEAL MANOMETRY (EM);  Surgeon: Iva Boop, MD;  Location: [facility];  Service: Endoscopy;  Laterality: N/A;  . KNEE SURGERY  2002/2003   bil  . RECTAL SURGERY  correction of prolapse   2010   Past Medical History:  Diagnosis Date  . Allergy   . Arthritis    HANDS,HIPS,KNEES  . Bronchitis, chronic/intermittent 01/22/2012  . Depression 01/22/2012  . GERD (gastroesophageal reflux disease)   . Hyperlipidemia   . IBS (irritable bowel syndrome) 01/22/2012  . Lupus (HCC)   . Neuromuscular disorder (HCC)   . Osteoporosis   . Seizures (HCC)   . SOB (shortness of breath)   . Thyroid  disease    Temp 98.9 F (37.2 C)   Ht 5' 7.5" (1.715 m)   Wt 221 lb (100.2 kg)   BMI 34.10 kg/m   Opioid Risk Score:   Fall Risk Score:  `1  Depression screen PHQ 2/9  Depression screen [facility] 2/9 02/18/2021 01/14/2021 11/20/2020 08/10/2020 05/11/2020 01/20/2020 10/25/2019  Decreased Interest 1 0 0 0 0 0 0  Down, Depressed, Hopeless 0 0 0 0 0 0 0  PHQ - 2 Score 1 0 0 0 0 0 0  Altered sleeping 0 - - 2 0 - -  Tired, decreased energy 0 - - 1 0 - -  Change in appetite 1 - - 1 0 - -  Feeling bad or failure about yourself  0 - - 0 0 - -  Trouble concentrating 0 - - 0 0 - -  Moving slowly or fidgety/restless 0 - - 0 0 - -  Suicidal thoughts 0 - - 0 0 - -  PHQ-9 Score 2 - - 4 0 - -  Difficult doing work/chores Not difficult at all - - - Not difficult at all - -  Some recent data might be hidden    Review of Systems  Constitutional: Negative.   HENT: Negative.   Eyes: Negative.   Respiratory: Negative.   Cardiovascular: Negative.   Gastrointestinal: Negative.   Endocrine: Negative.   Genitourinary: Negative.   Musculoskeletal: Positive for arthralgias and back pain.  Skin: Negative.   Allergic/Immunologic: Negative.   Neurological: Negative.   Hematological: Negative.   Psychiatric/Behavioral: Negative.   All other systems reviewed and are negative.      Objective:   Physical Exam Vitals and nursing note reviewed.  Constitutional:      Appearance: Normal appearance.  Cardiovascular:     Rate and Rhythm: Normal rate and regular rhythm.     Pulses: Normal pulses.     Heart sounds: Normal heart sounds.  Pulmonary:     Effort: Pulmonary effort is normal.     Breath sounds: Normal breath sounds.  Musculoskeletal:     Cervical back: Normal range of motion and neck supple.     Comments: Normal Muscle Bulk and Muscle Testing Reveals:  Upper Extremities: Decreased ROM 90 degrees  and Muscle Strength 5/5  Thoracic Paraspinal Tenderness: T-7-T-9 Left Greater Trochanter  Tenderness Lower Extremities: Full ROM and Muscle Strength 5/5 Arises from Table with ease Narrow Based Gait   Skin:    General: Skin is warm and dry.  Neurological:     Mental Status: She is alert and oriented to person, place, and time.  Psychiatric:        Mood and Affect: Mood normal.        Behavior: Behavior normal.  Assessment & Plan:  1.Chronic seizure disorder: No seizure's. Continuecurrent medication regimen withGabapentin. Neurology Following.03/18/2021. 2. Chronic muscle spasms, weakness with associated pain disorder: Continuecurrent medication regimen withBaclofen. Continue with Exercise regime.03/18/2021. 3. Chronic dysphagia:No complaints today. Continue to monitor.GI Following.03/18/2021. 4. Anxiety with depression : No complaints today. Stable. Continuecurrent medication regimen withElavil.03/18/2021. 5. Fibromyalgia/ Rib Pain/Chronic Pain:Continue Diclofenac: Continue with exercise and heat Therapy.03/18/2021. Refilled: oxyCODONE 7.5/325mg  onetablet 5 times daily as needed #140. Second script sent for the following month.Continue with slow weaning. We will continue the opioid monitoring program, this consists of regular clinic visits, examinations, urine drug screen, pill counts as well as use of [place] Controlled Substance Reporting system. A 12 month History has been reviewed on the [place] Controlled Substance Reporting System05/22/2022. 6. Peripheral Neuropathy:Continuecurrent medication regimen withGabapentin:03/18/2021.  F/U [id number]

## 2021-03-21 ENCOUNTER — Encounter: Payer: Self-pay | Admitting: Registered Nurse

## 2021-04-23 DIAGNOSIS — M8588 Other specified disorders of bone density and structure, other site: Secondary | ICD-10-CM | POA: Diagnosis not present

## 2021-04-23 DIAGNOSIS — Z1231 Encounter for screening mammogram for malignant neoplasm of breast: Secondary | ICD-10-CM | POA: Diagnosis not present

## 2021-04-25 ENCOUNTER — Other Ambulatory Visit: Payer: Self-pay | Admitting: Obstetrics and Gynecology

## 2021-04-25 DIAGNOSIS — R928 Other abnormal and inconclusive findings on diagnostic imaging of breast: Secondary | ICD-10-CM

## 2021-04-30 ENCOUNTER — Ambulatory Visit (INDEPENDENT_AMBULATORY_CARE_PROVIDER_SITE_OTHER): Payer: Medicare Other

## 2021-04-30 VITALS — Ht 68.0 in | Wt 220.0 lb

## 2021-04-30 DIAGNOSIS — Z0000 Encounter for general adult medical examination without abnormal findings: Secondary | ICD-10-CM

## 2021-04-30 DIAGNOSIS — R10819 Abdominal tenderness, unspecified site: Secondary | ICD-10-CM | POA: Insufficient documentation

## 2021-04-30 DIAGNOSIS — R141 Gas pain: Secondary | ICD-10-CM | POA: Insufficient documentation

## 2021-04-30 DIAGNOSIS — R142 Eructation: Secondary | ICD-10-CM | POA: Insufficient documentation

## 2021-04-30 DIAGNOSIS — R635 Abnormal weight gain: Secondary | ICD-10-CM | POA: Insufficient documentation

## 2021-04-30 NOTE — Progress Notes (Signed)
Subjective:   Autumn Davis is a 35 y.o. female who presents for an Initial Medicare Annual Wellness Visit.  Virtual Visit via Telephone Note  I connected with  Autumn Davis on 04/30/21 at  9:45 AM EDT by telephone and verified that I am speaking with the correct person using two identifiers.  Location: Patient: Home Provider: [facility] Persons participating in the virtual visit: patient/Nurse Health Advisor   I discussed the limitations, risks, security and privacy concerns of performing an evaluation and management service by telephone and the availability of in person appointments. The patient expressed understanding and agreed to proceed.  Interactive audio and video telecommunications were attempted between this nurse and patient, however failed, due to patient having technical difficulties OR patient did not have access to video capability.  We continued and completed visit with audio only.  Some vital signs may be absent or patient reported.   Caycee Wanat E Rykin Route, LPN   Review of Systems     Cardiac Risk Factors include: dyslipidemia;hypertension;obesity (BMI >30kg/m2);sedentary lifestyle     Objective:    Today's Vitals   04/30/21 0955  Weight: 220 lb (99.8 kg)  Height: 5\' 8"  (1.727 m)  PainSc: 7    Body mass index is 33.45 kg/m.  Advanced Directives 04/30/2021 09/11/2017 07/14/2017 04/16/2017 03/12/2017 01/09/2017 09/18/2016  Does Patient Have a Medical Advance Directive? No No No No No No No  Would patient like information on creating a medical advance directive? No - Patient declined - No - Patient declined - No - Patient declined - No - patient declined information  Pre-existing out of facility DNR order (yellow form or pink MOST form) - - - - - - -    Current Medications (verified) Outpatient Encounter Medications as of 04/30/2021  Medication Sig   acetaZOLAMIDE (DIAMOX) 250 MG tablet TAKE 1 TABLET EVERY MORNING, TAKE 1 TABLET AT 2PM, AND TAKE 2 TABLETSAT BEDTIME    amitriptyline (ELAVIL) 75 MG tablet Take 1 tablet (75 mg total) by mouth at bedtime.   atorvastatin (LIPITOR) 40 MG tablet TAKE ONE (1) TABLET EACH DAY   baclofen (LIORESAL) 20 MG tablet TAKE ONE TABLET FOUR TIMES DAILY   budesonide-formoterol (SYMBICORT) 160-4.5 MCG/ACT inhaler Inhale 2 puffs into the lungs in the morning and at bedtime.   Calcium-Magnesium-Vitamin D (CALCIUM 1200+D3 PO) Take 1 tablet by mouth daily.   cetirizine (ZYRTEC) 10 MG tablet Take 10 mg by mouth daily.   cyanocobalamin (,VITAMIN B-12,) 1000 MCG/ML injection INJECT 05/02/2021 IM EVERY 30 DAYS   diclofenac (VOLTAREN) 75 MG EC tablet TAKE ONE TABLET BY MOUTH TWICE DAILY   docusate sodium (COLACE) 100 MG capsule Take 100 mg by mouth 2 (two) times daily. PRN   furosemide (LASIX) 20 MG tablet Take 1 tablet (20 mg total) by mouth daily.   gabapentin (NEURONTIN) 600 MG tablet TAKE 1 TABLET 5 TIMES DAILY   L-Methylfolate 15 MG TABS Take 1 tablet (15 mg total) by mouth daily.   MAGNESIUM GLYCINATE PO Take 2 tablets by mouth daily.   metoprolol succinate (TOPROL-XL) 25 MG 24 hr tablet Take 1 tablet (25 mg total) by mouth daily.   montelukast (SINGULAIR) 10 MG tablet Take 1 tablet (10 mg total) by mouth at bedtime.   Multiple Vitamins-Minerals (ZINC PO) Take 1 tablet by mouth daily.   oxyCODONE-acetaminophen (PERCOCET) 7.5-325 MG tablet Take 1 tablet by mouth 5 (five) times daily as needed for moderate pain. No More Than 5 a day.   predniSONE (DELTASONE) 5 MG  tablet Take 1 tablet (5 mg total) by mouth every morning.   tamsulosin (FLOMAX) 0.4 MG CAPS capsule TAKE ONE (1) CAPSULE EACH DAY   VENTOLIN HFA 108 (90 Base) MCG/ACT inhaler USE 2 PUFFS EVERY 4 TO 6 HOURS AS NEEDED   vitamin E 180 MG (400 UNITS) capsule Take 1,000 Units by mouth daily.    Ascorbic Acid (VITAMIN C ADULT GUMMIES PO) Take 2 each by mouth daily. (Patient not taking: Reported on 04/30/2021)   Cholecalciferol (VITAMIN D3) 50000 units CAPS Take 50,000 Units by mouth once  a week. (Patient not taking: Reported on 04/30/2021)   Cyanocobalamin (B12 FAST DISSOLVE PO) Take by mouth 2 (two) times daily. (Patient not taking: Reported on 04/30/2021)   cycloSPORINE (RESTASIS) 0.05 % ophthalmic emulsion Place 1 drop into both eyes 2 (two) times daily. (Patient not taking: Reported on 04/30/2021)   EPINEPHRINE 0.3 mg/0.3 mL IJ SOAJ injection USE AS DIRECTED (Patient not taking: Reported on 04/30/2021)   ipratropium-albuterol (DUONEB) 0.5-2.5 (3) MG/3ML SOLN Take 3 mLs by nebulization every 4 (four) hours as needed. (Patient not taking: Reported on 04/30/2021)   nystatin (MYCOSTATIN) 100000 UNIT/ML suspension Use as directed 5 mLs (500,000 Units total) in the mouth or throat 4 (four) times daily. (Patient not taking: Reported on 04/30/2021)   nystatin-triamcinolone ointment (MYCOLOG) Apply 1 application topically 2 (two) times daily. (Patient not taking: Reported on 04/30/2021)   Omega 3 1000 MG CAPS Take 1 capsule by mouth daily. (Patient not taking: Reported on 04/30/2021)   promethazine (PHENERGAN) 12.5 MG tablet Take 1 tablet (12.5 mg total) by mouth every 8 (eight) hours as needed for nausea or vomiting. (Patient not taking: Reported on 04/30/2021)   QNASL 80 MCG/ACT AERS USE 2 SPRAYS IN EACH NOSTRIL DAILY (Patient not taking: Reported on 04/30/2021)   Vitamin D, Ergocalciferol, (DRISDOL) 1.25 MG (50000 UNIT) CAPS capsule 1 po on Monday and thursday (Patient not taking: Reported on 04/30/2021)   No facility-administered encounter medications on file as of 04/30/2021.    Allergies (verified) Ciprofloxacin, Compazine, and Prochlorperazine   History: Past Medical History:  Diagnosis Date   Allergy    Arthritis    HANDS,HIPS,KNEES   Bronchitis, chronic/intermittent 01/22/2012   Depression 01/22/2012   GERD (gastroesophageal reflux disease)    Hyperlipidemia    IBS (irritable bowel syndrome) 01/22/2012   Lupus (HCC)    Neuromuscular disorder (HCC)    Osteoporosis    Seizures (HCC)     SOB (shortness of breath)    Thyroid disease    Past Surgical History:  Procedure Laterality Date   71 HOUR PH STUDY N/A 10/29/2015   Procedure: 24 HOUR PH STUDY;  Surgeon: Iva Boop, MD;  Location: [facility];  Service: Endoscopy;  Laterality: N/A;   COLONOSCOPY     ESOPHAGEAL MANOMETRY N/A 10/29/2015   Procedure: ESOPHAGEAL MANOMETRY (EM);  Surgeon: Iva Boop, MD;  Location: [facility];  Service: Endoscopy;  Laterality: N/A;   KNEE SURGERY  2002/2003   bil   RECTAL SURGERY  correction of prolapse   2010   Family History  Problem Relation Age of Onset   Thyroid disease Mother    Colon polyps Father    Lung cancer Maternal Grandmother    Cancer Paternal Grandmother        colon/pancreatiec/lymphoma   Irritable bowel syndrome Brother    AAA (abdominal aortic aneurysm) Neg Hx    Social History   Socioeconomic History   Marital status: Legally Separated  Spouse name: Not on file   Number of children: 1   Years of education: Not on file   Highest education level: Not on file  Occupational History   Occupation: disabled    Employer: [employer]  Tobacco Use   Smoking status: Never   Smokeless tobacco: Never  Vaping Use   Vaping Use: Never used  Substance and Sexual Activity   Alcohol use: No    Alcohol/week: 0.0 standard drinks   Drug use: No   Sexual activity: Not on file  Other Topics Concern   Not on file  Social History Narrative   She lives alone with her 66 year old son, but they stay with her parents a lot   Social Determinants of Health   Financial Resource Strain: Low Risk    Difficulty of Paying Living Expenses: Not hard at all  Food Insecurity: No Food Insecurity   Worried About Programme researcher, broadcasting/film/video in the Last Year: Never true   Barista in the Last Year: Never true  Transportation Needs: No Transportation Needs   Lack of Transportation (Medical): No   Lack of Transportation (Non-Medical): No  Physical Activity:  Inactive   Days of Exercise per Week: 0 days   Minutes of Exercise per Session: 0 min  Stress: No Stress Concern Present   Feeling of Stress : Not at all  Social Connections: Unknown   Frequency of Communication with Friends and Family: More than three times a week   Frequency of Social Gatherings with Friends and Family: More than three times a week   Attends Religious Services: Not on Scientist, clinical (histocompatibility and immunogenetics) or Organizations: Not on file   Attends Banker Meetings: Not on file   Marital Status: Separated    Tobacco Counseling Counseling given: Not Answered   Clinical Intake:  Pre-visit preparation completed: Yes  Pain : 0-10 Pain Score: 7  Pain Type: Chronic pain Pain Location: Generalized Pain Descriptors / Indicators: Aching, Sore Pain Onset: More than a month ago Pain Frequency: Intermittent     BMI - recorded: 33.45 Nutritional Status: BMI > 30  Obese Nutritional Risks: None Diabetes: No  How often do you need to have someone help you when you read instructions, pamphlets, or other written materials from your doctor or pharmacy?: 1 - Never  Diabetic?No  Interpreter Needed?: No  Information entered by :: Domonick Sittner, LPN   Activities of Daily Living In your present state of health, do you have any difficulty performing the following activities: 04/30/2021  Hearing? N  Vision? N  Difficulty concentrating or making decisions? N  Walking or climbing stairs? Y  Dressing or bathing? N  Doing errands, shopping? N  Preparing Food and eating ? N  Using the Toilet? N  In the past six months, have you accidently leaked urine? Y  Do you have problems with loss of bowel control? Y  Managing your Medications? N  Managing your Finances? N  Housekeeping or managing your Housekeeping? N  Some recent data might be hidden    Patient Care Team: [name], FNP as PCP - General (Nurse Practitioner) Charna Elizabeth, MD as Attending Physician  (Gastroenterology) Antonietta Breach, MD as Referring Physician (Neurology) Coralyn Helling, MD as Consulting Physician (Pulmonary Disease) Candice Camp, MD as Consulting Physician (Obstetrics and Gynecology)  Indicate any recent Medical Services you may have received from other than [facility] providers in the past year (date may be approximate).  Assessment:   This is a routine wellness examination for [place].  Hearing/Vision screen Hearing Screening - Comments:: Denies hearing difficulties  Vision Screening - Comments:: Wears eyeglasses - up to date with annual eye exam with Dr Conley RollsLe  Dietary issues and exercise activities discussed: Current Exercise Habits: The patient does not participate in regular exercise at present, Exercise limited by: orthopedic condition(s);neurologic condition(s)   Goals Addressed             This Visit's Progress    Patient Stated       Would like to start exercising again, lose weight and feel better        Depression Screen PHQ 2/9 Scores 04/30/2021 02/18/2021 01/14/2021 11/20/2020 08/10/2020 05/11/2020 01/20/2020  PHQ - 2 Score 0 1 0 0 0 0 0  PHQ- 9 Score - 2 - - 4 0 -    Fall Risk Fall Risk  04/30/2021 01/14/2021 11/20/2020 11/05/2020 09/11/2020  Falls in the past year? 0 0 0 0 0  Comment - - - - -  Number falls in past yr: 0 0 - - -  Comment - - - - -  Injury with Fall? 0 0 - - -  Comment - - - - -  Risk Factor Category  - - - - -  Risk for fall due to : Medication side effect;Impaired balance/gait;Orthopedic patient;Impaired vision - - - -  Follow up Education provided;Falls prevention discussed - - - -  Comment - - - - -    FALL RISK PREVENTION PERTAINING TO THE HOME:  Any stairs in or around the home? Yes  If so, are there any without handrails? No  Home free of loose throw rugs in walkways, pet beds, electrical cords, etc? Yes  Adequate lighting in your home to reduce risk of falls? Yes   ASSISTIVE DEVICES UTILIZED TO PREVENT FALLS:  Life  alert? No  Use of a cane, walker or w/c? Yes  Grab bars in the bathroom? Yes  Shower chair or bench in shower? Yes  Elevated toilet seat or a handicapped toilet? Yes   TIMED UP AND GO:  Was the test performed? No . Telephonic visit  Cognitive Function: Normal cognitive status assessed by direct observation by this Nurse Health Advisor. No abnormalities found.         Immunizations Immunization History  Administered Date(s) Administered   Influenza Split 08/27/2012   Influenza,inj,Quad PF,6+ Mos 08/29/2013, 08/31/2014, 09/27/2015, 09/12/2016, 09/23/2017, 09/21/2018, 09/26/2019, 11/26/2020   Pneumococcal Conjugate-13 08/31/2014   Pneumococcal Polysaccharide-23 08/29/2013   Tdap 02/23/2015    TDAP status: Up to date  Flu Vaccine status: Up to date  Pneumococcal vaccine status: Up to date  Covid-19 vaccine status: Declined, Education has been provided regarding the importance of this vaccine but patient still declined. Advised may receive this vaccine at local pharmacy or Health Dept.or vaccine clinic. Aware to provide a copy of the vaccination record if obtained from local pharmacy or Health Dept. Verbalized acceptance and understanding.  Qualifies for Shingles Vaccine? No   Zostavax completed No   Shingrix Completed?: No.    Education has been provided regarding the importance of this vaccine. Patient has been advised to call insurance company to determine out of pocket expense if they have not yet received this vaccine. Advised may also receive vaccine at local pharmacy or Health Dept. Verbalized acceptance and understanding.  Screening Tests Health Maintenance  Topic Date Due   COVID-19 Vaccine (1) Never done   Pneumococcal Vaccine  17-8 Years old (1 - PCV) Never done   Zoster Vaccines- Shingrix (1 of 2) Never done   Hepatitis C Screening  08/10/2021 (Originally 01/13/2002)   INFLUENZA VACCINE  06/17/2021   PAP SMEAR-Modifier  09/26/2022   TETANUS/TDAP  02/22/2025   HIV  Screening  Completed   HPV VACCINES  Aged Out    Health Maintenance  Health Maintenance Due  Topic Date Due   COVID-19 Vaccine (1) Never done   Pneumococcal Vaccine 1-41 Years old (1 - PCV) Never done   Zoster Vaccines- Shingrix (1 of 2) Never done    Colorectal cancer screening: Type of screening: Colonoscopy. Completed 2008. Repeat every 10 years Per last colonoscopy, she should do annual FOBT, then start colonoscopy again at age 68  Mammogram status: Completed 03/2021. Repeat every year This was abnormal - she has f/u appt for biopsy and repeat mammo next week  Bone Density status: Completed 07/24/2016. Results reflect: Bone density results: OSTEOPOROSIS. Repeat every 2 years.  Lung Cancer Screening: (Low Dose CT Chest recommended if Age 33-80 years, 30 pack-year currently smoking OR have quit w/in 15years.) does not qualify.   Additional Screening:  Hepatitis C Screening: does not qualify  Vision Screening: Recommended annual ophthalmology exams for early detection of glaucoma and other disorders of the eye. Is the patient up to date with their annual eye exam?  Yes  Who is the provider or what is the name of the office in which the patient attends annual eye exams? Dr Conley Rolls in [place] If pt is not established with a provider, would they like to be referred to a provider to establish care? No .   Dental Screening: Recommended annual dental exams for proper oral hygiene  Community Resource Referral / Chronic Care Management: CRR required this visit?  No   CCM required this visit?  No      Plan:     I have personally reviewed and noted the following in the patient's chart:   Medical and social history Use of alcohol, tobacco or illicit drugs  Current medications and supplements including opioid prescriptions. Patient is currently taking opioid prescriptions. Information provided to patient regarding non-opioid alternatives. Patient advised to discuss non-opioid treatment  plan with their provider. Functional ability and status Nutritional status Physical activity Advanced directives List of other physicians Hospitalizations, surgeries, and ER visits in previous 12 months Vitals Screenings to include cognitive, depression, and falls Referrals and appointments  In addition, I have reviewed and discussed with patient certain preventive protocols, quality metrics, and best practice recommendations. A written personalized care plan for preventive services as well as general preventive health recommendations were provided to patient.     [place] Constable, LPN   [id number]   Nurse Notes: None

## 2021-04-30 NOTE — Patient Instructions (Signed)
Autumn Davis , Thank you for taking time to come for your Medicare Wellness Visit. I appreciate your ongoing commitment to your health goals. Please review the following plan we discussed and let me know if I can assist you in the future.   Screening recommendations/referrals: Colonoscopy: Done 2008 - Start doing annual FOBT, then colonoscopy at age 35 Mammogram: Done 03/2021, repeat tests next week Bone Density: Done 2017 - Repeat every 2 years - Discuss with Dr Corinna Capra Recommended yearly ophthalmology/optometry visit for glaucoma screening and checkup Recommended yearly dental visit for hygiene and checkup  Vaccinations: Influenza vaccine: Done 11/26/2020 - Repeat annually Pneumococcal vaccine: Done 08/29/2013 & 08/31/2014 Tdap vaccine: Done 02/23/2015 - Repeat in 10 years Shingles vaccine: Due at age 89 if specialists recommend  Covid-19: Due if specialists recommend  Advanced directives: Please bring a copy of your health care power of attorney and living will to the office to be added to your chart at your convenience.  Conditions/risks identified: Aim for 30 minutes of exercise or brisk walking each day, drink 6-8 glasses of water and eat lots of fruits and vegetables.  Next appointment: Follow up in one year for your annual wellness visit.   Preventive Care 35-64 Years, Female Preventive care refers to lifestyle choices and visits with your health care provider that can promote health and wellness. What does preventive care include? A yearly physical exam. This is also called an annual well check. Dental exams once or twice a year. Routine eye exams. Ask your health care provider how often you should have your eyes checked. Personal lifestyle choices, including: Daily care of your teeth and gums. Regular physical activity. Eating a healthy diet. Avoiding tobacco and drug use. Limiting alcohol use. Practicing safe sex. Taking low-dose aspirin daily starting at age 10. Taking vitamin  and mineral supplements as recommended by your health care provider. What happens during an annual well check? The services and screenings done by your health care provider during your annual well check will depend on your age, overall health, lifestyle risk factors, and family history of disease. Counseling  Your health care provider may ask you questions about your: Alcohol use. Tobacco use. Drug use. Emotional well-being. Home and relationship well-being. Sexual activity. Eating habits. Work and work Statistician. Method of birth control. Menstrual cycle. Pregnancy history. Screening  You may have the following tests or measurements: Height, weight, and BMI. Blood pressure. Lipid and cholesterol levels. These may be checked every 5 years, or more frequently if you are over 62 years old. Skin check. Lung cancer screening. You may have this screening every year starting at age 57 if you have a 30-pack-year history of smoking and currently smoke or have quit within the past 15 years. Fecal occult blood test (FOBT) of the stool. You may have this test every year starting at age 30. Flexible sigmoidoscopy or colonoscopy. You may have a sigmoidoscopy every 5 years or a colonoscopy every 10 years starting at age 24. Hepatitis C blood test. Hepatitis B blood test. Sexually transmitted disease (STD) testing. Diabetes screening. This is done by checking your blood sugar (glucose) after you have not eaten for a while (fasting). You may have this done every 1-3 years. Mammogram. This may be done every 1-2 years. Talk to your health care provider about when you should start having regular mammograms. This may depend on whether you have a family history of breast cancer. BRCA-related cancer screening. This may be done if you have a family history  of breast, ovarian, tubal, or peritoneal cancers. Pelvic exam and Pap test. This may be done every 3 years starting at age 73. Starting at age 20, this  may be done every 5 years if you have a Pap test in combination with an HPV test. Bone density scan. This is done to screen for osteoporosis. You may have this scan if you are at high risk for osteoporosis. Discuss your test results, treatment options, and if necessary, the need for more tests with your health care provider. Vaccines  Your health care provider may recommend certain vaccines, such as: Influenza vaccine. This is recommended every year. Tetanus, diphtheria, and acellular pertussis (Tdap, Td) vaccine. You may need a Td booster every 10 years. Zoster vaccine. You may need this after age 68. Pneumococcal 13-valent conjugate (PCV13) vaccine. You may need this if you have certain conditions and were not previously vaccinated. Pneumococcal polysaccharide (PPSV23) vaccine. You may need one or two doses if you smoke cigarettes or if you have certain conditions. Talk to your health care provider about which screenings and vaccines you need and how often you need them. This information is not intended to replace advice given to you by your health care provider. Make sure you discuss any questions you have with your health care provider. Document Released: 11/30/2015 Document Revised: 07/23/2016 Document Reviewed: 09/04/2015 Elsevier Interactive Patient Education  2017 [place] Prevention in the Home Falls can cause injuries. They can happen to people of all ages. There are many things you can do to make your home safe and to help prevent falls. What can I do on the outside of my home? Regularly fix the edges of walkways and driveways and fix any cracks. Remove anything that might make you trip as you walk through a door, such as a raised step or threshold. Trim any bushes or trees on the path to your home. Use bright outdoor lighting. Clear any walking paths of anything that might make someone trip, such as rocks or tools. Regularly check to see if handrails are loose or  broken. Make sure that both sides of any steps have handrails. Any raised decks and porches should have guardrails on the edges. Have any leaves, snow, or ice cleared regularly. Use sand or salt on walking paths during winter. Clean up any spills in your garage right away. This includes oil or grease spills. What can I do in the bathroom? Use night lights. Install grab bars by the toilet and in the tub and shower. Do not use towel bars as grab bars. Use non-skid mats or decals in the tub or shower. If you need to sit down in the shower, use a plastic, non-slip stool. Keep the floor dry. Clean up any water that spills on the floor as soon as it happens. Remove soap buildup in the tub or shower regularly. Attach bath mats securely with double-sided non-slip rug tape. Do not have throw rugs and other things on the floor that can make you trip. What can I do in the bedroom? Use night lights. Make sure that you have a light by your bed that is easy to reach. Do not use any sheets or blankets that are too big for your bed. They should not hang down onto the floor. Have a firm chair that has side arms. You can use this for support while you get dressed. Do not have throw rugs and other things on the floor that can make you trip.  What can I do in the kitchen? Clean up any spills right away. Avoid walking on wet floors. Keep items that you use a lot in easy-to-reach places. If you need to reach something above you, use a strong step stool that has a grab bar. Keep electrical cords out of the way. Do not use floor polish or wax that makes floors slippery. If you must use wax, use non-skid floor wax. Do not have throw rugs and other things on the floor that can make you trip. What can I do with my stairs? Do not leave any items on the stairs. Make sure that there are handrails on both sides of the stairs and use them. Fix handrails that are broken or loose. Make sure that handrails are as long as  the stairways. Check any carpeting to make sure that it is firmly attached to the stairs. Fix any carpet that is loose or worn. Avoid having throw rugs at the top or bottom of the stairs. If you do have throw rugs, attach them to the floor with carpet tape. Make sure that you have a light switch at the top of the stairs and the bottom of the stairs. If you do not have them, ask someone to add them for you. What else can I do to help prevent falls? Wear shoes that: Do not have high heels. Have rubber bottoms. Are comfortable and fit you well. Are closed at the toe. Do not wear sandals. If you use a stepladder: Make sure that it is fully opened. Do not climb a closed stepladder. Make sure that both sides of the stepladder are locked into place. Ask someone to hold it for you, if possible. Clearly mark and make sure that you can see: Any grab bars or handrails. First and last steps. Where the edge of each step is. Use tools that help you move around (mobility aids) if they are needed. These include: Canes. Walkers. Scooters. Crutches. Turn on the lights when you go into a dark area. Replace any light bulbs as soon as they burn out. Set up your furniture so you have a clear path. Avoid moving your furniture around. If any of your floors are uneven, fix them. If there are any pets around you, be aware of where they are. Review your medicines with your doctor. Some medicines can make you feel dizzy. This can increase your chance of falling. Ask your doctor what other things that you can do to help prevent falls. This information is not intended to replace advice given to you by your health care provider. Make sure you discuss any questions you have with your health care provider. Document Released: 08/30/2009 Document Revised: 04/10/2016 Document Reviewed: 12/08/2014 Elsevier Interactive Patient Education  2017 [employer]  Managing Pain Without Opioids Opioids are strong medicines  used to treat moderate to severe pain. For some people, especially those who have long-term (chronic) pain, opioids may not be the best choice for pain management due to: Side effects like nausea, constipation, and sleepiness. The risk of addiction (opioid use disorder). The longer you take opioids, the greater your risk of addiction. Pain that lasts for more than 3 months is called chronic pain. Managing chronic pain usually requires more than one approach and is often provided by a team of health care providers working together (multidisciplinary approach). Pain management may be done at a pain management center or pain clinic. Types of pain management without opioids Managing pain without opioids can involve: Non-opioid  medicines. Exercises to help relieve pain and improve strength and range of motion (physical therapy). Therapy to help with everyday tasks and activities (occupational therapy). Therapy to help you find ways to relieve pain by doing things you enjoy (recreational therapy). Talk therapy (psychotherapy) and other mental health therapies. Medical treatments such as injections or devices. Making lifestyle changes. Pain management options Non-opioid medicines Non-opioid medicines for pain may include medicines taken by mouth (oral medicines), such as: Over-the-counter or prescription NSAIDs. These may be the first medicines used for pain. They work well for muscle and bone pain, and they reduce swelling. Acetaminophen. This over-the-counter medicine may work well for milder pain but not swelling. Antidepressants. These may be used to treat chronic pain. A certain type of antidepressant (tricyclics) is often used. These medicines are given in lower doses for pain than when used for depression. Anticonvulsants. These are usually used to treat seizures but may also reduce nerve (neuropathic) pain. Muscle relaxants. These relieve pain caused by sudden muscle tightening (spasms). You  may also use a type of pain medicine that is applied to the skin as a patch, cream, or gel (topical analgesic), such as a numbing medicine. These may cause fewer side effects than oralmedicines. Therapy Physical therapy involves doing exercises to gain strength and flexibility. A physical therapist may teach you exercises to move and stretch parts of your body that are weak, stiff, or painful. You can learn these exercises at physical therapy visits and practice them at home. Physical therapy may also involve: Massage. Heat wraps or applying heat or cold to affected areas. Sending electrical signals through the skin to interrupt pain signals (transcutaneous electrical nerve stimulation, TENS). Sending weak lasers through the skin to reduce pain and swelling (low-level laser therapy). Using signals from your body to help you learn to regulate pain (biofeedback). Occupational therapy helps you learn ways to function at home and work withless pain. Recreational therapy may involve trying new activities or hobbies, such asdrawing or a physical activity. Types of mental health therapy for pain include: Cognitive behavioral therapy (CBT) to help you learn coping skills for dealing with pain. Acceptance and commitment therapy (ACT) to change the way you think and react to pain. Relaxation therapies, including muscle relaxation exercises and focusing your mind on the present moment to lower stress (mindfulness-based stress reduction). Pain management counseling. This may be individual, family, or group counseling.  Medical treatments Medical treatments for pain management include: Nerve block injections. These may include a pain blocker and anti-inflammatory medicines. You may have injections: Near the spine to relieve chronic back or neck pain. Into joints to relieve back or joint pain. Into nerve areas that supply a painful area to relieve body pain. Into muscles (trigger point injections) to relieve  some painful muscle conditions. A medical device placed near your spine to help block pain signals and relieve nerve pain or chronic back pain (spinal cord stimulation device). Acupuncture. Follow these instructions at home Medicines Take over-the-counter and prescription medicines only as told by your health care provider. If you are taking pain medicine, ask your health care providers about possible side effects to watch out for. Do not drive or use heavy machinery while taking prescription pain medicine. Lifestyle  Do not use drugs or alcohol to reduce pain. Limit alcohol intake to no more than 1 drink a day for nonpregnant women and 2 drinks a day for men. One drink equals 12 oz of beer, 5 oz of wine, or 1 oz  of hard liquor. Do not use any products that contain nicotine or tobacco, such as cigarettes and e-cigarettes. These can delay healing. If you need help quitting, ask your health care provider. Eat a healthy diet and maintain a healthy weight. Poor diet and excess weight may make pain worse. Eat foods that are high in fiber. These include fresh fruits and vegetables, whole grains, and beans. Limit foods that are high in fat and processed sugars, such as fried and sweet foods. Exercise regularly. Exercise lowers stress and may help relieve pain. Ask your health care provider what activities and exercises are safe for you. If your health care provider approves, join an exercise class that combines movement and stress reduction. Examples include yoga and tai chi. Get enough sleep. Lack of sleep may make pain worse. Lower stress as much as possible. Practice stress reduction techniques as told by your therapist.  General instructions Work with all your pain management providers to find the treatments that work best for you. You are an important member of your pain management team. There are many things you can do to reduce pain on your own. Consider joining an online or in-person support  group for people who have chronic pain. Keep all follow-up visits as told by your health care providers. This is important. Where to find more information You can find more information about managing pain without opioids from: American Academy of Pain Medicine: painmed.[place] [facility]: instituteforchronicpain.org American Chronic Pain Association: theacpa.org Contact a health care provider if: You have side effects from pain medicine. Your pain gets worse or does not get better with treatments or home care. You are struggling with anxiety or depression. Summary Many types of pain can be managed without opioids. Chronic pain may respond better to pain management without opioids. Pain is best managed with a team of providers working together. Pain management without opioids may include non-opioid medicines, medical treatments, physical therapy, mental health therapy, and lifestyle changes. Tell your health care providers if your pain gets worse or is not being managed well enough. This information is not intended to replace advice given to you by your health care provider. Make sure you discuss any questions you have with your healthcare provider. Document Revised: 08/14/2020 Document Reviewed: 08/16/2020 Elsevier Patient Education  [place].

## 2021-05-02 ENCOUNTER — Other Ambulatory Visit: Payer: Self-pay | Admitting: Nurse Practitioner

## 2021-05-07 ENCOUNTER — Ambulatory Visit
Admission: RE | Admit: 2021-05-07 | Discharge: 2021-05-07 | Disposition: A | Discharge status: Home / Self Care | Payer: Medicare Other | Source: Ambulatory Visit | Attending: Obstetrics and Gynecology | Admitting: Obstetrics and Gynecology

## 2021-05-07 ENCOUNTER — Ambulatory Visit: Payer: Medicare Other

## 2021-05-07 ENCOUNTER — Other Ambulatory Visit: Payer: Self-pay

## 2021-05-07 ENCOUNTER — Other Ambulatory Visit: Payer: Self-pay | Admitting: Obstetrics and Gynecology

## 2021-05-07 DIAGNOSIS — N6489 Other specified disorders of breast: Secondary | ICD-10-CM | POA: Diagnosis not present

## 2021-05-07 DIAGNOSIS — R928 Other abnormal and inconclusive findings on diagnostic imaging of breast: Secondary | ICD-10-CM

## 2021-05-07 DIAGNOSIS — R922 Inconclusive mammogram: Secondary | ICD-10-CM | POA: Diagnosis not present

## 2021-05-23 ENCOUNTER — Encounter: Payer: Medicare Other | Admitting: Registered Nurse

## 2021-05-24 ENCOUNTER — Encounter: Payer: Self-pay | Admitting: Nurse Practitioner

## 2021-05-24 ENCOUNTER — Ambulatory Visit (INDEPENDENT_AMBULATORY_CARE_PROVIDER_SITE_OTHER): Payer: Medicare Other | Admitting: Nurse Practitioner

## 2021-05-24 ENCOUNTER — Other Ambulatory Visit: Payer: Self-pay

## 2021-05-24 VITALS — BP 124/87 | HR 80 | Temp 98.6°F | Resp 20 | Ht 68.0 in | Wt 220.0 lb

## 2021-05-24 DIAGNOSIS — K581 Irritable bowel syndrome with constipation: Secondary | ICD-10-CM

## 2021-05-24 DIAGNOSIS — I479 Paroxysmal tachycardia, unspecified: Secondary | ICD-10-CM

## 2021-05-24 DIAGNOSIS — R350 Frequency of micturition: Secondary | ICD-10-CM

## 2021-05-24 DIAGNOSIS — M329 Systemic lupus erythematosus, unspecified: Secondary | ICD-10-CM

## 2021-05-24 DIAGNOSIS — M797 Fibromyalgia: Secondary | ICD-10-CM

## 2021-05-24 DIAGNOSIS — R609 Edema, unspecified: Secondary | ICD-10-CM

## 2021-05-24 DIAGNOSIS — D8989 Other specified disorders involving the immune mechanism, not elsewhere classified: Secondary | ICD-10-CM

## 2021-05-24 DIAGNOSIS — Z6833 Body mass index (BMI) 33.0-33.9, adult: Secondary | ICD-10-CM

## 2021-05-24 DIAGNOSIS — E782 Mixed hyperlipidemia: Secondary | ICD-10-CM

## 2021-05-24 DIAGNOSIS — E559 Vitamin D deficiency, unspecified: Secondary | ICD-10-CM | POA: Diagnosis not present

## 2021-05-24 DIAGNOSIS — R569 Unspecified convulsions: Secondary | ICD-10-CM

## 2021-05-24 DIAGNOSIS — R1319 Other dysphagia: Secondary | ICD-10-CM | POA: Diagnosis not present

## 2021-05-24 DIAGNOSIS — E079 Disorder of thyroid, unspecified: Secondary | ICD-10-CM

## 2021-05-24 DIAGNOSIS — K219 Gastro-esophageal reflux disease without esophagitis: Secondary | ICD-10-CM

## 2021-05-24 DIAGNOSIS — R6889 Other general symptoms and signs: Secondary | ICD-10-CM | POA: Diagnosis not present

## 2021-05-24 DIAGNOSIS — F3342 Major depressive disorder, recurrent, in full remission: Secondary | ICD-10-CM

## 2021-05-24 MED ORDER — FUROSEMIDE 20 MG PO TABS: 20.0000 mg | ORAL_TABLET | Freq: Every day | ORAL | 1 refills | Status: DC

## 2021-05-24 MED ORDER — TAMSULOSIN HCL 0.4 MG PO CAPS
ORAL_CAPSULE | ORAL | 1 refills | Status: DC
Start: 2021-05-24 — End: 2021-11-25

## 2021-05-24 MED ORDER — METOPROLOL SUCCINATE ER 25 MG PO TB24: 25.0000 mg | ORAL_TABLET | Freq: Every day | ORAL | 1 refills | Status: DC

## 2021-05-24 MED ORDER — ACETAZOLAMIDE 250 MG PO TABS
ORAL_TABLET | ORAL | 1 refills | Status: DC
Start: 2021-05-24 — End: 2021-11-25

## 2021-05-24 MED ORDER — ATORVASTATIN CALCIUM 40 MG PO TABS: ORAL_TABLET | ORAL | 1 refills | Status: DC

## 2021-05-24 MED ORDER — AMITRIPTYLINE HCL 75 MG PO TABS: 75.0000 mg | ORAL_TABLET | Freq: Every day | ORAL | 3 refills | Status: DC

## 2021-05-24 NOTE — Patient Instructions (Signed)

## 2021-05-24 NOTE — Addendum Note (Signed)
Addended by: [name] on: 05/24/2021 02:43 PM   Modules accepted: Orders

## 2021-05-24 NOTE — Progress Notes (Addendum)
Subjective:    Patient ID: Autumn Davis, female    DOB: [birth year], 35 y.o.   MRN: [id number]   Chief Complaint: medical management of chronic issues     HPI:  1. Tachycardia, paroxysmal (HCC) No c/o palpitations or heart racing  2. Esophageal dysphagia No issues  3. Gastroesophageal reflux disease without esophagitis Is not  on daily medications will use OTC meds as needed.  4. Irritable bowel syndrome with constipation Doing good right now. Has been regular as of late. She does take cloase almost daily.  5. Thyroid disease No problems that she is aware of. Lab Results  Component Value Date   TSH 1.790 02/18/2021    6. Recurrent major depressive disorder, in full remission (HCC) Is currently not on an antidepressant. Sh ehas been doing well since her and her husband separated. Depression screen [facility] 2/9 05/24/2021 04/30/2021 02/18/2021  Decreased Interest 1 0 1  Down, Depressed, Hopeless 0 0 0  PHQ - 2 Score 1 0 1  Altered sleeping 1 - 0  Tired, decreased energy 1 - 0  Change in appetite 0 - 1  Feeling bad or failure about yourself  0 - 0  Trouble concentrating 0 - 0  Moving slowly or fidgety/restless 0 - 0  Suicidal thoughts 0 - 0  PHQ-9 Score 3 - 2  Difficult doing work/chores Not difficult at all - Not difficult at all  Some recent data might be hidden      7. Fibromyalgia Sees pain management- has pain daily but is able to do what she needs to do  8. Mixed hyperlipidemia Does try to watch diet and does try to do some exercise several times a week. ' Lab Results  Component Value Date   CHOL 210 (H) 02/18/2021   HDL 62 02/18/2021   LDLCALC 109 (H) 02/18/2021   TRIG 227 (H) 02/18/2021   CHOLHDL 3.4 02/18/2021     9. Inflammatory autoimmune disorder (HCC) Sees specialist every 6 months. Still not sure whay auto immune disorder she has.   10. Systemic lupus erythematosus, unspecified SLE type, unspecified organ involvement status (HCC) Nit  100% sure she has lupus. Labs have been inconclusive in the past.  11. Seizures (HCC) No recent seizure activity. Is on diamox daily. Sees neurologist every 6 months  12. Vitamin D deficiency Is on daily vitamin d supplement  13. BMI 33.0-33.9,adult No recent weight changes Wt Readings from Last 3 Encounters:  05/24/21 220 lb (99.8 kg)  04/30/21 220 lb (99.8 kg)  03/18/21 221 lb (100.2 kg)   BMI Readings from Last 3 Encounters:  05/24/21 33.45 kg/m  04/30/21 33.45 kg/m  03/18/21 34.10 kg/m       Outpatient Encounter Medications as of 05/24/2021  Medication Sig   acetaZOLAMIDE (DIAMOX) 250 MG tablet TAKE 1 TABLET EVERY MORNING, TAKE 1 TABLET AT 2PM, AND TAKE 2 TABLETSAT BEDTIME   amitriptyline (ELAVIL) 75 MG tablet Take 1 tablet (75 mg total) by mouth at bedtime.   Ascorbic Acid (VITAMIN C ADULT GUMMIES PO) Take 2 each by mouth daily. (Patient not taking: Reported on 04/30/2021)   atorvastatin (LIPITOR) 40 MG tablet TAKE ONE (1) TABLET EACH DAY   baclofen (LIORESAL) 20 MG tablet TAKE ONE TABLET FOUR TIMES DAILY   budesonide-formoterol (SYMBICORT) 160-4.5 MCG/ACT inhaler Inhale 2 puffs into the lungs in the morning and at bedtime.   Calcium-Magnesium-Vitamin D (CALCIUM 1200+D3 PO) Take 1 tablet by mouth daily.   cetirizine (ZYRTEC) 10 MG  tablet Take 10 mg by mouth daily.   Cholecalciferol (VITAMIN D3) 50000 units CAPS Take 50,000 Units by mouth once a week. (Patient not taking: Reported on 04/30/2021)   cyanocobalamin (,VITAMIN B-12,) 1000 MCG/ML injection INJECT IM EVERY 30 DAYS   Cyanocobalamin (B12 FAST DISSOLVE PO) Take by mouth 2 (two) times daily. (Patient not taking: Reported on 04/30/2021)   cycloSPORINE (RESTASIS) 0.05 % ophthalmic emulsion Place 1 drop into both eyes 2 (two) times daily. (Patient not taking: Reported on 04/30/2021)   diclofenac (VOLTAREN) 75 MG EC tablet TAKE ONE TABLET BY MOUTH TWICE DAILY   docusate sodium (COLACE) 100 MG capsule Take 100 mg by mouth  2 (two) times daily. PRN   EPINEPHRINE 0.3 mg/0.3 mL IJ SOAJ injection USE AS DIRECTED (Patient not taking: Reported on 04/30/2021)   furosemide (LASIX) 20 MG tablet Take 1 tablet (20 mg total) by mouth daily.   gabapentin (NEURONTIN) 600 MG tablet TAKE 1 TABLET 5 TIMES DAILY   ipratropium-albuterol (DUONEB) 0.5-2.5 (3) MG/3ML SOLN Take 3 mLs by nebulization every 4 (four) hours as needed. (Patient not taking: Reported on 04/30/2021)   L-Methylfolate 15 MG TABS Take 1 tablet (15 mg total) by mouth daily.   MAGNESIUM GLYCINATE PO Take 2 tablets by mouth daily.   metoprolol succinate (TOPROL-XL) 25 MG 24 hr tablet Take 1 tablet (25 mg total) by mouth daily.   montelukast (SINGULAIR) 10 MG tablet Take 1 tablet (10 mg total) by mouth at bedtime.   Multiple Vitamins-Minerals (ZINC PO) Take 1 tablet by mouth daily.   nystatin (MYCOSTATIN) 100000 UNIT/ML suspension Use as directed 5 mLs (500,000 Units total) in the mouth or throat 4 (four) times daily. (Patient not taking: Reported on 04/30/2021)   nystatin-triamcinolone ointment (MYCOLOG) Apply 1 application topically 2 (two) times daily. (Patient not taking: Reported on 04/30/2021)   Omega 3 1000 MG CAPS Take 1 capsule by mouth daily. (Patient not taking: Reported on 04/30/2021)   oxyCODONE-acetaminophen (PERCOCET) 7.5-325 MG tablet Take 1 tablet by mouth 5 (five) times daily as needed for moderate pain. No More Than 5 a day.   predniSONE (DELTASONE) 5 MG tablet Take 1 tablet (5 mg total) by mouth every morning.   promethazine (PHENERGAN) 12.5 MG tablet Take 1 tablet (12.5 mg total) by mouth every 8 (eight) hours as needed for nausea or vomiting. (Patient not taking: Reported on 04/30/2021)   QNASL 80 MCG/ACT AERS USE 2 SPRAYS IN EACH NOSTRIL DAILY (Patient not taking: Reported on 04/30/2021)   tamsulosin (FLOMAX) 0.4 MG CAPS capsule TAKE ONE (1) CAPSULE EACH DAY   VENTOLIN HFA 108 (90 Base) MCG/ACT inhaler USE 2 PUFFS EVERY 4 TO 6 HOURS AS NEEDED   Vitamin  D, Ergocalciferol, (DRISDOL) 1.25 MG (50000 UNIT) CAPS capsule 1 po on Davis and thursday (Patient not taking: Reported on 04/30/2021)   vitamin E 180 MG (400 UNITS) capsule Take 1,000 Units by mouth daily.    No facility-administered encounter medications on file as of 05/24/2021.    Past Surgical History:  Procedure Laterality Date   71 HOUR PH STUDY N/A 10/29/2015   Procedure: 24 HOUR PH STUDY;  Surgeon: Iva Boop, MD;  Location: [facility];  Service: Endoscopy;  Laterality: N/A;   COLONOSCOPY     ESOPHAGEAL MANOMETRY N/A 10/29/2015   Procedure: ESOPHAGEAL MANOMETRY (EM);  Surgeon: Iva Boop, MD;  Location: [facility];  Service: Endoscopy;  Laterality: N/A;   KNEE SURGERY  2002/2003   bil   RECTAL  SURGERY  correction of prolapse   2010    Family History  Problem Relation Age of Onset   Thyroid disease Mother    Colon polyps Father    Lung cancer Maternal Grandmother    Cancer Paternal Grandmother        colon/pancreatiec/lymphoma   Irritable bowel syndrome Brother    AAA (abdominal aortic aneurysm) Neg Hx     New complaints: Had a bad asthma arrack this afternoon. Had to do a breathing treatment.  Social history: Lives with mom, dad and her son  Controlled substance contract: n/a     Review of Systems  Constitutional:  Positive for fatigue (no more then usual). Negative for chills, diaphoresis and fever.  Eyes:  Negative for pain.  Respiratory:  Positive for cough, shortness of breath and wheezing.   Cardiovascular:  Negative for chest pain, palpitations and leg swelling.  Gastrointestinal:  Negative for abdominal pain.  Endocrine: Negative for polydipsia.  Skin:  Negative for rash.  Neurological:  Negative for dizziness, weakness and headaches.  Hematological:  Does not bruise/bleed easily.  All other systems reviewed and are negative.     Objective:   Physical Exam Vitals and nursing note reviewed.  Constitutional:      General: She is not in  acute distress.    Appearance: Normal appearance. She is well-developed.  HENT:     Head: Normocephalic.     Right Ear: Tympanic membrane normal.     Left Ear: Tympanic membrane normal.     Nose: Nose normal.     Mouth/Throat:     Mouth: Mucous membranes are moist.  Eyes:     Pupils: Pupils are equal, round, and reactive to light.  Neck:     Vascular: No carotid bruit or JVD.  Cardiovascular:     Rate and Rhythm: Normal rate and regular rhythm.     Heart sounds: Normal heart sounds.  Pulmonary:     Effort: Pulmonary effort is normal. No respiratory distress.     Breath sounds: Normal breath sounds. No wheezing or rales.  Chest:     Chest wall: No tenderness.  Abdominal:     General: Bowel sounds are normal. There is no distension or abdominal bruit.     Palpations: Abdomen is soft. There is no hepatomegaly, splenomegaly, mass or pulsatile mass.     Tenderness: There is no abdominal tenderness.  Musculoskeletal:        General: Normal range of motion.     Cervical back: Normal range of motion and neck supple.  Lymphadenopathy:     Cervical: No cervical adenopathy.  Skin:    General: Skin is warm and dry.  Neurological:     Mental Status: She is alert and oriented to person, place, and time.     Deep Tendon Reflexes: Reflexes are normal and symmetric.  Psychiatric:        Behavior: Behavior normal.        Thought Content: Thought content normal.        Judgment: Judgment normal.    BP 124/87   Pulse 80   Temp 98.6 F (37 C) (Temporal)   Resp 20   Ht 5\' 8"  (1.727 m)   Wt 220 lb (99.8 kg)   SpO2 92%   BMI 33.45 kg/m        Assessment & Plan:  Autumn Davis comes in today with chief complaint of Medical Management of Chronic Issues   Diagnosis and orders addressed:  1. Tachycardia, paroxysmal (HCC) Avoid caffeine - metoprolol succinate (TOPROL-XL) 25 MG 24 hr tablet; Take 1 tablet (25 mg total) by mouth daily.  Dispense: 90 tablet; Refill: 1 - CBC  with Differential/Platelet - CMP14+EGFR  2. Esophageal dysphagia Chew food well before swallowing  3. Gastroesophageal reflux disease without esophagitis Avoid spicy foods Do not eat 2 hours prior to bedtime  4. Irritable bowel syndrome with constipation Continue colace as needed - amitriptyline (ELAVIL) 75 MG tablet; Take 1 tablet (75 mg total) by mouth at bedtime.  Dispense: 90 tablet; Refill: 3  5. Thyroid disease Labs pending - Thyroid Panel With TSH  6. Recurrent major depressive disorder, in full remission (HCC) Stress manaegment  7. Fibromyalgia Keep follow up with pain management  8. Mixed hyperlipidemia Low fat diet - atorvastatin (LIPITOR) 40 MG tablet; TAKE ONE (1) TABLET EACH DAY  Dispense: 90 tablet; Refill: 1 - Lipid panel  9. Inflammatory autoimmune disorder (HCC) Keep follow up with specialist  10. Systemic lupus erythematosus, unspecified SLE type, unspecified organ involvement status (HCC)  11. Seizures (HCC) - acetaZOLAMIDE (DIAMOX) 250 MG tablet; TAKE 1 TABLET EVERY MORNING, TAKE 1 TABLET AT 2PM, AND TAKE 2 TABLETSAT BEDTIME  Dispense: 360 tablet; Refill: 1  12. Vitamin D deficiency Continue vitamin d def  13. BMI 33.0-33.9,adult Discussed diet and exercise for person with BMI >25 Will recheck weight in 3-6 months  14. Peripheral edema Elevate legs when sitting - furosemide (LASIX) 20 MG tablet; Take 1 tablet (20 mg total) by mouth daily.  Dispense: 90 tablet; Refill: 1  15. Urinary frequency - tamsulosin (FLOMAX) 0.4 MG CAPS capsule; TAKE ONE (1) CAPSULE EACH DAY  Dispense: 90 capsule; Refill: 1  16. Asthma attack Stop symbicort Samples of trelogy 1 puff daily- patient will let me know is works better then [employer]. Labs pending Health Maintenance reviewed Diet and exercise encouraged  Follow up plan: 6 months   Mary-Margaret Daphine Deutscher, FNP

## 2021-05-25 DIAGNOSIS — H04123 Dry eye syndrome of bilateral lacrimal glands: Secondary | ICD-10-CM | POA: Diagnosis not present

## 2021-05-25 DIAGNOSIS — H40033 Anatomical narrow angle, bilateral: Secondary | ICD-10-CM | POA: Diagnosis not present

## 2021-05-25 LAB — CMP14+EGFR
ALT: 20 IU/L (ref 0–32)
AST: 17 IU/L (ref 0–40)
Albumin/Globulin Ratio: 2.1 (ref 1.2–2.2)
Albumin: 4.7 g/dL (ref 3.8–4.8)
Alkaline Phosphatase: 94 IU/L (ref 44–121)
BUN/Creatinine Ratio: 16 (ref 9–23)
BUN: 14 mg/dL (ref 6–20)
Bilirubin Total: 0.4 mg/dL (ref 0.0–1.2)
CO2: 20 mmol/L (ref 20–29)
Calcium: 9.4 mg/dL (ref 8.7–10.2)
Chloride: 104 mmol/L (ref 96–106)
Creatinine, Ser: 0.87 mg/dL (ref 0.57–1.00)
Globulin, Total: 2.2 g/dL (ref 1.5–4.5)
Glucose: 109 mg/dL — ABNORMAL HIGH (ref 65–99)
Potassium: 3.5 mmol/L (ref 3.5–5.2)
Sodium: 140 mmol/L (ref 134–144)
Total Protein: 6.9 g/dL (ref 6.0–8.5)
eGFR: 88 mL/min/{1.73_m2} (ref >59)

## 2021-05-25 LAB — VITAMIN D 25 HYDROXY (VIT D DEFICIENCY, FRACTURES): Vit D, 25-Hydroxy: 25.1 ng/mL — ABNORMAL LOW (ref 30.0–100.0)

## 2021-05-25 LAB — THYROID PANEL WITH TSH
Free Thyroxine Index: 1.6 (ref 1.2–4.9)
T3 Uptake Ratio: 23 % — ABNORMAL LOW (ref 24–39)
T4, Total: 7 ug/dL (ref 4.5–12.0)
TSH: 3.38 u[IU]/mL (ref 0.450–4.500)

## 2021-05-25 LAB — LIPID PANEL
Chol/HDL Ratio: 3.5 ratio (ref 0.0–4.4)
Cholesterol, Total: 249 mg/dL — ABNORMAL HIGH (ref 100–199)
HDL: 71 mg/dL (ref >39)
LDL Chol Calc (NIH): 126 mg/dL — ABNORMAL HIGH (ref 0–99)
Triglycerides: 299 mg/dL — ABNORMAL HIGH (ref 0–149)
VLDL Cholesterol Cal: 52 mg/dL — ABNORMAL HIGH (ref 5–40)

## 2021-05-25 LAB — CBC WITH DIFFERENTIAL/PLATELET
Basophils Absolute: 0.1 10*3/uL (ref 0.0–0.2)
Basos: 1 %
EOS (ABSOLUTE): 0.1 10*3/uL (ref 0.0–0.4)
Eos: 1 %
Hematocrit: 39.8 % (ref 34.0–46.6)
Hemoglobin: 13.2 g/dL (ref 11.1–15.9)
Immature Grans (Abs): 0 10*3/uL (ref 0.0–0.1)
Immature Granulocytes: 0 %
Lymphocytes Absolute: 1.9 10*3/uL (ref 0.7–3.1)
Lymphs: 14 %
MCH: 29.1 pg (ref 26.6–33.0)
MCHC: 33.2 g/dL (ref 31.5–35.7)
MCV: 88 fL (ref 79–97)
Monocytes Absolute: 0.9 10*3/uL (ref 0.1–0.9)
Monocytes: 7 %
Neutrophils Absolute: 10.8 10*3/uL — ABNORMAL HIGH (ref 1.4–7.0)
Neutrophils: 77 %
Platelets: 312 10*3/uL (ref 150–450)
RBC: 4.54 x10E6/uL (ref 3.77–5.28)
RDW: 13.2 % (ref 11.7–15.4)
WBC: 13.9 10*3/uL — ABNORMAL HIGH (ref 3.4–10.8)

## 2021-05-25 LAB — VITAMIN B12: Vitamin B-12: 892 pg/mL (ref 232–1245)

## 2021-05-28 ENCOUNTER — Other Ambulatory Visit: Payer: Self-pay | Admitting: Nurse Practitioner

## 2021-05-28 MED ORDER — VITAMIN D (ERGOCALCIFEROL) 1.25 MG (50000 UNIT) PO CAPS: 50000.0000 [IU] | ORAL_CAPSULE | ORAL | 5 refills | Status: DC

## 2021-05-31 ENCOUNTER — Encounter: Payer: Medicare Other | Admitting: Registered Nurse

## 2021-05-31 ENCOUNTER — Ambulatory Visit: Payer: Medicare Other | Admitting: Registered Nurse

## 2021-06-05 ENCOUNTER — Encounter: Payer: Medicare Other | Attending: Registered Nurse | Admitting: Registered Nurse

## 2021-06-05 ENCOUNTER — Other Ambulatory Visit: Payer: Self-pay

## 2021-06-05 ENCOUNTER — Encounter: Payer: Medicare Other | Admitting: Registered Nurse

## 2021-06-05 ENCOUNTER — Encounter: Payer: Self-pay | Admitting: Registered Nurse

## 2021-06-05 VITALS — Ht 68.0 in | Wt 221.0 lb

## 2021-06-05 DIAGNOSIS — M7062 Trochanteric bursitis, left hip: Secondary | ICD-10-CM | POA: Insufficient documentation

## 2021-06-05 DIAGNOSIS — D8989 Other specified disorders involving the immune mechanism, not elsewhere classified: Secondary | ICD-10-CM | POA: Diagnosis not present

## 2021-06-05 DIAGNOSIS — G894 Chronic pain syndrome: Secondary | ICD-10-CM | POA: Insufficient documentation

## 2021-06-05 DIAGNOSIS — G609 Hereditary and idiopathic neuropathy, unspecified: Secondary | ICD-10-CM | POA: Diagnosis not present

## 2021-06-05 DIAGNOSIS — G8929 Other chronic pain: Secondary | ICD-10-CM | POA: Diagnosis present

## 2021-06-05 DIAGNOSIS — W19XXXD Unspecified fall, subsequent encounter: Secondary | ICD-10-CM | POA: Diagnosis not present

## 2021-06-05 DIAGNOSIS — M546 Pain in thoracic spine: Secondary | ICD-10-CM | POA: Diagnosis not present

## 2021-06-05 DIAGNOSIS — Z79891 Long term (current) use of opiate analgesic: Secondary | ICD-10-CM | POA: Insufficient documentation

## 2021-06-05 DIAGNOSIS — M7061 Trochanteric bursitis, right hip: Secondary | ICD-10-CM | POA: Diagnosis not present

## 2021-06-05 DIAGNOSIS — R569 Unspecified convulsions: Secondary | ICD-10-CM | POA: Insufficient documentation

## 2021-06-05 DIAGNOSIS — Z5181 Encounter for therapeutic drug level monitoring: Secondary | ICD-10-CM | POA: Insufficient documentation

## 2021-06-05 DIAGNOSIS — M62838 Other muscle spasm: Secondary | ICD-10-CM | POA: Diagnosis not present

## 2021-06-05 DIAGNOSIS — Y92009 Unspecified place in unspecified non-institutional (private) residence as the place of occurrence of the external cause: Secondary | ICD-10-CM | POA: Diagnosis not present

## 2021-06-05 DIAGNOSIS — M797 Fibromyalgia: Secondary | ICD-10-CM | POA: Diagnosis not present

## 2021-06-05 MED ORDER — OXYCODONE-ACETAMINOPHEN 7.5-325 MG PO TABS: 1.0000 | ORAL_TABLET | Freq: Every day | ORAL | 0 refills | Status: DC | PRN

## 2021-06-05 NOTE — Progress Notes (Addendum)
Subjective:    Patient ID: Autumn Davis, female    DOB: [birth year], 35 y.o.   MRN: [id number]  HPI: Autumn Davis is a 35 y.o. female whose appointment was changed to a My-ChartVideo visit due to staffing. Autumn Davis agrees with My-Chart Video Visit and verbalizes understanding. She states her pain is located in her mid- back and bilateral hips. She rates her pain 6. Her current exercise regime is walking and performing stretching exercises.  Autumn Davis reported she had two falls last month, on on fall she slid off the bed and landed on her right side, she was able to pick herself up, she didn't seek medical attention.  She also reports she fell in the shower, she lost her footing and landed on her left side. She states she will be resuming using her shower chair and she has a scheduled appointment with neurology.   Autumn Davis is 45.00  MME.   Last UDS was Performed on 01/14/2021, it was consistent.   Autumn Davis.      Pain Inventory Average Pain 6 Pain Right Now 6 My pain is constant, sharp, and stabbing  In the last 24 hours, has pain interfered with the following? General activity 8 Relation with others 8 Enjoyment of life 8 What TIME of day is your pain at its worst? evening and night Sleep (in general) Poor  Pain is worse with: sitting, standing, and some activites Pain improves with: medication Relief from Meds: 8  Family History  Problem Relation Age of Onset   Thyroid disease Mother    Colon polyps Father    Lung cancer Maternal Grandmother    Cancer Paternal Grandmother        colon/pancreatiec/lymphoma   Irritable bowel syndrome Brother    AAA (abdominal aortic aneurysm) Neg Hx    Social History   Socioeconomic History   Marital status: Legally Separated    Spouse name: Not  on file   Number of children: 1   Years of education: Not on file   Highest education level: Not on file  Occupational History   Occupation: disabled    Employer: [employer]  Tobacco Use   Smoking status: Never   Smokeless tobacco: Never  Vaping Use   Vaping Use: Never used  Substance and Sexual Activity   Alcohol use: No    Alcohol/week: 0.0 standard drinks   Drug use: No   Sexual activity: Not on file  Other Topics Concern   Not on file  Social History Narrative   She lives alone with her 74 year old son, but they stay with her parents a lot   Social Determinants of Corporate investment banker Strain: Low Risk    Difficulty of Paying Living Expenses: Not hard at all  Food Insecurity: No Food Insecurity   Worried About Programme researcher, broadcasting/film/video in the Last Year: Never true   Barista in the Last Year: Never true  Transportation Needs: No Transportation Needs   Lack of Transportation (Medical): No   Lack of Transportation (Non-Medical): No  Physical Activity: Inactive   Days of Exercise per Week: 0 days   Minutes of Exercise per Session: 0 min  Stress: No Stress Concern Present   Feeling of Stress : Not at all  Social Connections: Unknown  Frequency of Communication with Friends and Family: More than three times a week   Frequency of Social Gatherings with Friends and Family: More than three times a week   Attends Religious Services: Not on file   Active Member of Clubs or Organizations: Not on file   Attends Banker Meetings: Not on file   Marital Status: Separated   Past Surgical History:  Procedure Laterality Date   24 HOUR PH STUDY N/A 10/29/2015   Procedure: 24 HOUR PH STUDY;  Surgeon: Iva Boop, MD;  Location: [facility];  Service: Endoscopy;  Laterality: N/A;   COLONOSCOPY     ESOPHAGEAL MANOMETRY N/A 10/29/2015   Procedure: ESOPHAGEAL MANOMETRY (EM);  Surgeon: Iva Boop, MD;  Location: [facility];  Service:  Endoscopy;  Laterality: N/A;   KNEE SURGERY  2002/2003   bil   RECTAL SURGERY  correction of prolapse   2010   Past Surgical History:  Procedure Laterality Date   82 HOUR PH STUDY N/A 10/29/2015   Procedure: 24 HOUR PH STUDY;  Surgeon: Iva Boop, MD;  Location: [facility];  Service: Endoscopy;  Laterality: N/A;   COLONOSCOPY     ESOPHAGEAL MANOMETRY N/A 10/29/2015   Procedure: ESOPHAGEAL MANOMETRY (EM);  Surgeon: Iva Boop, MD;  Location: [facility];  Service: Endoscopy;  Laterality: N/A;   KNEE SURGERY  2002/2003   bil   RECTAL SURGERY  correction of prolapse   2010   Past Medical History:  Diagnosis Date   Allergy    Arthritis    HANDS,HIPS,KNEES   Bronchitis, chronic/intermittent 01/22/2012   Depression 01/22/2012   GERD (gastroesophageal reflux disease)    Hyperlipidemia    IBS (irritable bowel syndrome) 01/22/2012   Lupus (HCC)    Neuromuscular disorder (HCC)    Osteoporosis    Seizures (HCC)    SOB (shortness of breath)    Thyroid disease    Ht 5\' 8"  (1.727 m)   Wt 221 lb (100.2 kg)   BMI 33.60 kg/m   Opioid Risk Score:   Fall Risk Score:  `1  Depression screen PHQ 2/9  Depression screen [facility] 2/9 05/24/2021 04/30/2021 02/18/2021 01/14/2021 11/20/2020 08/10/2020 05/11/2020  Decreased Interest 1 0 1 0 0 0 0  Down, Depressed, Hopeless 0 0 0 0 0 0 0  PHQ - 2 Score 1 0 1 0 0 0 0  Altered sleeping 1 - 0 - - 2 0  Tired, decreased energy 1 - 0 - - 1 0  Change in appetite 0 - 1 - - 1 0  Feeling bad or failure about yourself  0 - 0 - - 0 0  Trouble concentrating 0 - 0 - - 0 0  Moving slowly or fidgety/restless 0 - 0 - - 0 0  Suicidal thoughts 0 - 0 - - 0 0  PHQ-9 Score 3 - 2 - - 4 0  Difficult doing work/chores Not difficult at all - Not difficult at all - - - Not difficult at all  Some recent data might be hidden       Review of Systems  Constitutional: Negative.   HENT: Negative.    Eyes: Negative.   Respiratory: Negative.    Cardiovascular: Negative.    Endocrine: Negative.   Genitourinary: Negative.   Musculoskeletal:  Positive for back pain.       Spasms  Skin: Negative.   Allergic/Immunologic: Negative.   Neurological: Negative.   Hematological: Negative.   Psychiatric/Behavioral: Negative.  Objective:   Physical Exam Vitals and nursing note reviewed.  Pulmonary:     Effort: Pulmonary effort is normal.     Breath sounds: Normal breath sounds.  Musculoskeletal:     Comments: No Physical Exam Performed: Virtual Visit  Neurological:     Mental Status: She is oriented to person, place, and time.  Psychiatric:        Mood and Affect: Mood normal.        Behavior: Behavior normal.         Assessment & Plan:  1. Chronic seizure disorder: No seizure's. Continue current medication regimen with  Gabapentin. Neurology Following. 06/05/2021. 2. Chronic muscle spasms, weakness with associated pain disorder: Continue current medication regimen with Baclofen. Continue with Exercise regime. 06/05/2021. 3. Chronic dysphagia: No complaints today. Continue to monitor. GI Following. 06/05/2021. 4. Anxiety with depression : No complaints today. Stable. Continue current medication regimen with  Elavil. 06/05/2021. 5. Fibromyalgia/ Rib Pain/Chronic Pain: Continue Diclofenac: Continue with exercise and heat Therapy. 06/05/2021. Refilled:  oxyCODONE 7.5/325mg  one tablet 5 times  daily as needed  #140. Second script sent for the following month. Continue with slow weaning.   We will continue the opioid monitoring program, this consists of regular clinic visits, examinations, urine drug screen, pill counts as well as use of [place] Controlled Substance Reporting system. A 12 month History has been reviewed on the [place] Controlled Substance Reporting System 06/05/2021.  6. Peripheral Neuropathy: Continue current medication regimen with  Gabapentin: 06/05/2021. 7. Fall at home: Educated on [employer]  understanding. She has a scheduled appointment with neurology she reports.     F/U in 2 months  My-Chart Video Visit Established Patient Location of Patient: In her Home Location of Provider in the office

## 2021-06-18 ENCOUNTER — Encounter: Payer: Self-pay | Admitting: Nurse Practitioner

## 2021-06-18 ENCOUNTER — Ambulatory Visit (INDEPENDENT_AMBULATORY_CARE_PROVIDER_SITE_OTHER): Payer: Medicare Other

## 2021-06-18 ENCOUNTER — Other Ambulatory Visit: Payer: Self-pay

## 2021-06-18 ENCOUNTER — Ambulatory Visit (INDEPENDENT_AMBULATORY_CARE_PROVIDER_SITE_OTHER): Payer: Medicare Other | Admitting: Nurse Practitioner

## 2021-06-18 VITALS — BP 134/87 | HR 85 | Temp 97.7°F | Ht 68.0 in | Wt 220.2 lb

## 2021-06-18 DIAGNOSIS — M79672 Pain in left foot: Secondary | ICD-10-CM

## 2021-06-18 DIAGNOSIS — M79671 Pain in right foot: Secondary | ICD-10-CM | POA: Diagnosis not present

## 2021-06-18 DIAGNOSIS — M7731 Calcaneal spur, right foot: Secondary | ICD-10-CM | POA: Diagnosis not present

## 2021-06-18 DIAGNOSIS — M7732 Calcaneal spur, left foot: Secondary | ICD-10-CM | POA: Diagnosis not present

## 2021-06-18 IMAGING — DX DG OS CALCIS 2+V*L*
2 series · 2 of 2 positions shown · non-contrast
Comparison: None.

CLINICAL DATA: Pain of both heels.

EXAM:
LEFT OS CALCIS - 2+ VIEW

[calcaneus axial]
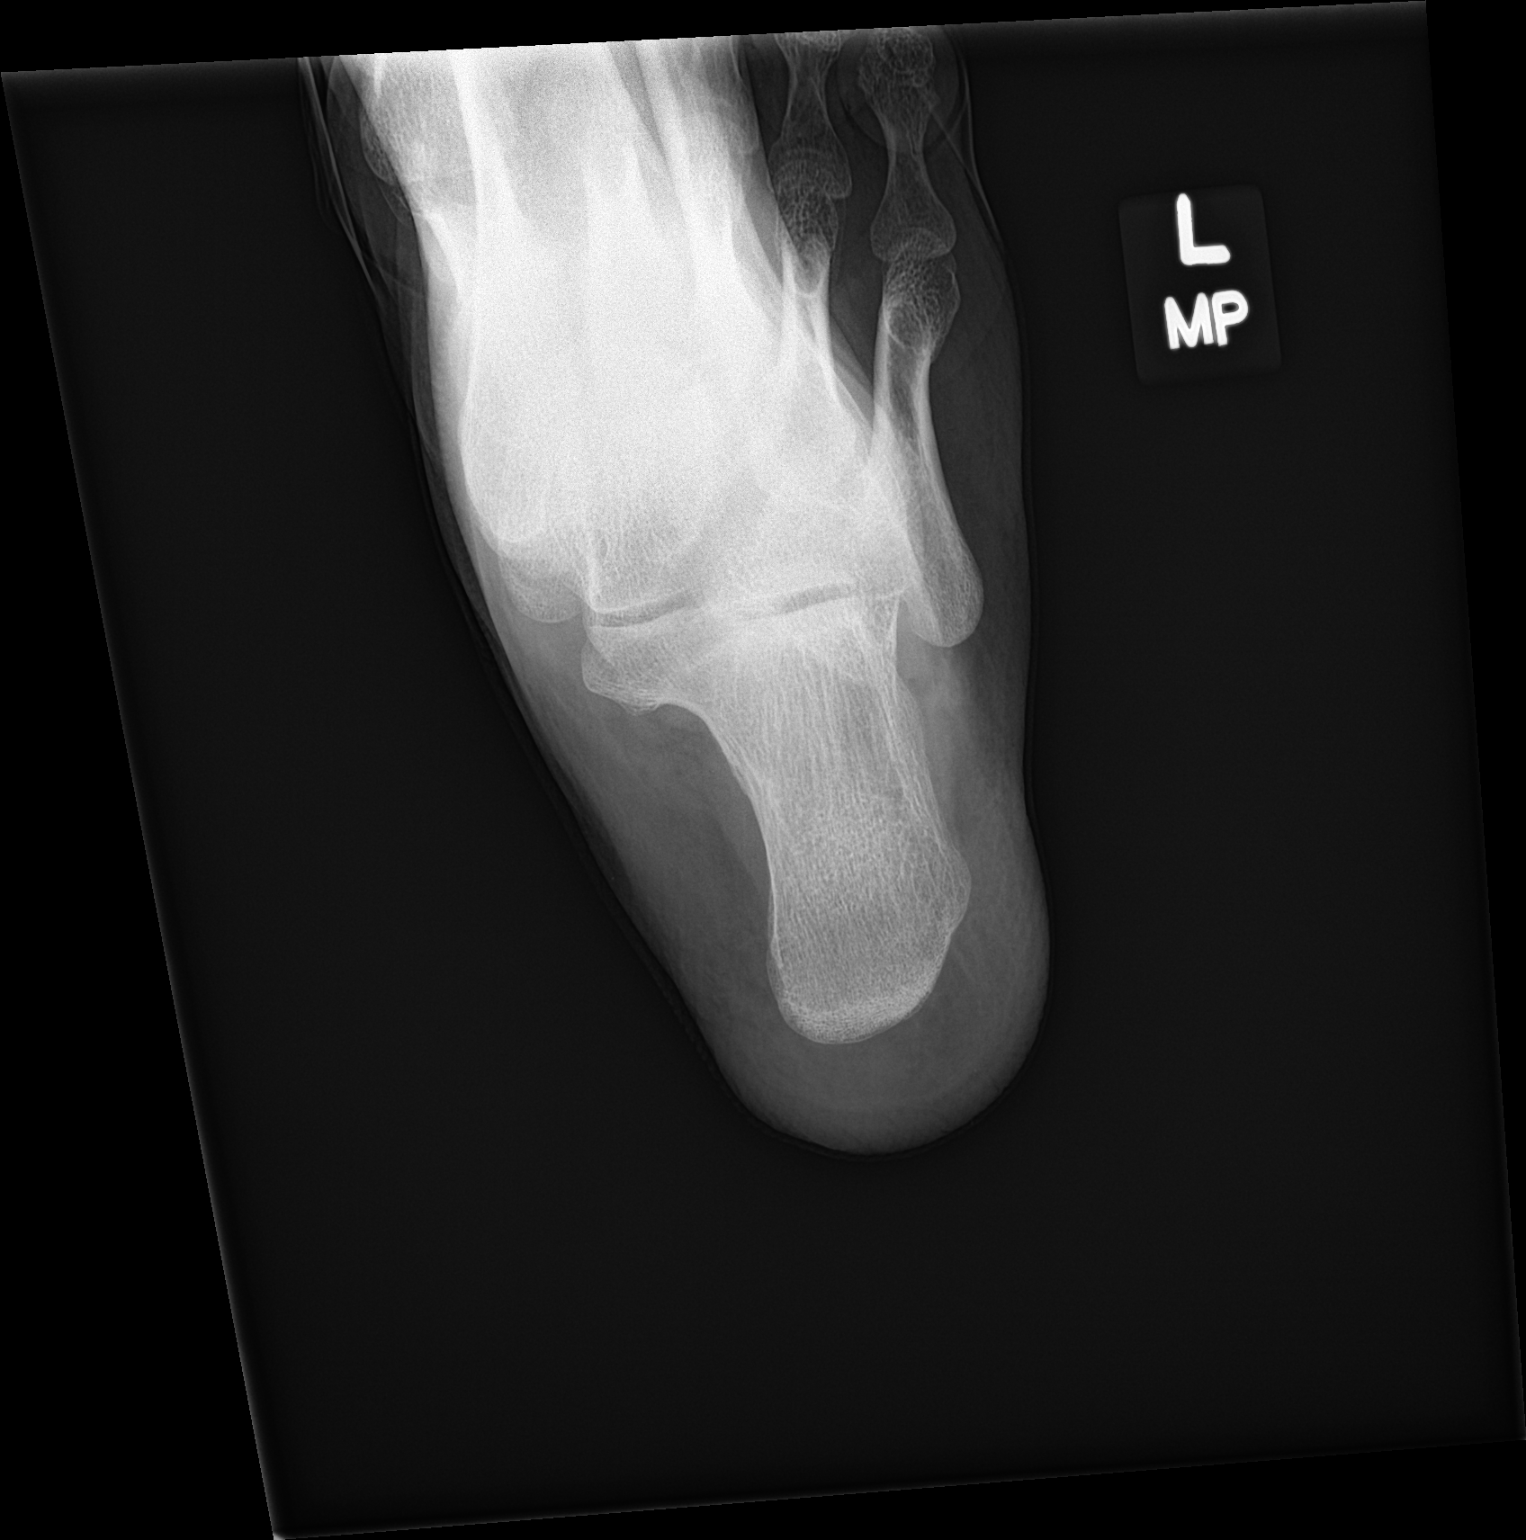

[calcaneus lat]
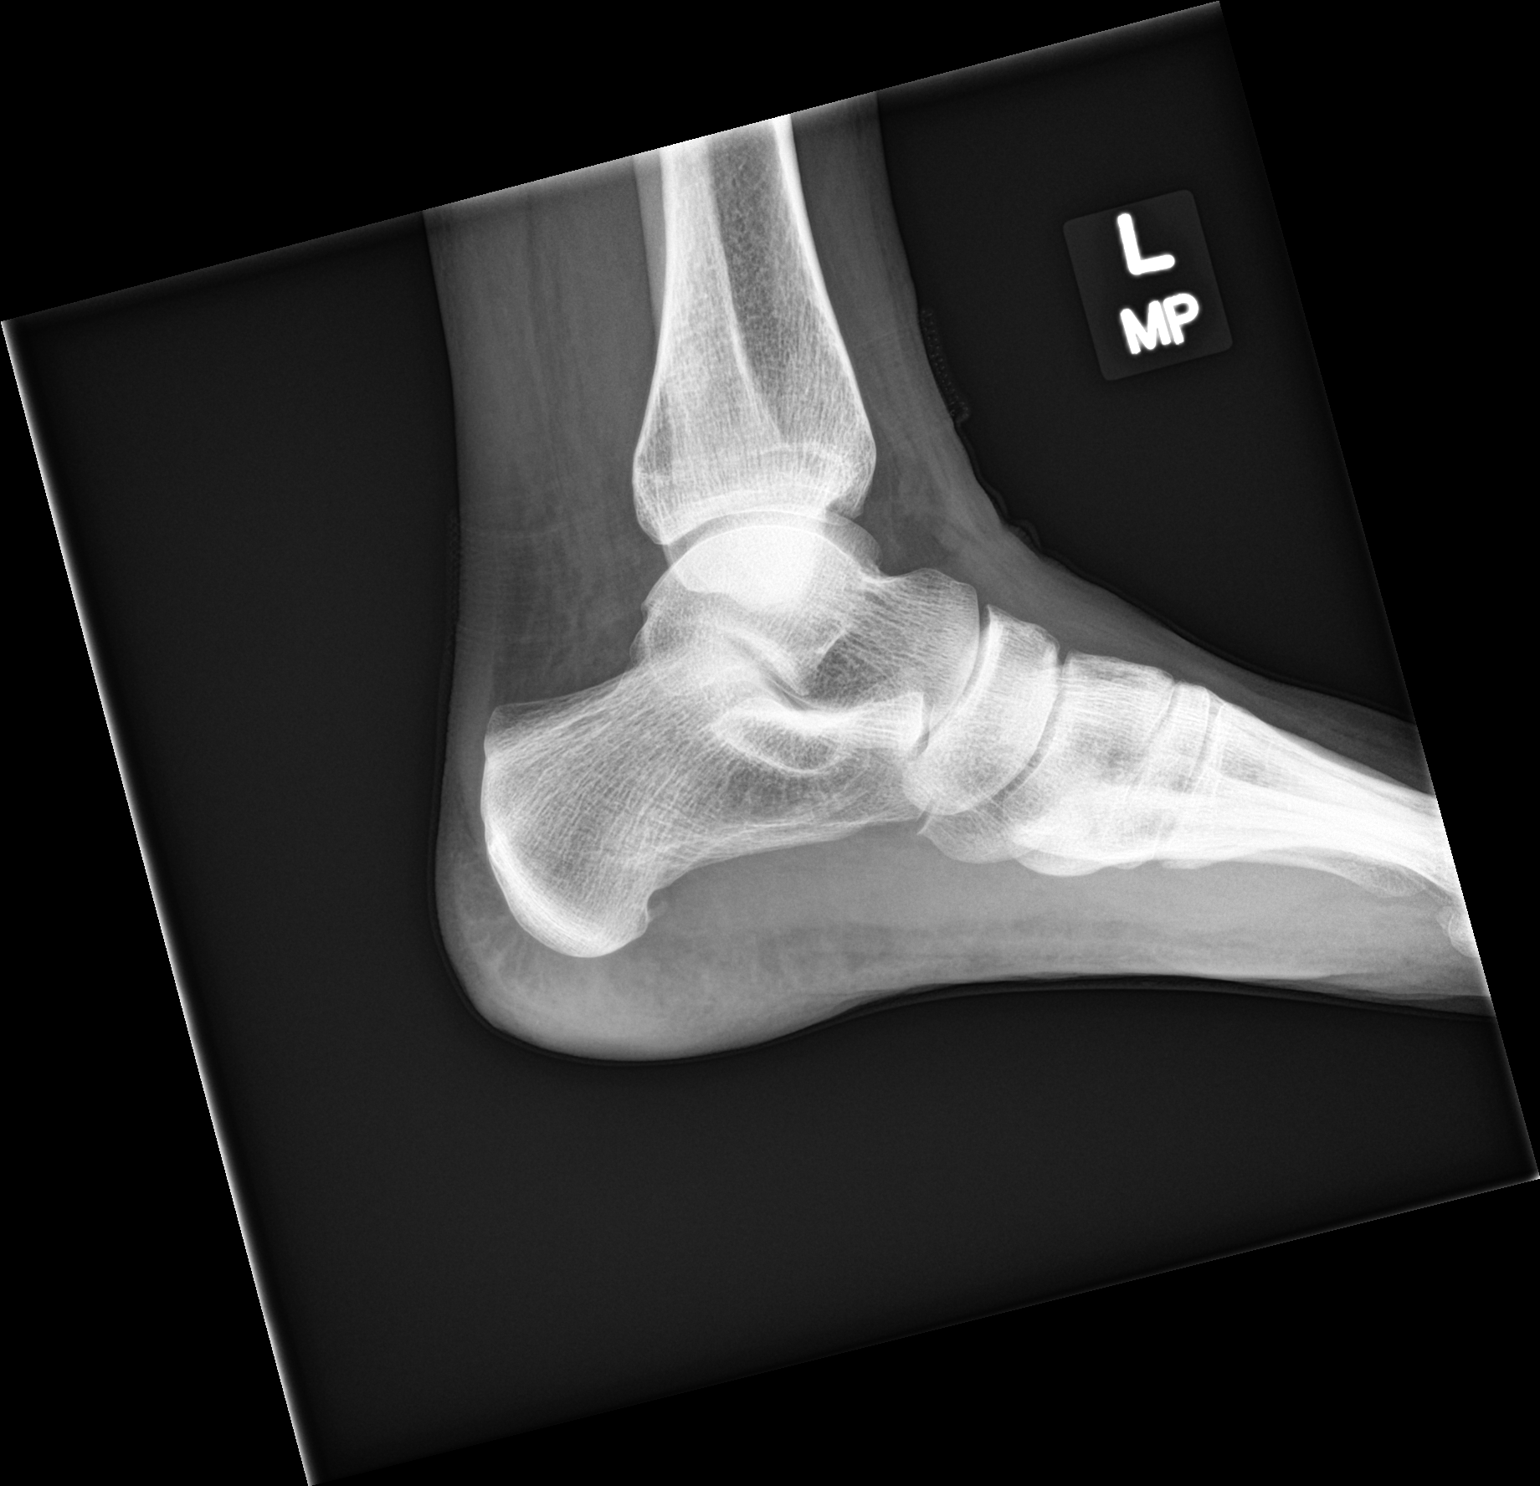

[2 of 2 positions shown; findings below may reference images not displayed]

FINDINGS: Cortical margins of the calcaneus are intact. There is no fracture.
Tiny plantar calcaneal spur. No Achilles tendon enthesophyte. Normal
subtalar alignment. No evidence of focal bone lesion or erosion.
Unremarkable soft tissues.
IMPRESSION: Tiny plantar calcaneal spur.

## 2021-06-18 MED ORDER — DICLOFENAC SODIUM 1 % EX GEL: 4.0000 g | Freq: Four times a day (QID) | CUTANEOUS | 1 refills | Status: DC

## 2021-06-18 NOTE — Patient Instructions (Signed)
Heel Spur A heel spur is a bony growth that forms on the bottom of the heel bone (calcaneus). Heel spurs are common. They often cause inflammation in the plantar fascia, which is the band of tissue that connects the toe bones to the heel bone. When the plantar fascia is inflamed, it is called plantar fasciitis. This may cause pain on the bottom of the foot, near the heel. Many people with plantar fasciitis also have heel spurs. However, spurs are not the cause of plantar fasciitis pain. What are the causes? The exact cause of heel spurs is not known. They may be caused by: Pressure on the heel bone. Bands of tissues that connect muscle to bone (tendons) pulling on the heel bone. What increases the risk? You are more likely to develop this condition if you: Are older than age 40. Are overweight. Have wear-and-tear arthritis (osteoarthritis). Have plantar fascia inflammation. Participate in sports or activities that include a lot of running or jumping. Wear poorly fitted shoes. What are the signs or symptoms? Some people have no symptoms. If you do have symptoms, they may include: Pain in the bottom of your heel. Pain that is worse when you first get out of bed. Pain that gets worse after walking or standing. How is this diagnosed? This condition may be diagnosed based on: Your symptoms and medical history. A physical exam. A foot X-ray. How is this treated? Treatment for this condition depends on how much pain you have. Treatment options may include: Doing stretching exercises and losing weight, if necessary. Wearing specific shoes or inserts inside of shoes (orthotics) for comfort and support. Wearing splints on your feet while you sleep. Splints keep your feet in a position (usually a 90-degree angle) that should prevent and relieve the pain you feel when you first get out of bed. They also make stretching easier in the morning. Taking over-the-counter medicine to relieve pain, such as  NSAIDs. Using high-intensity sound waves to break up the heel spur (extracorporeal shock wave therapy). Getting steroid injections in your heel to reduce inflammation. Having surgery, if your heel spur causes long-term (chronic) pain. Follow these instructions at home: Activity Avoid activities that cause pain until you recover, or for as long as told by your health care provider. Do stretching exercises as told. Stretch before exercising or being physically active. Managing pain, stiffness, and swelling If directed, put ice on your foot. To do this: Put ice in a plastic bag. Place a towel between your skin and the bag. Leave the ice on for 20 minutes, 2-3 times a day. Remove the ice if your skin turns bright red. This is very important. If you cannot feel pain, heat, or cold, you have a greater risk of damage to the area. Move your toes often to reduce stiffness and swelling. When possible, raise (elevate) your foot above the level of your heart while you are sitting or lying down. General instructions Take over-the-counter and prescription medicines only as told by your health care provider. Wear supportive shoes that fit well. Wear splints, inserts, or orthotics as told by your health care provider. If recommended, work with your health care provider to lose weight. This can relieve pressure on your foot. Do not use any products that contain nicotine or tobacco, such as cigarettes, e-cigarettes, and chewing tobacco. These can delay bone healing. If you need help quitting, ask your health care provider. Keep all follow-up visits. This is important. Where to find more information American Academy of   Orthopaedic Surgeons: www.orthoinfo.aaos.org Contact a health care provider if: Your pain does not go away with treatment. Your pain gets worse. Summary A heel spur is a bony growth that forms on the bottom of the heel bone (calcaneus). Heel spurs often cause inflammation in the plantar  fascia, which is the band of tissue that connects the toes to the heel bone. This may cause pain on the bottom of the foot, near the heel. Doing stretching exercises, losing weight, wearing specific shoes or shoe inserts, wearing splints while you sleep, and taking pain medicine may ease the pain and stiffness. Other treatment options may include high-intensity sound waves to break up the heel spur, steroid injections, or surgery. This information is not intended to replace advice given to you by your health care provider. Make sure you discuss any questions you have with your health care provider. Document Revised: 02/28/2020 Document Reviewed: 02/28/2020 Elsevier Patient Education  2022 Elsevier Inc.  

## 2021-06-18 NOTE — Progress Notes (Signed)
Subjective:    Patient ID: Autumn Davis, female    DOB: 10/05/1986, 35 y.o.   MRN: [id number]   Chief Complaint: Foot Pain (Bilateral heel pain //)   HPI Patient comes in today c/o foot pain. She has been having this for several months. It is mainly in her heels bil. The more she walks it gets better. Rates pain 8-9/10. She sees pain management for her other pains.    Review of Systems  Constitutional:  Negative for diaphoresis.  Eyes:  Negative for pain.  Respiratory:  Negative for shortness of breath.   Cardiovascular:  Negative for chest pain, palpitations and leg swelling.  Gastrointestinal:  Negative for abdominal pain.  Endocrine: Negative for polydipsia.  Skin:  Negative for rash.  Neurological:  Negative for dizziness, weakness and headaches.  Hematological:  Does not bruise/bleed easily.  All other systems reviewed and are negative.     Objective:   Physical Exam Vitals reviewed.  Constitutional:      Appearance: Normal appearance.  Cardiovascular:     Rate and Rhythm: Regular rhythm.     Heart sounds: Normal heart sounds.  Pulmonary:     Effort: Pulmonary effort is normal.     Breath sounds: Normal breath sounds.  Musculoskeletal:     Comments: Pain on palpation bil heel  Skin:    General: Skin is warm.  Neurological:     General: No focal deficit present.     Mental Status: She is alert and oriented to person, place, and time.  Psychiatric:        Mood and Affect: Mood normal.        Behavior: Behavior normal.    BP 134/87   Pulse 85   Temp 97.7 F (36.5 C) (Temporal)   Ht 5\' 8"  (1.727 m)   Wt 220 lb 3.2 oz (99.9 kg)   SpO2 96%   BMI 33.48 kg/m   Bil heel xray- faint heel spurs-Preliminary reading by Paulene Floor, FNP  [facility]      Assessment & Plan:  Autumn Davis in today with chief complaint of Foot Pain (Bilateral heel pain //)   1. Pain of both heels Meds ordered this encounter  Medications   diclofenac Sodium  (VOLTAREN) 1 % GEL    Sig: Apply 4 g topically 4 (four) times daily.    Dispense:  350 g    Refill:  1    Order Specific Question:   Supervising Provider    Answer:   Arville Care A [[id number]]    - DG Os Calcis Left - DG Os Calcis Right    The above assessment and management plan was discussed with the patient. The patient verbalized understanding of and has agreed to the management plan. Patient is aware to call the clinic if symptoms persist or worsen. Patient is aware when to return to the clinic for a follow-up visit. Patient educated on when it is appropriate to go to the emergency department.   Mary-Margaret Daphine Deutscher, FNP

## 2021-07-01 ENCOUNTER — Other Ambulatory Visit: Payer: Self-pay

## 2021-07-01 ENCOUNTER — Ambulatory Visit: Payer: Medicare Other | Admitting: Podiatry

## 2021-07-01 DIAGNOSIS — M722 Plantar fascial fibromatosis: Secondary | ICD-10-CM

## 2021-07-01 MED ORDER — BETAMETHASONE SOD PHOS & ACET 6 (3-3) MG/ML IJ SUSP
3.0000 mg | Freq: Once | INTRAMUSCULAR | Status: AC
Start: 2021-07-01 — End: 2021-07-01
  Administered 2021-07-01: 3 mg via INTRA_ARTICULAR

## 2021-07-01 MED ORDER — METHYLPREDNISOLONE 4 MG PO TBPK: ORAL_TABLET | ORAL | 0 refills | Status: DC

## 2021-07-01 NOTE — Progress Notes (Signed)
   Subjective: 35 y.o. female presenting today as a new patient for evaluation of bilateral heel pain has been going on for several weeks now.  Patient states that she was diagnosed with heel spurs.  Gradual onset.  She went to her PCP on 06/19/2021 where x-rays were taken.  She presents for further treatment and evaluation   Past Medical History:  Diagnosis Date   Allergy    Arthritis    HANDS,HIPS,KNEES   Bronchitis, chronic/intermittent 01/22/2012   Depression 01/22/2012   GERD (gastroesophageal reflux disease)    Hyperlipidemia    IBS (irritable bowel syndrome) 01/22/2012   Lupus (HCC)    Neuromuscular disorder (HCC)    Osteoporosis    Seizures (HCC)    SOB (shortness of breath)    Thyroid disease      Objective: Physical Exam General: The patient is alert and oriented x3 in no acute distress.  Dermatology: Skin is warm, dry and supple bilateral lower extremities. Negative for open lesions or macerations bilateral.   Vascular: Dorsalis Pedis and Posterior Tibial pulses palpable bilateral.  Capillary fill time is immediate to all digits.  Neurological: Epicritic and protective threshold intact bilateral.   Musculoskeletal: Tenderness to palpation to the plantar aspect of the bilateral heels along the plantar fascia. All other joints range of motion within normal limits bilateral. Strength 5/5 in all groups bilateral.   Radiographic exam taken at PCP 06/19/2021 bilateral calci: No significant evidence of prominent heel spurs.  The posterior tubercle of the calcaneus appears well contoured without any irregularities  Assessment: 1. plantar fasciitis bilateral feet  Plan of Care:  1. Patient evaluated. Xrays reviewed.   2. Injection of 0.5cc Celestone soluspan injected into the bilateral heels.  3. Rx for Medrol Dose Pak placed 4.  After completion of the Dosepak resume diclofenac 75 mg 2 times daily  5. Plantar fascial band(s) dispensed for bilateral plantar fasciitis. 6.   Continue OTC power step insoles that she purchased on [employer]  7.  Instructed patient regarding therapies and modalities at home to alleviate symptoms.  8. Return to clinic in 4 weeks.    Felecia Shelling, DPM [facility]  Dr. Felecia Shelling, DPM    [address] [place], [place] [address]                Office [phone]  Fax [phone]

## 2021-07-11 ENCOUNTER — Other Ambulatory Visit: Payer: Self-pay | Admitting: Nurse Practitioner

## 2021-07-11 MED ORDER — TRELEGY ELLIPTA 100-62.5-25 MCG/INH IN AEPB: 1.0000 | INHALATION_SPRAY | Freq: Every day | RESPIRATORY_TRACT | 2 refills | Status: DC

## 2021-07-29 ENCOUNTER — Other Ambulatory Visit: Payer: Self-pay

## 2021-07-29 ENCOUNTER — Encounter: Payer: Self-pay | Admitting: Registered Nurse

## 2021-07-29 ENCOUNTER — Encounter: Payer: Medicare Other | Attending: Registered Nurse | Admitting: Registered Nurse

## 2021-07-29 ENCOUNTER — Ambulatory Visit: Payer: Medicare Other | Admitting: Podiatry

## 2021-07-29 VITALS — BP 123/85 | HR 74 | Temp 98.5°F | Ht 68.0 in | Wt 214.2 lb

## 2021-07-29 DIAGNOSIS — M7061 Trochanteric bursitis, right hip: Secondary | ICD-10-CM | POA: Diagnosis not present

## 2021-07-29 DIAGNOSIS — Z5181 Encounter for therapeutic drug level monitoring: Secondary | ICD-10-CM | POA: Diagnosis not present

## 2021-07-29 DIAGNOSIS — M7062 Trochanteric bursitis, left hip: Secondary | ICD-10-CM

## 2021-07-29 DIAGNOSIS — G894 Chronic pain syndrome: Secondary | ICD-10-CM

## 2021-07-29 DIAGNOSIS — R569 Unspecified convulsions: Secondary | ICD-10-CM | POA: Diagnosis not present

## 2021-07-29 DIAGNOSIS — Z79891 Long term (current) use of opiate analgesic: Secondary | ICD-10-CM

## 2021-07-29 DIAGNOSIS — M797 Fibromyalgia: Secondary | ICD-10-CM | POA: Diagnosis not present

## 2021-07-29 DIAGNOSIS — M722 Plantar fascial fibromatosis: Secondary | ICD-10-CM

## 2021-07-29 DIAGNOSIS — M546 Pain in thoracic spine: Secondary | ICD-10-CM | POA: Diagnosis not present

## 2021-07-29 DIAGNOSIS — D8989 Other specified disorders involving the immune mechanism, not elsewhere classified: Secondary | ICD-10-CM

## 2021-07-29 DIAGNOSIS — G609 Hereditary and idiopathic neuropathy, unspecified: Secondary | ICD-10-CM

## 2021-07-29 DIAGNOSIS — G8929 Other chronic pain: Secondary | ICD-10-CM | POA: Diagnosis not present

## 2021-07-29 DIAGNOSIS — M62838 Other muscle spasm: Secondary | ICD-10-CM | POA: Diagnosis not present

## 2021-07-29 MED ORDER — OXYCODONE-ACETAMINOPHEN 7.5-325 MG PO TABS: 1.0000 | ORAL_TABLET | Freq: Every day | ORAL | 0 refills | Status: DC | PRN

## 2021-07-29 NOTE — Progress Notes (Signed)
Subjective:    Patient ID: Autumn Davis, female    DOB: [birth year], 35 y.o.   MRN: [id number]  HPI: Autumn Davis is a 35 y.o. female who returns for follow up appointment for chronic pain and medication refill. She states her  pain is located in  her mid back ( Reports rib pain). She rates her pain 4. Her current exercise regime is walking and performing stretching exercises.  Autumn Davis reports two weeks ago she was walking into her son room, she lost her footing and landed on her left side.  She believes she was trying to get up too fast. She was able to pick herself up, she didn't seek medical attention. Educated on falls prevention. She verbalizes understanding.   Autumn Davis Morphine equivalent is 56.25 MME.   UDS ordered today.     Pain Inventory Average Pain 7 Pain Right Now 4 My pain is constant, sharp, burning, dull, stabbing, and tingling  In the last 24 hours, has pain interfered with the following? General activity 8 Relation with others 6 Enjoyment of life 8 What TIME of day is your pain at its worst? evening and night Sleep (in general) Poor  Pain is worse with: walking, bending, sitting, inactivity, standing, and some activites Pain improves with: rest, heat/ice, therapy/exercise, pacing activities, medication, and TENS Relief from Meds: 6  Family History  Problem Relation Age of Onset   Thyroid disease Mother    Colon polyps Father    Lung cancer Maternal Grandmother    Cancer Paternal Grandmother        colon/pancreatiec/lymphoma   Irritable bowel syndrome Brother    AAA (abdominal aortic aneurysm) Neg Hx    Social History   Socioeconomic History   Marital status: Legally Separated    Spouse name: Not on file   Number of children: 1   Years of education: Not on file   Highest education level: Not on file  Occupational History   Occupation: disabled    Employer: [employer]  Tobacco Use   Smoking status: Never    Smokeless tobacco: Never  Vaping Use   Vaping Use: Never used  Substance and Sexual Activity   Alcohol use: No    Alcohol/week: 0.0 standard drinks   Drug use: No   Sexual activity: Not on file  Other Topics Concern   Not on file  Social History Narrative   She lives alone with her 51 year old son, but they stay with her parents a lot   Social Determinants of Corporate investment banker Strain: Low Risk    Difficulty of Paying Living Expenses: Not hard at all  Food Insecurity: No Food Insecurity   Worried About Programme researcher, broadcasting/film/video in the Last Year: Never true   Barista in the Last Year: Never true  Transportation Needs: No Transportation Needs   Lack of Transportation (Medical): No   Lack of Transportation (Non-Medical): No  Physical Activity: Inactive   Days of Exercise per Week: 0 days   Minutes of Exercise per Session: 0 min  Stress: No Stress Concern Present   Feeling of Stress : Not at all  Social Connections: Unknown   Frequency of Communication with Friends and Family: More than three times a week   Frequency of Social Gatherings with Friends and Family: More than three times a week   Attends Religious Services: Not on Scientist, clinical (histocompatibility and immunogenetics) or Organizations:  Not on file   Attends Club or Organization Meetings: Not on file   Marital Status: Separated   Past Surgical History:  Procedure Laterality Date   32 HOUR PH STUDY N/A 10/29/2015   Procedure: 24 HOUR PH STUDY;  Surgeon: Iva Boop, MD;  Location: [facility];  Service: Endoscopy;  Laterality: N/A;   COLONOSCOPY     ESOPHAGEAL MANOMETRY N/A 10/29/2015   Procedure: ESOPHAGEAL MANOMETRY (EM);  Surgeon: Iva Boop, MD;  Location: [facility];  Service: Endoscopy;  Laterality: N/A;   KNEE SURGERY  2002/2003   bil   RECTAL SURGERY  correction of prolapse   2010   Past Surgical History:  Procedure Laterality Date   67 HOUR PH STUDY N/A 10/29/2015   Procedure: 24 HOUR PH STUDY;   Surgeon: Iva Boop, MD;  Location: [facility];  Service: Endoscopy;  Laterality: N/A;   COLONOSCOPY     ESOPHAGEAL MANOMETRY N/A 10/29/2015   Procedure: ESOPHAGEAL MANOMETRY (EM);  Surgeon: Iva Boop, MD;  Location: [facility];  Service: Endoscopy;  Laterality: N/A;   KNEE SURGERY  2002/2003   bil   RECTAL SURGERY  correction of prolapse   2010   Past Medical History:  Diagnosis Date   Allergy    Arthritis    HANDS,HIPS,KNEES   Bronchitis, chronic/intermittent 01/22/2012   Depression 01/22/2012   GERD (gastroesophageal reflux disease)    Hyperlipidemia    IBS (irritable bowel syndrome) 01/22/2012   Lupus (HCC)    Neuromuscular disorder (HCC)    Osteoporosis    Seizures (HCC)    SOB (shortness of breath)    Thyroid disease    BP 123/85   Pulse 74   Temp 98.5 F (36.9 C) (Oral)   Ht 5\' 8"  (1.727 m)   Wt 214 lb 3.2 oz (97.2 kg)   SpO2 97%   BMI 32.57 kg/m   Opioid Risk Score:   Fall Risk Score:  `1  Depression screen PHQ 2/9  Depression screen [facility] 2/9 06/18/2021 06/05/2021 05/24/2021 04/30/2021 02/18/2021 01/14/2021 11/20/2020  Decreased Interest 0 0 1 0 1 0 0  Down, Depressed, Hopeless 0 0 0 0 0 0 0  PHQ - 2 Score 0 0 1 0 1 0 0  Altered sleeping 1 - 1 - 0 - -  Tired, decreased energy 1 - 1 - 0 - -  Change in appetite 0 - 0 - 1 - -  Feeling bad or failure about yourself  0 - 0 - 0 - -  Trouble concentrating 0 - 0 - 0 - -  Moving slowly or fidgety/restless 0 - 0 - 0 - -  Suicidal thoughts 0 - 0 - 0 - -  PHQ-9 Score 2 - 3 - 2 - -  Difficult doing work/chores Not difficult at all - Not difficult at all - Not difficult at all - -  Some recent data might be hidden     Review of Systems  Gastrointestinal:  Positive for abdominal pain.  Musculoskeletal:  Positive for back pain.       Feet pain   All other systems reviewed and are negative.     Objective:   Physical Exam Vitals and nursing note reviewed.  Constitutional:      Appearance: Normal appearance.   Cardiovascular:     Rate and Rhythm: Normal rate and regular rhythm.     Pulses: Normal pulses.     Heart sounds: Normal heart sounds.  Pulmonary:  Effort: Pulmonary effort is normal.     Breath sounds: Normal breath sounds.  Musculoskeletal:     Cervical back: Normal range of motion and neck supple.     Comments: Normal Muscle Bulk and Muscle Testing Reveals:  Upper Extremities: Full ROM and Muscle Strength 5/5 Thoracic Paraspinal Tenderness: T-7-T-9 Bilateral Greater Trochanter Tenderness Lower Extremities : Full ROM and Muscle Strength 5/5 Arises from Table with ease Narrow Based Gait     Skin:    General: Skin is warm and dry.  Neurological:     Mental Status: She is alert and oriented to person, place, and time.  Psychiatric:        Mood and Affect: Mood normal.        Behavior: Behavior normal.         Assessment & Plan:  1. Chronic seizure disorder: No seizure's. Continue current medication regimen with  Gabapentin. Neurology Following. 07/29/2021. 2. Chronic muscle spasms, weakness with associated pain disorder: Continue current medication regimen with Baclofen. Continue with Exercise regime. 07/29/2021. 3. Chronic dysphagia: No complaints today. Continue to monitor. GI Following. 07/29/2021. 4. Anxiety with depression : No complaints today. Stable. Continue current medication regimen with  Elavil. 07/29/2021. 5. Fibromyalgia/ Rib Pain/Chronic Pain: Continue Diclofenac: Continue with exercise and heat Therapy. 07/29/2021. Refilled:  oxyCODONE 7.5/325mg  one tablet 5 times  daily as needed  #140. Second script sent for the following month. Continue with slow weaning.   We will continue the opioid monitoring program, this consists of regular clinic visits, examinations, urine drug screen, pill counts as well as use of [place] Controlled Substance Reporting system. A 12 month History has been reviewed on the [place] Controlled Substance Reporting System  07/29/2021.  6. Peripheral Neuropathy: Continue current medication regimen with  Gabapentin: 07/29/2021. 7. Fall at home: Educated on [employer] understanding. We will continue to monitor. 07/29/2021    F/U in 2 months

## 2021-07-30 MED ORDER — BETAMETHASONE SOD PHOS & ACET 6 (3-3) MG/ML IJ SUSP: 3.0000 mg | Freq: Once | INTRAMUSCULAR | Status: DC

## 2021-07-30 NOTE — Progress Notes (Signed)
   Subjective: 35 y.o. female presenting today for f/u evaluation of plantar fasciitis bilateral. Patient states that she if feeling somewhat better.  Patient has been wearing OTC power step insoles which helped.  She also says the plantar fascial bands helped.  She presents for further treatment evaluation patient states that she was diagnosed with heel spurs in the past.    She originally went to her PCP on 06/19/2021 where x-rays were taken.    Past Medical History:  Diagnosis Date   Allergy    Arthritis    HANDS,HIPS,KNEES   Bronchitis, chronic/intermittent 01/22/2012   Depression 01/22/2012   GERD (gastroesophageal reflux disease)    Hyperlipidemia    IBS (irritable bowel syndrome) 01/22/2012   Lupus (HCC)    Neuromuscular disorder (HCC)    Osteoporosis    Seizures (HCC)    SOB (shortness of breath)    Thyroid disease      Objective: Physical Exam General: The patient is alert and oriented x3 in no acute distress.  Dermatology: Skin is warm, dry and supple bilateral lower extremities. Negative for open lesions or macerations bilateral.   Vascular: Dorsalis Pedis and Posterior Tibial pulses palpable bilateral.  Capillary fill time is immediate to all digits.  Neurological: Epicritic and protective threshold intact bilateral.   Musculoskeletal: Tenderness to palpation to the plantar aspect of the bilateral heels along the plantar fascia. All other joints range of motion within normal limits bilateral. Strength 5/5 in all groups bilateral.   Radiographic exam taken at PCP 06/19/2021 bilateral calci: No significant evidence of prominent heel spurs.  The posterior tubercle of the calcaneus appears well contoured without any irregularities  Assessment: 1. plantar fasciitis bilateral feet  Plan of Care:  1. Patient evaluated. Xrays reviewed.   2. Injection of 0.5cc Celestone soluspan injected into the bilateral heels.  3. Continue Diclofenac 75mg  BID PRN 4. Continue plantar  fascial braces PRN 5. 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   3yr, DPM [facility]  Dr. Felecia Shelling, DPM    [address] [place], [place]                Office [phone]  Fax [phone]

## 2021-07-31 ENCOUNTER — Ambulatory Visit: Payer: Medicare Other | Admitting: Podiatry

## 2021-08-02 LAB — TOXASSURE SELECT,+ANTIDEPR,UR

## 2021-08-06 ENCOUNTER — Other Ambulatory Visit: Payer: Self-pay | Admitting: Nurse Practitioner

## 2021-08-06 ENCOUNTER — Other Ambulatory Visit: Payer: Self-pay

## 2021-08-06 ENCOUNTER — Telehealth: Payer: Self-pay | Admitting: *Deleted

## 2021-08-06 DIAGNOSIS — R569 Unspecified convulsions: Secondary | ICD-10-CM

## 2021-08-06 NOTE — Telephone Encounter (Signed)
Urine drug screen for this encounter is consistent for prescribed medication 

## 2021-09-02 ENCOUNTER — Ambulatory Visit: Payer: Medicare Other | Admitting: Podiatry

## 2021-09-02 ENCOUNTER — Other Ambulatory Visit: Payer: Self-pay

## 2021-09-02 DIAGNOSIS — M722 Plantar fascial fibromatosis: Secondary | ICD-10-CM | POA: Diagnosis not present

## 2021-09-02 NOTE — Progress Notes (Signed)
   Subjective: 35 y.o. female presenting today for f/u evaluation of plantar fasciitis bilateral.  Patient states that the left foot is feeling tolerable.  The right foot has actually increased in pain.  She says the last injection she received did not help.  She continues to wear OTC arch supports and plantar fascial braces.  She also takes low-dose prednisone.  She says the prednisone taper she took a few months ago helped significantly.   Past Medical History:  Diagnosis Date   Allergy    Arthritis    HANDS,HIPS,KNEES   Bronchitis, chronic/intermittent 01/22/2012   Depression 01/22/2012   GERD (gastroesophageal reflux disease)    Hyperlipidemia    IBS (irritable bowel syndrome) 01/22/2012   Lupus (HCC)    Neuromuscular disorder (HCC)    Osteoporosis    Seizures (HCC)    SOB (shortness of breath)    Thyroid disease      Objective: Physical Exam General: The patient is alert and oriented x3 in no acute distress.  Dermatology: Skin is warm, dry and supple bilateral lower extremities. Negative for open lesions or macerations bilateral.   Vascular: Dorsalis Pedis and Posterior Tibial pulses palpable bilateral.  Capillary fill time is immediate to all digits.  Neurological: Epicritic and protective threshold intact bilateral.   Musculoskeletal: Tenderness to palpation to the plantar aspect of the bilateral heels along the plantar fascia. All other joints range of motion within normal limits bilateral. Strength 5/5 in all groups bilateral.   Radiographic exam taken at PCP 06/19/2021 bilateral calci: No significant evidence of prominent heel spurs.  The posterior tubercle of the calcaneus appears well contoured without any irregularities  Assessment: 1. plantar fasciitis bilateral feet  Plan of Care:  1. Patient evaluated.  2.  Today were going to immobilize the patient in a walking cam boot.  Weightbearing as tolerated. 3.  Patient has night splints at home.  Wear nightly 4.   Continue OTC arch supports and plantar fascial brace left foot 5.  Patient on chronic low-dose prednisone 6.  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   Felecia Shelling, DPM [facility]  Dr. Felecia Shelling, DPM    [address] [place], [place] [address]                Office [phone]  Fax [phone]

## 2021-09-10 ENCOUNTER — Other Ambulatory Visit: Payer: Self-pay

## 2021-09-10 ENCOUNTER — Ambulatory Visit: Payer: Medicare Other | Admitting: Allergy and Immunology

## 2021-09-10 VITALS — BP 122/80 | HR 85 | Temp 98.1°F | Resp 18 | Ht 67.5 in | Wt 212.0 lb

## 2021-09-10 DIAGNOSIS — K219 Gastro-esophageal reflux disease without esophagitis: Secondary | ICD-10-CM | POA: Diagnosis not present

## 2021-09-10 DIAGNOSIS — J4540 Moderate persistent asthma, uncomplicated: Secondary | ICD-10-CM | POA: Diagnosis not present

## 2021-09-10 MED ORDER — AEROCHAMBER PLUS MISC: 1.0000 | Freq: Once | 0 refills | Status: AC

## 2021-09-10 MED ORDER — MOMETASONE FURO-FORMOTEROL FUM 200-5 MCG/ACT IN AERO: 2.0000 | INHALATION_SPRAY | Freq: Two times a day (BID) | RESPIRATORY_TRACT | 5 refills | Status: DC

## 2021-09-10 MED ORDER — OMEPRAZOLE 40 MG PO CPDR: 40.0000 mg | DELAYED_RELEASE_CAPSULE | Freq: Two times a day (BID) | ORAL | 5 refills | Status: DC

## 2021-09-10 MED ORDER — FAMOTIDINE 40 MG PO TABS: 40.0000 mg | ORAL_TABLET | Freq: Every day | ORAL | 5 refills | Status: DC

## 2021-09-10 NOTE — Progress Notes (Signed)
[place] - [place] - [place] - [place]   Dear Autumn Davis,  Thank you for referring Autumn Davis to the [facility] [place] on 09/10/2021.   Below is a summation of this patient's evaluation and recommendations.  Thank you for your referral. I will keep you informed about this patient's response to treatment.   If you have any questions please do not hesitate to contact me.   Sincerely,  Autumn Priest, MD Allergy / Immunology [facility]   ______________________________________________________________________    NEW PATIENT NOTE  Referring Provider: [name], * Primary Provider: [name], FNP Date of office visit: 09/10/2021    Subjective:   Chief Complaint:  Autumn Davis (DOB: Oct 23, 1986) is a 35 y.o. female who presents to the clinic on 09/10/2021 with a chief complaint of Asthma (Says she has had issues in the summer and it was bad. Strong Symbicort ) .     HPI: Autumn Davis presents to this clinic in evaluation of asthma attacks.  Apparently since the age of 39 she has been having problems with asthma on and off but the intensity of her asthma appears to be increasing lately.  Usually her activity is very high from summer through fall season.  She describes these "attacks" where she has sudden onset of throat shutting off, inability to speak, coughing, gagging, vomiting, for which she will use a short acting bronchodilator and in about 30 minutes her throat appears to ease up and then she has an irritated airway for a few more days for which she uses a bronchodilator throughout that time.  She has been under the care of a pulmonologist for both this issue with asthma and also a post COVID cough after contracted COVID in 2021 which fortunately appears to have improved significantly.  She has been given multiple inhalers including  Symbicort and Trelegy and has also been given montelukast and she does not really find anything that helps her to any significant degree.  It should be noted that she has constant throat clearing and a coating in her throat and drainage in her throat.  She does have reflux disease with indigestion that is still active even though she uses omeprazole twice a day.  She drinks 1 Mountain Dew per day and has no chocolate or alcohol.  She does not have any significant upper airway symptoms.  She states that she has autoimmune disease including lupus and RA and is on prednisone at 5 mg a day.  She has been on other immunosuppressive agents in the past but at this point in time prednisone is her only immunosuppressive drug.  Past Medical History:  Diagnosis Date   Allergy    Arthritis    HANDS,HIPS,KNEES   Bronchitis, chronic/intermittent 01/22/2012   Depression 01/22/2012   GERD (gastroesophageal reflux disease)    Hyperlipidemia    IBS (irritable bowel syndrome) 01/22/2012   Lupus (HCC)    Neuromuscular disorder (HCC)    Osteoporosis    Seizures (HCC)    SOB (shortness of breath)    Thyroid disease     Past Surgical History:  Procedure Laterality Date   92 HOUR PH STUDY N/A 10/29/2015   Procedure: 24 HOUR PH STUDY;  Surgeon: Iva Boop, MD;  Location: [facility];  Service: Endoscopy;  Laterality: N/A;   COLONOSCOPY     ESOPHAGEAL MANOMETRY N/A 10/29/2015   Procedure: ESOPHAGEAL MANOMETRY (  EM);  Surgeon: Iva Boop, MD;  Location: [facility];  Service: Endoscopy;  Laterality: N/A;   KNEE SURGERY  2002/2003   bil   RECTAL SURGERY  correction of prolapse   2010    Allergies as of 09/10/2021       Reactions   Ciprofloxacin Other (See Comments)   Muscle weakness and numbness   Compazine Other (See Comments)   hallucinations   Prochlorperazine Other (See Comments), Rash   Pt states it makes her feel like things are crawling on her        Medication List     acetaZOLAMIDE 250 MG tablet Commonly known as: DIAMOX TAKE 1 TABLET EVERY MORNING, TAKE 1 TABLET AT 2PM, AND TAKE 2 TABLETSAT BEDTIME   amitriptyline 75 MG tablet Commonly known as: ELAVIL Take 1 tablet (75 mg total) by mouth at bedtime.   atorvastatin 40 MG tablet Commonly known as: LIPITOR TAKE ONE (1) TABLET EACH DAY   B12 FAST DISSOLVE PO Take by mouth 2 (two) times daily.   cyanocobalamin 1000 MCG/ML injection Commonly known as: (VITAMIN B-12) INJECT IM EVERY 30 DAYS   baclofen 20 MG tablet Commonly known as: LIORESAL TAKE ONE TABLET FOUR TIMES DAILY   CALCIUM 1200+D3 PO Take 1 tablet by mouth daily.   cetirizine 10 MG tablet Commonly known as: ZYRTEC Take 10 mg by mouth daily.   cycloSPORINE 0.05 % ophthalmic emulsion Commonly known as: RESTASIS Place 1 drop into both eyes 2 (two) times daily.   diclofenac 75 MG EC tablet Commonly known as: VOLTAREN TAKE ONE TABLET BY MOUTH TWICE DAILY   diclofenac Sodium 1 % Gel Commonly known as: Voltaren Apply 4 g topically 4 (four) times daily.   docusate sodium 100 MG capsule Commonly known as: COLACE Take 100 mg by mouth 2 (two) times daily. PRN   EPINEPHrine 0.3 mg/0.3 mL Soaj injection Commonly known as: EPI-PEN USE AS DIRECTED   furosemide 20 MG tablet Commonly known as: LASIX Take 1 tablet (20 mg total) by mouth daily.   gabapentin 600 MG tablet Commonly known as: NEURONTIN TAKE 1 TABLET 5 TIMES DAILY   ipratropium-albuterol 0.5-2.5 (3) MG/3ML Soln Commonly known as: DUONEB Take 3 mLs by nebulization every 4 (four) hours as needed.   L-Methylfolate 15 MG Tabs Take 1 tablet (15 mg total) by mouth daily.   MAGNESIUM GLYCINATE PO Take 2 tablets by mouth daily.   metoprolol succinate 25 MG 24 hr tablet Commonly known as: TOPROL-XL Take 1 tablet (25 mg total) by mouth daily.   montelukast 10 MG tablet Commonly known as: SINGULAIR Take 1 tablet (10 mg total) by mouth at bedtime.    nystatin 100000 UNIT/ML suspension Commonly known as: MYCOSTATIN Use as directed 5 mLs (500,000 Units total) in the mouth or throat 4 (four) times daily.   nystatin-triamcinolone ointment Commonly known as: MYCOLOG Apply 1 application topically 2 (two) times daily.   Omega 3 1000 MG Caps Take 1 capsule by mouth daily.   oxyCODONE-acetaminophen 7.5-325 MG tablet Commonly known as: Percocet Take 1 tablet by mouth 5 (five) times daily as needed for moderate pain. No More Than 5 a day.   predniSONE 5 MG tablet Commonly known as: DELTASONE Take 1 tablet (5 mg total) by mouth every morning.   promethazine 12.5 MG tablet Commonly known as: PHENERGAN Take 1 tablet (12.5 mg total) by mouth every 8 (eight) hours as needed for nausea or vomiting.   tamsulosin 0.4 MG Caps capsule Commonly  known as: FLOMAX TAKE ONE (1) CAPSULE EACH DAY   Trelegy Ellipta 100-62.5-25 MCG/ACT Aepb Generic drug: Fluticasone-Umeclidin-Vilant Inhale 1 puff into the lungs daily.   Ventolin HFA 108 (90 Base) MCG/ACT inhaler Generic drug: albuterol USE 2 PUFFS EVERY 4 TO 6 HOURS AS NEEDED   VITAMIN C ADULT GUMMIES PO Take 2 each by mouth daily.   Vitamin D (Ergocalciferol) 1.25 MG (50000 UNIT) Caps capsule Commonly known as: DRISDOL Take 1 capsule (50,000 Units total) by mouth every 7 (seven) days.   Vitamin D3 1.25 MG (50000 UT) Caps Take 50,000 Units by mouth once a week.   vitamin E 180 MG (400 UNITS) capsule Take 1,000 Units by mouth daily.   ZINC PO Take 1 tablet by mouth daily.    Review of systems negative except as noted in HPI / PMHx or noted below:  Review of Systems  Constitutional: Negative.   HENT: Negative.    Eyes: Negative.   Respiratory: Negative.    Cardiovascular: Negative.   Gastrointestinal: Negative.   Genitourinary: Negative.   Musculoskeletal: Negative.   Skin: Negative.   Neurological: Negative.   Endo/Heme/Allergies: Negative.   Psychiatric/Behavioral: Negative.      Family History  Problem Relation Age of Onset   Thyroid disease Mother    Colon polyps Father    Lung cancer Maternal Grandmother    Cancer Paternal Grandmother        colon/pancreatiec/lymphoma   Irritable bowel syndrome Brother    AAA (abdominal aortic aneurysm) Neg Hx     Social History   Socioeconomic History   Marital status: Legally Separated    Spouse name: Not on file   Number of children: 1   Years of education: Not on file   Highest education level: Not on file  Occupational History   Occupation: disabled    Employer: [employer]  Tobacco Use   Smoking status: Never   Smokeless tobacco: Never  Vaping Use   Vaping Use: Never used  Substance and Sexual Activity   Alcohol use: No    Alcohol/week: 0.0 standard drinks   Drug use: No   Sexual activity: Not on file  Other Topics Concern   Not on file  Social History Narrative   She lives alone with her 46 year old son, but they stay with her parents a lot   Social Determinants of Corporate investment banker Strain: Low Risk    Difficulty of Paying Living Expenses: Not hard at all  Food Insecurity: No Food Insecurity   Worried About Programme researcher, broadcasting/film/video in the Last Year: Never true   Barista in the Last Year: Never true  Transportation Needs: No Transportation Needs   Lack of Transportation (Medical): No   Lack of Transportation (Non-Medical): No  Physical Activity: Inactive   Days of Exercise per Week: 0 days   Minutes of Exercise per Session: 0 min  Stress: No Stress Concern Present   Feeling of Stress : Not at all  Social Connections: Unknown   Frequency of Communication with Friends and Family: More than three times a week   Frequency of Social Gatherings with Friends and Family: More than three times a week   Attends Religious Services: Not on Scientist, clinical (histocompatibility and immunogenetics) or Organizations: Not on file   Attends Banker Meetings: Not on file   Marital Status:  Separated  Intimate Partner Violence: Not At Risk   Fear of Current or  Ex-Partner: No   Emotionally Abused: No   Physically Abused: No   Sexually Abused: No    Environmental and Social history  Lives in a house with a dry environment, no animals located inside the household, no carpet in the bedroom, no plastic on the bed, plastic on the pillow, and no smoking ongoing with inside the household.  Objective:   Vitals:   09/10/21 1007  BP: 122/80  Pulse: 85  Resp: 18  Temp: 98.1 F (36.7 C)  SpO2: 98%   Height: 5' 7.5" (171.5 cm) Weight: 212 lb (96.2 kg)  Physical Exam Constitutional:      Appearance: She is not diaphoretic.     Comments: Throat clearing  HENT:     Head: Normocephalic.     Right Ear: Tympanic membrane, ear canal and external ear normal.     Left Ear: Tympanic membrane, ear canal and external ear normal.     Nose: Nose normal. No mucosal edema or rhinorrhea.     Mouth/Throat:     Pharynx: Uvula midline. No oropharyngeal exudate.  Eyes:     Conjunctiva/sclera: Conjunctivae normal.  Neck:     Thyroid: No thyromegaly.     Trachea: Trachea normal. No tracheal tenderness or tracheal deviation.  Cardiovascular:     Rate and Rhythm: Normal rate and regular rhythm.     Heart sounds: Normal heart sounds, S1 normal and S2 normal. No murmur heard. Pulmonary:     Effort: No respiratory distress.     Breath sounds: Normal breath sounds. No stridor. No wheezing or rales.  Lymphadenopathy:     Head:     Right side of head: No tonsillar adenopathy.     Left side of head: No tonsillar adenopathy.     Cervical: No cervical adenopathy.  Skin:    Findings: No erythema or rash.     Nails: There is no clubbing.  Neurological:     Mental Status: She is alert.    Diagnostics: Allergy skin tests were performed.  She demonstrated hypersensitivity to house dust mite.  Spirometry was performed and demonstrated an FEV1 of 2.10 @ 63 % of predicted. FEV1/FVC =  0.71  Results of blood tests obtained 24 May 2021 identifies WBC 13.9, absolute eosinophil 100, absolute lymphocyte 1900, hemoglobin 13.2, platelet 312.  Results of blood tests obtained for April 2022 identifies negative ANA, CCP 6U/L, RA less than 10 U/mL.  Results of pulmonary function tests obtained 25 January 2021 identifies TLC 78% predicted, RV 80% predicted, DL/VA 409% predicted.  Results of high-resolution chest CT scan obtained 25 December 2020 identified the following:  Cardiovascular: No significant vascular findings. Normal heart size. No pericardial effusion.   Mediastinum/Nodes: No enlarged mediastinal, hilar, or axillary lymph nodes. Thyroid gland, trachea, and esophagus demonstrate no significant findings.   Lungs/Pleura: Minimal scarring of the lung bases and lingula (series 7, image 94). No significant air trapping on expiratory phase imaging. No pleural effusion or pneumothorax.   Assessment and Plan:    1. Not well controlled moderate persistent asthma   2. LPRD (laryngopharyngeal reflux disease)      1.  Allergen avoidance measures - dust mite  2.  Treat and prevent inflammation:  A.  Dulera 200 - 2 inhalations 2 times per day w/ spacer (empty lungs) B.  Montelukast 10 mg -1 tablet 1 time per day  3.  Treat and prevent reflux/LPR:  A.  Consolidate all forms of caffeine consumption B.  Omeprazole 40 mg -1 tablet  twice a day C.  Famotidine 40 mg -1 tablet in evening D.  Replace throat clearing with swallowing maneuver  4.  If needed:  A. Albuterol HFA - 2 inhalations or nebulization every 4-6 hours  5.  Obtain fall flu vaccine  6.  Return to clinic in 4 weeks or earlier if problem  Evaleigh appears to have some degree of atopic disease giving rise to inflammation of her airway and she will perform allergen avoidance measures directed against dust mite and use a combination of Dulera and montelukast.  She also has very significant laryngeal dysfunction  most likely from her LPR and possibly a component of VCD and were going to treat her with the therapy noted above which includes very aggressive therapy directed against reflux and some behavioral modification regarding her ongoing throat clearing.  I will see her back in this clinic in 4 weeks to assess her response to this approach or earlier if there is a problem.  Autumn Priest, MD Allergy / Immunology [facility] [place]

## 2021-09-10 NOTE — Patient Instructions (Addendum)
  1.  Allergen avoidance measures  - dust mite  2.  Treat and prevent inflammation:  A.  Dulera 200 - 2 inhalations 2 times per day w/ spacer (empty lungs) B.  Montelukast 10 mg -1 tablet 1 time per day  3.  Treat and prevent reflux/LPR:  A.  Consolidate all forms of caffeine consumption B.  Omeprazole 40 mg -1 tablet twice a day C.  Famotidine 40 mg -1 tablet in evening D.  Replace throat clearing with swallowing maneuver  4.  If needed:  A. Albuterol HFA - 2 inhalations or nebulization every 4-6 hours  5.  Obtain fall flu vaccine  6.  Return to clinic in 4 weeks or earlier if problem

## 2021-09-11 ENCOUNTER — Encounter: Payer: Self-pay | Admitting: Allergy and Immunology

## 2021-09-19 ENCOUNTER — Other Ambulatory Visit: Payer: Self-pay | Admitting: Nurse Practitioner

## 2021-09-19 MED ORDER — NYSTATIN 100000 UNIT/GM EX CREA: 1.0000 "application " | TOPICAL_CREAM | Freq: Two times a day (BID) | CUTANEOUS | 2 refills | Status: AC

## 2021-09-19 NOTE — Progress Notes (Signed)
Yeast under bil breasts Meds ordered this encounter  Medications   nystatin cream (MYCOSTATIN)    Sig: Apply 1 application topically 2 (two) times daily.    Dispense:  30 g    Refill:  2    Order Specific Question:   Supervising Provider    Answer:   Arville Care A [id number]

## 2021-09-30 ENCOUNTER — Encounter: Payer: Medicare Other | Attending: Registered Nurse | Admitting: Registered Nurse

## 2021-09-30 ENCOUNTER — Encounter: Payer: Self-pay | Admitting: Registered Nurse

## 2021-09-30 ENCOUNTER — Other Ambulatory Visit: Payer: Self-pay

## 2021-09-30 ENCOUNTER — Ambulatory Visit: Payer: Medicare Other | Admitting: Podiatry

## 2021-09-30 VITALS — BP 111/75 | HR 78 | Temp 98.2°F | Ht 67.5 in | Wt 211.0 lb

## 2021-09-30 DIAGNOSIS — G894 Chronic pain syndrome: Secondary | ICD-10-CM | POA: Diagnosis not present

## 2021-09-30 DIAGNOSIS — M546 Pain in thoracic spine: Secondary | ICD-10-CM | POA: Insufficient documentation

## 2021-09-30 DIAGNOSIS — Z79891 Long term (current) use of opiate analgesic: Secondary | ICD-10-CM

## 2021-09-30 DIAGNOSIS — Z5181 Encounter for therapeutic drug level monitoring: Secondary | ICD-10-CM | POA: Diagnosis not present

## 2021-09-30 DIAGNOSIS — M722 Plantar fascial fibromatosis: Secondary | ICD-10-CM | POA: Diagnosis not present

## 2021-09-30 DIAGNOSIS — M7062 Trochanteric bursitis, left hip: Secondary | ICD-10-CM

## 2021-09-30 DIAGNOSIS — M797 Fibromyalgia: Secondary | ICD-10-CM | POA: Diagnosis not present

## 2021-09-30 DIAGNOSIS — R569 Unspecified convulsions: Secondary | ICD-10-CM

## 2021-09-30 DIAGNOSIS — G609 Hereditary and idiopathic neuropathy, unspecified: Secondary | ICD-10-CM | POA: Diagnosis present

## 2021-09-30 DIAGNOSIS — M62838 Other muscle spasm: Secondary | ICD-10-CM | POA: Diagnosis not present

## 2021-09-30 DIAGNOSIS — G8929 Other chronic pain: Secondary | ICD-10-CM

## 2021-09-30 MED ORDER — OXYCODONE-ACETAMINOPHEN 7.5-325 MG PO TABS: 1.0000 | ORAL_TABLET | Freq: Every day | ORAL | 0 refills | Status: DC | PRN

## 2021-09-30 NOTE — Progress Notes (Signed)
   Subjective: 35 y.o. female presenting today for f/u evaluation of plantar fasciitis bilateral.  Patient states that overall there has been significant improvement.  She has been wearing the cam boot to the right lower extremity over the past 4 weeks.  She presents for further treatment and evaluation   Past Medical History:  Diagnosis Date   Allergy    Arthritis    HANDS,HIPS,KNEES   Bronchitis, chronic/intermittent 01/22/2012   Depression 01/22/2012   GERD (gastroesophageal reflux disease)    Hyperlipidemia    IBS (irritable bowel syndrome) 01/22/2012   Lupus (HCC)    Neuromuscular disorder (HCC)    Osteoporosis    Seizures (HCC)    SOB (shortness of breath)    Thyroid disease      Objective: Physical Exam General: The patient is alert and oriented x3 in no acute distress.  Dermatology: Skin is warm, dry and supple bilateral lower extremities. Negative for open lesions or macerations bilateral.   Vascular: Dorsalis Pedis and Posterior Tibial pulses palpable bilateral.  Capillary fill time is immediate to all digits.  Neurological: Epicritic and protective threshold intact bilateral.   Musculoskeletal: Significantly improved patient states that overall there is been significant improvement.  tenderness to palpation to the plantar aspect of the bilateral heels along the plantar fascia. All other joints range of motion within normal limits bilateral. Strength 5/5 in all groups bilateral.   Radiographic exam taken at PCP 06/19/2021 bilateral calci: No significant evidence of prominent heel spurs.  The posterior tubercle of the calcaneus appears well contoured without any irregularities  Assessment: 1. plantar fasciitis bilateral feet  Plan of Care:  1. Patient evaluated.  2.  Patient may now begin to transition out of the cam boot into good supportive shoes and sneakers 3.  Patient has night splints at home.  Wear nightly 4.  Continue OTC arch supports and plantar fascial brace  left foot 5.  Patient on chronic low-dose prednisone 6.  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   Felecia Shelling, DPM [facility]  Dr. Felecia Shelling, DPM    [address] [place], [place] [address]                Office [phone]  Fax [phone]

## 2021-09-30 NOTE — Progress Notes (Signed)
Subjective:    Patient ID: Autumn Davis, female    DOB: [birth year], 35 y.o.   MRN: [id number]  HPI: Autumn Davis is a 35 y.o. female who returns for follow up appointment for chronic pain and medication refill. She states her  pain is located in her ribs ( Thoracic Pain)  R>L. She rates her pain 5. Her current exercise regime is walking and performing stretching exercises.  [name] states her grandfather is hospitalized and she is scheduled to repeat her mammogram. Emotional support given. She states she wants to re-scheduled due to holidays, she was encouraged to cal her gynecologist regarding the above, she verbalizes understanding.   [name] equivalent is 46.25 MME.   Last UDS was Performed on 07/29/2021, it was consistent.      Pain Inventory Average Pain 7 Pain Right Now 5 My pain is constant, sharp, burning, tingling, and aching  In the last 24 hours, has pain interfered with the following? General activity 7 Relation with others 5 Enjoyment of life 5 What TIME of day is your pain at its worst? evening and night Sleep (in general) Poor  Pain is worse with: walking, bending, sitting, inactivity, standing, and some activites Pain improves with: rest, heat/ice, therapy/exercise, pacing activities, medication, and TENS Relief from Meds: 8  Family History  Problem Relation Age of Onset   Thyroid disease Mother    Colon polyps Father    Lung cancer Maternal Grandmother    Cancer Paternal Grandmother        colon/pancreatiec/lymphoma   Irritable bowel syndrome Brother    AAA (abdominal aortic aneurysm) Neg Hx    Social History   Socioeconomic History   Marital status: Legally Separated    Spouse name: Not on file   Number of children: 1   Years of education: Not on file   Highest education level: Not on file  Occupational History   Occupation: disabled    Employer: [employer]  Tobacco Use   Smoking status:  Never   Smokeless tobacco: Never  Vaping Use   Vaping Use: Never used  Substance and Sexual Activity   Alcohol use: No    Alcohol/week: 0.0 standard drinks   Drug use: No   Sexual activity: Not on file  Other Topics Concern   Not on file  Social History Narrative   She lives alone with her 28 year old son, but they stay with her parents a lot   Social Determinants of Corporate investment banker Strain: Low Risk    Difficulty of Paying Living Expenses: Not hard at all  Food Insecurity: No Food Insecurity   Worried About Programme researcher, broadcasting/film/video in the Last Year: Never true   Barista in the Last Year: Never true  Transportation Needs: No Transportation Needs   Lack of Transportation (Medical): No   Lack of Transportation (Non-Medical): No  Physical Activity: Inactive   Days of Exercise per Week: 0 days   Minutes of Exercise per Session: 0 min  Stress: No Stress Concern Present   Feeling of Stress : Not at all  Social Connections: Unknown   Frequency of Communication with Friends and Family: More than three times a week   Frequency of Social Gatherings with Friends and Family: More than three times a week   Attends Religious Services: Not on file   Active Member of Clubs or Organizations: Not on file   Attends Club or  Organization Meetings: Not on file   Marital Status: Separated   Past Surgical History:  Procedure Laterality Date   48 HOUR PH STUDY N/A 10/29/2015   Procedure: 24 HOUR PH STUDY;  Surgeon: Iva Boop, MD;  Location: [facility];  Service: Endoscopy;  Laterality: N/A;   COLONOSCOPY     ESOPHAGEAL MANOMETRY N/A 10/29/2015   Procedure: ESOPHAGEAL MANOMETRY (EM);  Surgeon: Iva Boop, MD;  Location: [facility];  Service: Endoscopy;  Laterality: N/A;   KNEE SURGERY  2002/2003   bil   RECTAL SURGERY  correction of prolapse   2010   Past Surgical History:  Procedure Laterality Date   46 HOUR PH STUDY N/A 10/29/2015   Procedure: 24 HOUR PH STUDY;   Surgeon: Iva Boop, MD;  Location: [facility];  Service: Endoscopy;  Laterality: N/A;   COLONOSCOPY     ESOPHAGEAL MANOMETRY N/A 10/29/2015   Procedure: ESOPHAGEAL MANOMETRY (EM);  Surgeon: Iva Boop, MD;  Location: [facility];  Service: Endoscopy;  Laterality: N/A;   KNEE SURGERY  2002/2003   bil   RECTAL SURGERY  correction of prolapse   2010   Past Medical History:  Diagnosis Date   Allergy    Arthritis    HANDS,HIPS,KNEES   Bronchitis, chronic/intermittent 01/22/2012   Depression 01/22/2012   GERD (gastroesophageal reflux disease)    Hyperlipidemia    IBS (irritable bowel syndrome) 01/22/2012   Lupus (HCC)    Neuromuscular disorder (HCC)    Osteoporosis    Seizures (HCC)    SOB (shortness of breath)    Thyroid disease    BP 111/75   Pulse 78   Temp 98.2 F (36.8 C)   Ht 5' 7.5" (1.715 m)   Wt 211 lb (95.7 kg)   SpO2 96%   BMI 32.56 kg/m   Opioid Risk Score:   Fall Risk Score:  `1  Depression screen PHQ 2/9  Depression screen [facility] 2/9 06/18/2021 06/05/2021 05/24/2021 04/30/2021 02/18/2021 01/14/2021 11/20/2020  Decreased Interest 0 0 1 0 1 0 0  Down, Depressed, Hopeless 0 0 0 0 0 0 0  PHQ - 2 Score 0 0 1 0 1 0 0  Altered sleeping 1 - 1 - 0 - -  Tired, decreased energy 1 - 1 - 0 - -  Change in appetite 0 - 0 - 1 - -  Feeling bad or failure about yourself  0 - 0 - 0 - -  Trouble concentrating 0 - 0 - 0 - -  Moving slowly or fidgety/restless 0 - 0 - 0 - -  Suicidal thoughts 0 - 0 - 0 - -  PHQ-9 Score 2 - 3 - 2 - -  Difficult doing work/chores Not difficult at all - Not difficult at all - Not difficult at all - -  Some recent data might be hidden    Review of Systems  Gastrointestinal:  Positive for abdominal pain.       RIB AREA PAIN FRONT & BACK  Musculoskeletal:  Positive for back pain and gait problem.       HANDS & FEET  All other systems reviewed and are negative.     Objective:   Physical Exam Vitals and nursing note reviewed.  Constitutional:       Appearance: Normal appearance.  Cardiovascular:     Rate and Rhythm: Normal rate and regular rhythm.     Pulses: Normal pulses.     Heart sounds: Normal heart sounds.  Pulmonary:  Effort: Pulmonary effort is normal.     Breath sounds: Normal breath sounds.  Musculoskeletal:     Cervical back: Normal range of motion and neck supple.     Comments: Normal Muscle Bulk and Muscle Testing Reveals:  Upper Extremities: Full ROM and Muscle Strength 5/5  Thoracic Hypersensitivity: T-7-T-9 Mainly Right Side  Lower Extremities: Full ROM and Muscle Strength 5/5 Wearing Right [place] Arises from Table with ease Narrow Based  Gait     Skin:    General: Skin is warm and dry.  Neurological:     Mental Status: She is alert and oriented to person, place, and time.  Psychiatric:        Mood and Affect: Mood normal.        Behavior: Behavior normal.         Assessment & Plan:  1. Chronic seizure disorder: No seizure's. Continue current medication regimen with  Gabapentin. Neurology Following. 09/30/2021. 2. Chronic muscle spasms, weakness with associated pain disorder: Continue current medication regimen with Baclofen. Continue with Exercise regime. 09/30/2021. 3. Chronic dysphagia: No complaints today. Continue to monitor. GI Following. 09/30/2021. 4. Anxiety with depression : No complaints today. Stable. Continue current medication regimen with  Elavil. 09/30/2021. 5. Fibromyalgia/ Rib Pain/Chronic Pain: Continue Diclofenac: Continue with exercise and heat Therapy. 09/30/2021. Refilled:  oxyCODONE 7.5/325mg  one tablet 5 times  daily as needed  #140. Second script sent for the following month. Continue with slow weaning.   We will continue the opioid monitoring program, this consists of regular clinic visits, examinations, urine drug screen, pill counts as well as use of [place] Controlled Substance Reporting system. A 12 month History has been reviewed on the [place] Controlled  Substance Reporting System 09/30/2021.  6. Peripheral Neuropathy: Continue current medication regimen with  Gabapentin: 09/30/2021.  F/U in 2 months

## 2021-10-09 ENCOUNTER — Ambulatory Visit: Payer: Medicare Other | Admitting: Internal Medicine

## 2021-10-09 ENCOUNTER — Other Ambulatory Visit: Payer: Self-pay

## 2021-10-09 ENCOUNTER — Ambulatory Visit (INDEPENDENT_AMBULATORY_CARE_PROVIDER_SITE_OTHER): Payer: Medicare Other

## 2021-10-09 DIAGNOSIS — Z23 Encounter for immunization: Secondary | ICD-10-CM

## 2021-11-06 ENCOUNTER — Other Ambulatory Visit: Payer: Self-pay | Admitting: Nurse Practitioner

## 2021-11-06 ENCOUNTER — Other Ambulatory Visit: Payer: Medicare Other

## 2021-11-12 ENCOUNTER — Ambulatory Visit: Admission: RE | Admit: 2021-11-12 | Payer: Medicare Other | Source: Ambulatory Visit

## 2021-11-12 ENCOUNTER — Ambulatory Visit
Admission: RE | Admit: 2021-11-12 | Discharge: 2021-11-12 | Disposition: A | Discharge status: Home / Self Care | Payer: Medicare Other | Source: Ambulatory Visit | Attending: Obstetrics and Gynecology | Admitting: Obstetrics and Gynecology

## 2021-11-12 ENCOUNTER — Other Ambulatory Visit: Payer: Self-pay

## 2021-11-12 DIAGNOSIS — R928 Other abnormal and inconclusive findings on diagnostic imaging of breast: Secondary | ICD-10-CM

## 2021-11-12 DIAGNOSIS — R922 Inconclusive mammogram: Secondary | ICD-10-CM | POA: Diagnosis not present

## 2021-11-25 ENCOUNTER — Ambulatory Visit (INDEPENDENT_AMBULATORY_CARE_PROVIDER_SITE_OTHER): Payer: Medicare Other | Admitting: Nurse Practitioner

## 2021-11-25 ENCOUNTER — Encounter: Payer: Self-pay | Admitting: Nurse Practitioner

## 2021-11-25 VITALS — BP 121/78 | HR 71 | Temp 98.2°F | Resp 20 | Ht 67.0 in | Wt 203.0 lb

## 2021-11-25 DIAGNOSIS — D8989 Other specified disorders involving the immune mechanism, not elsewhere classified: Secondary | ICD-10-CM | POA: Diagnosis not present

## 2021-11-25 DIAGNOSIS — K219 Gastro-esophageal reflux disease without esophagitis: Secondary | ICD-10-CM | POA: Diagnosis not present

## 2021-11-25 DIAGNOSIS — R569 Unspecified convulsions: Secondary | ICD-10-CM

## 2021-11-25 DIAGNOSIS — F3342 Major depressive disorder, recurrent, in full remission: Secondary | ICD-10-CM

## 2021-11-25 DIAGNOSIS — R350 Frequency of micturition: Secondary | ICD-10-CM | POA: Diagnosis not present

## 2021-11-25 DIAGNOSIS — M329 Systemic lupus erythematosus, unspecified: Secondary | ICD-10-CM | POA: Diagnosis not present

## 2021-11-25 DIAGNOSIS — R609 Edema, unspecified: Secondary | ICD-10-CM

## 2021-11-25 DIAGNOSIS — E559 Vitamin D deficiency, unspecified: Secondary | ICD-10-CM

## 2021-11-25 DIAGNOSIS — E079 Disorder of thyroid, unspecified: Secondary | ICD-10-CM | POA: Diagnosis not present

## 2021-11-25 DIAGNOSIS — E782 Mixed hyperlipidemia: Secondary | ICD-10-CM

## 2021-11-25 DIAGNOSIS — I479 Paroxysmal tachycardia, unspecified: Secondary | ICD-10-CM

## 2021-11-25 DIAGNOSIS — Z6833 Body mass index (BMI) 33.0-33.9, adult: Secondary | ICD-10-CM

## 2021-11-25 DIAGNOSIS — R6889 Other general symptoms and signs: Secondary | ICD-10-CM | POA: Diagnosis not present

## 2021-11-25 DIAGNOSIS — K581 Irritable bowel syndrome with constipation: Secondary | ICD-10-CM | POA: Diagnosis not present

## 2021-11-25 DIAGNOSIS — M797 Fibromyalgia: Secondary | ICD-10-CM | POA: Diagnosis not present

## 2021-11-25 DIAGNOSIS — R1319 Other dysphagia: Secondary | ICD-10-CM | POA: Diagnosis not present

## 2021-11-25 DIAGNOSIS — Z136 Encounter for screening for cardiovascular disorders: Secondary | ICD-10-CM | POA: Diagnosis not present

## 2021-11-25 DIAGNOSIS — R600 Localized edema: Secondary | ICD-10-CM

## 2021-11-25 MED ORDER — PREDNISONE 5 MG PO TABS: 5.0000 mg | ORAL_TABLET | Freq: Every morning | ORAL | 1 refills | Status: DC

## 2021-11-25 MED ORDER — TAMSULOSIN HCL 0.4 MG PO CAPS: ORAL_CAPSULE | ORAL | 1 refills | Status: DC

## 2021-11-25 MED ORDER — FUROSEMIDE 20 MG PO TABS: 20.0000 mg | ORAL_TABLET | Freq: Every day | ORAL | 1 refills | Status: DC

## 2021-11-25 MED ORDER — OMEPRAZOLE 40 MG PO CPDR: 40.0000 mg | DELAYED_RELEASE_CAPSULE | Freq: Two times a day (BID) | ORAL | 5 refills | Status: DC

## 2021-11-25 MED ORDER — FAMOTIDINE 40 MG PO TABS: 40.0000 mg | ORAL_TABLET | Freq: Every day | ORAL | 5 refills | Status: DC

## 2021-11-25 MED ORDER — GABAPENTIN 600 MG PO TABS: ORAL_TABLET | ORAL | 1 refills | Status: DC

## 2021-11-25 MED ORDER — METOPROLOL SUCCINATE ER 25 MG PO TB24: 25.0000 mg | ORAL_TABLET | Freq: Every day | ORAL | 1 refills | Status: DC

## 2021-11-25 MED ORDER — ATORVASTATIN CALCIUM 40 MG PO TABS: ORAL_TABLET | ORAL | 1 refills | Status: DC

## 2021-11-25 MED ORDER — BACLOFEN 20 MG PO TABS: ORAL_TABLET | ORAL | 1 refills | Status: DC

## 2021-11-25 MED ORDER — AMITRIPTYLINE HCL 75 MG PO TABS: 75.0000 mg | ORAL_TABLET | Freq: Every day | ORAL | 1 refills | Status: DC

## 2021-11-25 MED ORDER — ACETAZOLAMIDE 250 MG PO TABS: ORAL_TABLET | ORAL | 1 refills | Status: DC

## 2021-11-25 NOTE — Patient Instructions (Signed)
Vitamin D Capsules and Tablets °What is this medication? °VITAMIN D (VAHY tuh min D) prevents and treats low vitamin D levels in your body. It works by increasing the amount of calcium absorbed by your body. Vitamin D and calcium help build and maintain the health of your bones. Vitamin D also plays an important role in supporting your immune system and brain health. °This medicine may be used for other purposes; ask your health care provider or pharmacist if you have questions. °COMMON BRAND NAME(S): DECARA, Deltalin, Dialyvite, Dialyvite Vitamin D, Dialyvite Vitamin D3, Drisdol, Ergo D, Happy Sunshine Vitamin D3, MAXIMUM D3, PureMark Naturals Vitamin D, Super Happy SUNSHINE Vitamin D3, Thera-D 2000, Thera-D 4000, Thera-D Rapid Repletion, THERA-D SPORT °What should I tell my care team before I take this medication? °They need to know if you have any of the following conditions: °Cystic fibrosis °Gallbladder disease °High levels of calcium in the blood °High levels of vitamin D in the blood °Inflammatory bowel disease such, as Crohn's disease or ulcerative colitis °Kidney disease °Liver disease °Parathyroid disease °Other stomach disease °An unusual or allergic reaction to vitamin D, other medications, foods, dyes, or preservatives °Pregnant or trying to get pregnant °Breast-feeding °How should I use this medication? °Take this medication by mouth with water. Take it as directed on the label at the same time every day. Take it with a meal or snack; for best results, take it with foods that contain fat (i.e., milk, yogurt, cheese). Do not use it more often than directed. °Talk to your care team about the use of this medication in children. Special care may be needed. °Overdosage: If you think you have taken too much of this medicine contact a poison control center or emergency room at once. °NOTE: This medicine is only for you. Do not share this medicine with others. °What if I miss a dose? °If you miss a dose, take  it as soon as you can. If it is almost time for your next dose, take only that dose. Do not take double or extra doses. °What may interact with this medication? °Antacids °Diuretics °Magnesium supplements °Medications for cholesterol, such as cholestyramine, colesevelam, or colestipol °Medications for seizures, such as phenytoin, fosphenytoin °Mineral oil °Orlistat °Phosphorus supplements °Rifampin °This list may not describe all possible interactions. Give your health care provider a list of all the medicines, herbs, non-prescription drugs, or dietary supplements you use. Also tell them if you smoke, drink alcohol, or use illegal drugs. Some items may interact with your medicine. °What should I watch for while using this medication? °Visit your care team for regular checks on your progress. You may need blood work done while you are taking this medication. Tell your care team if your symptoms do not start to get better or if they get worse. °Do not take any non-prescription medications that have vitamin D, phosphorus, magnesium, or calcium including antacids while taking this medication, unless your care team says you can. The extra supplements can cause side effects. °What side effects may I notice from receiving this medication? °Side effects that you should report to your care team as soon as possible: °Allergic reactions--skin rash, itching, hives, swelling of the face, lips, tongue, or throat °High calcium level--increased thirst or amount of urine, nausea, vomiting, confusion, unusual weakness or fatigue, bone pain °Side effects that usually do not require medical attention (report these to your care team if they continue or are bothersome): °Constipation °Loss of appetite °Nausea °This list may not describe   all possible side effects. Call your doctor for medical advice about side effects. You may report side effects to FDA at 1-800-FDA-1088. °Where should I keep my medication? °Keep out of the reach of  children and pets. °Store at room temperature between 15 and 30 degrees C (59 and 86 degrees F). Protect from light. Get rid of any unused medication after the expiration date. °To get rid of medications that are no longer needed or have expired: °Take the medication to a medication take-back program. Check with your pharmacy or law enforcement to find a location. °If you cannot return the medication, check the label or package insert to see if the medication should be thrown out in the garbage or flushed down the toilet. If you are not sure, ask your care team. If it is safe to put it in the trash, take the medication out of the container. Mix the medication with cat litter, dirt, coffee grounds, or other unwanted substance. Seal the mixture in a bag or container. Put it in the trash. °NOTE: This sheet is a summary. It may not cover all possible information. If you have questions about this medicine, talk to your doctor, pharmacist, or health care provider. °© 2022 [employer] (2021-08-05 00:00:00) ° °

## 2021-11-25 NOTE — Progress Notes (Signed)
Subjective:    Patient ID: Autumn Davis, female    DOB: 1986-03-03, 36 y.o.   MRN: [id number]   Chief Complaint: medical management of chronic issues     HPI:  Autumn Davis is a 36 y.o. who identifies as a female who was assigned female at birth.   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 Tachycardia, paroxysmal (HCC) Has had no recent tachycardia symptoms. Is on metoprolol daily.  2. Esophageal dysphagia Has had no problems lately  3. Gastroesophageal reflux disease without esophagitis Is on omepprazole and pepcid daily. Combination works well most days  4. Irritable bowel syndrome with constipation Still alternating between constipation and diarrhea. The baclofen seem sto help with symptoms.  5. Thyroid disease No problems that aware of. Lab Results  Component Value Date   TSH 3.380 05/24/2021     6. Recurrent major depressive disorder, in full remission (HCC) Is currently not on antidepressant. She is dong better sinc eher and he rhusband separated. Depression screen [facility] 2/9 11/25/2021 09/30/2021 06/18/2021  Decreased Interest 1 0 0  Down, Depressed, Hopeless 0 0 0  PHQ - 2 Score 1 0 0  Altered sleeping 1 - 1  Tired, decreased energy 1 - 1  Change in appetite 1 - 0  Feeling bad or failure about yourself  0 - 0  Trouble concentrating 0 - 0  Moving slowly or fidgety/restless 0 - 0  Suicidal thoughts 0 - 0  PHQ-9 Score 4 - 2  Difficult doing work/chores Somewhat difficult - Not difficult at all  Some recent data might be hidden     7. Fibromyalgia Has chronic pain and sees pain management every 3 months.  8. Inflammatory autoimmune disorder (HCC) 9. Systemic lupus erythematosus, unspecified SLE type, unspecified organ involvement status (HCC) Again no one is really sure what auto immune disorder she  has. Sometime tests are positive for lupus and other times they are not. She still sees multiple specialist. There has been no recent changes to her plan of care.  10. Seizures (HCC) She is on daimox daily and has had no recent seizure activity. Sees neurology every 6 months  11. Urinary frequency Is on flomax daily and is working well for her.  12. Vitamin D deficiency Is on daily vitamin d supplement  13. BMI 33.0-33.9,adult No recent  weight changes Wt Readings from Last 3 Encounters:  11/25/21 203 lb (92.1 kg)  09/30/21 211 lb (95.7 kg)  09/10/21 212 lb (96.2 kg)   BMI Readings from Last 3 Encounters:  11/25/21 31.79 kg/m  09/30/21 32.56 kg/m  09/10/21 32.71 kg/m      New complaints: None today  Allergies  Allergen Reactions   Ciprofloxacin Other (See Comments)    Muscle weakness and numbness   Compazine Other (See Comments)    hallucinations   Prochlorperazine Other (See Comments) and Rash    Pt states it makes her feel like things are crawling on her   Outpatient Encounter Medications as of 11/25/2021  Medication Sig   acetaZOLAMIDE (DIAMOX) 250 MG tablet TAKE 1 TABLET EVERY MORNING, TAKE 1 TABLET AT 2PM, AND TAKE 2 TABLETSAT BEDTIME   amitriptyline (ELAVIL) 75 MG tablet Take 1 tablet (75 mg total) by mouth at bedtime.   Ascorbic Acid (VITAMIN C ADULT GUMMIES PO) Take 2 each by  mouth daily.   atorvastatin (LIPITOR) 40 MG tablet TAKE ONE (1) TABLET EACH DAY   baclofen (LIORESAL) 20 MG tablet TAKE ONE TABLET FOUR TIMES DAILY   Calcium-Magnesium-Vitamin D (CALCIUM 1200+D3 PO) Take 1 tablet by mouth daily.   cetirizine (ZYRTEC) 10 MG tablet Take 10 mg by mouth daily.   Cholecalciferol (VITAMIN D3) 50000 units CAPS Take 50,000 Units by mouth once a week.   cyanocobalamin (,VITAMIN B-12,) 1000 MCG/ML injection INJECT IM EVERY 30 DAYS   Cyanocobalamin (B12 FAST DISSOLVE PO) Take by mouth 2 (two) times daily.   cycloSPORINE (RESTASIS) 0.05 % ophthalmic emulsion  Place 1 drop into both eyes 2 (two) times daily.   diclofenac (VOLTAREN) 75 MG EC tablet TAKE ONE TABLET BY MOUTH TWICE DAILY   diclofenac Sodium (VOLTAREN) 1 % GEL Apply 4 g topically 4 (four) times daily.   docusate sodium (COLACE) 100 MG capsule Take 100 mg by mouth 2 (two) times daily. PRN   EPINEPHRINE 0.3 mg/0.3 mL IJ SOAJ injection USE AS DIRECTED   famotidine (PEPCID) 40 MG tablet Take 1 tablet (40 mg total) by mouth at bedtime.   Fluticasone-Umeclidin-Vilant (TRELEGY ELLIPTA) 100-62.5-25 MCG/INH AEPB Inhale 1 puff into the lungs daily.   furosemide (LASIX) 20 MG tablet Take 1 tablet (20 mg total) by mouth daily.   gabapentin (NEURONTIN) 600 MG tablet TAKE 1 TABLET 5 TIMES DAILY   ipratropium-albuterol (DUONEB) 0.5-2.5 (3) MG/3ML SOLN Take 3 mLs by nebulization every 4 (four) hours as needed.   L-Methylfolate 15 MG TABS Take 1 tablet (15 mg total) by mouth daily.   MAGNESIUM GLYCINATE PO Take 2 tablets by mouth daily.   metoprolol succinate (TOPROL-XL) 25 MG 24 hr tablet Take 1 tablet (25 mg total) by mouth daily.   mometasone-formoterol (DULERA) 200-5 MCG/ACT AERO Inhale 2 puffs into the lungs in the morning and at bedtime.   montelukast (SINGULAIR) 10 MG tablet Take 1 tablet (10 mg total) by mouth at bedtime.   Multiple Vitamins-Minerals (ZINC PO) Take 1 tablet by mouth daily.   nystatin (MYCOSTATIN) 100000 UNIT/ML suspension Use as directed 5 mLs (500,000 Units total) in the mouth or throat 4 (four) times daily.   nystatin cream (MYCOSTATIN) Apply 1 application topically 2 (two) times daily.   nystatin-triamcinolone ointment (MYCOLOG) Apply 1 application topically 2 (two) times daily.   Omega 3 1000 MG CAPS Take 1 capsule by mouth daily.   omeprazole (PRILOSEC) 40 MG capsule Take 1 capsule (40 mg total) by mouth 2 (two) times daily.   oxyCODONE-acetaminophen (PERCOCET) 7.5-325 MG tablet Take 1 tablet by mouth 5 (five) times daily as needed for moderate pain. No More Than 5 a day.    predniSONE (DELTASONE) 5 MG tablet Take 1 tablet (5 mg total) by mouth every morning.   promethazine (PHENERGAN) 12.5 MG tablet Take 1 tablet (12.5 mg total) by mouth every 8 (eight) hours as needed for nausea or vomiting.   tamsulosin (FLOMAX) 0.4 MG CAPS capsule TAKE ONE (1) CAPSULE EACH DAY   VENTOLIN HFA 108 (90 Base) MCG/ACT inhaler USE 2 PUFFS EVERY 4 TO 6 HOURS AS NEEDED   Vitamin D, Ergocalciferol, (DRISDOL) 1.25 MG (50000 UNIT) CAPS capsule Take 1 capsule (50,000 Units total) by mouth every 7 (seven) days.   vitamin E 180 MG (400 UNITS) capsule Take 1,000 Units by mouth daily.    Facility-Administered Encounter Medications as of 11/25/2021  Medication   betamethasone acetate-betamethasone sodium phosphate (CELESTONE) injection 3 mg    Past  Surgical History:  Procedure Laterality Date   82 HOUR PH STUDY N/A 10/29/2015   Procedure: 24 HOUR PH STUDY;  Surgeon: Iva Boop, MD;  Location: [facility];  Service: Endoscopy;  Laterality: N/A;   COLONOSCOPY     ESOPHAGEAL MANOMETRY N/A 10/29/2015   Procedure: ESOPHAGEAL MANOMETRY (EM);  Surgeon: Iva Boop, MD;  Location: [facility];  Service: Endoscopy;  Laterality: N/A;   KNEE SURGERY  2002/2003   bil   RECTAL SURGERY  correction of prolapse   2010    Family History  Problem Relation Age of Onset   Thyroid disease Mother    Colon polyps Father    Lung cancer Maternal Grandmother    Cancer Paternal Grandmother        colon/pancreatiec/lymphoma   Irritable bowel syndrome Brother    AAA (abdominal aortic aneurysm) Neg Hx       Controlled substance contract: n/a     Review of Systems  Constitutional:  Negative for diaphoresis.  Eyes:  Negative for pain.  Respiratory:  Negative for shortness of breath.   Cardiovascular:  Negative for chest pain, palpitations and leg swelling.  Gastrointestinal:  Negative for abdominal pain.  Endocrine: Negative for polydipsia.  Skin:  Negative for rash.  Neurological:   Negative for dizziness, weakness and headaches.  Hematological:  Does not bruise/bleed easily.  All other systems reviewed and are negative.     Objective:   Physical Exam Vitals and nursing note reviewed.  Constitutional:      General: She is not in acute distress.    Appearance: Normal appearance. She is well-developed.  HENT:     Head: Normocephalic.     Right Ear: Tympanic membrane normal.     Left Ear: Tympanic membrane normal.     Nose: Nose normal.     Mouth/Throat:     Mouth: Mucous membranes are moist.  Eyes:     Pupils: Pupils are equal, round, and reactive to light.  Neck:     Vascular: No carotid bruit or JVD.  Cardiovascular:     Rate and Rhythm: Normal rate and regular rhythm.     Heart sounds: Normal heart sounds.  Pulmonary:     Effort: Pulmonary effort is normal. No respiratory distress.     Breath sounds: Normal breath sounds. No wheezing or rales.  Chest:     Chest wall: No tenderness.  Abdominal:     General: Bowel sounds are normal. There is no distension or abdominal bruit.     Palpations: Abdomen is soft. There is no hepatomegaly, splenomegaly, mass or pulsatile mass.     Tenderness: There is no abdominal tenderness.  Musculoskeletal:        General: Normal range of motion.     Cervical back: Normal range of motion and neck supple.  Lymphadenopathy:     Cervical: No cervical adenopathy.  Skin:    General: Skin is warm and dry.  Neurological:     Mental Status: She is alert and oriented to person, place, and time.     Deep Tendon Reflexes: Reflexes are normal and symmetric.  Psychiatric:        Behavior: Behavior normal.        Thought Content: Thought content normal.        Judgment: Judgment normal.    BP 121/78    Pulse 71    Temp 98.2 F (36.8 C) (Temporal)    Resp 20    Ht 5\' 7"  (1.702 m)  Wt 203 lb (92.1 kg)    LMP 10/30/2021    SpO2 95%    BMI 31.79 kg/m        Assessment & Plan:   Autumn Davis comes in today with  chief complaint of Medical Management of Chronic Issues   Diagnosis and orders addressed:  1. Tachycardia, paroxysmal (HCC) Avoid caffeine - CBC with Differential/Platelet - CMP14+EGFR - Lipid panel - metoprolol succinate (TOPROL-XL) 25 MG 24 hr tablet; Take 1 tablet (25 mg total) by mouth daily.  Dispense: 90 tablet; Refill: 1  2. Esophageal dysphagia Report any swallowing issues  3. Gastroesophageal reflux disease without esophagitis Avoid spicy foods Do not eat 2 hours prior to bedtime - famotidine (PEPCID) 40 MG tablet; Take 1 tablet (40 mg total) by mouth at bedtime.  Dispense: 30 tablet; Refill: 5 - omeprazole (PRILOSEC) 40 MG capsule; Take 1 capsule (40 mg total) by mouth 2 (two) times daily.  Dispense: 60 capsule; Refill: 5  4. Irritable bowel syndrome with constipation Watch diet to prevent flare up - amitriptyline (ELAVIL) 75 MG tablet; Take 1 tablet (75 mg total) by mouth at bedtime.  Dispense: 90 tablet; Refill: 1 - baclofen (LIORESAL) 20 MG tablet; TAKE ONE TABLET FOUR TIMES DAILY  Dispense: 360 each; Refill: 1  5. Thyroid disease Labs pending - Thyroid Panel With TSH  6. Recurrent major depressive disorder, in full remission [facility]) Stress management  7. Fibromyalgia Keep appointments with follow up  8. Inflammatory autoimmune disorder (HCC) - VITAMIN D 25 Hydroxy (Vit-D Deficiency, Fractures) - predniSONE (DELTASONE) 5 MG tablet; Take 1 tablet (5 mg total) by mouth every morning.  Dispense: 90 tablet; Refill: 1 - gabapentin (NEURONTIN) 600 MG tablet; TAKE 1 TABLET 5 TIMES DAILY  Dispense: 450 tablet; Refill: 1  9. Systemic lupus erythematosus, unspecified SLE type, unspecified organ involvement status (HCC) Keep follow up with specialist  10. Seizures (HCC) Keep follow up with neurology - acetaZOLAMIDE (DIAMOX) 250 MG tablet; TAKE 1 TABLET EVERY MORNING, TAKE 1 TABLET AT 2PM, AND TAKE 2 TABLETSAT BEDTIME  Dispense: 360 tablet; Refill: 1  11. Urinary  frequency - tamsulosin (FLOMAX) 0.4 MG CAPS capsule; TAKE ONE (1) CAPSULE EACH DAY  Dispense: 90 capsule; Refill: 1  12. Vitamin D deficiency Continue daily vitamin d supplement - Vitamin B12  13. BMI 33.0-33.9,adult Discussed diet and exercise for person with BMI >25 Will recheck weight in 3-6 months   14. Mixed hyperlipidemia Low fat diet - atorvastatin (LIPITOR) 40 MG tablet; TAKE ONE (1) TABLET EACH DAY  Dispense: 90 tablet; Refill: 1  15. Peripheral edema Elevat legs when has swelling - furosemide (LASIX) 20 MG tablet; Take 1 tablet (20 mg total) by mouth daily.  Dispense: 90 tablet; Refill: 1   Labs pending Health Maintenance reviewed Diet and exercise encouraged  Follow up plan: 3 months   Autumn Daphine Deutscher, FNP

## 2021-11-26 LAB — LIPID PANEL
Chol/HDL Ratio: 3 ratio (ref 0.0–4.4)
Cholesterol, Total: 214 mg/dL — ABNORMAL HIGH (ref 100–199)
HDL: 72 mg/dL (ref >39)
LDL Chol Calc (NIH): 110 mg/dL — ABNORMAL HIGH (ref 0–99)
Triglycerides: 186 mg/dL — ABNORMAL HIGH (ref 0–149)
VLDL Cholesterol Cal: 32 mg/dL (ref 5–40)

## 2021-11-26 LAB — CMP14+EGFR
ALT: 17 IU/L (ref 0–32)
AST: 17 IU/L (ref 0–40)
Albumin/Globulin Ratio: 2.1 (ref 1.2–2.2)
Albumin: 4.5 g/dL (ref 3.8–4.8)
Alkaline Phosphatase: 85 IU/L (ref 44–121)
BUN/Creatinine Ratio: 12 (ref 9–23)
BUN: 9 mg/dL (ref 6–20)
Bilirubin Total: 0.2 mg/dL (ref 0.0–1.2)
CO2: 18 mmol/L — ABNORMAL LOW (ref 20–29)
Calcium: 9 mg/dL (ref 8.7–10.2)
Chloride: 109 mmol/L — ABNORMAL HIGH (ref 96–106)
Creatinine, Ser: 0.78 mg/dL (ref 0.57–1.00)
Globulin, Total: 2.1 g/dL (ref 1.5–4.5)
Glucose: 106 mg/dL — ABNORMAL HIGH (ref 70–99)
Potassium: 3.8 mmol/L (ref 3.5–5.2)
Sodium: 142 mmol/L (ref 134–144)
Total Protein: 6.6 g/dL (ref 6.0–8.5)
eGFR: 100 mL/min/{1.73_m2} (ref >59)

## 2021-11-26 LAB — CBC WITH DIFFERENTIAL/PLATELET
Basophils Absolute: 0.1 10*3/uL (ref 0.0–0.2)
Basos: 1 %
EOS (ABSOLUTE): 0.1 10*3/uL (ref 0.0–0.4)
Eos: 1 %
Hematocrit: 37.9 % (ref 34.0–46.6)
Hemoglobin: 12.4 g/dL (ref 11.1–15.9)
Immature Grans (Abs): 0 10*3/uL (ref 0.0–0.1)
Immature Granulocytes: 0 %
Lymphocytes Absolute: 1.6 10*3/uL (ref 0.7–3.1)
Lymphs: 17 %
MCH: 29.3 pg (ref 26.6–33.0)
MCHC: 32.7 g/dL (ref 31.5–35.7)
MCV: 90 fL (ref 79–97)
Monocytes Absolute: 0.5 10*3/uL (ref 0.1–0.9)
Monocytes: 5 %
Neutrophils Absolute: 7.2 10*3/uL — ABNORMAL HIGH (ref 1.4–7.0)
Neutrophils: 76 %
Platelets: 293 10*3/uL (ref 150–450)
RBC: 4.23 x10E6/uL (ref 3.77–5.28)
RDW: 13.5 % (ref 11.7–15.4)
WBC: 9.5 10*3/uL (ref 3.4–10.8)

## 2021-11-26 LAB — THYROID PANEL WITH TSH
Free Thyroxine Index: 1.7 (ref 1.2–4.9)
T3 Uptake Ratio: 24 % (ref 24–39)
T4, Total: 7.1 ug/dL (ref 4.5–12.0)
TSH: 1.78 u[IU]/mL (ref 0.450–4.500)

## 2021-11-26 LAB — VITAMIN B12: Vitamin B-12: 534 pg/mL (ref 232–1245)

## 2021-11-26 LAB — VITAMIN D 25 HYDROXY (VIT D DEFICIENCY, FRACTURES): Vit D, 25-Hydroxy: 39 ng/mL (ref 30.0–100.0)

## 2021-12-03 ENCOUNTER — Other Ambulatory Visit: Payer: Self-pay

## 2021-12-03 ENCOUNTER — Ambulatory Visit: Payer: Medicare Other | Admitting: Allergy and Immunology

## 2021-12-03 VITALS — BP 108/76 | HR 87 | Temp 97.3°F | Resp 18 | Ht 68.5 in | Wt 204.8 lb

## 2021-12-03 DIAGNOSIS — J4540 Moderate persistent asthma, uncomplicated: Secondary | ICD-10-CM | POA: Diagnosis not present

## 2021-12-03 DIAGNOSIS — K219 Gastro-esophageal reflux disease without esophagitis: Secondary | ICD-10-CM | POA: Diagnosis not present

## 2021-12-03 DIAGNOSIS — J383 Other diseases of vocal cords: Secondary | ICD-10-CM

## 2021-12-03 MED ORDER — MOMETASONE FURO-FORMOTEROL FUM 200-5 MCG/ACT IN AERO: 2.0000 | INHALATION_SPRAY | Freq: Two times a day (BID) | RESPIRATORY_TRACT | 5 refills | Status: DC

## 2021-12-03 MED ORDER — CETIRIZINE HCL 10 MG PO TABS: 10.0000 mg | ORAL_TABLET | Freq: Two times a day (BID) | ORAL | 5 refills | Status: DC | PRN

## 2021-12-03 NOTE — Patient Instructions (Addendum)
°  1.  Allergen avoidance measures  - dust mite  2.  Continue to treat and prevent inflammation:  A.  Dulera 200 - 2 inhalations 1-2 times per day w/ spacer  B.  Montelukast 10 mg -1 tablet 1 time per day  3.  Treat and prevent reflux/LPR:  A.  Minimize all forms of caffeine consumption B.  Omeprazole 40 mg -1 tablet twice a day C.  Famotidine 40 mg -1 tablet in evening D.  Replace throat clearing with swallowing maneuver E.  Nissen fundoplication???  4.  If needed:  A. Albuterol HFA - 2 inhalations or nebulization every 4-6 hours  5.  Return to clinic in 6 months or earlier if problem

## 2021-12-03 NOTE — Progress Notes (Signed)
[place] - [place] - [place] - [name] - [name]   Follow-up Note  Referring Provider: [name], * Primary Provider: [name], FNP Date of Office Visit: 12/03/2021  Subjective:   Autumn Davis (DOB: [birth year]) is a 36 y.o. female who [facility] in re-evaluation of the following:  HPI: Autumn Davis presents to this clinic in evaluation of asthma, LPR, vocal cord dysfunction in the context of chronic systemic steroid use for a connective tissue disease.Marland Kitchen  Her last visit to this clinic was her initial evaluation of of 10 September 2021.  She has dramatically improved regarding all of her respiratory tract symptoms.  She does not have any "asthma attacks" and she has had very little problems with her throat especially her throat clearing and she has not had laryngeal spasm and she rarely uses a short acting bronchodilator.  She has not required a systemic steroid or antibiotic for any type of airway issue since her last visit.  Currently she continues on Dulera and montelukast and a combination of a proton pump inhibitor and H2 receptor blocker.  She has consolidated her caffeine consumption to just a half of [facility] per day and has basically eliminated chocolate consumption.  She still has horrendous classic reflux symptoms even on her current plan of therapy.  She refluxes with every meal and she refluxes with water.  She has had a upper endoscopy performed several years ago and apparently she was told that she has a hiatal hernia.  She already sleeps in a reclining position.  She still continues on low-dose prednisone at 5 mg a day for her connective tissue disease.  She has received this year's flu vaccine.  Allergies as of 12/03/2021       Reactions   Ciprofloxacin Other (See Comments)   Muscle weakness and numbness   Compazine Other (See Comments)   hallucinations   Prochlorperazine Other (See  Comments), Rash   Pt states it makes her feel like things are crawling on her        Medication List    acetaZOLAMIDE 250 MG tablet Commonly known as: DIAMOX TAKE 1 TABLET EVERY MORNING, TAKE 1 TABLET AT 2PM, AND TAKE 2 TABLETSAT BEDTIME   amitriptyline 75 MG tablet Commonly known as: ELAVIL Take 1 tablet (75 mg total) by mouth at bedtime.   atorvastatin 40 MG tablet Commonly known as: LIPITOR TAKE ONE (1) TABLET EACH DAY   baclofen 20 MG tablet Commonly known as: LIORESAL TAKE ONE TABLET FOUR TIMES DAILY   CALCIUM 1200+D3 PO Take 1 tablet by mouth daily.   cetirizine 10 MG tablet Commonly known as: ZYRTEC Take 10 mg by mouth daily.   cyanocobalamin 1000 MCG/ML injection Commonly known as: (VITAMIN B-12) INJECT IM EVERY 30 DAYS   cycloSPORINE 0.05 % ophthalmic emulsion Commonly known as: RESTASIS Place 1 drop into both eyes 2 (two) times daily.   diclofenac 75 MG EC tablet Commonly known as: VOLTAREN TAKE ONE TABLET BY MOUTH TWICE DAILY   diclofenac Sodium 1 % Gel Commonly known as: Voltaren Apply 4 g topically 4 (four) times daily.   docusate sodium 100 MG capsule Commonly known as: COLACE Take 100 mg by mouth 2 (two) times daily. PRN   EPINEPHrine 0.3 mg/0.3 mL Soaj injection Commonly known as: EPI-PEN USE AS DIRECTED   famotidine 40 MG tablet Commonly known as: PEPCID Take 1 tablet (40 mg total) by mouth at bedtime.   furosemide  20 MG tablet Commonly known as: LASIX Take 1 tablet (20 mg total) by mouth daily.   gabapentin 600 MG tablet Commonly known as: NEURONTIN TAKE 1 TABLET 5 TIMES DAILY   ipratropium-albuterol 0.5-2.5 (3) MG/3ML Soln Commonly known as: DUONEB Take 3 mLs by nebulization every 4 (four) hours as needed.   L-Methylfolate 15 MG Tabs Take 1 tablet (15 mg total) by mouth daily.   MAGNESIUM GLYCINATE PO Take 2 tablets by mouth daily.   metoprolol succinate 25 MG 24 hr tablet Commonly known as: TOPROL-XL Take 1 tablet  (25 mg total) by mouth daily.   mometasone-formoterol 200-5 MCG/ACT Aero Commonly known as: DULERA Inhale 2 puffs into the lungs in the morning and at bedtime.   montelukast 10 MG tablet Commonly known as: SINGULAIR Take 1 tablet (10 mg total) by mouth at bedtime.   nystatin 100000 UNIT/ML suspension Commonly known as: MYCOSTATIN Use as directed 5 mLs (500,000 Units total) in the mouth or throat 4 (four) times daily.   nystatin cream Commonly known as: MYCOSTATIN Apply 1 application topically 2 (two) times daily.   nystatin-triamcinolone ointment Commonly known as: MYCOLOG Apply 1 application topically 2 (two) times daily.   Omega 3 1000 MG Caps Take 1 capsule by mouth daily.   omeprazole 40 MG capsule Commonly known as: PRILOSEC Take 1 capsule (40 mg total) by mouth 2 (two) times daily.   oxyCODONE-acetaminophen 7.5-325 MG tablet Commonly known as: Percocet Take 1 tablet by mouth 5 (five) times daily as needed for moderate pain. No More Than 5 a day.   predniSONE 5 MG tablet Commonly known as: DELTASONE Take 1 tablet (5 mg total) by mouth every morning.   promethazine 12.5 MG tablet Commonly known as: PHENERGAN Take 1 tablet (12.5 mg total) by mouth every 8 (eight) hours as needed for nausea or vomiting.   tamsulosin 0.4 MG Caps capsule Commonly known as: FLOMAX TAKE ONE (1) CAPSULE EACH DAY   Ventolin HFA 108 (90 Base) MCG/ACT inhaler Generic drug: albuterol USE 2 PUFFS EVERY 4 TO 6 HOURS AS NEEDED   VITAMIN C ADULT GUMMIES PO Take 2 each by mouth daily.   Vitamin D (Ergocalciferol) 1.25 MG (50000 UNIT) Caps capsule Commonly known as: DRISDOL Take 1 capsule (50,000 Units total) by mouth every 7 (seven) days.   Vitamin D3 1.25 MG (50000 UT) Caps Take 50,000 Units by mouth once a week.   vitamin E 180 MG (400 UNITS) capsule Take 1,000 Units by mouth daily.   ZINC PO Take 1 tablet by mouth daily.    Past Medical History:  Diagnosis Date   Allergy     Arthritis    HANDS,HIPS,KNEES   Bronchitis, chronic/intermittent 01/22/2012   Depression 01/22/2012   GERD (gastroesophageal reflux disease)    Hyperlipidemia    IBS (irritable bowel syndrome) 01/22/2012   Lupus (HCC)    Neuromuscular disorder (HCC)    Osteoporosis    Seizures (HCC)    SOB (shortness of breath)    Thyroid disease     Past Surgical History:  Procedure Laterality Date   74 HOUR PH STUDY N/A 10/29/2015   Procedure: 24 HOUR PH STUDY;  Surgeon: Iva Boop, MD;  Location: [facility];  Service: Endoscopy;  Laterality: N/A;   COLONOSCOPY     ESOPHAGEAL MANOMETRY N/A 10/29/2015   Procedure: ESOPHAGEAL MANOMETRY (EM);  Surgeon: Iva Boop, MD;  Location: [facility];  Service: Endoscopy;  Laterality: N/A;   KNEE SURGERY  2002/2003  bil   RECTAL SURGERY  correction of prolapse   2010    Review of systems negative except as noted in HPI / PMHx or noted below:  Review of Systems  Constitutional: Negative.   HENT: Negative.    Eyes: Negative.   Respiratory: Negative.    Cardiovascular: Negative.   Gastrointestinal: Negative.   Genitourinary: Negative.   Musculoskeletal: Negative.   Skin: Negative.   Neurological: Negative.   Endo/Heme/Allergies: Negative.   Psychiatric/Behavioral: Negative.      Objective:   Vitals:   12/03/21 1039  BP: 108/76  Pulse: 87  Resp: 18  Temp: (!) 97.3 F (36.3 C)  SpO2: 96%   Height: 5' 8.5" (174 cm)  Weight: 204 lb 12.8 oz (92.9 kg)   Physical Exam Constitutional:      Appearance: She is not diaphoretic.  HENT:     Head: Normocephalic.     Right Ear: Tympanic membrane, ear canal and external ear normal.     Left Ear: Tympanic membrane, ear canal and external ear normal.     Nose: Nose normal. No mucosal edema or rhinorrhea.     Mouth/Throat:     Pharynx: Uvula midline. No oropharyngeal exudate.  Eyes:     Conjunctiva/sclera: Conjunctivae normal.  Neck:     Thyroid: No thyromegaly.     Trachea: Trachea  normal. No tracheal tenderness or tracheal deviation.  Cardiovascular:     Rate and Rhythm: Normal rate and regular rhythm.     Heart sounds: Normal heart sounds, S1 normal and S2 normal. No murmur heard. Pulmonary:     Effort: No respiratory distress.     Breath sounds: Normal breath sounds. No stridor. No wheezing or rales.  Lymphadenopathy:     Head:     Right side of head: No tonsillar adenopathy.     Left side of head: No tonsillar adenopathy.     Cervical: No cervical adenopathy.  Skin:    Findings: No erythema or rash.     Nails: There is no clubbing.  Neurological:     Mental Status: She is alert.    Diagnostics:    Spirometry was performed and demonstrated an FEV1 of 3.17 at 91 % of predicted.  The patient had an Asthma Control Test with the following results: ACT Total Score: 17.    Assessment and Plan:   1. Asthma, moderate persistent, well-controlled   2. LPRD (laryngopharyngeal reflux disease)   3. Vocal cord dysfunction     1.  Allergen avoidance measures  - dust mite  2.  Continue to treat and prevent inflammation:  A.  Dulera 200 - 2 inhalations 1-2 times per day w/ spacer  B.  Montelukast 10 mg -1 tablet 1 time per day  3.  Treat and prevent reflux/LPR:  A.  Minimize all forms of caffeine consumption B.  Omeprazole 40 mg -1 tablet twice a day C.  Famotidine 40 mg -1 tablet in evening D.  Replace throat clearing with swallowing maneuver E.  Nissen fundoplication???  4.  If needed:  A. Albuterol HFA - 2 inhalations or nebulization every 4-6 hours  5.  Return to clinic in 6 months or earlier if problem  Daiana is doing much better regarding her respiratory tract inflammation and irritation from a combination of atopic disease and reflux induced respiratory disease while using therapy directed against respiratory tract inflammation and very aggressive therapy directed against reflux.  There may be an opportunity to consolidate some of her medical  treatment by decreasing her Dulera to 1 time per day.  Her classic reflux is not under control and I wonder if she would benefit from a Nissen fundoplication and I have asked her to discuss this issue with her gastroenterologist.  Assuming she does okay with the plan noted above I will see her back in this clinic in 6 months or earlier if there is a problem  Laurette Schimke, MD Allergy / Immunology [facility]

## 2021-12-04 ENCOUNTER — Encounter: Payer: Self-pay | Admitting: Allergy and Immunology

## 2021-12-10 ENCOUNTER — Encounter: Payer: Self-pay | Admitting: Registered Nurse

## 2021-12-10 ENCOUNTER — Encounter: Payer: Medicare Other | Attending: Registered Nurse | Admitting: Registered Nurse

## 2021-12-10 ENCOUNTER — Other Ambulatory Visit: Payer: Self-pay

## 2021-12-10 VITALS — BP 116/77 | HR 77 | Temp 98.7°F | Ht 68.0 in | Wt 200.0 lb

## 2021-12-10 DIAGNOSIS — M62838 Other muscle spasm: Secondary | ICD-10-CM | POA: Diagnosis not present

## 2021-12-10 DIAGNOSIS — M7062 Trochanteric bursitis, left hip: Secondary | ICD-10-CM | POA: Diagnosis not present

## 2021-12-10 DIAGNOSIS — Z5181 Encounter for therapeutic drug level monitoring: Secondary | ICD-10-CM

## 2021-12-10 DIAGNOSIS — G609 Hereditary and idiopathic neuropathy, unspecified: Secondary | ICD-10-CM

## 2021-12-10 DIAGNOSIS — Z79891 Long term (current) use of opiate analgesic: Secondary | ICD-10-CM | POA: Diagnosis not present

## 2021-12-10 DIAGNOSIS — M797 Fibromyalgia: Secondary | ICD-10-CM

## 2021-12-10 DIAGNOSIS — R569 Unspecified convulsions: Secondary | ICD-10-CM

## 2021-12-10 DIAGNOSIS — M546 Pain in thoracic spine: Secondary | ICD-10-CM | POA: Insufficient documentation

## 2021-12-10 DIAGNOSIS — G894 Chronic pain syndrome: Secondary | ICD-10-CM | POA: Diagnosis not present

## 2021-12-10 DIAGNOSIS — G8929 Other chronic pain: Secondary | ICD-10-CM

## 2021-12-10 MED ORDER — OXYCODONE-ACETAMINOPHEN 7.5-325 MG PO TABS: 1.0000 | ORAL_TABLET | Freq: Every day | ORAL | 0 refills | Status: DC | PRN

## 2021-12-10 NOTE — Progress Notes (Signed)
Subjective:    Patient ID: Autumn Davis, female    DOB: [birth year], 36 y.o.   MRN: [id number]  HPI: Autumn Davis is a 36 y.o. female who returns for follow up appointment for chronic pain and medication refill. She states her pain is located in her mid back and bilateral hip pain. She rates her pain 6. Her current exercise regime is walking and performing stretching exercises.  Autumn Davis Morphine equivalent is 56.25 MME.   UDS  ordered today.    Pain Inventory Average Pain 8 Pain Right Now 6 My pain is constant, sharp, burning, dull, stabbing, tingling, and aching  In the last 24 hours, has pain interfered with the following? General activity 7 Relation with others 5 Enjoyment of life 8 What TIME of day is your pain at its worst? evening and night Sleep (in general) Poor  Pain is worse with: walking, bending, sitting, inactivity, standing, and some activites Pain improves with: rest, heat/ice, therapy/exercise, pacing activities, medication, and TENS Relief from Meds: 7  Family History  Problem Relation Age of Onset   Thyroid disease Mother    Colon polyps Father    Lung cancer Maternal Grandmother    Cancer Paternal Grandmother        colon/pancreatiec/lymphoma   Irritable bowel syndrome Brother    AAA (abdominal aortic aneurysm) Neg Hx    Social History   Socioeconomic History   Marital status: Legally Separated    Spouse name: Not on file   Number of children: 1   Years of education: Not on file   Highest education level: Not on file  Occupational History   Occupation: disabled    Employer: [employer]  Tobacco Use   Smoking status: Never   Smokeless tobacco: Never  Vaping Use   Vaping Use: Never used  Substance and Sexual Activity   Alcohol use: No    Alcohol/week: 0.0 standard drinks   Drug use: No   Sexual activity: Not on file  Other Topics Concern   Not on file  Social History Narrative   She lives alone with  her 52 year old son, but they stay with her parents a lot   Social Determinants of Corporate investment banker Strain: Low Risk    Difficulty of Paying Living Expenses: Not hard at all  Food Insecurity: No Food Insecurity   Worried About Programme researcher, broadcasting/film/video in the Last Year: Never true   Barista in the Last Year: Never true  Transportation Needs: No Transportation Needs   Lack of Transportation (Medical): No   Lack of Transportation (Non-Medical): No  Physical Activity: Inactive   Days of Exercise per Week: 0 days   Minutes of Exercise per Session: 0 min  Stress: No Stress Concern Present   Feeling of Stress : Not at all  Social Connections: Unknown   Frequency of Communication with Friends and Family: More than three times a week   Frequency of Social Gatherings with Friends and Family: More than three times a week   Attends Religious Services: Not on file   Active Member of Clubs or Organizations: Not on file   Attends Banker Meetings: Not on file   Marital Status: Separated   Past Surgical History:  Procedure Laterality Date   24 HOUR PH STUDY N/A 10/29/2015   Procedure: 24 HOUR PH STUDY;  Surgeon: Iva Boop, MD;  Location: [facility];  Service: Endoscopy;  Laterality: N/A;   COLONOSCOPY     ESOPHAGEAL MANOMETRY N/A 10/29/2015   Procedure: ESOPHAGEAL MANOMETRY (EM);  Surgeon: Iva Boop, MD;  Location: [facility];  Service: Endoscopy;  Laterality: N/A;   KNEE SURGERY  2002/2003   bil   RECTAL SURGERY  correction of prolapse   2010   Past Surgical History:  Procedure Laterality Date   80 HOUR PH STUDY N/A 10/29/2015   Procedure: 24 HOUR PH STUDY;  Surgeon: Iva Boop, MD;  Location: [facility];  Service: Endoscopy;  Laterality: N/A;   COLONOSCOPY     ESOPHAGEAL MANOMETRY N/A 10/29/2015   Procedure: ESOPHAGEAL MANOMETRY (EM);  Surgeon: Iva Boop, MD;  Location: [facility];  Service: Endoscopy;  Laterality: N/A;   KNEE SURGERY   2002/2003   bil   RECTAL SURGERY  correction of prolapse   2010   Past Medical History:  Diagnosis Date   Allergy    Arthritis    HANDS,HIPS,KNEES   Bronchitis, chronic/intermittent 01/22/2012   Depression 01/22/2012   GERD (gastroesophageal reflux disease)    Hyperlipidemia    IBS (irritable bowel syndrome) 01/22/2012   Lupus (HCC)    Neuromuscular disorder (HCC)    Osteoporosis    Seizures (HCC)    SOB (shortness of breath)    Thyroid disease    BP 116/77    Pulse 77    Temp 98.7 F (37.1 C)    Ht 5\' 8"  (1.727 m)    Wt 200 lb (90.7 kg)    SpO2 97%    BMI 30.41 kg/m   Opioid Risk Score:   Fall Risk Score:  `1  Depression screen PHQ 2/9  Depression screen [facility] 2/9 11/25/2021 09/30/2021 06/18/2021 06/05/2021 05/24/2021 04/30/2021 02/18/2021  Decreased Interest 1 0 0 0 1 0 1  Down, Depressed, Hopeless 0 0 0 0 0 0 0  PHQ - 2 Score 1 0 0 0 1 0 1  Altered sleeping 1 - 1 - 1 - 0  Tired, decreased energy 1 - 1 - 1 - 0  Change in appetite 1 - 0 - 0 - 1  Feeling bad or failure about yourself  0 - 0 - 0 - 0  Trouble concentrating 0 - 0 - 0 - 0  Moving slowly or fidgety/restless 0 - 0 - 0 - 0  Suicidal thoughts 0 - 0 - 0 - 0  PHQ-9 Score 4 - 2 - 3 - 2  Difficult doing work/chores Somewhat difficult - Not difficult at all - Not difficult at all - Not difficult at all  Some recent data might be hidden       Review of Systems  Constitutional: Negative.   HENT: Negative.    Eyes: Negative.   Respiratory: Negative.    Cardiovascular: Negative.   Gastrointestinal: Negative.   Endocrine: Negative.   Genitourinary: Negative.   Musculoskeletal:  Positive for back pain and gait problem.       Pain in both hips , pain in both hands , pain in ribs , posterior and anteior.  Skin: Negative.   Allergic/Immunologic: Negative.   Hematological: Negative.   Psychiatric/Behavioral: Negative.        Objective:   Physical Exam Vitals and nursing note reviewed.  Constitutional:      Appearance:  Normal appearance.  Cardiovascular:     Rate and Rhythm: Normal rate and regular rhythm.  Pulmonary:     Effort: Pulmonary effort is normal.     Breath sounds:  Normal breath sounds.  Musculoskeletal:     Cervical back: Neck supple.     Comments: Normal Muscle Bulk and Muscle Testing Reveals:  Upper Extremities: Full ROM and Muscle Strength 5/5 Thoracic Paraspinal Tenderness: T-7-T-9 Bilateral Greater Trochanter Tenderness Lower Extremities: Full ROM and Muscle Strength 5/5 Narrow Based Gait     Skin:    General: Skin is warm and dry.  Neurological:     Mental Status: She is alert and oriented to person, place, and time.  Psychiatric:        Mood and Affect: Mood normal.        Behavior: Behavior normal.         Assessment & Plan:  1. Chronic seizure disorder: No seizure's. Continue current medication regimen with  Gabapentin. Neurology Following. 12/10/2021. 2. Chronic muscle spasms, weakness with associated pain disorder: Continue current medication regimen with Baclofen. Continue with Exercise regime. 12/10/2021. 3. Chronic dysphagia: No complaints today. Continue to monitor. GI Following. 12/10/2021. 4. Anxiety with depression : No complaints today. Stable. Continue current medication regimen with  Elavil. 12/10/2021. 5. Fibromyalgia/ Rib Pain/Chronic Pain: Continue Diclofenac: Continue with exercise and heat Therapy. 12/10/2021. Refilled:  oxyCODONE 7.5/325mg  one tablet 5 times  daily as needed  #140. Second script sent for the following month. Continue with slow weaning.   We will continue the opioid monitoring program, this consists of regular clinic visits, examinations, urine drug screen, pill counts as well as use of [place] Controlled Substance Reporting system. A 12 month History has been reviewed on the [place] Controlled Substance Reporting System 12/10/2021.  6. Peripheral Neuropathy: Continue current medication regimen with  Gabapentin: 12/10/2021.    F/U in 2 months

## 2021-12-16 LAB — TOXASSURE SELECT,+ANTIDEPR,UR

## 2021-12-17 ENCOUNTER — Telehealth: Payer: Self-pay | Admitting: *Deleted

## 2021-12-17 NOTE — Telephone Encounter (Signed)
Urine drug screen for this encounter is consistent for prescribed medication 

## 2021-12-22 ENCOUNTER — Other Ambulatory Visit: Payer: Self-pay | Admitting: Nurse Practitioner

## 2021-12-23 NOTE — Telephone Encounter (Signed)
Patient tested positive for covid today. Cannot come in for visit. She started getting sick yesterday with  high fever and body aches. She is on so many meds that she does not want antiviral at this time.  Patient will call back if no better.  Autumn Hassell Done, FNP

## 2022-02-03 ENCOUNTER — Other Ambulatory Visit: Payer: Self-pay | Admitting: Nurse Practitioner

## 2022-02-11 ENCOUNTER — Encounter: Payer: Medicare Other | Admitting: Registered Nurse

## 2022-02-24 ENCOUNTER — Encounter: Payer: Self-pay | Admitting: Registered Nurse

## 2022-02-24 ENCOUNTER — Encounter: Payer: Medicare Other | Attending: Registered Nurse | Admitting: Registered Nurse

## 2022-02-24 VITALS — BP 120/85 | HR 80 | Ht 68.0 in | Wt 201.0 lb

## 2022-02-24 DIAGNOSIS — G8929 Other chronic pain: Secondary | ICD-10-CM | POA: Insufficient documentation

## 2022-02-24 DIAGNOSIS — G609 Hereditary and idiopathic neuropathy, unspecified: Secondary | ICD-10-CM | POA: Insufficient documentation

## 2022-02-24 DIAGNOSIS — G894 Chronic pain syndrome: Secondary | ICD-10-CM | POA: Diagnosis not present

## 2022-02-24 DIAGNOSIS — Z5181 Encounter for therapeutic drug level monitoring: Secondary | ICD-10-CM | POA: Diagnosis not present

## 2022-02-24 DIAGNOSIS — M62838 Other muscle spasm: Secondary | ICD-10-CM | POA: Insufficient documentation

## 2022-02-24 DIAGNOSIS — M7062 Trochanteric bursitis, left hip: Secondary | ICD-10-CM | POA: Insufficient documentation

## 2022-02-24 DIAGNOSIS — M7061 Trochanteric bursitis, right hip: Secondary | ICD-10-CM | POA: Diagnosis not present

## 2022-02-24 DIAGNOSIS — M797 Fibromyalgia: Secondary | ICD-10-CM | POA: Diagnosis not present

## 2022-02-24 DIAGNOSIS — Z79891 Long term (current) use of opiate analgesic: Secondary | ICD-10-CM | POA: Diagnosis not present

## 2022-02-24 DIAGNOSIS — R569 Unspecified convulsions: Secondary | ICD-10-CM | POA: Insufficient documentation

## 2022-02-24 DIAGNOSIS — M546 Pain in thoracic spine: Secondary | ICD-10-CM | POA: Insufficient documentation

## 2022-02-24 MED ORDER — OXYCODONE-ACETAMINOPHEN 7.5-325 MG PO TABS: 1.0000 | ORAL_TABLET | Freq: Every day | ORAL | 0 refills | Status: DC | PRN

## 2022-02-24 NOTE — Progress Notes (Signed)
? ?Subjective:  ? ? Patient ID: Autumn Davis, female    DOB: 06/20/1986, 36 y.o.   MRN: [id number] ? ?HPI: Autumn Davis is a 36 y.o. female who returns for follow up appointment for chronic pain and medication refill. She states her pain is located in her mid- thoracic  and bilateral hip pain. She rates her pain 4. Her current exercise regime is walking and performing stretching exercises. ? ?Ms. Holcomb [name] Morphine equivalent is 56.25 MME.   Last UDS was Performed on 12/10/2021, it was consistent.  ?  ?Pain Inventory ?Average Pain 7 ?Pain Right Now 4 ?My pain is constant, sharp, burning, dull, stabbing, and tingling ? ?In the last 24 hours, has pain interfered with the following? ?General activity 7 ?Relation with others 6 ?Enjoyment of life 7 ?What TIME of day is your pain at its worst? evening and night ?Sleep (in general) Poor ? ?Pain is worse with: walking, bending, sitting, inactivity, standing, and some activites ?Pain improves with: rest, heat/ice, therapy/exercise, pacing activities, medication, and injections ?Relief from Meds: 7 ? ?Family History  ?Problem Relation Age of Onset  ? Thyroid disease Mother   ? Colon polyps Father   ? Lung cancer Maternal Grandmother   ? Cancer Paternal Grandmother   ?     colon/pancreatiec/lymphoma  ? Irritable bowel syndrome Brother   ? AAA (abdominal aortic aneurysm) Neg Hx   ? ?Social History  ? ?Socioeconomic History  ? Marital status: Legally Separated  ?  Spouse name: Not on file  ? Number of children: 1  ? Years of education: Not on file  ? Highest education level: Not on file  ?Occupational History  ? Occupation: disabled  ?  Employer: [employer]  ?Tobacco Use  ? Smoking status: Never  ? Smokeless tobacco: Never  ?Vaping Use  ? Vaping Use: Never used  ?Substance and Sexual Activity  ? Alcohol use: No  ?  Alcohol/week: 0.0 standard drinks  ? Drug use: No  ? Sexual activity: Not on file  ?Other Topics Concern  ? Not on file  ?Social  History Narrative  ? She lives alone with her 60 year old son, but they stay with her parents a lot  ? ?Social Determinants of Health  ? ?Financial Resource Strain: Low Risk   ? Difficulty of Paying Living Expenses: Not hard at all  ?Food Insecurity: No Food Insecurity  ? Worried About Programme researcher, broadcasting/film/video in the Last Year: Never true  ? Ran Out of Food in the Last Year: Never true  ?Transportation Needs: No Transportation Needs  ? Lack of Transportation (Medical): No  ? Lack of Transportation (Non-Medical): No  ?Physical Activity: Inactive  ? Days of Exercise per Week: 0 days  ? Minutes of Exercise per Session: 0 min  ?Stress: No Stress Concern Present  ? Feeling of Stress : Not at all  ?Social Connections: Unknown  ? Frequency of Communication with Friends and Family: More than three times a week  ? Frequency of Social Gatherings with Friends and Family: More than three times a week  ? Attends Religious Services: Not on file  ? Active Member of Clubs or Organizations: Not on file  ? Attends Banker Meetings: Not on file  ? Marital Status: Separated  ? ?Past Surgical History:  ?Procedure Laterality Date  ? 56 HOUR PH STUDY N/A 10/29/2015  ? Procedure: 24 HOUR PH STUDY;  Surgeon: Iva Boop, MD;  Location:  [facility];  Service: Endoscopy;  Laterality: N/A;  ? COLONOSCOPY    ? ESOPHAGEAL MANOMETRY N/A 10/29/2015  ? Procedure: ESOPHAGEAL MANOMETRY (EM);  Surgeon: Iva Boop, MD;  Location: [facility];  Service: Endoscopy;  Laterality: N/A;  ? KNEE SURGERY  2002/2003  ? bil  ? RECTAL SURGERY  correction of prolapse  ? 2010  ? ?Past Surgical History:  ?Procedure Laterality Date  ? 32 HOUR PH STUDY N/A 10/29/2015  ? Procedure: 24 HOUR PH STUDY;  Surgeon: Iva Boop, MD;  Location: [facility];  Service: Endoscopy;  Laterality: N/A;  ? COLONOSCOPY    ? ESOPHAGEAL MANOMETRY N/A 10/29/2015  ? Procedure: ESOPHAGEAL MANOMETRY (EM);  Surgeon: Iva Boop, MD;  Location: [facility];  Service:  Endoscopy;  Laterality: N/A;  ? KNEE SURGERY  2002/2003  ? bil  ? RECTAL SURGERY  correction of prolapse  ? 2010  ? ?Past Medical History:  ?Diagnosis Date  ? Allergy   ? Arthritis   ? HANDS,HIPS,KNEES  ? Bronchitis, chronic/intermittent 01/22/2012  ? Depression 01/22/2012  ? GERD (gastroesophageal reflux disease)   ? Hyperlipidemia   ? IBS (irritable bowel syndrome) 01/22/2012  ? Lupus (HCC)   ? Neuromuscular disorder (HCC)   ? Osteoporosis   ? Seizures (HCC)   ? SOB (shortness of breath)   ? Thyroid disease   ? ?BP 120/85   Pulse 80   Ht 5\' 8"  (1.727 m)   Wt 201 lb (91.2 kg)   SpO2 98%   BMI 30.56 kg/m?  ? ?Opioid Risk Score:   ?Fall Risk Score:  `1 ? ?Depression screen PHQ 2/9 ? ? ?  02/24/2022  ?  8:32 AM 12/10/2021  ? 11:32 AM 11/25/2021  ?  1:58 PM 09/30/2021  ?  1:08 PM 06/18/2021  ?  2:36 PM 06/05/2021  ? 12:35 PM 05/24/2021  ?  2:15 PM  ?Depression screen PHQ 2/9  ?Decreased Interest 0 0 1 0 0 0 1  ?Down, Depressed, Hopeless 0 0 0 0 0 0 0  ?PHQ - 2 Score 0 0 1 0 0 0 1  ?Altered sleeping   1  1  1   ?Tired, decreased energy   1  1  1   ?Change in appetite   1  0  0  ?Feeling bad or failure about yourself    0  0  0  ?Trouble concentrating   0  0  0  ?Moving slowly or fidgety/restless   0  0  0  ?Suicidal thoughts   0  0  0  ?PHQ-9 Score   4  2  3   ?Difficult doing work/chores   Somewhat difficult  Not difficult at all  Not difficult at all  ?  ?Review of Systems  ?Musculoskeletal:  Positive for back pain.  ?     Hands, feet   ?All other systems reviewed and are negative. ? ?   ?Objective:  ? Physical Exam ?Vitals and nursing note reviewed.  ?Constitutional:   ?   Appearance: Normal appearance.  ?Cardiovascular:  ?   Rate and Rhythm: Normal rate and regular rhythm.  ?   Pulses: Normal pulses.  ?   Heart sounds: Normal heart sounds.  ?Pulmonary:  ?   Effort: Pulmonary effort is normal.  ?   Breath sounds: Normal breath sounds.  ?Musculoskeletal:  ?   Cervical back: Normal range of motion and neck supple.  ?   Comments:  Normal Muscle Bulk and Muscle  Testing Reveals:  ?Upper Extremities: Full ROM and Muscle Strength 5/5  ? Thoracic  Paraspinal Tenderness: T-3-T-6 ?Bilateral Greater Trochanter Tenderness ? Lower Extremities: Full ROM and Muscle Strength 5/5 ?Arises From Table with Ease ?Narrow Based  Gait  ?   ?Skin: ?   General: Skin is warm and dry.  ?Neurological:  ?   Mental Status: She is alert and oriented to person, place, and time.  ?Psychiatric:     ?   Mood and Affect: Mood normal.     ?   Behavior: Behavior normal.  ? ? ? ? ?   ?Assessment & Plan:  ?1. Chronic seizure disorder: No seizure's. Continue current medication regimen with  Gabapentin. Neurology Following. 02/24/2022. ?2. Chronic muscle spasms, weakness with associated pain disorder: Continue current medication regimen with Baclofen. Continue with Exercise regime. 02/24/2022. ?3. Chronic dysphagia: No complaints today. Continue to monitor. GI Following. 02/24/2022. ?4. Anxiety with depression : No complaints today. Stable. Continue current medication regimen with  Elavil. 02/24/2022. ?5. Fibromyalgia/ Rib Pain/Chronic Pain: Continue Diclofenac: Continue with exercise and heat Therapy. 02/24/2022. ?Refilled:  oxyCODONE 7.5/325mg  one tablet 5 times  daily as needed  #140. Second script sent for the following month. Continue with slow weaning.   ?We will continue the opioid monitoring program, this consists of regular clinic visits, examinations, urine drug screen, pill counts as well as use of [place] Controlled Substance Reporting system. A 12 month History has been reviewed on the [place] Controlled Substance Reporting System 02/24/2022.  ?6. Peripheral Neuropathy: Continue current medication regimen with  Gabapentin: 02/24/2022. ?  ?F/U in 2 months ?  ? ?

## 2022-03-05 ENCOUNTER — Other Ambulatory Visit: Payer: Self-pay | Admitting: Nurse Practitioner

## 2022-03-05 DIAGNOSIS — D8989 Other specified disorders involving the immune mechanism, not elsewhere classified: Secondary | ICD-10-CM

## 2022-05-01 ENCOUNTER — Ambulatory Visit (INDEPENDENT_AMBULATORY_CARE_PROVIDER_SITE_OTHER): Payer: Medicare Other

## 2022-05-01 VITALS — Wt 200.0 lb

## 2022-05-01 DIAGNOSIS — Z0000 Encounter for general adult medical examination without abnormal findings: Secondary | ICD-10-CM

## 2022-05-01 NOTE — Patient Instructions (Signed)
Autumn Davis, Thank you for taking time to come for your Medicare Wellness Visit. I appreciate your ongoing commitment to your health goals. Please review the following plan we discussed and let me know if I can assist you in the future.   Screening recommendations/referrals: Colonoscopy: due at age 36 Mammogram: Done 05/07/2021 - Repeat annually *make appointment soon Bone Density: Done 07/24/2016 -repeat as directed Recommended yearly ophthalmology/optometry visit for glaucoma screening and checkup Recommended yearly dental visit for hygiene and checkup  Vaccinations: Influenza vaccine: Done 10/09/2021 - Repeat annually Pneumococcal vaccine: Done 08/29/2013 & 08/31/2014 Tdap vaccine: Done 02/23/2015 - Repeat in 10 years Shingles vaccine: Due at age 18  Covid-19: Declined - not advised  Advanced directives: Please bring a copy of your health care power of attorney and living will to the office to be added to your chart at your convenience.   Conditions/risks identified: Aim for 30 minutes of exercise or brisk walking, 6-8 glasses of water, and 5 servings of fruits and vegetables each day.   Next appointment: Follow up in one year for your annual wellness visit.   Preventive Care 3-23 Years Old, Female Preventive care refers to lifestyle choices and visits with your health care provider that can promote health and wellness. Preventive care visits are also called wellness exams. What can I expect for my preventive care visit? Counseling During your preventive care visit, your health care provider may ask about your: Medical history, including: Past medical problems. Family medical history. Pregnancy history. Current health, including: Menstrual cycle. Method of birth control. Emotional well-being. Home life and relationship well-being. Sexual activity and sexual health. Lifestyle, including: Alcohol, nicotine or tobacco, and drug use. Access to firearms. Diet, exercise, and  sleep habits. Work and work Statistician. Sunscreen use. Safety issues such as seatbelt and bike helmet use. Physical exam Your health care provider may check your: Height and weight. These may be used to calculate your BMI (body mass index). BMI is a measurement that tells if you are at a healthy weight. Waist circumference. This measures the distance around your waistline. This measurement also tells if you are at a healthy weight and may help predict your risk of certain diseases, such as type 2 diabetes and high blood pressure. Heart rate and blood pressure. Body temperature. Skin for abnormal spots. What immunizations do I need? Vaccines are usually given at various ages, according to a schedule. Your health care provider will recommend vaccines for you based on your age, medical history, and lifestyle or other factors, such as travel or where you work. What tests do I need? Screening Your health care provider may recommend screening tests for certain conditions. This may include: Pelvic exam and Pap test. Lipid and cholesterol levels. Diabetes screening. This is done by checking your blood sugar (glucose) after you have not eaten for a while (fasting). Hepatitis B test. Hepatitis C test. HIV (human immunodeficiency virus) test. STI (sexually transmitted infection) testing, if you are at risk. BRCA-related cancer screening. This may be done if you have a family history of breast, ovarian, tubal, or peritoneal cancers. Talk with your health care provider about your test results, treatment options, and if necessary, the need for more tests. Follow these instructions at home: Eating and drinking  Eat a healthy diet that includes fresh fruits and vegetables, whole grains, lean protein, and low-fat dairy products. Take vitamin and mineral supplements as recommended by your health care provider. Do not drink alcohol if: Your health care provider tells  you not to drink. You are pregnant,  may be pregnant, or are planning to become pregnant. If you drink alcohol: Limit how much you have to 0-1 drink a day. Know how much alcohol is in your drink. In the [place] one drink equals one 12 oz bottle of beer (355 mL), one 5 oz glass of wine (148 mL), or one 1 oz glass of hard liquor (44 mL). Lifestyle Brush your teeth every morning and night with fluoride toothpaste. Floss one time each day. Exercise for at least 30 minutes 5 or more days each week. Do not use any products that contain nicotine or tobacco. These products include cigarettes, chewing tobacco, and vaping devices, such as e-cigarettes. If you need help quitting, ask your health care provider. Do not use drugs. If you are sexually active, practice safe sex. Use a condom or other form of protection to prevent STIs. If you do not wish to become pregnant, use a form of birth control. If you plan to become pregnant, see your health care provider for a prepregnancy visit. Find healthy ways to manage stress, such as: Meditation, yoga, or listening to music. Journaling. Talking to a trusted person. Spending time with friends and family. Minimize exposure to UV radiation to reduce your risk of skin cancer. Safety Always wear your seat belt while driving or riding in a vehicle. Do not drive: If you have been drinking alcohol. Do not ride with someone who has been drinking. If you have been using any mind-altering substances or drugs. While texting. When you are tired or distracted. Wear a helmet and other protective equipment during sports activities. If you have firearms in your house, make sure you follow all gun safety procedures. Seek help if you have been physically or sexually abused. What's next? Go to your health care provider once a year for an annual wellness visit. Ask your health care provider how often you should have your eyes and teeth checked. Stay up to date on all vaccines. This information is not intended  to replace advice given to you by your health care provider. Make sure you discuss any questions you have with your health care provider. Document Revised: 05/01/2021 Document Reviewed: 05/01/2021 Elsevier Patient Education  [place].

## 2022-05-01 NOTE — Progress Notes (Signed)
Subjective:   Autumn Davis is a 36 y.o. female who presents for Medicare Annual (Subsequent) preventive examination.  Virtual Visit via Telephone Note  I connected with  Autumn Davis on 05/01/22 at  9:45 AM EDT by telephone and verified that I am speaking with the correct person using two identifiers.  Location: Patient: Home Provider: [facility] Persons participating in the virtual visit: patient/Nurse Health Advisor   I discussed the limitations, risks, security and privacy concerns of performing an evaluation and management service by telephone and the availability of in person appointments. The patient expressed understanding and agreed to proceed.  Interactive audio and video telecommunications were attempted between this nurse and patient, however failed, due to patient having technical difficulties OR patient did not have access to video capability.  We continued and completed visit with audio only.  Some vital signs may be absent or patient reported.   Autumn Burnley E Jamielyn Petrucci, LPN   Review of Systems     Cardiac Risk Factors include: advanced age (>21men, >51 women);dyslipidemia;sedentary lifestyle;obesity (BMI >30kg/m2);Other (see comment), Risk factor comments: Lupus, autoimmune disorder     Objective:    Today's Vitals   05/01/22 0948  Weight: 200 lb (90.7 kg)  PainSc: 5    Body mass index is 30.41 kg/m.     05/01/2022   10:02 AM 04/30/2021   10:25 AM 09/11/2017    3:02 PM 07/14/2017    1:03 PM 04/16/2017   11:37 AM 03/12/2017    1:46 PM 01/09/2017    2:53 PM  Advanced Directives  Does Patient Have a Medical Advance Directive? No No No No No No No  Would patient like information on creating a medical advance directive? No - Patient declined No - Patient declined  No - Patient declined  No - Patient declined     Current Medications (verified) Outpatient Encounter Medications as of 05/01/2022  Medication Sig   acetaZOLAMIDE (DIAMOX) 250 MG tablet TAKE 1  TABLET EVERY MORNING, TAKE 1 TABLET AT 2PM, AND TAKE 2 TABLETSAT BEDTIME   amitriptyline (ELAVIL) 75 MG tablet Take 1 tablet (75 mg total) by mouth at bedtime.   atorvastatin (LIPITOR) 40 MG tablet TAKE ONE (1) TABLET EACH DAY   baclofen (LIORESAL) 20 MG tablet TAKE ONE TABLET FOUR TIMES DAILY   Calcium-Magnesium-Vitamin D (CALCIUM 1200+D3 PO) Take 1 tablet by mouth daily.   cetirizine (ZYRTEC) 10 MG tablet Take 1 tablet (10 mg total) by mouth 2 (two) times daily as needed for allergies (Can take an extra dose during flare ups.).   cyanocobalamin (,VITAMIN B-12,) 1000 MCG/ML injection INJECT IM EVERY 30 DAYS   cycloSPORINE (RESTASIS) 0.05 % ophthalmic emulsion Place 1 drop into both eyes 2 (two) times daily.   diclofenac (VOLTAREN) 75 MG EC tablet TAKE ONE TABLET BY MOUTH TWICE DAILY   famotidine (PEPCID) 40 MG tablet Take 1 tablet (40 mg total) by mouth at bedtime.   furosemide (LASIX) 20 MG tablet Take 1 tablet (20 mg total) by mouth daily.   gabapentin (NEURONTIN) 600 MG tablet TAKE 1 TABLET 5 TIMES DAILY   ipratropium-albuterol (DUONEB) 0.5-2.5 (3) MG/3ML SOLN Take 3 mLs by nebulization every 4 (four) hours as needed.   L-Methylfolate 15 MG TABS Take 1 tablet (15 mg total) by mouth daily.   MAGNESIUM GLYCINATE PO Take 2 tablets by mouth daily.   metoprolol succinate (TOPROL-XL) 25 MG 24 hr tablet Take 1 tablet (25 mg total) by mouth daily.   mometasone-formoterol (  DULERA) 200-5 MCG/ACT AERO Inhale 2 puffs into the lungs in the morning and at bedtime.   montelukast (SINGULAIR) 10 MG tablet TAKE ONE TABLET BY MOUTH AT BEDTIME   Multiple Vitamins-Minerals (ZINC PO) Take 1 tablet by mouth daily.   Omega 3 1000 MG CAPS Take 1 capsule by mouth daily.   omeprazole (PRILOSEC) 40 MG capsule Take 1 capsule (40 mg total) by mouth 2 (two) times daily.   oxyCODONE-acetaminophen (PERCOCET) 7.5-325 MG tablet Take 1 tablet by mouth 5 (five) times daily as needed for moderate pain. No More Than 5 a day.    predniSONE (DELTASONE) 5 MG tablet Take 1 tablet (5 mg total) by mouth every morning.   Probiotic Product (PROBIOTIC-10 PO) Take by mouth.   tamsulosin (FLOMAX) 0.4 MG CAPS capsule TAKE ONE (1) CAPSULE EACH DAY   VENTOLIN HFA 108 (90 Base) MCG/ACT inhaler USE 2 PUFFS EVERY 4 TO 6 HOURS AS NEEDED   Vitamin D, Ergocalciferol, (DRISDOL) 1.25 MG (50000 UNIT) CAPS capsule TAKE 1 CAPSULE BY MOUTH EVERY 7 DAYS   vitamin E 180 MG (400 UNITS) capsule Take 1,000 Units by mouth daily.    Ascorbic Acid (VITAMIN C ADULT GUMMIES PO) Take 2 each by mouth daily. (Patient not taking: Reported on 05/01/2022)   diclofenac Sodium (VOLTAREN) 1 % GEL Apply 4 g topically 4 (four) times daily. (Patient not taking: Reported on 05/01/2022)   docusate sodium (COLACE) 100 MG capsule Take 100 mg by mouth 2 (two) times daily. PRN (Patient not taking: Reported on 05/01/2022)   EPINEPHRINE 0.3 mg/0.3 mL IJ SOAJ injection USE AS DIRECTED   nystatin (MYCOSTATIN) 100000 UNIT/ML suspension Use as directed 5 mLs (500,000 Units total) in the mouth or throat 4 (four) times daily. (Patient not taking: Reported on 05/01/2022)   nystatin cream (MYCOSTATIN) Apply 1 application topically 2 (two) times daily. (Patient not taking: Reported on 05/01/2022)   nystatin-triamcinolone ointment (MYCOLOG) Apply 1 application topically 2 (two) times daily. (Patient not taking: Reported on 05/01/2022)   promethazine (PHENERGAN) 12.5 MG tablet Take 1 tablet (12.5 mg total) by mouth every 8 (eight) hours as needed for nausea or vomiting. (Patient not taking: Reported on 05/01/2022)   [DISCONTINUED] Cholecalciferol (VITAMIN D3) 50000 units CAPS Take 50,000 Units by mouth once a week.   Facility-Administered Encounter Medications as of 05/01/2022  Medication   betamethasone acetate-betamethasone sodium phosphate (CELESTONE) injection 3 mg    Allergies (verified) Ciprofloxacin, Compazine, and Prochlorperazine   History: Past Medical History:  Diagnosis  Date   Allergy    Arthritis    HANDS,HIPS,KNEES   Bronchitis, chronic/intermittent 01/22/2012   Depression 01/22/2012   GERD (gastroesophageal reflux disease)    Hyperlipidemia    IBS (irritable bowel syndrome) 01/22/2012   Lupus (HCC)    Neuromuscular disorder (HCC)    Osteoporosis    Seizures (HCC)    SOB (shortness of breath)    Thyroid disease    Past Surgical History:  Procedure Laterality Date   8 HOUR PH STUDY N/A 10/29/2015   Procedure: 24 HOUR PH STUDY;  Surgeon: Iva Boop, MD;  Location: [facility];  Service: Endoscopy;  Laterality: N/A;   COLONOSCOPY     ESOPHAGEAL MANOMETRY N/A 10/29/2015   Procedure: ESOPHAGEAL MANOMETRY (EM);  Surgeon: Iva Boop, MD;  Location: [facility];  Service: Endoscopy;  Laterality: N/A;   KNEE SURGERY  2002/2003   bil   RECTAL SURGERY  correction of prolapse   2010   Family History  Problem  Relation Age of Onset   Thyroid disease Mother    Colon polyps Father    Lung cancer Maternal Grandmother    Cancer Paternal Grandmother        colon/pancreatiec/lymphoma   Irritable bowel syndrome Brother    AAA (abdominal aortic aneurysm) Neg Hx    Social History   Socioeconomic History   Marital status: Legally Separated    Spouse name: Not on file   Number of children: 1   Years of education: Not on file   Highest education level: Not on file  Occupational History   Occupation: disabled    Employer: [employer]  Tobacco Use   Smoking status: Never   Smokeless tobacco: Never  Vaping Use   Vaping Use: Never used  Substance and Sexual Activity   Alcohol use: No    Alcohol/week: 0.0 standard drinks of alcohol   Drug use: No   Sexual activity: Not on file  Other Topics Concern   Not on file  Social History Narrative   She lives alone with her 13 year old son - right now - they are living with her parents right now   Social Determinants of Health   Financial Resource Strain: Low Risk  (05/01/2022)   Overall  Financial Resource Strain (CARDIA)    Difficulty of Paying Living Expenses: Not hard at all  Food Insecurity: No Food Insecurity (05/01/2022)   Hunger Vital Sign    Worried About Running Out of Food in the Last Year: Never true    Ran Out of Food in the Last Year: Never true  Transportation Needs: No Transportation Needs (05/01/2022)   PRAPARE - Administrator, Civil Service (Medical): No    Lack of Transportation (Non-Medical): No  Physical Activity: Insufficiently Active (05/01/2022)   Exercise Vital Sign    Days of Exercise per Week: 7 days    Minutes of Exercise per Session: 20 min  Stress: No Stress Concern Present (05/01/2022)   [employer] of Occupational Health - Occupational Stress Questionnaire    Feeling of Stress : Only a little  Social Connections: Moderately Integrated (05/01/2022)   Social Connection and Isolation Panel [NHANES]    Frequency of Communication with Friends and Family: More than three times a week    Frequency of Social Gatherings with Friends and Family: More than three times a week    Attends Religious Services: More than 4 times per year    Active Member of [employer] or Organizations: Yes    Attends Engineer, structural: More than 4 times per year    Marital Status: Separated    Tobacco Counseling Counseling given: Not Answered   Clinical Intake:  Pre-visit preparation completed: Yes  Pain : 0-10 Pain Score: 5  Pain Type: Chronic pain Pain Location: Rib cage Pain Orientation: Right, Left Pain Radiating Towards: back Pain Descriptors / Indicators: Aching, Sharp, Sore, Discomfort Pain Onset: More than a month ago Pain Frequency: Intermittent     BMI - recorded: 30.41 Nutritional Status: BMI > 30  Obese Nutritional Risks: Nausea/ vomitting/ diarrhea (chronic - intermittent - once or twice per month) Diabetes: No  How often do you need to have someone help you when you read instructions, pamphlets, or other written  materials from your doctor or pharmacy?: 1 - Never  Diabetic? no  Interpreter Needed?: No  Information entered by :: Tyashia Morrisette, LPN   Activities of Daily Living    05/01/2022   10:02 AM  In your present state of health, do you have any difficulty performing the following activities:  Hearing? 0  Vision? 0  Difficulty concentrating or making decisions? 0  Walking or climbing stairs? 1  Dressing or bathing? 0  Doing errands, shopping? 0  Preparing Food and eating ? N  Using the Toilet? N  In the past six months, have you accidently leaked urine? Y  Comment intermittent - wears pad for protection  Do you have problems with loss of bowel control? N  Managing your Medications? N  Managing your Finances? N  Housekeeping or managing your Housekeeping? N    Patient Care Team: Bennie PieriniMartin, Mary-Margaret, FNP as PCP - General (Nurse Practitioner) Charna ElizabethMann, Jyothi, MD as Attending Physician (Gastroenterology) Antonietta Breachhandler, Mark C, MD as Referring Physician (Neurology) Coralyn HellingSood, Vineet, MD as Consulting Physician (Pulmonary Disease) Candice CampLowe, David, MD as Consulting Physician (Obstetrics and Gynecology)  Indicate any recent Medical Services you may have received from other than [facility] providers in the past year (date may be approximate).     Assessment:   This is a routine wellness examination for Lyonsasey.  Hearing/Vision screen Hearing Screening - Comments:: Denies hearing difficulties   Vision Screening - Comments:: Wears rx glasses - up to date with routine eye exams with [facility]  Dietary issues and exercise activities discussed: Current Exercise Habits: Home exercise routine, Type of exercise: walking;stretching, Time (Minutes): 20, Frequency (Times/Week): 7, Weekly Exercise (Minutes/Week): 140, Intensity: Mild, Exercise limited by: neurologic condition(s);orthopedic condition(s);psychological condition(s)   Goals Addressed             This Visit's Progress    Patient Stated    On track    Would like to start exercising again, lose weight and feel better       Depression Screen    05/01/2022   10:00 AM 02/24/2022    8:32 AM 12/10/2021   11:32 AM 11/25/2021    1:58 PM 09/30/2021    1:08 PM 06/18/2021    2:36 PM 06/05/2021   12:35 PM  PHQ 2/9 Scores  PHQ - 2 Score 2 0 0 1 0 0 0  PHQ- 9 Score 5   4  2      Fall Risk    05/01/2022    9:50 AM 02/24/2022    8:32 AM 12/10/2021   11:32 AM 11/25/2021    1:58 PM 09/30/2021    1:08 PM  Fall Risk   Falls in the past year? 1 1 0 1 0  Comment  Last fall at home March 2023. No major injury.     Number falls in past yr: 1 1 0 1 0  Injury with Fall? 0 0 0 0 0  Risk for fall due to : History of fall(s);Impaired balance/gait;Orthopedic patient;Medication side effect   History of fall(s)   Follow up Education provided;Falls prevention discussed   Education provided     FALL RISK PREVENTION PERTAINING TO THE HOME:  Any stairs in or around the home? No  If so, are there any without handrails? No  Home free of loose throw rugs in walkways, pet beds, electrical cords, etc? Yes  Adequate lighting in your home to reduce risk of falls? Yes   ASSISTIVE DEVICES UTILIZED TO PREVENT FALLS:  Life alert? No  Use of a cane, walker or w/c? No  Grab bars in the bathroom? Yes  Shower chair or bench in shower? Yes  Elevated toilet seat or a handicapped toilet? Yes   TIMED UP  AND GO:  Was the test performed? No . Telephonic visit  Cognitive Function:        05/01/2022   10:05 AM  6CIT Screen  What Year? 0 points  What month? 0 points  What time? 0 points  Count back from 20 0 points  Months in reverse 0 points  Repeat phrase 4 points  Total Score 4 points    Immunizations Immunization History  Administered Date(s) Administered   Influenza Split 08/27/2012   Influenza,inj,Quad PF,6+ Mos 08/29/2013, 08/31/2014, 09/27/2015, 09/12/2016, 09/23/2017, 09/21/2018, 09/26/2019, 11/26/2020, 10/09/2021   Pneumococcal  Conjugate-13 08/31/2014   Pneumococcal Polysaccharide-23 08/29/2013   Tdap 02/23/2015    TDAP status: Up to date  Flu Vaccine status: Up to date  Pneumococcal vaccine status: Up to date  Covid-19 vaccine status: Declined, Education has been provided regarding the importance of this vaccine but patient still declined. Advised may receive this vaccine at local pharmacy or Health Dept.or vaccine clinic. Aware to provide a copy of the vaccination record if obtained from local pharmacy or Health Dept. Verbalized acceptance and understanding.  Qualifies for Shingles Vaccine? No   Zostavax completed No    Screening Tests Health Maintenance  Topic Date Due   COVID-19 Vaccine (1) Never done   Hepatitis C Screening  11/25/2022 (Originally 01/13/2002)   MAMMOGRAM  05/07/2022   INFLUENZA VACCINE  06/17/2022   PAP SMEAR-Modifier  09/26/2022   TETANUS/TDAP  02/22/2025   HIV Screening  Completed   HPV VACCINES  Aged Out    Health Maintenance  Health Maintenance Due  Topic Date Due   COVID-19 Vaccine (1) Never done    COLONOSCOPY due at age 58  Mammogram status: Completed 05/07/2021. Repeat every year  Bone Density status: Completed 07/24/2016. Results reflect: Bone density results: OSTEOPOROSIS. Repeat every 2-5? years. Unsure due to age - advised to ask GYN  Lung Cancer Screening: (Low Dose CT Chest recommended if Age 35-80 years, 30 pack-year currently smoking OR have quit w/in 15years.) does not qualify.   Additional Screening:  Hepatitis C Screening: does not qualify  Vision Screening: Recommended annual ophthalmology exams for early detection of glaucoma and other disorders of the eye. Is the patient up to date with their annual eye exam?  Yes  Who is the provider or what is the name of the office in which the patient attends annual eye exams? [facility] If pt is not established with a provider, would they like to be referred to a provider to establish care? No .    Dental Screening: Recommended annual dental exams for proper oral hygiene  Community Resource Referral / Chronic Care Management: CRR required this visit?  No   CCM required this visit?  No      Plan:     I have personally reviewed and noted the following in the patient's chart:   Medical and social history Use of alcohol, tobacco or illicit drugs  Current medications and supplements including opioid prescriptions.  Functional ability and status Nutritional status Physical activity Advanced directives List of other physicians Hospitalizations, surgeries, and ER visits in previous 12 months Vitals Screenings to include cognitive, depression, and falls Referrals and appointments  In addition, I have reviewed and discussed with patient certain preventive protocols, quality metrics, and best practice recommendations. A written personalized care plan for preventive services as well as general preventive health recommendations were provided to patient.     [place] Constable, LPN   2/83/1517   Nurse Notes:  None

## 2022-05-05 ENCOUNTER — Other Ambulatory Visit: Payer: Self-pay | Admitting: Nurse Practitioner

## 2022-05-09 ENCOUNTER — Encounter: Payer: Self-pay | Admitting: Registered Nurse

## 2022-05-09 ENCOUNTER — Encounter: Payer: Medicare Other | Attending: Registered Nurse | Admitting: Registered Nurse

## 2022-05-09 VITALS — BP 123/86 | HR 83 | Ht 68.0 in | Wt 199.6 lb

## 2022-05-09 DIAGNOSIS — M546 Pain in thoracic spine: Secondary | ICD-10-CM | POA: Insufficient documentation

## 2022-05-09 DIAGNOSIS — G609 Hereditary and idiopathic neuropathy, unspecified: Secondary | ICD-10-CM | POA: Insufficient documentation

## 2022-05-09 DIAGNOSIS — G894 Chronic pain syndrome: Secondary | ICD-10-CM | POA: Insufficient documentation

## 2022-05-09 DIAGNOSIS — M7062 Trochanteric bursitis, left hip: Secondary | ICD-10-CM

## 2022-05-09 DIAGNOSIS — R569 Unspecified convulsions: Secondary | ICD-10-CM | POA: Diagnosis not present

## 2022-05-09 DIAGNOSIS — G8929 Other chronic pain: Secondary | ICD-10-CM | POA: Diagnosis not present

## 2022-05-09 DIAGNOSIS — M62838 Other muscle spasm: Secondary | ICD-10-CM | POA: Insufficient documentation

## 2022-05-09 DIAGNOSIS — Z79891 Long term (current) use of opiate analgesic: Secondary | ICD-10-CM | POA: Insufficient documentation

## 2022-05-09 DIAGNOSIS — M797 Fibromyalgia: Secondary | ICD-10-CM | POA: Insufficient documentation

## 2022-05-09 DIAGNOSIS — Z5181 Encounter for therapeutic drug level monitoring: Secondary | ICD-10-CM | POA: Diagnosis not present

## 2022-05-09 DIAGNOSIS — M7061 Trochanteric bursitis, right hip: Secondary | ICD-10-CM

## 2022-05-09 MED ORDER — OXYCODONE-ACETAMINOPHEN 7.5-325 MG PO TABS: 1.0000 | ORAL_TABLET | Freq: Every day | ORAL | 0 refills | Status: DC | PRN

## 2022-05-22 ENCOUNTER — Encounter: Payer: Self-pay | Admitting: Nurse Practitioner

## 2022-05-22 ENCOUNTER — Ambulatory Visit (INDEPENDENT_AMBULATORY_CARE_PROVIDER_SITE_OTHER): Payer: Medicare Other | Admitting: Nurse Practitioner

## 2022-05-22 VITALS — BP 118/79 | HR 84 | Temp 97.7°F | Resp 20 | Ht 68.0 in | Wt 203.0 lb

## 2022-05-22 DIAGNOSIS — M818 Other osteoporosis without current pathological fracture: Secondary | ICD-10-CM | POA: Diagnosis not present

## 2022-05-22 DIAGNOSIS — R1319 Other dysphagia: Secondary | ICD-10-CM | POA: Diagnosis not present

## 2022-05-22 DIAGNOSIS — I479 Paroxysmal tachycardia, unspecified: Secondary | ICD-10-CM | POA: Diagnosis not present

## 2022-05-22 DIAGNOSIS — R569 Unspecified convulsions: Secondary | ICD-10-CM

## 2022-05-22 DIAGNOSIS — E559 Vitamin D deficiency, unspecified: Secondary | ICD-10-CM | POA: Diagnosis not present

## 2022-05-22 DIAGNOSIS — F3342 Major depressive disorder, recurrent, in full remission: Secondary | ICD-10-CM

## 2022-05-22 DIAGNOSIS — E782 Mixed hyperlipidemia: Secondary | ICD-10-CM

## 2022-05-22 DIAGNOSIS — R600 Localized edema: Secondary | ICD-10-CM

## 2022-05-22 DIAGNOSIS — M797 Fibromyalgia: Secondary | ICD-10-CM | POA: Diagnosis not present

## 2022-05-22 DIAGNOSIS — M329 Systemic lupus erythematosus, unspecified: Secondary | ICD-10-CM

## 2022-05-22 DIAGNOSIS — E079 Disorder of thyroid, unspecified: Secondary | ICD-10-CM | POA: Diagnosis not present

## 2022-05-22 DIAGNOSIS — D8989 Other specified disorders involving the immune mechanism, not elsewhere classified: Secondary | ICD-10-CM

## 2022-05-22 DIAGNOSIS — K581 Irritable bowel syndrome with constipation: Secondary | ICD-10-CM | POA: Diagnosis not present

## 2022-05-22 DIAGNOSIS — K219 Gastro-esophageal reflux disease without esophagitis: Secondary | ICD-10-CM

## 2022-05-22 DIAGNOSIS — R609 Edema, unspecified: Secondary | ICD-10-CM

## 2022-05-22 MED ORDER — OMEPRAZOLE 40 MG PO CPDR: 40.0000 mg | DELAYED_RELEASE_CAPSULE | Freq: Two times a day (BID) | ORAL | 5 refills | Status: DC

## 2022-05-22 MED ORDER — AMITRIPTYLINE HCL 75 MG PO TABS: 75.0000 mg | ORAL_TABLET | Freq: Every day | ORAL | 1 refills | Status: DC

## 2022-05-22 MED ORDER — BACLOFEN 20 MG PO TABS: ORAL_TABLET | ORAL | 1 refills | Status: DC

## 2022-05-22 MED ORDER — GABAPENTIN 600 MG PO TABS: ORAL_TABLET | ORAL | 1 refills | Status: DC

## 2022-05-22 MED ORDER — FUROSEMIDE 20 MG PO TABS: 20.0000 mg | ORAL_TABLET | Freq: Every day | ORAL | 1 refills | Status: DC

## 2022-05-22 MED ORDER — ACETAZOLAMIDE 250 MG PO TABS: ORAL_TABLET | ORAL | 1 refills | Status: DC

## 2022-05-22 MED ORDER — PREDNISONE 5 MG PO TABS: 5.0000 mg | ORAL_TABLET | Freq: Every morning | ORAL | 1 refills | Status: DC

## 2022-05-22 MED ORDER — METOPROLOL SUCCINATE ER 25 MG PO TB24: 25.0000 mg | ORAL_TABLET | Freq: Every day | ORAL | 1 refills | Status: DC

## 2022-05-22 MED ORDER — ATORVASTATIN CALCIUM 40 MG PO TABS: ORAL_TABLET | ORAL | 1 refills | Status: DC

## 2022-05-22 MED ORDER — FAMOTIDINE 40 MG PO TABS: 40.0000 mg | ORAL_TABLET | Freq: Every day | ORAL | 5 refills | Status: DC

## 2022-05-22 NOTE — Progress Notes (Signed)
Subjective:    Patient ID: Autumn Davis, female    DOB: [birth year], 36 y.o.   MRN: [id number]   Chief Complaint: medical management of chronic issues     HPI:  Autumn Davis is a 36 y.o. who identifies as a female who was assigned female at birth.   Social history: Lives with: mom dad and son Work history: disability   Comes in today for follow up of the following chronic medical issues:  1. Tachycardia, paroxysmal (HCC) Has occasional palpitations no tachycardia  2. Gastroesophageal reflux disease without esophagitis Is on pepcid and omeprazole daily and is doing well  3. Esophageal dysphagia No trouble swallowing  4. Irritable bowel syndrome with constipation Better. Comes and goes. Uses colace and magensium when needed  5. Thyroid disease No issues that aware of. Lab Results  Component Value Date   TSH 1.780 11/25/2021     6. Other osteoporosis without current pathological fracture Tries to stay active despite her pain. No dedicated exercise  though. Last dexascan was done on 07/24/16. T score was -2.7. she thinks she had one a couple of years ago- through GYN  7. Mixed hyperlipidemia Does not really watch diet Lab Results  Component Value Date   CHOL 214 (H) 11/25/2021   HDL 72 11/25/2021   LDLCALC 110 (H) 11/25/2021   TRIG 186 (H) 11/25/2021   CHOLHDL 3.0 11/25/2021     8. Recurrent major depressive disorder, in full remission (HCC) Is currently on no antidepressant  9. Vitamin D deficiency Is on daily vitamin d supplement Last vitamin D Lab Results  Component Value Date   VD25OH 39.0 11/25/2021     10. Seizures (HCC) Denies any recent seizure activity. Has been several years since she has ahd a seizure.  11. Fibromyalgia Goes to pain clinic for pain management  12. 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 Some tests are positive while others re negative. They do say she does have some autoimmune disorder. She see specialist every 6 months. No changes have been made to plan of care in several years.   New complaints: None today  Allergies  Allergen Reactions   Ciprofloxacin Other (See Comments)    Muscle weakness and numbness   Compazine Other (See Comments)    hallucinations   Prochlorperazine Other (See Comments) and Rash    Pt states it makes her feel like things are crawling on her   Outpatient Encounter Medications as of 05/22/2022  Medication Sig   acetaZOLAMIDE (DIAMOX) 250 MG tablet TAKE 1 TABLET EVERY MORNING, TAKE 1 TABLET AT 2PM, AND TAKE 2 TABLETSAT BEDTIME   amitriptyline (ELAVIL) 75 MG tablet Take 1 tablet (75 mg total) by mouth at bedtime.   Ascorbic Acid (VITAMIN C ADULT GUMMIES PO) Take 2 each by mouth daily. (Patient not taking: Reported on 05/01/2022)   atorvastatin (LIPITOR) 40 MG tablet TAKE ONE (1) TABLET EACH DAY   baclofen (LIORESAL) 20 MG tablet TAKE ONE TABLET FOUR TIMES DAILY   Calcium-Magnesium-Vitamin D (CALCIUM 1200+D3 PO) Take 1 tablet by mouth daily.   cetirizine (ZYRTEC) 10 MG tablet Take 1 tablet (10 mg total) by mouth 2 (two) times daily as needed for allergies (Can take an extra dose during flare ups.).   cyanocobalamin (,VITAMIN B-12,) 1000 MCG/ML injection INJECT IM EVERY 30 DAYS   cycloSPORINE (RESTASIS) 0.05 % ophthalmic emulsion  Place 1 drop into both eyes 2 (two) times daily.   diclofenac (VOLTAREN) 75 MG EC tablet TAKE ONE TABLET BY MOUTH TWICE DAILY   diclofenac Sodium (VOLTAREN) 1 % GEL Apply 4 g topically 4 (four) times daily. (Patient not taking: Reported on 05/01/2022)   docusate sodium (COLACE) 100 MG capsule Take 100 mg by mouth 2 (two) times daily. PRN (Patient not taking: Reported on 05/01/2022)   EPINEPHRINE 0.3 mg/0.3 mL IJ SOAJ injection USE AS DIRECTED   famotidine (PEPCID) 40 MG tablet Take 1 tablet (40 mg total)  by mouth at bedtime.   furosemide (LASIX) 20 MG tablet Take 1 tablet (20 mg total) by mouth daily.   gabapentin (NEURONTIN) 600 MG tablet TAKE 1 TABLET 5 TIMES DAILY   ipratropium-albuterol (DUONEB) 0.5-2.5 (3) MG/3ML SOLN Take 3 mLs by nebulization every 4 (four) hours as needed.   L-Methylfolate 15 MG TABS Take 1 tablet (15 mg total) by mouth daily.   MAGNESIUM GLYCINATE PO Take 2 tablets by mouth daily.   metoprolol succinate (TOPROL-XL) 25 MG 24 hr tablet Take 1 tablet (25 mg total) by mouth daily.   mometasone-formoterol (DULERA) 200-5 MCG/ACT AERO Inhale 2 puffs into the lungs in the morning and at bedtime.   montelukast (SINGULAIR) 10 MG tablet TAKE ONE TABLET BY MOUTH AT BEDTIME   Multiple Vitamins-Minerals (ZINC PO) Take 1 tablet by mouth daily.   nystatin (MYCOSTATIN) 100000 UNIT/ML suspension Use as directed 5 mLs (500,000 Units total) in the mouth or throat 4 (four) times daily. (Patient not taking: Reported on 05/01/2022)   nystatin cream (MYCOSTATIN) Apply 1 application topically 2 (two) times daily. (Patient not taking: Reported on 05/01/2022)   nystatin-triamcinolone ointment (MYCOLOG) Apply 1 application topically 2 (two) times daily. (Patient not taking: Reported on 05/01/2022)   Omega 3 1000 MG CAPS Take 1 capsule by mouth daily.   omeprazole (PRILOSEC) 40 MG capsule Take 1 capsule (40 mg total) by mouth 2 (two) times daily.   oxyCODONE-acetaminophen (PERCOCET) 7.5-325 MG tablet Take 1 tablet by mouth 5 (five) times daily as needed for moderate pain. No More Than 5 a day.   predniSONE (DELTASONE) 5 MG tablet Take 1 tablet (5 mg total) by mouth every morning.   Probiotic Product (PROBIOTIC-10 PO) Take by mouth.   promethazine (PHENERGAN) 12.5 MG tablet Take 1 tablet (12.5 mg total) by mouth every 8 (eight) hours as needed for nausea or vomiting. (Patient not taking: Reported on 05/01/2022)   tamsulosin (FLOMAX) 0.4 MG CAPS capsule TAKE ONE (1) CAPSULE EACH DAY   VENTOLIN HFA 108 (90  Base) MCG/ACT inhaler USE 2 PUFFS EVERY 4 TO 6 HOURS AS NEEDED   Vitamin D, Ergocalciferol, (DRISDOL) 1.25 MG (50000 UNIT) CAPS capsule TAKE 1 CAPSULE BY MOUTH EVERY 7 DAYS   vitamin E 180 MG (400 UNITS) capsule Take 1,000 Units by mouth daily.    Facility-Administered Encounter Medications as of 05/22/2022  Medication   betamethasone acetate-betamethasone sodium phosphate (CELESTONE) injection 3 mg    Past Surgical History:  Procedure Laterality Date   52 HOUR PH STUDY N/A 10/29/2015   Procedure: 24 HOUR PH STUDY;  Surgeon: Iva Boop, MD;  Location: [facility];  Service: Endoscopy;  Laterality: N/A;   COLONOSCOPY     ESOPHAGEAL MANOMETRY N/A 10/29/2015   Procedure: ESOPHAGEAL MANOMETRY (EM);  Surgeon: Iva Boop, MD;  Location: [facility];  Service: Endoscopy;  Laterality: N/A;   KNEE SURGERY  2002/2003   bil   RECTAL SURGERY  correction of prolapse   2010    Family History  Problem Relation Age of Onset   Thyroid disease Mother    Colon polyps Father    Lung cancer Maternal Grandmother    Cancer Paternal Grandmother        colon/pancreatiec/lymphoma   Irritable bowel syndrome Brother    AAA (abdominal aortic aneurysm) Neg Hx       Controlled substance contract: n/a     Review of Systems  Constitutional:  Negative for diaphoresis.  Eyes:  Negative for pain.  Respiratory:  Negative for shortness of breath.   Cardiovascular:  Negative for chest pain, palpitations and leg swelling.  Gastrointestinal:  Negative for abdominal pain.  Endocrine: Negative for polydipsia.  Skin:  Negative for rash.  Neurological:  Negative for dizziness, weakness and headaches.  Hematological:  Does not bruise/bleed easily.  All other systems reviewed and are negative.      Objective:   Physical Exam Vitals and nursing note reviewed.  Constitutional:      General: She is not in acute distress.    Appearance: Normal appearance. She is well-developed.  HENT:     Head:  Normocephalic.     Right Ear: Tympanic membrane normal.     Left Ear: Tympanic membrane normal.     Nose: Nose normal.     Mouth/Throat:     Mouth: Mucous membranes are moist.  Eyes:     Pupils: Pupils are equal, round, and reactive to light.  Neck:     Vascular: No carotid bruit or JVD.  Cardiovascular:     Rate and Rhythm: Normal rate and regular rhythm.     Heart sounds: Normal heart sounds.  Pulmonary:     Effort: Pulmonary effort is normal. No respiratory distress.     Breath sounds: Normal breath sounds. No wheezing or rales.  Chest:     Chest wall: No tenderness.  Abdominal:     General: Bowel sounds are normal. There is no distension or abdominal bruit.     Palpations: Abdomen is soft. There is no hepatomegaly, splenomegaly, mass or pulsatile mass.     Tenderness: There is no abdominal tenderness.  Musculoskeletal:        General: Normal range of motion.     Cervical back: Normal range of motion and neck supple.  Lymphadenopathy:     Cervical: No cervical adenopathy.  Skin:    General: Skin is warm and dry.  Neurological:     Mental Status: She is alert and oriented to person, place, and time.     Deep Tendon Reflexes: Reflexes are normal and symmetric.  Psychiatric:        Behavior: Behavior normal.        Thought Content: Thought content normal.        Judgment: Judgment normal.     BP 118/79   Pulse 84   Temp 97.7 F (36.5 C) (Temporal)   Resp 20   Ht 5\' 8"  (1.727 m)   Wt 203 lb (92.1 kg)   SpO2 98%   BMI 30.87 kg/m        Assessment & Plan:   Autumn Davis comes in today with chief complaint of Medical Management of Chronic Issues   Diagnosis and orders addressed:  1. Tachycardia, paroxysmal (HCC) Avoid caffeine - metoprolol succinate (TOPROL-XL) 25 MG 24 hr tablet; Take 1 tablet (25 mg total) by mouth daily.  Dispense: 90 tablet; Refill: 1  2. Gastroesophageal reflux disease  without esophagitis Avoid spicy foods Do not eat 2 hours  prior to bedtime - famotidine (PEPCID) 40 MG tablet; Take 1 tablet (40 mg total) by mouth at bedtime.  Dispense: 30 tablet; Refill: 5 - omeprazole (PRILOSEC) 40 MG capsule; Take 1 capsule (40 mg total) by mouth 2 (two) times daily.  Dispense: 60 capsule; Refill: 5  3. Esophageal dysphagia Chew food well  4. Irritable bowel syndrome with constipation - amitriptyline (ELAVIL) 75 MG tablet; Take 1 tablet (75 mg total) by mouth at bedtime.  Dispense: 90 tablet; Refill: 1 - baclofen (LIORESAL) 20 MG tablet; TAKE ONE TABLET FOUR TIMES DAILY  Dispense: 360 each; Refill: 1  5. Thyroid disease Labs pending  6. Other osteoporosis without current pathological fracture Weight bearing exercises  7. Mixed hyperlipidemia Low fat diet - CBC with Differential/Platelet - CMP14+EGFR - Lipid panel - atorvastatin (LIPITOR) 40 MG tablet; TAKE ONE (1) TABLET EACH DAY  Dispense: 90 tablet; Refill: 1  8. Recurrent major depressive disorder, in full remission (HCC) Stress management  9. Vitamin D deficiency Daily vitami d supplement Labs pending  10. Seizures (HCC) - acetaZOLAMIDE (DIAMOX) 250 MG tablet; TAKE 1 TABLET EVERY MORNING, TAKE 1 TABLET AT 2PM, AND TAKE 2 TABLETSAT BEDTIME  Dispense: 360 tablet; Refill: 1  11. Fibromyalgia - Vitamin B12 - VITAMIN D 25 Hydroxy (Vit-D Deficiency, Fractures)  12. 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  Dispense: 90 tablet; Refill: 1  14. Peripheral edema Elevate legs when sitting - furosemide (LASIX) 20 MG tablet; Take 1 tablet (20 mg total) by mouth daily.  Dispense: 90 tablet; Refill: 1   Labs pending Health Maintenance reviewed-  patient will schedule mammogram Diet and exercise encouraged  Follow up plan: 6 months   Mary-Margaret Daphine Deutscher, FNP

## 2022-05-23 LAB — CBC WITH DIFFERENTIAL/PLATELET
Basophils Absolute: 0.1 10*3/uL (ref 0.0–0.2)
Basos: 1 %
EOS (ABSOLUTE): 0.1 10*3/uL (ref 0.0–0.4)
Eos: 1 %
Hematocrit: 36.6 % (ref 34.0–46.6)
Hemoglobin: 11.4 g/dL (ref 11.1–15.9)
Immature Grans (Abs): 0 10*3/uL (ref 0.0–0.1)
Immature Granulocytes: 0 %
Lymphocytes Absolute: 1.6 10*3/uL (ref 0.7–3.1)
Lymphs: 18 %
MCH: 28.3 pg (ref 26.6–33.0)
MCHC: 31.1 g/dL — ABNORMAL LOW (ref 31.5–35.7)
MCV: 91 fL (ref 79–97)
Monocytes Absolute: 0.6 10*3/uL (ref 0.1–0.9)
Monocytes: 6 %
Neutrophils Absolute: 6.5 10*3/uL (ref 1.4–7.0)
Neutrophils: 74 %
Platelets: 307 10*3/uL (ref 150–450)
RBC: 4.03 x10E6/uL (ref 3.77–5.28)
RDW: 13 % (ref 11.7–15.4)
WBC: 8.9 10*3/uL (ref 3.4–10.8)

## 2022-05-23 LAB — CMP14+EGFR
ALT: 12 IU/L (ref 0–32)
AST: 12 IU/L (ref 0–40)
Albumin/Globulin Ratio: 2.3 — ABNORMAL HIGH (ref 1.2–2.2)
Albumin: 4.5 g/dL (ref 3.8–4.8)
Alkaline Phosphatase: 84 IU/L (ref 44–121)
BUN/Creatinine Ratio: 13 (ref 9–23)
BUN: 11 mg/dL (ref 6–20)
Bilirubin Total: 0.3 mg/dL (ref 0.0–1.2)
CO2: 20 mmol/L (ref 20–29)
Calcium: 9.2 mg/dL (ref 8.7–10.2)
Chloride: 109 mmol/L — ABNORMAL HIGH (ref 96–106)
Creatinine, Ser: 0.82 mg/dL (ref 0.57–1.00)
Globulin, Total: 2 g/dL (ref 1.5–4.5)
Glucose: 133 mg/dL — ABNORMAL HIGH (ref 70–99)
Potassium: 3.6 mmol/L (ref 3.5–5.2)
Sodium: 144 mmol/L (ref 134–144)
Total Protein: 6.5 g/dL (ref 6.0–8.5)
eGFR: 94 mL/min/{1.73_m2} (ref >59)

## 2022-05-23 LAB — VITAMIN D 25 HYDROXY (VIT D DEFICIENCY, FRACTURES): Vit D, 25-Hydroxy: 39.2 ng/mL (ref 30.0–100.0)

## 2022-05-23 LAB — LIPID PANEL
Chol/HDL Ratio: 3.2 ratio (ref 0.0–4.4)
Cholesterol, Total: 187 mg/dL (ref 100–199)
HDL: 58 mg/dL (ref >39)
LDL Chol Calc (NIH): 96 mg/dL (ref 0–99)
Triglycerides: 194 mg/dL — ABNORMAL HIGH (ref 0–149)
VLDL Cholesterol Cal: 33 mg/dL (ref 5–40)

## 2022-05-23 LAB — VITAMIN B12: Vitamin B-12: 564 pg/mL (ref 232–1245)

## 2022-05-26 ENCOUNTER — Ambulatory Visit: Payer: Medicare Other | Admitting: Nurse Practitioner

## 2022-06-10 ENCOUNTER — Ambulatory Visit: Payer: Medicare Other | Admitting: Allergy and Immunology

## 2022-06-12 DIAGNOSIS — H04123 Dry eye syndrome of bilateral lacrimal glands: Secondary | ICD-10-CM | POA: Diagnosis not present

## 2022-06-12 DIAGNOSIS — H40033 Anatomical narrow angle, bilateral: Secondary | ICD-10-CM | POA: Diagnosis not present

## 2022-07-04 ENCOUNTER — Other Ambulatory Visit: Payer: Self-pay | Admitting: Nurse Practitioner

## 2022-07-09 ENCOUNTER — Encounter: Payer: Medicare Other | Admitting: Registered Nurse

## 2022-07-15 ENCOUNTER — Encounter: Payer: Medicare Other | Admitting: Registered Nurse

## 2022-07-21 ENCOUNTER — Other Ambulatory Visit: Payer: Self-pay | Admitting: Nurse Practitioner

## 2022-07-21 DIAGNOSIS — R350 Frequency of micturition: Secondary | ICD-10-CM

## 2022-07-28 ENCOUNTER — Encounter: Payer: Medicare Other | Attending: Registered Nurse | Admitting: Registered Nurse

## 2022-07-28 ENCOUNTER — Ambulatory Visit
Admission: RE | Admit: 2022-07-28 | Discharge: 2022-07-28 | Disposition: A | Discharge status: Home / Self Care | Payer: Medicare Other | Source: Ambulatory Visit | Attending: Registered Nurse | Admitting: Registered Nurse

## 2022-07-28 ENCOUNTER — Encounter: Payer: Self-pay | Admitting: Registered Nurse

## 2022-07-28 VITALS — BP 114/81 | HR 77 | Ht 68.0 in | Wt 210.2 lb

## 2022-07-28 DIAGNOSIS — G894 Chronic pain syndrome: Secondary | ICD-10-CM

## 2022-07-28 DIAGNOSIS — R569 Unspecified convulsions: Secondary | ICD-10-CM

## 2022-07-28 DIAGNOSIS — M797 Fibromyalgia: Secondary | ICD-10-CM

## 2022-07-28 DIAGNOSIS — M546 Pain in thoracic spine: Secondary | ICD-10-CM | POA: Diagnosis not present

## 2022-07-28 DIAGNOSIS — Z79891 Long term (current) use of opiate analgesic: Secondary | ICD-10-CM

## 2022-07-28 DIAGNOSIS — M545 Low back pain, unspecified: Secondary | ICD-10-CM | POA: Insufficient documentation

## 2022-07-28 DIAGNOSIS — M62838 Other muscle spasm: Secondary | ICD-10-CM | POA: Diagnosis not present

## 2022-07-28 DIAGNOSIS — M25552 Pain in left hip: Secondary | ICD-10-CM | POA: Diagnosis not present

## 2022-07-28 DIAGNOSIS — G8929 Other chronic pain: Secondary | ICD-10-CM

## 2022-07-28 DIAGNOSIS — M25551 Pain in right hip: Secondary | ICD-10-CM | POA: Diagnosis not present

## 2022-07-28 DIAGNOSIS — Z5181 Encounter for therapeutic drug level monitoring: Secondary | ICD-10-CM

## 2022-07-28 DIAGNOSIS — M4184 Other forms of scoliosis, thoracic region: Secondary | ICD-10-CM | POA: Diagnosis not present

## 2022-07-28 MED ORDER — METHYLPREDNISOLONE 4 MG PO TBPK: ORAL_TABLET | ORAL | 0 refills | Status: DC

## 2022-07-28 MED ORDER — OXYCODONE-ACETAMINOPHEN 7.5-325 MG PO TABS: 1.0000 | ORAL_TABLET | Freq: Every day | ORAL | 0 refills | Status: DC | PRN

## 2022-07-28 NOTE — Patient Instructions (Signed)
X-rays ordered.  Prescription sent for Steroid Pak, don't take your maintenance steroid dose.   Call  or My- Chart office in two weeks with update.   [phone]

## 2022-07-28 NOTE — Progress Notes (Signed)
Subjective:    Patient ID: Autumn Davis, female    DOB: 12-20-1985, 36 y.o.   MRN: [id number]  HPI: Autumn Davis is a 36 y.o. female who returns for follow up appointment for chronic pain and medication refill. She  reports increase intensity of pain in her mid- lower back and bilateral hips, she states she's not sure if she did too much around the house or if she is having a Fibro flare. She was instructed to performed her house duties in moderation she verbalizes understanding. X-rays ordered, she verbalizes understanding. She also reports bilateral shoulder pain and generalized joint pain. She rates her pain 9. Her current exercise regime is walking and performing stretching exercises.  Autumn Davis states she had a fall over a month ago she was walking in her home and tripped over the rug and fell forward. She was able to pick herself up, she was educated on fall prevention. She verbalizes understanding.   Autumn Davis Morphine equivalent is 37.35 MME.   Oral Swab was Performed today.      Pain Inventory Average Pain 6 Pain Right Now 9 My pain is constant, sharp, burning, stabbing, tingling, and aching  In the last 24 hours, has pain interfered with the following? General activity 10 Relation with others 10 Enjoyment of life 10 What TIME of day is your pain at its worst? evening and night Sleep (in general) Poor  Pain is worse with: walking, bending, sitting, inactivity, standing, and some activites Pain improves with: rest, heat/ice, therapy/exercise, pacing activities, medication, and TENS Relief from Meds: 5  Family History  Problem Relation Age of Onset   Thyroid disease Mother    Colon polyps Father    Lung cancer Maternal Grandmother    Cancer Paternal Grandmother        colon/pancreatiec/lymphoma   Irritable bowel syndrome Brother    AAA (abdominal aortic aneurysm) Neg Hx    Social History   Socioeconomic History   Marital status: Legally  Separated    Spouse name: Not on file   Number of children: 1   Years of education: Not on file   Highest education level: Not on file  Occupational History   Occupation: disabled    Employer: [employer]  Tobacco Use   Smoking status: Never   Smokeless tobacco: Never  Vaping Use   Vaping Use: Never used  Substance and Sexual Activity   Alcohol use: No    Alcohol/week: 0.0 standard drinks of alcohol   Drug use: No   Sexual activity: Not on file  Other Topics Concern   Not on file  Social History Narrative   She lives alone with her 37 year old son - right now - they are living with her parents right now   Social Determinants of Health   Financial Resource Strain: Low Risk  (05/01/2022)   Overall Financial Resource Strain (CARDIA)    Difficulty of Paying Living Expenses: Not hard at all  Food Insecurity: No Food Insecurity (05/01/2022)   Hunger Vital Sign    Worried About Running Out of Food in the Last Year: Never true    Ran Out of Food in the Last Year: Never true  Transportation Needs: No Transportation Needs (05/01/2022)   PRAPARE - Administrator, Civil Service (Medical): No    Lack of Transportation (Non-Medical): No  Physical Activity: Insufficiently Active (05/01/2022)   Exercise Vital Sign    Days of Exercise per  Week: 7 days    Minutes of Exercise per Session: 20 min  Stress: No Stress Concern Present (05/01/2022)   [employer] of Occupational Health - Occupational Stress Questionnaire    Feeling of Stress : Only a little  Social Connections: Moderately Integrated (05/01/2022)   Social Connection and Isolation Panel [NHANES]    Frequency of Communication with Friends and Family: More than three times a week    Frequency of Social Gatherings with Friends and Family: More than three times a week    Attends Religious Services: More than 4 times per year    Active Member of Clubs or Organizations: Yes    Attends Banker  Meetings: More than 4 times per year    Marital Status: Separated   Past Surgical History:  Procedure Laterality Date   24 HOUR PH STUDY N/A 10/29/2015   Procedure: 24 HOUR PH STUDY;  Surgeon: Iva Boop, MD;  Location: [facility];  Service: Endoscopy;  Laterality: N/A;   COLONOSCOPY     ESOPHAGEAL MANOMETRY N/A 10/29/2015   Procedure: ESOPHAGEAL MANOMETRY (EM);  Surgeon: Iva Boop, MD;  Location: [facility];  Service: Endoscopy;  Laterality: N/A;   KNEE SURGERY  2002/2003   bil   RECTAL SURGERY  correction of prolapse   2010   Past Surgical History:  Procedure Laterality Date   30 HOUR PH STUDY N/A 10/29/2015   Procedure: 24 HOUR PH STUDY;  Surgeon: Iva Boop, MD;  Location: [facility];  Service: Endoscopy;  Laterality: N/A;   COLONOSCOPY     ESOPHAGEAL MANOMETRY N/A 10/29/2015   Procedure: ESOPHAGEAL MANOMETRY (EM);  Surgeon: Iva Boop, MD;  Location: [facility];  Service: Endoscopy;  Laterality: N/A;   KNEE SURGERY  2002/2003   bil   RECTAL SURGERY  correction of prolapse   2010   Past Medical History:  Diagnosis Date   Allergy    Arthritis    HANDS,HIPS,KNEES   Bronchitis, chronic/intermittent 01/22/2012   Depression 01/22/2012   GERD (gastroesophageal reflux disease)    Hyperlipidemia    IBS (irritable bowel syndrome) 01/22/2012   Lupus (HCC)    Neuromuscular disorder (HCC)    Osteoporosis    Seizures (HCC)    SOB (shortness of breath)    Thyroid disease    BP 114/81   Pulse 77   Ht 5\' 8"  (1.727 m)   Wt 210 lb 3.2 oz (95.3 kg)   SpO2 96%   BMI 31.96 kg/m   Opioid Risk Score:   Fall Risk Score:  `1  Depression screen [facility] 2/9     07/28/2022   11:31 AM 05/22/2022   12:20 PM 05/01/2022   10:00 AM 02/24/2022    8:32 AM 12/10/2021   11:32 AM 11/25/2021    1:58 PM 09/30/2021    1:08 PM  Depression screen PHQ 2/9  Decreased Interest 0 1 1 0 0 1 0  Down, Depressed, Hopeless 0 0 1 0 0 0 0  PHQ - 2 Score 0 1 2 0 0 1 0  Altered sleeping  1 1    1    Tired, decreased energy  1 1   1    Change in appetite  0 1   1   Feeling bad or failure about yourself   0 0   0   Trouble concentrating  0 0   0   Moving slowly or fidgety/restless  0 0   0   Suicidal thoughts  0  0   0   PHQ-9 Score  3 5   4    Difficult doing work/chores  Not difficult at all Somewhat difficult   Somewhat difficult     Review of Systems  Constitutional: Negative.   HENT: Negative.    Eyes: Negative.   Respiratory: Negative.    Cardiovascular: Negative.   Gastrointestinal: Negative.   Endocrine: Negative.   Genitourinary: Negative.   Musculoskeletal:  Positive for arthralgias, back pain, gait problem and myalgias.  Skin: Negative.   Allergic/Immunologic: Negative.   Hematological: Negative.   Psychiatric/Behavioral: Negative.    All other systems reviewed and are negative.      Objective:   Physical Exam Vitals and nursing note reviewed.  Constitutional:      Appearance: Normal appearance.  Cardiovascular:     Rate and Rhythm: Normal rate and regular rhythm.     Pulses: Normal pulses.     Heart sounds: Normal heart sounds.  Pulmonary:     Effort: Pulmonary effort is normal.     Breath sounds: Normal breath sounds.  Musculoskeletal:     Cervical back: Normal range of motion and neck supple.     Comments: Normal Muscle Bulk and Muscle Testing Reveals:  Upper Extremities: Decreased ROM  90 Degrees and Muscle Strength 5/5 Bilateral AC Joint Tenderness Thoracic Paraspinal Tenderness: T-7-T-9 Lumbar Paraspinal Tenderness: L-3-L-5 Bilateral Greater Trochanter Tenderness Lower Extremities: Full ROM and Muscle Strength 5/5 Arises from Table slowly  Antalgic  Gait     Skin:    General: Skin is warm and dry.  Neurological:     Mental Status: She is alert and oriented to person, place, and time.  Psychiatric:        Mood and Affect: Mood normal.        Behavior: Behavior normal.         Assessment & Plan:  Acute Exacerbation of chronic low  back pain: RX Lumbar Xray. RX: Medrol dose pax. Continue to Monitor Acute/ Chronic Thoracic Back Pain: RX: Thoracic X-ray. Continue to Monitor.  3. Chronic seizure disorder: No seizure's. Continue current medication regimen with  Gabapentin. Neurology Following. 07/28/2022.  Chronic muscle spasms, weakness with associated pain disorder: Continue current medication regimen with Baclofen. Continue with Exercise regime. 07/28/2022.  Chronic dysphagia: No complaints today. Continue to monitor. GI Following. 07/28/2022.  Anxiety with depression : No complaints today. Stable. Continue current medication regimen with  Elavil. 07/28/2022. 6. Fibromyalgia/ Rib Pain/Chronic Pain: Continue Diclofenac: Continue with exercise and heat Therapy. 07/28/2022. Refilled:  oxyCODONE 7.5/325mg  one tablet 5 times  daily as needed  #140. Second script sent for the following month. Continue with slow weaning.   We will continue the opioid monitoring program, this consists of regular clinic visits, examinations, urine drug screen, pill counts as well as use of [place] Controlled Substance Reporting system. A 12 month History has been reviewed on the [place] Controlled Substance Reporting System 07/28/2022.  7.Peripheral Neuropathy: Continue current medication regimen with  Gabapentin: 07/28/2022. 8..Fall at Home: Educated on Fall Prevention:: She [employer].     F/U in 2 months

## 2022-07-29 ENCOUNTER — Telehealth: Payer: Self-pay | Admitting: Registered Nurse

## 2022-07-29 NOTE — Telephone Encounter (Signed)
Call placed to [name],  X-ray results was reviewed.  She will call her urologist regarding the Bilateral Nephrolithiasis, she verbalizes understanding.

## 2022-07-31 ENCOUNTER — Other Ambulatory Visit: Payer: Self-pay | Admitting: Nurse Practitioner

## 2022-07-31 DIAGNOSIS — Z1231 Encounter for screening mammogram for malignant neoplasm of breast: Secondary | ICD-10-CM

## 2022-08-01 ENCOUNTER — Telehealth: Payer: Self-pay | Admitting: *Deleted

## 2022-08-01 LAB — DRUG TOX ALC METAB W/CON, ORAL FLD: Alcohol Metabolite: NEGATIVE ng/mL (ref <25)

## 2022-08-01 LAB — DRUG TOX MONITOR 1 W/CONF, ORAL FLD
Amphetamines: NEGATIVE ng/mL (ref <10)
Barbiturates: NEGATIVE ng/mL (ref <10)
Benzodiazepines: NEGATIVE ng/mL (ref <0.50)
Buprenorphine: NEGATIVE ng/mL (ref <0.10)
Cocaine: NEGATIVE ng/mL (ref <5.0)
Codeine: NEGATIVE ng/mL (ref <2.5)
Dihydrocodeine: NEGATIVE ng/mL (ref <2.5)
Fentanyl: NEGATIVE ng/mL (ref <0.10)
Heroin Metabolite: NEGATIVE ng/mL (ref <1.0)
Hydrocodone: NEGATIVE ng/mL (ref <2.5)
Hydromorphone: NEGATIVE ng/mL (ref <2.5)
MARIJUANA: NEGATIVE ng/mL (ref <2.5)
MDMA: NEGATIVE ng/mL (ref <10)
Meprobamate: NEGATIVE ng/mL (ref <2.5)
Methadone: NEGATIVE ng/mL (ref <5.0)
Morphine: NEGATIVE ng/mL (ref <2.5)
Nicotine Metabolite: NEGATIVE ng/mL (ref <5.0)
Norhydrocodone: NEGATIVE ng/mL (ref <2.5)
Noroxycodone: 19.7 ng/mL — ABNORMAL HIGH (ref <2.5)
Opiates: POSITIVE ng/mL — AB (ref <2.5)
Oxycodone: 167.1 ng/mL — ABNORMAL HIGH (ref <2.5)
Oxymorphone: NEGATIVE ng/mL (ref <2.5)
Phencyclidine: NEGATIVE ng/mL (ref <10)
Tapentadol: NEGATIVE ng/mL (ref <5.0)
Tramadol: NEGATIVE ng/mL (ref <5.0)
Zolpidem: NEGATIVE ng/mL (ref <5.0)

## 2022-08-01 NOTE — Telephone Encounter (Signed)
Oral swab drug screen was consistent for prescribed medications.  ?

## 2022-08-02 ENCOUNTER — Other Ambulatory Visit: Payer: Self-pay | Admitting: Nurse Practitioner

## 2022-08-18 ENCOUNTER — Ambulatory Visit: Payer: Medicare Other | Admitting: Urology

## 2022-08-18 ENCOUNTER — Encounter: Payer: Self-pay | Admitting: Urology

## 2022-08-18 VITALS — BP 117/81 | HR 80 | Ht 68.0 in | Wt 200.0 lb

## 2022-08-18 DIAGNOSIS — N200 Calculus of kidney: Secondary | ICD-10-CM

## 2022-08-18 DIAGNOSIS — R350 Frequency of micturition: Secondary | ICD-10-CM

## 2022-08-18 LAB — BLADDER SCAN AMB NON-IMAGING: Scan Result: 99

## 2022-08-18 MED ORDER — MIRABEGRON ER 25 MG PO TB24: 25.0000 mg | ORAL_TABLET | Freq: Every day | ORAL | 0 refills | Status: DC

## 2022-08-18 NOTE — Progress Notes (Signed)
Assessment: 1. Nephrolithiasis   2. Urinary frequency     Plan: I personally reviewed the KUB study from 07/29/2022 showing bilateral renal calcifications. Stone prevention discussed and information provided Schedule for CT renal stone protocol for further evaluation of nephrolithiasis Trial of Myrbetriq 25 mg daily.  Samples given. Continue tamsulosin Return to office in 1 month  Chief Complaint:  Chief Complaint  Patient presents with   Nephrolithiasis    History of Present Illness:  Autumn Davis is a 36 y.o. female who is seen in consultation from Chevis Pretty, [place] for evaluation of nephrolithiasis.  She has a history of nephrolithiasis and was previously followed by Dr. Exie Parody in [place].  She has passed stones previously.  Her last stone episode was approximately 1 year ago.  No recent flank pain.  No dysuria or gross hematuria.  She does have chronic back pain and takes pain medication daily. She was recently evaluated with plain films of the lumbar spine which showed bilateral renal calcifications with the largest measuring 7 mm in the right kidney.  She has a history of urinary symptoms including frequency, urgency, and sensation of incomplete emptying.  She has previously been managed with tamsulosin and Myrbetriq.  She has not been on Myrbetriq recently due to her insurance coverage of this medication.  She continues on tamsulosin daily.  She does report occasional incontinence.  She is not using pads on a regular basis.  No recent UTIs.   Past Medical History:  Past Medical History:  Diagnosis Date   Allergy    Arthritis    HANDS,HIPS,KNEES   Bronchitis, chronic/intermittent 01/22/2012   Depression 01/22/2012   GERD (gastroesophageal reflux disease)    Hyperlipidemia    IBS (irritable bowel syndrome) 01/22/2012   Lupus (HCC)    Neuromuscular disorder (HCC)    Osteoporosis    Seizures (HCC)    SOB (shortness of breath)    Thyroid disease     Past  Surgical History:  Past Surgical History:  Procedure Laterality Date   34 HOUR [place] STUDY N/A 10/29/2015   Procedure: [place] STUDY;  Surgeon: Gatha Mayer, MD;  Location: [facility];  Service: Endoscopy;  Laterality: N/A;   COLONOSCOPY     ESOPHAGEAL MANOMETRY N/A 10/29/2015   Procedure: ESOPHAGEAL MANOMETRY (EM);  Surgeon: Gatha Mayer, MD;  [facility]: [facility];  Service: Endoscopy;  Laterality: N/A;   KNEE SURGERY  2002/2003   bil   RECTAL SURGERY  correction of prolapse   2010    Allergies:  Allergies  Allergen Reactions   Ciprofloxacin Other (See Comments)    Muscle weakness and numbness Other reaction(s): Not available   Compazine Other (See Comments)    hallucinations   Prochlorperazine Other (See Comments) and Rash    Pt states it makes her feel like things are crawling on her Other reaction(s): Not available    Family History:  Family History  Problem Relation Age of Onset   Thyroid disease Mother    Colon polyps Father    Lung cancer Maternal Grandmother    Cancer Paternal Grandmother        colon/pancreatiec/lymphoma   Irritable bowel syndrome Brother    AAA (abdominal aortic aneurysm) Neg Hx     Social History:  Social History   Tobacco Use   Smoking status: Never   Smokeless tobacco: Never  Vaping Use   Vaping Use: Never used  Substance Use Topics   Alcohol use: No  Alcohol/week: 0.0 standard drinks of alcohol   Drug use: No    Review of symptoms:  Constitutional:  Negative for unexplained weight loss, night sweats, fever, chills ENT:  Negative for nose bleeds, sinus pain, painful swallowing CV:  Negative for chest pain, shortness of breath, exercise intolerance, palpitations, loss of consciousness Resp:  Negative for cough, wheezing, shortness of breath GI:  Negative for nausea, vomiting, bloody stools GU:  Positives noted in HPI; otherwise negative for gross hematuria, dysuria Neuro:  Negative for seizures, poor balance, limb  weakness, slurred speech Psych:  Negative for lack of energy, depression, anxiety Endocrine:  Negative for polyuria, symptoms of hypoglycemia (dizziness, hunger, sweating) Hematologic:  Negative for anemia, purpura, petechia, prolonged or excessive bleeding, use of anticoagulants  Allergic:  Negative for difficulty breathing or choking as a result of exposure to anything; no shellfish allergy; no allergic response (rash/itch) to materials, foods  Physical exam: BP 117/81   Pulse 80   Ht 5\' 8"  (1.727 m)   Wt 200 lb (90.7 kg)   BMI 30.41 kg/m  GENERAL APPEARANCE:  Well appearing, well developed, well nourished, NAD HEENT: Atraumatic, Normocephalic, oropharynx clear. NECK: Supple without lymphadenopathy or thyromegaly. LUNGS: Clear to auscultation bilaterally. HEART: Regular Rate and Rhythm without murmurs, gallops, or rubs. ABDOMEN: Soft, non-tender, No Masses. EXTREMITIES: Moves all extremities well.  Without clubbing, cyanosis, or edema. NEUROLOGIC:  Alert and oriented x 3, normal gait, CN II-XII grossly intact.  MENTAL STATUS:  Appropriate. BACK:  Non-tender to palpation.  No CVAT SKIN:  Warm, dry and intact.    Results: U/A: Dipstick negative  PVR: 99 ml

## 2022-08-18 NOTE — Progress Notes (Signed)
post void residual=99mL 

## 2022-08-19 LAB — URINALYSIS, ROUTINE W REFLEX MICROSCOPIC
Bilirubin, UA: NEGATIVE
Glucose, UA: NEGATIVE
Ketones, UA: NEGATIVE
Leukocytes,UA: NEGATIVE
Nitrite, UA: NEGATIVE
Protein,UA: NEGATIVE
RBC, UA: NEGATIVE
Specific Gravity, UA: 1.005 (ref 1.005–1.030)
Urobilinogen, Ur: 0.2 mg/dL (ref 0.2–1.0)
pH, UA: 5.5 (ref 5.0–7.5)

## 2022-08-22 ENCOUNTER — Telehealth: Payer: Self-pay

## 2022-08-22 ENCOUNTER — Telehealth: Payer: Self-pay | Admitting: Nurse Practitioner

## 2022-08-22 MED ORDER — METHYLPREDNISOLONE 4 MG PO TBPK: ORAL_TABLET | ORAL | 0 refills | Status: DC

## 2022-08-22 NOTE — Telephone Encounter (Signed)
Flare up of autoimmune disorder. Having a lot of pain. Pain managemet told her to contact ouro ffice for steroid pack.  Meds ordered this encounter  Medications   methylPREDNISolone (MEDROL DOSEPAK) 4 MG TBPK tablet    Sig: Use as directed    Dispense:  21 tablet    Refill:  0    Order Specific Question:   Supervising Provider    Answer:   Worthy Rancher [[id number]]   [place], FNP

## 2022-08-22 NOTE — Telephone Encounter (Signed)
Autumn Davis completed the Steroid dose pack. Per patient she was feeling good  and  back to her normal self. But last night she awoke with pain in both legs & ribs. Autumn Davis stated she is having another flare up.She wanted to know if  another dose pack is needed?   It's Friday afternoon Autumn Davis is out of the office. Patient advised to call her PCP and Autumn Ball NP will be informed on her return.   Call back phone [phone].

## 2022-08-26 NOTE — Telephone Encounter (Signed)
Return Ms. Holkcomb call,  She states she called her PCP, she was prescribed steroid pak. Also reports she is staring to feel better.  She will keep this provider updated.

## 2022-09-02 ENCOUNTER — Ambulatory Visit: Payer: Medicare Other | Admitting: Allergy and Immunology

## 2022-09-02 ENCOUNTER — Other Ambulatory Visit: Payer: Self-pay

## 2022-09-02 ENCOUNTER — Encounter: Payer: Self-pay | Admitting: Allergy and Immunology

## 2022-09-02 VITALS — BP 118/68 | HR 90 | Temp 98.5°F | Resp 20 | Ht 68.0 in | Wt 200.0 lb

## 2022-09-02 DIAGNOSIS — J4540 Moderate persistent asthma, uncomplicated: Secondary | ICD-10-CM

## 2022-09-02 DIAGNOSIS — Z6830 Body mass index (BMI) 30.0-30.9, adult: Secondary | ICD-10-CM

## 2022-09-02 DIAGNOSIS — E6609 Other obesity due to excess calories: Secondary | ICD-10-CM | POA: Diagnosis not present

## 2022-09-02 DIAGNOSIS — J383 Other diseases of vocal cords: Secondary | ICD-10-CM

## 2022-09-02 DIAGNOSIS — K219 Gastro-esophageal reflux disease without esophagitis: Secondary | ICD-10-CM

## 2022-09-02 MED ORDER — EPINEPHRINE 0.3 MG/0.3ML IJ SOAJ: 0.3000 mg | INTRAMUSCULAR | 2 refills | Status: DC | PRN

## 2022-09-02 MED ORDER — MONTELUKAST SODIUM 10 MG PO TABS: 10.0000 mg | ORAL_TABLET | Freq: Every day | ORAL | 1 refills | Status: DC

## 2022-09-02 MED ORDER — ALBUTEROL SULFATE HFA 108 (90 BASE) MCG/ACT IN AERS: 2.0000 | INHALATION_SPRAY | RESPIRATORY_TRACT | 3 refills | Status: DC | PRN

## 2022-09-02 MED ORDER — CETIRIZINE HCL 10 MG PO TABS: 10.0000 mg | ORAL_TABLET | Freq: Two times a day (BID) | ORAL | 5 refills | Status: DC | PRN

## 2022-09-02 MED ORDER — FAMOTIDINE 40 MG PO TABS: 40.0000 mg | ORAL_TABLET | Freq: Every day | ORAL | 5 refills | Status: DC

## 2022-09-02 MED ORDER — MOMETASONE FURO-FORMOTEROL FUM 200-5 MCG/ACT IN AERO: 2.0000 | INHALATION_SPRAY | Freq: Two times a day (BID) | RESPIRATORY_TRACT | 5 refills | Status: DC

## 2022-09-02 MED ORDER — OMEPRAZOLE 40 MG PO CPDR: 40.0000 mg | DELAYED_RELEASE_CAPSULE | Freq: Two times a day (BID) | ORAL | 5 refills | Status: DC

## 2022-09-02 NOTE — Progress Notes (Unsigned)
[place] - [place] - [place] - [name] - [name]   Follow-up Note  Referring Provider: [name], * Primary Provider: [name], FNP Date of Office Visit: 09/02/2022  Subjective:   Autumn Davis (DOB: 1986-01-09) is a 36 y.o. female who [facility] in re-evaluation of the following:  HPI: Bradi returns to this clinic in evaluation of asthma, LPR, VCD, history of laryngeal spasm, in the context of chronic systemic steroid use for connective tissue disease.  I last saw her in this clinic on 03 December 2021.  She has had an excellent interval of time without any significant issues involving her respiratory tract that is required her to use a systemic steroid or an antibiotic.  Rarely does she use a short acting bronchodilator while she continues on Dulera 1 time per day and montelukast 10 mg daily.  Yesterday she did develop some coughing and she had a episode of laryngeal spasm but fortunately that broke and she has not had any lingering issues with that issue except some slightly raspy voice.  She is done well with her upper airway as well.  Her reflux is controlled on omeprazole and famotidine although she still occasionally has some classic reflux symptoms.  She has searched out the specifics about a Nissen fundoplication and at this point time she is not that interested in moving forward with this form of treatment.  She has lost weight but she has now obtained a plateau with her weight loss program and cannot seem to lose any more weight.  Allergies as of 09/02/2022       Reactions   Ciprofloxacin Other (See Comments)   Muscle weakness and numbness Other reaction(s): Not available   Compazine Other (See Comments)   hallucinations   Prochlorperazine Other (See Comments), Rash   Pt states it makes her feel like things are crawling on her Other reaction(s): Not available        Medication List     acetaZOLAMIDE 250 MG tablet Commonly known as: DIAMOX TAKE 1 TABLET EVERY MORNING, TAKE 1 TABLET AT 2PM, AND TAKE 2 TABLETSAT BEDTIME   amitriptyline 75 MG tablet Commonly known as: ELAVIL Take 1 tablet (75 mg total) by mouth at bedtime.   atorvastatin 40 MG tablet Commonly known as: LIPITOR TAKE ONE (1) TABLET EACH DAY   baclofen 20 MG tablet Commonly known as: LIORESAL TAKE ONE TABLET FOUR TIMES DAILY   budesonide-formoterol 160-4.5 MCG/ACT inhaler Commonly known as: SYMBICORT   CALCIUM 1200+D3 PO Take 1 tablet by mouth daily.   cetirizine 10 MG tablet Commonly known as: ZYRTEC Take 1 tablet (10 mg total) by mouth 2 (two) times daily as needed for allergies (Can take an extra dose during flare ups.).   cromolyn 4 % ophthalmic solution Commonly known as: OPTICROM SMARTSIG:In Eye(s)   cyanocobalamin 1000 MCG/ML injection Commonly known as: VITAMIN B12 INJECT IM EVERY 30 DAYS   cycloSPORINE 0.05 % ophthalmic emulsion Commonly known as: RESTASIS Place 1 drop into both eyes 2 (two) times daily.   diclofenac 75 MG EC tablet Commonly known as: VOLTAREN TAKE ONE TABLET BY MOUTH TWICE DAILY   diclofenac Sodium 1 % Gel Commonly known as: Voltaren Apply 4 g topically 4 (four) times daily.   docusate sodium 100 MG capsule Commonly known as: COLACE Take 100 mg by mouth 2 (two) times daily. PRN   EPINEPHrine 0.3 mg/0.3 mL Soaj injection Commonly known as: EPI-PEN USE AS  DIRECTED   famotidine 40 MG tablet Commonly known as: PEPCID Take 1 tablet (40 mg total) by mouth at bedtime.   furosemide 20 MG tablet Commonly known as: LASIX Take 1 tablet (20 mg total) by mouth daily.   gabapentin 100 MG capsule Commonly known as: NEURONTIN Take 1 capsule 5 times a day by oral route.   gabapentin 600 MG tablet Commonly known as: NEURONTIN 1 po 3x a day and 2 at bedtime   ipratropium-albuterol 0.5-2.5 (3) MG/3ML Soln Commonly known as: DUONEB Take 3 mLs by  nebulization every 4 (four) hours as needed.   L-Methylfolate 15 MG Tabs Take 1 tablet (15 mg total) by mouth daily.   MAGNESIUM GLYCINATE PO Take 2 tablets by mouth daily.   methylPREDNISolone 4 MG Tbpk tablet Commonly known as: MEDROL DOSEPAK Use as directed   Metoprolol Succinate 25 MG Cs24 Take 1 capsule every day by oral route.   metoprolol succinate 25 MG 24 hr tablet Commonly known as: TOPROL-XL Take 1 tablet (25 mg total) by mouth daily.   mirabegron ER 25 MG Tb24 tablet Commonly known as: MYRBETRIQ Take 1 tablet (25 mg total) by mouth daily.   mometasone-formoterol 200-5 MCG/ACT Aero Commonly known as: DULERA Inhale 2 puffs into the lungs in the morning and at bedtime.   montelukast 10 MG tablet Commonly known as: SINGULAIR TAKE ONE TABLET BY MOUTH AT BEDTIME   mupirocin ointment 2 % Commonly known as: BACTROBAN APPLY A SMALL AMOUNT TO THE AFFECTED AREA BY TOPICAL ROUTE 3 TIMES PER DAY   nystatin 100000 UNIT/ML suspension Commonly known as: MYCOSTATIN Use as directed 5 mLs (500,000 Units total) in the mouth or throat 4 (four) times daily.   nystatin cream Commonly known as: MYCOSTATIN Apply 1 application topically 2 (two) times daily.   nystatin-triamcinolone ointment Commonly known as: MYCOLOG Apply 1 application topically 2 (two) times daily.   Omega 3 1000 MG Caps Take 1 capsule by mouth daily.   omeprazole 40 MG capsule Commonly known as: PRILOSEC Take 1 capsule (40 mg total) by mouth 2 (two) times daily.   oxyCODONE-acetaminophen 7.5-325 MG tablet Commonly known as: Percocet Take 1 tablet by mouth 5 (five) times daily as needed for moderate pain. No More Than 5 a day.   predniSONE 5 MG tablet Commonly known as: DELTASONE Take 1 tablet (5 mg total) by mouth every morning.   PROBIOTIC-10 PO Take by mouth.   promethazine 12.5 MG tablet Commonly known as: PHENERGAN Take 1 tablet (12.5 mg total) by mouth every 8 (eight) hours as needed for  nausea or vomiting.   Qnasl 80 MCG/ACT Aers Generic drug: Beclomethasone Dipropionate Spray 2 sprays every day by intranasal route.   sulfamethoxazole-trimethoprim 800-160 MG tablet Commonly known as: BACTRIM DS Take 1 tablet every 12 hours by oral route.   tamsulosin 0.4 MG Caps capsule Commonly known as: FLOMAX TAKE ONE (1) CAPSULE EACH DAY   Ventolin HFA 108 (90 Base) MCG/ACT inhaler Generic drug: albuterol USE 2 PUFFS EVERY 4 TO 6 HOURS AS NEEDED   VITAMIN C ADULT GUMMIES PO Take 2 each by mouth daily.   Vitamin D (Ergocalciferol) 1.25 MG (50000 UNIT) Caps capsule Commonly known as: DRISDOL TAKE 1 CAPSULE BY MOUTH EVERY 7 DAYS   vitamin E 180 MG (400 UNITS) capsule Take 1,000 Units by mouth daily.   ZINC PO Take 1 tablet by mouth daily.    Past Medical History:  Diagnosis Date   Allergy    Arthritis  HANDS,HIPS,KNEES   Asthma    Bronchitis, chronic/intermittent 01/22/2012   Depression 01/22/2012   GERD (gastroesophageal reflux disease)    Hyperlipidemia    IBS (irritable bowel syndrome) 01/22/2012   Lupus (HCC)    Neuromuscular disorder (HCC)    Osteoporosis    Seizures (HCC)    SOB (shortness of breath)    Thyroid disease     Past Surgical History:  Procedure Laterality Date   46 HOUR [place] STUDY N/A 10/29/2015   Procedure: 24 HOUR [place] STUDY;  Surgeon: Iva Boop, MD;  [facility];  Service: Endoscopy;  Laterality: N/A;   COLONOSCOPY     ESOPHAGEAL MANOMETRY N/A 10/29/2015   Procedure: ESOPHAGEAL MANOMETRY (EM);  Surgeon: Iva Boop, MD;  [facility];  Service: Endoscopy;  Laterality: N/A;   KNEE SURGERY  2002/2003   bil   RECTAL SURGERY  correction of prolapse   2010    Review of systems negative except as noted in HPI / PMHx or noted below:  Review of Systems  Constitutional: Negative.   HENT: Negative.    Eyes: Negative.   Respiratory: Negative.    Cardiovascular: Negative.   Gastrointestinal: Negative.    Genitourinary: Negative.   Musculoskeletal: Negative.   Skin: Negative.   Neurological: Negative.   Endo/Heme/Allergies: Negative.   Psychiatric/Behavioral: Negative.       Objective:   Vitals:   09/02/22 1337  BP: 118/68  Pulse: 90  Resp: 20  Temp: 98.5 F (36.9 C)  SpO2: 96%   Height: 5\' 8"  (172.7 cm)  Weight: 200 lb (90.7 kg)   Physical Exam Constitutional:      Appearance: She is not diaphoretic.  HENT:     Head: Normocephalic.     Right Ear: Tympanic membrane, ear canal and external ear normal.     Left Ear: Tympanic membrane, ear canal and external ear normal.     Nose: Nose normal. No mucosal edema or rhinorrhea.     Mouth/Throat:     Pharynx: Uvula midline. No oropharyngeal exudate.  Eyes:     Conjunctiva/sclera: Conjunctivae normal.  Neck:     Thyroid: No thyromegaly.     Trachea: Trachea normal. No tracheal tenderness or tracheal deviation.  Cardiovascular:     Rate and Rhythm: Normal rate and regular rhythm.     Heart sounds: Normal heart sounds, S1 normal and S2 normal. No murmur heard. Pulmonary:     Effort: No respiratory distress.     Breath sounds: Normal breath sounds. No stridor. No wheezing or rales.  Lymphadenopathy:     Head:     Right side of head: No tonsillar adenopathy.     Left side of head: No tonsillar adenopathy.     Cervical: No cervical adenopathy.  Skin:    Findings: No erythema or rash.     Nails: There is no clubbing.  Neurological:     Mental Status: She is alert.     Diagnostics:    Spirometry was performed and demonstrated an FEV1 of 2.30 at 66 % of predicted.  The patient had an Asthma Control Test with the following results: ACT Total Score: 20.    Assessment and Plan:   No diagnosis found.   1.  Allergen avoidance measures  - dust mite  2.  Continue to treat and prevent inflammation:  A.  Dulera 200 - 2 inhalations 1-2 times per day w/ spacer  B.  Montelukast 10 mg -1 tablet 1 time per day  3.  Treat  and prevent reflux/LPR:  A.  Minimize all forms of caffeine consumption B.  Omeprazole 40 mg -1 tablet twice a day C.  Famotidine 40 mg -1 tablet in evening D.  Replace throat clearing with swallowing maneuver E.  Nissen fundoplication???  4.  If needed:  A. Albuterol HFA - 2 inhalations or nebulization every 4-6 hours B.  Antihistamine-cetirizine  5.  Return to clinic in 6 months or earlier if problem  6.  Obtain fall flu vaccine  7.  Wegovy???  Overall Nastacia has done very well with her airway issue while continuing to use anti-inflammatory agents on a consistent basis.  She has a very good understanding of this disease process and how her medications work and appropriate dosing of her medications depending on disease activity.  Her reflux is controlled but certainly is still an active issue.  Other than a Nissen fundoplication the only other manipulation I can think of is for her to lose weight and she appears to be stuck regarding losing weight at this point in time.  I have asked her to research [facility] and I will be happy to order this medication if she would like to start [facility].  Laurette Schimke, MD Allergy / Immunology  [facility]

## 2022-09-02 NOTE — Patient Instructions (Addendum)
  1.  Allergen avoidance measures  - dust mite  2.  Continue to treat and prevent inflammation:  A.  Dulera 200 - 2 inhalations 1-2 times per day w/ spacer  B.  Montelukast 10 mg -1 tablet 1 time per day  3.  Treat and prevent reflux/LPR:  A.  Minimize all forms of caffeine consumption B.  Omeprazole 40 mg -1 tablet twice a day C.  Famotidine 40 mg -1 tablet in evening D.  Replace throat clearing with swallowing maneuver E.  Nissen fundoplication???  4.  If needed:  A. Albuterol HFA - 2 inhalations or nebulization every 4-6 hours B.  Antihistamine-cetirizine  5.  Return to clinic in 6 months or earlier if problem  6.  Obtain fall flu vaccine  7.  Wegovy???

## 2022-09-03 ENCOUNTER — Other Ambulatory Visit: Payer: Self-pay | Admitting: Nurse Practitioner

## 2022-09-03 ENCOUNTER — Encounter: Payer: Self-pay | Admitting: Allergy and Immunology

## 2022-09-08 ENCOUNTER — Ambulatory Visit
Admission: RE | Admit: 2022-09-08 | Discharge: 2022-09-08 | Disposition: A | Discharge status: Home / Self Care | Payer: Medicare Other | Source: Ambulatory Visit | Attending: Nurse Practitioner | Admitting: Nurse Practitioner

## 2022-09-08 ENCOUNTER — Ambulatory Visit (HOSPITAL_COMMUNITY)
Admission: RE | Admit: 2022-09-08 | Discharge: 2022-09-08 | Disposition: A | Discharge status: Home / Self Care | Payer: Medicare Other | Source: Ambulatory Visit | Attending: Urology | Admitting: Urology

## 2022-09-08 DIAGNOSIS — K4090 Unilateral inguinal hernia, without obstruction or gangrene, not specified as recurrent: Secondary | ICD-10-CM | POA: Diagnosis not present

## 2022-09-08 DIAGNOSIS — N200 Calculus of kidney: Secondary | ICD-10-CM | POA: Diagnosis not present

## 2022-09-08 DIAGNOSIS — Z1231 Encounter for screening mammogram for malignant neoplasm of breast: Secondary | ICD-10-CM

## 2022-09-10 ENCOUNTER — Encounter: Payer: Self-pay | Admitting: Urology

## 2022-09-15 ENCOUNTER — Ambulatory Visit: Payer: Medicare Other | Admitting: Urology

## 2022-09-15 ENCOUNTER — Encounter: Payer: Self-pay | Admitting: Urology

## 2022-09-15 VITALS — BP 124/85 | HR 89 | Ht 68.0 in | Wt 200.0 lb

## 2022-09-15 DIAGNOSIS — N200 Calculus of kidney: Secondary | ICD-10-CM | POA: Diagnosis not present

## 2022-09-15 DIAGNOSIS — R350 Frequency of micturition: Secondary | ICD-10-CM

## 2022-09-15 LAB — URINALYSIS, ROUTINE W REFLEX MICROSCOPIC
Bilirubin, UA: NEGATIVE
Glucose, UA: NEGATIVE
Ketones, UA: NEGATIVE
Leukocytes,UA: NEGATIVE
Nitrite, UA: NEGATIVE
Protein,UA: NEGATIVE
RBC, UA: NEGATIVE
Specific Gravity, UA: 1.01 (ref 1.005–1.030)
Urobilinogen, Ur: 0.2 mg/dL (ref 0.2–1.0)
pH, UA: 6.5 (ref 5.0–7.5)

## 2022-09-15 MED ORDER — MIRABEGRON ER 25 MG PO TB24: 25.0000 mg | ORAL_TABLET | Freq: Every day | ORAL | 11 refills | Status: DC

## 2022-09-15 NOTE — Progress Notes (Signed)
Assessment: 1. Nephrolithiasis   2. Urinary frequency     Plan: I personally reviewed the CT study from 09/08/2022 showing bilateral nonobstructing renal calculi.  Results discussed with the patient today.   Continue stone prevention  Continue Myrbetriq 25 mg daily.  Samples and rx given. Continue tamsulosin Return to office in 3 months  Chief Complaint:  Chief Complaint  Patient presents with   Nephrolithiasis    History of Present Illness:  Autumn Davis is a 36 y.o. female who is seen for continued evaluation of nephrolithiasis and urinary frequency.   She has a history of nephrolithiasis and was previously followed by Dr. Exie Parody in [place].  She has passed her stones previously.  Her last stone episode was approximately 1 year ago.  No recent flank pain.  No dysuria or gross hematuria.  She does have chronic back pain and takes pain medication daily. She was recently evaluated with plain films of the lumbar spine which showed bilateral renal calcifications with the largest measuring 7 mm in the right kidney. CT imaging from 09/08/2022 showed multiple small bilateral nonobstructive renal calculi, no ureteral calculi or hydronephrosis.  She has a history of urinary symptoms including frequency, urgency, and sensation of incomplete emptying.  She has previously been managed with tamsulosin and Myrbetriq.  She had not been on Myrbetriq recently due to her insurance coverage of this medication.  She continued on tamsulosin daily.  She reported occasional incontinence.  She was not using pads on a regular basis.  No recent UTIs. PVR = 99 mL 10/23 She was restarted on Myrbetriq 25 mg daily.  She continued on tamsulosin.  She returns today for follow-up.  She is doing much better since restarting the Myrbetriq 25 mg daily.  She has decreased frequency and urgency.  No side effects.  She continues on tamsulosin.  No dysuria or gross hematuria.  No flank pain.  Portions of the above  documentation were copied from a prior visit for review purposes only.    Past Medical History:  Past Medical History:  Diagnosis Date   Allergy    Arthritis    HANDS,HIPS,KNEES   Asthma    Bronchitis, chronic/intermittent 01/22/2012   Depression 01/22/2012   GERD (gastroesophageal reflux disease)    Hyperlipidemia    IBS (irritable bowel syndrome) 01/22/2012   Lupus ([place])    Neuromuscular disorder (HCC)    Osteoporosis    Seizures (HCC)    SOB (shortness of breath)    Thyroid disease     Past Surgical History:  Past Surgical History:  Procedure Laterality Date   40 HOUR [place] STUDY N/A 10/29/2015   Procedure: 24 HOUR [place] STUDY;  Surgeon: Gatha Mayer, MD;  [facility];  Service: Endoscopy;  Laterality: N/A;   COLONOSCOPY     ESOPHAGEAL MANOMETRY N/A 10/29/2015   Procedure: ESOPHAGEAL MANOMETRY (EM);  Surgeon: Gatha Mayer, MD;  [facility];  Service: Endoscopy;  Laterality: N/A;   KNEE SURGERY  2002/2003   bil   RECTAL SURGERY  correction of prolapse   2010    Allergies:  Allergies  Allergen Reactions   Ciprofloxacin Other (See Comments)    Muscle weakness and numbness Other reaction(s): Not available   Compazine Other (See Comments)    hallucinations   Prochlorperazine Other (See Comments) and Rash    Pt states it makes her feel like things are crawling on her Other reaction(s): Not available    Family History:  Family  History  Problem Relation Age of Onset   Thyroid disease Mother    Colon polyps Father    Lung cancer Maternal Grandmother    Cancer Paternal Grandmother        colon/pancreatiec/lymphoma   Irritable bowel syndrome Brother    AAA (abdominal aortic aneurysm) Neg Hx     Social History:  Social History   Tobacco Use   Smoking status: Never   Smokeless tobacco: Never  Vaping Use   Vaping Use: Never used  Substance Use Topics   Alcohol use: No    Alcohol/week: 0.0 standard drinks of alcohol   Drug use: No     ROS: Constitutional:  Negative for fever, chills, weight loss CV: Negative for chest pain, previous MI, hypertension Respiratory:  Negative for shortness of breath, wheezing, sleep apnea, frequent cough GI:  Negative for nausea, vomiting, bloody stool, GERD  Physical exam: BP 124/85   Pulse 89   Ht 5\' 8"  (1.727 m)   Wt 200 lb (90.7 kg)   BMI 30.41 kg/m  GENERAL APPEARANCE:  Well appearing, well developed, well nourished, NAD HEENT:  Atraumatic, normocephalic, oropharynx clear NECK:  Supple without lymphadenopathy or thyromegaly ABDOMEN:  Soft, non-tender, no masses EXTREMITIES:  Moves all extremities well, without clubbing, cyanosis, or edema NEUROLOGIC:  Alert and oriented x 3, normal gait, CN II-XII grossly intact MENTAL STATUS:  appropriate BACK:  Non-tender to palpation, No CVAT SKIN:  Warm, dry, and intact  Results: U/A: dipstick negative

## 2022-09-22 ENCOUNTER — Encounter: Payer: Medicare Other | Admitting: Registered Nurse

## 2022-09-22 NOTE — Progress Notes (Deleted)
Subjective:    Patient ID: Autumn Davis, female    DOB: Oct 15, 1986, 36 y.o.   MRN: [id number]  HPI   Pain Inventory Average Pain {NUMBERS; 0-10:5044} Pain Right Now {NUMBERS; 0-10:5044} My pain is {PAIN DESCRIPTION:21022940}  In the last 24 hours, has pain interfered with the following? General activity {NUMBERS; 0-10:5044} Relation with others {NUMBERS; 0-10:5044} Enjoyment of life {NUMBERS; 0-10:5044} What TIME of day is your pain at its worst? {time of day:24191} Sleep (in general) {BHH GOOD/FAIR/POOR:22877}  Pain is worse with: {ACTIVITIES:21022942} Pain improves with: {PAIN IMPROVES IEPP:29518841} Relief from Meds: {NUMBERS; 0-10:5044}  Family History  Problem Relation Age of Onset   Thyroid disease Mother    Colon polyps Father    Lung cancer Maternal Grandmother    Cancer Paternal Grandmother        colon/pancreatiec/lymphoma   Irritable bowel syndrome Brother    AAA (abdominal aortic aneurysm) Neg Hx    Social History   Socioeconomic History   Marital status: Legally Separated    Spouse name: Not on file   Number of children: 1   Years of education: Not on file   Highest education level: Not on file  Occupational History   Occupation: disabled    Employer: [employer]  Tobacco Use   Smoking status: Never   Smokeless tobacco: Never  Vaping Use   Vaping Use: Never used  Substance and Sexual Activity   Alcohol use: No    Alcohol/week: 0.0 standard drinks of alcohol   Drug use: No   Sexual activity: Not on file  Other Topics Concern   Not on file  Social History Narrative   She lives alone with her 18 year old son - right now - they are living with her parents right now   Social Determinants of Health   Financial Resource Strain: Low Risk  (05/01/2022)   Overall Financial Resource Strain (CARDIA)    Difficulty of Paying Living Expenses: Not hard at all  Food Insecurity: No Food Insecurity (05/01/2022)   Hunger Vital Sign     Worried About Running Out of Food in the Last Year: Never true    Ran Out of Food in the Last Year: Never true  Transportation Needs: No Transportation Needs (05/01/2022)   PRAPARE - Administrator, Civil Service (Medical): No    Lack of Transportation (Non-Medical): No  Physical Activity: Insufficiently Active (05/01/2022)   Exercise Vital Sign    Days of Exercise per Week: 7 days    Minutes of Exercise per Session: 20 min  Stress: No Stress Concern Present (05/01/2022)   [employer] of Occupational Health - Occupational Stress Questionnaire    Feeling of Stress : Only a little  Social Connections: Moderately Integrated (05/01/2022)   Social Connection and Isolation Panel [NHANES]    Frequency of Communication with Friends and Family: More than three times a week    Frequency of Social Gatherings with Friends and Family: More than three times a week    Attends Religious Services: More than 4 times per year    Active Member of Clubs or Organizations: Yes    Attends Banker Meetings: More than 4 times per year    Marital Status: Separated   Past Surgical History:  Procedure Laterality Date   24 HOUR PH STUDY N/A 10/29/2015   Procedure: 24 HOUR PH STUDY;  Surgeon: Iva Boop, MD;  Location: [facility];  Service: Endoscopy;  Laterality: N/A;  COLONOSCOPY     ESOPHAGEAL MANOMETRY N/A 10/29/2015   Procedure: ESOPHAGEAL MANOMETRY (EM);  Surgeon: Iva Boop, MD;  Location: [facility];  Service: Endoscopy;  Laterality: N/A;   KNEE SURGERY  2002/2003   bil   RECTAL SURGERY  correction of prolapse   2010   Past Surgical History:  Procedure Laterality Date   64 HOUR PH STUDY N/A 10/29/2015   Procedure: 24 HOUR PH STUDY;  Surgeon: Iva Boop, MD;  Location: [facility];  Service: Endoscopy;  Laterality: N/A;   COLONOSCOPY     ESOPHAGEAL MANOMETRY N/A 10/29/2015   Procedure: ESOPHAGEAL MANOMETRY (EM);  Surgeon: Iva Boop, MD;  Location: [facility];  Service: Endoscopy;  Laterality: N/A;   KNEE SURGERY  2002/2003   bil   RECTAL SURGERY  correction of prolapse   2010   Past Medical History:  Diagnosis Date   Allergy    Arthritis    HANDS,HIPS,KNEES   Asthma    Bronchitis, chronic/intermittent 01/22/2012   Depression 01/22/2012   GERD (gastroesophageal reflux disease)    Hyperlipidemia    IBS (irritable bowel syndrome) 01/22/2012   Lupus (HCC)    Neuromuscular disorder (HCC)    Osteoporosis    Seizures (HCC)    SOB (shortness of breath)    Thyroid disease    There were no vitals taken for this visit.  Opioid Risk Score:   Fall Risk Score:  `1  Depression screen [facility] 2/9     07/28/2022   11:31 AM 05/22/2022   12:20 PM 05/01/2022   10:00 AM 02/24/2022    8:32 AM 12/10/2021   11:32 AM 11/25/2021    1:58 PM 09/30/2021    1:08 PM  Depression screen PHQ 2/9  Decreased Interest 0 1 1 0 0 1 0  Down, Depressed, Hopeless 0 0 1 0 0 0 0  PHQ - 2 Score 0 1 2 0 0 1 0  Altered sleeping  1 1   1    Tired, decreased energy  1 1   1    Change in appetite  0 1   1   Feeling bad or failure about yourself   0 0   0   Trouble concentrating  0 0   0   Moving slowly or fidgety/restless  0 0   0   Suicidal thoughts  0 0   0   PHQ-9 Score  3 5   4    Difficult doing work/chores  Not difficult at all Somewhat difficult   Somewhat difficult     Review of Systems     Objective:   Physical Exam        Assessment & Plan:

## 2022-09-29 ENCOUNTER — Encounter: Payer: Medicare Other | Attending: Registered Nurse | Admitting: Registered Nurse

## 2022-09-29 ENCOUNTER — Encounter: Payer: Self-pay | Admitting: Registered Nurse

## 2022-09-29 VITALS — BP 123/83 | HR 69 | Ht 68.0 in | Wt 209.6 lb

## 2022-09-29 DIAGNOSIS — G8929 Other chronic pain: Secondary | ICD-10-CM | POA: Diagnosis not present

## 2022-09-29 DIAGNOSIS — R569 Unspecified convulsions: Secondary | ICD-10-CM | POA: Insufficient documentation

## 2022-09-29 DIAGNOSIS — G609 Hereditary and idiopathic neuropathy, unspecified: Secondary | ICD-10-CM | POA: Diagnosis present

## 2022-09-29 DIAGNOSIS — G894 Chronic pain syndrome: Secondary | ICD-10-CM | POA: Diagnosis not present

## 2022-09-29 DIAGNOSIS — M2550 Pain in unspecified joint: Secondary | ICD-10-CM | POA: Diagnosis not present

## 2022-09-29 DIAGNOSIS — M25512 Pain in left shoulder: Secondary | ICD-10-CM | POA: Diagnosis not present

## 2022-09-29 DIAGNOSIS — M546 Pain in thoracic spine: Secondary | ICD-10-CM | POA: Diagnosis not present

## 2022-09-29 DIAGNOSIS — Z79891 Long term (current) use of opiate analgesic: Secondary | ICD-10-CM | POA: Insufficient documentation

## 2022-09-29 DIAGNOSIS — M797 Fibromyalgia: Secondary | ICD-10-CM | POA: Insufficient documentation

## 2022-09-29 DIAGNOSIS — M25511 Pain in right shoulder: Secondary | ICD-10-CM | POA: Insufficient documentation

## 2022-09-29 DIAGNOSIS — M62838 Other muscle spasm: Secondary | ICD-10-CM | POA: Insufficient documentation

## 2022-09-29 DIAGNOSIS — M7061 Trochanteric bursitis, right hip: Secondary | ICD-10-CM | POA: Insufficient documentation

## 2022-09-29 DIAGNOSIS — Z5181 Encounter for therapeutic drug level monitoring: Secondary | ICD-10-CM | POA: Insufficient documentation

## 2022-09-29 DIAGNOSIS — M7062 Trochanteric bursitis, left hip: Secondary | ICD-10-CM | POA: Insufficient documentation

## 2022-09-29 MED ORDER — OXYCODONE-ACETAMINOPHEN 7.5-325 MG PO TABS: 1.0000 | ORAL_TABLET | Freq: Every day | ORAL | 0 refills | Status: DC | PRN

## 2022-09-29 NOTE — Progress Notes (Unsigned)
Subjective:    Patient ID: Autumn Davis, female    DOB: Apr 21, 1986, 36 y.o.   MRN: [id number]  HPI: Autumn Davis is a 36 y.o. female who returns for follow up appointment for chronic pain and medication refill. states *** pain is located in  ***. rates pain ***. current exercise regime is walking and performing stretching exercises.  Autumn Davis Morphine equivalent is *** MME.      Pain Inventory Average Pain 7 Pain Right Now 4 My pain is sharp, burning, stabbing, tingling, and aching  In the last 24 hours, has pain interfered with the following? General activity 8 Relation with others 6 Enjoyment of life 4 What TIME of day is your pain at its worst? evening and night Sleep (in general) Poor  Pain is worse with: walking, bending, sitting, standing, and some activites Pain improves with: rest, heat/ice, therapy/exercise, pacing activities, medication, and TENS Relief from Meds: 7  Family History  Problem Relation Age of Onset   Thyroid disease Mother    Colon polyps Father    Lung cancer Maternal Grandmother    Cancer Paternal Grandmother        colon/pancreatiec/lymphoma   Irritable bowel syndrome Brother    AAA (abdominal aortic aneurysm) Neg Hx    Social History   Socioeconomic History   Marital status: Legally Separated    Spouse name: Not on file   Number of children: 1   Years of education: Not on file   Highest education level: Not on file  Occupational History   Occupation: disabled    Employer: [employer]  Tobacco Use   Smoking status: Never   Smokeless tobacco: Never  Vaping Use   Vaping Use: Never used  Substance and Sexual Activity   Alcohol use: No    Alcohol/week: 0.0 standard drinks of alcohol   Drug use: No   Sexual activity: Not on file  Other Topics Concern   Not on file  Social History Narrative   She lives alone with her 51 year old son - right now - they are living with her parents right now    Social Determinants of Health   Financial Resource Strain: Low Risk  (05/01/2022)   Overall Financial Resource Strain (CARDIA)    Difficulty of Paying Living Expenses: Not hard at all  Food Insecurity: No Food Insecurity (05/01/2022)   Hunger Vital Sign    Worried About Running Out of Food in the Last Year: Never true    Ran Out of Food in the Last Year: Never true  Transportation Needs: No Transportation Needs (05/01/2022)   PRAPARE - Administrator, Civil Service (Medical): No    Lack of Transportation (Non-Medical): No  Physical Activity: Insufficiently Active (05/01/2022)   Exercise Vital Sign    Days of Exercise per Week: 7 days    Minutes of Exercise per Session: 20 min  Stress: No Stress Concern Present (05/01/2022)   [employer] of Occupational Health - Occupational Stress Questionnaire    Feeling of Stress : Only a little  Social Connections: Moderately Integrated (05/01/2022)   Social Connection and Isolation Panel [NHANES]    Frequency of Communication with Friends and Family: More than three times a week    Frequency of Social Gatherings with Friends and Family: More than three times a week    Attends Religious Services: More than 4 times per year    Active Member of [employer] or Organizations: Yes  Attends Banker Meetings: More than 4 times per year    Marital Status: Separated   Past Surgical History:  Procedure Laterality Date   73 HOUR PH STUDY N/A 10/29/2015   Procedure: 24 HOUR PH STUDY;  Surgeon: Iva Boop, MD;  Location: [facility];  Service: Endoscopy;  Laterality: N/A;   COLONOSCOPY     ESOPHAGEAL MANOMETRY N/A 10/29/2015   Procedure: ESOPHAGEAL MANOMETRY (EM);  Surgeon: Iva Boop, MD;  Location: [facility];  Service: Endoscopy;  Laterality: N/A;   KNEE SURGERY  2002/2003   bil   RECTAL SURGERY  correction of prolapse   2010   Past Surgical History:  Procedure Laterality Date   61 HOUR PH STUDY N/A 10/29/2015    Procedure: 24 HOUR PH STUDY;  Surgeon: Iva Boop, MD;  Location: [facility];  Service: Endoscopy;  Laterality: N/A;   COLONOSCOPY     ESOPHAGEAL MANOMETRY N/A 10/29/2015   Procedure: ESOPHAGEAL MANOMETRY (EM);  Surgeon: Iva Boop, MD;  Location: [facility];  Service: Endoscopy;  Laterality: N/A;   KNEE SURGERY  2002/2003   bil   RECTAL SURGERY  correction of prolapse   2010   Past Medical History:  Diagnosis Date   Allergy    Arthritis    HANDS,HIPS,KNEES   Asthma    Bronchitis, chronic/intermittent 01/22/2012   Depression 01/22/2012   GERD (gastroesophageal reflux disease)    Hyperlipidemia    IBS (irritable bowel syndrome) 01/22/2012   Lupus (HCC)    Neuromuscular disorder (HCC)    Osteoporosis    Seizures (HCC)    SOB (shortness of breath)    Thyroid disease    There were no vitals taken for this visit.  Opioid Risk Score:   Fall Risk Score:  `1  Depression screen [facility] 2/9     07/28/2022   11:31 AM 05/22/2022   12:20 PM 05/01/2022   10:00 AM 02/24/2022    8:32 AM 12/10/2021   11:32 AM 11/25/2021    1:58 PM 09/30/2021    1:08 PM  Depression screen PHQ 2/9  Decreased Interest 0 1 1 0 0 1 0  Down, Depressed, Hopeless 0 0 1 0 0 0 0  PHQ - 2 Score 0 1 2 0 0 1 0  Altered sleeping  1 1   1    Tired, decreased energy  1 1   1    Change in appetite  0 1   1   Feeling bad or failure about yourself   0 0   0   Trouble concentrating  0 0   0   Moving slowly or fidgety/restless  0 0   0   Suicidal thoughts  0 0   0   PHQ-9 Score  3 5   4    Difficult doing work/chores  Not difficult at all Somewhat difficult   Somewhat difficult       Review of Systems  Constitutional: Negative.   Eyes: Negative.   Respiratory: Negative.    Endocrine: Negative.   Genitourinary: Negative.   Musculoskeletal:  Positive for arthralgias, back pain and myalgias.  Skin: Negative.   Allergic/Immunologic: Negative.   Hematological: Negative.   Psychiatric/Behavioral: Negative.         Objective:   Physical Exam        Assessment & Plan:  1. Chronic seizure disorder: No seizure's. Continue current medication regimen with  Gabapentin. Neurology Following. 05/09/2022. 2. Chronic muscle spasms, weakness with associated pain disorder:  Continue current medication regimen with Baclofen. Continue with Exercise regime. 05/09/2022. 3. Chronic dysphagia: No complaints today. Continue to monitor. GI Following. 05/09/2022. 4. Anxiety with depression : No complaints today. Stable. Continue current medication regimen with  Elavil. 05/09/2022. 5. Fibromyalgia/ Rib Pain/Chronic Pain: Continue Diclofenac: Continue with exercise and heat Therapy. 05/09/2022. Refilled:  oxyCODONE 7.5/325mg  one tablet 5 times  daily as needed  #140. Second script sent for the following month. Continue with slow weaning.   We will continue the opioid monitoring program, this consists of regular clinic visits, examinations, urine drug screen, pill counts as well as use of [place] Controlled Substance Reporting system. A 12 month History has been reviewed on the [place] Controlled Substance Reporting System 05/09/2022.  6. Peripheral Neuropathy: Continue current medication regimen with  Gabapentin: 05/09/2022. 7. Fall at Home: Educated on Fall Prevention:: She [employer].     F/U in 2 months

## 2022-10-08 ENCOUNTER — Other Ambulatory Visit: Payer: Self-pay | Admitting: Nurse Practitioner

## 2022-10-24 ENCOUNTER — Ambulatory Visit (INDEPENDENT_AMBULATORY_CARE_PROVIDER_SITE_OTHER): Payer: Medicare Other

## 2022-10-24 DIAGNOSIS — Z23 Encounter for immunization: Secondary | ICD-10-CM | POA: Diagnosis not present

## 2022-10-30 ENCOUNTER — Other Ambulatory Visit: Payer: Self-pay | Admitting: Nurse Practitioner

## 2022-10-30 DIAGNOSIS — R350 Frequency of micturition: Secondary | ICD-10-CM

## 2022-11-05 DIAGNOSIS — Z7952 Long term (current) use of systemic steroids: Secondary | ICD-10-CM | POA: Diagnosis not present

## 2022-11-05 DIAGNOSIS — M816 Localized osteoporosis [Lequesne]: Secondary | ICD-10-CM | POA: Diagnosis not present

## 2022-11-24 ENCOUNTER — Ambulatory Visit: Payer: Medicare Other | Admitting: Nurse Practitioner

## 2022-11-26 ENCOUNTER — Other Ambulatory Visit: Payer: Self-pay | Admitting: Nurse Practitioner

## 2022-11-26 ENCOUNTER — Encounter: Payer: Self-pay | Admitting: Nurse Practitioner

## 2022-11-26 ENCOUNTER — Ambulatory Visit (INDEPENDENT_AMBULATORY_CARE_PROVIDER_SITE_OTHER): Payer: Medicare Other | Admitting: Nurse Practitioner

## 2022-11-26 VITALS — BP 131/87 | HR 95 | Temp 97.9°F | Resp 20 | Ht 68.0 in | Wt 216.0 lb

## 2022-11-26 DIAGNOSIS — E782 Mixed hyperlipidemia: Secondary | ICD-10-CM | POA: Diagnosis not present

## 2022-11-26 DIAGNOSIS — R350 Frequency of micturition: Secondary | ICD-10-CM

## 2022-11-26 DIAGNOSIS — R1319 Other dysphagia: Secondary | ICD-10-CM

## 2022-11-26 DIAGNOSIS — F3342 Major depressive disorder, recurrent, in full remission: Secondary | ICD-10-CM

## 2022-11-26 DIAGNOSIS — M62838 Other muscle spasm: Secondary | ICD-10-CM | POA: Diagnosis not present

## 2022-11-26 DIAGNOSIS — R569 Unspecified convulsions: Secondary | ICD-10-CM | POA: Diagnosis not present

## 2022-11-26 DIAGNOSIS — M797 Fibromyalgia: Secondary | ICD-10-CM

## 2022-11-26 DIAGNOSIS — I479 Paroxysmal tachycardia, unspecified: Secondary | ICD-10-CM

## 2022-11-26 DIAGNOSIS — E079 Disorder of thyroid, unspecified: Secondary | ICD-10-CM | POA: Diagnosis not present

## 2022-11-26 DIAGNOSIS — D8989 Other specified disorders involving the immune mechanism, not elsewhere classified: Secondary | ICD-10-CM

## 2022-11-26 DIAGNOSIS — R609 Edema, unspecified: Secondary | ICD-10-CM

## 2022-11-26 DIAGNOSIS — M818 Other osteoporosis without current pathological fracture: Secondary | ICD-10-CM

## 2022-11-26 DIAGNOSIS — Z6833 Body mass index (BMI) 33.0-33.9, adult: Secondary | ICD-10-CM

## 2022-11-26 DIAGNOSIS — K581 Irritable bowel syndrome with constipation: Secondary | ICD-10-CM | POA: Diagnosis not present

## 2022-11-26 DIAGNOSIS — K219 Gastro-esophageal reflux disease without esophagitis: Secondary | ICD-10-CM

## 2022-11-26 DIAGNOSIS — M329 Systemic lupus erythematosus, unspecified: Secondary | ICD-10-CM

## 2022-11-26 DIAGNOSIS — E559 Vitamin D deficiency, unspecified: Secondary | ICD-10-CM

## 2022-11-26 DIAGNOSIS — R600 Localized edema: Secondary | ICD-10-CM

## 2022-11-26 MED ORDER — ATORVASTATIN CALCIUM 40 MG PO TABS: ORAL_TABLET | ORAL | 1 refills | Status: DC

## 2022-11-26 MED ORDER — MIRABEGRON ER 25 MG PO TB24: 25.0000 mg | ORAL_TABLET | Freq: Every day | ORAL | 11 refills | Status: DC

## 2022-11-26 MED ORDER — MONTELUKAST SODIUM 10 MG PO TABS: 10.0000 mg | ORAL_TABLET | Freq: Every day | ORAL | 1 refills | Status: DC

## 2022-11-26 MED ORDER — GABAPENTIN 600 MG PO TABS: ORAL_TABLET | ORAL | 1 refills | Status: DC

## 2022-11-26 MED ORDER — FUROSEMIDE 20 MG PO TABS: 20.0000 mg | ORAL_TABLET | Freq: Every day | ORAL | 1 refills | Status: DC

## 2022-11-26 MED ORDER — PROMETHAZINE HCL 12.5 MG PO TABS: 12.5000 mg | ORAL_TABLET | Freq: Three times a day (TID) | ORAL | 0 refills | Status: DC | PRN

## 2022-11-26 MED ORDER — BACLOFEN 20 MG PO TABS: ORAL_TABLET | ORAL | 1 refills | Status: DC

## 2022-11-26 MED ORDER — TAMSULOSIN HCL 0.4 MG PO CAPS: 0.4000 mg | ORAL_CAPSULE | Freq: Every day | ORAL | 1 refills | Status: DC

## 2022-11-26 MED ORDER — AMITRIPTYLINE HCL 75 MG PO TABS: 75.0000 mg | ORAL_TABLET | Freq: Every day | ORAL | 1 refills | Status: DC

## 2022-11-26 MED ORDER — FAMOTIDINE 40 MG PO TABS: 40.0000 mg | ORAL_TABLET | Freq: Every day | ORAL | 5 refills | Status: DC

## 2022-11-26 MED ORDER — MOMETASONE FURO-FORMOTEROL FUM 200-5 MCG/ACT IN AERO: 2.0000 | INHALATION_SPRAY | Freq: Two times a day (BID) | RESPIRATORY_TRACT | 5 refills | Status: DC

## 2022-11-26 MED ORDER — ACETAZOLAMIDE 250 MG PO TABS: ORAL_TABLET | ORAL | 1 refills | Status: DC

## 2022-11-26 MED ORDER — METOPROLOL SUCCINATE ER 25 MG PO TB24: 25.0000 mg | ORAL_TABLET | Freq: Every day | ORAL | 1 refills | Status: DC

## 2022-11-26 MED ORDER — OMEPRAZOLE 40 MG PO CPDR: 40.0000 mg | DELAYED_RELEASE_CAPSULE | Freq: Two times a day (BID) | ORAL | 5 refills | Status: DC

## 2022-11-26 NOTE — Progress Notes (Signed)
Subjective:    Patient ID: Autumn Davis, female    DOB: 12-26-1985, 37 y.o.   MRN: [id number]   Chief Complaint: medical management of chronic issues     HPI:  Autumn Davis is a 37 y.o. who identifies as a female who was assigned female at birth.   Social history: Lives with: parents and her son Work history: disability   Comes in today for follow up of the following chronic medical issues:  1. Mixed hyperlipidemia Does not really watch diet. No dedicated exercise. Lab Results  Component Value Date   CHOL 187 05/22/2022   HDL 58 05/22/2022   LDLCALC 96 05/22/2022   TRIG 194 (H) 05/22/2022   CHOLHDL 3.2 05/22/2022     2. Thyroid disease No issus that she is aware of Lab Results  Component Value Date   TSH 1.780 11/25/2021     3. Gastroesophageal reflux disease without esophagitis Uses pepid when needed. Takes omeprazole daiy  4. Esophageal dysphagia No issues with swallowing as of late  5. Irritable bowel syndrome with constipation Takes elavil and that seems to keep her colon "Calm"  6. Tachycardia, paroxysmal (HCC) No recent episodes. Is on metoprolol daily  7. Inflammatory autoimmune disorder (HCC) 8. Systemic lupus erythematosus, unspecified SLE type, unspecified organ involvement status [facility]) She sees specialist about every 4 months. She considers herself about the same. No better and no worse. They still are not sure exactly what is wrong with her. Lupus has come up but not defiant diagnosis  9. Fibromyalgia 10. Muscle spasticity Has all over body pain with occasional muscle spasms. She sees pain  management monthly. She is on pain meds as well as neuronton daily. Alson on low dose prednisone.  11. Seizures (HCC) No recent seizure activity. Still on diamox  12. Urinary frequency Takes myrbetriq daily and is doing well. She alsotakes flomax daiy.  13. Other osteoporosis without current pathological fracture Last dexascan was  done on in 2017. Dr Rana Snare repeated dexascan and it has worsened. They are sending her to specialist.  14. Recurrent major depressive disorder, in full remission (HCC) Is currently not on anti depressant    11/26/2022    4:14 PM 07/28/2022   11:31 AM 05/22/2022   12:20 PM  Depression screen PHQ 2/9  Decreased Interest 1 0 1  Down, Depressed, Hopeless 0 0 0  PHQ - 2 Score 1 0 1  Altered sleeping 1  1  Tired, decreased energy 1  1  Change in appetite 1  0  Feeling bad or failure about yourself  0  0  Trouble concentrating 0  0  Moving slowly or fidgety/restless 0  0  Suicidal thoughts 0  0  PHQ-9 Score 4  3  Difficult doing work/chores Somewhat difficult  Not difficult at all     15. Vitamin D deficiency Is on a vitamin d supplement  16. BMI 33.0-33.9,adult Weight is up 7lbs Wt Readings from Last 3 Encounters:  11/26/22 216 lb (98 kg)  09/29/22 209 lb 9.6 oz (95.1 kg)  09/15/22 200 lb (90.7 kg)   BMI Readings from Last 3 Encounters:  11/26/22 32.84 kg/m  09/29/22 31.87 kg/m  09/15/22 30.41 kg/m     New complaints: None today  Allergies  Allergen Reactions   Ciprofloxacin Other (See Comments)    Muscle weakness and numbness Other reaction(s): Not available   Compazine Other (See Comments)    hallucinations   Prochlorperazine Other (See Comments)  and Rash    Pt states it makes her feel like things are crawling on her Other reaction(s): Not available   Outpatient Encounter Medications as of 11/26/2022  Medication Sig   acetaZOLAMIDE (DIAMOX) 250 MG tablet TAKE 1 TABLET EVERY MORNING, TAKE 1 TABLET AT 2PM, AND TAKE 2 TABLETSAT BEDTIME   albuterol (VENTOLIN HFA) 108 (90 Base) MCG/ACT inhaler Inhale 2 puffs into the lungs every 4 (four) hours as needed for wheezing or shortness of breath.   amitriptyline (ELAVIL) 75 MG tablet Take 1 tablet (75 mg total) by mouth at bedtime.   Ascorbic Acid (VITAMIN C ADULT GUMMIES PO) Take 2 each by mouth daily.   atorvastatin  (LIPITOR) 40 MG tablet TAKE ONE (1) TABLET EACH DAY   baclofen (LIORESAL) 20 MG tablet TAKE ONE TABLET FOUR TIMES DAILY   Beclomethasone Dipropionate (QNASL) 80 MCG/ACT AERS    budesonide-formoterol (SYMBICORT) 160-4.5 MCG/ACT inhaler    Calcium-Magnesium-Vitamin D (CALCIUM 1200+D3 PO) Take 1 tablet by mouth daily.   cetirizine (ZYRTEC) 10 MG tablet Take 1 tablet (10 mg total) by mouth 2 (two) times daily as needed for allergies (Can take an extra dose during flare ups.).   cromolyn (OPTICROM) 4 % ophthalmic solution SMARTSIG:In Eye(s)   cyanocobalamin (VITAMIN B12) 1000 MCG/ML injection INJECT IM EVERY 30 DAYS   cycloSPORINE (RESTASIS) 0.05 % ophthalmic emulsion Place 1 drop into both eyes 2 (two) times daily.   diclofenac (VOLTAREN) 75 MG EC tablet TAKE ONE TABLET BY MOUTH TWICE DAILY   diclofenac Sodium (VOLTAREN) 1 % GEL Apply 4 g topically 4 (four) times daily.   docusate sodium (COLACE) 100 MG capsule Take 100 mg by mouth 2 (two) times daily. PRN   EPINEPHrine 0.3 mg/0.3 mL IJ SOAJ injection Inject 0.3 mg into the muscle as needed for anaphylaxis.   famotidine (PEPCID) 40 MG tablet Take 1 tablet (40 mg total) by mouth at bedtime.   furosemide (LASIX) 20 MG tablet Take 1 tablet (20 mg total) by mouth daily.   gabapentin (NEURONTIN) 100 MG capsule    gabapentin (NEURONTIN) 600 MG tablet 1 po 3x a day and 2 at bedtime   ipratropium-albuterol (DUONEB) 0.5-2.5 (3) MG/3ML SOLN Take 3 mLs by nebulization every 4 (four) hours as needed.   L-Methylfolate 15 MG TABS Take 1 tablet (15 mg total) by mouth daily.   MAGNESIUM GLYCINATE PO Take 2 tablets by mouth daily.   methylPREDNISolone (MEDROL DOSEPAK) 4 MG TBPK tablet Use as directed   metoprolol succinate (TOPROL-XL) 25 MG 24 hr tablet Take 1 tablet (25 mg total) by mouth daily.   Metoprolol Succinate 25 MG CS24 Take 1 capsule every day by oral route.   mirabegron ER (MYRBETRIQ) 25 MG TB24 tablet Take 1 tablet (25 mg total) by mouth daily.    mometasone-formoterol (DULERA) 200-5 MCG/ACT AERO Inhale 2 puffs into the lungs in the morning and at bedtime.   montelukast (SINGULAIR) 10 MG tablet Take 1 tablet (10 mg total) by mouth at bedtime.   Multiple Vitamins-Minerals (ZINC PO) Take 1 tablet by mouth daily.   mupirocin ointment (BACTROBAN) 2 % APPLY A SMALL AMOUNT TO THE AFFECTED AREA BY TOPICAL ROUTE 3 TIMES PER DAY   nystatin (MYCOSTATIN) 100000 UNIT/ML suspension Use as directed 5 mLs (500,000 Units total) in the mouth or throat 4 (four) times daily.   nystatin cream (MYCOSTATIN) Apply 1 application topically 2 (two) times daily.   nystatin-triamcinolone ointment (MYCOLOG) Apply 1 application topically 2 (two) times daily.  Omega 3 1000 MG CAPS Take 1 capsule by mouth daily.   omeprazole (PRILOSEC) 40 MG capsule Take 1 capsule (40 mg total) by mouth 2 (two) times daily.   oxyCODONE-acetaminophen (PERCOCET) 7.5-325 MG tablet Take 1 tablet by mouth 5 (five) times daily as needed for moderate pain. No More Than 5 a day.   predniSONE (DELTASONE) 5 MG tablet Take 1 tablet (5 mg total) by mouth every morning.   Probiotic Product (PROBIOTIC-10 PO) Take by mouth.   promethazine (PHENERGAN) 12.5 MG tablet Take 1 tablet (12.5 mg total) by mouth every 8 (eight) hours as needed for nausea or vomiting.   sulfamethoxazole-trimethoprim (BACTRIM DS) 800-160 MG tablet Take 1 tablet every 12 hours by oral route.   tamsulosin (FLOMAX) 0.4 MG CAPS capsule TAKE ONE (1) CAPSULE EACH DAY   Vitamin D, Ergocalciferol, (DRISDOL) 1.25 MG (50000 UNIT) CAPS capsule TAKE 1 CAPSULE BY MOUTH EVERY 7 DAYS   vitamin E 180 MG (400 UNITS) capsule Take 1,000 Units by mouth daily.    Facility-Administered Encounter Medications as of 11/26/2022  Medication   betamethasone acetate-betamethasone sodium phosphate (CELESTONE) injection 3 mg    Past Surgical History:  Procedure Laterality Date   70 HOUR PH STUDY N/A 10/29/2015   Procedure: 24 HOUR PH STUDY;  Surgeon:  Iva Boop, MD;  Location: [facility];  Service: Endoscopy;  Laterality: N/A;   COLONOSCOPY     ESOPHAGEAL MANOMETRY N/A 10/29/2015   Procedure: ESOPHAGEAL MANOMETRY (EM);  Surgeon: Iva Boop, MD;  Location: [facility];  Service: Endoscopy;  Laterality: N/A;   KNEE SURGERY  2002/2003   bil   RECTAL SURGERY  correction of prolapse   2010    Family History  Problem Relation Age of Onset   Thyroid disease Mother    Colon polyps Father    Lung cancer Maternal Grandmother    Cancer Paternal Grandmother        colon/pancreatiec/lymphoma   Irritable bowel syndrome Brother    AAA (abdominal aortic aneurysm) Neg Hx       Controlled substance contract: sees pain management     Review of Systems  Constitutional:  Negative for diaphoresis.  Eyes:  Negative for pain.  Respiratory:  Negative for shortness of breath.   Cardiovascular:  Negative for chest pain, palpitations and leg swelling.  Gastrointestinal:  Negative for abdominal pain.  Endocrine: Negative for polydipsia.  Skin:  Negative for rash.  Neurological:  Negative for dizziness, weakness and headaches.  Hematological:  Does not bruise/bleed easily.  All other systems reviewed and are negative.      Objective:   Physical Exam Vitals and nursing note reviewed.  Constitutional:      General: She is not in acute distress.    Appearance: Normal appearance. She is well-developed.  HENT:     Head: Normocephalic.     Right Ear: Tympanic membrane normal.     Left Ear: Tympanic membrane normal.     Nose: Nose normal.     Mouth/Throat:     Mouth: Mucous membranes are moist.  Eyes:     Pupils: Pupils are equal, round, and reactive to light.  Neck:     Vascular: No carotid bruit or JVD.  Cardiovascular:     Rate and Rhythm: Normal rate and regular rhythm.     Heart sounds: Normal heart sounds.  Pulmonary:     Effort: Pulmonary effort is normal. No respiratory distress.     Breath sounds: Normal breath  sounds.  No wheezing or rales.  Chest:     Chest wall: No tenderness.  Abdominal:     General: Bowel sounds are normal. There is no distension or abdominal bruit.     Palpations: Abdomen is soft. There is no hepatomegaly, splenomegaly, mass or pulsatile mass.     Tenderness: There is no abdominal tenderness.  Musculoskeletal:        General: Normal range of motion.     Cervical back: Normal range of motion and neck supple.     Right lower leg: No edema.     Left lower leg: No edema.     Comments: Gait slow and steady  Lymphadenopathy:     Cervical: No cervical adenopathy.  Skin:    General: Skin is warm and dry.  Neurological:     Mental Status: She is alert and oriented to person, place, and time.     Deep Tendon Reflexes: Reflexes are normal and symmetric.  Psychiatric:        Behavior: Behavior normal.        Thought Content: Thought content normal.        Judgment: Judgment normal.     BP 131/87   Pulse 95   Temp 97.9 F (36.6 C) (Oral)   Resp 20   Ht 5\' 8"  (1.727 m)   Wt 216 lb (98 kg)   SpO2 99%   BMI 32.84 kg/m        Assessment & Plan:   Autumn Davis comes in today with chief complaint of Medical Management of Chronic Issues   Diagnosis and orders addressed:  1. Mixed hyperlipidemia Low fat diet - CBC with Differential/Platelet - CMP14+EGFR - Lipid panel - atorvastatin (LIPITOR) 40 MG tablet; TAKE ONE (1) TABLET EACH DAY  Dispense: 90 tablet; Refill: 1  2. Thyroid disease Labs oending - Thyroid Panel With TSH  3. Gastroesophageal reflux disease without esophagitis Avoid spicy foods Do not eat 2 hours prior to bedtime  4. Esophageal dysphagia Chew food well  5. Irritable bowel syndrome with constipation - amitriptyline (ELAVIL) 75 MG tablet; Take 1 tablet (75 mg total) by mouth at bedtime.  Dispense: 90 tablet; Refill: 1 - baclofen (LIORESAL) 20 MG tablet; TAKE ONE TABLET FOUR TIMES DAILY  Dispense: 360 each; Refill: 1  6.  Tachycardia, paroxysmal (HCC) Keep diary of any tachycardic episodes - metoprolol succinate (TOPROL-XL) 25 MG 24 hr tablet; Take 1 tablet (25 mg total) by mouth daily.  Dispense: 90 tablet; Refill: 1  7. Inflammatory autoimmune disorder (HCC) - gabapentin (NEURONTIN) 600 MG tablet; 1 po 3x a day and 2 at bedtime  Dispense: 450 tablet; Refill: 1  8. Systemic lupus erythematosus, unspecified SLE type, unspecified organ involvement status (HCC) Keep follow up with specialist  9. Fibromyalgia Keep follow up with painmanagement  10. Muscle spasticity   11. Seizures (HCC) Report and seizures - acetaZOLAMIDE (DIAMOX) 250 MG tablet; TAKE 1 TABLET EVERY MORNING, TAKE 1 TABLET AT 2PM, AND TAKE 2 TABLETSAT BEDTIME  Dispense: 360 tablet; Refill: 1  12. Urinary frequency - tamsulosin (FLOMAX) 0.4 MG CAPS capsule; Take 1 capsule (0.4 mg total) by mouth daily after breakfast.  Dispense: 90 capsule; Refill: 1 - mirabegron ER (MYRBETRIQ) 25 MG TB24 tablet; Take 1 tablet (25 mg total) by mouth daily.  Dispense: 30 tablet; Refill: 11  13. Other osteoporosis without current pathological fracture Keep appointment with specialist  14. Recurrent major depressive disorder, in full remission (HCC)  15. Vitamin D deficiency  Continue vitamin d supplement  16. BMI 33.0-33.9,adult Discussed diet and exercise for person with BMI >25 Will recheck weight in 3-6 months   17. Peripheral edema Elevate legs when sitting - furosemide (LASIX) 20 MG tablet; Take 1 tablet (20 mg total) by mouth daily.  Dispense: 90 tablet; Refill: 1   Labs pending Health Maintenance reviewed Diet and exercise encouraged  Follow up plan: 6 months   Mary-Margaret Daphine Deutscher, FNP

## 2022-11-26 NOTE — Patient Instructions (Signed)

## 2022-11-27 LAB — CMP14+EGFR
ALT: 19 IU/L (ref 0–32)
AST: 13 IU/L (ref 0–40)
Albumin/Globulin Ratio: 1.8 (ref 1.2–2.2)
Albumin: 4.3 g/dL (ref 3.9–4.9)
Alkaline Phosphatase: 97 IU/L (ref 44–121)
BUN/Creatinine Ratio: 12 (ref 9–23)
BUN: 9 mg/dL (ref 6–20)
Bilirubin Total: <0.2 mg/dL (ref 0.0–1.2)
CO2: 22 mmol/L (ref 20–29)
Calcium: 9.2 mg/dL (ref 8.7–10.2)
Chloride: 107 mmol/L — ABNORMAL HIGH (ref 96–106)
Creatinine, Ser: 0.76 mg/dL (ref 0.57–1.00)
Globulin, Total: 2.4 g/dL (ref 1.5–4.5)
Glucose: 100 mg/dL — ABNORMAL HIGH (ref 70–99)
Potassium: 3.9 mmol/L (ref 3.5–5.2)
Sodium: 140 mmol/L (ref 134–144)
Total Protein: 6.7 g/dL (ref 6.0–8.5)
eGFR: 103 mL/min/{1.73_m2} (ref >59)

## 2022-11-27 LAB — CBC WITH DIFFERENTIAL/PLATELET
Basophils Absolute: 0.1 10*3/uL (ref 0.0–0.2)
Basos: 1 %
EOS (ABSOLUTE): 0.1 10*3/uL (ref 0.0–0.4)
Eos: 1 %
Hematocrit: 36.3 % (ref 34.0–46.6)
Hemoglobin: 11.9 g/dL (ref 11.1–15.9)
Immature Grans (Abs): 0 10*3/uL (ref 0.0–0.1)
Immature Granulocytes: 0 %
Lymphocytes Absolute: 1.9 10*3/uL (ref 0.7–3.1)
Lymphs: 19 %
MCH: 29.5 pg (ref 26.6–33.0)
MCHC: 32.8 g/dL (ref 31.5–35.7)
MCV: 90 fL (ref 79–97)
Monocytes Absolute: 0.6 10*3/uL (ref 0.1–0.9)
Monocytes: 6 %
Neutrophils Absolute: 7.2 10*3/uL — ABNORMAL HIGH (ref 1.4–7.0)
Neutrophils: 73 %
Platelets: 301 10*3/uL (ref 150–450)
RBC: 4.04 x10E6/uL (ref 3.77–5.28)
RDW: 13.1 % (ref 11.7–15.4)
WBC: 9.9 10*3/uL (ref 3.4–10.8)

## 2022-11-27 LAB — LIPID PANEL
Chol/HDL Ratio: 3.1 ratio (ref 0.0–4.4)
Cholesterol, Total: 225 mg/dL — ABNORMAL HIGH (ref 100–199)
HDL: 72 mg/dL (ref >39)
LDL Chol Calc (NIH): 112 mg/dL — ABNORMAL HIGH (ref 0–99)
Triglycerides: 239 mg/dL — ABNORMAL HIGH (ref 0–149)
VLDL Cholesterol Cal: 41 mg/dL — ABNORMAL HIGH (ref 5–40)

## 2022-11-27 LAB — THYROID PANEL WITH TSH
Free Thyroxine Index: 1.7 (ref 1.2–4.9)
T3 Uptake Ratio: 25 % (ref 24–39)
T4, Total: 6.6 ug/dL (ref 4.5–12.0)
TSH: 1.38 u[IU]/mL (ref 0.450–4.500)

## 2022-11-28 DIAGNOSIS — M898X9 Other specified disorders of bone, unspecified site: Secondary | ICD-10-CM | POA: Diagnosis not present

## 2022-11-28 DIAGNOSIS — Z7189 Other specified counseling: Secondary | ICD-10-CM | POA: Diagnosis not present

## 2022-11-28 DIAGNOSIS — M810 Age-related osteoporosis without current pathological fracture: Secondary | ICD-10-CM | POA: Diagnosis not present

## 2022-12-01 ENCOUNTER — Encounter: Payer: Medicare Other | Admitting: Registered Nurse

## 2022-12-01 DIAGNOSIS — M810 Age-related osteoporosis without current pathological fracture: Secondary | ICD-10-CM | POA: Diagnosis not present

## 2022-12-08 ENCOUNTER — Encounter: Payer: Medicare Other | Attending: Registered Nurse | Admitting: Registered Nurse

## 2022-12-08 ENCOUNTER — Encounter: Payer: Self-pay | Admitting: Registered Nurse

## 2022-12-08 VITALS — BP 119/84 | Ht 68.0 in | Wt 215.0 lb

## 2022-12-08 DIAGNOSIS — Z79891 Long term (current) use of opiate analgesic: Secondary | ICD-10-CM | POA: Diagnosis not present

## 2022-12-08 DIAGNOSIS — M2550 Pain in unspecified joint: Secondary | ICD-10-CM | POA: Insufficient documentation

## 2022-12-08 DIAGNOSIS — Z5181 Encounter for therapeutic drug level monitoring: Secondary | ICD-10-CM | POA: Insufficient documentation

## 2022-12-08 DIAGNOSIS — M797 Fibromyalgia: Secondary | ICD-10-CM | POA: Insufficient documentation

## 2022-12-08 DIAGNOSIS — G609 Hereditary and idiopathic neuropathy, unspecified: Secondary | ICD-10-CM | POA: Diagnosis present

## 2022-12-08 DIAGNOSIS — M62838 Other muscle spasm: Secondary | ICD-10-CM | POA: Diagnosis not present

## 2022-12-08 DIAGNOSIS — M546 Pain in thoracic spine: Secondary | ICD-10-CM | POA: Insufficient documentation

## 2022-12-08 DIAGNOSIS — R569 Unspecified convulsions: Secondary | ICD-10-CM | POA: Diagnosis not present

## 2022-12-08 DIAGNOSIS — M7061 Trochanteric bursitis, right hip: Secondary | ICD-10-CM | POA: Insufficient documentation

## 2022-12-08 DIAGNOSIS — M7062 Trochanteric bursitis, left hip: Secondary | ICD-10-CM | POA: Insufficient documentation

## 2022-12-08 DIAGNOSIS — G894 Chronic pain syndrome: Secondary | ICD-10-CM | POA: Diagnosis not present

## 2022-12-08 DIAGNOSIS — G8929 Other chronic pain: Secondary | ICD-10-CM | POA: Insufficient documentation

## 2022-12-08 MED ORDER — OXYCODONE-ACETAMINOPHEN 7.5-325 MG PO TABS: 1.0000 | ORAL_TABLET | Freq: Every day | ORAL | 0 refills | Status: DC | PRN

## 2022-12-08 NOTE — Progress Notes (Signed)
Subjective:    Patient ID: Autumn Davis, female    DOB: 1986-02-14, 37 y.o.   MRN: [id number]  HPI: Autumn Davis is a 37 y.o. female who returns for follow up appointment for chronic pain and medication refill. She states her pain is located in her bilateral hands and bilateral feet with tingling and burning. She also reports mid- back and bilateral hip pain. She rates her pain 4. Her current exercise regime is walking and performing stretching exercises.  Autumn Davis Morphine equivalent is 48.75 MME.   Last Oral Swab was Performed on 07/28/2022, it was consistent.      Pain Inventory Average Pain 6 Pain Right Now 4 My pain is constant, sharp, burning, stabbing, and tingling  In the last 24 hours, has pain interfered with the following? General activity 7 Relation with others 7 Enjoyment of life 5 What TIME of day is your pain at its worst? evening and night Sleep (in general) Poor  Pain is worse with: walking, bending, sitting, inactivity, standing, and unsure Pain improves with: rest, heat/ice, therapy/exercise, pacing activities, medication, and TENS Relief from Meds: 9  Family History  Problem Relation Age of Onset   Thyroid disease Mother    Colon polyps Father    Lung cancer Maternal Grandmother    Cancer Paternal Grandmother        colon/pancreatiec/lymphoma   Irritable bowel syndrome Brother    AAA (abdominal aortic aneurysm) Neg Hx    Social History   Socioeconomic History   Marital status: Legally Separated    Spouse name: Not on file   Number of children: 1   Years of education: Not on file   Highest education level: Not on file  Occupational History   Occupation: disabled    Employer: [employer]  Tobacco Use   Smoking status: Never   Smokeless tobacco: Never  Vaping Use   Vaping Use: Never used  Substance and Sexual Activity   Alcohol use: No    Alcohol/week: 0.0 standard drinks of alcohol   Drug use: No    Sexual activity: Not on file  Other Topics Concern   Not on file  Social History Narrative   She lives alone with her 67 year old son - right now - they are living with her parents right now   Social Determinants of Health   Financial Resource Strain: Low Risk  (05/01/2022)   Overall Financial Resource Strain (CARDIA)    Difficulty of Paying Living Expenses: Not hard at all  Food Insecurity: No Food Insecurity (05/01/2022)   Hunger Vital Sign    Worried About Running Out of Food in the Last Year: Never true    Ran Out of Food in the Last Year: Never true  Transportation Needs: No Transportation Needs (05/01/2022)   PRAPARE - Administrator, Civil Service (Medical): No    Lack of Transportation (Non-Medical): No  Physical Activity: Insufficiently Active (05/01/2022)   Exercise Vital Sign    Days of Exercise per Week: 7 days    Minutes of Exercise per Session: 20 min  Stress: No Stress Concern Present (05/01/2022)   [employer] of Occupational Health - Occupational Stress Questionnaire    Feeling of Stress : Only a little  Social Connections: Moderately Integrated (05/01/2022)   Social Connection and Isolation Panel [NHANES]    Frequency of Communication with Friends and Family: More than three times a week    Frequency of Social  Gatherings with Friends and Family: More than three times a week    Attends Religious Services: More than 4 times per year    Active Member of Clubs or Organizations: Yes    Attends Engineer, structural: More than 4 times per year    Marital Status: Separated   Past Surgical History:  Procedure Laterality Date   56 HOUR PH STUDY N/A 10/29/2015   Procedure: 24 HOUR PH STUDY;  Surgeon: Iva Boop, MD;  Location: [facility];  Service: Endoscopy;  Laterality: N/A;   COLONOSCOPY     ESOPHAGEAL MANOMETRY N/A 10/29/2015   Procedure: ESOPHAGEAL MANOMETRY (EM);  Surgeon: Iva Boop, MD;  Location: [facility];  Service: Endoscopy;   Laterality: N/A;   KNEE SURGERY  2002/2003   bil   RECTAL SURGERY  correction of prolapse   2010   Past Surgical History:  Procedure Laterality Date   27 HOUR PH STUDY N/A 10/29/2015   Procedure: 24 HOUR PH STUDY;  Surgeon: Iva Boop, MD;  Location: [facility];  Service: Endoscopy;  Laterality: N/A;   COLONOSCOPY     ESOPHAGEAL MANOMETRY N/A 10/29/2015   Procedure: ESOPHAGEAL MANOMETRY (EM);  Surgeon: Iva Boop, MD;  Location: [facility];  Service: Endoscopy;  Laterality: N/A;   KNEE SURGERY  2002/2003   bil   RECTAL SURGERY  correction of prolapse   2010   Past Medical History:  Diagnosis Date   Allergy    Arthritis    HANDS,HIPS,KNEES   Asthma    Bronchitis, chronic/intermittent 01/22/2012   Depression 01/22/2012   GERD (gastroesophageal reflux disease)    Hyperlipidemia    IBS (irritable bowel syndrome) 01/22/2012   Lupus (HCC)    Neuromuscular disorder (HCC)    Osteoporosis    Seizures (HCC)    SOB (shortness of breath)    Thyroid disease    There were no vitals taken for this visit.  Opioid Risk Score:   Fall Risk Score:  `1  Depression screen PHQ 2/9     11/26/2022    4:14 PM 07/28/2022   11:31 AM 05/22/2022   12:20 PM 05/01/2022   10:00 AM 02/24/2022    8:32 AM 12/10/2021   11:32 AM 11/25/2021    1:58 PM  Depression screen PHQ 2/9  Decreased Interest 1 0 1 1 0 0 1  Down, Depressed, Hopeless 0 0 0 1 0 0 0  PHQ - 2 Score 1 0 1 2 0 0 1  Altered sleeping 1  1 1   1   Tired, decreased energy 1  1 1   1   Change in appetite 1  0 1   1  Feeling bad or failure about yourself  0  0 0   0  Trouble concentrating 0  0 0   0  Moving slowly or fidgety/restless 0  0 0   0  Suicidal thoughts 0  0 0   0  PHQ-9 Score 4  3 5   4   Difficult doing work/chores Somewhat difficult  Not difficult at all Somewhat difficult   Somewhat difficult    Review of Systems  Musculoskeletal:  Positive for back pain and gait problem.      Objective:   Physical Exam Vitals  and nursing note reviewed.  Constitutional:      Appearance: Normal appearance.  Cardiovascular:     Rate and Rhythm: Normal rate and regular rhythm.     Pulses: Normal pulses.  Heart sounds: Normal heart sounds.  Pulmonary:     Effort: Pulmonary effort is normal.     Breath sounds: Normal breath sounds.  Musculoskeletal:     Cervical back: Normal range of motion and neck supple.     Comments: Normal Muscle Bulk and Muscle Testing Reveals:  Upper Extremities: Full ROM and Muscle Strength 5/5 Thoracic Paraspinal Tenderness: T-10- T-12  Lower Extremities: Full ROM and Muscle Strength 5/5 Arises from Table with ease Narrow Based  Gait     Skin:    General: Skin is warm and dry.  Neurological:     Mental Status: She is alert and oriented to person, place, and time.  Psychiatric:        Mood and Affect: Mood normal.        Behavior: Behavior normal.         Assessment & Plan:  1. Chronic seizure disorder: No seizure's. Continue current medication regimen with  Gabapentin. Neurology Following. 12/08/2022. 2. Chronic muscle spasms, weakness with associated pain disorder: Continue current medication regimen with Baclofen. Continue with Exercise regime. 12/08/2022. 3. Chronic dysphagia: No complaints today. Continue to monitor. GI Following. 12/08/2022. 4. Anxiety with depression : No complaints today. Stable. Continue current medication regimen with  Elavil. 12/08/2022. 5. Fibromyalgia/ Rib Pain/Chronic Pain: Continue Diclofenac: Continue with exercise and heat Therapy. 12/08/2022. Refilled:  oxyCODONE 7.5/325mg  one tablet 5 times  daily as needed  #140. Second script sent for the following month. Continue with slow weaning.   We will continue the opioid monitoring program, this consists of regular clinic visits, examinations, urine drug screen, pill counts as well as use of [place] Controlled Substance Reporting system. A 12 month History has been reviewed on the [place] Controlled Substance Reporting System 12/08/2022.  6. Peripheral Neuropathy: Continue current medication regimen with  Gabapentin: 09/29/2022.     F/U in 2 months

## 2022-12-09 ENCOUNTER — Encounter: Payer: Self-pay | Admitting: Registered Nurse

## 2022-12-22 ENCOUNTER — Other Ambulatory Visit: Payer: Self-pay | Admitting: Nurse Practitioner

## 2022-12-25 ENCOUNTER — Other Ambulatory Visit: Payer: Self-pay | Admitting: Nurse Practitioner

## 2023-01-09 DIAGNOSIS — M810 Age-related osteoporosis without current pathological fracture: Secondary | ICD-10-CM | POA: Diagnosis not present

## 2023-02-02 ENCOUNTER — Encounter: Payer: Medicare Other | Admitting: Registered Nurse

## 2023-02-16 ENCOUNTER — Encounter: Payer: Medicare Other | Attending: Registered Nurse | Admitting: Registered Nurse

## 2023-02-16 VITALS — BP 113/78 | HR 77 | Ht 68.0 in | Wt 224.2 lb

## 2023-02-16 DIAGNOSIS — M62838 Other muscle spasm: Secondary | ICD-10-CM | POA: Diagnosis not present

## 2023-02-16 DIAGNOSIS — M7062 Trochanteric bursitis, left hip: Secondary | ICD-10-CM | POA: Diagnosis not present

## 2023-02-16 DIAGNOSIS — G894 Chronic pain syndrome: Secondary | ICD-10-CM | POA: Insufficient documentation

## 2023-02-16 DIAGNOSIS — M7061 Trochanteric bursitis, right hip: Secondary | ICD-10-CM | POA: Insufficient documentation

## 2023-02-16 DIAGNOSIS — Z5181 Encounter for therapeutic drug level monitoring: Secondary | ICD-10-CM | POA: Diagnosis not present

## 2023-02-16 DIAGNOSIS — G609 Hereditary and idiopathic neuropathy, unspecified: Secondary | ICD-10-CM | POA: Diagnosis present

## 2023-02-16 DIAGNOSIS — M546 Pain in thoracic spine: Secondary | ICD-10-CM | POA: Insufficient documentation

## 2023-02-16 DIAGNOSIS — Z79891 Long term (current) use of opiate analgesic: Secondary | ICD-10-CM | POA: Insufficient documentation

## 2023-02-16 DIAGNOSIS — G8929 Other chronic pain: Secondary | ICD-10-CM | POA: Insufficient documentation

## 2023-02-16 DIAGNOSIS — M797 Fibromyalgia: Secondary | ICD-10-CM | POA: Insufficient documentation

## 2023-02-16 DIAGNOSIS — M2550 Pain in unspecified joint: Secondary | ICD-10-CM | POA: Diagnosis not present

## 2023-02-16 MED ORDER — OXYCODONE-ACETAMINOPHEN 7.5-325 MG PO TABS
1.0000 | ORAL_TABLET | Freq: Every day | ORAL | 0 refills | Status: DC | PRN
Start: 2023-02-16 — End: 2023-04-24

## 2023-02-16 MED ORDER — OXYCODONE-ACETAMINOPHEN 7.5-325 MG PO TABS
1.0000 | ORAL_TABLET | Freq: Every day | ORAL | 0 refills | Status: DC | PRN
Start: 2023-02-16 — End: 2023-02-16

## 2023-02-16 NOTE — Progress Notes (Signed)
Subjective:    Patient ID: Autumn Davis, female    DOB: 1986/02/28, 37 y.o.   MRN: [id number]  HPI: Autumn Davis is a 37 y.o. female who returns for follow up appointment for chronic pain and medication refill. She states her pain is located in her mid- back, bilateral hips and generalized joint pain. She also reports tingling and burning into her bilateral lower extremities.  She rates her pain 6. Her current exercise regime is walking and performing stretching exercises.  [name] Morphine equivalent is 52.50 MME.   Oral Swab was Performed today.    Pain Inventory Average Pain 8 Pain Right Now 6 My pain is constant, sharp, burning, stabbing, tingling, and aching  In the last 24 hours, has pain interfered with the following? General activity 7 Relation with others 8 Enjoyment of life 8 What TIME of day is your pain at its worst? evening and night Sleep (in general) Poor  Pain is worse with: walking, bending, sitting, inactivity, and some activites Pain improves with: rest, heat/ice, therapy/exercise, pacing activities, medication, and TENS Relief from Meds: 4  Family History  Problem Relation Age of Onset   Thyroid disease Mother    Colon polyps Father    Lung cancer Maternal Grandmother    Cancer Paternal Grandmother        colon/pancreatiec/lymphoma   Irritable bowel syndrome Brother    AAA (abdominal aortic aneurysm) Neg Hx    Social History   Socioeconomic History   Marital status: Legally Separated    Spouse name: Not on file   Number of children: 1   Years of education: Not on file   Highest education level: Not on file  Occupational History   Occupation: disabled    Employer: [employer]  Tobacco Use   Smoking status: Never   Smokeless tobacco: Never  Vaping Use   Vaping Use: Never used  Substance and Sexual Activity   Alcohol use: No    Alcohol/week: 0.0 standard drinks of alcohol   Drug use: No   Sexual activity: Not  on file  Other Topics Concern   Not on file  Social History Narrative   She lives alone with her 52 year old son - right now - they are living with her parents right now   Social Determinants of Health   Financial Resource Strain: Low Risk  (05/01/2022)   Overall Financial Resource Strain (CARDIA)    Difficulty of Paying Living Expenses: Not hard at all  Food Insecurity: No Food Insecurity (05/01/2022)   Hunger Vital Sign    Worried About Running Out of Food in the Last Year: Never true    Ran Out of Food in the Last Year: Never true  Transportation Needs: No Transportation Needs (05/01/2022)   PRAPARE - Administrator, Civil Service (Medical): No    Lack of Transportation (Non-Medical): No  Physical Activity: Insufficiently Active (05/01/2022)   Exercise Vital Sign    Days of Exercise per Week: 7 days    Minutes of Exercise per Session: 20 min  Stress: No Stress Concern Present (05/01/2022)   [employer] of Occupational Health - Occupational Stress Questionnaire    Feeling of Stress : Only a little  Social Connections: Moderately Integrated (05/01/2022)   Social Connection and Isolation Panel [NHANES]    Frequency of Communication with Friends and Family: More than three times a week    Frequency of Social Gatherings with Friends and Family:  More than three times a week    Attends Religious Services: More than 4 times per year    Active Member of Clubs or Organizations: Yes    Attends Banker Meetings: More than 4 times per year    Marital Status: Separated   Past Surgical History:  Procedure Laterality Date   36 HOUR PH STUDY N/A 10/29/2015   Procedure: 24 HOUR PH STUDY;  Surgeon: Iva Boop, MD;  Location: [facility];  Service: Endoscopy;  Laterality: N/A;   COLONOSCOPY     ESOPHAGEAL MANOMETRY N/A 10/29/2015   Procedure: ESOPHAGEAL MANOMETRY (EM);  Surgeon: Iva Boop, MD;  Location: [facility];  Service: Endoscopy;  Laterality: N/A;    KNEE SURGERY  2002/2003   bil   RECTAL SURGERY  correction of prolapse   2010   Past Surgical History:  Procedure Laterality Date   13 HOUR PH STUDY N/A 10/29/2015   Procedure: 24 HOUR PH STUDY;  Surgeon: Iva Boop, MD;  Location: [facility];  Service: Endoscopy;  Laterality: N/A;   COLONOSCOPY     ESOPHAGEAL MANOMETRY N/A 10/29/2015   Procedure: ESOPHAGEAL MANOMETRY (EM);  Surgeon: Iva Boop, MD;  Location: [facility];  Service: Endoscopy;  Laterality: N/A;   KNEE SURGERY  2002/2003   bil   RECTAL SURGERY  correction of prolapse   2010   Past Medical History:  Diagnosis Date   Allergy    Arthritis    HANDS,HIPS,KNEES   Asthma    Bronchitis, chronic/intermittent 01/22/2012   Depression 01/22/2012   GERD (gastroesophageal reflux disease)    Hyperlipidemia    IBS (irritable bowel syndrome) 01/22/2012   Lupus (HCC)    Neuromuscular disorder (HCC)    Osteoporosis    Seizures (HCC)    SOB (shortness of breath)    Thyroid disease    BP 113/78   Pulse 77   Ht 5\' 8"  (1.727 m)   Wt 224 lb 3.2 oz (101.7 kg)   SpO2 98%   BMI 34.09 kg/m   Opioid Risk Score:   Fall Risk Score:  `1  Depression screen [facility] 2/9     12/08/2022   11:19 AM 11/26/2022    4:14 PM 07/28/2022   11:31 AM 05/22/2022   12:20 PM 05/01/2022   10:00 AM 02/24/2022    8:32 AM 12/10/2021   11:32 AM  Depression screen PHQ 2/9  Decreased Interest 0 1 0 1 1 0 0  Down, Depressed, Hopeless 0 0 0 0 1 0 0  PHQ - 2 Score 0 1 0 1 2 0 0  Altered sleeping  1  1 1     Tired, decreased energy  1  1 1     Change in appetite  1  0 1    Feeling bad or failure about yourself   0  0 0    Trouble concentrating  0  0 0    Moving slowly or fidgety/restless  0  0 0    Suicidal thoughts  0  0 0    PHQ-9 Score  4  3 5     Difficult doing work/chores  Somewhat difficult  Not difficult at all Somewhat difficult       Review of Systems  Musculoskeletal:  Positive for back pain.       Bilateral hand pain Bilateral  lower leg pain  All other systems reviewed and are negative.     Objective:   Physical Exam Vitals and nursing note  reviewed.  Constitutional:      Appearance: Normal appearance.  Cardiovascular:     Rate and Rhythm: Normal rate and regular rhythm.     Pulses: Normal pulses.     Heart sounds: Normal heart sounds.  Pulmonary:     Effort: Pulmonary effort is normal.     Breath sounds: Normal breath sounds.  Musculoskeletal:     Cervical back: Normal range of motion and neck supple.     Comments: Normal Muscle Bulk and Muscle Testing Reveals:  Upper Extremities: Full ROM and Muscle Strength 5/5  Thoracic Paraspinal Tenderness: T-7-T-9 Bilateral Greater Trochanter Tenderness Lower Extremities: Full ROM and Muscle Strength 5/5 Arises from Table with ease Narrow Based  Gait     Skin:    General: Skin is warm and dry.  Neurological:     Mental Status: She is alert and oriented to person, place, and time.  Psychiatric:        Mood and Affect: Mood normal.        Behavior: Behavior normal.         Assessment & Plan:  1. Chronic seizure disorder: No seizure's. Continue current medication regimen with  Gabapentin. Neurology Following. 02/16/2023. 2. Chronic muscle spasms, weakness with associated pain disorder: Continue current medication regimen with Baclofen. Continue with Exercise regime. 02/16/2023. 3. Chronic dysphagia: No complaints today. Continue to monitor. GI Following. 02/16/2023. 4. Anxiety with depression : No complaints today. Stable. Continue current medication regimen with  Elavil. 02/16/2023. 5. Fibromyalgia/ Rib Pain/Chronic Pain: Continue Diclofenac: Continue with exercise and heat Therapy. 02/16/2023. Refilled:  oxyCODONE 7.5/325mg  one tablet 5 times  daily as needed  #140. Second script sent for the following month. Continue with slow weaning.   We will continue the opioid monitoring program, this consists of regular clinic visits, examinations, urine drug  screen, pill counts as well as use of [place] Controlled Substance Reporting system. A 12 month History has been reviewed on the [place] Controlled Substance Reporting System 02/16/2023.  6. Peripheral Neuropathy: Continue current medication regimen with  Gabapentin: 02/16/2023.     F/U in 2 months

## 2023-02-17 ENCOUNTER — Other Ambulatory Visit: Payer: Self-pay | Admitting: Nurse Practitioner

## 2023-02-17 DIAGNOSIS — K581 Irritable bowel syndrome with constipation: Secondary | ICD-10-CM

## 2023-02-19 LAB — DRUG TOX MONITOR 1 W/CONF, ORAL FLD
Amphetamines: NEGATIVE ng/mL (ref <10)
Barbiturates: NEGATIVE ng/mL (ref <10)
Benzodiazepines: NEGATIVE ng/mL (ref <0.50)
Buprenorphine: NEGATIVE ng/mL (ref <0.10)
Cocaine: NEGATIVE ng/mL (ref <5.0)
Codeine: NEGATIVE ng/mL (ref <2.5)
Dihydrocodeine: NEGATIVE ng/mL (ref <2.5)
Fentanyl: NEGATIVE ng/mL (ref <0.10)
Heroin Metabolite: NEGATIVE ng/mL (ref <1.0)
Hydrocodone: NEGATIVE ng/mL (ref <2.5)
Hydromorphone: NEGATIVE ng/mL (ref <2.5)
MARIJUANA: NEGATIVE ng/mL (ref <2.5)
MDMA: NEGATIVE ng/mL (ref <10)
Meprobamate: NEGATIVE ng/mL (ref <2.5)
Methadone: NEGATIVE ng/mL (ref <5.0)
Morphine: NEGATIVE ng/mL (ref <2.5)
Nicotine Metabolite: NEGATIVE ng/mL (ref <5.0)
Norhydrocodone: NEGATIVE ng/mL (ref <2.5)
Noroxycodone: 15.3 ng/mL — ABNORMAL HIGH (ref <2.5)
Opiates: POSITIVE ng/mL — AB (ref <2.5)
Oxycodone: 186.7 ng/mL — ABNORMAL HIGH (ref <2.5)
Oxymorphone: NEGATIVE ng/mL (ref <2.5)
Phencyclidine: NEGATIVE ng/mL (ref <10)
Tapentadol: NEGATIVE ng/mL (ref <5.0)
Tramadol: NEGATIVE ng/mL (ref <5.0)
Zolpidem: NEGATIVE ng/mL (ref <5.0)

## 2023-02-19 LAB — DRUG TOX ALC METAB W/CON, ORAL FLD: Alcohol Metabolite: NEGATIVE ng/mL (ref <25)

## 2023-02-21 ENCOUNTER — Encounter: Payer: Self-pay | Admitting: Registered Nurse

## 2023-02-25 ENCOUNTER — Other Ambulatory Visit: Payer: Self-pay | Admitting: Nurse Practitioner

## 2023-02-25 DIAGNOSIS — D8989 Other specified disorders involving the immune mechanism, not elsewhere classified: Secondary | ICD-10-CM

## 2023-03-04 ENCOUNTER — Telehealth: Payer: Self-pay | Admitting: *Deleted

## 2023-03-04 NOTE — Telephone Encounter (Signed)
Oral swab drug screen was consistent for prescribed medications.  ?

## 2023-03-10 ENCOUNTER — Ambulatory Visit: Payer: Medicare Other | Admitting: Allergy and Immunology

## 2023-03-18 ENCOUNTER — Other Ambulatory Visit: Payer: Self-pay | Admitting: Nurse Practitioner

## 2023-03-18 DIAGNOSIS — R600 Localized edema: Secondary | ICD-10-CM

## 2023-03-18 DIAGNOSIS — E782 Mixed hyperlipidemia: Secondary | ICD-10-CM

## 2023-04-15 ENCOUNTER — Telehealth: Payer: Medicare Other | Admitting: Physician Assistant

## 2023-04-15 DIAGNOSIS — R3989 Other symptoms and signs involving the genitourinary system: Secondary | ICD-10-CM | POA: Diagnosis not present

## 2023-04-15 MED ORDER — PHENAZOPYRIDINE HCL 100 MG PO TABS
100.0000 mg | ORAL_TABLET | Freq: Three times a day (TID) | ORAL | 0 refills | Status: AC | PRN
Start: 2023-04-15 — End: ?

## 2023-04-15 MED ORDER — SULFAMETHOXAZOLE-TRIMETHOPRIM 800-160 MG PO TABS
1.0000 | ORAL_TABLET | Freq: Two times a day (BID) | ORAL | 0 refills | Status: DC
Start: 2023-04-15 — End: 2023-06-02

## 2023-04-15 NOTE — Progress Notes (Signed)
E-Visit for Urinary Problems  We are sorry that you are not feeling well.  Here is how we plan to help!  Based on what you shared with me it looks like you most likely have a simple urinary tract infection.  A UTI (Urinary Tract Infection) is a bacterial infection of the bladder.  Most cases of urinary tract infections are simple to treat but a key part of your care is to encourage you to drink plenty of fluids and watch your symptoms carefully.  I have prescribed Bactrim DS One tablet twice a day for 5 days.  Your symptoms should gradually improve. Call us if the burning in your urine worsens, you develop worsening fever, back pain or pelvic pain or if your symptoms do not resolve after completing the antibiotic. I have also prescribed Pyridium 100mg  Take 1 tablet every 8 hours as needed for pain and bladder spasms. This can turn your urine a reddish-orange color.   Urinary tract infections can be prevented by drinking plenty of water to keep your body hydrated.  Also be sure when you wipe, wipe from front to back and don't hold it in!  If possible, empty your bladder every 4 hours.  HOME CARE Drink plenty of fluids Compete the full course of the antibiotics even if the symptoms resolve Remember, when you need to go.go. Holding in your urine can increase the likelihood of getting a UTI! GET HELP RIGHT AWAY IF: You cannot urinate You get a high fever Worsening back pain occurs You see blood in your urine You feel sick to your stomach or throw up You feel like you are going to pass out  MAKE SURE YOU  Understand these instructions. Will watch your condition. Will get help right away if you are not doing well or get worse.   Thank you for choosing an e-visit.  Your e-visit answers were reviewed by a board certified advanced clinical practitioner to complete your personal care plan. Depending upon the condition, your plan could have included both over the counter or prescription  medications.  Please review your pharmacy choice. Make sure the pharmacy is open so you can pick up prescription now. If there is a problem, you may contact your provider through [employer] and have the prescription routed to another pharmacy.  Your safety is important to [place]. If you have drug allergies check your prescription carefully.   For the next 24 hours you can use MyChart to ask questions about today's visit, request a non-urgent call back, or ask for a work or school excuse. You will get an email in the next two days asking about your experience. I hope that your e-visit has been valuable and will speed your recovery.  I have spent 5 minutes in review of e-visit questionnaire, review and updating patient chart, medical decision making and response to patient.   Margaretann Loveless, PA-C

## 2023-04-19 ENCOUNTER — Other Ambulatory Visit: Payer: Self-pay | Admitting: Nurse Practitioner

## 2023-04-20 ENCOUNTER — Encounter: Payer: Medicare Other | Admitting: Registered Nurse

## 2023-04-24 ENCOUNTER — Encounter: Payer: Self-pay | Admitting: Registered Nurse

## 2023-04-24 ENCOUNTER — Encounter: Payer: Medicare Other | Attending: Registered Nurse | Admitting: Registered Nurse

## 2023-04-24 VITALS — BP 128/85 | HR 76 | Ht 68.0 in | Wt 228.0 lb

## 2023-04-24 DIAGNOSIS — M7062 Trochanteric bursitis, left hip: Secondary | ICD-10-CM | POA: Diagnosis not present

## 2023-04-24 DIAGNOSIS — M25511 Pain in right shoulder: Secondary | ICD-10-CM | POA: Diagnosis not present

## 2023-04-24 DIAGNOSIS — G894 Chronic pain syndrome: Secondary | ICD-10-CM | POA: Diagnosis not present

## 2023-04-24 DIAGNOSIS — Z79891 Long term (current) use of opiate analgesic: Secondary | ICD-10-CM

## 2023-04-24 DIAGNOSIS — G8929 Other chronic pain: Secondary | ICD-10-CM | POA: Diagnosis not present

## 2023-04-24 DIAGNOSIS — M7061 Trochanteric bursitis, right hip: Secondary | ICD-10-CM

## 2023-04-24 DIAGNOSIS — G609 Hereditary and idiopathic neuropathy, unspecified: Secondary | ICD-10-CM | POA: Diagnosis present

## 2023-04-24 DIAGNOSIS — M546 Pain in thoracic spine: Secondary | ICD-10-CM | POA: Diagnosis not present

## 2023-04-24 DIAGNOSIS — M25512 Pain in left shoulder: Secondary | ICD-10-CM | POA: Diagnosis not present

## 2023-04-24 DIAGNOSIS — M797 Fibromyalgia: Secondary | ICD-10-CM

## 2023-04-24 DIAGNOSIS — M62838 Other muscle spasm: Secondary | ICD-10-CM

## 2023-04-24 DIAGNOSIS — M2550 Pain in unspecified joint: Secondary | ICD-10-CM

## 2023-04-24 DIAGNOSIS — Z5181 Encounter for therapeutic drug level monitoring: Secondary | ICD-10-CM

## 2023-04-24 MED ORDER — OXYCODONE-ACETAMINOPHEN 7.5-325 MG PO TABS
1.0000 | ORAL_TABLET | Freq: Every day | ORAL | 0 refills | Status: DC | PRN
Start: 2023-04-24 — End: 2023-07-17

## 2023-04-24 MED ORDER — OXYCODONE-ACETAMINOPHEN 7.5-325 MG PO TABS
1.0000 | ORAL_TABLET | Freq: Every day | ORAL | 0 refills | Status: DC | PRN
Start: 2023-04-24 — End: 2023-04-24

## 2023-04-24 NOTE — Progress Notes (Signed)
Subjective:    Patient ID: Autumn Davis, female    DOB: Sep 10, 1986, 37 y.o.   MRN: [id number]  HPI: Autumn Davis is a 37 y.o. female who returns for follow up appointment for chronic pain and medication refill. She states her pain is located in her bilateral shoulders L>R, mid- back pain and bilateral hip pain. Also reports occasionally tingling in her bilateral hands and bilateral feet. She rates her pain 5. Her current exercise regime is walking and performing stretching exercises.  Autumn Davis Morphine equivalent is 39.38  MME.   Last Oral Swab was Performed on 02/16/2023, it was consistent.     Pain Inventory Average Pain 7 Pain Right Now 5 My pain is constant, sharp, burning, stabbing, and tingling  In the last 24 hours, has pain interfered with the following? General activity 8 Relation with others 8 Enjoyment of life 6 What TIME of day is your pain at its worst? evening and night Sleep (in general) Poor  Pain is worse with: walking, bending, sitting, inactivity, standing, and some activites Pain improves with: rest, heat/ice, therapy/exercise, pacing activities, medication, and TENS Relief from Meds: 5  Family History  Problem Relation Age of Onset   Thyroid disease Mother    Colon polyps Father    Lung cancer Maternal Grandmother    Cancer Paternal Grandmother        colon/pancreatiec/lymphoma   Irritable bowel syndrome Brother    AAA (abdominal aortic aneurysm) Neg Hx    Social History   Socioeconomic History   Marital status: Legally Separated    Spouse name: Not on file   Number of children: 1   Years of education: Not on file   Highest education level: Not on file  Occupational History   Occupation: disabled    Employer: [employer]  Tobacco Use   Smoking status: Never   Smokeless tobacco: Never  Vaping Use   Vaping Use: Never used  Substance and Sexual Activity   Alcohol use: No    Alcohol/week: 0.0 standard drinks of  alcohol   Drug use: No   Sexual activity: Not on file  Other Topics Concern   Not on file  Social History Narrative   She lives alone with her 72 year old son - right now - they are living with her parents right now   Social Determinants of Health   Financial Resource Strain: Low Risk  (05/01/2022)   Overall Financial Resource Strain (CARDIA)    Difficulty of Paying Living Expenses: Not hard at all  Food Insecurity: No Food Insecurity (05/01/2022)   Hunger Vital Sign    Worried About Running Out of Food in the Last Year: Never true    Ran Out of Food in the Last Year: Never true  Transportation Needs: No Transportation Needs (05/01/2022)   PRAPARE - Administrator, Civil Service (Medical): No    Lack of Transportation (Non-Medical): No  Physical Activity: Insufficiently Active (05/01/2022)   Exercise Vital Sign    Days of Exercise per Week: 7 days    Minutes of Exercise per Session: 20 min  Stress: No Stress Concern Present (05/01/2022)   [employer] of Occupational Health - Occupational Stress Questionnaire    Feeling of Stress : Only a little  Social Connections: Moderately Integrated (05/01/2022)   Social Connection and Isolation Panel [NHANES]    Frequency of Communication with Friends and Family: More than three times a week  Frequency of Social Gatherings with Friends and Family: More than three times a week    Attends Religious Services: More than 4 times per year    Active Member of Clubs or Organizations: Yes    Attends Banker Meetings: More than 4 times per year    Marital Status: Separated   Past Surgical History:  Procedure Laterality Date   24 HOUR PH STUDY N/A 10/29/2015   Procedure: 24 HOUR PH STUDY;  Surgeon: Iva Boop, MD;  Location: [facility];  Service: Endoscopy;  Laterality: N/A;   COLONOSCOPY     ESOPHAGEAL MANOMETRY N/A 10/29/2015   Procedure: ESOPHAGEAL MANOMETRY (EM);  Surgeon: Iva Boop, MD;  Location: [facility];  Service: Endoscopy;  Laterality: N/A;   KNEE SURGERY  2002/2003   bil   RECTAL SURGERY  correction of prolapse   2010   Past Surgical History:  Procedure Laterality Date   28 HOUR PH STUDY N/A 10/29/2015   Procedure: 24 HOUR PH STUDY;  Surgeon: Iva Boop, MD;  Location: [facility];  Service: Endoscopy;  Laterality: N/A;   COLONOSCOPY     ESOPHAGEAL MANOMETRY N/A 10/29/2015   Procedure: ESOPHAGEAL MANOMETRY (EM);  Surgeon: Iva Boop, MD;  Location: [facility];  Service: Endoscopy;  Laterality: N/A;   KNEE SURGERY  2002/2003   bil   RECTAL SURGERY  correction of prolapse   2010   Past Medical History:  Diagnosis Date   Allergy    Arthritis    HANDS,HIPS,KNEES   Asthma    Bronchitis, chronic/intermittent 01/22/2012   Depression 01/22/2012   GERD (gastroesophageal reflux disease)    Hyperlipidemia    IBS (irritable bowel syndrome) 01/22/2012   Lupus (HCC)    Neuromuscular disorder (HCC)    Osteoporosis    Seizures (HCC)    SOB (shortness of breath)    Thyroid disease    There were no vitals taken for this visit.  Opioid Risk Score:   Fall Risk Score:  `1  Depression screen [facility] 2/9     12/08/2022   11:19 AM 11/26/2022    4:14 PM 07/28/2022   11:31 AM 05/22/2022   12:20 PM 05/01/2022   10:00 AM 02/24/2022    8:32 AM 12/10/2021   11:32 AM  Depression screen PHQ 2/9  Decreased Interest 0 1 0 1 1 0 0  Down, Depressed, Hopeless 0 0 0 0 1 0 0  PHQ - 2 Score 0 1 0 1 2 0 0  Altered sleeping  1  1 1     Tired, decreased energy  1  1 1     Change in appetite  1  0 1    Feeling bad or failure about yourself   0  0 0    Trouble concentrating  0  0 0    Moving slowly or fidgety/restless  0  0 0    Suicidal thoughts  0  0 0    PHQ-9 Score  4  3 5     Difficult doing work/chores  Somewhat difficult  Not difficult at all Somewhat difficult                                                     Review of Systems  Musculoskeletal:  Positive for back pain.  LT shoulder B/L foot leg pain B/L hand pain  All other systems reviewed and are negative.      Objective:   Physical Exam Vitals and nursing note reviewed.  Constitutional:      Appearance: Normal appearance.  Cardiovascular:     Rate and Rhythm: Normal rate and regular rhythm.     Pulses: Normal pulses.     Heart sounds: Normal heart sounds.  Pulmonary:     Effort: Pulmonary effort is normal.     Breath sounds: Normal breath sounds.  Musculoskeletal:     Cervical back: Normal range of motion and neck supple.     Comments: Normal Muscle Bulk and Muscle Testing Reveals:  Upper Extremities: Full ROM and Muscle Strength 5/5 Thoracic Paraspinal Tenderness: T-7-T-9 Bilateral Greater Trochanter Tenderness  Lower Extremities: Full ROM and Muscle Strength 5/5 Arises from Table with ease Narrow Based  Gait     Skin:    General: Skin is warm and dry.  Neurological:     Mental Status: She is alert and oriented to person, place, and time.  Psychiatric:        Mood and Affect: Mood normal.        Behavior: Behavior normal.         Assessment & Plan:  1. Chronic seizure disorder: No seizure's. Continue current medication regimen with  Gabapentin. Neurology Following. 04/24/2023. 2. Chronic muscle spasms, weakness with associated pain disorder: Continue current medication regimen with Baclofen. Continue with Exercise regime. 04/24/2023. 3. Chronic dysphagia: No complaints today. Continue to monitor. GI Following. 04/24/2023. 4. Anxiety with depression : No complaints today. Stable. Continue current medication regimen with  Elavil. 04/24/2023. 5. Fibromyalgia/ Rib Pain/Chronic Pain: Continue Diclofenac: Continue with exercise and heat Therapy. 04/24/2023. Refilled:  oxyCODONE 7.5/325mg  one tablet 5 times  daily as needed  #140. Second script sent for the following month. Continue with slow weaning.   We will continue the opioid monitoring program, this consists of regular clinic  visits, examinations, urine drug screen, pill counts as well as use of [place] Controlled Substance Reporting system. A 12 month History has been reviewed on the [place] Controlled Substance Reporting System 04/24/2023.  6. Peripheral Neuropathy: Continue current medication regimen with  Gabapentin: 04/24/2023.     F/U in 2 months

## 2023-04-30 ENCOUNTER — Other Ambulatory Visit: Payer: Self-pay | Admitting: Nurse Practitioner

## 2023-05-05 ENCOUNTER — Other Ambulatory Visit: Payer: Self-pay

## 2023-05-05 ENCOUNTER — Ambulatory Visit (INDEPENDENT_AMBULATORY_CARE_PROVIDER_SITE_OTHER): Payer: Medicare Other

## 2023-05-05 ENCOUNTER — Encounter: Payer: Self-pay | Admitting: Allergy and Immunology

## 2023-05-05 ENCOUNTER — Ambulatory Visit: Payer: Medicare Other | Admitting: Allergy and Immunology

## 2023-05-05 VITALS — Ht 67.0 in | Wt 228.0 lb

## 2023-05-05 VITALS — BP 122/68 | HR 95 | Temp 97.9°F | Resp 18 | Ht 68.0 in | Wt 226.0 lb

## 2023-05-05 DIAGNOSIS — J3089 Other allergic rhinitis: Secondary | ICD-10-CM

## 2023-05-05 DIAGNOSIS — K219 Gastro-esophageal reflux disease without esophagitis: Secondary | ICD-10-CM

## 2023-05-05 DIAGNOSIS — Z0000 Encounter for general adult medical examination without abnormal findings: Secondary | ICD-10-CM | POA: Diagnosis not present

## 2023-05-05 DIAGNOSIS — J4540 Moderate persistent asthma, uncomplicated: Secondary | ICD-10-CM | POA: Diagnosis not present

## 2023-05-05 MED ORDER — FAMOTIDINE 40 MG PO TABS: 40.0000 mg | ORAL_TABLET | Freq: Every day | ORAL | 5 refills | Status: DC

## 2023-05-05 MED ORDER — MOMETASONE FURO-FORMOTEROL FUM 200-5 MCG/ACT IN AERO: 2.0000 | INHALATION_SPRAY | Freq: Two times a day (BID) | RESPIRATORY_TRACT | 5 refills | Status: DC

## 2023-05-05 MED ORDER — OMEPRAZOLE 40 MG PO CPDR: 40.0000 mg | DELAYED_RELEASE_CAPSULE | Freq: Two times a day (BID) | ORAL | 5 refills | Status: DC

## 2023-05-05 MED ORDER — MONTELUKAST SODIUM 10 MG PO TABS: 10.0000 mg | ORAL_TABLET | Freq: Every day | ORAL | 1 refills | Status: DC

## 2023-05-05 MED ORDER — ALBUTEROL SULFATE HFA 108 (90 BASE) MCG/ACT IN AERS: 2.0000 | INHALATION_SPRAY | RESPIRATORY_TRACT | 1 refills | Status: DC | PRN

## 2023-05-05 NOTE — Patient Instructions (Addendum)
  1.  Allergen avoidance measures  - dust mite  2.  Continue to treat and prevent inflammation:  A.  Dulera 200 - 2 inhalations 1-2 times per day w/ spacer  B.  Montelukast 10 mg -1 tablet 1 time per day C. OTC Rhinocort /budesonide - 1-2 sprays each nostril 3-7 times per week  3.  Treat and prevent reflux/LPR:  A.  Minimize all forms of caffeine consumption B.  Omeprazole 40 mg -1 tablet twice a day C.  Famotidine 40 mg -1 tablet in evening D.  Replace throat clearing with swallowing maneuver  4.  If needed:  A. Albuterol HFA - 2 inhalations or nebulization every 4-6 hours B.  Antihistamine-cetirizine  5.  Return to clinic in 6 months or earlier if problem  6.  Obtain fall flu vaccine

## 2023-05-05 NOTE — Progress Notes (Unsigned)
[place] [facility] [place] [facility] [place] [facility] [name] [facility] [name]   Follow-up Note  Referring Provider: [name], * Primary Provider: [name], FNP Date of Office Visit: 05/05/2023  Subjective:   Autumn Davis (DOB: 1986/03/03) is a 37 y.o. female who [facility] in re-evaluation of the following:  HPI: Autumn Davis returns to this clinic in evaluation of asthma, LPR, VCD, history of laryngeal spasm, chronic systemic steroid use for connective tissue disease.  I last saw her in this clinic 02 September 2022.  Overall her asthma has been under excellent control.  She has been using her Dulera twice a day on a pretty consistent basis and rarely uses any short acting bronchodilator.  She has not required a systemic steroid to treat an exacerbation.  Her nose is doing okay while using montelukast.  She does not really use any nasal steroid as a lot of the nasal steroids and dog running down her throat.  She does get a little bit of stuffiness on occasion.  Her reflux is doing pretty well at this point in time.  However, she is been placed on progesterone and it is made her gain weight and increased weight is increased her reflux somewhat.  Even though her reflux has been a little more active she has not been having any throat problems and has not been having any laryngeal spasm.  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  Allergies as of 05/05/2023       Reactions   Ciprofloxacin Other (See Comments)   Muscle weakness and numbness Other reaction(s): Not available Muscle weakness and numbness Other reaction(s): Not available   Compazine Other (See Comments)   hallucinations   Prochlorperazine Other (See Comments), Rash   Pt states it makes her feel like things are crawling on her Other reaction(s): Not available        Medication List    acetaZOLAMIDE 250 MG  tablet Commonly known as: DIAMOX TAKE 1 TABLET EVERY MORNING, TAKE 1 TABLET AT 2PM, AND TAKE 2 TABLETSAT BEDTIME   albuterol 108 (90 Base) MCG/ACT inhaler Commonly known as: Ventolin HFA Inhale 2 puffs into the lungs every 4 (four) hours as needed for wheezing or shortness of breath.   amitriptyline 75 MG tablet Commonly known as: ELAVIL TAKE ONE TABLET BY MOUTH AT BEDTIME   atorvastatin 40 MG tablet Commonly known as: LIPITOR TAKE ONE (1) TABLET BY MOUTH EVERY DAY   baclofen 20 MG tablet Commonly known as: LIORESAL TAKE ONE TABLET FOUR TIMES DAILY   CALCIUM 1200+D3 PO Take 1 tablet by mouth daily.   cetirizine 10 MG tablet Commonly known as: ZYRTEC Take 1 tablet (10 mg total) by mouth 2 (two) times daily as needed for allergies (Can take an extra dose during flare ups.).   cromolyn 4 % ophthalmic solution Commonly known as: OPTICROM SMARTSIG:In Eye(s)   cyanocobalamin 1000 MCG/ML injection Commonly known as: VITAMIN B12 INJECT IM EVERY 30 DAYS   cycloSPORINE 0.05 % ophthalmic emulsion Commonly known as: RESTASIS Place 1 drop into both eyes 2 (two) times daily.   diclofenac 75 MG EC tablet Commonly known as: VOLTAREN TAKE ONE TABLET BY MOUTH TWICE DAILY   diclofenac Sodium 1 % Gel Commonly known as: Voltaren Apply 4 g topically 4 (four) times daily.   docusate sodium 100 MG capsule Commonly known as: COLACE Take 100 mg by mouth 2 (two) times daily.  PRN   EPINEPHrine 0.3 mg/0.3 mL Soaj injection Commonly known as: EPI-PEN Inject 0.3 mg into the muscle as needed for anaphylaxis.   famotidine 40 MG tablet Commonly known as: PEPCID Take 1 tablet (40 mg total) by mouth at bedtime.   furosemide 20 MG tablet Commonly known as: LASIX TAKE ONE (1) TABLET BY MOUTH EVERY DAY   gabapentin 600 MG tablet Commonly known as: NEURONTIN 1 po 3x a day and 2 at bedtime   ipratropium-albuterol 0.5-2.5 (3) MG/3ML Soln Commonly known as: DUONEB Take 3 mLs by  nebulization every 4 (four) hours as needed.   L-Methylfolate 15 MG Tabs Take 1 tablet (15 mg total) by mouth daily.   MAGNESIUM GLYCINATE PO Take 2 tablets by mouth daily.   metoprolol succinate 25 MG 24 hr tablet Commonly known as: TOPROL-XL Take 1 tablet (25 mg total) by mouth daily.   mirabegron ER 25 MG Tb24 tablet Commonly known as: MYRBETRIQ Take 1 tablet (25 mg total) by mouth daily.   mometasone-formoterol 200-5 MCG/ACT Aero Commonly known as: DULERA Inhale 2 puffs into the lungs in the morning and at bedtime.   montelukast 10 MG tablet Commonly known as: SINGULAIR Take 1 tablet (10 mg total) by mouth at bedtime.   mupirocin ointment 2 % Commonly known as: BACTROBAN APPLY A SMALL AMOUNT TO THE AFFECTED AREA BY TOPICAL ROUTE 3 TIMES PER DAY   norethindrone 0.35 MG tablet Commonly known as: MICRONOR Take 1 tablet by mouth daily.   nystatin 100000 UNIT/ML suspension Commonly known as: MYCOSTATIN Use as directed 5 mLs (500,000 Units total) in the mouth or throat 4 (four) times daily.   nystatin cream Commonly known as: MYCOSTATIN Apply 1 application topically 2 (two) times daily.   nystatin-triamcinolone ointment Commonly known as: MYCOLOG Apply 1 application topically 2 (two) times daily.   Omega 3 1000 MG Caps Take 1 capsule by mouth daily.   omeprazole 40 MG capsule Commonly known as: PRILOSEC Take 1 capsule (40 mg total) by mouth 2 (two) times daily.   oxyCODONE-acetaminophen 7.5-325 MG tablet Commonly known as: Percocet Take 1 tablet by mouth 5 (five) times daily as needed for moderate pain. No More Than 5 a day.   phenazopyridine 100 MG tablet Commonly known as: Pyridium Take 1 tablet (100 mg total) by mouth 3 (three) times daily as needed for pain.   predniSONE 5 MG tablet Commonly known as: DELTASONE TAKE ONE TABLET BY MOUTH EVERY MORNING   PROBIOTIC-10 PO Take by mouth.   promethazine 12.5 MG tablet Commonly known as: PHENERGAN TAKE 1  TABLET BY MOUTH EVERY 8 HOURS AS NEEDED FOR NAUSEA & VOMITING   Qnasl 80 MCG/ACT Aers Generic drug: Beclomethasone Dipropionate   sulfamethoxazole-trimethoprim 800-160 MG tablet Commonly known as: BACTRIM DS Take 1 tablet by mouth 2 (two) times daily.   tamsulosin 0.4 MG Caps capsule Commonly known as: FLOMAX Take 1 capsule (0.4 mg total) by mouth daily after breakfast.   VITAMIN C ADULT GUMMIES PO Take 2 each by mouth daily.   Vitamin D (Ergocalciferol) 1.25 MG (50000 UNIT) Caps capsule Commonly known as: DRISDOL TAKE 1 CAPSULE BY MOUTH EVERY 7 DAYS   vitamin E 180 MG (400 UNITS) capsule Take 1,000 Units by mouth daily.   ZINC PO Take 1 tablet by mouth daily.     Past Medical History:  Diagnosis Date   Allergy    Arthritis    HANDS,HIPS,KNEES   Asthma    Bronchitis, chronic/intermittent 01/22/2012   Depression  01/22/2012   GERD (gastroesophageal reflux disease)    Hyperlipidemia    IBS (irritable bowel syndrome) 01/22/2012   Lupus (HCC)    Neuromuscular disorder (HCC)    Osteoporosis    Seizures (HCC)    SOB (shortness of breath)    Thyroid disease     Past Surgical History:  Procedure Laterality Date   20 HOUR PH STUDY N/A 10/29/2015   Procedure: 24 HOUR PH STUDY;  Surgeon: Iva Boop, MD;  Location: [facility];  Service: Endoscopy;  Laterality: N/A;   COLONOSCOPY     ESOPHAGEAL MANOMETRY N/A 10/29/2015   Procedure: ESOPHAGEAL MANOMETRY (EM);  Surgeon: Iva Boop, MD;  Location: [facility];  Service: Endoscopy;  Laterality: N/A;   KNEE SURGERY  2002/2003   bil   RECTAL SURGERY  correction of prolapse   2010    Review of systems negative except as noted in HPI / PMHx or noted below:  Review of Systems  Constitutional: Negative.   HENT: Negative.    Eyes: Negative.   Respiratory: Negative.    Cardiovascular: Negative.   Gastrointestinal: Negative.   Genitourinary: Negative.   Musculoskeletal: Negative.   Skin: Negative.    Neurological: Negative.   Endo/Heme/Allergies: Negative.   Psychiatric/Behavioral: Negative.       Objective:   Vitals:   05/05/23 1540  BP: 122/68  Pulse: 95  Resp: 18  Temp: 97.9 F (36.6 C)  SpO2: 96%   Height: 5\' 8"  (172.7 cm)  Weight: 226 lb (102.5 kg)   Physical Exam Constitutional:      Appearance: She is not diaphoretic.  HENT:     Head: Normocephalic.     Right Ear: Tympanic membrane, ear canal and external ear normal.     Left Ear: Tympanic membrane, ear canal and external ear normal.     Nose: Nose normal. No mucosal edema or rhinorrhea.     Mouth/Throat:     Pharynx: Uvula midline. No oropharyngeal exudate.  Eyes:     Conjunctiva/sclera: Conjunctivae normal.  Neck:     Thyroid: No thyromegaly.     Trachea: Trachea normal. No tracheal tenderness or tracheal deviation.  Cardiovascular:     Rate and Rhythm: Normal rate and regular rhythm.     Heart sounds: Normal heart sounds, S1 normal and S2 normal. No murmur heard. Pulmonary:     Effort: No respiratory distress.     Breath sounds: Normal breath sounds. No stridor. No wheezing or rales.  Lymphadenopathy:     Head:     Right side of head: No tonsillar adenopathy.     Left side of head: No tonsillar adenopathy.     Cervical: No cervical adenopathy.  Skin:    Findings: No erythema or rash.     Nails: There is no clubbing.  Neurological:     Mental Status: She is alert.     Diagnostics:    Spirometry was performed and demonstrated an FEV1 of 2.93 at 85 % of predicted.  The patient had an Asthma Control Test with the following results: ACT Total Score: 10.    Assessment and Plan:   1. Asthma, moderate persistent, well-controlled   2. Perennial allergic rhinitis   3. LPRD (laryngopharyngeal reflux disease)    1.  Allergen avoidance measures  - dust mite  2.  Continue to treat and prevent inflammation:  A.  Dulera 200 - 2 inhalations 1-2 times per day w/ spacer  B.  Montelukast 10 mg -1  tablet  1 time per day C.  OTC Rhinocort /budesonide - 1-2 sprays each nostril 3-7 times per week  3.  Treat and prevent reflux/LPR:  A.  Minimize all forms of caffeine consumption B.  Omeprazole 40 mg -1 tablet twice a day C.  Famotidine 40 mg -1 tablet in evening D.  Replace throat clearing with swallowing maneuver  4.  If needed:  A. Albuterol HFA - 2 inhalations or nebulization every 4-6 hours B.  Antihistamine-cetirizine  5.  Return to clinic in 6 months or earlier if problem  6.  Obtain fall flu vaccine  Autumn Davis appears to be doing very well and she will remain on Dulera and montelukast and she can add in some over-the-counter Rhinocort should it be required to address her respiratory tract disease and she can continue on therapy for reflux as noted above and assuming she does well with this plan I will see her back in this clinic in 6 months or earlier if there is a problem.   Autumn Schimke, MD Allergy / Immunology  [facility]

## 2023-05-05 NOTE — Progress Notes (Signed)
Subjective:   Autumn Davis is a 37 y.o. female who presents for Medicare Annual (Subsequent) preventive examination.  I connected with  Autumn Davis Monday on 05/05/23 by a audio enabled telemedicine application and verified that I am speaking with the correct person using two identifiers.  Patient Location: Home  Provider Location: Home Office  I discussed the limitations of evaluation and management by telemedicine. The patient expressed understanding and agreed to proceed.  Patient Medicare AWV questionnaire was completed by the patient on 05/05/2023; I have confirmed that all information answered by patient is correct and no changes since this date.  Review of Systems     Cardiac Risk Factors include: none     Objective:    Today's Vitals   05/05/23 0946  Weight: 228 lb (103.4 kg)  Height: 5\' 7"  (1.702 m)   Body mass index is 35.71 kg/m.     05/05/2023    9:50 AM 05/01/2022   10:02 AM 04/30/2021   10:25 AM 09/11/2017    3:02 PM 07/14/2017    1:03 PM 04/16/2017   11:37 AM 03/12/2017    1:46 PM  Advanced Directives  Does Patient Have a Medical Advance Directive? No No No No No No No  Would patient like information on creating a medical advance directive? No - Patient declined No - Patient declined No - Patient declined  No - Patient declined  No - Patient declined    Current Medications (verified) Outpatient Encounter Medications as of 05/05/2023  Medication Sig   acetaZOLAMIDE (DIAMOX) 250 MG tablet TAKE 1 TABLET EVERY MORNING, TAKE 1 TABLET AT 2PM, AND TAKE 2 TABLETSAT BEDTIME   albuterol (VENTOLIN HFA) 108 (90 Base) MCG/ACT inhaler Inhale 2 puffs into the lungs every 4 (four) hours as needed for wheezing or shortness of breath.   amitriptyline (ELAVIL) 75 MG tablet TAKE ONE TABLET BY MOUTH AT BEDTIME   Ascorbic Acid (VITAMIN C ADULT GUMMIES PO) Take 2 each by mouth daily.   atorvastatin (LIPITOR) 40 MG tablet TAKE ONE (1) TABLET BY MOUTH EVERY DAY    baclofen (LIORESAL) 20 MG tablet TAKE ONE TABLET FOUR TIMES DAILY   Beclomethasone Dipropionate (QNASL) 80 MCG/ACT AERS    budesonide-formoterol (SYMBICORT) 160-4.5 MCG/ACT inhaler    Calcium-Magnesium-Vitamin D (CALCIUM 1200+D3 PO) Take 1 tablet by mouth daily.   cetirizine (ZYRTEC) 10 MG tablet Take 1 tablet (10 mg total) by mouth 2 (two) times daily as needed for allergies (Can take an extra dose during flare ups.).   cromolyn (OPTICROM) 4 % ophthalmic solution SMARTSIG:In Eye(s)   cyanocobalamin (VITAMIN B12) 1000 MCG/ML injection INJECT IM EVERY 30 DAYS   cycloSPORINE (RESTASIS) 0.05 % ophthalmic emulsion Place 1 drop into both eyes 2 (two) times daily.   diclofenac (VOLTAREN) 75 MG EC tablet TAKE ONE TABLET BY MOUTH TWICE DAILY   diclofenac Sodium (VOLTAREN) 1 % GEL Apply 4 g topically 4 (four) times daily.   docusate sodium (COLACE) 100 MG capsule Take 100 mg by mouth 2 (two) times daily. PRN   EPINEPHrine 0.3 mg/0.3 mL IJ SOAJ injection Inject 0.3 mg into the muscle as needed for anaphylaxis.   famotidine (PEPCID) 40 MG tablet Take 1 tablet (40 mg total) by mouth at bedtime.   furosemide (LASIX) 20 MG tablet TAKE ONE (1) TABLET BY MOUTH EVERY DAY   gabapentin (NEURONTIN) 600 MG tablet 1 po 3x a day and 2 at bedtime   ipratropium-albuterol (DUONEB) 0.5-2.5 (3) MG/3ML SOLN Take  3 mLs by nebulization every 4 (four) hours as needed.   L-Methylfolate 15 MG TABS Take 1 tablet (15 mg total) by mouth daily.   MAGNESIUM GLYCINATE PO Take 2 tablets by mouth daily.   metoprolol succinate (TOPROL-XL) 25 MG 24 hr tablet Take 1 tablet (25 mg total) by mouth daily.   mirabegron ER (MYRBETRIQ) 25 MG TB24 tablet Take 1 tablet (25 mg total) by mouth daily.   mometasone-formoterol (DULERA) 200-5 MCG/ACT AERO Inhale 2 puffs into the lungs in the morning and at bedtime.   montelukast (SINGULAIR) 10 MG tablet Take 1 tablet (10 mg total) by mouth at bedtime.   Multiple Vitamins-Minerals (ZINC PO) Take 1  tablet by mouth daily.   mupirocin ointment (BACTROBAN) 2 % APPLY A SMALL AMOUNT TO THE AFFECTED AREA BY TOPICAL ROUTE 3 TIMES PER DAY   norethindrone (MICRONOR) 0.35 MG tablet Take 1 tablet by mouth daily.   nystatin (MYCOSTATIN) 100000 UNIT/ML suspension Use as directed 5 mLs (500,000 Units total) in the mouth or throat 4 (four) times daily.   nystatin cream (MYCOSTATIN) Apply 1 application topically 2 (two) times daily.   nystatin-triamcinolone ointment (MYCOLOG) Apply 1 application topically 2 (two) times daily.   Omega 3 1000 MG CAPS Take 1 capsule by mouth daily.   omeprazole (PRILOSEC) 40 MG capsule Take 1 capsule (40 mg total) by mouth 2 (two) times daily.   oxyCODONE-acetaminophen (PERCOCET) 7.5-325 MG tablet Take 1 tablet by mouth 5 (five) times daily as needed for moderate pain. No More Than 5 a day.   phenazopyridine (PYRIDIUM) 100 MG tablet Take 1 tablet (100 mg total) by mouth 3 (three) times daily as needed for pain.   predniSONE (DELTASONE) 5 MG tablet TAKE ONE TABLET BY MOUTH EVERY MORNING   Probiotic Product (PROBIOTIC-10 PO) Take by mouth.   promethazine (PHENERGAN) 12.5 MG tablet TAKE 1 TABLET BY MOUTH EVERY 8 HOURS AS NEEDED FOR NAUSEA & VOMITING   sulfamethoxazole-trimethoprim (BACTRIM DS) 800-160 MG tablet Take 1 tablet by mouth 2 (two) times daily.   tamsulosin (FLOMAX) 0.4 MG CAPS capsule Take 1 capsule (0.4 mg total) by mouth daily after breakfast.   Vitamin D, Ergocalciferol, (DRISDOL) 1.25 MG (50000 UNIT) CAPS capsule TAKE 1 CAPSULE BY MOUTH EVERY 7 DAYS   vitamin E 180 MG (400 UNITS) capsule Take 1,000 Units by mouth daily.    No facility-administered encounter medications on file as of 05/05/2023.    Allergies (verified) Ciprofloxacin, Compazine, and Prochlorperazine   History: Past Medical History:  Diagnosis Date   Allergy    Arthritis    HANDS,HIPS,KNEES   Asthma    Bronchitis, chronic/intermittent 01/22/2012   Depression 01/22/2012   GERD  (gastroesophageal reflux disease)    Hyperlipidemia    IBS (irritable bowel syndrome) 01/22/2012   Lupus (HCC)    Neuromuscular disorder (HCC)    Osteoporosis    Seizures (HCC)    SOB (shortness of breath)    Thyroid disease    Past Surgical History:  Procedure Laterality Date   72 HOUR PH STUDY N/A 10/29/2015   Procedure: 24 HOUR PH STUDY;  Surgeon: Iva Boop, MD;  Location: [facility];  Service: Endoscopy;  Laterality: N/A;   COLONOSCOPY     ESOPHAGEAL MANOMETRY N/A 10/29/2015   Procedure: ESOPHAGEAL MANOMETRY (EM);  Surgeon: Iva Boop, MD;  Location: [facility];  Service: Endoscopy;  Laterality: N/A;   KNEE SURGERY  2002/2003   bil   RECTAL SURGERY  correction of prolapse  2010   Family History  Problem Relation Age of Onset   Thyroid disease Mother    Colon polyps Father    Lung cancer Maternal Grandmother    Cancer Paternal Grandmother        colon/pancreatiec/lymphoma   Irritable bowel syndrome Brother    AAA (abdominal aortic aneurysm) Neg Hx    Social History   Socioeconomic History   Marital status: Legally Separated    Spouse name: Not on file   Number of children: 1   Years of education: Not on file   Highest education level: Not on file  Occupational History   Occupation: disabled    Employer: [employer]  Tobacco Use   Smoking status: Never   Smokeless tobacco: Never  Vaping Use   Vaping Use: Never used  Substance and Sexual Activity   Alcohol use: No    Alcohol/week: 0.0 standard drinks of alcohol   Drug use: No   Sexual activity: Not on file  Other Topics Concern   Not on file  Social History Narrative   She lives alone with her 17 year old son - right now - they are living with her parents right now   Social Determinants of Health   Financial Resource Strain: Low Risk  (05/05/2023)   Overall Financial Resource Strain (CARDIA)    Difficulty of Paying Living Expenses: Not hard at all  Food Insecurity: No Food  Insecurity (05/05/2023)   Hunger Vital Sign    Worried About Running Out of Food in the Last Year: Never true    Ran Out of Food in the Last Year: Never true  Transportation Needs: No Transportation Needs (05/05/2023)   PRAPARE - Administrator, Civil Service (Medical): No    Lack of Transportation (Non-Medical): No  Physical Activity: Insufficiently Active (05/05/2023)   Exercise Vital Sign    Days of Exercise per Week: 2 days    Minutes of Exercise per Session: 30 min  Stress: No Stress Concern Present (05/05/2023)   [employer] of Occupational Health - Occupational Stress Questionnaire    Feeling of Stress : Not at all  Social Connections: Moderately Isolated (05/05/2023)   Social Connection and Isolation Panel [NHANES]    Frequency of Communication with Friends and Family: More than three times a week    Frequency of Social Gatherings with Friends and Family: More than three times a week    Attends Religious Services: More than 4 times per year    Active Member of [employer] or Organizations: No    Attends Banker Meetings: Never    Marital Status: Separated    Tobacco Counseling Counseling given: Not Answered   Clinical Intake:  Pre-visit preparation completed: Yes  Pain : No/denies pain     Nutritional Risks: None Diabetes: No  How often do you need to have someone help you when you read instructions, pamphlets, or other written materials from your doctor or pharmacy?: 1 - Never  Interpreter Needed?: No  Information entered by :: Renie Ora, LPN   Activities of Daily Living    05/05/2023    9:47 AM  In your present state of health, do you have any difficulty performing the following activities:  Hearing? 0  Vision? 0  Difficulty concentrating or making decisions? 0  Walking or climbing stairs? 0  Dressing or bathing? 0  Doing errands, shopping? 0  Preparing Food and eating ? N  Using the Toilet? N  In the past  six months, have you  accidently leaked urine? N  Do you have problems with loss of bowel control? N  Managing your Medications? N  Managing your Finances? N  Housekeeping or managing your Housekeeping? Y    Patient Care Team: [name], FNP as PCP - General (Nurse Practitioner) Charna Elizabeth, MD as Attending Physician (Gastroenterology) Antonietta Breach, MD as Referring Physician (Neurology) Coralyn Helling, MD as Consulting Physician (Pulmonary Disease) Candice Camp, MD as Consulting Physician (Obstetrics and Gynecology)  Indicate any recent Medical Services you may have received from other than [facility] providers in the past year (date may be approximate).     Assessment:   This is a routine wellness examination for Parker.  Hearing/Vision screen Vision Screening - Comments:: Wears rx glasses - up to date with routine eye exams with  Dr,Lee   Dietary issues and exercise activities discussed:     Goals Addressed             This Visit's Progress    DIET - INCREASE WATER INTAKE         Depression Screen    05/05/2023    9:49 AM 12/08/2022   11:19 AM 11/26/2022    4:14 PM 07/28/2022   11:31 AM 05/22/2022   12:20 PM 05/01/2022   10:00 AM 02/24/2022    8:32 AM  PHQ 2/9 Scores  PHQ - 2 Score 0 0 1 0 1 2 0  PHQ- 9 Score   4  3 5      Fall Risk    05/05/2023    9:51 AM 12/08/2022   11:19 AM 07/28/2022   11:31 AM 05/22/2022   12:19 PM 05/01/2022    9:50 AM  Fall Risk   Falls in the past year? 1 1 1 1 1   Number falls in past yr: 1 0 1 1 1   Comment   no recent    Injury with Fall? 1 0 0 1 0  Risk for fall due to : History of fall(s);Impaired balance/gait;Orthopedic patient Impaired balance/gait History of fall(s);Impaired balance/gait  History of fall(s);Impaired balance/gait;Orthopedic patient;Medication side effect  Follow up Education provided;Falls prevention discussed    Education provided;Falls prevention discussed    MEDICARE RISK AT HOME:  Medicare Risk at Home - 05/05/23 0951      Any stairs in or around the home? Yes    If so, are there any without handrails? No    Home free of loose throw rugs in walkways, pet beds, electrical cords, etc? Yes    Adequate lighting in your home to reduce risk of falls? Yes    Life alert? Yes    Use of a cane, walker or w/c? No    Grab bars in the bathroom? Yes    Shower chair or bench in shower? Yes    Elevated toilet seat or a handicapped toilet? Yes             TIMED UP AND GO:  Was the test performed?  No    Cognitive Function:        05/05/2023    9:52 AM 05/01/2022   10:05 AM  6CIT Screen  What Year? 0 points 0 points  What month? 0 points 0 points  What time? 0 points 0 points  Count back from 20 0 points 0 points  Months in reverse 0 points 0 points  Repeat phrase 0 points 4 points  Total Score 0 points 4 points    Immunizations Immunization History  Administered Date(s) Administered   Influenza Split 08/27/2012   Influenza,inj,Quad PF,6+ Mos 08/29/2013, 08/31/2014, 09/27/2015, 09/12/2016, 09/23/2017, 09/21/2018, 09/26/2019, 11/26/2020, 10/09/2021, 10/24/2022   Pneumococcal Conjugate-13 08/31/2014   Pneumococcal Polysaccharide-23 08/29/2013   Tdap 02/23/2015    TDAP status: Up to date  Flu Vaccine status: Declined, Education has been provided regarding the importance of this vaccine but patient still declined. Advised may receive this vaccine at local pharmacy or Health Dept. Aware to provide a copy of the vaccination record if obtained from local pharmacy or Health Dept. Verbalized acceptance and understanding.  Pneumococcal vaccine status: Up to date  Covid-19 vaccine status: Declined, Education has been provided regarding the importance of this vaccine but patient still declined. Advised may receive this vaccine at local pharmacy or Health Dept.or vaccine clinic. Aware to provide a copy of the vaccination record if obtained from local pharmacy or Health Dept. Verbalized acceptance and  understanding.  Qualifies for Shingles Vaccine? No   Zostavax completed No   Shingrix Completed?: No.    Education has been provided regarding the importance of this vaccine. Patient has been advised to call insurance company to determine out of pocket expense if they have not yet received this vaccine. Advised may also receive vaccine at local pharmacy or Health Dept. Verbalized acceptance and understanding.  Screening Tests Health Maintenance  Topic Date Due   COVID-19 Vaccine (1) Never done   Hepatitis C Screening  Never done   PAP SMEAR-Modifier  09/26/2022   INFLUENZA VACCINE  06/18/2023   MAMMOGRAM  09/09/2023   Medicare Annual Wellness (AWV)  05/04/2024   DTaP/Tdap/Td (2 - Td or Tdap) 02/22/2025   HIV Screening  Completed   HPV VACCINES  Aged Out    Health Maintenance  Health Maintenance Due  Topic Date Due   COVID-19 Vaccine (1) Never done   Hepatitis C Screening  Never done   PAP SMEAR-Modifier  09/26/2022    Colorectal cancer screening: Referral to GI placed not of age . Pt aware the office will call re: appt.  Mammogram status: Completed 09/08/2022. Repeat every year  Bone Density status: Ordered not of age . Pt provided with contact info and advised to call to schedule appt.  Lung Cancer Screening: (Low Dose CT Chest recommended if Age 56-80 years, 20 pack-year currently smoking OR have quit w/in 15years.) does not qualify.   Lung Cancer Screening Referral: n/a  Additional Screening:  Hepatitis C Screening: does not qualify;   Vision Screening: Recommended annual ophthalmology exams for early detection of glaucoma and other disorders of the eye. Is the patient up to date with their annual eye exam?  Yes  Who is the provider or what is the name of the office in which the patient attends annual eye exams? Dr.Lee  If pt is not established with a provider, would they like to be referred to a provider to establish care? No .   Dental Screening: Recommended  annual dental exams for proper oral hygiene  Diabetic Foot Exam: Diabetic Foot Exam: Overdue, Pt has been advised about the importance in completing this exam. Pt is scheduled for diabetic foot exam on next office visit .  Community Resource Referral / Chronic Care Management: CRR required this visit?  No   CCM required this visit?  No     Plan:     I have personally reviewed and noted the following in the patient's chart:   Medical and social history Use of alcohol, tobacco or illicit drugs  Current  medications and supplements including opioid prescriptions. Patient is currently taking opioid prescriptions. Information provided to patient regarding non-opioid alternatives. Patient advised to discuss non-opioid treatment plan with their provider. Functional ability and status Nutritional status Physical activity Advanced directives List of other physicians Hospitalizations, surgeries, and ER visits in previous 12 months Vitals Screenings to include cognitive, depression, and falls Referrals and appointments  In addition, I have reviewed and discussed with patient certain preventive protocols, quality metrics, and best practice recommendations. A written personalized care plan for preventive services as well as general preventive health recommendations were provided to patient.     Lorrene Reid, LPN   1/61/0960   After Visit Summary: (MyChart) Due to this being a telephonic visit, the after visit summary with patients personalized plan was offered to patient via MyChart   Nurse Notes: none

## 2023-05-05 NOTE — Patient Instructions (Signed)
[name] , Thank you for taking time to come for your Medicare Wellness Visit. I appreciate your ongoing commitment to your health goals. Please review the following plan we discussed and let me know if I can assist you in the future.   These are the goals we discussed:  Goals      DIET - INCREASE WATER INTAKE     Patient Stated     Would like to start exercising again, lose weight and feel better        This is a list of the screening recommended for you and due dates:  Health Maintenance  Topic Date Due   COVID-19 Vaccine (1) Never done   Hepatitis C Screening  Never done   Pap Smear  09/26/2022   Flu Shot  06/18/2023   Mammogram  09/09/2023   Medicare Annual Wellness Visit  05/04/2024   DTaP/Tdap/Td vaccine (2 - Td or Tdap) 02/22/2025   HIV Screening  Completed   HPV Vaccine  Aged Out    Advanced directives: Advance directive discussed with you today. I have provided a copy for you to complete at home and have notarized. Once this is complete please bring a copy in to our office so we can scan it into your chart.   Conditions/risks identified: Aim for 30 minutes of exercise or brisk walking, 6-8 glasses of water, and 5 servings of fruits and vegetables each day.   Next appointment: Follow up in one year for your annual wellness visit.   Preventive Care 91-37 Years Old, Female Preventive care refers to lifestyle choices and visits with your health care provider that can promote health and wellness. Preventive care visits are also called wellness exams. What can I expect for my preventive care visit? Counseling During your preventive care visit, your health care provider may ask about your: Medical history, including: Past medical problems. Family medical history. Pregnancy history. Current health, including: Menstrual cycle. Method of birth control. Emotional well-being. Home life and relationship well-being. Sexual activity and sexual health. Lifestyle,  including: Alcohol, nicotine or tobacco, and drug use. Access to firearms. Diet, exercise, and sleep habits. Work and work Astronomer. Sunscreen use. Safety issues such as seatbelt and bike helmet use. Physical exam Your health care provider may check your: Height and weight. These may be used to calculate your BMI (body mass index). BMI is a measurement that tells if you are at a healthy weight. Waist circumference. This measures the distance around your waistline. This measurement also tells if you are at a healthy weight and may help predict your risk of certain diseases, such as type 2 diabetes and high blood pressure. Heart rate and blood pressure. Body temperature. Skin for abnormal spots. What immunizations do I need? Vaccines are usually given at various ages, according to a schedule. Your health care provider will recommend vaccines for you based on your age, medical history, and lifestyle or other factors, such as travel or where you work. What tests do I need? Screening Your health care provider may recommend screening tests for certain conditions. This may include: Pelvic exam and Pap test. Lipid and cholesterol levels. Diabetes screening. This is done by checking your blood sugar (glucose) after you have not eaten for a while (fasting). Hepatitis B test. Hepatitis C test. HIV (human immunodeficiency virus) test. STI (sexually transmitted infection) testing, if you are at risk. BRCA-related cancer screening. This may be done if you have a family history of breast, ovarian, tubal, or peritoneal  cancers. Talk with your health care provider about your test results, treatment options, and if necessary, the need for more tests. Follow these instructions at home: Eating and drinking  Eat a healthy diet that includes fresh fruits and vegetables, whole grains, lean protein, and low-fat dairy products. Take vitamin and mineral supplements as recommended by your health care  provider. Do not drink alcohol if: Your health care provider tells you not to drink. You are pregnant, may be pregnant, or are planning to become pregnant. If you drink alcohol: Limit how much you have to 0-1 drink a day. Know how much alcohol is in your drink. In the [place] one drink equals one 12 oz bottle of beer (355 mL), one 5 oz glass of wine (148 mL), or one 1 oz glass of hard liquor (44 mL). Lifestyle Brush your teeth every morning and night with fluoride toothpaste. Floss one time each day. Exercise for at least 30 minutes 5 or more days each week. Do not use any products that contain nicotine or tobacco. These products include cigarettes, chewing tobacco, and vaping devices, such as e-cigarettes. If you need help quitting, ask your health care provider. Do not use drugs. If you are sexually active, practice safe sex. Use a condom or other form of protection to prevent STIs. If you do not wish to become pregnant, use a form of birth control. If you plan to become pregnant, see your health care provider for a prepregnancy visit. Find healthy ways to manage stress, such as: Meditation, yoga, or listening to music. Journaling. Talking to a trusted person. Spending time with friends and family. Minimize exposure to UV radiation to reduce your risk of skin cancer. Safety Always wear your seat belt while driving or riding in a vehicle. Do not drive: If you have been drinking alcohol. Do not ride with someone who has been drinking. If you have been using any mind-altering substances or drugs. While texting. When you are tired or distracted. Wear a helmet and other protective equipment during sports activities. If you have firearms in your house, make sure you follow all gun safety procedures. Seek help if you have been physically or sexually abused. What's next? Go to your health care provider once a year for an annual wellness visit. Ask your health care provider how often you  should have your eyes and teeth checked. Stay up to date on all vaccines. This information is not intended to replace advice given to you by your health care provider. Make sure you discuss any questions you have with your health care provider. Document Revised: 05/01/2021 Document Reviewed: 05/01/2021 Elsevier Patient Education  2022 [employer].

## 2023-05-06 ENCOUNTER — Encounter: Payer: Self-pay | Admitting: Allergy and Immunology

## 2023-05-16 ENCOUNTER — Other Ambulatory Visit: Payer: Self-pay | Admitting: Nurse Practitioner

## 2023-05-16 DIAGNOSIS — K581 Irritable bowel syndrome with constipation: Secondary | ICD-10-CM

## 2023-05-16 DIAGNOSIS — I479 Paroxysmal tachycardia, unspecified: Secondary | ICD-10-CM

## 2023-05-16 DIAGNOSIS — D8989 Other specified disorders involving the immune mechanism, not elsewhere classified: Secondary | ICD-10-CM

## 2023-05-29 ENCOUNTER — Ambulatory Visit: Payer: Medicare Other | Admitting: Nurse Practitioner

## 2023-06-01 ENCOUNTER — Other Ambulatory Visit: Payer: Self-pay | Admitting: Nurse Practitioner

## 2023-06-01 DIAGNOSIS — D8989 Other specified disorders involving the immune mechanism, not elsewhere classified: Secondary | ICD-10-CM

## 2023-06-02 ENCOUNTER — Ambulatory Visit (INDEPENDENT_AMBULATORY_CARE_PROVIDER_SITE_OTHER): Payer: Medicare Other | Admitting: Nurse Practitioner

## 2023-06-02 ENCOUNTER — Encounter: Payer: Self-pay | Admitting: Nurse Practitioner

## 2023-06-02 VITALS — BP 134/89 | HR 82 | Temp 98.1°F | Resp 20 | Ht 68.0 in | Wt 231.0 lb

## 2023-06-02 DIAGNOSIS — K581 Irritable bowel syndrome with constipation: Secondary | ICD-10-CM

## 2023-06-02 DIAGNOSIS — R600 Localized edema: Secondary | ICD-10-CM

## 2023-06-02 DIAGNOSIS — R350 Frequency of micturition: Secondary | ICD-10-CM

## 2023-06-02 DIAGNOSIS — R1319 Other dysphagia: Secondary | ICD-10-CM | POA: Diagnosis not present

## 2023-06-02 DIAGNOSIS — M797 Fibromyalgia: Secondary | ICD-10-CM | POA: Diagnosis not present

## 2023-06-02 DIAGNOSIS — I479 Paroxysmal tachycardia, unspecified: Secondary | ICD-10-CM

## 2023-06-02 DIAGNOSIS — M329 Systemic lupus erythematosus, unspecified: Secondary | ICD-10-CM | POA: Diagnosis not present

## 2023-06-02 DIAGNOSIS — E559 Vitamin D deficiency, unspecified: Secondary | ICD-10-CM

## 2023-06-02 DIAGNOSIS — E782 Mixed hyperlipidemia: Secondary | ICD-10-CM | POA: Diagnosis not present

## 2023-06-02 DIAGNOSIS — K219 Gastro-esophageal reflux disease without esophagitis: Secondary | ICD-10-CM

## 2023-06-02 DIAGNOSIS — R569 Unspecified convulsions: Secondary | ICD-10-CM

## 2023-06-02 DIAGNOSIS — F3342 Major depressive disorder, recurrent, in full remission: Secondary | ICD-10-CM

## 2023-06-02 DIAGNOSIS — Z6833 Body mass index (BMI) 33.0-33.9, adult: Secondary | ICD-10-CM

## 2023-06-02 DIAGNOSIS — D8989 Other specified disorders involving the immune mechanism, not elsewhere classified: Secondary | ICD-10-CM

## 2023-06-02 MED ORDER — AMITRIPTYLINE HCL 75 MG PO TABS
75.0000 mg | ORAL_TABLET | Freq: Every day | ORAL | 1 refills | Status: DC
Start: 2023-06-02 — End: 2023-12-21

## 2023-06-02 MED ORDER — GABAPENTIN 600 MG PO TABS
ORAL_TABLET | ORAL | 1 refills | Status: DC
Start: 2023-06-02 — End: 2023-12-21

## 2023-06-02 MED ORDER — FUROSEMIDE 20 MG PO TABS
20.0000 mg | ORAL_TABLET | Freq: Every day | ORAL | 1 refills | Status: DC
Start: 2023-06-02 — End: 2023-12-21

## 2023-06-02 MED ORDER — BACLOFEN 20 MG PO TABS
ORAL_TABLET | ORAL | 1 refills | Status: DC
Start: 2023-06-02 — End: 2023-08-31

## 2023-06-02 MED ORDER — ACETAZOLAMIDE 250 MG PO TABS
ORAL_TABLET | ORAL | 1 refills | Status: DC
Start: 2023-06-02 — End: 2023-12-21

## 2023-06-02 MED ORDER — PREDNISONE 5 MG PO TABS
5.0000 mg | ORAL_TABLET | Freq: Every morning | ORAL | 1 refills | Status: DC
Start: 2023-06-02 — End: 2023-11-20

## 2023-06-02 MED ORDER — ATORVASTATIN CALCIUM 40 MG PO TABS
ORAL_TABLET | ORAL | 1 refills | Status: DC
Start: 2023-06-02 — End: 2023-11-20

## 2023-06-02 MED ORDER — METOPROLOL SUCCINATE ER 25 MG PO TB24
25.0000 mg | ORAL_TABLET | Freq: Every day | ORAL | 1 refills | Status: DC
Start: 2023-06-02 — End: 2023-12-21

## 2023-06-02 MED ORDER — TAMSULOSIN HCL 0.4 MG PO CAPS
0.4000 mg | ORAL_CAPSULE | Freq: Every day | ORAL | 1 refills | Status: DC
Start: 2023-06-02 — End: 2023-12-21

## 2023-06-02 NOTE — Patient Instructions (Signed)
Insomnia Insomnia is a sleep disorder that makes it difficult to fall asleep or stay asleep. Insomnia can cause fatigue, low energy, difficulty concentrating, mood swings, and poor performance at work or school. There are three different ways to classify insomnia: Difficulty falling asleep. Difficulty staying asleep. Waking up too early in the morning. Any type of insomnia can be long-term (chronic) or short-term (acute). Both are common. Short-term insomnia usually lasts for 3 months or less. Chronic insomnia occurs at least three times a week for longer than 3 months. What are the causes? Insomnia may be caused by another condition, situation, or substance, such as: Having certain mental health conditions, such as anxiety and depression. Using caffeine, alcohol, tobacco, or drugs. Having gastrointestinal conditions, such as gastroesophageal reflux disease (GERD). Having certain medical conditions. These include: Asthma. Alzheimer's disease. Stroke. Chronic pain. An overactive thyroid gland (hyperthyroidism). Other sleep disorders, such as restless legs syndrome and sleep apnea. Menopause. Sometimes, the cause of insomnia may not be known. What increases the risk? Risk factors for insomnia include: Gender. Females are affected more often than males. Age. Insomnia is more common as people get older. Stress and certain medical and mental health conditions. Lack of exercise. Having an irregular work schedule. This may include working night shifts and traveling between different time zones. What are the signs or symptoms? If you have insomnia, the main symptom is having trouble falling asleep or having trouble staying asleep. This may lead to other symptoms, such as: Feeling tired or having low energy. Feeling nervous about going to sleep. Not feeling rested in the morning. Having trouble concentrating. Feeling irritable, anxious, or depressed. How is this diagnosed? This condition  may be diagnosed based on: Your symptoms and medical history. Your health care provider may ask about: Your sleep habits. Any medical conditions you have. Your mental health. A physical exam. How is this treated? Treatment for insomnia depends on the cause. Treatment may focus on treating an underlying condition that is causing the insomnia. Treatment may also include: Medicines to help you sleep. Counseling or therapy. Lifestyle adjustments to help you sleep better. Follow these instructions at home: Eating and drinking  Limit or avoid alcohol, caffeinated beverages, and products that contain nicotine and tobacco, especially close to bedtime. These can disrupt your sleep. Do not eat a large meal or eat spicy foods right before bedtime. This can lead to digestive discomfort that can make it hard for you to sleep. Sleep habits  Keep a sleep diary to help you and your health care provider figure out what could be causing your insomnia. Write down: When you sleep. When you wake up during the night. How well you sleep and how rested you feel the next day. Any side effects of medicines you are taking. What you eat and drink. Make your bedroom a dark, comfortable place where it is easy to fall asleep. Put up shades or blackout curtains to block light from outside. Use a white noise machine to block noise. Keep the temperature cool. Limit screen use before bedtime. This includes: Not watching TV. Not using your smartphone, tablet, or computer. Stick to a routine that includes going to bed and waking up at the same times every day and night. This can help you fall asleep faster. Consider making a quiet activity, such as reading, part of your nighttime routine. Try to avoid taking naps during the day so that you sleep better at night. Get out of bed if you are still awake after   15 minutes of trying to sleep. Keep the lights down, but try reading or doing a quiet activity. When you feel  sleepy, go back to bed. General instructions Take over-the-counter and prescription medicines only as told by your health care provider. Exercise regularly as told by your health care provider. However, avoid exercising in the hours right before bedtime. Use relaxation techniques to manage stress. Ask your health care provider to suggest some techniques that may work well for you. These may include: Breathing exercises. Routines to release muscle tension. Visualizing peaceful scenes. Make sure that you drive carefully. Do not drive if you feel very sleepy. Keep all follow-up visits. This is important. Contact a health care provider if: You are tired throughout the day. You have trouble in your daily routine due to sleepiness. You continue to have sleep problems, or your sleep problems get worse. Get help right away if: You have thoughts about hurting yourself or someone else. Get help right away if you feel like you may hurt yourself or others, or have thoughts about taking your own life. Go to your nearest emergency room or: Call 911. Call the National Suicide Prevention Lifeline at 1-800-273-8255 or 988. This is open 24 hours a day. Text the Crisis Text Line at 741741. Summary Insomnia is a sleep disorder that makes it difficult to fall asleep or stay asleep. Insomnia can be long-term (chronic) or short-term (acute). Treatment for insomnia depends on the cause. Treatment may focus on treating an underlying condition that is causing the insomnia. Keep a sleep diary to help you and your health care provider figure out what could be causing your insomnia. This information is not intended to replace advice given to you by your health care provider. Make sure you discuss any questions you have with your health care provider. Document Revised: 10/14/2021 Document Reviewed: 10/14/2021 Elsevier Patient Education  2024 [employer]  

## 2023-06-02 NOTE — Progress Notes (Signed)
Subjective:    Patient ID: Autumn Davis, female    DOB: [birth year], 37 y.o.   MRN: [id number]   Chief Complaint: medical management of chronic issues     HPI:  Autumn Davis is a 37 y.o. who identifies as a female who was assigned female at birth.   Social history: Lives with: parents Work history: disability   Comes in today for follow up of the following chronic medical issues:  1. Mixed hyperlipidemia Does not watch diet. Does no dedicated exercise Lab Results  Component Value Date   CHOL 225 (H) 11/26/2022   HDL 72 11/26/2022   LDLCALC 112 (H) 11/26/2022   TRIG 239 (H) 11/26/2022   CHOLHDL 3.1 11/26/2022     2. Gastroesophageal reflux disease without esophagitis Is on omeprazole and pepcid an dis doing well.  3. Esophageal dysphagia Has not had any issues as of late  4. Irritable bowel syndrome with constipation Takes baclofen and elavil  that works well for her  5. Tachycardia, paroxysmal (HCC) Is on metoprolol which keeps heart rate down  6. Recurrent major depressive disorder, in full remission (HCC) Currently on no anti depressants    06/02/2023    3:43 PM 05/05/2023    9:49 AM 12/08/2022   11:19 AM  Depression screen PHQ 2/9  Decreased Interest 0 0 0  Down, Depressed, Hopeless 0 0 0  PHQ - 2 Score 0 0 0  Altered sleeping 3    Tired, decreased energy 1    Change in appetite 1    Feeling bad or failure about yourself  0    Trouble concentrating 0    Moving slowly or fidgety/restless 0    Suicidal thoughts 0    PHQ-9 Score 5    Difficult doing work/chores Somewhat difficult      7. Fibromyalgia 8. Inflammatory autoimmune disorder (HCC) 9. Systemic lupus erythematosus, unspecified SLE type, unspecified organ involvement status (HCC) Not really sure exactly what auto  immune disorder she has. Her test are inconclusive. She has lots of pain in which she takes neurontin for and see pain management. She feels like she is  maintaining. She has good days and bad days  10. Seizures (HCC) Is on diamox. Has no seizure activity to report  11. Urinary frequency Has occasional frequency. Is on flomax and myrbetriq- sees urology yearly  12. Vitamin D deficiency Is on daily vitamin d supplement  13. BMI 33.0-33.9,adult No recent weight changes Wt Readings from Last 3 Encounters:  06/02/23 231 lb (104.8 kg)  05/05/23 226 lb (102.5 kg)  05/05/23 228 lb (103.4 kg)   BMI Readings from Last 3 Encounters:  06/02/23 35.12 kg/m  05/05/23 34.36 kg/m  05/05/23 35.71 kg/m     New complaints: None  today  Allergies  Allergen Reactions   Ciprofloxacin Other (See Comments)    Muscle weakness and numbness  Other reaction(s): Not available  Muscle weakness and numbness Other reaction(s): Not available   Compazine Other (See Comments)    hallucinations   Prochlorperazine Other (See Comments) and Rash    Pt states it makes her feel like things are crawling on her Other reaction(s): Not available   Outpatient Encounter Medications as of 06/02/2023  Medication Sig   acetaZOLAMIDE (DIAMOX) 250 MG tablet TAKE 1 TABLET EVERY MORNING, TAKE 1 TABLET AT 2PM, AND TAKE 2 TABLETSAT BEDTIME   albuterol (VENTOLIN HFA) 108 (90 Base) MCG/ACT inhaler Inhale 2 puffs into the lungs every 4 (  four) hours as needed for wheezing or shortness of breath.   amitriptyline (ELAVIL) 75 MG tablet TAKE ONE TABLET BY MOUTH AT BEDTIME   Ascorbic Acid (VITAMIN C ADULT GUMMIES PO) Take 2 each by mouth daily.   atorvastatin (LIPITOR) 40 MG tablet TAKE ONE (1) TABLET BY MOUTH EVERY DAY   baclofen (LIORESAL) 20 MG tablet TAKE ONE TABLET FOUR TIMES DAILY   Beclomethasone Dipropionate (QNASL) 80 MCG/ACT AERS    Calcium-Magnesium-Vitamin D (CALCIUM 1200+D3 PO) Take 1 tablet by mouth daily.   cetirizine (ZYRTEC) 10 MG tablet Take 1 tablet (10 mg total) by mouth 2 (two) times daily as needed for allergies (Can take an extra dose during flare ups.).    cromolyn (OPTICROM) 4 % ophthalmic solution SMARTSIG:In Eye(s)   cyanocobalamin (VITAMIN B12) 1000 MCG/ML injection INJECT IM EVERY 30 DAYS   cycloSPORINE (RESTASIS) 0.05 % ophthalmic emulsion Place 1 drop into both eyes 2 (two) times daily.   diclofenac (VOLTAREN) 75 MG EC tablet TAKE ONE TABLET BY MOUTH TWICE DAILY   diclofenac Sodium (VOLTAREN) 1 % GEL Apply 4 g topically 4 (four) times daily.   docusate sodium (COLACE) 100 MG capsule Take 100 mg by mouth 2 (two) times daily. PRN   EPINEPHrine 0.3 mg/0.3 mL IJ SOAJ injection Inject 0.3 mg into the muscle as needed for anaphylaxis.   famotidine (PEPCID) 40 MG tablet Take 1 tablet (40 mg total) by mouth at bedtime.   furosemide (LASIX) 20 MG tablet TAKE ONE (1) TABLET BY MOUTH EVERY DAY   gabapentin (NEURONTIN) 600 MG tablet TAKE 1 TABLET BY MOUTH 3 TIMES DAILY & TAKE 2 TABLETS AT BEDTIME   ipratropium-albuterol (DUONEB) 0.5-2.5 (3) MG/3ML SOLN Take 3 mLs by nebulization every 4 (four) hours as needed.   L-Methylfolate 15 MG TABS Take 1 tablet (15 mg total) by mouth daily.   MAGNESIUM GLYCINATE PO Take 2 tablets by mouth daily.   metoprolol succinate (TOPROL-XL) 25 MG 24 hr tablet TAKE ONE (1) TABLET BY MOUTH EVERY DAY   mirabegron ER (MYRBETRIQ) 25 MG TB24 tablet Take 1 tablet (25 mg total) by mouth daily.   mometasone-formoterol (DULERA) 200-5 MCG/ACT AERO Inhale 2 puffs into the lungs in the morning and at bedtime.   montelukast (SINGULAIR) 10 MG tablet Take 1 tablet (10 mg total) by mouth at bedtime.   Multiple Vitamins-Minerals (ZINC PO) Take 1 tablet by mouth daily.   mupirocin ointment (BACTROBAN) 2 % APPLY A SMALL AMOUNT TO THE AFFECTED AREA BY TOPICAL ROUTE 3 TIMES PER DAY   norethindrone (MICRONOR) 0.35 MG tablet Take 1 tablet by mouth daily.   nystatin (MYCOSTATIN) 100000 UNIT/ML suspension Use as directed 5 mLs (500,000 Units total) in the mouth or throat 4 (four) times daily.   nystatin cream (MYCOSTATIN) Apply 1 application  topically 2 (two) times daily.   nystatin-triamcinolone ointment (MYCOLOG) Apply 1 application topically 2 (two) times daily. (Patient not taking: Reported on 05/05/2023)   Omega 3 1000 MG CAPS Take 1 capsule by mouth daily.   omeprazole (PRILOSEC) 40 MG capsule Take 1 capsule (40 mg total) by mouth 2 (two) times daily.   oxyCODONE-acetaminophen (PERCOCET) 7.5-325 MG tablet Take 1 tablet by mouth 5 (five) times daily as needed for moderate pain. No More Than 5 a day.   phenazopyridine (PYRIDIUM) 100 MG tablet Take 1 tablet (100 mg total) by mouth 3 (three) times daily as needed for pain. (Patient not taking: Reported on 05/05/2023)   predniSONE (DELTASONE) 5 MG tablet  TAKE ONE TABLET BY MOUTH EVERY MORNING   Probiotic Product (PROBIOTIC-10 PO) Take by mouth.   promethazine (PHENERGAN) 12.5 MG tablet TAKE 1 TABLET BY MOUTH EVERY 8 HOURS AS NEEDED FOR NAUSEA & VOMITING   sulfamethoxazole-trimethoprim (BACTRIM DS) 800-160 MG tablet Take 1 tablet by mouth 2 (two) times daily. (Patient not taking: Reported on 05/05/2023)   tamsulosin (FLOMAX) 0.4 MG CAPS capsule Take 1 capsule (0.4 mg total) by mouth daily after breakfast.   Vitamin D, Ergocalciferol, (DRISDOL) 1.25 MG (50000 UNIT) CAPS capsule TAKE 1 CAPSULE BY MOUTH EVERY 7 DAYS   vitamin E 180 MG (400 UNITS) capsule Take 1,000 Units by mouth daily.    No facility-administered encounter medications on file as of 06/02/2023.    Past Surgical History:  Procedure Laterality Date   74 HOUR PH STUDY N/A 10/29/2015   Procedure: 24 HOUR PH STUDY;  Surgeon: Iva Boop, MD;  Location: [facility];  Service: Endoscopy;  Laterality: N/A;   COLONOSCOPY     ESOPHAGEAL MANOMETRY N/A 10/29/2015   Procedure: ESOPHAGEAL MANOMETRY (EM);  Surgeon: Iva Boop, MD;  Location: [facility];  Service: Endoscopy;  Laterality: N/A;   KNEE SURGERY  2002/2003   bil   RECTAL SURGERY  correction of prolapse   2010    Family History  Problem Relation Age of Onset    Thyroid disease Mother    Colon polyps Father    Lung cancer Maternal Grandmother    Cancer Paternal Grandmother        colon/pancreatiec/lymphoma   Irritable bowel syndrome Brother    AAA (abdominal aortic aneurysm) Neg Hx       Controlled substance contract: n/a     Review of Systems  Constitutional:  Negative for diaphoresis.  Eyes:  Negative for pain.  Respiratory:  Negative for shortness of breath.   Cardiovascular:  Negative for chest pain, palpitations and leg swelling.  Gastrointestinal:  Negative for abdominal pain.  Endocrine: Negative for polydipsia.  Skin:  Negative for rash.  Neurological:  Negative for dizziness, weakness and headaches.  Hematological:  Does not bruise/bleed easily.  All other systems reviewed and are negative.      Objective:   Physical Exam Vitals and nursing note reviewed.  Constitutional:      General: She is not in acute distress.    Appearance: Normal appearance. She is well-developed.  HENT:     Head: Normocephalic.     Right Ear: Tympanic membrane normal.     Left Ear: Tympanic membrane normal.     Nose: Nose normal.     Mouth/Throat:     Mouth: Mucous membranes are moist.  Eyes:     Pupils: Pupils are equal, round, and reactive to light.  Neck:     Vascular: No carotid bruit or JVD.  Cardiovascular:     Rate and Rhythm: Normal rate and regular rhythm.     Heart sounds: Normal heart sounds.  Pulmonary:     Effort: Pulmonary effort is normal. No respiratory distress.     Breath sounds: Normal breath sounds. No wheezing or rales.  Chest:     Chest wall: No tenderness.  Abdominal:     General: Bowel sounds are normal. There is no distension or abdominal bruit.     Palpations: Abdomen is soft. There is no hepatomegaly, splenomegaly, mass or pulsatile mass.     Tenderness: There is no abdominal tenderness.  Musculoskeletal:        General: Normal range of  motion.     Cervical back: Normal range of motion and neck  supple.     Right lower leg: Edema (1+) present.     Left lower leg: Edema (1+) present.  Lymphadenopathy:     Cervical: No cervical adenopathy.  Skin:    General: Skin is warm and dry.  Neurological:     Mental Status: She is alert and oriented to person, place, and time.     Deep Tendon Reflexes: Reflexes are normal and symmetric.  Psychiatric:        Behavior: Behavior normal.        Thought Content: Thought content normal.        Judgment: Judgment normal.    BP 134/89   Pulse 82   Temp 98.1 F (36.7 C) (Temporal)   Resp 20   Ht 5\' 8"  (1.727 m)   Wt 231 lb (104.8 kg)   SpO2 99%   BMI 35.12 kg/m         Assessment & Plan:   Autumn Davis comes in today with chief complaint of Medical Management of Chronic Issues   Diagnosis and orders addressed:  1. Mixed hyperlipidemia Low fat diet - atorvastatin (LIPITOR) 40 MG tablet; TAKE ONE (1) TABLET BY MOUTH EVERY DAY  Dispense: 90 tablet; Refill: 1  2. Gastroesophageal reflux disease without esophagitis Avoid spicy foods Do not eat 2 hours prior to bedtime   3. Esophageal dysphagia Chew food well  4. Irritable bowel syndrome with constipation Increase fiber in diet - amitriptyline (ELAVIL) 75 MG tablet; Take 1 tablet (75 mg total) by mouth at bedtime.  Dispense: 90 tablet; Refill: 1 - baclofen (LIORESAL) 20 MG tablet; TAKE ONE TABLET FOUR TIMES DAILY  Dispense: 360 each; Refill: 1  5. Tachycardia, paroxysmal (HCC) Avoid caffeine - metoprolol succinate (TOPROL-XL) 25 MG 24 hr tablet; Take 1 tablet (25 mg total) by mouth daily.  Dispense: 90 tablet; Refill: 1  6. Recurrent major depressive disorder, in full remission [facility]) Stress management  7. Fibromyalgia Keep follow up pain management  8. Inflammatory autoimmune disorder (HCC) Keep follow up with neurology - gabapentin (NEURONTIN) 600 MG tablet; TAKE 1 TABLET BY MOUTH 3 TIMES DAILY & TAKE 2 TABLETS AT BEDTIME  Dispense: 450 tablet; Refill: 1 -  predniSONE (DELTASONE) 5 MG tablet; Take 1 tablet (5 mg total) by mouth every morning.  Dispense: 90 tablet; Refill: 1  9. Systemic lupus erythematosus, unspecified SLE type, unspecified organ involvement status (HCC) Keep follow up with specialist  10. Seizures (HCC) Report ant seizure activity - acetaZOLAMIDE (DIAMOX) 250 MG tablet; TAKE 1 TABLET EVERY MORNING, TAKE 1 TABLET AT 2PM, AND TAKE 2 TABLETSAT BEDTIME  Dispense: 360 tablet; Refill: 1  11. Urinary frequency Force fluids - tamsulosin (FLOMAX) 0.4 MG CAPS capsule; Take 1 capsule (0.4 mg total) by mouth daily after breakfast.  Dispense: 90 capsule; Refill: 1  12. Vitamin D deficiency Continue vitamin d supplement  13. BMI 33.0-33.9,adult Discussed diet and exercise for person with BMI >25 Will recheck weight in 3-6 months   14. Peripheral edema Elevate legs when sitting - furosemide (LASIX) 20 MG tablet; Take 1 tablet (20 mg total) by mouth daily.  Dispense: 90 tablet; Refill: 1   Labs pending Health Maintenance reviewed Diet and exercise encouraged  Follow up plan: 6 months   Mary-Margaret Daphine Deutscher, FNP

## 2023-06-03 LAB — CBC WITH DIFFERENTIAL/PLATELET
Basophils Absolute: 0.1 10*3/uL (ref 0.0–0.2)
Basos: 1 %
EOS (ABSOLUTE): 0.1 10*3/uL (ref 0.0–0.4)
Eos: 1 %
Hematocrit: 32.7 % — ABNORMAL LOW (ref 34.0–46.6)
Hemoglobin: 10.4 g/dL — ABNORMAL LOW (ref 11.1–15.9)
Immature Grans (Abs): 0.1 10*3/uL (ref 0.0–0.1)
Immature Granulocytes: 1 %
Lymphocytes Absolute: 2 10*3/uL (ref 0.7–3.1)
Lymphs: 24 %
MCH: 26.9 pg (ref 26.6–33.0)
MCHC: 31.8 g/dL (ref 31.5–35.7)
MCV: 85 fL (ref 79–97)
Monocytes Absolute: 0.5 10*3/uL (ref 0.1–0.9)
Monocytes: 6 %
Neutrophils Absolute: 5.8 10*3/uL (ref 1.4–7.0)
Neutrophils: 67 %
Platelets: 312 10*3/uL (ref 150–450)
RBC: 3.86 x10E6/uL (ref 3.77–5.28)
RDW: 14.4 % (ref 11.7–15.4)
WBC: 8.6 10*3/uL (ref 3.4–10.8)

## 2023-06-03 LAB — CMP14+EGFR
ALT: 16 IU/L (ref 0–32)
AST: 16 IU/L (ref 0–40)
Albumin: 4.3 g/dL (ref 3.9–4.9)
Alkaline Phosphatase: 76 IU/L (ref 44–121)
BUN/Creatinine Ratio: 11 (ref 9–23)
BUN: 8 mg/dL (ref 6–20)
Bilirubin Total: 0.2 mg/dL (ref 0.0–1.2)
CO2: 18 mmol/L — ABNORMAL LOW (ref 20–29)
Calcium: 9 mg/dL (ref 8.7–10.2)
Chloride: 110 mmol/L — ABNORMAL HIGH (ref 96–106)
Creatinine, Ser: 0.74 mg/dL (ref 0.57–1.00)
Globulin, Total: 2.5 g/dL (ref 1.5–4.5)
Glucose: 101 mg/dL — ABNORMAL HIGH (ref 70–99)
Potassium: 3.8 mmol/L (ref 3.5–5.2)
Sodium: 141 mmol/L (ref 134–144)
Total Protein: 6.8 g/dL (ref 6.0–8.5)
eGFR: 105 mL/min/{1.73_m2} (ref >59)

## 2023-06-03 LAB — LIPID PANEL
Chol/HDL Ratio: 2.9 ratio (ref 0.0–4.4)
Cholesterol, Total: 168 mg/dL (ref 100–199)
HDL: 58 mg/dL (ref >39)
LDL Chol Calc (NIH): 84 mg/dL (ref 0–99)
Triglycerides: 152 mg/dL — ABNORMAL HIGH (ref 0–149)
VLDL Cholesterol Cal: 26 mg/dL (ref 5–40)

## 2023-06-06 DIAGNOSIS — N132 Hydronephrosis with renal and ureteral calculous obstruction: Secondary | ICD-10-CM | POA: Diagnosis not present

## 2023-06-06 DIAGNOSIS — D72829 Elevated white blood cell count, unspecified: Secondary | ICD-10-CM | POA: Diagnosis not present

## 2023-06-06 DIAGNOSIS — I10 Essential (primary) hypertension: Secondary | ICD-10-CM | POA: Diagnosis not present

## 2023-06-06 DIAGNOSIS — N200 Calculus of kidney: Secondary | ICD-10-CM | POA: Diagnosis not present

## 2023-06-06 DIAGNOSIS — K429 Umbilical hernia without obstruction or gangrene: Secondary | ICD-10-CM | POA: Diagnosis not present

## 2023-06-06 DIAGNOSIS — E876 Hypokalemia: Secondary | ICD-10-CM | POA: Diagnosis not present

## 2023-06-06 DIAGNOSIS — R1032 Left lower quadrant pain: Secondary | ICD-10-CM | POA: Diagnosis not present

## 2023-06-06 DIAGNOSIS — K4090 Unilateral inguinal hernia, without obstruction or gangrene, not specified as recurrent: Secondary | ICD-10-CM | POA: Diagnosis not present

## 2023-06-06 DIAGNOSIS — Z881 Allergy status to other antibiotic agents status: Secondary | ICD-10-CM | POA: Diagnosis not present

## 2023-06-06 DIAGNOSIS — R109 Unspecified abdominal pain: Secondary | ICD-10-CM | POA: Diagnosis not present

## 2023-06-08 ENCOUNTER — Encounter (HOSPITAL_BASED_OUTPATIENT_CLINIC_OR_DEPARTMENT_OTHER): Payer: Self-pay

## 2023-06-08 ENCOUNTER — Emergency Department (HOSPITAL_BASED_OUTPATIENT_CLINIC_OR_DEPARTMENT_OTHER)
Admission: EM | Admit: 2023-06-08 | Discharge: 2023-06-09 | Disposition: A | Discharge status: Home / Self Care | Payer: Medicare Other | Attending: Emergency Medicine | Admitting: Emergency Medicine

## 2023-06-08 ENCOUNTER — Ambulatory Visit (HOSPITAL_COMMUNITY)
Admission: RE | Admit: 2023-06-08 | Discharge: 2023-06-08 | Disposition: A | Discharge status: Home / Self Care | Payer: Medicare Other | Source: Ambulatory Visit | Attending: Urology | Admitting: Urology

## 2023-06-08 ENCOUNTER — Other Ambulatory Visit: Payer: Self-pay

## 2023-06-08 DIAGNOSIS — N200 Calculus of kidney: Secondary | ICD-10-CM

## 2023-06-08 DIAGNOSIS — R220 Localized swelling, mass and lump, head: Secondary | ICD-10-CM | POA: Diagnosis present

## 2023-06-08 DIAGNOSIS — Z79899 Other long term (current) drug therapy: Secondary | ICD-10-CM | POA: Insufficient documentation

## 2023-06-08 DIAGNOSIS — T7840XA Allergy, unspecified, initial encounter: Secondary | ICD-10-CM | POA: Diagnosis not present

## 2023-06-08 MED ORDER — SODIUM CHLORIDE 0.9 % IV BOLUS
1000.0000 mL | Freq: Once | INTRAVENOUS | Status: AC
  Administered 2023-06-08: 1000 mL via INTRAVENOUS

## 2023-06-08 MED ORDER — DIPHENHYDRAMINE HCL 50 MG/ML IJ SOLN
25.0000 mg | Freq: Once | INTRAMUSCULAR | Status: AC
  Administered 2023-06-08: 25 mg via INTRAVENOUS
  Filled 2023-06-08: qty 1

## 2023-06-08 MED ORDER — EPINEPHRINE 0.3 MG/0.3ML IJ SOAJ
0.3000 mg | Freq: Once | INTRAMUSCULAR | Status: AC
  Administered 2023-06-08: 0.3 mg via INTRAMUSCULAR
  Filled 2023-06-08: qty 0.3

## 2023-06-08 MED ORDER — FAMOTIDINE IN NACL 20-0.9 MG/50ML-% IV SOLN
20.0000 mg | Freq: Once | INTRAVENOUS | Status: AC
  Administered 2023-06-08: 20 mg via INTRAVENOUS
  Filled 2023-06-08: qty 50

## 2023-06-08 MED ORDER — METHYLPREDNISOLONE SODIUM SUCC 125 MG IJ SOLR
125.0000 mg | Freq: Once | INTRAMUSCULAR | Status: AC
  Administered 2023-06-08: 125 mg via INTRAVENOUS
  Filled 2023-06-08: qty 2

## 2023-06-08 NOTE — ED Provider Notes (Incomplete)
[place] EMERGENCY DEPARTMENT AT [facility]  Provider Note  CSN: [id number] Arrival date & time: 06/08/23 2248  History Chief Complaint  Patient presents with   Facial Swelling    Sultana Tierney is a 37 y.o. female with a history of multiple medical problems on a long list of chronic medications was in the [place] ED 2 days ago for kidney stones. She had her flomax changed to uroxatrol and added zofran to her usual phenergan but otherwise no medication changes She reports during the day she began having some puffiness around her eyes that progressed to her cheeks, tongue and lip swelling this evening. She reports some improvement since she decided to come to the ED for evaluation. No SOB or difficulty swallowing, but she reports a dry mouth. She was brought by her parents who help supplement her history.    Home Medications Prior to Admission medications   Medication Sig Start Date End Date Taking? Authorizing Provider  acetaZOLAMIDE (DIAMOX) 250 MG tablet TAKE 1 TABLET EVERY MORNING, TAKE 1 TABLET AT 2PM, AND TAKE 2 TABLETSAT BEDTIME 06/02/23   Daphine Deutscher, Mary-Margaret, FNP  albuterol (VENTOLIN HFA) 108 (90 Base) MCG/ACT inhaler Inhale 2 puffs into the lungs every 4 (four) hours as needed for wheezing or shortness of breath. 05/05/23   Kozlow, Alvira Philips, MD  amitriptyline (ELAVIL) 75 MG tablet Take 1 tablet (75 mg total) by mouth at bedtime. 06/02/23   Daphine Deutscher, Mary-Margaret, FNP  Ascorbic Acid (VITAMIN C ADULT GUMMIES PO) Take 2 each by mouth daily.    [provider]  atorvastatin (LIPITOR) 40 MG tablet TAKE ONE (1) TABLET BY MOUTH EVERY DAY 06/02/23   [name], FNP  baclofen (LIORESAL) 20 MG tablet TAKE ONE TABLET FOUR TIMES DAILY 06/02/23   [name], FNP  Beclomethasone Dipropionate (QNASL) 80 MCG/ACT AERS     [provider]  Calcium-Magnesium-Vitamin D (CALCIUM 1200+D3 PO) Take 1 tablet by mouth daily.    [provider]   cetirizine (ZYRTEC) 10 MG tablet Take 1 tablet (10 mg total) by mouth 2 (two) times daily as needed for allergies (Can take an extra dose during flare ups.). 09/02/22   Kozlow, Alvira Philips, MD  cromolyn (OPTICROM) 4 % ophthalmic solution SMARTSIG:In Eye(s) 06/12/22   [provider]  cyanocobalamin (VITAMIN B12) 1000 MCG/ML injection INJECT IM EVERY 30 DAYS 05/01/23   Daphine Deutscher, Mary-Margaret, FNP  cycloSPORINE (RESTASIS) 0.05 % ophthalmic emulsion Place 1 drop into both eyes 2 (two) times daily.    [provider]  diclofenac (VOLTAREN) 75 MG EC tablet TAKE ONE TABLET BY MOUTH TWICE DAILY 04/20/23   Daphine Deutscher, Mary-Margaret, FNP  diclofenac Sodium (VOLTAREN) 1 % GEL Apply 4 g topically 4 (four) times daily. 06/18/21   Daphine Deutscher, Mary-Margaret, FNP  docusate sodium (COLACE) 100 MG capsule Take 100 mg by mouth 2 (two) times daily. PRN    [provider]  EPINEPHrine 0.3 mg/0.3 mL IJ SOAJ injection Inject 0.3 mg into the muscle as needed for anaphylaxis. 09/02/22   Kozlow, Alvira Philips, MD  famotidine (PEPCID) 40 MG tablet Take 1 tablet (40 mg total) by mouth at bedtime. 05/05/23   Kozlow, Alvira Philips, MD  furosemide (LASIX) 20 MG tablet Take 1 tablet (20 mg total) by mouth daily. 06/02/23   Daphine Deutscher, Mary-Margaret, FNP  gabapentin (NEURONTIN) 600 MG tablet TAKE 1 TABLET BY MOUTH 3 TIMES DAILY & TAKE 2 TABLETS AT BEDTIME 06/02/23   Daphine Deutscher, Mary-Margaret, FNP  ipratropium-albuterol (DUONEB) 0.5-2.5 (3)  MG/3ML SOLN Take 3 mLs by nebulization every 4 (four) hours as needed. 11/20/20   Daphine Deutscher, Mary-Margaret, FNP  L-Methylfolate 15 MG TABS Take 1 tablet (15 mg total) by mouth daily. 09/13/18   Daphine Deutscher, Mary-Margaret, FNP  MAGNESIUM GLYCINATE PO Take 2 tablets by mouth daily.    [provider]  metoprolol succinate (TOPROL-XL) 25 MG 24 hr tablet Take 1 tablet (25 mg total) by mouth daily. 06/02/23   Daphine Deutscher, Mary-Margaret, FNP  mirabegron ER (MYRBETRIQ) 25 MG TB24 tablet Take 1 tablet (25 mg total) by mouth  daily. 11/26/22   Daphine Deutscher, Mary-Margaret, FNP  mometasone-formoterol (DULERA) 200-5 MCG/ACT AERO Inhale 2 puffs into the lungs in the morning and at bedtime. 05/05/23   Kozlow, Alvira Philips, MD  montelukast (SINGULAIR) 10 MG tablet Take 1 tablet (10 mg total) by mouth at bedtime. 05/05/23   Kozlow, Alvira Philips, MD  Multiple Vitamins-Minerals (ZINC PO) Take 1 tablet by mouth daily.    [provider]  mupirocin ointment (BACTROBAN) 2 % APPLY A SMALL AMOUNT TO THE AFFECTED AREA BY TOPICAL ROUTE 3 TIMES PER DAY    [provider]  norethindrone (MICRONOR) 0.35 MG tablet Take 1 tablet by mouth daily. 12/04/22   [provider]  nystatin (MYCOSTATIN) 100000 UNIT/ML suspension Use as directed 5 mLs (500,000 Units total) in the mouth or throat 4 (four) times daily. 05/12/19   Daphine Deutscher, Mary-Margaret, FNP  nystatin cream (MYCOSTATIN) Apply 1 application topically 2 (two) times daily. 09/19/21   [name], FNP  nystatin-triamcinolone ointment (MYCOLOG) Apply 1 application topically 2 (two) times daily. 07/10/16   Daphine Deutscher, Mary-Margaret, FNP  Omega 3 1000 MG CAPS Take 1 capsule by mouth daily.    [provider]  omeprazole (PRILOSEC) 40 MG capsule Take 1 capsule (40 mg total) by mouth 2 (two) times daily. 05/05/23   Kozlow, Alvira Philips, MD  oxyCODONE-acetaminophen (PERCOCET) 7.5-325 MG tablet Take 1 tablet by mouth 5 (five) times daily as needed for moderate pain. No More Than 5 a day. 04/24/23   Jones Bales, NP  phenazopyridine (PYRIDIUM) 100 MG tablet Take 1 tablet (100 mg total) by mouth 3 (three) times daily as needed for pain. 04/15/23   Margaretann Loveless, PA-C  predniSONE (DELTASONE) 5 MG tablet Take 1 tablet (5 mg total) by mouth every morning. 06/02/23   Daphine Deutscher Mary-Margaret, FNP  Probiotic Product (PROBIOTIC-10 PO) Take by mouth.    [provider]  promethazine (PHENERGAN) 12.5 MG tablet TAKE 1 TABLET BY MOUTH EVERY 8 HOURS AS NEEDED FOR NAUSEA & VOMITING 12/23/22    [name], FNP  tamsulosin (FLOMAX) 0.4 MG CAPS capsule Take 1 capsule (0.4 mg total) by mouth daily after breakfast. 06/02/23   Daphine Deutscher, Mary-Margaret, FNP  Vitamin D, Ergocalciferol, (DRISDOL) 1.25 MG (50000 UNIT) CAPS capsule TAKE 1 CAPSULE BY MOUTH EVERY 7 DAYS 03/19/23   [name], FNP  vitamin E 180 MG (400 UNITS) capsule Take 1,000 Units by mouth daily.     [provider]     Allergies    Ciprofloxacin, Compazine, and Prochlorperazine   Review of Systems   Review of Systems Please see HPI for pertinent positives and negatives  Physical Exam BP 114/68   Pulse 89   Temp 98.4 F (36.9 C) (Oral)   Resp 14   Ht 5\' 8"  (1.727 m)   Wt 104.3 kg   SpO2 95%   BMI 34.97 kg/m   Physical Exam Vitals and nursing note reviewed.  Constitutional:  Appearance: Normal appearance.  HENT:     Head: Normocephalic and atraumatic.     Nose: Nose normal.     Mouth/Throat:     Mouth: Mucous membranes are dry.     Comments: Mild angioedema of tongue and lips Eyes:     Extraocular Movements: Extraocular movements intact.     Conjunctiva/sclera: Conjunctivae normal.     Comments: Mild periorbital edema  Cardiovascular:     Rate and Rhythm: Normal rate.  Pulmonary:     Effort: Pulmonary effort is normal.     Breath sounds: Normal breath sounds. No stridor. No wheezing or rales.  Abdominal:     General: Abdomen is flat.     Palpations: Abdomen is soft.     Tenderness: There is no abdominal tenderness.  Musculoskeletal:        General: No swelling. Normal range of motion.     Cervical back: Neck supple.  Skin:    General: Skin is warm and dry.  Neurological:     General: No focal deficit present.     Mental Status: She is alert.  Psychiatric:        Mood and Affect: Mood normal.     ED Results / Procedures / Treatments   EKG None  Procedures .Critical Care  Performed by: Pollyann Savoy, MD Authorized by: Pollyann Savoy, MD    Critical care provider statement:    Critical care time (minutes):  30   Critical care time was exclusive of:  Separately billable procedures and treating other patients   Critical care was time spent personally by me on the following activities:  Development of treatment plan with patient or surrogate, discussions with consultants, evaluation of patient's response to treatment, examination of patient, ordering and review of laboratory studies, ordering and review of radiographic studies, ordering and performing treatments and interventions, pulse oximetry, re-evaluation of patient's condition and review of old charts   Medications Ordered in the ED Medications  diphenhydrAMINE (BENADRYL) injection 25 mg (25 mg Intravenous Given 06/08/23 2317)  methylPREDNISolone sodium succinate (SOLU-MEDROL) 125 mg/2 mL injection 125 mg (125 mg Intravenous Given 06/08/23 2317)  famotidine (PEPCID) IVPB 20 mg premix (0 mg Intravenous Stopped 06/08/23 2352)  EPINEPHrine (EPI-PEN) injection 0.3 mg (0.3 mg Intramuscular Given 06/08/23 2315)  sodium chloride 0.9 % bolus 1,000 mL (0 mLs Intravenous Stopped 06/08/23 2352)    Initial Impression and Plan  Patient here with possible allergic reaction including face and tongue. No rash. No clear instigating factors, not on ACE/ARB. Will treat with steroids, antihistamines and epi.   ED Course   Clinical Course as of 06/09/23 0325  Tue Jun 09, 2023  0231 Patient sleeping soundly, swelling improved. Will continue to monitor for 4 hours post-epipen.  [CS]    Clinical Course User Index [CS] Pollyann Savoy, MD     MDM Rules/Calculators/A&P Medical Decision Making Problems Addressed: Allergic reaction, initial encounter: acute illness or injury  Risk Prescription drug management.     Final Clinical Impression(s) / ED Diagnoses Final diagnoses:  Allergic reaction, initial encounter    Rx / DC Orders ED Discharge Orders     None        Pollyann Savoy, MD 06/09/23 [phone]

## 2023-06-08 NOTE — ED Triage Notes (Signed)
Patient here POV from Home.  Endorses waking up at 0800 today with some Eyelid swelling that has since progressed to some tongue and facial swelling.  History of recent renal stones and has been taking a few new medications for same.   NAD Noted during Triage. A&Ox4. GCS 15. BIB Wheelchair.

## 2023-06-08 NOTE — Progress Notes (Deleted)
Name: Autumn Davis DOB: Feb 22, 1986 MRN: [id number]  History of Present Illness: Autumn Davis is a 37 y.o. female who presents today for follow up visit at [facility]. - GU History: 1. Kidney stones. 2. Urinary frequency.  At last visit with Dr. Pete Glatter on 09/15/2022: The plan was:  - Continue stone prevention  - Continue Myrbetriq 25 mg daily.  Samples and rx given. - Continue tamsulosin - Return to office in 3 months  Since last visit: 06/06/2023: Seen in ER for left LLQ pain. CMP with normal renal function (GFR 79; creatinine 0.94). CBC with leukocytosis (WBC 15.0). UA showed 25 RBC/hpf. CT showed  1. Two small calculi in the proximal left ureter near the  ureteropelvic junction measuring 0.4 and 0.3 cm, with mild associated hydronephrosis.  2. Additional nonobstructive bilateral renal calculi.   She was discharged with prescriptions for Alfuzosin and Zofran. She routinely receives Percocet 7.5-325 mg as prescribed elsewhere.  Today: ***KUB today: Awaiting radiology read; ***  She {Actions; denies-reports:120008} flank pain. She {Actions; denies-reports:120008} abdominal pain. She {Actions; denies-reports:120008} fevers. She {Actions; denies-reports:120008} nausea/ vomiting. She states symptoms were first noticed *** and have been ***constant / intermittent / progressively worsening / improving***.  She urinates *** times per day. She reports *** urinary stream. She {Actions; denies-reports:120008} urgency. She {Actions; denies-reports:120008} dysuria. She {Actions; denies-reports:120008} gross hematuria. She {Actions; denies-reports:120008} the need to strain to void. She {Actions; denies-reports:120008} sensations of incomplete emptying.   Fall Screening: Do you usually have a device to assist in your mobility? {yes/no:20286}  ***cane / ***walker / ***wheelchair  Medications: Current Outpatient Medications  Medication Sig Dispense  Refill   acetaZOLAMIDE (DIAMOX) 250 MG tablet TAKE 1 TABLET EVERY MORNING, TAKE 1 TABLET AT 2PM, AND TAKE 2 TABLETSAT BEDTIME 360 tablet 1   albuterol (VENTOLIN HFA) 108 (90 Base) MCG/ACT inhaler Inhale 2 puffs into the lungs every 4 (four) hours as needed for wheezing or shortness of breath. 18 g 1   amitriptyline (ELAVIL) 75 MG tablet Take 1 tablet (75 mg total) by mouth at bedtime. 90 tablet 1   Ascorbic Acid (VITAMIN C ADULT GUMMIES PO) Take 2 each by mouth daily.     atorvastatin (LIPITOR) 40 MG tablet TAKE ONE (1) TABLET BY MOUTH EVERY DAY 90 tablet 1   baclofen (LIORESAL) 20 MG tablet TAKE ONE TABLET FOUR TIMES DAILY 360 each 1   Beclomethasone Dipropionate (QNASL) 80 MCG/ACT AERS      Calcium-Magnesium-Vitamin D (CALCIUM 1200+D3 PO) Take 1 tablet by mouth daily.     cetirizine (ZYRTEC) 10 MG tablet Take 1 tablet (10 mg total) by mouth 2 (two) times daily as needed for allergies (Can take an extra dose during flare ups.). 60 tablet 5   cromolyn (OPTICROM) 4 % ophthalmic solution SMARTSIG:In Eye(s)     cyanocobalamin (VITAMIN B12) 1000 MCG/ML injection INJECT IM EVERY 30 DAYS 3 mL 0   cycloSPORINE (RESTASIS) 0.05 % ophthalmic emulsion Place 1 drop into both eyes 2 (two) times daily.     diclofenac (VOLTAREN) 75 MG EC tablet TAKE ONE TABLET BY MOUTH TWICE DAILY 180 tablet 0   diclofenac Sodium (VOLTAREN) 1 % GEL Apply 4 g topically 4 (four) times daily. 350 g 1   docusate sodium (COLACE) 100 MG capsule Take 100 mg by mouth 2 (two) times daily. PRN     EPINEPHrine 0.3 mg/0.3 mL IJ SOAJ injection Inject 0.3 mg into the muscle as needed for anaphylaxis. 2 each  2   famotidine (PEPCID) 40 MG tablet Take 1 tablet (40 mg total) by mouth at bedtime. 30 tablet 5   furosemide (LASIX) 20 MG tablet Take 1 tablet (20 mg total) by mouth daily. 90 tablet 1   gabapentin (NEURONTIN) 600 MG tablet TAKE 1 TABLET BY MOUTH 3 TIMES DAILY & TAKE 2 TABLETS AT BEDTIME 450 tablet 1   ipratropium-albuterol  (DUONEB) 0.5-2.5 (3) MG/3ML SOLN Take 3 mLs by nebulization every 4 (four) hours as needed. 360 mL 4   L-Methylfolate 15 MG TABS Take 1 tablet (15 mg total) by mouth daily. 90 tablet 1   MAGNESIUM GLYCINATE PO Take 2 tablets by mouth daily.     metoprolol succinate (TOPROL-XL) 25 MG 24 hr tablet Take 1 tablet (25 mg total) by mouth daily. 90 tablet 1   mirabegron ER (MYRBETRIQ) 25 MG TB24 tablet Take 1 tablet (25 mg total) by mouth daily. 30 tablet 11   mometasone-formoterol (DULERA) 200-5 MCG/ACT AERO Inhale 2 puffs into the lungs in the morning and at bedtime. 13 g 5   montelukast (SINGULAIR) 10 MG tablet Take 1 tablet (10 mg total) by mouth at bedtime. 90 tablet 1   Multiple Vitamins-Minerals (ZINC PO) Take 1 tablet by mouth daily.     mupirocin ointment (BACTROBAN) 2 % APPLY A SMALL AMOUNT TO THE AFFECTED AREA BY TOPICAL ROUTE 3 TIMES PER DAY     norethindrone (MICRONOR) 0.35 MG tablet Take 1 tablet by mouth daily.     nystatin (MYCOSTATIN) 100000 UNIT/ML suspension Use as directed 5 mLs (500,000 Units total) in the mouth or throat 4 (four) times daily. 60 mL 2   nystatin cream (MYCOSTATIN) Apply 1 application topically 2 (two) times daily. 30 g 2   nystatin-triamcinolone ointment (MYCOLOG) Apply 1 application topically 2 (two) times daily. 30 g 0   Omega 3 1000 MG CAPS Take 1 capsule by mouth daily.     omeprazole (PRILOSEC) 40 MG capsule Take 1 capsule (40 mg total) by mouth 2 (two) times daily. 60 capsule 5   oxyCODONE-acetaminophen (PERCOCET) 7.5-325 MG tablet Take 1 tablet by mouth 5 (five) times daily as needed for moderate pain. No More Than 5 a day. 140 tablet 0   phenazopyridine (PYRIDIUM) 100 MG tablet Take 1 tablet (100 mg total) by mouth 3 (three) times daily as needed for pain. 10 tablet 0   predniSONE (DELTASONE) 5 MG tablet Take 1 tablet (5 mg total) by mouth every morning. 90 tablet 1   Probiotic Product (PROBIOTIC-10 PO) Take by mouth.     promethazine (PHENERGAN) 12.5 MG  tablet TAKE 1 TABLET BY MOUTH EVERY 8 HOURS AS NEEDED FOR NAUSEA & VOMITING 30 tablet 0   tamsulosin (FLOMAX) 0.4 MG CAPS capsule Take 1 capsule (0.4 mg total) by mouth daily after breakfast. 90 capsule 1   Vitamin D, Ergocalciferol, (DRISDOL) 1.25 MG (50000 UNIT) CAPS capsule TAKE 1 CAPSULE BY MOUTH EVERY 7 DAYS 12 capsule 0   vitamin E 180 MG (400 UNITS) capsule Take 1,000 Units by mouth daily.      No current facility-administered medications for this visit.    Allergies: Allergies  Allergen Reactions   Ciprofloxacin Other (See Comments)    Muscle weakness and numbness  Other reaction(s): Not available  Muscle weakness and numbness Other reaction(s): Not available   Compazine Other (See Comments)    hallucinations   Prochlorperazine Other (See Comments) and Rash    Pt states it makes her feel like things  are crawling on her Other reaction(s): Not available    Past Medical History:  Diagnosis Date   Allergy    Arthritis    HANDS,HIPS,KNEES   Asthma    Bronchitis, chronic/intermittent 01/22/2012   Depression 01/22/2012   GERD (gastroesophageal reflux disease)    Hyperlipidemia    IBS (irritable bowel syndrome) 01/22/2012   Lupus (HCC)    Neuromuscular disorder (HCC)    Osteoporosis    Seizures (HCC)    SOB (shortness of breath)    Thyroid disease    Past Surgical History:  Procedure Laterality Date   63 HOUR PH STUDY N/A 10/29/2015   Procedure: 24 HOUR PH STUDY;  Surgeon: Iva Boop, MD;  Location: [facility];  Service: Endoscopy;  Laterality: N/A;   COLONOSCOPY     ESOPHAGEAL MANOMETRY N/A 10/29/2015   Procedure: ESOPHAGEAL MANOMETRY (EM);  Surgeon: Iva Boop, MD;  Location: [facility];  Service: Endoscopy;  Laterality: N/A;   KNEE SURGERY  2002/2003   bil   RECTAL SURGERY  correction of prolapse   2010   Family History  Problem Relation Age of Onset   Thyroid disease Mother    Colon polyps Father    Lung cancer Maternal Grandmother    Cancer  Paternal Grandmother        colon/pancreatiec/lymphoma   Irritable bowel syndrome Brother    AAA (abdominal aortic aneurysm) Neg Hx    Social History   Socioeconomic History   Marital status: Legally Separated    Spouse name: Not on file   Number of children: 1   Years of education: Not on file   Highest education level: Not on file  Occupational History   Occupation: disabled    Employer: [employer]  Tobacco Use   Smoking status: Never   Smokeless tobacco: Never  Vaping Use   Vaping status: Never Used  Substance and Sexual Activity   Alcohol use: No    Alcohol/week: 0.0 standard drinks of alcohol   Drug use: No   Sexual activity: Not on file  Other Topics Concern   Not on file  Social History Narrative   She lives alone with her 83 year old son - right now - they are living with her parents right now   Social Determinants of Health   Financial Resource Strain: Low Risk  (05/05/2023)   Overall Financial Resource Strain (CARDIA)    Difficulty of Paying Living Expenses: Not hard at all  Food Insecurity: No Food Insecurity (05/05/2023)   Hunger Vital Sign    Worried About Running Out of Food in the Last Year: Never true    Ran Out of Food in the Last Year: Never true  Transportation Needs: No Transportation Needs (05/05/2023)   PRAPARE - Administrator, Civil Service (Medical): No    Lack of Transportation (Non-Medical): No  Physical Activity: Insufficiently Active (05/05/2023)   Exercise Vital Sign    Days of Exercise per Week: 2 days    Minutes of Exercise per Session: 30 min  Stress: No Stress Concern Present (05/05/2023)   [employer] of Occupational Health - Occupational Stress Questionnaire    Feeling of Stress : Not at all  Social Connections: Moderately Isolated (05/05/2023)   Social Connection and Isolation Panel [NHANES]    Frequency of Communication with Friends and Family: More than three times a week    Frequency of Social  Gatherings with Friends and Family: More than three times a week  Attends Religious Services: More than 4 times per year    Active Member of Clubs or Organizations: No    Attends Banker Meetings: Never    Marital Status: Separated  Intimate Partner Violence: Not At Risk (05/05/2023)   Humiliation, Afraid, Rape, and Kick questionnaire    Fear of Current or Ex-Partner: No    Emotionally Abused: No    Physically Abused: No    Sexually Abused: No    SUBJECTIVE  Review of Systems Constitutional: Patient ***denies any unintentional weight loss or change in strength lntegumentary: Patient ***denies any rashes or pruritus Eyes: Patient denies ***dry eyes ENT: Patient ***denies dry mouth Cardiovascular: Patient ***denies chest pain or syncope Respiratory: Patient ***denies shortness of breath Gastrointestinal: Patient ***denies nausea, vomiting, constipation, or diarrhea Musculoskeletal: Patient ***denies muscle cramps or weakness Neurologic: Patient ***denies convulsions or seizures Psychiatric: Patient ***denies memory problems Allergic/Immunologic: Patient ***denies recent allergic reaction(s) Hematologic/Lymphatic: Patient denies bleeding tendencies Endocrine: Patient ***denies heat/cold intolerance  GU: As per HPI.  OBJECTIVE There were no vitals filed for this visit. There is no height or weight on file to calculate BMI.  Physical Examination  Constitutional: ***No obvious distress; patient is ***non-toxic appearing  Cardiovascular: ***No visible lower extremity edema.  Respiratory: The patient does ***not have audible wheezing/stridor; respirations do ***not appear labored  Gastrointestinal: Abdomen ***non-distended Musculoskeletal: ***Normal ROM of UEs  Skin: ***No obvious rashes/open sores  Neurologic: CN 2-12 grossly ***intact Psychiatric: Answered questions ***appropriately with ***normal affect  Hematologic/Lymphatic/Immunologic: ***No obvious bruises or  sites of spontaneous bleeding  UA: {Desc; negative/positive:13464} for *** WBC/hpf, *** RBC/hpf, bacteria (***) PVR: *** ml  ASSESSMENT No diagnosis found.  ***We reviewed recent imaging results; ***awaiting radiology results, appears to have ***no acute findings.  ***For stone prevention: Advised adequate hydration and we discussed option to consider low oxalate diet given that calcium oxalate is the most common type of stone. Handout provided about stone prevention diet.  ***For recurrent stone formers: We discussed option to proceed with 24 hour urinalysis ([facility]) for metabolic evaluation, which may help with targeted recommendations for dietary I medication therapies for stone prevention. Patient elected to ***proceed/ ***hold off.  Will plan to follow up in ***6 months / ***1 year with ***KUB ***RUS for stone surveillance or sooner if needed.  Pt verbalized understanding and agreement. All questions were answered.  PLAN Advised the following: ***Maintain adequate fluid intake. ***Low oxalate diet. No follow-ups on file.  No orders of the defined types were placed in this encounter.   It has been explained that the patient is to follow regularly with their PCP in addition to all other providers involved in their care and to follow instructions provided by these respective offices. Patient advised to contact urology clinic if any urologic-pertaining questions, concerns, new symptoms or problems arise in the interim period.  There are no Patient Instructions on file for this visit.  Electronically signed by:  [name], MSN, FNP-C, CUNP 06/08/2023 12:20 PM

## 2023-06-09 ENCOUNTER — Ambulatory Visit: Payer: Medicare Other | Admitting: Urology

## 2023-06-09 DIAGNOSIS — N200 Calculus of kidney: Secondary | ICD-10-CM

## 2023-06-09 NOTE — ED Notes (Signed)
Patient complains of pain in abdomen from previously dx kidney stones. Patient requests to take her home meds (pain and other meds) at this time. Dr. Bernette Mayers notified and states it will be fine for her to take her nightly home meds. Patient notified

## 2023-06-09 NOTE — ED Notes (Addendum)
Patient hard to wake up for discharge, parents requested Dr. Bernette Mayers to look at patient before being discharged. Dr. Bernette Mayers at bedside. Dr. Bernette Mayers okay with patient being discharged.    This nurse explained to family if they wanted patient to stay a little longer we could arrange that. Patient family states they will take her home. This nurse encouraged family to bring patient back if they have any concerns.

## 2023-06-12 ENCOUNTER — Other Ambulatory Visit: Payer: Self-pay | Admitting: Nurse Practitioner

## 2023-06-15 ENCOUNTER — Telehealth: Payer: Self-pay

## 2023-06-15 NOTE — Telephone Encounter (Signed)
Transition Care Management Follow-up Telephone Call Date of discharge and from where: 06/09/2023 [facility] How have you been since you were released from the hospital? Patient is beginning to feel better. Any questions or concerns? No  Items Reviewed: Did the pt receive and understand the discharge instructions provided? Yes  Medications obtained and verified?  No medication prescribed. Other? No  Any new allergies since your discharge? No  Dietary orders reviewed? Yes Do you have support at home? Yes   Follow up appointments reviewed:  PCP Hospital f/u appt confirmed? Yes  Scheduled to see Mary-Margaret Daphine Deutscher, FNP on 06/12/2023 @ [facility]. Specialist Hospital f/u appt confirmed? No  Scheduled to see  on  @ . Are transportation arrangements needed? No  If their condition worsens, is the pt aware to call PCP or go to the Emergency Dept.? Yes Was the patient provided with contact information for the PCP's office or ED? Yes Was to pt encouraged to call back with questions or concerns? Yes  Quinnten Calvin [facility]  [facility] Population Health Community Resource Care Guide   ??[email or web address]  ?? [phone]   Website: [email or web address]  [email or web address]

## 2023-06-15 NOTE — Progress Notes (Signed)
Please let pt know RUS showed bilateral non-obstructing kidney stones. No hydronephrosis. Thanks.

## 2023-06-16 ENCOUNTER — Ambulatory Visit: Payer: Medicare Other | Admitting: Allergy and Immunology

## 2023-06-16 ENCOUNTER — Other Ambulatory Visit: Payer: Self-pay

## 2023-06-16 VITALS — BP 106/84 | HR 75 | Temp 98.7°F | Resp 16 | Ht 68.0 in | Wt 231.2 lb

## 2023-06-16 DIAGNOSIS — T7840XD Allergy, unspecified, subsequent encounter: Secondary | ICD-10-CM

## 2023-06-16 DIAGNOSIS — J4540 Moderate persistent asthma, uncomplicated: Secondary | ICD-10-CM

## 2023-06-16 NOTE — Progress Notes (Unsigned)
[name] - [place] - [name] - [name]   Follow-up Note  Referring Provider: [name], * Primary Provider: [name], FNP Date of Office Visit: 06/16/2023  Subjective:   Autumn Davis (DOB: July 12, 1986) is a 37 y.o. female who [facility] in re-evaluation of the following:  HPI: Autumn Davis to this clinic in evaluation of an allergic reaction.  I see her in this clinic for asthma, VCD, history of laryngeal spasm, and a history of chronic systemic steroid use for connective tissue disease.  Approximately 2 weeks ago she developed a kidney stone and went to the emergency room and was treated with multiple medications and then she subsequently developed eye swelling, tongue puffiness, puffy face, slurred speech, sedation, and she was treated with epinephrine, systemic steroids, and an H1 and H2 receptor blocker and over the course of the past several days after receiving that treatment she is actually a lot better.  She does not really have slurred speech and she does not have sedation and while the puffiness has resolved and she has no problems with her oral cavity.  Allergies as of 06/16/2023       Reactions   Ciprofloxacin Other (See Comments)   Muscle weakness and numbness Other reaction(s): Not available Muscle weakness and numbness Other reaction(s): Not available   Compazine Other (See Comments)   hallucinations   Prochlorperazine Other (See Comments), Rash   Pt states it makes her feel like things are crawling on her Other reaction(s): Not available        Medication List    acetaZOLAMIDE 250 MG tablet Commonly known as: DIAMOX TAKE 1 TABLET EVERY MORNING, TAKE 1 TABLET AT 2PM, AND TAKE 2 TABLETSAT BEDTIME   albuterol 108 (90 Base) MCG/ACT inhaler Commonly known as: Ventolin HFA Inhale 2 puffs into the lungs every 4 (four) hours as needed for wheezing or shortness of breath.    amitriptyline 75 MG tablet Commonly known as: ELAVIL Take 1 tablet (75 mg total) by mouth at bedtime.   atorvastatin 40 MG tablet Commonly known as: LIPITOR TAKE ONE (1) TABLET BY MOUTH EVERY DAY   baclofen 20 MG tablet Commonly known as: LIORESAL TAKE ONE TABLET FOUR TIMES DAILY   CALCIUM 1200+D3 PO Take 1 tablet by mouth daily.   cetirizine 10 MG tablet Commonly known as: ZYRTEC Take 1 tablet (10 mg total) by mouth 2 (two) times daily as needed for allergies (Can take an extra dose during flare ups.).   cromolyn 4 % ophthalmic solution Commonly known as: OPTICROM SMARTSIG:In Eye(s)   cyanocobalamin 1000 MCG/ML injection Commonly known as: VITAMIN B12 INJECT IM EVERY 30 DAYS   cycloSPORINE 0.05 % ophthalmic emulsion Commonly known as: RESTASIS Place 1 drop into both eyes 2 (two) times daily.   diclofenac 75 MG EC tablet Commonly known as: VOLTAREN TAKE ONE TABLET BY MOUTH TWICE DAILY   diclofenac Sodium 1 % Gel Commonly known as: Voltaren Apply 4 g topically 4 (four) times daily.   docusate sodium 100 MG capsule Commonly known as: COLACE Take 100 mg by mouth 2 (two) times daily. PRN   EPINEPHrine 0.3 mg/0.3 mL Soaj injection Commonly known as: EPI-PEN Inject 0.3 mg into the muscle as needed for anaphylaxis.   famotidine 40 MG tablet Commonly known as: PEPCID Take 1 tablet (40 mg total) by mouth at bedtime.   furosemide 20 MG tablet Commonly known as: LASIX Take 1 tablet (20 mg  total) by mouth daily.   gabapentin 600 MG tablet Commonly known as: NEURONTIN TAKE 1 TABLET BY MOUTH 3 TIMES DAILY & TAKE 2 TABLETS AT BEDTIME   ipratropium-albuterol 0.5-2.5 (3) MG/3ML Soln Commonly known as: DUONEB Take 3 mLs by nebulization every 4 (four) hours as needed.   L-Methylfolate 15 MG Tabs Take 1 tablet (15 mg total) by mouth daily.   MAGNESIUM GLYCINATE PO Take 2 tablets by mouth daily.   metoprolol succinate 25 MG 24 hr tablet Commonly known as:  TOPROL-XL Take 1 tablet (25 mg total) by mouth daily.   mirabegron ER 25 MG Tb24 tablet Commonly known as: MYRBETRIQ Take 1 tablet (25 mg total) by mouth daily.   mometasone-formoterol 200-5 MCG/ACT Aero Commonly known as: DULERA Inhale 2 puffs into the lungs in the morning and at bedtime.   montelukast 10 MG tablet Commonly known as: SINGULAIR Take 1 tablet (10 mg total) by mouth at bedtime.   mupirocin ointment 2 % Commonly known as: BACTROBAN APPLY A SMALL AMOUNT TO THE AFFECTED AREA BY TOPICAL ROUTE 3 TIMES PER DAY   norethindrone 0.35 MG tablet Commonly known as: MICRONOR Take 1 tablet by mouth daily.   nystatin 100000 UNIT/ML suspension Commonly known as: MYCOSTATIN Use as directed 5 mLs (500,000 Units total) in the mouth or throat 4 (four) times daily.   nystatin cream Commonly known as: MYCOSTATIN Apply 1 application topically 2 (two) times daily.   nystatin-triamcinolone ointment Commonly known as: MYCOLOG Apply 1 application topically 2 (two) times daily.   Omega 3 1000 MG Caps Take 1 capsule by mouth daily.   omeprazole 40 MG capsule Commonly known as: PRILOSEC Take 1 capsule (40 mg total) by mouth 2 (two) times daily.   oxyCODONE-acetaminophen 7.5-325 MG tablet Commonly known as: Percocet Take 1 tablet by mouth 5 (five) times daily as needed for moderate pain. No More Than 5 a day.   phenazopyridine 100 MG tablet Commonly known as: Pyridium Take 1 tablet (100 mg total) by mouth 3 (three) times daily as needed for pain.   predniSONE 5 MG tablet Commonly known as: DELTASONE Take 1 tablet (5 mg total) by mouth every morning.   PROBIOTIC-10 PO Take by mouth.   promethazine 12.5 MG tablet Commonly known as: PHENERGAN TAKE 1 TABLET BY MOUTH EVERY 8 HOURS AS NEEDED FOR NAUSEA & VOMITING   Qnasl 80 MCG/ACT Aers Generic drug: Beclomethasone Dipropionate   tamsulosin 0.4 MG Caps capsule Commonly known as: FLOMAX Take 1 capsule (0.4 mg total) by  mouth daily after breakfast.   VITAMIN C ADULT GUMMIES PO Take 2 each by mouth daily.   Vitamin D (Ergocalciferol) 1.25 MG (50000 UNIT) Caps capsule Commonly known as: DRISDOL TAKE 1 CAPSULE BY MOUTH EVERY 7 DAYS   vitamin E 180 MG (400 UNITS) capsule Take 1,000 Units by mouth daily.   ZINC PO Take 1 tablet by mouth daily.    Past Medical History:  Diagnosis Date   Allergy    Arthritis    HANDS,HIPS,KNEES   Asthma    Bronchitis, chronic/intermittent 01/22/2012   Depression 01/22/2012   GERD (gastroesophageal reflux disease)    Hyperlipidemia    IBS (irritable bowel syndrome) 01/22/2012   Lupus (HCC)    Neuromuscular disorder (HCC)    Osteoporosis    Seizures (HCC)    SOB (shortness of breath)    Thyroid disease     Past Surgical History:  Procedure Laterality Date   76 HOUR PH STUDY N/A 10/29/2015  Procedure: 24 HOUR PH STUDY;  Surgeon: Iva Boop, MD;  Location: [facility];  Service: Endoscopy;  Laterality: N/A;   COLONOSCOPY     ESOPHAGEAL MANOMETRY N/A 10/29/2015   Procedure: ESOPHAGEAL MANOMETRY (EM);  Surgeon: Iva Boop, MD;  Location: [facility];  Service: Endoscopy;  Laterality: N/A;   KNEE SURGERY  2002/2003   bil   RECTAL SURGERY  correction of prolapse   2010    Review of systems negative except as noted in HPI / PMHx or noted below:  Review of Systems  Constitutional: Negative.   HENT: Negative.    Eyes: Negative.   Respiratory: Negative.    Cardiovascular: Negative.   Gastrointestinal: Negative.   Genitourinary: Negative.   Musculoskeletal: Negative.   Skin: Negative.   Neurological: Negative.   Endo/Heme/Allergies: Negative.   Psychiatric/Behavioral: Negative.       Objective:   Vitals:   06/16/23 0933 06/16/23 0952  BP: (!) 140/100 106/84  Pulse: 75   Resp: 16   Temp: 98.7 F (37.1 C)   SpO2: 100%    Height: 5\' 8"  (172.7 cm)  Weight: 231 lb 3.2 oz (104.9 kg)   Physical Exam Constitutional:      Appearance:  She is not diaphoretic.  HENT:     Head: Normocephalic.     Right Ear: Tympanic membrane, ear canal and external ear normal.     Left Ear: Tympanic membrane, ear canal and external ear normal.     Nose: Nose normal. No mucosal edema or rhinorrhea.     Mouth/Throat:     Pharynx: Uvula midline. No oropharyngeal exudate.  Eyes:     Conjunctiva/sclera: Conjunctivae normal.  Neck:     Thyroid: No thyromegaly.     Trachea: Trachea normal. No tracheal tenderness or tracheal deviation.  Cardiovascular:     Rate and Rhythm: Normal rate and regular rhythm.     Heart sounds: Normal heart sounds, S1 normal and S2 normal. No murmur heard. Pulmonary:     Effort: No respiratory distress.     Breath sounds: Normal breath sounds. No stridor. No wheezing or rales.  Lymphadenopathy:     Head:     Right side of head: No tonsillar adenopathy.     Left side of head: No tonsillar adenopathy.     Cervical: No cervical adenopathy.  Skin:    Findings: No erythema or rash.     Nails: There is no clubbing.  Neurological:     Mental Status: She is alert.     Diagnostics:    Spirometry was performed and demonstrated an FEV1 of *** at *** % of predicted.  The patient had an Asthma Control Test with the following results:  .    Assessment and Plan:   No diagnosis found.  There are no Patient Instructions on file for this visit.  Laurette Schimke, MD Allergy / Immunology  [facility]

## 2023-06-17 ENCOUNTER — Encounter: Payer: Self-pay | Admitting: Allergy and Immunology

## 2023-06-26 ENCOUNTER — Encounter: Payer: Medicare Other | Admitting: Registered Nurse

## 2023-07-07 ENCOUNTER — Ambulatory Visit: Payer: Medicare Other | Admitting: Registered Nurse

## 2023-07-13 NOTE — Progress Notes (Unsigned)
Name: Autumn Davis DOB: 1985-12-01 MRN: [id number]  History of Present Illness: [name] is a 37 y.o. female who presents today for follow up visit at [facility]. - GU History: 1. Kidney stones. 2. Urinary frequency.  At last visit with Dr. Pete Glatter on 09/15/2022: The plan was:  - Continue stone prevention  - Continue Myrbetriq 25 mg daily.  Samples and rx given. - Continue tamsulosin - Return to office in 3 months  Since last visit: > 06/06/2023: Seen in ER for left LLQ pain. - CMP with normal renal function (GFR 79; creatinine 0.94). - CBC with leukocytosis (WBC 15.0). - UA showed 25 RBC/hpf. - CT showed: 1. Two small calculi in the proximal left ureter near the ureteropelvic junction measuring 0.4 and 0.3 cm, with mild associated hydronephrosis.  2. Additional nonobstructive bilateral renal calculi.  - She was discharged with prescriptions for Alfuzosin and Zofran. She routinely receives Percocet 7.5-325 mg as prescribed elsewhere.  > 06/08/2023: KUB showed bilateral non-obstructing kidney stones ("single 8 mm right renal calculus, with 2 left renal calculi seen measuring up to 5 mm. No ureteral or bladder calculi").   Today: She {Actions; denies-reports:120008} acute flank pain / abdominal pain. She {Actions; denies-reports:120008} fevers.  She {Actions; denies-reports:120008} nausea/ vomiting.  She urinates *** times per day. She {Actions; denies-reports:120008} urgency. She {Actions; denies-reports:120008} dysuria. She {Actions; denies-reports:120008} gross hematuria. She {Actions; denies-reports:120008} the need to strain to void. She {Actions; denies-reports:120008} sensations of incomplete emptying.  Fall Screening: Do you usually have a device to assist in your mobility? {yes/no:20286}  ***cane / ***walker / ***wheelchair  Medications: Current Outpatient Medications  Medication Sig Dispense Refill   acetaZOLAMIDE (DIAMOX)  250 MG tablet TAKE 1 TABLET EVERY MORNING, TAKE 1 TABLET AT 2PM, AND TAKE 2 TABLETSAT BEDTIME 360 tablet 1   albuterol (VENTOLIN HFA) 108 (90 Base) MCG/ACT inhaler Inhale 2 puffs into the lungs every 4 (four) hours as needed for wheezing or shortness of breath. 18 g 1   amitriptyline (ELAVIL) 75 MG tablet Take 1 tablet (75 mg total) by mouth at bedtime. 90 tablet 1   Ascorbic Acid (VITAMIN C ADULT GUMMIES PO) Take 2 each by mouth daily.     atorvastatin (LIPITOR) 40 MG tablet TAKE ONE (1) TABLET BY MOUTH EVERY DAY 90 tablet 1   baclofen (LIORESAL) 20 MG tablet TAKE ONE TABLET FOUR TIMES DAILY 360 each 1   Beclomethasone Dipropionate (QNASL) 80 MCG/ACT AERS      Calcium-Magnesium-Vitamin D (CALCIUM 1200+D3 PO) Take 1 tablet by mouth daily.     cetirizine (ZYRTEC) 10 MG tablet Take 1 tablet (10 mg total) by mouth 2 (two) times daily as needed for allergies (Can take an extra dose during flare ups.). 60 tablet 5   cromolyn (OPTICROM) 4 % ophthalmic solution SMARTSIG:In Eye(s)     cyanocobalamin (VITAMIN B12) 1000 MCG/ML injection INJECT IM EVERY 30 DAYS 3 mL 0   cycloSPORINE (RESTASIS) 0.05 % ophthalmic emulsion Place 1 drop into both eyes 2 (two) times daily.     diclofenac (VOLTAREN) 75 MG EC tablet TAKE ONE TABLET BY MOUTH TWICE DAILY 180 tablet 0   diclofenac Sodium (VOLTAREN) 1 % GEL Apply 4 g topically 4 (four) times daily. 350 g 1   docusate sodium (COLACE) 100 MG capsule Take 100 mg by mouth 2 (two) times daily. PRN     EPINEPHrine 0.3 mg/0.3 mL IJ SOAJ injection Inject 0.3 mg into the muscle as needed  for anaphylaxis. 2 each 2   famotidine (PEPCID) 40 MG tablet Take 1 tablet (40 mg total) by mouth at bedtime. 30 tablet 5   furosemide (LASIX) 20 MG tablet Take 1 tablet (20 mg total) by mouth daily. 90 tablet 1   gabapentin (NEURONTIN) 600 MG tablet TAKE 1 TABLET BY MOUTH 3 TIMES DAILY & TAKE 2 TABLETS AT BEDTIME 450 tablet 1   ipratropium-albuterol (DUONEB) 0.5-2.5 (3) MG/3ML SOLN Take 3  mLs by nebulization every 4 (four) hours as needed. 360 mL 4   L-Methylfolate 15 MG TABS Take 1 tablet (15 mg total) by mouth daily. 90 tablet 1   MAGNESIUM GLYCINATE PO Take 2 tablets by mouth daily.     metoprolol succinate (TOPROL-XL) 25 MG 24 hr tablet Take 1 tablet (25 mg total) by mouth daily. 90 tablet 1   mirabegron ER (MYRBETRIQ) 25 MG TB24 tablet Take 1 tablet (25 mg total) by mouth daily. 30 tablet 11   mometasone-formoterol (DULERA) 200-5 MCG/ACT AERO Inhale 2 puffs into the lungs in the morning and at bedtime. 13 g 5   montelukast (SINGULAIR) 10 MG tablet Take 1 tablet (10 mg total) by mouth at bedtime. 90 tablet 1   Multiple Vitamins-Minerals (ZINC PO) Take 1 tablet by mouth daily.     mupirocin ointment (BACTROBAN) 2 % APPLY A SMALL AMOUNT TO THE AFFECTED AREA BY TOPICAL ROUTE 3 TIMES PER DAY     norethindrone (MICRONOR) 0.35 MG tablet Take 1 tablet by mouth daily.     nystatin (MYCOSTATIN) 100000 UNIT/ML suspension Use as directed 5 mLs (500,000 Units total) in the mouth or throat 4 (four) times daily. 60 mL 2   nystatin cream (MYCOSTATIN) Apply 1 application topically 2 (two) times daily. 30 g 2   nystatin-triamcinolone ointment (MYCOLOG) Apply 1 application topically 2 (two) times daily. 30 g 0   Omega 3 1000 MG CAPS Take 1 capsule by mouth daily.     omeprazole (PRILOSEC) 40 MG capsule Take 1 capsule (40 mg total) by mouth 2 (two) times daily. 60 capsule 5   oxyCODONE-acetaminophen (PERCOCET) 7.5-325 MG tablet Take 1 tablet by mouth 5 (five) times daily as needed for moderate pain. No More Than 5 a day. 140 tablet 0   phenazopyridine (PYRIDIUM) 100 MG tablet Take 1 tablet (100 mg total) by mouth 3 (three) times daily as needed for pain. 10 tablet 0   predniSONE (DELTASONE) 5 MG tablet Take 1 tablet (5 mg total) by mouth every morning. 90 tablet 1   Probiotic Product (PROBIOTIC-10 PO) Take by mouth.     promethazine (PHENERGAN) 12.5 MG tablet TAKE 1 TABLET BY MOUTH EVERY 8 HOURS  AS NEEDED FOR NAUSEA & VOMITING 30 tablet 0   tamsulosin (FLOMAX) 0.4 MG CAPS capsule Take 1 capsule (0.4 mg total) by mouth daily after breakfast. 90 capsule 1   Vitamin D, Ergocalciferol, (DRISDOL) 1.25 MG (50000 UNIT) CAPS capsule TAKE 1 CAPSULE BY MOUTH EVERY 7 DAYS 12 capsule 1   vitamin E 180 MG (400 UNITS) capsule Take 1,000 Units by mouth daily.      No current facility-administered medications for this visit.    Allergies: Allergies  Allergen Reactions   Ciprofloxacin Other (See Comments)    Muscle weakness and numbness  Other reaction(s): Not available  Muscle weakness and numbness Other reaction(s): Not available   Compazine Other (See Comments)    hallucinations   Prochlorperazine Other (See Comments) and Rash    Pt states it makes  her feel like things are crawling on her Other reaction(s): Not available    Past Medical History:  Diagnosis Date   Allergy    Arthritis    HANDS,HIPS,KNEES   Asthma    Bronchitis, chronic/intermittent 01/22/2012   Depression 01/22/2012   GERD (gastroesophageal reflux disease)    Hyperlipidemia    IBS (irritable bowel syndrome) 01/22/2012   Lupus (HCC)    Neuromuscular disorder (HCC)    Osteoporosis    Seizures (HCC)    SOB (shortness of breath)    Thyroid disease    Past Surgical History:  Procedure Laterality Date   41 HOUR PH STUDY N/A 10/29/2015   Procedure: 24 HOUR PH STUDY;  Surgeon: Iva Boop, MD;  Location: [facility];  Service: Endoscopy;  Laterality: N/A;   COLONOSCOPY     ESOPHAGEAL MANOMETRY N/A 10/29/2015   Procedure: ESOPHAGEAL MANOMETRY (EM);  Surgeon: Iva Boop, MD;  Location: [facility];  Service: Endoscopy;  Laterality: N/A;   KNEE SURGERY  2002/2003   bil   RECTAL SURGERY  correction of prolapse   2010   Family History  Problem Relation Age of Onset   Thyroid disease Mother    Colon polyps Father    Lung cancer Maternal Grandmother    Cancer Paternal Grandmother         colon/pancreatiec/lymphoma   Irritable bowel syndrome Brother    AAA (abdominal aortic aneurysm) Neg Hx    Social History   Socioeconomic History   Marital status: Legally Separated    Spouse name: Not on file   Number of children: 1   Years of education: Not on file   Highest education level: Not on file  Occupational History   Occupation: disabled    Employer: [employer]  Tobacco Use   Smoking status: Never   Smokeless tobacco: Never  Vaping Use   Vaping status: Never Used  Substance and Sexual Activity   Alcohol use: No    Alcohol/week: 0.0 standard drinks of alcohol   Drug use: No   Sexual activity: Not on file  Other Topics Concern   Not on file  Social History Narrative   She lives alone with her 35 year old son - right now - they are living with her parents right now   Social Determinants of Health   Financial Resource Strain: Low Risk  (05/05/2023)   Overall Financial Resource Strain (CARDIA)    Difficulty of Paying Living Expenses: Not hard at all  Food Insecurity: No Food Insecurity (05/05/2023)   Hunger Vital Sign    Worried About Running Out of Food in the Last Year: Never true    Ran Out of Food in the Last Year: Never true  Transportation Needs: No Transportation Needs (05/05/2023)   PRAPARE - Administrator, Civil Service (Medical): No    Lack of Transportation (Non-Medical): No  Physical Activity: Insufficiently Active (05/05/2023)   Exercise Vital Sign    Days of Exercise per Week: 2 days    Minutes of Exercise per Session: 30 min  Stress: No Stress Concern Present (05/05/2023)   [employer] of Occupational Health - Occupational Stress Questionnaire    Feeling of Stress : Not at all  Social Connections: Moderately Isolated (05/05/2023)   Social Connection and Isolation Panel [NHANES]    Frequency of Communication with Friends and Family: More than three times a week    Frequency of Social Gatherings with Friends and  Family: More than three  times a week    Attends Religious Services: More than 4 times per year    Active Member of Clubs or Organizations: No    Attends Banker Meetings: Never    Marital Status: Separated  Intimate Partner Violence: Not At Risk (05/05/2023)   Humiliation, Afraid, Rape, and Kick questionnaire    Fear of Current or Ex-Partner: No    Emotionally Abused: No    Physically Abused: No    Sexually Abused: No    SUBJECTIVE  Review of Systems Constitutional: Patient ***denies any unintentional weight loss or change in strength lntegumentary: Patient ***denies any rashes or pruritus Eyes: Patient denies ***dry eyes ENT: Patient ***denies dry mouth Cardiovascular: Patient ***denies chest pain or syncope Respiratory: Patient ***denies shortness of breath Gastrointestinal: Patient ***denies nausea, vomiting, constipation, or diarrhea Musculoskeletal: Patient ***denies muscle cramps or weakness Neurologic: Patient ***denies convulsions or seizures Psychiatric: Patient ***denies memory problems Allergic/Immunologic: Patient ***denies recent allergic reaction(s) Hematologic/Lymphatic: Patient denies bleeding tendencies Endocrine: Patient ***denies heat/cold intolerance  GU: As per HPI.  OBJECTIVE There were no vitals filed for this visit. There is no height or weight on file to calculate BMI.  Physical Examination Constitutional: ***No obvious distress; patient is ***non-toxic appearing  Cardiovascular: ***No visible lower extremity edema.  Respiratory: The patient does ***not have audible wheezing/stridor; respirations do ***not appear labored  Gastrointestinal: Abdomen ***non-distended Musculoskeletal: ***Normal ROM of UEs  Skin: ***No obvious rashes/open sores  Neurologic: CN 2-12 grossly ***intact Psychiatric: Answered questions ***appropriately with ***normal affect  Hematologic/Lymphatic/Immunologic: ***No obvious bruises or sites of spontaneous  bleeding  UA: ***negative / *** WBC/hpf, *** RBC/hpf, bacteria (***) PVR: *** ml  ASSESSMENT No diagnosis found.  ***We reviewed recent imaging results; ***awaiting radiology results, appears to have ***no acute findings.  ***For stone prevention: Advised adequate hydration and we discussed option to consider low oxalate diet given that calcium oxalate is the most common type of stone. Handout provided about stone prevention diet.  ***For recurrent stone formers: We discussed option to proceed with 24 hour urinalysis ([facility]) for metabolic evaluation, which may help with targeted recommendations for dietary I medication therapies for stone prevention. Patient elected to ***proceed/ ***hold off.  Will plan to follow up in ***6 months / ***1 year with ***KUB ***RUS for stone surveillance or sooner if needed.  Pt verbalized understanding and agreement. All questions were answered.  PLAN Advised the following: ***Maintain adequate fluid intake. ***Low oxalate diet. No follow-ups on file.  No orders of the defined types were placed in this encounter.   It has been explained that the patient is to follow regularly with their PCP in addition to all other providers involved in their care and to follow instructions provided by these respective offices. Patient advised to contact urology clinic if any urologic-pertaining questions, concerns, new symptoms or problems arise in the interim period.  There are no Patient Instructions on file for this visit.  Electronically signed by:  [name], MSN, FNP-C, CUNP 07/13/2023 8:33 AM

## 2023-07-14 ENCOUNTER — Other Ambulatory Visit: Payer: Self-pay | Admitting: Nurse Practitioner

## 2023-07-15 ENCOUNTER — Ambulatory Visit: Payer: Medicare Other | Admitting: Urology

## 2023-07-15 ENCOUNTER — Encounter: Payer: Self-pay | Admitting: Urology

## 2023-07-15 VITALS — BP 135/89 | HR 74 | Temp 98.4°F

## 2023-07-15 DIAGNOSIS — R350 Frequency of micturition: Secondary | ICD-10-CM | POA: Diagnosis not present

## 2023-07-15 DIAGNOSIS — K581 Irritable bowel syndrome with constipation: Secondary | ICD-10-CM

## 2023-07-15 DIAGNOSIS — N200 Calculus of kidney: Secondary | ICD-10-CM

## 2023-07-15 DIAGNOSIS — R351 Nocturia: Secondary | ICD-10-CM | POA: Diagnosis not present

## 2023-07-15 DIAGNOSIS — R3915 Urgency of urination: Secondary | ICD-10-CM

## 2023-07-15 DIAGNOSIS — H04123 Dry eye syndrome of bilateral lacrimal glands: Secondary | ICD-10-CM

## 2023-07-15 LAB — URINALYSIS, ROUTINE W REFLEX MICROSCOPIC
Bilirubin, UA: NEGATIVE
Glucose, UA: NEGATIVE
Ketones, UA: NEGATIVE
Leukocytes,UA: NEGATIVE
Nitrite, UA: NEGATIVE
Protein,UA: NEGATIVE
Specific Gravity, UA: 1.025 (ref 1.005–1.030)
Urobilinogen, Ur: 1 mg/dL (ref 0.2–1.0)
pH, UA: 6 (ref 5.0–7.5)

## 2023-07-15 LAB — MICROSCOPIC EXAMINATION: RBC, Urine: >30 /hpf — AB (ref 0–2)

## 2023-07-15 LAB — BLADDER SCAN AMB NON-IMAGING: Scan Result: 3

## 2023-07-15 MED ORDER — MIRABEGRON ER 25 MG PO TB24: 25.0000 mg | ORAL_TABLET | Freq: Every day | ORAL | 11 refills | Status: DC

## 2023-07-15 NOTE — Patient Instructions (Signed)

## 2023-07-17 ENCOUNTER — Encounter: Payer: Medicare Other | Attending: Registered Nurse | Admitting: Registered Nurse

## 2023-07-17 ENCOUNTER — Encounter: Payer: Self-pay | Admitting: Registered Nurse

## 2023-07-17 VITALS — BP 126/89 | HR 81 | Ht 68.0 in

## 2023-07-17 DIAGNOSIS — M546 Pain in thoracic spine: Secondary | ICD-10-CM | POA: Diagnosis not present

## 2023-07-17 DIAGNOSIS — Z79891 Long term (current) use of opiate analgesic: Secondary | ICD-10-CM | POA: Diagnosis not present

## 2023-07-17 DIAGNOSIS — Z5181 Encounter for therapeutic drug level monitoring: Secondary | ICD-10-CM | POA: Diagnosis not present

## 2023-07-17 DIAGNOSIS — G609 Hereditary and idiopathic neuropathy, unspecified: Secondary | ICD-10-CM | POA: Diagnosis present

## 2023-07-17 DIAGNOSIS — M2550 Pain in unspecified joint: Secondary | ICD-10-CM | POA: Diagnosis not present

## 2023-07-17 DIAGNOSIS — M797 Fibromyalgia: Secondary | ICD-10-CM | POA: Insufficient documentation

## 2023-07-17 DIAGNOSIS — G8929 Other chronic pain: Secondary | ICD-10-CM | POA: Diagnosis not present

## 2023-07-17 DIAGNOSIS — G894 Chronic pain syndrome: Secondary | ICD-10-CM | POA: Diagnosis not present

## 2023-07-17 DIAGNOSIS — M7061 Trochanteric bursitis, right hip: Secondary | ICD-10-CM | POA: Diagnosis not present

## 2023-07-17 DIAGNOSIS — M7062 Trochanteric bursitis, left hip: Secondary | ICD-10-CM | POA: Diagnosis not present

## 2023-07-17 MED ORDER — OXYCODONE-ACETAMINOPHEN 7.5-325 MG PO TABS: 1.0000 | ORAL_TABLET | Freq: Every day | ORAL | 0 refills | Status: DC | PRN

## 2023-07-17 MED ORDER — OXYCODONE-ACETAMINOPHEN 7.5-325 MG PO TABS
1.0000 | ORAL_TABLET | Freq: Every day | ORAL | 0 refills | Status: DC | PRN
Start: 2023-07-17 — End: 2023-07-17

## 2023-07-17 NOTE — Progress Notes (Signed)
Subjective:    Patient ID: Autumn Davis, female    DOB: [birth year], 37 y.o.   MRN: [id number]  HPI: Autumn Davis is a 37 y.o. female who returns for follow up appointment for chronic pain and medication refill. She states her pain is located in her mid- back pain, bilateral hips L>R and tingling and burning in her bilateral hands and bilateral lower extremity. She rates her pain 7. Her current exercise regime is walking and performing stretching exercises.  Ms. Leonides Cave- [name] was seen at [facility] emergency room for abdominal pain and kidney stones, note was reviewed.   Ms. Mamie Nick was seen a [facility] on 06/08/2023 for allergic reaction, discharge summary was reviewed.   Ms. Leonides Cave Morphine equivalent is 22.75 MME.   Last Oral Swab was Performed on 02/16/2023, it was consistent.    Pain Inventory Average Pain 6 Pain Right Now 7 My pain is constant, sharp, burning, dull, and tingling  In the last 24 hours, has pain interfered with the following? General activity 8 Relation with others 6 Enjoyment of life 8 What TIME of day is your pain at its worst? evening and night Sleep (in general) Poor  Pain is worse with: walking, bending, sitting, inactivity, standing, and some activites Pain improves with: rest, heat/ice, pacing activities, medication, and TENS Relief from Meds: 6  Family History  Problem Relation Age of Onset   Thyroid disease Mother    Colon polyps Father    Lung cancer Maternal Grandmother    Cancer Paternal Grandmother        colon/pancreatiec/lymphoma   Irritable bowel syndrome Brother    AAA (abdominal aortic aneurysm) Neg Hx    Social History   Socioeconomic History   Marital status: Legally Separated    Spouse name: Not on file   Number of children: 1   Years of education: Not on file   Highest education level: Not on file  Occupational History   Occupation: disabled    Employer: [employer]  Tobacco Use    Smoking status: Never   Smokeless tobacco: Never  Vaping Use   Vaping status: Never Used  Substance and Sexual Activity   Alcohol use: No    Alcohol/week: 0.0 standard drinks of alcohol   Drug use: No   Sexual activity: Not Currently  Other Topics Concern   Not on file  Social History Narrative   She lives alone with her 44 year old son - right now - they are living with her parents right now   Social Determinants of Health   Financial Resource Strain: Low Risk  (05/05/2023)   Overall Financial Resource Strain (CARDIA)    Difficulty of Paying Living Expenses: Not hard at all  Food Insecurity: No Food Insecurity (05/05/2023)   Hunger Vital Sign    Worried About Running Out of Food in the Last Year: Never true    Ran Out of Food in the Last Year: Never true  Transportation Needs: No Transportation Needs (05/05/2023)   PRAPARE - Administrator, Civil Service (Medical): No    Lack of Transportation (Non-Medical): No  Physical Activity: Insufficiently Active (05/05/2023)   Exercise Vital Sign    Days of Exercise per Week: 2 days    Minutes of Exercise per Session: 30 min  Stress: No Stress Concern Present (05/05/2023)   [employer] of Occupational Health - Occupational Stress Questionnaire    Feeling of Stress : Not at all  Social Connections: Moderately Isolated (05/05/2023)   Social Connection and Isolation Panel [NHANES]    Frequency of Communication with Friends and Family: More than three times a week    Frequency of Social Gatherings with Friends and Family: More than three times a week    Attends Religious Services: More than 4 times per year    Active Member of Clubs or Organizations: No    Attends Banker Meetings: Never    Marital Status: Separated   Past Surgical History:  Procedure Laterality Date   24 HOUR PH STUDY N/A 10/29/2015   Procedure: 24 HOUR PH STUDY;  Surgeon: Iva Boop, MD;  Location: [facility];  Service: Endoscopy;   Laterality: N/A;   COLONOSCOPY     ESOPHAGEAL MANOMETRY N/A 10/29/2015   Procedure: ESOPHAGEAL MANOMETRY (EM);  Surgeon: Iva Boop, MD;  Location: [facility];  Service: Endoscopy;  Laterality: N/A;   KNEE SURGERY  2002/2003   bil   RECTAL SURGERY  correction of prolapse   2010   Past Surgical History:  Procedure Laterality Date   86 HOUR PH STUDY N/A 10/29/2015   Procedure: 24 HOUR PH STUDY;  Surgeon: Iva Boop, MD;  Location: [facility];  Service: Endoscopy;  Laterality: N/A;   COLONOSCOPY     ESOPHAGEAL MANOMETRY N/A 10/29/2015   Procedure: ESOPHAGEAL MANOMETRY (EM);  Surgeon: Iva Boop, MD;  Location: [facility];  Service: Endoscopy;  Laterality: N/A;   KNEE SURGERY  2002/2003   bil   RECTAL SURGERY  correction of prolapse   2010   Past Medical History:  Diagnosis Date   Allergy    Arthritis    HANDS,HIPS,KNEES   Asthma    Bronchitis, chronic/intermittent 01/22/2012   Depression 01/22/2012   GERD (gastroesophageal reflux disease)    Hyperlipidemia    IBS (irritable bowel syndrome) 01/22/2012   Lupus (HCC)    Neuromuscular disorder (HCC)    Osteoporosis    Seizures (HCC)    SOB (shortness of breath)    Thyroid disease    BP 126/89   Pulse 81   Ht 5\' 8"  (1.727 m)   SpO2 100%   BMI 35.15 kg/m   Opioid Risk Score:   Fall Risk Score:  `1  Depression screen [facility] 2/9     06/02/2023    3:43 PM 05/05/2023    9:49 AM 12/08/2022   11:19 AM 11/26/2022    4:14 PM 07/28/2022   11:31 AM 05/22/2022   12:20 PM 05/01/2022   10:00 AM  Depression screen PHQ 2/9  Decreased Interest 0 0 0 1 0 1 1  Down, Depressed, Hopeless 0 0 0 0 0 0 1  PHQ - 2 Score 0 0 0 1 0 1 2  Altered sleeping 3   1  1 1   Tired, decreased energy 1   1  1 1   Change in appetite 1   1  0 1  Feeling bad or failure about yourself  0   0  0 0  Trouble concentrating 0   0  0 0  Moving slowly or fidgety/restless 0   0  0 0  Suicidal thoughts 0   0  0 0  PHQ-9 Score 5   4  3 5   Difficult  doing work/chores Somewhat difficult   Somewhat difficult  Not difficult at all Somewhat difficult      Review of Systems     Objective:   Physical Exam Vitals and nursing  note reviewed.  Constitutional:      Appearance: Normal appearance.  Cardiovascular:     Rate and Rhythm: Normal rate and regular rhythm.     Pulses: Normal pulses.     Heart sounds: Normal heart sounds.  Pulmonary:     Effort: Pulmonary effort is normal.     Breath sounds: Normal breath sounds.  Musculoskeletal:     Cervical back: Normal range of motion and neck supple.     Comments: Normal Muscle Bulk and Muscle Testing Reveals:  Upper Extremities: Full ROM and Muscle Strength 5/5  Thoracic Paraspinal Tenderness: T-7-T-9 Lumbar Paraspinal Tenderness: L-4-L-5 Lower Extremities: Full ROM and Muscle Strength 5/5 Arises from Table slowly Narrow Based  Gait     Skin:    General: Skin is warm and dry.  Neurological:     Mental Status: She is alert and oriented to person, place, and time.  Psychiatric:        Mood and Affect: Mood normal.        Behavior: Behavior normal.         Assessment & Plan:  1. Chronic seizure disorder: No seizure's. Continue current medication regimen with  Gabapentin. Neurology Following. 07/17/2023. 2. Chronic muscle spasms, weakness with associated pain disorder: Continue current medication regimen with Baclofen. Continue with Exercise regime. 07/17/2023. 3. Chronic dysphagia: No complaints today. Continue to monitor. GI Following. 07/17/2023. 4. Anxiety with depression : No complaints today. Stable. Continue current medication regimen with  Elavil. 07/17/2023. 5. Fibromyalgia/ Rib Pain/Chronic Pain: Continue Diclofenac: Continue with exercise and heat Therapy. 07/17/2023. Refilled:  oxyCODONE 7.5/325mg  one tablet 5 times  daily as needed  #140. Second script sent for the following month. Continue with slow weaning.   We will continue the opioid monitoring program, this  consists of regular clinic visits, examinations, urine drug screen, pill counts as well as use of [place] Controlled Substance Reporting system. A 12 month History has been reviewed on the [place] Controlled Substance Reporting System 07/17/2023.  6. Peripheral Neuropathy: Continue current medication regimen with  Gabapentin: 07/17/2023.     F/U in 2 months

## 2023-08-06 ENCOUNTER — Other Ambulatory Visit: Payer: Self-pay | Admitting: Nurse Practitioner

## 2023-08-06 DIAGNOSIS — R5382 Chronic fatigue, unspecified: Secondary | ICD-10-CM

## 2023-08-30 ENCOUNTER — Other Ambulatory Visit: Payer: Self-pay | Admitting: Nurse Practitioner

## 2023-08-30 DIAGNOSIS — K581 Irritable bowel syndrome with constipation: Secondary | ICD-10-CM

## 2023-09-11 ENCOUNTER — Encounter: Payer: Medicare Other | Attending: Registered Nurse | Admitting: Registered Nurse

## 2023-09-11 ENCOUNTER — Encounter: Payer: Self-pay | Admitting: Registered Nurse

## 2023-09-11 VITALS — BP 123/85 | HR 87 | Ht 68.0 in | Wt 230.0 lb

## 2023-09-11 DIAGNOSIS — G609 Hereditary and idiopathic neuropathy, unspecified: Secondary | ICD-10-CM | POA: Diagnosis present

## 2023-09-11 DIAGNOSIS — Z5181 Encounter for therapeutic drug level monitoring: Secondary | ICD-10-CM | POA: Diagnosis not present

## 2023-09-11 DIAGNOSIS — Z79891 Long term (current) use of opiate analgesic: Secondary | ICD-10-CM | POA: Diagnosis not present

## 2023-09-11 DIAGNOSIS — M2550 Pain in unspecified joint: Secondary | ICD-10-CM | POA: Diagnosis not present

## 2023-09-11 DIAGNOSIS — M797 Fibromyalgia: Secondary | ICD-10-CM | POA: Insufficient documentation

## 2023-09-11 DIAGNOSIS — G894 Chronic pain syndrome: Secondary | ICD-10-CM | POA: Insufficient documentation

## 2023-09-11 DIAGNOSIS — M546 Pain in thoracic spine: Secondary | ICD-10-CM | POA: Diagnosis not present

## 2023-09-11 DIAGNOSIS — M7062 Trochanteric bursitis, left hip: Secondary | ICD-10-CM | POA: Diagnosis not present

## 2023-09-11 DIAGNOSIS — G8929 Other chronic pain: Secondary | ICD-10-CM | POA: Diagnosis not present

## 2023-09-11 DIAGNOSIS — M7061 Trochanteric bursitis, right hip: Secondary | ICD-10-CM | POA: Insufficient documentation

## 2023-09-11 MED ORDER — OXYCODONE-ACETAMINOPHEN 7.5-325 MG PO TABS: 1.0000 | ORAL_TABLET | Freq: Every day | ORAL | 0 refills | Status: DC | PRN

## 2023-09-11 NOTE — Progress Notes (Signed)
Subjective:    Patient ID: Autumn Davis, female    DOB: Mar 19, 1986, 37 y.o.   MRN: [id number]  HPI: Autumn Davis is a 37 y.o. female who returns for follow up appointment for chronic pain and medication refill. She states her pain is located in her mid- back and bilateral hips. She also reports bilateral hands and bilateral feet with tingling and burning. She rates her pain 5. Her current exercise regime is walking and performing stretching exercises.  Autumn Davis Morphine equivalent is 56.25 MME.   Oral Swab was Performed today.     Pain Inventory Average Pain 7 Pain Right Now 5 My pain is constant, sharp, burning, stabbing, tingling, and aching  In the last 24 hours, has pain interfered with the following? General activity 8 Relation with others 6 Enjoyment of life 6 What TIME of day is your pain at its worst? evening and night Sleep (in general) Poor  Pain is worse with: walking, bending, sitting, inactivity, standing, and some activites Pain improves with: rest, heat/ice, therapy/exercise, pacing activities, medication, and TENS Relief from Meds: 7  Family History  Problem Relation Age of Onset   Thyroid disease Mother    Colon polyps Father    Lung cancer Maternal Grandmother    Cancer Paternal Grandmother        colon/pancreatiec/lymphoma   Irritable bowel syndrome Brother    AAA (abdominal aortic aneurysm) Neg Hx    Social History   Socioeconomic History   Marital status: Legally Separated    Spouse name: Not on file   Number of children: 1   Years of education: Not on file   Highest education level: Not on file  Occupational History   Occupation: disabled    Employer: [employer]  Tobacco Use   Smoking status: Never   Smokeless tobacco: Never  Vaping Use   Vaping status: Never Used  Substance and Sexual Activity   Alcohol use: No    Alcohol/week: 0.0 standard drinks of alcohol   Drug use: No   Sexual activity: Not  Currently  Other Topics Concern   Not on file  Social History Narrative   She lives alone with her 39 year old son - right now - they are living with her parents right now   Social Determinants of Health   Financial Resource Strain: Low Risk  (05/05/2023)   Overall Financial Resource Strain (CARDIA)    Difficulty of Paying Living Expenses: Not hard at all  Food Insecurity: No Food Insecurity (05/05/2023)   Hunger Vital Sign    Worried About Running Out of Food in the Last Year: Never true    Ran Out of Food in the Last Year: Never true  Transportation Needs: No Transportation Needs (05/05/2023)   PRAPARE - Administrator, Civil Service (Medical): No    Lack of Transportation (Non-Medical): No  Physical Activity: Insufficiently Active (05/05/2023)   Exercise Vital Sign    Days of Exercise per Week: 2 days    Minutes of Exercise per Session: 30 min  Stress: No Stress Concern Present (05/05/2023)   [employer] of Occupational Health - Occupational Stress Questionnaire    Feeling of Stress : Not at all  Social Connections: Moderately Isolated (05/05/2023)   Social Connection and Isolation Panel [NHANES]    Frequency of Communication with Friends and Family: More than three times a week    Frequency of Social Gatherings with Friends and Family: More than  three times a week    Attends Religious Services: More than 4 times per year    Active Member of Clubs or Organizations: No    Attends Banker Meetings: Never    Marital Status: Separated   Past Surgical History:  Procedure Laterality Date   24 HOUR PH STUDY N/A 10/29/2015   Procedure: 24 HOUR PH STUDY;  Surgeon: Iva Boop, MD;  Location: [facility];  Service: Endoscopy;  Laterality: N/A;   COLONOSCOPY     ESOPHAGEAL MANOMETRY N/A 10/29/2015   Procedure: ESOPHAGEAL MANOMETRY (EM);  Surgeon: Iva Boop, MD;  Location: [facility];  Service: Endoscopy;  Laterality: N/A;   KNEE SURGERY  2002/2003    bil   RECTAL SURGERY  correction of prolapse   2010   Past Surgical History:  Procedure Laterality Date   20 HOUR PH STUDY N/A 10/29/2015   Procedure: 24 HOUR PH STUDY;  Surgeon: Iva Boop, MD;  Location: [facility];  Service: Endoscopy;  Laterality: N/A;   COLONOSCOPY     ESOPHAGEAL MANOMETRY N/A 10/29/2015   Procedure: ESOPHAGEAL MANOMETRY (EM);  Surgeon: Iva Boop, MD;  Location: [facility];  Service: Endoscopy;  Laterality: N/A;   KNEE SURGERY  2002/2003   bil   RECTAL SURGERY  correction of prolapse   2010   Past Medical History:  Diagnosis Date   Allergy    Arthritis    HANDS,HIPS,KNEES   Asthma    Bronchitis, chronic/intermittent 01/22/2012   Depression 01/22/2012   GERD (gastroesophageal reflux disease)    Hyperlipidemia    IBS (irritable bowel syndrome) 01/22/2012   Lupus (HCC)    Neuromuscular disorder (HCC)    Osteoporosis    Seizures (HCC)    SOB (shortness of breath)    Thyroid disease    There were no vitals taken for this visit.  Opioid Risk Score:   Fall Risk Score:  `1  Depression screen [facility] 2/9     06/02/2023    3:43 PM 05/05/2023    9:49 AM 12/08/2022   11:19 AM 11/26/2022    4:14 PM 07/28/2022   11:31 AM 05/22/2022   12:20 PM 05/01/2022   10:00 AM  Depression screen PHQ 2/9  Decreased Interest 0 0 0 1 0 1 1  Down, Depressed, Hopeless 0 0 0 0 0 0 1  PHQ - 2 Score 0 0 0 1 0 1 2  Altered sleeping 3   1  1 1   Tired, decreased energy 1   1  1 1   Change in appetite 1   1  0 1  Feeling bad or failure about yourself  0   0  0 0  Trouble concentrating 0   0  0 0  Moving slowly or fidgety/restless 0   0  0 0  Suicidal thoughts 0   0  0 0  PHQ-9 Score 5   4  3 5   Difficult doing work/chores Somewhat difficult   Somewhat difficult  Not difficult at all Somewhat difficult     Review of Systems  Musculoskeletal:  Positive for back pain.       Pain in both lower legs & both hands  All other systems reviewed and are negative.       Objective:   Physical Exam Vitals and nursing note reviewed.  Constitutional:      Appearance: Normal appearance.  Cardiovascular:     Rate and Rhythm: Normal rate and regular rhythm.  Pulses: Normal pulses.     Heart sounds: Normal heart sounds.  Pulmonary:     Effort: Pulmonary effort is normal.     Breath sounds: Normal breath sounds.  Musculoskeletal:     Cervical back: Normal range of motion and neck supple.     Comments: Normal Muscle Bulk and Muscle Testing Reveals:  Upper Extremities: Full ROM and Muscle Strength 5/5  Thoracic Paraspinal Tenderness: T-7-T-9   Lower Extremities: Full ROM and Muscle Strength 5/5 Arises from Chair with ease Narrow Based  Gait     Skin:    General: Skin is warm and dry.  Neurological:     Mental Status: She is alert and oriented to person, place, and time.  Psychiatric:        Mood and Affect: Mood normal.        Behavior: Behavior normal.         Assessment & Plan:  1. Chronic seizure disorder: No seizure's. Continue current medication regimen with  Gabapentin. Neurology Following. 09/11/2023. 2. Chronic muscle spasms, weakness with associated pain disorder: Continue current medication regimen with Baclofen. Continue with Exercise regime. 09/11/2023. 3. Chronic dysphagia: No complaints today. Continue to monitor. GI Following. 09/11/2023. 4. Anxiety with depression : No complaints today. Stable. Continue current medication regimen with  Elavil. 09/11/2023. 5. Fibromyalgia/ Rib Pain/Chronic Pain: Continue Diclofenac: Continue with exercise and heat Therapy. 09/11/2023. Refilled:  oxyCODONE 7.5/325mg  one tablet 5 times  daily as needed  #140. Second script sent for the following month. Continue with slow weaning.   We will continue the opioid monitoring program, this consists of regular clinic visits, examinations, urine drug screen, pill counts as well as use of [place] Controlled Substance Reporting system. A 12 month History  has been reviewed on the [place] Controlled Substance Reporting System 09/11/2023.  6. Peripheral Neuropathy: Continue current medication regimen with  Gabapentin: 09/11/2023.     F/U in 2 months

## 2023-09-16 LAB — DRUG TOX MONITOR 1 W/CONF, ORAL FLD
Amphetamines: NEGATIVE ng/mL (ref <10)
Barbiturates: NEGATIVE ng/mL (ref <10)
Benzodiazepines: NEGATIVE ng/mL (ref <0.50)
Buprenorphine: NEGATIVE ng/mL (ref <0.10)
Cocaine: NEGATIVE ng/mL (ref <5.0)
Codeine: NEGATIVE ng/mL (ref <2.5)
Dihydrocodeine: NEGATIVE ng/mL (ref <2.5)
Fentanyl: NEGATIVE ng/mL (ref <0.10)
Heroin Metabolite: NEGATIVE ng/mL (ref <1.0)
Hydrocodone: NEGATIVE ng/mL (ref <2.5)
Hydromorphone: NEGATIVE ng/mL (ref <2.5)
MARIJUANA: NEGATIVE ng/mL (ref <2.5)
MDMA: NEGATIVE ng/mL (ref <10)
Meprobamate: NEGATIVE ng/mL (ref <2.5)
Methadone: NEGATIVE ng/mL (ref <5.0)
Morphine: NEGATIVE ng/mL (ref <2.5)
Nicotine Metabolite: NEGATIVE ng/mL (ref <5.0)
Norhydrocodone: NEGATIVE ng/mL (ref <2.5)
Noroxycodone: 29.4 ng/mL — ABNORMAL HIGH (ref <2.5)
Opiates: POSITIVE ng/mL — AB (ref <2.5)
Oxycodone: 219.5 ng/mL — ABNORMAL HIGH (ref <2.5)
Oxymorphone: NEGATIVE ng/mL (ref <2.5)
Phencyclidine: NEGATIVE ng/mL (ref <10)
Tapentadol: NEGATIVE ng/mL (ref <5.0)
Tramadol: NEGATIVE ng/mL (ref <5.0)
Zolpidem: NEGATIVE ng/mL (ref <5.0)

## 2023-09-16 LAB — DRUG TOX ALC METAB W/CON, ORAL FLD: Alcohol Metabolite: NEGATIVE ng/mL (ref <25)

## 2023-09-22 ENCOUNTER — Telehealth: Payer: Self-pay | Admitting: Registered Nurse

## 2023-09-22 DIAGNOSIS — M797 Fibromyalgia: Secondary | ICD-10-CM

## 2023-09-22 MED ORDER — OXYCODONE-ACETAMINOPHEN 7.5-325 MG PO TABS: 1.0000 | ORAL_TABLET | Freq: Every day | ORAL | 0 refills | Status: DC | PRN

## 2023-09-22 NOTE — Telephone Encounter (Signed)
PMP was Reviewed Oxycodone e-scribed to pharmacy.  [name] is aware of the above via My-Chart

## 2023-09-30 ENCOUNTER — Other Ambulatory Visit: Payer: Self-pay | Admitting: Allergy and Immunology

## 2023-10-09 ENCOUNTER — Other Ambulatory Visit: Payer: Self-pay | Admitting: Nurse Practitioner

## 2023-10-23 ENCOUNTER — Ambulatory Visit (INDEPENDENT_AMBULATORY_CARE_PROVIDER_SITE_OTHER): Payer: Medicare Other

## 2023-10-23 DIAGNOSIS — Z23 Encounter for immunization: Secondary | ICD-10-CM | POA: Diagnosis not present

## 2023-11-03 ENCOUNTER — Ambulatory Visit (INDEPENDENT_AMBULATORY_CARE_PROVIDER_SITE_OTHER): Payer: Medicare Other | Admitting: Allergy and Immunology

## 2023-11-03 VITALS — BP 130/86 | HR 100 | Temp 97.9°F | Resp 18 | Ht 68.0 in | Wt 228.3 lb

## 2023-11-03 DIAGNOSIS — T7840XD Allergy, unspecified, subsequent encounter: Secondary | ICD-10-CM | POA: Diagnosis not present

## 2023-11-03 DIAGNOSIS — K219 Gastro-esophageal reflux disease without esophagitis: Secondary | ICD-10-CM

## 2023-11-03 DIAGNOSIS — T783XXD Angioneurotic edema, subsequent encounter: Secondary | ICD-10-CM

## 2023-11-03 DIAGNOSIS — J4540 Moderate persistent asthma, uncomplicated: Secondary | ICD-10-CM

## 2023-11-03 DIAGNOSIS — J3089 Other allergic rhinitis: Secondary | ICD-10-CM | POA: Diagnosis not present

## 2023-11-03 DIAGNOSIS — J383 Other diseases of vocal cords: Secondary | ICD-10-CM | POA: Diagnosis not present

## 2023-11-03 MED ORDER — ALBUTEROL SULFATE HFA 108 (90 BASE) MCG/ACT IN AERS: 2.0000 | INHALATION_SPRAY | RESPIRATORY_TRACT | 1 refills | Status: DC | PRN

## 2023-11-03 MED ORDER — MONTELUKAST SODIUM 10 MG PO TABS: 10.0000 mg | ORAL_TABLET | Freq: Every day | ORAL | 1 refills | Status: DC

## 2023-11-03 MED ORDER — MOMETASONE FURO-FORMOTEROL FUM 200-5 MCG/ACT IN AERO: INHALATION_SPRAY | RESPIRATORY_TRACT | 5 refills | Status: DC

## 2023-11-03 MED ORDER — CETIRIZINE HCL 10 MG PO TABS: 10.0000 mg | ORAL_TABLET | Freq: Two times a day (BID) | ORAL | 5 refills | Status: DC | PRN

## 2023-11-03 MED ORDER — FAMOTIDINE 40 MG PO TABS: 40.0000 mg | ORAL_TABLET | Freq: Every evening | ORAL | 5 refills | Status: DC

## 2023-11-03 MED ORDER — OMEPRAZOLE 40 MG PO CPDR: 40.0000 mg | DELAYED_RELEASE_CAPSULE | Freq: Two times a day (BID) | ORAL | 5 refills | Status: DC

## 2023-11-03 NOTE — Patient Instructions (Addendum)
  1.  Allergen avoidance measures  - dust mite, Alfuzosin  2.  Continue to treat and prevent inflammation:  A.  Dulera 200 - 2 inhalations 1-2 times per day w/ spacer  B.  Montelukast 10 mg -1 tablet 1 time per day C.  OTC Rhinocort  - 1-2 sprays each nostril 3-7 times per week  3.  Treat and prevent reflux/LPR:  A.  Minimize all forms of caffeine consumption B.  Omeprazole 40 mg -1 tablet twice a day C.  Famotidine 40 mg -1 tablet in evening D.  Replace throat clearing with swallowing maneuver  4.  If needed:  A. Albuterol HFA - 2 inhalations or nebulization every 4-6 hours B. Antihistamine - cetirizine 10 mg - 1-2 tabs 1-2 times per day  C. Epi-pen  5. Follow through with sleep study  6. Evaluation of swelling events:  A. Blood - C1 esterase inhibitor level and function, C4, tryptase B. Further evaluation???  7. Return to clinic in 4 weeks or earlier if problem

## 2023-11-03 NOTE — Progress Notes (Unsigned)
[place] [facility] [place] [facility] [place] [facility] [name] [facility] [name]   Follow-up Note   Referring Provider: [name], * Primary Provider: [name], FNP Date of Office Visit: 11/03/2023  Subjective:   Autumn Davis (DOB: 07/05/1986) is a 37 y.o. female who [facility] in re-evaluation of the following:  HPI: Shaya returns to this clinic in evaluation of asthma, vocal cord dysfunction, laryngeal spasm, possible drug reaction to Alfuzosin .  I last saw her in this clinic 16 June 2023.  Roslyn has a new development.  Over the course of the past month she has had about 5 episodes of uvula swelling without any other associated systemic or constitutional symptoms and without any obvious trigger.  She has used epinephrine on 2 occasions.  She is also being evaluated for sleep apnea and she is visiting with pulmonology to have a sleep study performed.  She has had very little problems with her asthma while using her Dulera and she occasionally uses some nasal steroids for her nose and that appears to be working quite well.  She has not required a systemic steroid or antibiotic for any type of airway issue.  Her requirement for albuterol is almost 0/week.  She believes that her reflux is under good control with omeprazole and famotidine.  Allergies as of 11/03/2023       Reactions   Alfuzosin    Possible allergic reaction in 2024; does fine with Flomax (Tamsulosin)   Ciprofloxacin Other (See Comments)   Muscle weakness and numbness Other reaction(s): Not available Muscle weakness and numbness Other reaction(s): Not available Other Reaction(s): Other (See Comments) Muscle weakness and numbness Other reaction(s): Not available    Muscle weakness and numbness  Other reaction(s): Not available  Muscle weakness and numbness Other reaction(s): Not available   Compazine Other (See Comments)   hallucinations   Prochlorperazine  Other (See Comments), Rash   Pt states it makes her feel like things are crawling on her Other reaction(s): Not available        Medication List    acetaZOLAMIDE 250 MG tablet Commonly known as: DIAMOX TAKE 1 TABLET EVERY MORNING, TAKE 1 TABLET AT 2PM, AND TAKE 2 TABLETSAT BEDTIME   albuterol 108 (90 Base) MCG/ACT inhaler Commonly known as: Ventolin HFA Inhale 2 puffs into the lungs every 4 (four) hours as needed for wheezing or shortness of breath.   amitriptyline 75 MG tablet Commonly known as: ELAVIL Take 1 tablet (75 mg total) by mouth at bedtime.   atorvastatin 40 MG tablet Commonly known as: LIPITOR TAKE ONE (1) TABLET BY MOUTH EVERY DAY   baclofen 20 MG tablet Commonly known as: LIORESAL TAKE ONE (1) TABLET BY MOUTH FOUR (4) TIMES DAILY   CALCIUM 1200+D3 PO Take 1 tablet by mouth daily.   cetirizine 10 MG tablet Commonly known as: ZYRTEC Take 1 tablet (10 mg total) by mouth 2 (two) times daily as needed for allergies (Can take an extra dose during flare ups.).   cromolyn 4 % ophthalmic solution Commonly known as: OPTICROM SMARTSIG:In Eye(s)   cyanocobalamin 1000 MCG/ML injection Commonly known as: VITAMIN B12 INJECT IM EVERY 30 DAYS   cycloSPORINE 0.05 % ophthalmic emulsion Commonly known as: RESTASIS Place 1 drop into both eyes 2 (two) times daily.   diclofenac 75 MG EC tablet Commonly known as: VOLTAREN TAKE ONE TABLET BY MOUTH TWICE DAILY   docusate sodium 100 MG capsule Commonly known as: COLACE  Take 100 mg by mouth 2 (two) times daily. PRN   EPINEPHrine 0.3 mg/0.3 mL Soaj injection Commonly known as: EPI-PEN INJECT 0.3MG  INTO THE MUSCLE AS NEEDED FOR ANAPHYLAXIS   famotidine 40 MG tablet Commonly known as: PEPCID Take 1 tablet (40 mg total) by mouth at bedtime.   furosemide 20 MG tablet Commonly known as: LASIX Take 1 tablet (20 mg total) by mouth daily.   gabapentin 600 MG tablet Commonly known as: NEURONTIN TAKE 1 TABLET BY  MOUTH 3 TIMES DAILY & TAKE 2 TABLETS AT BEDTIME   ipratropium-albuterol 0.5-2.5 (3) MG/3ML Soln Commonly known as: DUONEB Take 3 mLs by nebulization every 4 (four) hours as needed.   L-Methylfolate 15 MG Tabs Take 1 tablet (15 mg total) by mouth daily.   MAGNESIUM GLYCINATE PO Take 2 tablets by mouth daily.   metoprolol succinate 25 MG 24 hr tablet Commonly known as: TOPROL-XL Take 1 tablet (25 mg total) by mouth daily.   mirabegron ER 25 MG Tb24 tablet Commonly known as: MYRBETRIQ Take 1 tablet (25 mg total) by mouth daily.   mometasone-formoterol 200-5 MCG/ACT Aero Commonly known as: DULERA Inhale 2 puffs into the lungs in the morning and at bedtime.   montelukast 10 MG tablet Commonly known as: SINGULAIR Take 1 tablet (10 mg total) by mouth at bedtime.   mupirocin ointment 2 % Commonly known as: BACTROBAN APPLY A SMALL AMOUNT TO THE AFFECTED AREA BY TOPICAL ROUTE 3 TIMES PER DAY   norethindrone 0.35 MG tablet Commonly known as: MICRONOR Take 1 tablet by mouth daily.   nystatin 100000 UNIT/ML suspension Commonly known as: MYCOSTATIN Use as directed 5 mLs (500,000 Units total) in the mouth or throat 4 (four) times daily.   nystatin cream Commonly known as: MYCOSTATIN Apply 1 application topically 2 (two) times daily.   nystatin-triamcinolone ointment Commonly known as: MYCOLOG Apply 1 application topically 2 (two) times daily.   Omega 3 1000 MG Caps Take 1 capsule by mouth daily.   omeprazole 40 MG capsule Commonly known as: PRILOSEC Take 1 capsule (40 mg total) by mouth 2 (two) times daily.   oxyCODONE-acetaminophen 7.5-325 MG tablet Commonly known as: Percocet Take 1 tablet by mouth 5 (five) times daily as needed for moderate pain (pain score 4-6). No More Than 5 a day.   phenazopyridine 100 MG tablet Commonly known as: Pyridium Take 1 tablet (100 mg total) by mouth 3 (three) times daily as needed for pain.   predniSONE 5 MG tablet Commonly known as:  DELTASONE Take 1 tablet (5 mg total) by mouth every morning.   PROBIOTIC-10 PO Take by mouth.   promethazine 12.5 MG tablet Commonly known as: PHENERGAN TAKE 1 TABLET BY MOUTH EVERY 8 HOURS AS NEEDED FOR NAUSEA & VOMITING   Qnasl 80 MCG/ACT Aers Generic drug: Beclomethasone Dipropionate   tamsulosin 0.4 MG Caps capsule Commonly known as: FLOMAX Take 1 capsule (0.4 mg total) by mouth daily after breakfast.   VITAMIN C ADULT GUMMIES PO Take 2 each by mouth daily.   Vitamin D (Ergocalciferol) 1.25 MG (50000 UNIT) Caps capsule Commonly known as: DRISDOL TAKE 1 CAPSULE BY MOUTH EVERY 7 DAYS   vitamin E 180 MG (400 UNITS) capsule Take 1,000 Units by mouth daily.   ZINC PO Take 1 tablet by mouth daily.    Past Medical History:  Diagnosis Date  . Allergy   . Arthritis    HANDS,HIPS,KNEES  . Asthma   . Bronchitis, chronic/intermittent 01/22/2012  . Depression 01/22/2012  .  GERD (gastroesophageal reflux disease)   . Hyperlipidemia   . IBS (irritable bowel syndrome) 01/22/2012  . Lupus   . Neuromuscular disorder (HCC)   . Osteoporosis   . Seizures (HCC)   . SOB (shortness of breath)   . Thyroid disease     Past Surgical History:  Procedure Laterality Date  . 24 HOUR PH STUDY N/A 10/29/2015   Procedure: 24 HOUR PH STUDY;  Surgeon: Iva Boop, MD;  Location: [facility];  Service: Endoscopy;  Laterality: N/A;  . COLONOSCOPY    . ESOPHAGEAL MANOMETRY N/A 10/29/2015   Procedure: ESOPHAGEAL MANOMETRY (EM);  Surgeon: Iva Boop, MD;  Location: [facility];  Service: Endoscopy;  Laterality: N/A;  . KNEE SURGERY  2002/2003   bil  . RECTAL SURGERY  correction of prolapse   2010    Review of systems negative except as noted in HPI / PMHx or noted below:  Review of Systems  Constitutional: Negative.   HENT: Negative.    Eyes: Negative.   Respiratory: Negative.    Cardiovascular: Negative.   Gastrointestinal: Negative.   Genitourinary: Negative.    Musculoskeletal: Negative.   Skin: Negative.   Neurological: Negative.   Endo/Heme/Allergies: Negative.   Psychiatric/Behavioral: Negative.       Objective:   There were no vitals filed for this visit.        Physical Exam Constitutional:      Appearance: She is not diaphoretic.  HENT:     Head: Normocephalic.     Right Ear: Tympanic membrane, ear canal and external ear normal.     Left Ear: Tympanic membrane, ear canal and external ear normal.     Nose: Nose normal. No mucosal edema or rhinorrhea.     Mouth/Throat:     Pharynx: Uvula midline. No oropharyngeal exudate.  Eyes:     Conjunctiva/sclera: Conjunctivae normal.  Neck:     Thyroid: No thyromegaly.     Trachea: Trachea normal. No tracheal tenderness or tracheal deviation.  Cardiovascular:     Rate and Rhythm: Normal rate and regular rhythm.     Heart sounds: Normal heart sounds, S1 normal and S2 normal. No murmur heard. Pulmonary:     Effort: No respiratory distress.     Breath sounds: Normal breath sounds. No stridor. No wheezing or rales.  Lymphadenopathy:     Head:     Right side of head: No tonsillar adenopathy.     Left side of head: No tonsillar adenopathy.     Cervical: No cervical adenopathy.  Skin:    Findings: No erythema or rash.     Nails: There is no clubbing.  Neurological:     Mental Status: She is alert.     Diagnostics: none  Assessment and Plan:   1. Angioedema, subsequent encounter   2. Allergic reaction, subsequent encounter     Patient Instructions   1.  Allergen avoidance measures  - dust mite, Alfuzosin  2.  Continue to treat and prevent inflammation:  A.  Dulera 200 - 2 inhalations 1-2 times per day w/ spacer  B.  Montelukast 10 mg -1 tablet 1 time per day C.  OTC Rhinocort  - 1-2 sprays each nostril 3-7 times per week  3.  Treat and prevent reflux/LPR:  A.  Minimize all forms of caffeine consumption B.  Omeprazole 40 mg -1 tablet twice a day C.  Famotidine 40 mg -1  tablet in evening D.  Replace throat clearing with swallowing maneuver  4.  If needed:  A. Albuterol HFA - 2 inhalations or nebulization every 4-6 hours B. Antihistamine - cetirizine 10 mg - 1-2 tabs 1-2 times per day  C. Epi-pen  5. Follow through with sleep study  6. Evaluation of swelling events:  A. Blood - C1 esterase inhibitor level and function, C4, tryptase B. Further evaluation???  7. Return to clinic in 4 weeks or earlier if problem  Laurette Schimke, MD Allergy / Immunology [facility]

## 2023-11-04 ENCOUNTER — Encounter: Payer: Self-pay | Admitting: Allergy and Immunology

## 2023-11-05 ENCOUNTER — Encounter: Payer: Self-pay | Admitting: Allergy and Immunology

## 2023-11-06 LAB — C1 ESTERASE INHIBITOR, FUNCTIONAL: C1INH Functional/C1INH Total MFr SerPl: >110 %{normal}

## 2023-11-06 LAB — C4 COMPLEMENT: Complement C4, Serum: 20 mg/dL (ref 12–38)

## 2023-11-06 LAB — TRYPTASE: Tryptase: 4.7 ug/L (ref 2.2–13.2)

## 2023-11-13 ENCOUNTER — Ambulatory Visit: Payer: Medicare Other | Admitting: Registered Nurse

## 2023-11-16 ENCOUNTER — Encounter: Payer: Medicare Other | Admitting: Registered Nurse

## 2023-11-19 ENCOUNTER — Other Ambulatory Visit: Payer: Self-pay | Admitting: Nurse Practitioner

## 2023-11-19 DIAGNOSIS — E782 Mixed hyperlipidemia: Secondary | ICD-10-CM

## 2023-11-20 ENCOUNTER — Telehealth (INDEPENDENT_AMBULATORY_CARE_PROVIDER_SITE_OTHER): Payer: 59 | Admitting: Nurse Practitioner

## 2023-11-20 ENCOUNTER — Encounter: Payer: Self-pay | Admitting: Nurse Practitioner

## 2023-11-20 DIAGNOSIS — M791 Myalgia, unspecified site: Secondary | ICD-10-CM | POA: Diagnosis not present

## 2023-11-20 MED ORDER — PREDNISONE 10 MG (21) PO TBPK: ORAL_TABLET | ORAL | 0 refills | Status: DC

## 2023-11-20 NOTE — Patient Instructions (Signed)
 Muscle Pain, Adult Muscle pain, also called myalgia, is a condition in which a person has pain in one or more muscles in the body. The pain may be mild, moderate, or severe. It may feel sharp, achy, or burning. In most cases, the pain lasts only a short time and goes away on its own. It is normal to feel some muscle pain after you start a new exercise program. Muscles that have not been used a lot will be sore at first. What are the causes? You may have muscle pain when you use your muscles in a new or different way after not having used them for some time. Muscle pain can also be caused by overuse or by stretching a muscle beyond its normal length (muscle strain). You may be more likely to have muscle pain if you are not in shape. Other causes may include: Injury or bruising. Infectious diseases. These include diseases caused by viruses, such as the flu (influenza). Fibromyalgia. This is a long-term (chronic) condition that causes muscle tenderness, tiredness (fatigue), and headache. Autoimmune or rheumatologic diseases. These are conditions, such as lupus, that cause the body's defense system (immune system) to attack areas in the body. Certain medicines. These include ACE inhibitors and statins. What are the signs or symptoms? The main symptom is sore or painful muscles. Your muscles may be sore when you do activities and when you stretch. You may also have slight swelling. How is this diagnosed? Muscle pain is diagnosed with a physical exam. Your health care provider will ask questions about your pain and when it began. If you have not had muscle pain for very long, your provider may want to wait before doing much testing. If your muscle pain has lasted a long time, tests may be done right away. In some cases, you may need tests to rule out other conditions and diseases. How is this treated? Treatment for muscle pain depends on the cause. Home care is usually enough to relieve the pain. Your  provider may also prescribe NSAIDs, such as ibuprofen. Follow these instructions at home: Medicines Take over-the-counter and prescription medicines only as told by your provider. Ask your health care provider if the medicine prescribed to you requires you to avoid driving or using machinery. Managing pain, swelling, and discomfort     If told, put ice on the painful area for the first 2 days of soreness. Put ice in a plastic bag. Place a towel between your skin and the bag. Leave the ice on for 20 minutes, 2-3 times a day. If your skin turns bright red, remove the ice right away to prevent skin damage. The risk of damage is higher if you cannot feel pain, heat, or cold. For the first 2 days of muscle soreness, or if there is swelling: Do not soak in hot baths. Do not use a hot tub, steam room, sauna, heating pad, or other heat source. After 2-3 days, you may switch between putting ice and heat on the area. If told, apply heat to the affected area as often as told by your provider. Use the heat source that your recommends, such as a moist heat pack or a heating pad. Place a towel between your skin and the heat source. Leave the heat on for 20-30 minutes. If your skin turns bright red, remove the ice or heat right away to prevent skin damage. The risk of damage is higher if you cannot feel pain, heat, or cold. If you have an injury,  raise (elevate) the injured area above the level of your heart while you are sitting or lying down. Activity  If your muscle pain is caused by overuse: Slow down your activities until the pain goes away. Do regular, gentle exercises if you are not normally active. Warm up before you exercise. Stretch before and after you exercise. This can help lower the risk of muscle pain. Do not keep working out if the pain is severe. Severe pain could mean that you have injured a muscle. You may have to avoid lifting. Ask your provider how much you can safely lift. Return  to your normal activities as told by your provider. Ask your provider what activities are safe for you. General instructions Do not use any products that contain nicotine or tobacco. These products include cigarettes, chewing tobacco, and vaping devices, such as e-cigarettes. If you need help quitting, ask your provider. Contact a health care provider if: Your muscle pain gets worse, and medicines do not help. The muscle pain lasts longer than 3 days. You have a rash or fever. You have muscle pain after a tick bite. You have muscle pain when you work out, even though you are in good shape. You have redness, soreness, or swelling. You have muscle pain after you start a new medicine or change the dose of a medicine. Get help right away if: You have trouble breathing. You have trouble swallowing. You have muscle pain along with a stiff neck, fever, and vomiting. You have severe muscle weakness, or you cannot move part of your body. You are urinating less, or you have dark, bloody, or discolored urine. You have redness or swelling at the site of the muscle pain. These symptoms may be an emergency. Get help right away. Call 911. Do not wait to see if the symptoms will go away. Do not drive yourself to the hospital. This information is not intended to replace advice given to you by your health care provider. Make sure you discuss any questions you have with your health care provider. Document Revised: 06/13/2022 Document Reviewed: 06/13/2022 Elsevier Patient Education  2024 [employer].

## 2023-11-20 NOTE — Progress Notes (Signed)
 Virtual Visit Consent   Autumn Davis, you are scheduled for a virtual visit with Mary-Margaret Gladis, FNP, a [facility] provider, today.     Just as with appointments in the office, your consent must be obtained to participate.  Your consent will be active for this visit and any virtual visit you may have with one of our providers in the next 365 days.     If you have a MyChart account, a copy of this consent can be sent to you electronically.  All virtual visits are billed to your insurance company just like a traditional visit in the office.    As this is a virtual visit, video technology does not allow for your provider to perform a traditional examination.  This may limit your provider's ability to fully assess your condition.  If your provider identifies any concerns that need to be evaluated in person or the need to arrange testing (such as labs, EKG, etc.), we will make arrangements to do so.     Although advances in technology are sophisticated, we cannot ensure that it will always work on either your end or our end.  If the connection with a video visit is poor, the visit may have to be switched to a telephone visit.  With either a video or telephone visit, we are not always able to ensure that we have a secure connection.     I need to obtain your verbal consent now.   Are you willing to proceed with your visit today? YES   Autumn Davis has provided verbal consent on 11/20/2023 for a virtual visit (video or telephone).   Mary-Margaret Gladis, FNP   Date: 11/20/2023 4:10 PM   Virtual Visit via Video Note   I, Mary-Margaret Gladis, connected with Autumn Davis ([id number], Aug 10, 1986) on 11/20/23 at  3:45 PM EST by a video-enabled telemedicine application and verified that I am speaking with the correct person using two identifiers.  Location: Patient: Virtual Visit Location Patient: Home Provider: Virtual Visit Location Provider: Mobile   I discussed  the limitations of evaluation and management by telemedicine and the availability of in person appointments. The patient expressed understanding and agreed to proceed.    History of Present Illness: Autumn Davis is a 38 y.o. who identifies as a female who was assigned female at birth, and is being seen today for autoimmune flare up.  HPI: Patient has un specified auto immune disorder. She is on lots of meds as well as pain meds. Occasionally she will have a flare up where she is not able to do anything. Having lots of joint pains with difficulty walking. Has been in bed for several days. She reached out to pain management but they could not see her. She says she us  very fatigued. Her uvula feels funny when she swallows. She says she can hardly do anything. Trouble with ADL's because she is just so fatigued. Rates all over joint pain as 8-9/10.    Review of Systems  Constitutional:  Positive for malaise/fatigue. Negative for chills and fever.  Musculoskeletal:  Positive for myalgias.    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  Lupus (systemic lupus erythematosus) (HCC) 08/29/2014   Fibromyalgia 12/14/2013   Muscle spasticity 11/14/2013   Hyperlipidemia 08/26/2012   IBS (irritable bowel syndrome) 01/22/2012   Depression 01/22/2012   GERD (gastroesophageal reflux disease)    Seizures (HCC)    Thyroid  disease     Allergies:  Allergies  Allergen Reactions   Alfuzosin     Possible allergic reaction in 2024; does fine with Flomax  (Tamsulosin )   Ciprofloxacin  Other (See Comments)    Muscle weakness and numbness  Other reaction(s): Not  available  Muscle weakness and numbness Other reaction(s): Not available  Other Reaction(s): Other (See Comments)  Muscle weakness and numbness Other reaction(s): Not available    Muscle weakness and numbness  Other reaction(s): Not available  Muscle weakness and numbness Other reaction(s): Not available   Compazine Other (See Comments)    hallucinations   Prochlorperazine Other (See Comments) and Rash    Pt states it makes her feel like things are crawling on her Other reaction(s): Not available   Medications:  Current Outpatient Medications:    acetaZOLAMIDE  (DIAMOX ) 250 MG tablet, TAKE 1 TABLET EVERY MORNING, TAKE 1 TABLET AT 2PM, AND TAKE 2 TABLETSAT BEDTIME, Disp: 360 tablet, Rfl: 1   albuterol  (VENTOLIN  HFA) 108 (90 Base) MCG/ACT inhaler, Inhale 2 puffs into the lungs every 4 (four) hours as needed for wheezing or shortness of breath., Disp: 18 g, Rfl: 1   amitriptyline  (ELAVIL ) 75 MG tablet, Take 1 tablet (75 mg total) by mouth at bedtime., Disp: 90 tablet, Rfl: 1   Ascorbic Acid (VITAMIN C ADULT GUMMIES PO), Take 2 each by mouth daily., Disp: , Rfl:    atorvastatin  (LIPITOR) 40 MG tablet, TAKE ONE (1) TABLET BY MOUTH EVERY DAY, Disp: 90 tablet, Rfl: 0   baclofen  (LIORESAL ) 20 MG tablet, TAKE ONE (1) TABLET BY MOUTH FOUR (4) TIMES DAILY, Disp: 360 each, Rfl: 1   Beclomethasone Dipropionate (QNASL) 80 MCG/ACT AERS, , Disp: , Rfl:    Calcium -Magnesium-Vitamin D  (CALCIUM  1200+D3 PO), Take 1 tablet by mouth daily., Disp: , Rfl:    cetirizine  (ZYRTEC ) 10 MG tablet, Take 1 tablet (10 mg total) by mouth 2 (two) times daily as needed for allergies (Can take an extra dose during flare ups.)., Disp: 60 tablet, Rfl: 5   cromolyn (OPTICROM) 4 % ophthalmic solution, SMARTSIG:In Eye(s), Disp: , Rfl:    cyanocobalamin  (VITAMIN B12) 1000 MCG/ML injection, INJECT 1ML IM EVERY 30 DAYS, Disp: 3 mL, Rfl: 0   cycloSPORINE (RESTASIS) 0.05 % ophthalmic emulsion, Place 1 drop into both eyes 2 (two) times  daily., Disp: , Rfl:    diclofenac  (VOLTAREN ) 75 MG EC tablet, TAKE ONE TABLET BY MOUTH TWICE DAILY, Disp: 180 tablet, Rfl: 0   docusate sodium (COLACE) 100 MG capsule, Take 100 mg by mouth 2 (two) times daily. PRN (Patient not taking: Reported on 11/03/2023), Disp: , Rfl:    EPINEPHRINE  0.3 mg/0.3 mL IJ SOAJ injection, INJECT 0.3MG  INTO THE MUSCLE AS NEEDED FOR ANAPHYLAXIS, Disp: 2 each, Rfl: 2   famotidine  (PEPCID ) 40 MG tablet, Take 1 tablet (40 mg total) by mouth every evening., Disp: 30 tablet, Rfl: 5   furosemide  (LASIX ) 20 MG tablet, Take 1 tablet (20 mg total) by mouth daily., Disp: 90 tablet, Rfl: 1   gabapentin  (NEURONTIN ) 600 MG tablet, TAKE 1 TABLET BY MOUTH 3 TIMES DAILY & TAKE 2 TABLETS AT BEDTIME, Disp: 450 tablet, Rfl: 1   ipratropium-albuterol  (DUONEB) 0.5-2.5 (3) MG/3ML SOLN, Take 3 mLs by nebulization  every 4 (four) hours as needed., Disp: 360 mL, Rfl: 4   L-Methylfolate 15 MG TABS, Take 1 tablet (15 mg total) by mouth daily., Disp: 90 tablet, Rfl: 1   MAGNESIUM GLYCINATE PO, Take 2 tablets by mouth daily., Disp: , Rfl:    metoprolol  succinate (TOPROL -XL) 25 MG 24 hr tablet, Take 1 tablet (25 mg total) by mouth daily., Disp: 90 tablet, Rfl: 1   mirabegron  ER (MYRBETRIQ ) 25 MG TB24 tablet, Take 1 tablet (25 mg total) by mouth daily., Disp: 30 tablet, Rfl: 11   mometasone -formoterol  (DULERA) 200-5 MCG/ACT AERO, 2 inhalations 1-2 times per day w/ spacer, Disp: 13 g, Rfl: 5   montelukast  (SINGULAIR ) 10 MG tablet, Take 1 tablet (10 mg total) by mouth at bedtime., Disp: 90 tablet, Rfl: 1   Multiple Vitamins-Minerals (ZINC PO), Take 1 tablet by mouth daily., Disp: , Rfl:    mupirocin  ointment (BACTROBAN ) 2 %, APPLY A SMALL AMOUNT TO THE AFFECTED AREA BY TOPICAL ROUTE 3 TIMES PER DAY, Disp: , Rfl:    norethindrone (MICRONOR) 0.35 MG tablet, Take 1 tablet by mouth daily., Disp: , Rfl:    nystatin  (MYCOSTATIN ) 100000 UNIT/ML suspension, Use as directed 5 mLs (500,000 Units total) in the  mouth or throat 4 (four) times daily., Disp: 60 mL, Rfl: 2   nystatin  cream (MYCOSTATIN ), Apply 1 application topically 2 (two) times daily., Disp: 30 g, Rfl: 2   nystatin -triamcinolone  ointment (MYCOLOG), Apply 1 application topically 2 (two) times daily., Disp: 30 g, Rfl: 0   Omega 3 1000 MG CAPS, Take 1 capsule by mouth daily., Disp: , Rfl:    omeprazole  (PRILOSEC) 40 MG capsule, Take 1 capsule (40 mg total) by mouth 2 (two) times daily., Disp: 60 capsule, Rfl: 5   oxyCODONE -acetaminophen  (PERCOCET) 7.5-325 MG tablet, Take 1 tablet by mouth 5 (five) times daily as needed for moderate pain (pain score 4-6). No More Than 5 a day., Disp: 140 tablet, Rfl: 0   phenazopyridine  (PYRIDIUM ) 100 MG tablet, Take 1 tablet (100 mg total) by mouth 3 (three) times daily as needed for pain. (Patient not taking: Reported on 11/03/2023), Disp: 10 tablet, Rfl: 0   predniSONE  (DELTASONE ) 5 MG tablet, Take 1 tablet (5 mg total) by mouth every morning., Disp: 90 tablet, Rfl: 1   Probiotic Product (PROBIOTIC-10 PO), Take by mouth., Disp: , Rfl:    promethazine  (PHENERGAN ) 12.5 MG tablet, TAKE 1 TABLET BY MOUTH EVERY 8 HOURS AS NEEDED FOR NAUSEA & VOMITING, Disp: 30 tablet, Rfl: 0   tamsulosin  (FLOMAX ) 0.4 MG CAPS capsule, Take 1 capsule (0.4 mg total) by mouth daily after breakfast., Disp: 90 capsule, Rfl: 1   Vitamin D , Ergocalciferol , (DRISDOL ) 1.25 MG (50000 UNIT) CAPS capsule, TAKE 1 CAPSULE BY MOUTH EVERY 7 DAYS, Disp: 12 capsule, Rfl: 1   vitamin E 180 MG (400 UNITS) capsule, Take 1,000 Units by mouth daily. , Disp: , Rfl:   Observations/Objective: Patient is well-developed, well-nourished in no acute distress.  Resting comfortably  at home.  Head is normocephalic, atraumatic.  No labored breathing.  Speech is clear and coherent with logical content.  Patient is alert and oriented at baseline.  Cannot see uvula in video Denies neck nodules   Assessment and Plan:  Autumn Davis in today with  chief complaint of auto immune flare up   1. Myalgia (Primary) Moist heat Rest RTO prn  Meds ordered this encounter  Medications   predniSONE  (STERAPRED UNI-PAK 21 TAB) 10 MG (21) TBPK  tablet    Sig: As directed x 6 days    Dispense:  21 tablet    Refill:  0    Supervising Provider:   MARYANNE CHEW A [[id number]]     Follow Up Instructions: I discussed the assessment and treatment plan with the patient. The patient was provided an opportunity to ask questions and all were answered. The patient agreed with the plan and demonstrated an understanding of the instructions.  A copy of instructions were sent to the patient via MyChart.  The patient was advised to call back or seek an in-person evaluation if the symptoms worsen or if the condition fails to improve as anticipated.  Time:  I spent 10 minutes with the patient via telehealth technology discussing the above problems/concerns.    Mary-Margaret Gladis, FNP

## 2023-11-24 ENCOUNTER — Encounter: Payer: 59 | Attending: Registered Nurse | Admitting: Registered Nurse

## 2023-11-24 VITALS — BP 134/87 | HR 73 | Ht 68.0 in | Wt 230.0 lb

## 2023-11-24 DIAGNOSIS — G8929 Other chronic pain: Secondary | ICD-10-CM

## 2023-11-24 DIAGNOSIS — G609 Hereditary and idiopathic neuropathy, unspecified: Secondary | ICD-10-CM | POA: Diagnosis not present

## 2023-11-24 DIAGNOSIS — M546 Pain in thoracic spine: Secondary | ICD-10-CM | POA: Insufficient documentation

## 2023-11-24 DIAGNOSIS — M62838 Other muscle spasm: Secondary | ICD-10-CM | POA: Diagnosis not present

## 2023-11-24 DIAGNOSIS — Z5181 Encounter for therapeutic drug level monitoring: Secondary | ICD-10-CM

## 2023-11-24 DIAGNOSIS — Z79891 Long term (current) use of opiate analgesic: Secondary | ICD-10-CM | POA: Diagnosis not present

## 2023-11-24 DIAGNOSIS — G894 Chronic pain syndrome: Secondary | ICD-10-CM

## 2023-11-24 DIAGNOSIS — M2550 Pain in unspecified joint: Secondary | ICD-10-CM

## 2023-11-24 DIAGNOSIS — M797 Fibromyalgia: Secondary | ICD-10-CM | POA: Diagnosis not present

## 2023-11-24 MED ORDER — OXYCODONE-ACETAMINOPHEN 7.5-325 MG PO TABS
1.0000 | ORAL_TABLET | Freq: Every day | ORAL | 0 refills | Status: DC | PRN
Start: 2023-11-24 — End: 2024-01-22

## 2023-11-24 MED ORDER — OXYCODONE-ACETAMINOPHEN 7.5-325 MG PO TABS: 1.0000 | ORAL_TABLET | Freq: Every day | ORAL | 0 refills | Status: DC | PRN

## 2023-11-24 NOTE — Progress Notes (Signed)
 Subjective:    Patient ID: Autumn Davis, female    DOB: August 11, 1986, 38 y.o.   MRN: [id number]  HPI: Autumn Davis is a 38 y.o. female who returns for follow up appointment for chronic pain and medication refill. She states her pain is located in her mid- back and generalized joint pain. She rates her pain 8. Her current exercise regime is walking and performing stretching exercises.  [name]  equivalent is 33.75 MME.   Last Oral Swab was Performed 09/11/2023, it was consistent.    Pain Inventory Average Pain 8 Pain Right Now 8 My pain is constant, sharp, burning, stabbing, tingling, and aching  In the last 24 hours, has pain interfered with the following? General activity 9 Relation with others 9 Enjoyment of life 9 What TIME of day is your pain at its worst? evening and night Sleep (in general) Poor  Pain is worse with: walking, bending, sitting, inactivity, standing, and unsure Pain improves with: rest, heat/ice, therapy/exercise, pacing activities, medication, and TENS Relief from Meds: 4  Family History  Problem Relation Age of Onset   Thyroid  disease Mother    Colon polyps Father    Lung cancer Maternal Grandmother    Cancer Paternal Grandmother        colon/pancreatiec/lymphoma   Irritable bowel syndrome Brother    AAA (abdominal aortic aneurysm) Neg Hx    Social History   Socioeconomic History   Marital status: Legally Separated    Spouse name: Not on file   Number of children: 1   Years of education: Not on file   Highest education level: Not on file  Occupational History   Occupation: disabled    Employer: [employer]  Tobacco Use   Smoking status: Never   Smokeless tobacco: Never  Vaping Use   Vaping status: Never Used  Substance and Sexual Activity   Alcohol  use: No    Alcohol /week: 0.0 standard drinks of alcohol    Drug use: No   Sexual activity: Not Currently  Other Topics Concern   Not on file   Social History Narrative   She lives alone with her 42 year old son - right now - they are living with her parents right now   Social Drivers of Corporate Investment Banker Strain: Low Risk  (05/05/2023)   Overall Financial Resource Strain (CARDIA)    Difficulty of Paying Living Expenses: Not hard at all  Food Insecurity: No Food Insecurity (05/05/2023)   Hunger Vital Sign    Worried About Running Out of Food in the Last Year: Never true    Ran Out of Food in the Last Year: Never true  Transportation Needs: No Transportation Needs (05/05/2023)   PRAPARE - Administrator, Civil Service (Medical): No    Lack of Transportation (Non-Medical): No  Physical Activity: Insufficiently Active (05/05/2023)   Exercise Vital Sign    Days of Exercise per Week: 2 days    Minutes of Exercise per Session: 30 min  Stress: No Stress Concern Present (05/05/2023)   [employer] of Occupational Health - Occupational Stress Questionnaire    Feeling of Stress : Not at all  Social Connections: Moderately Isolated (05/05/2023)   Social Connection and Isolation Panel [NHANES]    Frequency of Communication with Friends and Family: More than three times a week    Frequency of Social Gatherings with Friends and Family: More than three times a week    Attends  Religious Services: More than 4 times per year    Active Member of Clubs or Organizations: No    Attends Banker Meetings: Never    Marital Status: Separated   Past Surgical History:  Procedure Laterality Date   60 HOUR PH STUDY N/A 10/29/2015   Procedure: 24 HOUR PH STUDY;  Surgeon: Lupita FORBES Commander, MD;  Location: [facility];  Service: Endoscopy;  Laterality: N/A;   COLONOSCOPY     ESOPHAGEAL MANOMETRY N/A 10/29/2015   Procedure: ESOPHAGEAL MANOMETRY (EM);  Surgeon: Lupita FORBES Commander, MD;  Location: [facility];  Service: Endoscopy;  Laterality: N/A;   KNEE SURGERY  2002/2003   bil   RECTAL SURGERY  correction of prolapse    2010   Past Surgical History:  Procedure Laterality Date   36 HOUR PH STUDY N/A 10/29/2015   Procedure: 24 HOUR PH STUDY;  Surgeon: Lupita FORBES Commander, MD;  Location: [facility];  Service: Endoscopy;  Laterality: N/A;   COLONOSCOPY     ESOPHAGEAL MANOMETRY N/A 10/29/2015   Procedure: ESOPHAGEAL MANOMETRY (EM);  Surgeon: Lupita FORBES Commander, MD;  Location: [facility];  Service: Endoscopy;  Laterality: N/A;   KNEE SURGERY  2002/2003   bil   RECTAL SURGERY  correction of prolapse   2010   Past Medical History:  Diagnosis Date   Allergy    Arthritis    HANDS,HIPS,KNEES   Asthma    Bronchitis, chronic/intermittent 01/22/2012   Depression 01/22/2012   GERD (gastroesophageal reflux disease)    Hyperlipidemia    IBS (irritable bowel syndrome) 01/22/2012   Lupus    Neuromuscular disorder (HCC)    Osteoporosis    Seizures (HCC)    SOB (shortness of breath)    Thyroid  disease    BP 134/87   Pulse 73   Ht 5' 8 (1.727 m)   Wt 230 lb (104.3 kg)   SpO2 98%   BMI 34.97 kg/m   Opioid Risk Score:   Fall Risk Score:  `1  Depression screen [facility] 2/9     09/11/2023   10:34 AM 06/02/2023    3:43 PM 05/05/2023    9:49 AM 12/08/2022   11:19 AM 11/26/2022    4:14 PM 07/28/2022   11:31 AM 05/22/2022   12:20 PM  Depression screen PHQ 2/9  Decreased Interest 0 0 0 0 1 0 1  Down, Depressed, Hopeless 0 0 0 0 0 0 0  PHQ - 2 Score 0 0 0 0 1 0 1  Altered sleeping  3   1  1   Tired, decreased energy  1   1  1   Change in appetite  1   1  0  Feeling bad or failure about yourself   0   0  0  Trouble concentrating  0   0  0  Moving slowly or fidgety/restless  0   0  0  Suicidal thoughts  0   0  0  PHQ-9 Score  5   4  3   Difficult doing work/chores  Somewhat difficult   Somewhat difficult  Not difficult at all      Review of Systems  Musculoskeletal:  Positive for back pain and gait problem.       B/L hand and foot pain   All other systems reviewed and are negative.     Objective:   Physical  Exam Vitals and nursing note reviewed.  Constitutional:      Appearance: Normal appearance.  Cardiovascular:  Rate and Rhythm: Normal rate and regular rhythm.     Pulses: Normal pulses.     Heart sounds: Normal heart sounds.  Pulmonary:     Effort: Pulmonary effort is normal.     Breath sounds: Normal breath sounds.  Musculoskeletal:     Comments: Normal Muscle Bulk and Muscle Testing Reveals:  Upper Extremities: Full ROM and Muscle Strength 5/5  Thoracic Paraspinal Tenderness: T-7-T-9  Lower Extremities: Full ROM and Muscle Strength 5/5 Arises from Chair slowly Narrow Based  Gait     Skin:    General: Skin is warm and dry.  Neurological:     Mental Status: She is alert and oriented to person, place, and time.         Assessment & Plan:  1. Chronic seizure disorder: No seizure's. Continue current medication regimen with  Gabapentin . Neurology Following. 11/24/2023. 2. Chronic muscle spasms, weakness with associated pain disorder: Continue current medication regimen with Baclofen . Continue with Exercise regime. 11/24/2023. 3. Chronic dysphagia: No complaints today. Continue to monitor. GI Following. 11/24/2023. 4. Anxiety with depression : No complaints today. Stable. Continue current medication regimen with  Elavil . 11/24/2023. 5. Fibromyalgia/ Rib Pain/Chronic Pain: Continue Diclofenac : Continue with exercise and heat Therapy. 11/24/2023. Refilled:  oxyCODONE  7.5/325mg  one tablet 5 times  daily as needed  #140. Second script sent for the following month. Continue with slow weaning.   We will continue the opioid monitoring program, this consists of regular clinic visits, examinations, urine drug screen, pill counts as well as use of [place]  Controlled Substance Reporting system. A 12 month History has been reviewed on the [place]  Controlled Substance Reporting System 11/24/2023.  6. Peripheral Neuropathy: Continue current medication regimen with  Gabapentin :  11/24/2023. 7. Polyarthralgia: Continue HEP as Tolerated. Continue to Monitor.     F/U in 2 months

## 2023-11-28 ENCOUNTER — Encounter: Payer: Self-pay | Admitting: Registered Nurse

## 2023-11-30 ENCOUNTER — Other Ambulatory Visit: Payer: Self-pay | Admitting: Nurse Practitioner

## 2023-11-30 DIAGNOSIS — Z7189 Other specified counseling: Secondary | ICD-10-CM | POA: Diagnosis not present

## 2023-11-30 DIAGNOSIS — M810 Age-related osteoporosis without current pathological fracture: Secondary | ICD-10-CM | POA: Diagnosis not present

## 2023-11-30 DIAGNOSIS — Z1231 Encounter for screening mammogram for malignant neoplasm of breast: Secondary | ICD-10-CM

## 2023-12-14 ENCOUNTER — Ambulatory Visit: Payer: Medicare Other | Admitting: Nurse Practitioner

## 2023-12-15 ENCOUNTER — Ambulatory Visit (INDEPENDENT_AMBULATORY_CARE_PROVIDER_SITE_OTHER): Payer: 59 | Admitting: Allergy and Immunology

## 2023-12-15 ENCOUNTER — Other Ambulatory Visit: Payer: Self-pay

## 2023-12-15 ENCOUNTER — Encounter: Payer: Self-pay | Admitting: Allergy and Immunology

## 2023-12-15 VITALS — BP 134/86 | HR 92 | Temp 97.8°F | Resp 16 | Ht 68.0 in | Wt 235.8 lb

## 2023-12-15 DIAGNOSIS — J383 Other diseases of vocal cords: Secondary | ICD-10-CM | POA: Diagnosis not present

## 2023-12-15 DIAGNOSIS — J4540 Moderate persistent asthma, uncomplicated: Secondary | ICD-10-CM

## 2023-12-15 DIAGNOSIS — K219 Gastro-esophageal reflux disease without esophagitis: Secondary | ICD-10-CM

## 2023-12-15 DIAGNOSIS — J3089 Other allergic rhinitis: Secondary | ICD-10-CM | POA: Diagnosis not present

## 2023-12-15 DIAGNOSIS — T783XXD Angioneurotic edema, subsequent encounter: Secondary | ICD-10-CM

## 2023-12-15 MED ORDER — IPRATROPIUM-ALBUTEROL 0.5-2.5 (3) MG/3ML IN SOLN: 3.0000 mL | RESPIRATORY_TRACT | 2 refills | Status: AC | PRN

## 2023-12-15 NOTE — Progress Notes (Unsigned)
[place] - [place] - [place] - [name] - [name]   Follow-up Note  Referring Provider: [name], * Primary Provider: [name], FNP Date of Office Visit: 12/15/2023  Subjective:   Autumn Davis (DOB: 12/31/1985) is a 38 y.o. female who [facility] in re-evaluation of the following:  HPI: Autumn Davis returns to this clinic in evaluation of asthma, vocal cord dysfunction, laryngospasm, and recurrent episodes of angioedema.  I last saw her in this clinic 03 November 2023.  When I last saw her in this clinic she stated that she was having recurrent episodes of uvula swelling and apparently she then progressed to develop total body swelling especially her joints and she was placed on a tapering dose of prednisone in addition to her chronic systemic steroid dose of 5 mg daily and everything went away.  Unfortunately, this weekend she once again developed "raspy voice" and her hands are starting to hurt again.  In looking back at possible drug reaction the only new addition that she has had was Myrbetriq which was introduced apparently the beginning of 2024.  She also states that she was having problems with her breathing prior to starting her steroid taper with lots of coughing and wheezing and had to use a bronchodilator on a pretty consistent basis.  She has had very little problems with her nose.  She has continuous problems with reflux even while using omeprazole and famotidine.  She did not obtain the sleep study requested by pulmonology.  Allergies as of 12/15/2023       Reactions   Alfuzosin    Possible allergic reaction in 2024; does fine with Flomax (Tamsulosin)   Ciprofloxacin Other (See Comments)   Muscle weakness and numbness Other reaction(s): Not available Muscle weakness and numbness Other reaction(s): Not available Other Reaction(s): Other (See Comments) Muscle weakness and numbness Other  reaction(s): Not available    Muscle weakness and numbness  Other reaction(s): Not available  Muscle weakness and numbness Other reaction(s): Not available   Compazine Other (See Comments)   hallucinations   Prochlorperazine Other (See Comments), Rash   Pt states it makes her feel like things are crawling on her Other reaction(s): Not available        Medication List    acetaZOLAMIDE 250 MG tablet Commonly known as: DIAMOX TAKE 1 TABLET EVERY MORNING, TAKE 1 TABLET AT 2PM, AND TAKE 2 TABLETSAT BEDTIME   albuterol 108 (90 Base) MCG/ACT inhaler Commonly known as: Ventolin HFA Inhale 2 puffs into the lungs every 4 (four) hours as needed for wheezing or shortness of breath.   amitriptyline 75 MG tablet Commonly known as: ELAVIL Take 1 tablet (75 mg total) by mouth at bedtime.   atorvastatin 40 MG tablet Commonly known as: LIPITOR TAKE ONE (1) TABLET BY MOUTH EVERY DAY   baclofen 20 MG tablet Commonly known as: LIORESAL TAKE ONE (1) TABLET BY MOUTH FOUR (4) TIMES DAILY   CALCIUM 1200+D3 PO Take 1 tablet by mouth daily.   cetirizine 10 MG tablet Commonly known as: ZYRTEC Take 1 tablet (10 mg total) by mouth 2 (two) times daily as needed for allergies (Can take an extra dose during flare ups.).   cromolyn 4 % ophthalmic solution Commonly known as: OPTICROM SMARTSIG:In Eye(s)   cyanocobalamin 1000 MCG/ML injection Commonly known as: VITAMIN B12 INJECT IM EVERY 30 DAYS   cycloSPORINE 0.05 % ophthalmic emulsion Commonly known as: RESTASIS Place 1 drop into both  eyes 2 (two) times daily.   diclofenac 75 MG EC tablet Commonly known as: VOLTAREN TAKE ONE TABLET BY MOUTH TWICE DAILY   docusate sodium 100 MG capsule Commonly known as: COLACE Take 100 mg by mouth 2 (two) times daily. PRN   EPINEPHrine 0.3 mg/0.3 mL Soaj injection Commonly known as: EPI-PEN INJECT 0.3MG  INTO THE MUSCLE AS NEEDED FOR ANAPHYLAXIS   famotidine 40 MG tablet Commonly known as:  PEPCID Take 1 tablet (40 mg total) by mouth every evening.   furosemide 20 MG tablet Commonly known as: LASIX Take 1 tablet (20 mg total) by mouth daily.   gabapentin 600 MG tablet Commonly known as: NEURONTIN TAKE 1 TABLET BY MOUTH 3 TIMES DAILY & TAKE 2 TABLETS AT BEDTIME   ipratropium-albuterol 0.5-2.5 (3) MG/3ML Soln Commonly known as: DUONEB Take 3 mLs by nebulization every 4 (four) hours as needed.   L-Methylfolate 15 MG Tabs Take 1 tablet (15 mg total) by mouth daily.   MAGNESIUM GLYCINATE PO Take 2 tablets by mouth daily.   metoprolol succinate 25 MG 24 hr tablet Commonly known as: TOPROL-XL Take 1 tablet (25 mg total) by mouth daily.   mirabegron ER 25 MG Tb24 tablet Commonly known as: MYRBETRIQ Take 1 tablet (25 mg total) by mouth daily.   mometasone-formoterol 200-5 MCG/ACT Aero Commonly known as: DULERA 2 inhalations 1-2 times per day w/ spacer   montelukast 10 MG tablet Commonly known as: SINGULAIR Take 1 tablet (10 mg total) by mouth at bedtime.   mupirocin ointment 2 % Commonly known as: BACTROBAN APPLY A SMALL AMOUNT TO THE AFFECTED AREA BY TOPICAL ROUTE 3 TIMES PER DAY   norethindrone 0.35 MG tablet Commonly known as: MICRONOR Take 1 tablet by mouth daily.   nystatin 100000 UNIT/ML suspension Commonly known as: MYCOSTATIN Use as directed 5 mLs (500,000 Units total) in the mouth or throat 4 (four) times daily.   nystatin cream Commonly known as: MYCOSTATIN Apply 1 application topically 2 (two) times daily.   nystatin-triamcinolone ointment Commonly known as: MYCOLOG Apply 1 application topically 2 (two) times daily.   Omega 3 1000 MG Caps Take 1 capsule by mouth daily.   omeprazole 40 MG capsule Commonly known as: PRILOSEC Take 1 capsule (40 mg total) by mouth 2 (two) times daily.   oxyCODONE-acetaminophen 7.5-325 MG tablet Commonly known as: Percocet Take 1 tablet by mouth 5 (five) times daily as needed for moderate pain (pain score  4-6). No More Than 5 a day.   phenazopyridine 100 MG tablet Commonly known as: Pyridium Take 1 tablet (100 mg total) by mouth 3 (three) times daily as needed for pain.   predniSONE 10 MG (21) Tbpk tablet Commonly known as: STERAPRED UNI-PAK 21 TAB As directed x 6 days   PROBIOTIC-10 PO Take by mouth.   promethazine 12.5 MG tablet Commonly known as: PHENERGAN TAKE 1 TABLET BY MOUTH EVERY 8 HOURS AS NEEDED FOR NAUSEA & VOMITING   Qnasl 80 MCG/ACT Aers Generic drug: Beclomethasone Dipropionate   tamsulosin 0.4 MG Caps capsule Commonly known as: FLOMAX Take 1 capsule (0.4 mg total) by mouth daily after breakfast.   VITAMIN C ADULT GUMMIES PO Take 2 each by mouth daily.   Vitamin D (Ergocalciferol) 1.25 MG (50000 UNIT) Caps capsule Commonly known as: DRISDOL TAKE 1 CAPSULE BY MOUTH EVERY 7 DAYS   vitamin E 180 MG (400 UNITS) capsule Take 1,000 Units by mouth daily.   ZINC PO Take 1 tablet by mouth daily.  Past Medical History:  Diagnosis Date   Allergy    Arthritis    HANDS,HIPS,KNEES   Asthma    Bronchitis, chronic/intermittent 01/22/2012   Depression 01/22/2012   GERD (gastroesophageal reflux disease)    Hyperlipidemia    IBS (irritable bowel syndrome) 01/22/2012   Lupus    Neuromuscular disorder (HCC)    Osteoporosis    Seizures (HCC)    SOB (shortness of breath)    Thyroid disease     Past Surgical History:  Procedure Laterality Date   23 HOUR PH STUDY N/A 10/29/2015   Procedure: 24 HOUR PH STUDY;  Surgeon: Iva Boop, MD;  Location: [facility];  Service: Endoscopy;  Laterality: N/A;   COLONOSCOPY     ESOPHAGEAL MANOMETRY N/A 10/29/2015   Procedure: ESOPHAGEAL MANOMETRY (EM);  Surgeon: Iva Boop, MD;  Location: [facility];  Service: Endoscopy;  Laterality: N/A;   KNEE SURGERY  2002/2003   bil   RECTAL SURGERY  correction of prolapse   2010    Review of systems negative except as noted in HPI / PMHx or noted below:  Review of  Systems  Constitutional: Negative.   HENT: Negative.    Eyes: Negative.   Respiratory: Negative.    Cardiovascular: Negative.   Gastrointestinal: Negative.   Genitourinary: Negative.   Musculoskeletal: Negative.   Skin: Negative.   Neurological: Negative.   Endo/Heme/Allergies: Negative.   Psychiatric/Behavioral: Negative.       Objective:   Vitals:   12/15/23 1646  BP: 134/86  Pulse: 92  Resp: 16  Temp: 97.8 F (36.6 C)  SpO2: 96%   Height: 5\' 8"  (172.7 cm)  Weight: 235 lb 12.8 oz (107 kg)   Physical Exam Constitutional:      Appearance: She is not diaphoretic.  HENT:     Head: Normocephalic.     Right Ear: Tympanic membrane, ear canal and external ear normal.     Left Ear: Tympanic membrane, ear canal and external ear normal.     Nose: Nose normal. No mucosal edema or rhinorrhea.     Mouth/Throat:     Pharynx: Uvula midline. No oropharyngeal exudate.  Eyes:     Conjunctiva/sclera: Conjunctivae normal.  Neck:     Thyroid: No thyromegaly.     Trachea: Trachea normal. No tracheal tenderness or tracheal deviation.  Cardiovascular:     Rate and Rhythm: Normal rate and regular rhythm.     Heart sounds: Normal heart sounds, S1 normal and S2 normal. No murmur heard. Pulmonary:     Effort: No respiratory distress.     Breath sounds: Normal breath sounds. No stridor. No wheezing or rales.  Lymphadenopathy:     Head:     Right side of head: No tonsillar adenopathy.     Left side of head: No tonsillar adenopathy.     Cervical: No cervical adenopathy.  Skin:    Findings: No erythema or rash.     Nails: There is no clubbing.  Neurological:     Mental Status: She is alert.     Diagnostics:    Spirometry was performed and demonstrated an FEV1 of 2.59 at 76 % of predicted.  Results of blood tests obtained 03 November 2023 identifies tryptase 4.7 UG/L, C1 inhibitor function greater than 110%, C4 20 Mg/DL,  Results of blood tests obtained 26 November 2022  identifies TSH 1.380 IU/mL, T4 6.6 UG/DL.  Assessment and Plan:   1. Not well controlled moderate persistent asthma   2. Perennial  allergic rhinitis   3. LPRD (laryngopharyngeal reflux disease)   4. Vocal cord dysfunction   5. Angioedema, subsequent encounter    1.  Allergen avoidance measures  - dust mite, Alfuzosin  2.  Continue to treat and prevent inflammation:  A.  Dulera 200 - 2 inhalations 1-2 times per day w/ spacer  B.  Montelukast 10 mg -1 tablet 1 time per day C.  OTC Rhinocort  - 1-2 sprays each nostril 3-7 times per week D.  Start Tezepelumab injection every 4 weeks  3.  Treat and prevent reflux/LPR:  A.  Minimize all forms of caffeine consumption B.  Omeprazole 40 mg -1 tablet twice a day C.  Famotidine 40 mg -1 tablet in evening D.  Replace throat clearing with swallowing maneuver  4.  If needed:  A. Albuterol HFA - 2 inhalations or nebulization every 4-6 hours B. Antihistamine - cetirizine 10 mg - 1-2 tabs 1-2 times per day  C. Epi-pen  5. Follow through with sleep study  6. Revisit with rheumatologist about joint pain  7. Discontinue Myrbetriq as possible cause of swelling  8. Return to clinic in 8 weeks or earlier if problem  Sanaya is having some type of hypersensitivity reaction and it is difficult to discern the exact etiologic agent responsible for this recurrent angioedema and total body swelling and joint swelling.  The last medication she had introduced before this occurred was Myrbetriq and we will have her discontinue that agent.  I have encouraged her to follow-up with her rheumatologist who is treating her for some form of connective tissue disease with chronic systemic steroids for further evaluation regarding her joint pain.  For her asthma we will start her on tezepelumab as her asthma does not appear to be under very good control at this point in time.  She is maximally treated for her reflux on her current plan.  Will see her back in this  clinic in 8 weeks or earlier if there is a problem.  I did encourage her to also follow-up with pulmonology regarding a sleep study.  Laurette Schimke, MD Allergy / Immunology [name]

## 2023-12-15 NOTE — Patient Instructions (Addendum)
  1.  Allergen avoidance measures  - dust mite, Alfuzosin  2.  Continue to treat and prevent inflammation:  A.  Dulera 200 - 2 inhalations 1-2 times per day w/ spacer  B.  Montelukast 10 mg -1 tablet 1 time per day C.  OTC Rhinocort  - 1-2 sprays each nostril 3-7 times per week D.  Start Tezepelumab injection every 4 weeks  3.  Treat and prevent reflux/LPR:  A.  Minimize all forms of caffeine consumption B.  Omeprazole 40 mg -1 tablet twice a day C.  Famotidine 40 mg -1 tablet in evening D.  Replace throat clearing with swallowing maneuver  4.  If needed:  A. Albuterol HFA - 2 inhalations or nebulization every 4-6 hours B. Antihistamine - cetirizine 10 mg - 1-2 tabs 1-2 times per day  C. Epi-pen  5. Follow through with sleep study  6. Revisit with rheumatologist about joint pain  7. Discontinue Myrbetriq as possible cause of swelling  8. Return to clinic in 8 weeks or earlier if problem

## 2023-12-16 ENCOUNTER — Encounter: Payer: Self-pay | Admitting: Allergy and Immunology

## 2023-12-17 NOTE — Addendum Note (Signed)
Addended by: Kellie Simmering, Rakim Moone on: 12/17/2023 04:13 PM   Modules accepted: Orders

## 2023-12-18 NOTE — Addendum Note (Signed)
Addended by: Glena Norfolk on: 12/18/2023 11:42 AM   Modules accepted: Orders

## 2023-12-21 ENCOUNTER — Ambulatory Visit (INDEPENDENT_AMBULATORY_CARE_PROVIDER_SITE_OTHER): Payer: 59 | Admitting: Nurse Practitioner

## 2023-12-21 ENCOUNTER — Encounter: Payer: Self-pay | Admitting: Nurse Practitioner

## 2023-12-21 VITALS — BP 138/91 | HR 82 | Temp 98.0°F | Ht 68.0 in | Wt 238.0 lb

## 2023-12-21 DIAGNOSIS — M329 Systemic lupus erythematosus, unspecified: Secondary | ICD-10-CM

## 2023-12-21 DIAGNOSIS — Z6836 Body mass index (BMI) 36.0-36.9, adult: Secondary | ICD-10-CM

## 2023-12-21 DIAGNOSIS — D8989 Other specified disorders involving the immune mechanism, not elsewhere classified: Secondary | ICD-10-CM

## 2023-12-21 DIAGNOSIS — E559 Vitamin D deficiency, unspecified: Secondary | ICD-10-CM

## 2023-12-21 DIAGNOSIS — K219 Gastro-esophageal reflux disease without esophagitis: Secondary | ICD-10-CM

## 2023-12-21 DIAGNOSIS — R350 Frequency of micturition: Secondary | ICD-10-CM

## 2023-12-21 DIAGNOSIS — I479 Paroxysmal tachycardia, unspecified: Secondary | ICD-10-CM | POA: Diagnosis not present

## 2023-12-21 DIAGNOSIS — K581 Irritable bowel syndrome with constipation: Secondary | ICD-10-CM

## 2023-12-21 DIAGNOSIS — R1319 Other dysphagia: Secondary | ICD-10-CM

## 2023-12-21 DIAGNOSIS — R569 Unspecified convulsions: Secondary | ICD-10-CM

## 2023-12-21 DIAGNOSIS — E782 Mixed hyperlipidemia: Secondary | ICD-10-CM

## 2023-12-21 DIAGNOSIS — M797 Fibromyalgia: Secondary | ICD-10-CM | POA: Diagnosis not present

## 2023-12-21 DIAGNOSIS — F3342 Major depressive disorder, recurrent, in full remission: Secondary | ICD-10-CM

## 2023-12-21 DIAGNOSIS — R7309 Other abnormal glucose: Secondary | ICD-10-CM | POA: Diagnosis not present

## 2023-12-21 DIAGNOSIS — R600 Localized edema: Secondary | ICD-10-CM

## 2023-12-21 LAB — LIPID PANEL

## 2023-12-21 LAB — BAYER DCA HB A1C WAIVED: HB A1C (BAYER DCA - WAIVED): 5.6 % (ref 4.8–5.6)

## 2023-12-21 MED ORDER — GABAPENTIN 600 MG PO TABS: ORAL_TABLET | ORAL | 1 refills | Status: DC

## 2023-12-21 MED ORDER — TAMSULOSIN HCL 0.4 MG PO CAPS: 0.4000 mg | ORAL_CAPSULE | Freq: Every day | ORAL | 1 refills | Status: DC

## 2023-12-21 MED ORDER — ACETAZOLAMIDE 250 MG PO TABS: ORAL_TABLET | ORAL | 1 refills | Status: DC

## 2023-12-21 MED ORDER — OMEPRAZOLE 40 MG PO CPDR: 40.0000 mg | DELAYED_RELEASE_CAPSULE | Freq: Two times a day (BID) | ORAL | 5 refills | Status: DC

## 2023-12-21 MED ORDER — METOPROLOL SUCCINATE ER 25 MG PO TB24: 25.0000 mg | ORAL_TABLET | Freq: Every day | ORAL | 1 refills | Status: DC

## 2023-12-21 MED ORDER — ATORVASTATIN CALCIUM 40 MG PO TABS: 40.0000 mg | ORAL_TABLET | Freq: Every day | ORAL | 1 refills | Status: DC

## 2023-12-21 MED ORDER — SEMAGLUTIDE-WEIGHT MANAGEMENT 0.25 MG/0.5ML ~~LOC~~ SOAJ: 0.2500 mg | SUBCUTANEOUS | 2 refills | Status: AC

## 2023-12-21 MED ORDER — FUROSEMIDE 20 MG PO TABS: 20.0000 mg | ORAL_TABLET | Freq: Every day | ORAL | 1 refills | Status: DC

## 2023-12-21 MED ORDER — AMITRIPTYLINE HCL 75 MG PO TABS: 75.0000 mg | ORAL_TABLET | Freq: Every day | ORAL | 1 refills | Status: DC

## 2023-12-21 NOTE — Addendum Note (Signed)
Addended by: [name] on: 12/21/2023 11:14 AM   Modules accepted: Orders

## 2023-12-21 NOTE — Patient Instructions (Signed)
 Paronychia Paronychia is an infection of the skin that surrounds a nail. It usually affects the skin around a fingernail, but it may also occur near a toenail. It often causes pain and swelling around the nail. In some cases, a collection of pus (abscess) can form near or under the nail.  This condition may develop suddenly, or it may develop gradually over a longer period. In most cases, paronychia is not serious, and it will clear up with treatment. What are the causes? This condition may be caused by bacteria or a fungus, such as yeast. The bacteria or fungus can enter the body through an opening in the skin, such as a cut or a hangnail, and cause an infection in your fingernail or toenail. Other causes may include: Recurrent injury to the fingernail or toenail area. Irritation of the base and sides of the nail (cuticle). Injury and irritation can result in inflammation, swelling, and thickened skin around the nail. What increases the risk? This condition is more likely to develop in people who: Get their hands wet often, such as those who work as Fish farm manager, bartenders, or housekeepers. Bite their fingernails or cuticles. Have underlying skin conditions. Have hangnails or injured fingertips. Are exposed to irritants like detergents and other chemicals. Have diabetes. What are the signs or symptoms? Symptoms of this condition include: Redness and swelling of the skin near the nail. Tenderness around the nail when you touch the area. Pus-filled bumps under the cuticle. Fluid or pus under the nail. Throbbing pain in the area. How is this diagnosed? This condition is diagnosed with a physical exam. In some cases, a sample of pus may be tested to determine what type of bacteria or fungus is causing the condition. How is this treated? Treatment depends on the cause and severity of your condition. If your condition is mild, it may clear up on its own in a few days or after soaking in warm  water. If needed, treatment may include: Antibiotic medicine, if your infection is caused by bacteria. Antifungal medicine, if your infection is caused by a fungus. A procedure to drain pus from an abscess. Anti-inflammatory medicine (corticosteroids). Removal of part of an ingrown toenail. A bandage (dressing) may be placed over the affected area if an abscess or part of a nail has been removed. Follow these instructions at home: Wound care Keep the affected area clean. Soak the affected area in warm water if told to do so by your health care provider. You may be told to do this for 20 minutes, 2-3 times a day. Keep the area dry when you are not soaking it. Do not try to drain an abscess yourself. Follow instructions from your health care provider about how to take care of the affected area. Make sure you: Wash your hands with soap and water for at least 20 seconds before and after you change your dressing. If soap and water are not available, use hand sanitizer. Change your dressing as told by your health care provider. If you had an abscess drained, check the area every day for signs of infection. Check for: Redness, swelling, or pain. Fluid or blood. Warmth. Pus or a bad smell. Medicines  Take over-the-counter and prescription medicines only as told by your health care provider. If you were prescribed an antibiotic medicine, take it as told by your health care provider. Do not stop taking the antibiotic even if you start to feel better. General instructions Avoid contact with any skin irritants or allergens.  Do not pick at the affected area. Keep all follow-up visits as told. This is important. Prevention To prevent this condition from happening again: Wear rubber gloves when washing dishes or doing other tasks that require your hands to get wet. Wear gloves if your hands might come in contact with cleaners or other chemicals. Avoid injuring your nails or fingertips. Do not bite  your nails or tear hangnails. Do not cut your nails very short. Do not cut your cuticles. Use clean nail clippers or scissors when trimming nails. Contact a health care provider if: Your symptoms get worse or do not improve with treatment. You have continued or increased fluid, blood, or pus coming from the affected area. Your affected finger, toe, or joint becomes swollen or difficult to move. You have a fever or chills. There is redness spreading away from the affected area. Summary Paronychia is an infection of the skin that surrounds a nail. It often causes pain and swelling around the nail. In some cases, a collection of pus (abscess) can form near or under the nail. This condition may be caused by bacteria or a fungus. These germs can enter the body through an opening in the skin, such as a cut or a hangnail. If your condition is mild, it may clear up on its own in a few days. If needed, treatment may include medicine or a procedure to drain pus from an abscess. To prevent this condition from happening again, wear gloves if doing tasks that require your hands to get wet or to come in contact with chemicals. Also avoid injuring your nails or fingertips. This information is not intended to replace advice given to you by your health care provider. Make sure you discuss any questions you have with your health care provider. Document Revised: 02/04/2021 Document Reviewed: 02/04/2021 Elsevier Patient Education  2024 [employer].

## 2023-12-21 NOTE — Progress Notes (Signed)
Subjective:    Patient ID: Autumn Davis, female    DOB: [birth year], 38 y.o.   MRN: [id number]   Chief Complaint: medical management of chronic issues     HPI:  Autumn Davis is a 38 y.o. who identifies as a female who was assigned female at birth.   Social history: Lives with: parents Work history: disability   Comes in today for follow up of the following chronic medical issues:  1. Mixed hyperlipidemia Does not watch diet. Does no dedicated exercise Lab Results  Component Value Date   CHOL 168 06/02/2023   HDL 58 06/02/2023   LDLCALC 84 06/02/2023   TRIG 152 (H) 06/02/2023   CHOLHDL 2.9 06/02/2023      2. Gastroesophageal reflux disease without esophagitis Is on omeprazole and pepcid an dis doing well.  3. Esophageal dysphagia Has not had any issues as of late  4. Irritable bowel syndrome with constipation Takes baclofen and elavil  that works well for her  5. Tachycardia, paroxysmal (HCC) Is on metoprolol which keeps heart rate down  6. Recurrent major depressive disorder, in full remission (HCC) Currently on no anti depressants    09/11/2023   10:34 AM 06/02/2023    3:43 PM 05/05/2023    9:49 AM  Depression screen PHQ 2/9  Decreased Interest 0 0 0  Down, Depressed, Hopeless 0 0 0  PHQ - 2 Score 0 0 0  Altered sleeping  3   Tired, decreased energy  1   Change in appetite  1   Feeling bad or failure about yourself   0   Trouble concentrating  0   Moving slowly or fidgety/restless  0   Suicidal thoughts  0   PHQ-9 Score  5   Difficult doing work/chores  Somewhat difficult     7. Fibromyalgia 8. Inflammatory autoimmune disorder (HCC) 9. Systemic lupus erythematosus, unspecified SLE type, unspecified organ involvement status (HCC) Not really sure exactly what auto  immune disorder she has. Her test are inconclusive. She has lots of pain in which she takes neurontin for and see pain management. She has good days and bad days. She has  had multiple flare ups in the last several months which has caused her to need steroid dose pak. Without meds she could not get out of bed.  10. Seizures (HCC) Is on diamox. Has no seizure activity to report  11. Urinary frequency Has occasional frequency. Is on flomax and myrbetriq- sees urology yearly  12. Vitamin D deficiency Is on daily vitamin d supplement  13. BMI 35.0-35.9,adult No recent weight changes  Wt Readings from Last 3 Encounters:  12/21/23 238 lb (108 kg)  12/15/23 235 lb 12.8 oz (107 kg)  11/24/23 230 lb (104.3 kg)   BMI Readings from Last 3 Encounters:  12/21/23 36.19 kg/m  12/15/23 35.85 kg/m  11/24/23 34.97 kg/m      New complaints: None  today  Allergies  Allergen Reactions   Alfuzosin     Possible allergic reaction in 2024; does fine with Flomax (Tamsulosin)   Ciprofloxacin Other (See Comments)    Muscle weakness and numbness  Other reaction(s): Not available  Muscle weakness and numbness Other reaction(s): Not available  Other Reaction(s): Other (See Comments)  Muscle weakness and numbness Other reaction(s): Not available    Muscle weakness and numbness  Other reaction(s): Not available  Muscle weakness and numbness Other reaction(s): Not available   Compazine Other (See Comments)  hallucinations   Prochlorperazine Other (See Comments) and Rash    Pt states it makes her feel like things are crawling on her Other reaction(s): Not available   Outpatient Encounter Medications as of 12/21/2023  Medication Sig   acetaZOLAMIDE (DIAMOX) 250 MG tablet TAKE 1 TABLET EVERY MORNING, TAKE 1 TABLET AT 2PM, AND TAKE 2 TABLETSAT BEDTIME   albuterol (VENTOLIN HFA) 108 (90 Base) MCG/ACT inhaler Inhale 2 puffs into the lungs every 4 (four) hours as needed for wheezing or shortness of breath.   amitriptyline (ELAVIL) 75 MG tablet Take 1 tablet (75 mg total) by mouth at bedtime.   Ascorbic Acid (VITAMIN C ADULT GUMMIES PO) Take 2 each by mouth daily.    atorvastatin (LIPITOR) 40 MG tablet TAKE ONE (1) TABLET BY MOUTH EVERY DAY   baclofen (LIORESAL) 20 MG tablet TAKE ONE (1) TABLET BY MOUTH FOUR (4) TIMES DAILY   Beclomethasone Dipropionate (QNASL) 80 MCG/ACT AERS    Calcium-Magnesium-Vitamin D (CALCIUM 1200+D3 PO) Take 1 tablet by mouth daily.   cetirizine (ZYRTEC) 10 MG tablet Take 1 tablet (10 mg total) by mouth 2 (two) times daily as needed for allergies (Can take an extra dose during flare ups.).   cromolyn (OPTICROM) 4 % ophthalmic solution SMARTSIG:In Eye(s)   cyanocobalamin (VITAMIN B12) 1000 MCG/ML injection INJECT IM EVERY 30 DAYS   cycloSPORINE (RESTASIS) 0.05 % ophthalmic emulsion Place 1 drop into both eyes 2 (two) times daily.   diclofenac (VOLTAREN) 75 MG EC tablet TAKE ONE TABLET BY MOUTH TWICE DAILY   docusate sodium (COLACE) 100 MG capsule Take 100 mg by mouth 2 (two) times daily. PRN   EPINEPHRINE 0.3 mg/0.3 mL IJ SOAJ injection INJECT 0.3MG  INTO THE MUSCLE AS NEEDED FOR ANAPHYLAXIS   famotidine (PEPCID) 40 MG tablet Take 1 tablet (40 mg total) by mouth every evening.   furosemide (LASIX) 20 MG tablet Take 1 tablet (20 mg total) by mouth daily.   gabapentin (NEURONTIN) 600 MG tablet TAKE 1 TABLET BY MOUTH 3 TIMES DAILY & TAKE 2 TABLETS AT BEDTIME   ipratropium-albuterol (DUONEB) 0.5-2.5 (3) MG/3ML SOLN Take 3 mLs by nebulization every 4 (four) hours as needed.   L-Methylfolate 15 MG TABS Take 1 tablet (15 mg total) by mouth daily.   MAGNESIUM GLYCINATE PO Take 2 tablets by mouth daily.   metoprolol succinate (TOPROL-XL) 25 MG 24 hr tablet Take 1 tablet (25 mg total) by mouth daily.   mirabegron ER (MYRBETRIQ) 25 MG TB24 tablet Take 1 tablet (25 mg total) by mouth daily.   mometasone-formoterol (DULERA) 200-5 MCG/ACT AERO 2 inhalations 1-2 times per day w/ spacer   montelukast (SINGULAIR) 10 MG tablet Take 1 tablet (10 mg total) by mouth at bedtime.   Multiple Vitamins-Minerals (ZINC PO) Take 1 tablet by mouth daily.    mupirocin ointment (BACTROBAN) 2 % APPLY A SMALL AMOUNT TO THE AFFECTED AREA BY TOPICAL ROUTE 3 TIMES PER DAY   norethindrone (MICRONOR) 0.35 MG tablet Take 1 tablet by mouth daily.   nystatin (MYCOSTATIN) 100000 UNIT/ML suspension Use as directed 5 mLs (500,000 Units total) in the mouth or throat 4 (four) times daily.   nystatin cream (MYCOSTATIN) Apply 1 application topically 2 (two) times daily.   nystatin-triamcinolone ointment (MYCOLOG) Apply 1 application topically 2 (two) times daily.   Omega 3 1000 MG CAPS Take 1 capsule by mouth daily.   omeprazole (PRILOSEC) 40 MG capsule Take 1 capsule (40 mg total) by mouth 2 (two) times daily.  oxyCODONE-acetaminophen (PERCOCET) 7.5-325 MG tablet Take 1 tablet by mouth 5 (five) times daily as needed for moderate pain (pain score 4-6). No More Than 5 a day.   phenazopyridine (PYRIDIUM) 100 MG tablet Take 1 tablet (100 mg total) by mouth 3 (three) times daily as needed for pain.   predniSONE (STERAPRED UNI-PAK 21 TAB) 10 MG (21) TBPK tablet As directed x 6 days   Probiotic Product (PROBIOTIC-10 PO) Take by mouth.   promethazine (PHENERGAN) 12.5 MG tablet TAKE 1 TABLET BY MOUTH EVERY 8 HOURS AS NEEDED FOR NAUSEA & VOMITING   tamsulosin (FLOMAX) 0.4 MG CAPS capsule Take 1 capsule (0.4 mg total) by mouth daily after breakfast.   Vitamin D, Ergocalciferol, (DRISDOL) 1.25 MG (50000 UNIT) CAPS capsule TAKE 1 CAPSULE BY MOUTH EVERY 7 DAYS   vitamin E 180 MG (400 UNITS) capsule Take 1,000 Units by mouth daily.    No facility-administered encounter medications on file as of 12/21/2023.    Past Surgical History:  Procedure Laterality Date   64 HOUR PH STUDY N/A 10/29/2015   Procedure: 24 HOUR PH STUDY;  Surgeon: Iva Boop, MD;  Location: [facility];  Service: Endoscopy;  Laterality: N/A;   COLONOSCOPY     ESOPHAGEAL MANOMETRY N/A 10/29/2015   Procedure: ESOPHAGEAL MANOMETRY (EM);  Surgeon: Iva Boop, MD;  Location: [facility];  Service:  Endoscopy;  Laterality: N/A;   KNEE SURGERY  2002/2003   bil   RECTAL SURGERY  correction of prolapse   2010    Family History  Problem Relation Age of Onset   Thyroid disease Mother    Colon polyps Father    Lung cancer Maternal Grandmother    Cancer Paternal Grandmother        colon/pancreatiec/lymphoma   Irritable bowel syndrome Brother    AAA (abdominal aortic aneurysm) Neg Hx       Controlled substance contract: n/a     Review of Systems  Constitutional:  Negative for diaphoresis.  Eyes:  Negative for pain.  Respiratory:  Negative for shortness of breath.   Cardiovascular:  Negative for chest pain, palpitations and leg swelling.  Gastrointestinal:  Negative for abdominal pain.  Endocrine: Negative for polydipsia.  Skin:  Negative for rash.  Neurological:  Negative for dizziness, weakness and headaches.  Hematological:  Does not bruise/bleed easily.  All other systems reviewed and are negative.      Objective:   Physical Exam Vitals and nursing note reviewed.  Constitutional:      General: She is not in acute distress.    Appearance: Normal appearance. She is well-developed.  HENT:     Head: Normocephalic.     Right Ear: Tympanic membrane normal.     Left Ear: Tympanic membrane normal.     Nose: Nose normal.     Mouth/Throat:     Mouth: Mucous membranes are moist.  Eyes:     Pupils: Pupils are equal, round, and reactive to light.  Neck:     Vascular: No carotid bruit or JVD.  Cardiovascular:     Rate and Rhythm: Normal rate and regular rhythm.     Heart sounds: Normal heart sounds.  Pulmonary:     Effort: Pulmonary effort is normal. No respiratory distress.     Breath sounds: Normal breath sounds. No wheezing or rales.  Chest:     Chest wall: No tenderness.  Abdominal:     General: Bowel sounds are normal. There is no distension or abdominal bruit.  Palpations: Abdomen is soft. There is no hepatomegaly, splenomegaly, mass or pulsatile mass.      Tenderness: There is no abdominal tenderness.  Musculoskeletal:        General: Normal range of motion.     Cervical back: Normal range of motion and neck supple.     Right lower leg: Edema (1+) present.     Left lower leg: Edema (1+) present.  Lymphadenopathy:     Cervical: No cervical adenopathy.  Skin:    General: Skin is warm and dry.  Neurological:     Mental Status: She is alert and oriented to person, place, and time.     Deep Tendon Reflexes: Reflexes are normal and symmetric.  Psychiatric:        Behavior: Behavior normal.        Thought Content: Thought content normal.        Judgment: Judgment normal.    BP (!) 138/91   Pulse 82   Temp 98 F (36.7 C) (Temporal)   Ht 5\' 8"  (1.727 m)   Wt 238 lb (108 kg)   SpO2 99%   BMI 36.19 kg/m          Assessment & Plan:   Autumn Davis comes in today with chief complaint of medical management of chronic issues    Diagnosis and orders addressed:  1. Mixed hyperlipidemia Low fat diet - atorvastatin (LIPITOR) 40 MG tablet; TAKE ONE (1) TABLET BY MOUTH EVERY DAY  Dispense: 90 tablet; Refill: 1  2. Gastroesophageal reflux disease without esophagitis Avoid spicy foods Do not eat 2 hours prior to bedtime   3. Esophageal dysphagia Chew food well  4. Irritable bowel syndrome with constipation Increase fiber in diet - amitriptyline (ELAVIL) 75 MG tablet; Take 1 tablet (75 mg total) by mouth at bedtime.  Dispense: 90 tablet; Refill: 1 - baclofen (LIORESAL) 20 MG tablet; TAKE ONE TABLET FOUR TIMES DAILY  Dispense: 360 each; Refill: 1  5. Tachycardia, paroxysmal (HCC) Avoid caffeine - metoprolol succinate (TOPROL-XL) 25 MG 24 hr tablet; Take 1 tablet (25 mg total) by mouth daily.  Dispense: 90 tablet; Refill: 1  6. Recurrent major depressive disorder, in full remission [facility]) Stress management  7. Fibromyalgia Keep follow up pain management  8. Inflammatory autoimmune disorder (HCC) Keep follow up with  neurology - gabapentin (NEURONTIN) 600 MG tablet; TAKE 1 TABLET BY MOUTH 3 TIMES DAILY & TAKE 2 TABLETS AT BEDTIME  Dispense: 450 tablet; Refill: 1 - predniSONE (DELTASONE) 5 MG tablet; Take 1 tablet (5 mg total) by mouth every morning.  Dispense: 90 tablet; Refill: 1  9. Systemic lupus erythematosus, unspecified SLE type, unspecified organ involvement status (HCC) Keep follow up with specialist  10. Seizures (HCC) Report ant seizure activity - acetaZOLAMIDE (DIAMOX) 250 MG tablet; TAKE 1 TABLET EVERY MORNING, TAKE 1 TABLET AT 2PM, AND TAKE 2 TABLETSAT BEDTIME  Dispense: 360 tablet; Refill: 1  11. Urinary frequency Force fluids - tamsulosin (FLOMAX) 0.4 MG CAPS capsule; Take 1 capsule (0.4 mg total) by mouth daily after breakfast.  Dispense: 90 capsule; Refill: 1  12. Vitamin D deficiency Continue vitamin d supplement  13. BMI 33.0-33.9,adult Discussed diet and exercise for person with BMI >25 Will recheck weight in 3-6 months   14. Peripheral edema Elevate legs when sitting - furosemide (LASIX) 20 MG tablet; Take 1 tablet (20 mg total) by mouth daily.  Dispense: 90 tablet; Refill: 1   Labs pending Health Maintenance reviewed Diet and exercise  encouraged  Follow up plan: 6 months   Mary-Margaret Daphine Deutscher, FNP

## 2023-12-22 LAB — CBC WITH DIFFERENTIAL/PLATELET
Basophils Absolute: 0.2 10*3/uL (ref 0.0–0.2)
Basos: 3 %
EOS (ABSOLUTE): 0.2 10*3/uL (ref 0.0–0.4)
Eos: 3 %
Hematocrit: 35.8 % (ref 34.0–46.6)
Hemoglobin: 11.3 g/dL (ref 11.1–15.9)
Immature Grans (Abs): 0 10*3/uL (ref 0.0–0.1)
Immature Granulocytes: 0 %
Lymphocytes Absolute: 2.8 10*3/uL (ref 0.7–3.1)
Lymphs: 45 %
MCH: 27.4 pg (ref 26.6–33.0)
MCHC: 31.6 g/dL (ref 31.5–35.7)
MCV: 87 fL (ref 79–97)
Monocytes Absolute: 1 10*3/uL — ABNORMAL HIGH (ref 0.1–0.9)
Monocytes: 16 %
Neutrophils Absolute: 2 10*3/uL (ref 1.4–7.0)
Neutrophils: 33 %
Platelets: 293 10*3/uL (ref 150–450)
RBC: 4.13 x10E6/uL (ref 3.77–5.28)
RDW: 14.2 % (ref 11.7–15.4)
WBC: 6.1 10*3/uL (ref 3.4–10.8)

## 2023-12-22 LAB — LIPID PANEL
Cholesterol, Total: 190 mg/dL (ref 100–199)
HDL: 48 mg/dL (ref >39)
LDL CALC COMMENT:: 4 ratio (ref 0.0–4.4)
LDL Chol Calc (NIH): 90 mg/dL (ref 0–99)
Triglycerides: 317 mg/dL — ABNORMAL HIGH (ref 0–149)
VLDL Cholesterol Cal: 52 mg/dL — ABNORMAL HIGH (ref 5–40)

## 2023-12-22 LAB — CMP14+EGFR
ALT: 31 IU/L (ref 0–32)
AST: 25 IU/L (ref 0–40)
Albumin: 4.2 g/dL (ref 3.9–4.9)
Alkaline Phosphatase: 104 IU/L (ref 44–121)
BUN/Creatinine Ratio: 15 (ref 9–23)
BUN: 11 mg/dL (ref 6–20)
CO2: 22 mmol/L (ref 20–29)
Calcium: 8.9 mg/dL (ref 8.7–10.2)
Chloride: 107 mmol/L — ABNORMAL HIGH (ref 96–106)
Creatinine, Ser: 0.75 mg/dL (ref 0.57–1.00)
Globulin, Total: 2.2 g/dL (ref 1.5–4.5)
Glucose: 112 mg/dL — ABNORMAL HIGH (ref 70–99)
Potassium: 3.6 mmol/L (ref 3.5–5.2)
Sodium: 142 mmol/L (ref 134–144)
Total Protein: 6.4 g/dL (ref 6.0–8.5)
eGFR: 104 mL/min/{1.73_m2} (ref >59)

## 2023-12-23 ENCOUNTER — Telehealth: Payer: Self-pay | Admitting: Pharmacist

## 2023-12-23 NOTE — Telephone Encounter (Signed)
 Pharmacy Patient Advocate Encounter   Received notification from Patient Pharmacy that prior authorization for Wegovy  0.25MG /0.5ML auto-injectors is required/requested.   Insurance verification completed.   The patient is insured through [facility] .   Per test claim: PA required; PA submitted to above mentioned insurance via CoverMyMeds Key/confirmation #/EOC [id number] Status is pending

## 2023-12-24 ENCOUNTER — Telehealth: Payer: Self-pay | Admitting: *Deleted

## 2023-12-24 MED ORDER — TEZSPIRE 210 MG/1.91ML ~~LOC~~ SOAJ
210.0000 mg | SUBCUTANEOUS | 11 refills | Status: AC
  Filled 2023-12-28: qty 1.91, 28d supply, fill #0
  Filled 2024-02-25 (×2): qty 1.91, 28d supply, fill #1
  Filled 2024-03-21 – 2024-05-06 (×4): qty 1.91, 28d supply, fill #2
  Filled 2024-06-01 – 2024-06-06 (×2): qty 1.91, 28d supply, fill #3
  Filled 2024-06-29 – 2024-07-04 (×3): qty 1.91, 28d supply, fill #4
  Filled 2024-07-27: qty 1.91, 28d supply, fill #5
  Filled 2024-08-22: qty 1.91, 28d supply, fill #6
  Filled 2024-09-15: qty 1.91, 28d supply, fill #7
  Filled 2024-10-18: qty 1.91, 28d supply, fill #8
  Filled 2024-11-08 – 2024-11-11 (×2): qty 1.91, 28d supply, fill #9
  Filled 2024-12-06: qty 1.91, 28d supply, fill #10

## 2023-12-24 NOTE — Telephone Encounter (Signed)
-----   Message from ERIC J KOZLOW sent at 12/16/2023  6:49 AM EST ----- teze

## 2023-12-24 NOTE — Telephone Encounter (Signed)
 Called patient and advised approval and submit to [facility] for Tezspire  and will reach out once delivery is set to come in for initial dose then can self admin at home after

## 2023-12-25 ENCOUNTER — Other Ambulatory Visit (HOSPITAL_COMMUNITY): Payer: Self-pay

## 2023-12-25 NOTE — Telephone Encounter (Signed)
 Pharmacy Patient Advocate Encounter  Received notification from [employer] that Prior Authorization for Wegovy  0.25MG /0.5ML auto-injectors has been DENIED.  See denial reason below. No denial letter attached in CMM. Will attach denial letter to Media tab once received.   PA #/Case ID/Reference #: [id number]   Decision Notes: Your requested medication is an anti-obesity medication that is excluded from Part D prescription coverage under Medicare rules, unless it is being used for a medically-accepted diagnosis approved by the Food and Drug Administration. Please refer to your Evidence of Coverage (EOC) section that references Part D drug coverage in your pharmacy plan documents for more information. Reviewed by: Jolaine *Please note: Wegovy  may be covered when used for a medically-accepted indication. The Food and Drug Administration has approved Wegovy  for the treatment to reduce the risk of major adverse cardiovascular events (cardiovascular death, non-fatal myocardial infarction, or non-fatal stroke). Any request outside this indication is an exclusion.

## 2023-12-25 NOTE — Telephone Encounter (Signed)
Patient notified of denial.

## 2023-12-28 ENCOUNTER — Other Ambulatory Visit: Payer: Self-pay

## 2023-12-28 ENCOUNTER — Other Ambulatory Visit (HOSPITAL_COMMUNITY): Payer: Self-pay

## 2023-12-28 NOTE — Progress Notes (Signed)
 Specialty Pharmacy Initial Fill Coordination Note  Autumn Davis is a 38 y.o. female contacted today regarding initial fill of specialty medication(s) Tezepelumab -ekko (Tezspire )   Patient requested Courier to Provider Office   Delivery date: 12/30/23   Verified address: [facility] [address]   Medication will be filled on 12/29/23.   Patient is aware of $0.00 copayment.

## 2023-12-28 NOTE — Progress Notes (Signed)
 Specialty Pharmacy Initiation Note   Autumn Davis is a 38 y.o. female who will be followed by the specialty pharmacy service for RxSp Asthma/COPD    Review of administration, indication, effectiveness, safety, potential side effects, storage/disposable, and missed dose instructions occurred today for patient's specialty medication(s) Tezepelumab -ekko (Tezspire )     Patient/Caregiver did not have any additional questions or concerns.   Patient's therapy is appropriate to: Initiate    Goals Addressed             This Visit's Progress    Minimize recurrence of flares       Patient is initiating therapy. Patient will be evaluated at upcoming provider appointment to assess progress         Tollie Fought Specialty Pharmacist

## 2023-12-31 ENCOUNTER — Telehealth: Payer: Self-pay

## 2023-12-31 ENCOUNTER — Other Ambulatory Visit: Payer: Self-pay | Admitting: Urology

## 2023-12-31 DIAGNOSIS — R682 Dry mouth, unspecified: Secondary | ICD-10-CM

## 2023-12-31 DIAGNOSIS — R3915 Urgency of urination: Secondary | ICD-10-CM

## 2023-12-31 DIAGNOSIS — R351 Nocturia: Secondary | ICD-10-CM

## 2023-12-31 DIAGNOSIS — K581 Irritable bowel syndrome with constipation: Secondary | ICD-10-CM

## 2023-12-31 DIAGNOSIS — R350 Frequency of micturition: Secondary | ICD-10-CM

## 2023-12-31 MED ORDER — MIRABEGRON ER 25 MG PO TB24: 25.0000 mg | ORAL_TABLET | Freq: Every day | ORAL | 11 refills | Status: DC

## 2023-12-31 NOTE — Telephone Encounter (Signed)
-----   Message from [name] sent at 12/23/2023  8:52 AM EST ----- Please appeal Myrbetriq denial. Not a safe candidate for anticholinergic medications due to risk for exacerbating her constipation, dry eyes, dry mouth, and due to history of seizures. ----- Message ----- From: Troy Sine, CMA Sent: 12/21/2023   3:12 PM EST To: [name], FNP  Please review and advised

## 2023-12-31 NOTE — Telephone Encounter (Addendum)
Open in error

## 2023-12-31 NOTE — Telephone Encounter (Signed)
Medication prior authorization request received.  Completed PA request through cover my meds for drug Mirabegron 25 mg. KEYCory Munch- PA [id number]  Approved: Pending

## 2024-01-01 ENCOUNTER — Other Ambulatory Visit: Payer: Self-pay | Admitting: Nurse Practitioner

## 2024-01-01 ENCOUNTER — Telehealth: Payer: Self-pay

## 2024-01-01 NOTE — Telephone Encounter (Signed)
Patient made aware via voiced message "Sent in new rx specifying "Insurance wants brand name medication".

## 2024-01-01 NOTE — Telephone Encounter (Signed)
-----   Message from [name] sent at 12/31/2023  5:19 PM EST ----- Sent in new rx specifying "Insurance wants brand name medication". ----- Message ----- From: Troy Sine, CMA Sent: 12/31/2023   5:07 PM EST To: [name], FNP  Send in Mybetriq 25 MG

## 2024-01-08 DIAGNOSIS — R1904 Left lower quadrant abdominal swelling, mass and lump: Secondary | ICD-10-CM | POA: Diagnosis not present

## 2024-01-14 ENCOUNTER — Other Ambulatory Visit: Payer: Self-pay

## 2024-01-15 ENCOUNTER — Other Ambulatory Visit: Payer: Self-pay | Admitting: Nurse Practitioner

## 2024-01-15 DIAGNOSIS — M810 Age-related osteoporosis without current pathological fracture: Secondary | ICD-10-CM | POA: Diagnosis not present

## 2024-01-18 ENCOUNTER — Ambulatory Visit: Payer: 59 | Admitting: Urology

## 2024-01-19 ENCOUNTER — Ambulatory Visit: Payer: 59 | Admitting: Registered Nurse

## 2024-01-22 ENCOUNTER — Encounter: Payer: 59 | Attending: Registered Nurse | Admitting: Registered Nurse

## 2024-01-22 ENCOUNTER — Ambulatory Visit
Admission: RE | Admit: 2024-01-22 | Discharge: 2024-01-22 | Disposition: A | Discharge status: Home / Self Care | Payer: 59 | Source: Ambulatory Visit | Attending: Nurse Practitioner | Admitting: Nurse Practitioner

## 2024-01-22 VITALS — BP 126/87 | HR 88 | Ht 68.0 in | Wt 235.0 lb

## 2024-01-22 DIAGNOSIS — M546 Pain in thoracic spine: Secondary | ICD-10-CM | POA: Diagnosis not present

## 2024-01-22 DIAGNOSIS — R1310 Dysphagia, unspecified: Secondary | ICD-10-CM | POA: Diagnosis not present

## 2024-01-22 DIAGNOSIS — Z79899 Other long term (current) drug therapy: Secondary | ICD-10-CM | POA: Diagnosis not present

## 2024-01-22 DIAGNOSIS — Y92009 Unspecified place in unspecified non-institutional (private) residence as the place of occurrence of the external cause: Secondary | ICD-10-CM | POA: Insufficient documentation

## 2024-01-22 DIAGNOSIS — M62838 Other muscle spasm: Secondary | ICD-10-CM | POA: Diagnosis not present

## 2024-01-22 DIAGNOSIS — M545 Low back pain, unspecified: Secondary | ICD-10-CM

## 2024-01-22 DIAGNOSIS — M2550 Pain in unspecified joint: Secondary | ICD-10-CM

## 2024-01-22 DIAGNOSIS — Z79891 Long term (current) use of opiate analgesic: Secondary | ICD-10-CM | POA: Diagnosis not present

## 2024-01-22 DIAGNOSIS — F418 Other specified anxiety disorders: Secondary | ICD-10-CM | POA: Diagnosis not present

## 2024-01-22 DIAGNOSIS — G40909 Epilepsy, unspecified, not intractable, without status epilepticus: Secondary | ICD-10-CM | POA: Insufficient documentation

## 2024-01-22 DIAGNOSIS — M797 Fibromyalgia: Secondary | ICD-10-CM

## 2024-01-22 DIAGNOSIS — W19XXXD Unspecified fall, subsequent encounter: Secondary | ICD-10-CM

## 2024-01-22 DIAGNOSIS — M25561 Pain in right knee: Secondary | ICD-10-CM

## 2024-01-22 DIAGNOSIS — G8929 Other chronic pain: Secondary | ICD-10-CM | POA: Diagnosis not present

## 2024-01-22 DIAGNOSIS — R0789 Other chest pain: Secondary | ICD-10-CM | POA: Diagnosis not present

## 2024-01-22 DIAGNOSIS — G609 Hereditary and idiopathic neuropathy, unspecified: Secondary | ICD-10-CM

## 2024-01-22 DIAGNOSIS — Z5181 Encounter for therapeutic drug level monitoring: Secondary | ICD-10-CM | POA: Diagnosis not present

## 2024-01-22 DIAGNOSIS — G629 Polyneuropathy, unspecified: Secondary | ICD-10-CM | POA: Insufficient documentation

## 2024-01-22 DIAGNOSIS — M25562 Pain in left knee: Secondary | ICD-10-CM | POA: Insufficient documentation

## 2024-01-22 DIAGNOSIS — G894 Chronic pain syndrome: Secondary | ICD-10-CM | POA: Diagnosis not present

## 2024-01-22 DIAGNOSIS — Z1231 Encounter for screening mammogram for malignant neoplasm of breast: Secondary | ICD-10-CM | POA: Diagnosis not present

## 2024-01-22 MED ORDER — OXYCODONE-ACETAMINOPHEN 7.5-325 MG PO TABS: 1.0000 | ORAL_TABLET | Freq: Every day | ORAL | 0 refills | Status: DC | PRN

## 2024-01-22 NOTE — Progress Notes (Signed)
 Subjective:    Patient ID: Autumn Davis, female    DOB: Apr 16, 1986, 38 y.o.   MRN: [id number]  HPI: Autumn Davis is a 38 y.o. female who returns for follow up appointment for chronic pain and medication refill. She states her pain is located in her mid- lower back, bilateral lower extremities and bilateral feet with tingling. She also reports bilateral knee pain. She rates her pain 7. Her current exercise regime is walking and performing stretching exercises.  Autumn Davis also reports a week ago she was getting up to go to the bathroom , her legs elt weak. She fell on her left side. She was able to pick herself up, she didn't seek medical attention.   Autumn Davis Morphine equivalent is 56.25 MME.   Oral Swab was Performed Today.    Pain Inventory Average Pain 7 Pain Right Now 7 My pain is constant, sharp, burning, dull, and tingling  In the last 24 hours, has pain interfered with the following? General activity 7 Relation with others 4 Enjoyment of life 4 What TIME of day is your pain at its worst? night Sleep (in general) Poor  Pain is worse with: sitting and some activites Pain improves with: rest, heat/ice, and medication Relief from Meds: 8  Family History  Problem Relation Age of Onset   Thyroid disease Mother    Colon polyps Father    Lung cancer Maternal Grandmother    Cancer Paternal Grandmother        colon/pancreatiec/lymphoma   Irritable bowel syndrome Brother    AAA (abdominal aortic aneurysm) Neg Hx    Social History   Socioeconomic History   Marital status: Legally Separated    Spouse name: Not on file   Number of children: 1   Years of education: Not on file   Highest education level: Not on file  Occupational History   Occupation: disabled    Employer: [employer]  Tobacco Use   Smoking status: Never   Smokeless tobacco: Never  Vaping Use   Vaping status: Never Used  Substance and Sexual Activity   Alcohol use: No     Alcohol/week: 0.0 standard drinks of alcohol   Drug use: No   Sexual activity: Not Currently  Other Topics Concern   Not on file  Social History Narrative   She lives alone with her 71 year old son - right now - they are living with her parents right now   Social Drivers of Corporate investment banker Strain: Low Risk  (05/05/2023)   Overall Financial Resource Strain (CARDIA)    Difficulty of Paying Living Expenses: Not hard at all  Food Insecurity: No Food Insecurity (05/05/2023)   Hunger Vital Sign    Worried About Running Out of Food in the Last Year: Never true    Ran Out of Food in the Last Year: Never true  Transportation Needs: No Transportation Needs (05/05/2023)   PRAPARE - Administrator, Civil Service (Medical): No    Lack of Transportation (Non-Medical): No  Physical Activity: Insufficiently Active (05/05/2023)   Exercise Vital Sign    Days of Exercise per Week: 2 days    Minutes of Exercise per Session: 30 min  Stress: No Stress Concern Present (05/05/2023)   [employer] of Occupational Health - Occupational Stress Questionnaire    Feeling of Stress : Not at all  Social Connections: Moderately Isolated (05/05/2023)   Social Connection and Isolation Panel [NHANES]  Frequency of Communication with Friends and Family: More than three times a week    Frequency of Social Gatherings with Friends and Family: More than three times a week    Attends Religious Services: More than 4 times per year    Active Member of Clubs or Organizations: No    Attends Banker Meetings: Never    Marital Status: Separated   Past Surgical History:  Procedure Laterality Date   24 HOUR PH STUDY N/A 10/29/2015   Procedure: 24 HOUR PH STUDY;  Surgeon: Iva Boop, MD;  Location: [facility];  Service: Endoscopy;  Laterality: N/A;   COLONOSCOPY     ESOPHAGEAL MANOMETRY N/A 10/29/2015   Procedure: ESOPHAGEAL MANOMETRY (EM);  Surgeon: Iva Boop, MD;   Location: [facility];  Service: Endoscopy;  Laterality: N/A;   KNEE SURGERY  2002/2003   bil   RECTAL SURGERY  correction of prolapse   2010   Past Surgical History:  Procedure Laterality Date   41 HOUR PH STUDY N/A 10/29/2015   Procedure: 24 HOUR PH STUDY;  Surgeon: Iva Boop, MD;  Location: [facility];  Service: Endoscopy;  Laterality: N/A;   COLONOSCOPY     ESOPHAGEAL MANOMETRY N/A 10/29/2015   Procedure: ESOPHAGEAL MANOMETRY (EM);  Surgeon: Iva Boop, MD;  Location: [facility];  Service: Endoscopy;  Laterality: N/A;   KNEE SURGERY  2002/2003   bil   RECTAL SURGERY  correction of prolapse   2010   Past Medical History:  Diagnosis Date   Allergy    Arthritis    HANDS,HIPS,KNEES   Asthma    Bronchitis, chronic/intermittent 01/22/2012   Depression 01/22/2012   GERD (gastroesophageal reflux disease)    Hyperlipidemia    IBS (irritable bowel syndrome) 01/22/2012   Lupus    Neuromuscular disorder (HCC)    Osteoporosis    Seizures (HCC)    SOB (shortness of breath)    Thyroid disease    BP 126/87   Pulse 88   Ht 5\' 8"  (1.727 m)   Wt 235 lb (106.6 kg)   SpO2 99%   BMI 35.73 kg/m   Opioid Risk Score:   Fall Risk Score:  `1  Depression screen [facility] 2/9     12/21/2023   10:21 AM 09/11/2023   10:34 AM 06/02/2023    3:43 PM 05/05/2023    9:49 AM 12/08/2022   11:19 AM 11/26/2022    4:14 PM 07/28/2022   11:31 AM  Depression screen PHQ 2/9  Decreased Interest 2 0 0 0 0 1 0  Down, Depressed, Hopeless 0 0 0 0 0 0 0  PHQ - 2 Score 2 0 0 0 0 1 0  Altered sleeping 2  3   1    Tired, decreased energy 2  1   1    Change in appetite 0  1   1   Feeling bad or failure about yourself  0  0   0   Trouble concentrating 0  0   0   Moving slowly or fidgety/restless 0  0   0   Suicidal thoughts 0  0   0   PHQ-9 Score 6  5   4    Difficult doing work/chores Somewhat difficult  Somewhat difficult   Somewhat difficult       Review of Systems  Musculoskeletal:  Positive  for back pain.       Bilateral hand, foot and ankle pain  All other systems reviewed and  are negative.      Objective:   Physical Exam Vitals and nursing note reviewed.  Constitutional:      Appearance: Normal appearance. She is obese.  Cardiovascular:     Rate and Rhythm: Normal rate and regular rhythm.     Pulses: Normal pulses.     Heart sounds: Normal heart sounds.  Pulmonary:     Effort: Pulmonary effort is normal.     Breath sounds: Normal breath sounds.  Musculoskeletal:     Comments: Normal Muscle Bulk and Muscle Testing Reveals:  Upper Extremities: Full ROM and Muscle Strength 5/5 Thoracic Paraspinal Tenderness: T-7-T-10  Lumbar Paraspinal Tenderness: L-4-L-5 Bilateral Greater Trochanter Tenderness Lower Extremities: Full ROM and Muscle Strength 5/5 Bilateral Lower Extremities Flexion Produces Pain into her Bilateral Patella's Arises from chair slowly Narrow Based  Gait     Skin:    General: Skin is warm and dry.  Neurological:     Mental Status: She is alert and oriented to person, place, and time.  Psychiatric:        Mood and Affect: Mood normal.        Behavior: Behavior normal.          Assessment & Plan:  1. Chronic seizure disorder: No seizure's. Continue current medication regimen with  Gabapentin. Neurology Following. 01/22/2024. 2. Chronic muscle spasms, weakness with associated pain disorder: Continue current medication regimen with Baclofen. Continue with Exercise regime. 01/22/2024. 3. Chronic dysphagia: No complaints today. Continue to monitor. GI Following. 11/24/2023. 4. Anxiety with depression : No complaints today. Stable. Continue current medication regimen with  Elavil. 01/22/2024. 5. Fibromyalgia/ Rib Pain/Chronic Pain: Continue Diclofenac: Continue with exercise and heat Therapy. 01/22/2024. Refilled:  oxyCODONE 7.5/325mg  one tablet 5 times  daily as needed  #140. Second script sent for the following month. Continue with slow weaning.    We will continue the opioid monitoring program, this consists of regular clinic visits, examinations, urine drug screen, pill counts as well as use of [place] Controlled Substance Reporting system. A 12 month History has been reviewed on the [place] Controlled Substance Reporting System 01/22/2024.  6. Peripheral Neuropathy: Continue current medication regimen with  Gabapentin: 01/22/2024. 7. Polyarthralgia: Continue HEP as Tolerated. Continue to Monitor.  01/22/2024  8. Bilateral Chronic Knee Pain: Continue HEP as Tolerated. Continue to Monitor.  9. Fall in home, subsequent encounter: Educated on alls prevention. She verbalizes understanding.   F/U in 2 months

## 2024-01-27 LAB — DRUG TOX MONITOR 1 W/CONF, ORAL FLD
Amphetamines: NEGATIVE ng/mL (ref <10)
Barbiturates: NEGATIVE ng/mL (ref <10)
Benzodiazepines: NEGATIVE ng/mL (ref <0.50)
Buprenorphine: NEGATIVE ng/mL (ref <0.10)
Cocaine: NEGATIVE ng/mL (ref <5.0)
Codeine: NEGATIVE ng/mL (ref <2.5)
Dihydrocodeine: NEGATIVE ng/mL (ref <2.5)
Fentanyl: NEGATIVE ng/mL (ref <0.10)
Heroin Metabolite: NEGATIVE ng/mL (ref <1.0)
Hydrocodone: NEGATIVE ng/mL (ref <2.5)
Hydromorphone: NEGATIVE ng/mL (ref <2.5)
MARIJUANA: NEGATIVE ng/mL (ref <2.5)
MDMA: NEGATIVE ng/mL (ref <10)
Meprobamate: NEGATIVE ng/mL (ref <2.5)
Methadone: NEGATIVE ng/mL (ref <5.0)
Morphine: NEGATIVE ng/mL (ref <2.5)
Nicotine Metabolite: NEGATIVE ng/mL (ref <5.0)
Norhydrocodone: NEGATIVE ng/mL (ref <2.5)
Noroxycodone: 18.6 ng/mL — ABNORMAL HIGH (ref <2.5)
Opiates: POSITIVE ng/mL — AB (ref <2.5)
Oxycodone: 154.7 ng/mL — ABNORMAL HIGH (ref <2.5)
Oxymorphone: NEGATIVE ng/mL (ref <2.5)
Phencyclidine: NEGATIVE ng/mL (ref <10)
Tapentadol: NEGATIVE ng/mL (ref <5.0)
Tramadol: NEGATIVE ng/mL (ref <5.0)
Zolpidem: NEGATIVE ng/mL (ref <5.0)

## 2024-01-27 LAB — DRUG TOX ALC METAB W/CON, ORAL FLD: Alcohol Metabolite: NEGATIVE ng/mL (ref <25)

## 2024-01-28 ENCOUNTER — Telehealth: Payer: Self-pay | Admitting: Registered Nurse

## 2024-01-28 DIAGNOSIS — M797 Fibromyalgia: Secondary | ICD-10-CM

## 2024-01-28 MED ORDER — OXYCODONE-ACETAMINOPHEN 7.5-325 MG PO TABS: 1.0000 | ORAL_TABLET | Freq: Every day | ORAL | 0 refills | Status: DC | PRN

## 2024-01-28 NOTE — Telephone Encounter (Signed)
 PMP was Reviewed.  Oxycodone prescription sent to pharmacy.  Autumn Davis is aware o the above via My-Chart message

## 2024-01-29 ENCOUNTER — Other Ambulatory Visit: Payer: Self-pay | Admitting: Nurse Practitioner

## 2024-01-29 DIAGNOSIS — D8989 Other specified disorders involving the immune mechanism, not elsewhere classified: Secondary | ICD-10-CM

## 2024-01-29 NOTE — Telephone Encounter (Signed)
 This is no longer on her med list-ok to refill?

## 2024-01-30 ENCOUNTER — Encounter: Payer: Self-pay | Admitting: Registered Nurse

## 2024-02-01 ENCOUNTER — Ambulatory Visit: Admitting: *Deleted

## 2024-02-01 DIAGNOSIS — J4550 Severe persistent asthma, uncomplicated: Secondary | ICD-10-CM

## 2024-02-01 MED ORDER — TEZEPELUMAB-EKKO 210 MG/1.91ML ~~LOC~~ SOSY
210.0000 mg | PREFILLED_SYRINGE | SUBCUTANEOUS | Status: AC
  Administered 2024-02-01 – 2024-02-29 (×2): 210 mg via SUBCUTANEOUS

## 2024-02-01 NOTE — Progress Notes (Signed)
 Immunotherapy   Patient Details  Name: Autumn Davis MRN: [id number] Date of Birth: [birth year]  02/01/2024  [name] started injections for  Tezspire  Frequency: Every 28 days Epi-Pen: Not Required Consent signed and patient instructions given. Patient started Tezspire today and received 1.61mL in the RUA. Patient waited 15 minutes in office and did not experience any issues.   Tekeya Geffert Fernandez-Vernon 02/01/2024, 2:25 PM

## 2024-02-04 ENCOUNTER — Other Ambulatory Visit (HOSPITAL_COMMUNITY): Payer: Self-pay

## 2024-02-05 ENCOUNTER — Other Ambulatory Visit: Payer: Self-pay

## 2024-02-16 ENCOUNTER — Ambulatory Visit: Payer: 59 | Admitting: Allergy and Immunology

## 2024-02-25 ENCOUNTER — Other Ambulatory Visit: Payer: Self-pay

## 2024-02-25 ENCOUNTER — Other Ambulatory Visit: Payer: Self-pay | Admitting: Pharmacy Technician

## 2024-02-25 NOTE — Progress Notes (Signed)
 Specialty Pharmacy Refill Coordination Note  Autumn Davis is a 38 y.o. female contacted today regarding refills of specialty medication(s) Daiva Huge [facility])   Patient requested Courier to Provider Office   Delivery date: 02/29/24   Verified address: [facility] [address]  [place] [address]   Medication will be filled on 02/26/24.

## 2024-02-27 ENCOUNTER — Other Ambulatory Visit: Payer: Self-pay | Admitting: Nurse Practitioner

## 2024-02-27 DIAGNOSIS — K581 Irritable bowel syndrome with constipation: Secondary | ICD-10-CM

## 2024-02-29 ENCOUNTER — Ambulatory Visit

## 2024-02-29 DIAGNOSIS — J4550 Severe persistent asthma, uncomplicated: Secondary | ICD-10-CM

## 2024-02-29 NOTE — Progress Notes (Signed)
 Immunotherapy   Patient Details  Name: Autumn Davis MRN: [id number] Date of Birth: Mar 14, 1986  02/29/2024  Leretha Rankin was shown how to self administer her Tezspire. Patient demonstrated correct administration. Patient will continue to self administer at home.   Frequency: every 4 weeks Consent signed and patient instructions given.   Denton Flakes 02/29/2024, 4:03 PM

## 2024-03-14 ENCOUNTER — Other Ambulatory Visit: Payer: Self-pay | Admitting: Nurse Practitioner

## 2024-03-15 ENCOUNTER — Other Ambulatory Visit: Payer: Self-pay

## 2024-03-15 ENCOUNTER — Telehealth: Payer: Self-pay | Admitting: Registered Nurse

## 2024-03-15 NOTE — Telephone Encounter (Signed)
 Pt called and stated her insurance runs out at the end of April and she wanted to know if Autumn Davis will refill her meds for may if she gets rescheduled until June bc that's when her full medicaid will be active

## 2024-03-18 ENCOUNTER — Encounter: Admitting: Registered Nurse

## 2024-03-21 ENCOUNTER — Other Ambulatory Visit: Payer: Self-pay | Admitting: Pharmacy Technician

## 2024-03-21 ENCOUNTER — Telehealth: Payer: Self-pay | Admitting: Registered Nurse

## 2024-03-21 ENCOUNTER — Other Ambulatory Visit: Payer: Self-pay

## 2024-03-21 DIAGNOSIS — M797 Fibromyalgia: Secondary | ICD-10-CM

## 2024-03-21 MED ORDER — OXYCODONE-ACETAMINOPHEN 7.5-325 MG PO TABS: 1.0000 | ORAL_TABLET | Freq: Every day | ORAL | 0 refills | Status: DC | PRN

## 2024-03-21 NOTE — Progress Notes (Signed)
 Specialty Pharmacy Refill Coordination Note  Autumn Davis is a 38 y.o. female contacted today regarding refills of specialty medication(s) Tezepelumab -ekko (TEZSPIRE )   Patient requested Delivery   Delivery date: 03/23/24   Verified address: [address] [place], [place] [address]   Medication will be filled on 03/22/24.

## 2024-03-21 NOTE — Telephone Encounter (Signed)
 PMP was Reviewed.  Oxycodone  e-scribed to pharmacy.  [name] is aware of the above via My-Chart

## 2024-03-22 ENCOUNTER — Other Ambulatory Visit (HOSPITAL_COMMUNITY): Payer: Self-pay

## 2024-03-22 ENCOUNTER — Other Ambulatory Visit: Payer: Self-pay

## 2024-03-22 NOTE — Progress Notes (Signed)
 Insurance terminated and no eligibility results. Routed to Juliana.

## 2024-03-22 NOTE — Progress Notes (Signed)
 Message sent to patient via MyChart- pending reply with new insurance.

## 2024-03-23 ENCOUNTER — Other Ambulatory Visit: Payer: Self-pay

## 2024-03-24 ENCOUNTER — Other Ambulatory Visit: Payer: Self-pay

## 2024-03-31 ENCOUNTER — Other Ambulatory Visit: Payer: Self-pay | Admitting: Nurse Practitioner

## 2024-04-07 ENCOUNTER — Ambulatory Visit: Payer: Self-pay

## 2024-04-07 NOTE — Telephone Encounter (Signed)
 Please see earlier triage encounter from today.  Patient called back to see if she could be seen today instead of tomorrow. No availability in the office today-instructed that Urgent Care or Emergency Department would be her options to be seen today. Patient states she would wait to be seen at her appointment tomorrow.   Copied from CRM [id number]. Topic: Clinical - Red Word Triage >> Apr 07, 2024 11:43 AM Brynn Caras wrote: Red Word that prompted transfer to Nurse Triage: Left foot is swollen and both legs are swelling badly patient states its causing extreme pain. >> Apr 07, 2024  2:26 PM Terence Fend E wrote: Patient calling in asking for an appt today instead of  scheduled appt date and time Reason for Disposition  General information question, no triage required and triager able to answer question  Answer Assessment - Initial Assessment Questions 1. REASON FOR CALL or QUESTION: "What is your reason for calling today?" or "How can I best help you?" or "What question do you have that I can help answer?"     Patient was triaged earlier and appointment made for tomorrow. Patient called back to see if she could be seen today instead. Explained that there are no appointments available today in the office. Patient is instructed that her other options would be urgent care or the emergency department. Patient states she would rather be seen in the office tomorrow.  Protocols used: Information Only Call - No Triage-A-AH

## 2024-04-07 NOTE — Telephone Encounter (Signed)
 Appt made for today

## 2024-04-07 NOTE — Telephone Encounter (Signed)
 Appt made for dod

## 2024-04-07 NOTE — Telephone Encounter (Signed)
 Appt made for tomorrow.

## 2024-04-07 NOTE — Telephone Encounter (Signed)
 Copied from CRM [id number]. Topic: Clinical - Red Word Triage >> Apr 07, 2024 11:43 AM Brynn Caras wrote: Red Word that prompted transfer to Nurse Triage: Left foot is swollen and both legs are swelling badly patient states its causing extreme pain.   Chief Complaint: swelling Symptoms: both legs and left foot Frequency: constant Pertinent Negatives: Patient denies shortness of breath Disposition: [] ED /[] Urgent Care (no appt availability in office) / [x] Appointment(In office/virtual)/ []  [facility]/ [] Home Care/ [] Refused Recommended Disposition /[]  Mobile Bus/ []  Follow-up with PCP Additional Notes: Bilateral leg swelling x 2 week, getting worse, now red and tender to touch. Pt states she normally has swelling in her legs after working most the day but now the pain is cncerning. Appt scheduled with DOD on 5/23  Reason for Disposition  [1] MODERATE leg swelling (e.g., swelling extends up to knees) AND [2] new-onset or worsening  Answer Assessment - Initial Assessment Questions 1. ONSET: "When did the swelling start?" (e.g., minutes, hours, days)     2 weeks  2. LOCATION: "What part of the leg is swollen?"  "Are both legs swollen or just one leg?"     Both legs and left foot  3. SEVERITY: "How bad is the swelling?" (e.g., localized; mild, moderate, severe)   - Localized: Small area of swelling localized to one leg.   - MILD pedal edema: Swelling limited to foot and ankle, pitting edema < 1/4 inch (6 mm) deep, rest and elevation eliminate most or all swelling.   - MODERATE edema: Swelling of lower leg to knee, pitting edema > 1/4 inch (6 mm) deep, rest and elevation only partially reduce swelling.   - SEVERE edema: Swelling extends above knee, facial or hand swelling present.      Moderate swelling  4. REDNESS: "Does the swelling look red or infected?"     There is some redness. Top of left foot looks red  5. PAIN: "Is the swelling painful to touch?" If Yes, ask:  "How painful is it?"   (Scale 1-10; mild, moderate or severe)     Moderate pain, unrelieved with RX pain meds, worse when touched  6. FEVER: "Do you have a fever?" If Yes, ask: "What is it, how was it measured, and when did it start?"      NO  7. CAUSE: "What do you think is causing the leg swelling?"     Unsure of cause, think its could be related to being on feet all day  8. MEDICAL HISTORY: "Do you have a history of blood clots (e.g., DVT), cancer, heart failure, kidney disease, or liver failure?"     No  9. RECURRENT SYMPTOM: "Have you had leg swelling before?" If Yes, ask: "When was the last time?" "What happened that time?"     Yes, get some swelling after working  10. OTHER SYMPTOMS: "Do you have any other symptoms?" (e.g., chest pain, difficulty breathing)       No  11. PREGNANCY: "Is there any chance you are pregnant?" "When was your last menstrual period?"       LMP-irregular. No chance of pregnancy  Protocols used: Leg Swelling and Edema-A-AH

## 2024-04-08 ENCOUNTER — Ambulatory Visit (INDEPENDENT_AMBULATORY_CARE_PROVIDER_SITE_OTHER): Admitting: Family

## 2024-04-08 ENCOUNTER — Telehealth: Payer: Self-pay | Admitting: Nurse Practitioner

## 2024-04-08 ENCOUNTER — Encounter: Payer: Self-pay | Admitting: Family

## 2024-04-08 ENCOUNTER — Ambulatory Visit (INDEPENDENT_AMBULATORY_CARE_PROVIDER_SITE_OTHER)

## 2024-04-08 VITALS — BP 138/85 | HR 82 | Temp 101.4°F | Ht 68.0 in | Wt 239.8 lb

## 2024-04-08 DIAGNOSIS — R509 Fever, unspecified: Secondary | ICD-10-CM

## 2024-04-08 DIAGNOSIS — R600 Localized edema: Secondary | ICD-10-CM

## 2024-04-08 MED ORDER — FUROSEMIDE 40 MG PO TABS: 40.0000 mg | ORAL_TABLET | Freq: Every day | ORAL | 0 refills | Status: DC

## 2024-04-08 NOTE — Telephone Encounter (Signed)
 Pt had visit with Tommas Fragmin today and per Christys notes, patient needs to see PCP for a 2 wk follow up to recheck Edema. PCP only had 1 opening available on June 3rd and patient can't come that day.   Please advise.

## 2024-04-08 NOTE — Progress Notes (Signed)
 Subjective:    Patient ID: Autumn Davis, female    DOB: 28-Nov-1985, 38 y.o.   MRN: [id number]  Chief Complaint  Patient presents with   Leg Swelling    Started about a month ago but has gotten worse in the past 2 weeks. By bedtime feet and ankles are double in size.   PT presents to the office today with bilateral leg swelling that started two weeks ago. She takes lasix  20 mg daily, wearing compression hose. Reports her swelling will improve during the day, but worsens by the end of the day.      04/08/2024   10:59 AM 01/22/2024    1:30 PM 12/21/2023   10:20 AM  Last 3 Weights  Weight (lbs) 239 lb 12.8 oz 235 lb 238 lb  Weight (kg) 108.773 kg 106.595 kg 107.956 kg      She has a fever today, but denies any cough, dysuria, new SOB, or skin infections.   Fever  This is a new problem. The current episode started today. The problem has been unchanged. The maximum temperature noted was 101 to 101.9 F. Associated symptoms include nausea and sleepiness. Pertinent negatives include no congestion, coughing, diarrhea, ear pain, headaches, muscle aches, rash, sore throat, urinary pain, vomiting or wheezing. She has tried nothing for the symptoms. The treatment provided no relief.      Review of Systems  Constitutional:  Positive for fever.  HENT:  Negative for congestion, ear pain and sore throat.   Respiratory:  Negative for cough and wheezing.   Gastrointestinal:  Positive for nausea. Negative for diarrhea and vomiting.  Genitourinary:  Negative for dysuria.  Skin:  Negative for rash.  Neurological:  Negative for headaches.  All other systems reviewed and are negative.   Social History   Socioeconomic History   Marital status: Legally Separated    Spouse name: Not on file   Number of children: 1   Years of education: Not on file   Highest education level: Not on file  Occupational History   Occupation: disabled    Employer: [employer]  Tobacco Use    Smoking status: Never   Smokeless tobacco: Never  Vaping Use   Vaping status: Never Used  Substance and Sexual Activity   Alcohol  use: No    Alcohol /week: 0.0 standard drinks of alcohol    Drug use: No   Sexual activity: Not Currently  Other Topics Concern   Not on file  Social History Narrative   She lives alone with her 29 year old son - right now - they are living with her parents right now   Social Drivers of Corporate investment banker Strain: Low Risk  (05/05/2023)   Overall Financial Resource Strain (CARDIA)    Difficulty of Paying Living Expenses: Not hard at all  Food Insecurity: No Food Insecurity (05/05/2023)   Hunger Vital Sign    Worried About Running Out of Food in the Last Year: Never true    Ran Out of Food in the Last Year: Never true  Transportation Needs: No Transportation Needs (05/05/2023)   PRAPARE - Administrator, Civil Service (Medical): No    Lack of Transportation (Non-Medical): No  Physical Activity: Insufficiently Active (05/05/2023)   Exercise Vital Sign    Days of Exercise per Week: 2 days    Minutes of Exercise per Session: 30 min  Stress: No Stress Concern Present (05/05/2023)   [employer] of Occupational Health -  Occupational Stress Questionnaire    Feeling of Stress : Not at all  Social Connections: Moderately Isolated (05/05/2023)   Social Connection and Isolation Panel [NHANES]    Frequency of Communication with Friends and Family: More than three times a week    Frequency of Social Gatherings with Friends and Family: More than three times a week    Attends Religious Services: More than 4 times per year    Active Member of [employer] or Organizations: No    Attends Banker Meetings: Never    Marital Status: Separated   Family History  Problem Relation Age of Onset   Thyroid  disease Mother    Colon polyps Father    Lung cancer Maternal Grandmother    Cancer Paternal Grandmother        colon/pancreatiec/lymphoma    Irritable bowel syndrome Brother    AAA (abdominal aortic aneurysm) Neg Hx         Objective:   Physical Exam Vitals reviewed.  Constitutional:      General: She is not in acute distress.    Appearance: She is well-developed.  HENT:     Head: Normocephalic and atraumatic.     Right Ear: Tympanic membrane normal.     Left Ear: Tympanic membrane normal.  Eyes:     Pupils: Pupils are equal, round, and reactive to light.  Neck:     Thyroid : No thyromegaly.  Cardiovascular:     Rate and Rhythm: Normal rate and regular rhythm.     Heart sounds: Normal heart sounds. No murmur heard. Pulmonary:     Effort: Pulmonary effort is normal. No respiratory distress.     Breath sounds: Normal breath sounds. No wheezing.  Abdominal:     General: Bowel sounds are normal. There is no distension.     Palpations: Abdomen is soft.     Tenderness: There is no abdominal tenderness.  Musculoskeletal:        General: No tenderness. Normal range of motion.     Cervical back: Normal range of motion and neck supple.     Right lower leg: Edema (2+) present.     Left lower leg: Edema (2+) present.  Skin:    General: Skin is warm and dry.  Neurological:     Mental Status: She is alert and oriented to person, place, and time.     Cranial Nerves: No cranial nerve deficit.     Deep Tendon Reflexes: Reflexes are normal and symmetric.  Psychiatric:        Behavior: Behavior normal.        Thought Content: Thought content normal.        Judgment: Judgment normal.       BP 138/85   Pulse 82   Temp (!) 101.4 F (38.6 C)   Ht 5\' 8"  (1.727 m)   Wt 239 lb 12.8 oz (108.8 kg)   SpO2 94%   BMI 36.46 kg/m      Assessment & Plan:  Autumn Davis comes in today with chief complaint of Leg Swelling (Started about a month ago but has gotten worse in the past 2 weeks. By bedtime feet and ankles are double in size.)   Diagnosis and orders addressed:  1. Fever, unspecified fever cause  (Primary) No s/s of infection on exam. Could be viral?  Report any dysuria, cough, new SOB, congestion, or skin breakdown - CBC with Differential/Platelet - DG Chest 2 View; Future  2. Peripheral edema Ace wraps  placed today PT will wear compression hose at home with moderate compression Low salt diet  Will increase Lasix  to 40 mg for next 3-5 days, then go back to Lasix  20 mg  Daily weights  Follow up with PCP in 2 weeks  - CMP14+EGFR - CBC with Differential/Platelet - Brain natriuretic peptide - DG Chest 2 View; Future - furosemide  (LASIX ) 40 MG tablet; Take 1 tablet (40 mg total) by mouth daily.  Dispense: 5 tablet; Refill: 0     Tommas Fragmin, FNP

## 2024-04-08 NOTE — Patient Instructions (Signed)
 Peripheral Edema  Peripheral edema is swelling that is caused by a buildup of fluid. Peripheral edema most often affects the lower legs, ankles, and feet. It can also develop in the arms, hands, and face. The area of the body that has peripheral edema will look swollen. It may also feel heavy or warm. Your clothes may start to feel tight. Pressing on the area may make a temporary dent in your skin (pitting edema). You may not be able to move your swollen arm or leg as much as usual. There are many causes of peripheral edema. It can happen because of a complication of other conditions such as heart failure, kidney disease, or a problem with your circulation. It also can be a side effect of certain medicines or happen because of an infection. It often happens to women during pregnancy. Sometimes, the cause is not known. Follow these instructions at home: Managing pain, stiffness, and swelling  Raise (elevate) your legs while you are sitting or lying down. Move around often to prevent stiffness and to reduce swelling. Do not sit or stand for long periods of time. Do not wear tight clothing. Do not wear garters on your upper legs. Exercise your legs to get your circulation going. This helps to move the fluid back into your blood vessels, and it may help the swelling go down. Wear compression stockings as told by your health care provider. These stockings help to prevent blood clots and reduce swelling in your legs. It is important that these are the correct size. These stockings should be prescribed by your doctor to prevent possible injuries. If elastic bandages or wraps are recommended, use them as told by your health care provider. Medicines Take over-the-counter and prescription medicines only as told by your health care provider. Your health care provider may prescribe medicine to help your body get rid of excess water (diuretic). Take this medicine if you are told to take it. General  instructions Eat a low-salt (low-sodium) diet as told by your health care provider. Sometimes, eating less salt may reduce swelling. Pay attention to any changes in your symptoms. Moisturize your skin daily to help prevent skin from cracking and draining. Keep all follow-up visits. This is important. Contact a health care provider if: You have a fever. You have swelling in only one leg. You have increased swelling, redness, or pain in one or both of your legs. You have drainage or sores at the area where you have edema. Get help right away if: You have edema that starts suddenly or is getting worse, especially if you are pregnant or have a medical condition. You develop shortness of breath, especially when you are lying down. You have pain in your chest or abdomen. You feel weak. You feel like you will faint. These symptoms may be an emergency. Get help right away. Call 911. Do not wait to see if the symptoms will go away. Do not drive yourself to the hospital. Summary Peripheral edema is swelling that is caused by a buildup of fluid. Peripheral edema most often affects the lower legs, ankles, and feet. Move around often to prevent stiffness and to reduce swelling. Do not sit or stand for long periods of time. Pay attention to any changes in your symptoms. Contact a health care provider if you have edema that starts suddenly or is getting worse, especially if you are pregnant or have a medical condition. Get help right away if you develop shortness of breath, especially when lying down.  This information is not intended to replace advice given to you by your health care provider. Make sure you discuss any questions you have with your health care provider. Document Revised: 07/08/2021 Document Reviewed: 07/08/2021 Elsevier Patient Education  2024 [employer].

## 2024-04-12 ENCOUNTER — Ambulatory Visit: Payer: Self-pay | Admitting: Family

## 2024-04-12 LAB — CBC WITH DIFFERENTIAL/PLATELET
Basophils Absolute: 0.1 10*3/uL (ref 0.0–0.2)
Basos: 1 %
EOS (ABSOLUTE): 0.2 10*3/uL (ref 0.0–0.4)
Eos: 2 %
Hematocrit: 34.9 % (ref 34.0–46.6)
Hemoglobin: 11.2 g/dL (ref 11.1–15.9)
Immature Grans (Abs): 0.1 10*3/uL (ref 0.0–0.1)
Immature Granulocytes: 1 %
Lymphocytes Absolute: 2.1 10*3/uL (ref 0.7–3.1)
Lymphs: 23 %
MCH: 27.2 pg (ref 26.6–33.0)
MCHC: 32.1 g/dL (ref 31.5–35.7)
MCV: 85 fL (ref 79–97)
Monocytes Absolute: 0.7 10*3/uL (ref 0.1–0.9)
Monocytes: 7 %
Neutrophils Absolute: 6 10*3/uL (ref 1.4–7.0)
Neutrophils: 66 %
Platelets: 283 10*3/uL (ref 150–450)
RBC: 4.12 x10E6/uL (ref 3.77–5.28)
RDW: 14.6 % (ref 11.7–15.4)
WBC: 9.2 10*3/uL (ref 3.4–10.8)

## 2024-04-12 LAB — CMP14+EGFR
ALT: 17 IU/L (ref 0–32)
AST: 18 IU/L (ref 0–40)
Albumin: 4.2 g/dL (ref 3.9–4.9)
Alkaline Phosphatase: 102 IU/L (ref 44–121)
BUN/Creatinine Ratio: 11 (ref 9–23)
BUN: 9 mg/dL (ref 6–24)
Bilirubin Total: 0.2 mg/dL (ref 0.0–1.2)
CO2: 19 mmol/L — ABNORMAL LOW (ref 20–29)
Calcium: 9.1 mg/dL (ref 8.7–10.2)
Chloride: 109 mmol/L — ABNORMAL HIGH (ref 96–106)
Creatinine, Ser: 0.8 mg/dL (ref 0.57–1.00)
Globulin, Total: 2.3 g/dL (ref 1.5–4.5)
Glucose: 118 mg/dL — ABNORMAL HIGH (ref 70–99)
Potassium: 3.6 mmol/L (ref 3.5–5.2)
Sodium: 144 mmol/L (ref 134–144)
Total Protein: 6.5 g/dL (ref 6.0–8.5)
eGFR: 95 mL/min/{1.73_m2} (ref >59)

## 2024-04-12 LAB — BRAIN NATRIURETIC PEPTIDE: BNP: 14.5 pg/mL (ref 0.0–100.0)

## 2024-04-13 LAB — SPECIMEN STATUS REPORT

## 2024-04-13 LAB — HGB A1C W/O EAG: Hgb A1c MFr Bld: 6 % — ABNORMAL HIGH (ref 4.8–5.6)

## 2024-04-15 ENCOUNTER — Other Ambulatory Visit: Payer: Self-pay | Admitting: Family Medicine

## 2024-04-15 ENCOUNTER — Other Ambulatory Visit: Payer: Self-pay | Admitting: Family

## 2024-04-15 DIAGNOSIS — R7303 Prediabetes: Secondary | ICD-10-CM

## 2024-04-15 MED ORDER — CEPHALEXIN 500 MG PO CAPS: 500.0000 mg | ORAL_CAPSULE | Freq: Three times a day (TID) | ORAL | 0 refills | Status: DC

## 2024-04-18 ENCOUNTER — Telehealth: Payer: Self-pay

## 2024-04-18 ENCOUNTER — Other Ambulatory Visit: Payer: Self-pay | Admitting: Pharmacy Technician

## 2024-04-18 ENCOUNTER — Other Ambulatory Visit: Payer: Self-pay

## 2024-04-18 NOTE — Telephone Encounter (Signed)
 Patient has had some ins changes in the past month-now should just have Medicaid. PA is needed for Tezspire 

## 2024-04-18 NOTE — Progress Notes (Signed)
 Open encounter in error. Spoke to patient pending insurance. Patient now have insurance started on 04/17/24. Will fill once call center is notified PA is approved and follow up with patient delivery date.

## 2024-04-18 NOTE — Progress Notes (Signed)
Message sent to Tammy 

## 2024-04-19 ENCOUNTER — Other Ambulatory Visit: Payer: Self-pay

## 2024-04-19 ENCOUNTER — Telehealth: Payer: Self-pay

## 2024-04-19 NOTE — Progress Notes (Signed)
 Care Guide Pharmacy Note  04/19/2024 Name: Autumn Davis MRN: [id number] DOB: [birth year]  Referred By: Delfina Feller, FNP Reason for referral: Complex Care Management (Outreach to schedule with Pharm d )   Autumn Davis is a 38 y.o. year old female who is a primary care patient of Delfina Feller, FNP.  Autumn Davis was referred to the pharmacist for assistance related to: prediabetes   Successful contact was made with the patient to discuss pharmacy services including being ready for the pharmacist to call at least 5 minutes before the scheduled appointment time and to have medication bottles and any blood pressure readings ready for review. The patient agreed to meet with the pharmacist via telephone visit on (date/time).05/11/2024  Autumn Davis , RMA     [place]  [facility], [facility] Guide  Direct Dial: [phone]  Website: [email or web address]

## 2024-04-20 NOTE — Telephone Encounter (Signed)
 Will need updated note to get approval since she is on therapy even though she is late for last injection

## 2024-04-21 ENCOUNTER — Other Ambulatory Visit: Payer: Self-pay

## 2024-04-22 ENCOUNTER — Other Ambulatory Visit (HOSPITAL_COMMUNITY): Payer: Self-pay

## 2024-04-22 ENCOUNTER — Other Ambulatory Visit: Payer: Self-pay | Admitting: Allergy and Immunology

## 2024-04-25 ENCOUNTER — Ambulatory Visit: Admitting: Nurse Practitioner

## 2024-04-25 ENCOUNTER — Encounter: Payer: Self-pay | Admitting: Nurse Practitioner

## 2024-04-25 VITALS — BP 125/72 | HR 85 | Temp 97.9°F | Ht 68.0 in | Wt 234.0 lb

## 2024-04-25 DIAGNOSIS — R600 Localized edema: Secondary | ICD-10-CM | POA: Diagnosis not present

## 2024-04-25 NOTE — Progress Notes (Signed)
   Subjective:    Patient ID: Autumn Davis, female    DOB: December 26, 1985, 38 y.o.   MRN: [id number]   Chief Complaint: recheck cellulitis  HPI  Patient was seen 04/08/24 with fever and lower ext ecema. Was dx with cellulitis. Was treated with ace wraps to  lower ext , Keflex  and lasix . She has completed antibiotics, which caused diarrhea. She has been taking imoduim and is improving. 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       Objective:   Physical Exam Constitutional:      Appearance: Normal appearance.  Cardiovascular:     Rate and Rhythm: Normal rate and regular rhythm.     Heart sounds: Normal heart sounds.  Pulmonary:     Breath sounds: Normal breath sounds.  Musculoskeletal:     Left lower leg: Left lower leg edema: mild left foot edema at base of toes.  Skin:    General: Skin is warm.  Neurological:     General: No focal deficit present.     Mental Status: She is alert and oriented to person, place, and time.  Psychiatric:        Mood and Affect: Mood normal.        Behavior: Behavior normal.    BP 125/72   Pulse 85   Temp 97.9 F (36.6 C) (Temporal)   Ht 5\' 8"  (1.727 m)   Wt 234 lb (106.1 kg)   SpO2 94%   BMI 35.58 kg/m         Assessment & Plan:    Autumn Davis in today with chief complaint of Recheck swelling   1. Peripheral edema (Primary) Resolved cellulitis Elevate legs when sitting Do not sit for more then 2 hours without getting up and moving around. Keep follow up appt already scheduled.    The above assessment and management plan was discussed with the patient. The patient verbalized understanding of and has agreed to the management plan. Patient is aware to call the clinic if symptoms persist or worsen. Patient is aware when to return to the clinic for a follow-up visit. Patient educated on when it is appropriate to go to the emergency department.   Mary-Margaret Gaylyn Keas, FNP  s

## 2024-05-02 ENCOUNTER — Telehealth: Admitting: Nurse Practitioner

## 2024-05-02 ENCOUNTER — Encounter: Payer: Self-pay | Admitting: Nurse Practitioner

## 2024-05-02 DIAGNOSIS — L03032 Cellulitis of left toe: Secondary | ICD-10-CM | POA: Diagnosis not present

## 2024-05-02 MED ORDER — SULFAMETHOXAZOLE-TRIMETHOPRIM 800-160 MG PO TABS: 1.0000 | ORAL_TABLET | Freq: Two times a day (BID) | ORAL | 0 refills | Status: AC

## 2024-05-02 NOTE — Patient Instructions (Signed)
 Paronychia Paronychia is an infection of the skin. It happens near a fingernail or toenail. It may cause pain and swelling around the nail. In some cases, a fluid-filled bump (abscess) can form near or under the nail. Often, this condition is not serious, and it clears up with treatment. What are the causes? This condition may be caused by a germ. The germ may be bacteria or a fungus. These germs can enter the body through an opening in the skin, such as a cut or a hangnail. Other causes include: Repeated injuries to your fingernails or toenails. Irritation of the base and sides of the nail (cuticle). What increases the risk? This condition is more likely to develop in people who: Get their hands wet often, such as a dishwasher. Bite their fingernails or the base and sides of their nails. Have other skin problems. Have hangnails or hurt fingertips. Come into contact with chemicals like detergents. Have diabetes. What are the signs or symptoms? Redness and swelling of the skin near the nail. A tender feeling around the nail. Pus-filled bumps under the skin at the base and sides of the nail. Fluid or pus under the nail. Pain in the area. How is this treated? Treatment depends on the cause of your condition and how bad it is. If your condition is mild, it may clear up on its own in a few days or after soaking in warm water. If needed, treatment may include: Antibiotic medicine. Antifungal medicine. A procedure to drain pus from a fluid-filled bump. Medicine to treat irritation and swelling (corticosteroids). Taking off part of an ingrown toenail. A bandage (dressing) may be placed over the nail area. Follow these instructions at home: Wound care Keep the affected area clean. Soak the fingers or toes in warm water as told by your doctor. You may be told to do this for 20 minutes, 2-3 times a day. Keep the area dry when you are not soaking it. Do not try to drain a fluid-filled bump on  your own. Follow instructions from your doctor about how to take care of the affected area. Make sure you: Wash your hands with soap and water for at least 20 seconds before and after you change your bandage. If you cannot use soap and water, use hand sanitizer. Change your bandage as told by your doctor. If you had a fluid-filled bump and your doctor drained it, check the area every day for signs of infection. Check for: Redness, swelling, or pain. Fluid or blood. Warmth. Pus or a bad smell. Medicines  Take over-the-counter and prescription medicines only as told by your doctor. If you were prescribed an antibiotic medicine, take it as told by your doctor. Do not stop taking it even if you start to feel better. General instructions Avoid contact with anything that irritates your skin or that you are allergic to. Do not pick at the affected area. Keep all follow-up visits. Prevention To prevent this condition from happening again: Wear rubber gloves when putting your hands in water for washing dishes or other tasks. Wear gloves if your hands might touch cleaners or chemicals. Avoid injuring your nails or fingertips. Do not bite your nails or tear hangnails. Do not cut your nails very short. Do not cut the skin at the base and sides of the nail. Use clean nail clippers or scissors when trimming nails. Contact a doctor if: You feel worse. You do not get better. You keep having or you have more fluid, blood, or  pus coming from the affected area. Your affected finger, toe, or joint gets swollen or hard to move. You have a fever or chills. There is redness spreading from the affected area. Summary Paronychia is an infection of the skin. It happens near a fingernail or toenail. This condition may cause pain and swelling around the nail. Soak the fingers or toes in warm water as told by your doctor. Often, this condition is not serious, and it clears up with treatment. This information  is not intended to replace advice given to you by your health care provider. Make sure you discuss any questions you have with your health care provider. Document Revised: 02/04/2021 Document Reviewed: 02/04/2021 Elsevier Patient Education  2024 [employer].

## 2024-05-02 NOTE — Progress Notes (Signed)
 Virtual Visit Consent   Autumn Davis, you are scheduled for a virtual visit with Mary-Margaret Gaylyn Keas, FNP, a [facility] provider, today.     Just as with appointments in the office, your consent must be obtained to participate.  Your consent will be active for this visit and any virtual visit you may have with one of our providers in the next 365 days.     If you have a MyChart account, a copy of this consent can be sent to you electronically.  All virtual visits are billed to your insurance company just like a traditional visit in the office.    As this is a virtual visit, video technology does not allow for your provider to perform a traditional examination.  This may limit your provider's ability to fully assess your condition.  If your provider identifies any concerns that need to be evaluated in person or the need to arrange testing (such as labs, EKG, etc.), we will make arrangements to do so.     Although advances in technology are sophisticated, we cannot ensure that it will always work on either your end or our end.  If the connection with a video visit is poor, the visit may have to be switched to a telephone visit.  With either a video or telephone visit, we are not always able to ensure that we have a secure connection.     I need to obtain your verbal consent now.   Are you willing to proceed with your visit today? YES   Elloise Roark has provided verbal consent on 05/02/2024 for a virtual visit (video or telephone).   Mary-Margaret Gaylyn Keas, FNP   Date: 05/02/2024 3:09 PM   Virtual Visit via Video Note   I, Mary-Margaret Gaylyn Keas, connected with Kiyanna Biegler ([id number], 1986/05/11) on 05/02/24 at  2:30 PM EDT by a video-enabled telemedicine application and verified that I am speaking with the correct person using two identifiers.  Location: Patient: Virtual Visit Location Patient: Home Provider: Virtual Visit Location Provider: Mobile   I discussed  the limitations of evaluation and management by telemedicine and the availability of in person appointments. The patient expressed understanding and agreed to proceed.    History of Present Illness: Autumn Davis is a 38 y.o. who identifies as a female who was assigned female at birth, and is being seen today for toe infection.  HPI: Patient has been on antibiotic for cellulits a couple of weeks ago. Now left second toe is erythematous and sore to touch. Erythematous streaks up foot. No drainage    Review of Systems  Constitutional:  Negative for diaphoresis and weight loss.  Eyes:  Negative for blurred vision, double vision and pain.  Respiratory:  Negative for shortness of breath.   Cardiovascular:  Negative for chest pain, palpitations, orthopnea and leg swelling.  Gastrointestinal:  Negative for abdominal pain.  Skin:  Negative for rash.  Neurological:  Negative for dizziness, sensory change, loss of consciousness, weakness and headaches.  Endo/Heme/Allergies:  Negative for polydipsia. Does not bruise/bleed easily.  Psychiatric/Behavioral:  Negative for memory loss. The patient does not have insomnia.   All other systems reviewed and are negative.   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   Lupus (systemic lupus erythematosus) (HCC) 08/29/2014   Fibromyalgia 12/14/2013   Muscle spasticity 11/14/2013   Hyperlipidemia 08/26/2012   IBS (irritable bowel syndrome) 01/22/2012   Depression 01/22/2012   GERD (gastroesophageal reflux disease)    Thyroid  disease     Allergies:  Allergies  Allergen Reactions   Alfuzosin     Possible allergic reaction  in 2024; does fine with Flomax  (Tamsulosin )   Ciprofloxacin  Other (See Comments)    Muscle weakness and numbness  Other reaction(s): Not available  Muscle weakness and numbness Other reaction(s): Not available  Other Reaction(s): Other (See Comments)  Muscle weakness and numbness Other reaction(s): Not available    Muscle weakness and numbness  Other reaction(s): Not available  Muscle weakness and numbness Other reaction(s): Not available   Compazine Other (See Comments)    hallucinations   Prochlorperazine Other (See Comments) and Rash    Pt states it makes her feel like things are crawling on her Other reaction(s): Not available   Medications:  Current Outpatient Medications:    acetaZOLAMIDE  (DIAMOX ) 250 MG tablet, TAKE 1 TABLET EVERY MORNING, TAKE 1 TABLET AT 2PM, AND TAKE 2 TABLETSAT BEDTIME, Disp: 360 tablet, Rfl: 1   albuterol  (VENTOLIN  HFA) 108 (90 Base) MCG/ACT inhaler, Inhale 2 puffs into the lungs every 4 (four) hours as needed for wheezing or shortness of breath., Disp: 18 g, Rfl: 1   amitriptyline  (ELAVIL ) 75 MG tablet, Take 1 tablet (75 mg total) by mouth at bedtime., Disp: 90 tablet, Rfl: 1   Ascorbic Acid (VITAMIN C ADULT GUMMIES PO), Take 2 each by mouth daily., Disp: , Rfl:    atorvastatin  (LIPITOR) 40 MG tablet, Take 1 tablet (40 mg total) by mouth daily., Disp: 90 tablet, Rfl: 1   baclofen  (LIORESAL ) 20 MG tablet, TAKE ONE (1) TABLET BY MOUTH FOUR (4) TIMES DAILY, Disp: 360 each, Rfl: 0   Beclomethasone Dipropionate (QNASL) 80 MCG/ACT AERS, , Disp: , Rfl:    Calcium -Magnesium-Vitamin D  (CALCIUM  1200+D3 PO), Take 1 tablet by mouth daily., Disp: , Rfl:    cetirizine  (ZYRTEC ) 10 MG tablet, TAKE 1 TABLET BY MOUTH TWICE DAILY AS NEEDED FOR ALLERGIES, Disp: 60 tablet, Rfl: 5   cromolyn (OPTICROM) 4 % ophthalmic solution, SMARTSIG:In Eye(s), Disp: , Rfl:    cyanocobalamin  (VITAMIN B12) 1000 MCG/ML injection, INJECT 1ML IM EVERY 30 DAYS, Disp: 3 mL, Rfl: 1   cycloSPORINE  (RESTASIS) 0.05 % ophthalmic emulsion, Place 1 drop into both eyes 2 (two) times daily., Disp: , Rfl:    diclofenac  (VOLTAREN ) 75 MG EC tablet, TAKE ONE TABLET BY MOUTH TWICE DAILY, Disp: 180 tablet, Rfl: 0   docusate sodium (COLACE) 100 MG capsule, Take 100 mg by mouth 2 (two) times daily. PRN, Disp: , Rfl:    EPINEPHRINE  0.3 mg/0.3 mL IJ SOAJ injection, INJECT 0.3MG  INTO THE MUSCLE AS NEEDED FOR ANAPHYLAXIS, Disp: 2 each, Rfl: 2   famotidine  (PEPCID ) 40 MG tablet, Take 1 tablet (40 mg total) by mouth every evening., Disp: 30 tablet, Rfl: 5   furosemide  (LASIX ) 20 MG tablet, Take 1 tablet (20 mg total) by mouth daily., Disp: 90 tablet, Rfl: 1   furosemide  (LASIX ) 40 MG tablet, Take 1 tablet (40 mg total) by mouth daily., Disp: 5 tablet, Rfl: 0   gabapentin  (NEURONTIN ) 600 MG tablet, TAKE 1 TABLET BY MOUTH 3 TIMES DAILY & TAKE 2 TABLETS AT BEDTIME, Disp: 450 tablet, Rfl: 1  ipratropium-albuterol  (DUONEB) 0.5-2.5 (3) MG/3ML SOLN, Take 3 mLs by nebulization every 4 (four) hours as needed., Disp: 100 mL, Rfl: 2   L-Methylfolate 15 MG TABS, Take 1 tablet (15 mg total) by mouth daily., Disp: 90 tablet, Rfl: 1   MAGNESIUM GLYCINATE PO, Take 2 tablets by mouth daily., Disp: , Rfl:    metoprolol  succinate (TOPROL -XL) 25 MG 24 hr tablet, Take 1 tablet (25 mg total) by mouth daily., Disp: 90 tablet, Rfl: 1   mirabegron  ER (MYRBETRIQ ) 25 MG TB24 tablet, Take 1 tablet (25 mg total) by mouth daily., Disp: 30 tablet, Rfl: 11   mometasone -formoterol  (DULERA) 200-5 MCG/ACT AERO, 2 inhalations 1-2 times per day w/ spacer, Disp: 13 g, Rfl: 5   montelukast  (SINGULAIR ) 10 MG tablet, Take 1 tablet (10 mg total) by mouth at bedtime., Disp: 90 tablet, Rfl: 1   Multiple Vitamins-Minerals (ZINC PO), Take 1 tablet by mouth daily., Disp: , Rfl:    mupirocin  ointment (BACTROBAN ) 2 %, APPLY A SMALL AMOUNT TO THE AFFECTED AREA BY TOPICAL ROUTE 3 TIMES PER DAY, Disp: , Rfl:    norethindrone (MICRONOR) 0.35 MG tablet, Take 1  tablet by mouth daily., Disp: , Rfl:    nystatin  (MYCOSTATIN ) 100000 UNIT/ML suspension, Use as directed 5 mLs (500,000 Units total) in the mouth or throat 4 (four) times daily., Disp: 60 mL, Rfl: 2   nystatin  cream (MYCOSTATIN ), Apply 1 application topically 2 (two) times daily., Disp: 30 g, Rfl: 2   nystatin -triamcinolone  ointment (MYCOLOG), Apply 1 application topically 2 (two) times daily., Disp: 30 g, Rfl: 0   Omega 3 1000 MG CAPS, Take 1 capsule by mouth daily., Disp: , Rfl:    omeprazole  (PRILOSEC) 40 MG capsule, Take 1 capsule (40 mg total) by mouth 2 (two) times daily., Disp: 60 capsule, Rfl: 5   oxyCODONE -acetaminophen  (PERCOCET) 7.5-325 MG tablet, Take 1 tablet by mouth 5 (five) times daily as needed for moderate pain (pain score 4-6). No More Than 5 a day. Do Not Fill Before 03/24/2024, Disp: 140 tablet, Rfl: 0   phenazopyridine  (PYRIDIUM ) 100 MG tablet, Take 1 tablet (100 mg total) by mouth 3 (three) times daily as needed for pain., Disp: 10 tablet, Rfl: 0   predniSONE  (DELTASONE ) 5 MG tablet, TAKE ONE TABLET BY MOUTH EVERY MORNING, Disp: 90 tablet, Rfl: 1   Probiotic Product (PROBIOTIC-10 PO), Take by mouth., Disp: , Rfl:    promethazine  (PHENERGAN ) 12.5 MG tablet, TAKE 1 TABLET BY MOUTH EVERY 8 HOURS AS NEEDED FOR NAUSEA & VOMITING, Disp: 30 tablet, Rfl: 0   tamsulosin  (FLOMAX ) 0.4 MG CAPS capsule, Take 1 capsule (0.4 mg total) by mouth daily after breakfast., Disp: 90 capsule, Rfl: 1   Tezepelumab -ekko (TEZSPIRE ) 210 MG/1. SOAJ, Inject 210 mg into the skin every 28 (twenty-eight) days., Disp: 1.91 mL, Rfl: 11   Vitamin D , Ergocalciferol , (DRISDOL ) 1.25 MG (50000 UNIT) CAPS capsule, TAKE 1 CAPSULE BY MOUTH EVERY 7 DAYS, Disp: 12 capsule, Rfl: 1   vitamin E 180 MG (400 UNITS) capsule, Take 1,000 Units by mouth daily. , Disp: , Rfl:   Current Facility-Administered Medications:    tezepelumab -ekko (TEZSPIRE ) 210 MG/1. syringe 210 mg, 210 mg, Subcutaneous, Q28 days, Kozlow, Rema Care, MD, 210 mg at 02/29/24 1603  Observations/Objective: Patient is well-developed, well-nourished in no acute distress.  Resting comfortably  at home.  Head is normocephalic, atraumatic.  No labored breathing.  Speech is clear and coherent with logical content.  Patient is alert and oriented at baseline.  Skin around left seond toe is erythemaotous and swollen. Sore to touch.   Assessment and Plan:  Autumn Davis in today with chief complaint of No chief complaint on file.   1. Paronychia of second toe of left foot (Primary) Soak in epsom salt daily Do not pick at area If still looking bad on Thursday- come in for rocephin shot Meds ordered this encounter  Medications   sulfamethoxazole -trimethoprim  (BACTRIM  DS) 800-160 MG tablet    Sig: Take 1 tablet by mouth 2 (two) times daily.    Dispense:  20 tablet    Refill:  0    Supervising Provider:   Jolyne Needs A [[id number]]       Follow Up Instructions: I discussed the assessment and treatment plan with the patient. The patient was provided an opportunity to ask questions and all were answered. The patient agreed with the plan and demonstrated an understanding of the instructions.  A copy of instructions were sent to the patient via MyChart.  The patient was advised to call back or seek an in-person evaluation if the symptoms worsen or if the condition fails to improve as anticipated.  Time:  I spent 7 minutes with the patient via telehealth technology discussing the above problems/concerns.    Mary-Margaret Gaylyn Keas, FNP

## 2024-05-03 ENCOUNTER — Ambulatory Visit (INDEPENDENT_AMBULATORY_CARE_PROVIDER_SITE_OTHER): Admitting: Allergy and Immunology

## 2024-05-03 VITALS — BP 124/82 | HR 98 | Temp 98.6°F | Resp 18 | Ht 67.32 in | Wt 235.8 lb

## 2024-05-03 DIAGNOSIS — Z6836 Body mass index (BMI) 36.0-36.9, adult: Secondary | ICD-10-CM | POA: Diagnosis not present

## 2024-05-03 DIAGNOSIS — K219 Gastro-esophageal reflux disease without esophagitis: Secondary | ICD-10-CM | POA: Diagnosis not present

## 2024-05-03 DIAGNOSIS — J3089 Other allergic rhinitis: Secondary | ICD-10-CM | POA: Diagnosis not present

## 2024-05-03 DIAGNOSIS — E66812 Obesity, class 2: Secondary | ICD-10-CM | POA: Diagnosis not present

## 2024-05-03 DIAGNOSIS — T783XXD Angioneurotic edema, subsequent encounter: Secondary | ICD-10-CM

## 2024-05-03 DIAGNOSIS — J4550 Severe persistent asthma, uncomplicated: Secondary | ICD-10-CM | POA: Diagnosis not present

## 2024-05-03 DIAGNOSIS — J383 Other diseases of vocal cords: Secondary | ICD-10-CM

## 2024-05-03 NOTE — Patient Instructions (Addendum)
  1.  Allergen avoidance measures  - dust mite, Alfuzosin, Myrbetriq   2.  Continue to treat and prevent inflammation:  A.  Dulera 200 - 2 inhalations 1-2 times per day w/ spacer  B.  Montelukast  10 mg -1 tablet 1 time per day C.  OTC Rhinocort   - 1-2 sprays each nostril 3-7 times per week D.  Restart Tezepelumab  injection every 4 weeks  3.  Treat and prevent reflux/LPR:  A.  Minimize all forms of caffeine consumption B.  Omeprazole  40 mg -1 tablet twice a day C.  Famotidine  40 mg -1 tablet in evening D.  Replace throat clearing with swallowing maneuver E.  Nissen fundoplication???  4.  If needed:  A. Albuterol  HFA - 2 inhalations or nebulization every 4-6 hours B. Antihistamine - cetirizine  10 mg - 1-2 tabs 1-2 times per day  C. Epi-pen  5. Follow through with sleep study  6. Revisit with rheumatologist about joint pain  7. GLP-1 agonist  8. Return to clinic in 12 weeks or earlier if problem  9. Influenza = Tamiflu . Covid = Paxlovid

## 2024-05-03 NOTE — Progress Notes (Signed)
 [place] - [place] - [place] - [name] - [facility]   Follow-up Note  Referring Provider: Delfina Feller, * Primary Provider: Delfina Feller, FNP Date of Office Visit: 05/03/2024  Subjective:   Autumn Davis (DOB: 06/16/1986) is a 38 y.o. female who [facility] in re-evaluation of the following:  HPI: Archana returns to this clinic in reevaluation of asthma, vocal cord dysfunction, laryngeal spasm, history of angioedema, reflux.  I last saw her in this clinic 15 December 2023.  It appears that her recurrent episodes of angioedema was secondary to the use of her Myrbetriq  for when she stopped this medication she rarely had any issues with angioedema.  There was 1 episode of pharyngitis that she developed since she stopped that agent 6 months ago but that was relatively short-lived.  She believes that her asthma had been under very good control when she used her tezepelumab  injections but because of a logistical issue and an insurance issue she did not continue with those injections and since then she has had to use her short acting bronchodilator a few times per week even while she continues on anti-inflammatory agents for her airway.  She believes that her nose is doing pretty well at this point on her current plan.  It does not sound as though she has required a systemic steroid or antibiotic for an airway issue.  Her reflux is still horrendous even while using omeprazole  and famotidine .  She has regurgitation and she is burning in her throat and raspy voice.  She has consolidated her caffeine consumption to 1 time per day and she does not really eat much chocolate or alcohol .  She was going to obtain a sleep study and that got delayed because of an insurance issue but she is going to arrange to have that performed.  She was going to speak with her rheumatologist about her joint pain issue but that also got delayed because  of an insurance issue but she is going to follow through with that as well.  Allergies as of 05/03/2024       Reactions   Alfuzosin    Possible allergic reaction in 2024; does fine with Flomax  (Tamsulosin )   Ciprofloxacin  Other (See Comments)   Muscle weakness and numbness Other reaction(s): Not available Muscle weakness and numbness Other reaction(s): Not available Other Reaction(s): Other (See Comments) Muscle weakness and numbness Other reaction(s): Not available    Muscle weakness and numbness  Other reaction(s): Not available  Muscle weakness and numbness Other reaction(s): Not available   Compazine Other (See Comments)   hallucinations   Prochlorperazine Other (See Comments), Rash   Pt states it makes her feel like things are crawling on her Other reaction(s): Not available        Medication List    acetaZOLAMIDE  250 MG tablet Commonly known as: DIAMOX  TAKE 1 TABLET EVERY MORNING, TAKE 1 TABLET AT 2PM, AND TAKE 2 TABLETSAT BEDTIME   albuterol  108 (90 Base) MCG/ACT inhaler Commonly known as: Ventolin  HFA Inhale 2 puffs into the lungs every 4 (four) hours as needed for wheezing or shortness of breath.   amitriptyline  75 MG tablet Commonly known as: ELAVIL  Take 1 tablet (75 mg total) by mouth at bedtime.   atorvastatin  40 MG tablet Commonly known as: LIPITOR Take 1 tablet (40 mg total) by mouth daily.   baclofen  20 MG tablet Commonly known as: LIORESAL  TAKE ONE (1) TABLET BY MOUTH FOUR (4) TIMES DAILY  CALCIUM  1200+D3 PO Take 1 tablet by mouth daily.   cetirizine  10 MG tablet Commonly known as: ZYRTEC  TAKE 1 TABLET BY MOUTH TWICE DAILY AS NEEDED FOR ALLERGIES   cromolyn 4 % ophthalmic solution Commonly known as: OPTICROM SMARTSIG:In Eye(s)   cyanocobalamin  1000 MCG/ML injection Commonly known as: VITAMIN B12 INJECT 1ML IM EVERY 30 DAYS   cycloSPORINE 0.05 % ophthalmic emulsion Commonly known as: RESTASIS Place 1 drop into both eyes 2 (two) times  daily.   diclofenac  75 MG EC tablet Commonly known as: VOLTAREN  TAKE ONE TABLET BY MOUTH TWICE DAILY   EPINEPHrine  0.3 mg/0.3 mL Soaj injection Commonly known as: EPI-PEN INJECT 0.3MG  INTO THE MUSCLE AS NEEDED FOR ANAPHYLAXIS   famotidine  40 MG tablet Commonly known as: PEPCID  Take 1 tablet (40 mg total) by mouth every evening.   furosemide  20 MG tablet Commonly known as: LASIX  Take 1 tablet (20 mg total) by mouth daily.   gabapentin  600 MG tablet Commonly known as: NEURONTIN  TAKE 1 TABLET BY MOUTH 3 TIMES DAILY & TAKE 2 TABLETS AT BEDTIME   ipratropium-albuterol  0.5-2.5 (3) MG/3ML Soln Commonly known as: DUONEB Take 3 mLs by nebulization every 4 (four) hours as needed.   L-Methylfolate 15 MG Tabs Take 1 tablet (15 mg total) by mouth daily.   MAGNESIUM GLYCINATE PO Take 2 tablets by mouth daily.   metoprolol  succinate 25 MG 24 hr tablet Commonly known as: TOPROL -XL Take 1 tablet (25 mg total) by mouth daily.   mometasone -formoterol  200-5 MCG/ACT Aero Commonly known as: DULERA 2 inhalations 1-2 times per day w/ spacer   montelukast  10 MG tablet Commonly known as: SINGULAIR  Take 1 tablet (10 mg total) by mouth at bedtime.   mupirocin  ointment 2 % Commonly known as: BACTROBAN  APPLY A SMALL AMOUNT TO THE AFFECTED AREA BY TOPICAL ROUTE 3 TIMES PER DAY   norethindrone 0.35 MG tablet Commonly known as: MICRONOR Take 1 tablet by mouth daily.   nystatin  100000 UNIT/ML suspension Commonly known as: MYCOSTATIN  Use as directed 5 mLs (500,000 Units total) in the mouth or throat 4 (four) times daily.   nystatin  cream Commonly known as: MYCOSTATIN  Apply 1 application topically 2 (two) times daily.   nystatin -triamcinolone  ointment Commonly known as: MYCOLOG Apply 1 application topically 2 (two) times daily.   Omega 3 1000 MG Caps Take 1 capsule by mouth daily.   omeprazole  40 MG capsule Commonly known as: PRILOSEC Take 1 capsule (40 mg total) by mouth 2 (two)  times daily.   oxyCODONE -acetaminophen  7.5-325 MG tablet Commonly known as: Percocet Take 1 tablet by mouth 5 (five) times daily as needed for moderate pain (pain score 4-6). No More Than 5 a day. Do Not Fill Before 03/24/2024   predniSONE  5 MG tablet Commonly known as: DELTASONE  TAKE ONE TABLET BY MOUTH EVERY MORNING   PROBIOTIC-10 PO Take by mouth.   promethazine  12.5 MG tablet Commonly known as: PHENERGAN  TAKE 1 TABLET BY MOUTH EVERY 8 HOURS AS NEEDED FOR NAUSEA & VOMITING   sulfamethoxazole -trimethoprim  800-160 MG tablet Commonly known as: Bactrim  DS Take 1 tablet by mouth 2 (two) times daily.   tamsulosin  0.4 MG Caps capsule Commonly known as: FLOMAX  Take 1 capsule (0.4 mg total) by mouth daily after breakfast.   Tezspire  210 MG/1. Soaj Generic drug: Tezepelumab -ekko Inject 210 mg into the skin every 28 (twenty-eight) days.   VITAMIN C ADULT GUMMIES PO Take 2 each by mouth daily.   Vitamin D  (Ergocalciferol ) 1.25 MG (50000 UNIT) Caps capsule Commonly  known as: DRISDOL  TAKE 1 CAPSULE BY MOUTH EVERY 7 DAYS   vitamin E 180 MG (400 UNITS) capsule Take 1,000 Units by mouth daily.   ZINC PO Take 1 tablet by mouth daily.    Past Medical History:  Diagnosis Date   Allergy    Arthritis    HANDS,HIPS,KNEES   Asthma    Bronchitis, chronic/intermittent 01/22/2012   Depression 01/22/2012   GERD (gastroesophageal reflux disease)    Hyperlipidemia    IBS (irritable bowel syndrome) 01/22/2012   Lupus    Neuromuscular disorder (HCC)    Osteoporosis    Seizures (HCC)    SOB (shortness of breath)    Thyroid  disease     Past Surgical History:  Procedure Laterality Date   51 HOUR PH STUDY N/A 10/29/2015   Procedure: 24 HOUR PH STUDY;  Surgeon: Kenney Peacemaker, MD;  Location: [facility];  Service: Endoscopy;  Laterality: N/A;   COLONOSCOPY     ESOPHAGEAL MANOMETRY N/A 10/29/2015   Procedure: ESOPHAGEAL MANOMETRY (EM);  Surgeon: Kenney Peacemaker, MD;  Location: [facility];  Service: Endoscopy;  Laterality: N/A;   KNEE SURGERY  2002/2003   bil   RECTAL SURGERY  correction of prolapse   2010    Review of systems negative except as noted in HPI / PMHx or noted below:  Review of Systems  Constitutional: Negative.   HENT: Negative.    Eyes: Negative.   Respiratory: Negative.    Cardiovascular: Negative.   Gastrointestinal: Negative.   Genitourinary: Negative.   Musculoskeletal: Negative.   Skin: Negative.   Neurological: Negative.   Endo/Heme/Allergies: Negative.   Psychiatric/Behavioral: Negative.       Objective:   Vitals:   05/03/24 1612  BP: 124/82  Pulse: 98  Resp: 18  Temp: 98.6 F (37 C)  SpO2: 97%   Height: 5' 7.32 (171 cm)  Weight: 235 lb 12.8 oz (107 kg)   Physical Exam Constitutional:      Appearance: She is not diaphoretic.  HENT:     Head: Normocephalic.     Right Ear: Tympanic membrane, ear canal and external ear normal.     Left Ear: Tympanic membrane, ear canal and external ear normal.     Nose: Nose normal. No mucosal edema or rhinorrhea.     Mouth/Throat:     Pharynx: Uvula midline. No oropharyngeal exudate.   Eyes:     Conjunctiva/sclera: Conjunctivae normal.   Neck:     Thyroid : No thyromegaly.     Trachea: Trachea normal. No tracheal tenderness or tracheal deviation.   Cardiovascular:     Rate and Rhythm: Normal rate and regular rhythm.     Heart sounds: Normal heart sounds, S1 normal and S2 normal. No murmur heard. Pulmonary:     Effort: No respiratory distress.     Breath sounds: Normal breath sounds. No stridor. No wheezing or rales.  Lymphadenopathy:     Head:     Right side of head: No tonsillar adenopathy.     Left side of head: No tonsillar adenopathy.     Cervical: No cervical adenopathy.   Skin:    Findings: No erythema or rash.     Nails: There is no clubbing.   Neurological:     Mental Status: She is alert.     Diagnostics: Spirometry was performed and demonstrated an  FEV1 of 2.60 at 76 % of predicted.  Assessment and Plan:   1. Not well controlled severe persistent asthma  2. Perennial allergic rhinitis   3. LPRD (laryngopharyngeal reflux disease)   4. Vocal cord dysfunction   5. Angioedema, subsequent encounter   6. Gastroesophageal reflux disease without esophagitis   7. Class 2 severe obesity due to excess calories with serious comorbidity and body mass index (BMI) of 36.0 to 36.9 in adult (HCC)    1.  Allergen avoidance measures  - dust mite, Alfuzosin, Myrbetriq   2.  Continue to treat and prevent inflammation:  A.  Dulera 200 - 2 inhalations 1-2 times per day w/ spacer  B.  Montelukast  10 mg -1 tablet 1 time per day C.  OTC Rhinocort   - 1-2 sprays each nostril 3-7 times per week D.  Restart Tezepelumab  injection every 4 weeks  3.  Treat and prevent reflux/LPR:  A.  Minimize all forms of caffeine consumption B.  Omeprazole  40 mg -1 tablet twice a day C.  Famotidine  40 mg -1 tablet in evening D.  Replace throat clearing with swallowing maneuver E.  Nissen fundoplication???  4.  If needed:  A. Albuterol  HFA - 2 inhalations or nebulization every 4-6 hours B. Antihistamine - cetirizine  10 mg - 1-2 tabs 1-2 times per day  C. Epi-pen  5. Follow through with sleep study  6. Revisit with rheumatologist about joint pain  7. GLP-1 agonist  8. Return to clinic in 12 weeks or earlier if problem  9. Influenza = Tamiflu . Covid = Paxlovid  Baleria really needs to restart her tezepelumab  as that is the one agent that is giving rise to almost complete control of all of her respiratory tract issues and will allow her to taper down all of her other medications directed against respiratory tract inflammation.  Her reflux is horrendous and I think she has 2 options.  She could seek out the option of a Nissen fundoplication.  She could attempt to lose weight using a GLP-1 agonist.  She is going to discuss the GLP-1 agonist with her primary care doctor.   She needs to follow-up with a sleep study and with her rheumatologist about her joint pain issue.  I do not think we need to have her undergo any further evaluation for recurrent angioedema at this point as it did appear to be secondary to her Myrbetriq .  Will see her back in his clinic in 12 weeks or earlier if there is a problem.  Schuyler Custard, MD Allergy / Immunology [facility]

## 2024-05-04 ENCOUNTER — Encounter: Payer: Self-pay | Admitting: Allergy and Immunology

## 2024-05-04 ENCOUNTER — Telehealth: Payer: Self-pay | Admitting: *Deleted

## 2024-05-04 MED ORDER — MONTELUKAST SODIUM 10 MG PO TABS: 10.0000 mg | ORAL_TABLET | Freq: Every evening | ORAL | 1 refills | Status: DC

## 2024-05-04 MED ORDER — MOMETASONE FURO-FORMOTEROL FUM 200-5 MCG/ACT IN AERO: 2.0000 | INHALATION_SPRAY | Freq: Two times a day (BID) | RESPIRATORY_TRACT | 1 refills | Status: DC

## 2024-05-04 MED ORDER — BUDESONIDE 32 MCG/ACT NA SUSP: 2.0000 | Freq: Every morning | NASAL | 1 refills | Status: DC

## 2024-05-04 MED ORDER — FAMOTIDINE 40 MG PO TABS: 40.0000 mg | ORAL_TABLET | Freq: Every evening | ORAL | 1 refills | Status: DC

## 2024-05-04 MED ORDER — OMEPRAZOLE 40 MG PO CPDR: 40.0000 mg | DELAYED_RELEASE_CAPSULE | Freq: Two times a day (BID) | ORAL | 1 refills | Status: DC

## 2024-05-04 NOTE — Telephone Encounter (Signed)
-----   Message from ERIC J KOZLOW sent at 05/04/2024  6:38 AM EDT ----- D.  Restart Tezepelumab  injection every 4 weeks

## 2024-05-04 NOTE — Telephone Encounter (Signed)
 L/m for patient to contact Melodee Spruce to reorder with new Ins coverage and number given to pharmacy to reorder

## 2024-05-04 NOTE — Addendum Note (Signed)
 Addended by: Anthon Baston A on: 05/04/2024 11:33 AM   Modules accepted: Orders

## 2024-05-06 ENCOUNTER — Other Ambulatory Visit: Payer: Self-pay

## 2024-05-06 ENCOUNTER — Encounter: Payer: Self-pay | Admitting: Registered Nurse

## 2024-05-06 ENCOUNTER — Encounter: Attending: Registered Nurse | Admitting: Registered Nurse

## 2024-05-06 VITALS — Ht 67.0 in | Wt 235.0 lb

## 2024-05-06 DIAGNOSIS — G40802 Other epilepsy, not intractable, without status epilepticus: Secondary | ICD-10-CM | POA: Diagnosis not present

## 2024-05-06 DIAGNOSIS — M62838 Other muscle spasm: Secondary | ICD-10-CM

## 2024-05-06 DIAGNOSIS — M25561 Pain in right knee: Secondary | ICD-10-CM

## 2024-05-06 DIAGNOSIS — R1319 Other dysphagia: Secondary | ICD-10-CM

## 2024-05-06 DIAGNOSIS — M797 Fibromyalgia: Secondary | ICD-10-CM | POA: Diagnosis not present

## 2024-05-06 DIAGNOSIS — F418 Other specified anxiety disorders: Secondary | ICD-10-CM | POA: Diagnosis not present

## 2024-05-06 DIAGNOSIS — G629 Polyneuropathy, unspecified: Secondary | ICD-10-CM

## 2024-05-06 DIAGNOSIS — Z79891 Long term (current) use of opiate analgesic: Secondary | ICD-10-CM | POA: Insufficient documentation

## 2024-05-06 DIAGNOSIS — G8929 Other chronic pain: Secondary | ICD-10-CM | POA: Insufficient documentation

## 2024-05-06 DIAGNOSIS — M25562 Pain in left knee: Secondary | ICD-10-CM | POA: Diagnosis not present

## 2024-05-06 DIAGNOSIS — M2550 Pain in unspecified joint: Secondary | ICD-10-CM | POA: Insufficient documentation

## 2024-05-06 DIAGNOSIS — Z5181 Encounter for therapeutic drug level monitoring: Secondary | ICD-10-CM | POA: Insufficient documentation

## 2024-05-06 DIAGNOSIS — M546 Pain in thoracic spine: Secondary | ICD-10-CM | POA: Insufficient documentation

## 2024-05-06 DIAGNOSIS — G609 Hereditary and idiopathic neuropathy, unspecified: Secondary | ICD-10-CM | POA: Insufficient documentation

## 2024-05-06 DIAGNOSIS — G894 Chronic pain syndrome: Secondary | ICD-10-CM | POA: Insufficient documentation

## 2024-05-06 MED ORDER — OXYCODONE-ACETAMINOPHEN 7.5-325 MG PO TABS: 1.0000 | ORAL_TABLET | Freq: Every day | ORAL | 0 refills | Status: DC | PRN

## 2024-05-06 NOTE — Progress Notes (Signed)
 Subjective:    Patient ID: Autumn Davis, female    DOB: [birth year], 38 y.o.   MRN: [id number]  HPI: Autumn Davis is a 38 y.o. female whose appointment was changed to virtual visit, she called office reporting she was sick with a fever.  I connected with  [name] by a video enabled telemedicine application and verified that I am speaking with the correct person using two identifiers.  Location: Patient: In her Home Provider: In the Office    I discussed the limitations of evaluation and management by telemedicine and the availability of in person appointments. The patient expressed understanding and agreed to proceed.  She states her pain is located in her mid- back. She rates her pain 5. Her current exercise regime is walking and performing stretching exercises.  Autumn Davis is treating her for left foot cellulitis, she will keep this provider updated.   Autumn Davis  equivalent is 56.25 MME.        Pain Inventory Average Pain 8 Pain Right Now 5 My pain is constant, sharp, burning, tingling, and aching  In the last 24 hours, has pain interfered with the following? General activity 7 Relation with others 4 Enjoyment of life 5 What TIME of day is your pain at its worst? evening and night Sleep (in general) Poor  Pain is worse with: walking, bending, sitting, standing, and some activites Pain improves with: rest, therapy/exercise, medication, TENS, and injections Relief from Meds: 8  Family History  Problem Relation Age of Onset  . Thyroid  disease Mother   . Colon polyps Father   . Lung cancer Maternal Grandmother   . Cancer Paternal Grandmother        colon/pancreatiec/lymphoma  . Irritable bowel syndrome Brother   . AAA (abdominal aortic aneurysm) Neg Hx    Social History   Socioeconomic History  . Marital status: Legally Separated    Spouse name: Not on file  . Number of children: 1  . Years of education:  Not on file  . Highest education level: Not on file  Occupational History  . Occupation: disabled    Employer: [employer]  Tobacco Use  . Smoking status: Never  . Smokeless tobacco: Never  Vaping Use  . Vaping status: Never Used  Substance and Sexual Activity  . Alcohol  use: No    Alcohol /week: 0.0 standard drinks of alcohol   . Drug use: No  . Sexual activity: Not Currently  Other Topics Concern  . Not on file  Social History Narrative   She lives alone with her 35 year old son - right now - they are living with her parents right now   Social Drivers of Health   Financial Resource Strain: Low Risk  (05/05/2023)   Overall Financial Resource Strain (CARDIA)   . Difficulty of Paying Living Expenses: Not hard at all  Food Insecurity: No Food Insecurity (05/05/2023)   Hunger Vital Sign   . Worried About Programme researcher, broadcasting/film/video in the Last Year: Never true   . Ran Out of Food in the Last Year: Never true  Transportation Needs: No Transportation Needs (05/05/2023)   PRAPARE - Transportation   . Lack of Transportation (Medical): No   . Lack of Transportation (Non-Medical): No  Physical Activity: Insufficiently Active (05/05/2023)   Exercise Vital Sign   . Days of Exercise per Week: 2 days   . Minutes of Exercise per Session: 30 min  Stress: No Stress  Concern Present (05/05/2023)   [employer] of Occupational Health - Occupational Stress Questionnaire   . Feeling of Stress : Not at all  Social Connections: Moderately Isolated (05/05/2023)   Social Connection and Isolation Panel   . Frequency of Communication with Friends and Family: More than three times a week   . Frequency of Social Gatherings with Friends and Family: More than three times a week   . Attends Religious Services: More than 4 times per year   . Active Member of Clubs or Organizations: No   . Attends Banker Meetings: Never   . Marital Status: Separated   Past Surgical History:  Procedure  Laterality Date  . 24 HOUR PH STUDY N/A 10/29/2015   Procedure: 24 HOUR PH STUDY;  Surgeon: Lupita FORBES Commander, MD;  Location: [facility];  Service: Endoscopy;  Laterality: N/A;  . COLONOSCOPY    . ESOPHAGEAL MANOMETRY N/A 10/29/2015   Procedure: ESOPHAGEAL MANOMETRY (EM);  Surgeon: Lupita FORBES Commander, MD;  Location: [facility];  Service: Endoscopy;  Laterality: N/A;  . KNEE SURGERY  2002/2003   bil  . RECTAL SURGERY  correction of prolapse   2010   Past Surgical History:  Procedure Laterality Date  . 24 HOUR PH STUDY N/A 10/29/2015   Procedure: 24 HOUR PH STUDY;  Surgeon: Lupita FORBES Commander, MD;  Location: [facility];  Service: Endoscopy;  Laterality: N/A;  . COLONOSCOPY    . ESOPHAGEAL MANOMETRY N/A 10/29/2015   Procedure: ESOPHAGEAL MANOMETRY (EM);  Surgeon: Lupita FORBES Commander, MD;  Location: [facility];  Service: Endoscopy;  Laterality: N/A;  . KNEE SURGERY  2002/2003   bil  . RECTAL SURGERY  correction of prolapse   2010   Past Medical History:  Diagnosis Date  . Allergy   . Arthritis    HANDS,HIPS,KNEES  . Asthma   . Bronchitis, chronic/intermittent 01/22/2012  . Depression 01/22/2012  . GERD (gastroesophageal reflux disease)   . Hyperlipidemia   . IBS (irritable bowel syndrome) 01/22/2012  . Lupus   . Neuromuscular disorder (HCC)   . Osteoporosis   . Seizures (HCC)   . SOB (shortness of breath)   . Thyroid  disease    Ht 5' 7 (1.702 m) Comment: Today visit is a Mychart visit. Vitals reported by patient.  Wt 235 lb (106.6 kg) Comment: Today visit is a Mychart visit. Vitals reported by patient.  BMI 36.81 kg/m   Opioid Risk Score:   Fall Risk Score:  `1  Depression screen [facility] 2/9     05/06/2024    1:23 PM 04/25/2024   12:33 PM 12/21/2023   10:21 AM 09/11/2023   10:34 AM 06/02/2023    3:43 PM 05/05/2023    9:49 AM 12/08/2022   11:19 AM  Depression screen PHQ 2/9  Decreased Interest 0 1 2 0 0 0 0  Down, Depressed, Hopeless 0 0 0 0 0 0 0  PHQ - 2 Score 0 1 2 0 0 0 0   Altered sleeping   2  3    Tired, decreased energy   2  1    Change in appetite   0  1    Feeling bad or failure about yourself    0  0    Trouble concentrating   0  0    Moving slowly or fidgety/restless   0  0    Suicidal thoughts   0  0    PHQ-9 Score   6  5  Difficult doing work/chores   Somewhat difficult  Somewhat difficult      Review of Systems  Musculoskeletal:  Positive for back pain.       Pain: both hand, both legs, both feet Rib cage pain in back & front  All other systems reviewed and are negative.      Objective:   Physical Exam Vitals and nursing note reviewed.  Constitutional:      Appearance: Normal appearance.   Musculoskeletal:     Comments: No Physical Exam Performed: Virtual Visit         Assessment & Plan:  1. Chronic seizure disorder: No seizure's. Continue current medication regimen with  Gabapentin . Neurology Following. 05/06/2024. 2. Chronic muscle spasms, weakness with associated pain disorder: Continue current medication regimen with Baclofen . Continue with Exercise regime. 05/06/2024. 3. Chronic dysphagia: No complaints today. Continue to monitor. GI Following. 05/06/2024. 4. Anxiety with depression : No complaints today. Stable. Continue current medication regimen with  Elavil . 05/06/2024. 5. Fibromyalgia/ Rib Pain/Chronic Pain: Continue Diclofenac : Continue with exercise and heat Therapy. 05/06/2024. Refilled:  oxyCODONE  7.5/325mg  one tablet 5 times  daily as needed  #140. Second script sent for the following month. Continue with slow weaning.   We will continue the opioid monitoring program, this consists of regular clinic visits, examinations, urine drug screen, pill counts as well as use of [place]  Controlled Substance Reporting system. A 12 month History has been reviewed on the   Controlled Substance Reporting System 05/06/2024.  6. Peripheral Neuropathy: Continue current medication regimen with  Gabapentin :  05/06/2024. 7. Polyarthralgia: Continue HEP as Tolerated. Continue to Monitor.  05/06/2024  8. Bilateral Chronic Knee Pain: Continue HEP as Tolerated. Continue to Monitor. 05/06/2024    F/U in 2 months Virtual Visit Established Patient Location of Patient: In her Home Location of Provider: In the Office

## 2024-05-06 NOTE — Progress Notes (Signed)
 Specialty Pharmacy Refill Coordination Note  Autumn Davis is a 38 y.o. female contacted today regarding refills of specialty medication(s) Tezepelumab -ekko (TEZSPIRE )   Patient requested Delivery   Delivery date: 05/10/24   Verified address: [address] [place]   Medication will be filled on 05/09/24.

## 2024-05-09 ENCOUNTER — Other Ambulatory Visit: Payer: Self-pay

## 2024-05-10 ENCOUNTER — Telehealth: Payer: Self-pay

## 2024-05-10 NOTE — Telephone Encounter (Signed)
(  Key: [id number]) PA Case ID #: [id number] Submitted for Oxy/APAP

## 2024-05-10 NOTE — Telephone Encounter (Signed)
 Pharmacy Patient Advocate Encounter   Received notification from CoverMyMeds that prior authorization for Diclofenac  Sodium 75MG  dr tablets is required/requested.   Insurance verification completed.   The patient is insured through [facility] .   Per test claim: PA required; PA submitted to above mentioned insurance via CoverMyMeds Key/confirmation #/EOC [id number] Status is pending

## 2024-05-11 ENCOUNTER — Other Ambulatory Visit (HOSPITAL_COMMUNITY): Payer: Self-pay

## 2024-05-11 ENCOUNTER — Telehealth: Payer: Self-pay | Admitting: Pharmacist

## 2024-05-11 ENCOUNTER — Other Ambulatory Visit (INDEPENDENT_AMBULATORY_CARE_PROVIDER_SITE_OTHER): Admitting: Pharmacist

## 2024-05-11 MED ORDER — WEGOVY 0.25 MG/0.5ML ~~LOC~~ SOAJ: 0.2500 mg | SUBCUTANEOUS | 3 refills | Status: DC

## 2024-05-11 MED ORDER — PROMETHAZINE HCL 12.5 MG PO TABS: 12.5000 mg | ORAL_TABLET | Freq: Three times a day (TID) | ORAL | 0 refills | Status: AC | PRN

## 2024-05-11 NOTE — Telephone Encounter (Signed)
 Pharmacy Patient Advocate Encounter  Received notification from [facility] that Prior Authorization for Diclofenac  Sodium 75MG  dr tablets   has been APPROVED from 05/10/2024 to 05/10/2025. Ran test claim, Copay is $4. This test claim was processed through [facility]- copay amounts may vary at other pharmacies due to pharmacy/plan contracts, or as the patient moves through the different stages of their insurance plan.   PA #/Case ID/Reference #: [id number]

## 2024-05-11 NOTE — Progress Notes (Signed)
   05/11/2024 Name: Autumn Davis MRN: [id number] DOB: 1986-10-10  Chief Complaint  Patient presents with   Obesity   Prediabetes    Autumn Davis is a 38 y.o. year old female who presented for a telephone visit.   They were referred to Autumn pharmacist by their PCP for assistance in managing weight management.  Patient reports she is interested in pharmacotherapy for obesity.  She reports weight loss of about 40lbs in 2018 and would like to get back to <200lbs.  She felt her best at this time.  She is aware that she will have to work towards her goal along with Autumn medication.  She is highly motivated to reach her weight loss goal of about 50lbs.  Subjective:  Care Team: Primary Care Provider: Gladis Mustard, FNP    Medication Access/Adherence  Current Pharmacy:  Autumn Davis, [place] - [address] [place] [address] Phone: [phone] Fax: [phone]  Autumn Davis - [facility] [address] [place] [address] Phone: [phone] Fax: [phone]   Patient reports affordability concerns with their medications: No  Patient reports access/transportation concerns to their pharmacy: No  Patient reports adherence concerns with their medications:  No     Obesity/Overweight, Complicated by multiple comorbidities:  Current medications: n/a  Weight Management treatments previously prescribed:   Current meal patterns:  Discussed meal planning options and Plate method for healthy eating Avoid sugary drinks and desserts Incorporate balanced protein, non starchy veggies, 1 serving of carbohydrate with each meal Increase water intake Increase physical activity as able Ensure enough water, protein and fiber intake especially in Autumn setting of GLP1 therapy   Current physical activity: encouraged; reports 4-8k steps at work, but will need to incorporate muscle.strength training  Current  medication access support: medicaid   Objective:  Lab Results  Component Value Date   HGBA1C 6.0 (H) 04/08/2024    Lab Results  Component Value Date   CREATININE 0.80 04/08/2024   BUN 9 04/08/2024   NA 144 04/08/2024   K 3.6 04/08/2024   CL 109 (H) 04/08/2024   CO2 19 (L) 04/08/2024    Lab Results  Component Value Date   CHOL 190 12/21/2023   HDL 48 12/21/2023   LDLCALC 90 12/21/2023   TRIG 317 (H) 12/21/2023   CHOLHDL 4.0 12/21/2023    Medications Reviewed Today   Medications were not reviewed in this encounter       Assessment/Plan:   Obesity/Overweight: - Currently unable to achieve goal weight loss of 5-10% through diet and lifestyle modifications alone - Extensive dietary counseling including education on focus on lean proteins, fruits and vegetables, whole grains and increased fiber consumption, adequate hydration - Extensive exercise counseling including eventual goal of 150 minutes of moderate intensity exercise weekly - Provided motivational interviewing. Discussed setting non-weight based goals - Recommend to :  Start wegovy  0.25 weekly for at least 4 weeks--titrate as needed patient denies personal or family history of medullary thyroid  carcinoma (MTC) or in patients with Multiple Endocrine Neoplasia syndrome type 2 (MEN 2)  Needs promethazine  refill-sent to pcp     Follow Up Plan: f/u PharmD 4 weeks   Autumn Davis, PharmD, BCACP, CPP Clinical Pharmacist, [facility]

## 2024-05-11 NOTE — Telephone Encounter (Signed)
 Approved 05/10/24-11/06/24

## 2024-05-11 NOTE — Telephone Encounter (Signed)
 Wegovy  PA-medicaid 0.25mg  weekly for 28 days BMI 36.81;  240lb; 5'7 NO history on thyroid  cancer (MTC/MEN) Enrolled in diet/lifestyle program No concomitant GLP Not pregnant Please let me know if I can answer any additional questions

## 2024-05-11 NOTE — Telephone Encounter (Signed)
Pt aware of PA approval

## 2024-05-12 ENCOUNTER — Telehealth: Payer: Self-pay

## 2024-05-12 ENCOUNTER — Other Ambulatory Visit (HOSPITAL_COMMUNITY): Payer: Self-pay

## 2024-05-12 NOTE — Telephone Encounter (Signed)
 PA request has been Started. New Encounter has been or will be created for follow up. For additional info see Pharmacy Prior Auth telephone encounter from 05/12/2024.

## 2024-05-12 NOTE — Telephone Encounter (Signed)
 Pharmacy Patient Advocate Encounter  Received notification from [facility] that Prior Authorization for Wegovy  0.25 has been APPROVED from 05/12/24 to 11/08/24. Ran test claim, Copay is $4.00. This test claim was processed through [facility]- copay amounts may vary at other pharmacies due to pharmacy/plan contracts, or as the patient moves through the different stages of their insurance plan.   PA #/Case ID/Reference #: [id number]

## 2024-05-12 NOTE — Telephone Encounter (Signed)
 Patient aware.

## 2024-05-12 NOTE — Telephone Encounter (Signed)
 Pharmacy Patient Advocate Encounter   Received notification from CoverMyMeds that prior authorization for Wegovy  0.25 is required/requested.   Insurance verification completed.   The patient is insured through [facility] .   Per test claim: PA required; PA submitted to above mentioned insurance via CoverMyMeds Key/confirmation #/EOC [id number] Status is pending

## 2024-05-20 ENCOUNTER — Other Ambulatory Visit: Payer: Self-pay | Admitting: Family

## 2024-05-20 ENCOUNTER — Other Ambulatory Visit: Payer: Self-pay | Admitting: Nurse Practitioner

## 2024-05-20 DIAGNOSIS — K581 Irritable bowel syndrome with constipation: Secondary | ICD-10-CM

## 2024-05-20 DIAGNOSIS — R600 Localized edema: Secondary | ICD-10-CM

## 2024-06-01 ENCOUNTER — Other Ambulatory Visit: Payer: Self-pay

## 2024-06-03 ENCOUNTER — Other Ambulatory Visit: Payer: Self-pay

## 2024-06-06 ENCOUNTER — Other Ambulatory Visit: Payer: Self-pay

## 2024-06-06 ENCOUNTER — Encounter (INDEPENDENT_AMBULATORY_CARE_PROVIDER_SITE_OTHER): Payer: Self-pay

## 2024-06-06 NOTE — Progress Notes (Signed)
 Specialty Pharmacy Refill Coordination Note  Autumn Davis is a 38 y.o. female contacted today regarding refills of specialty medication(s) Tezepelumab -ekko (TEZSPIRE )   Patient requested Delivery   Delivery date: 06/07/24   Verified address: [address] [place], [place] [address]   Medication will be filled on 07.21.25.

## 2024-06-08 ENCOUNTER — Other Ambulatory Visit (INDEPENDENT_AMBULATORY_CARE_PROVIDER_SITE_OTHER)

## 2024-06-08 MED ORDER — WEGOVY 0.5 MG/0.5ML ~~LOC~~ SOAJ: 0.5000 mg | SUBCUTANEOUS | 3 refills | Status: DC

## 2024-06-08 NOTE — Progress Notes (Signed)
 06/08/2024 Name: Autumn Davis MRN: [id number] DOB: 1986/06/04  Chief Complaint  Patient presents with   Obesity    Autumn Davis is a 38 y.o. year old female who presented for a telephone visit.   They were referred to the pharmacist by their PCP for assistance in managing weight management.   Subjective:  Reports new start Wegovy  does make her feel nauseous at times. She tries to incorporate more protein and eat more. Reports she is down to 222.2lb (~19lbs down).  Care Team: Primary Care Provider: Gladis Mustard, FNP   Medication Access/Adherence  Current Pharmacy:  [facility], [place] - [address] [place] [address] Phone: [phone] Fax: [phone]  DARRYLE LONG - [facility] [address] [place] [address] Phone: [phone] Fax: [phone]  Patient reports affordability concerns with their medications: No  Patient reports access/transportation concerns to their pharmacy: No  Patient reports adherence concerns with their medications:  No    Obesity/Overweight:  Current medications: Wegovy   Weight Management treatments previously prescribed: n/a  Current meal patterns:  Discussed meal planning options and Plate method for healthy eating Avoid sugary drinks and desserts Incorporate balanced protein, non starchy veggies, 1 serving of carbohydrate with each meal Increase water intake Increase physical activity as able  Increasing protein--aiming for 0.8gm per kilogram - Drinks: increasing water intake Ensure enough water, protein and fiber intake especially in the setting of GLP1 therapy   Current physical activity: active  Current medication access support: medicaid  Objective:  Lab Results  Component Value Date   HGBA1C 6.0 (H) 04/08/2024    Lab Results  Component Value Date   CREATININE 0.80 04/08/2024   BUN 9 04/08/2024   NA 144 04/08/2024   K 3.6  04/08/2024   CL 109 (H) 04/08/2024   CO2 19 (L) 04/08/2024    Lab Results  Component Value Date   CHOL 190 12/21/2023   HDL 48 12/21/2023   LDLCALC 90 12/21/2023   TRIG 317 (H) 12/21/2023   CHOLHDL 4.0 12/21/2023    Medications Reviewed Today     Reviewed by Billee Mliss BIRCH, [facility] (Pharmacist) on 06/08/24 at 1007  Med List Status: <None>   Medication Order Taking? Sig Documenting Provider Last Dose Status Informant  acetaZOLAMIDE  (DIAMOX ) 250 MG tablet [id number]  TAKE 1 TABLET EVERY MORNING, TAKE 1 TABLET AT 2PM, AND TAKE 2 TABLETSAT BEDTIME Gladis, Mary-Margaret, FNP  Active   albuterol  (VENTOLIN  HFA) 108 (90 Base) MCG/ACT inhaler [id number]  Inhale 2 puffs into the lungs every 4 (four) hours as needed for wheezing or shortness of breath. Kozlow, Camellia PARAS, MD  Active   amitriptyline  (ELAVIL ) 75 MG tablet [id number]  Take 1 tablet (75 mg total) by mouth at bedtime. Gladis, Mary-Margaret, FNP  Active   Ascorbic Acid (VITAMIN C ADULT GUMMIES PO) [id number]  Take 2 each by mouth daily. [provider]  Active   atorvastatin  (LIPITOR) 40 MG tablet [id number]  Take 1 tablet (40 mg total) by mouth daily. Gladis, Mary-Margaret, FNP  Active   baclofen  (LIORESAL ) 20 MG tablet [id number]  TAKE ONE (1) TABLET BY MOUTH FOUR (4) TIMES DAILY Gladis, Mary-Margaret, FNP  Active   budesonide  (RHINOCORT  AQUA) 32 MCG/ACT nasal spray [id number]  Place 2 sprays into both nostrils every morning. Kozlow, Camellia PARAS, MD  Active   Calcium -Magnesium-Vitamin D  (CALCIUM  1200+D3 PO) [id number]  Take 1 tablet by mouth daily. [provider]  Active   cetirizine  (ZYRTEC ) 10 MG tablet [id number]  TAKE 1 TABLET BY MOUTH TWICE DAILY AS NEEDED FOR ALLERGIES Kozlow, Camellia PARAS, MD  Active   cromolyn (OPTICROM) 4 % ophthalmic solution [id number]  SMARTSIG:In Eye(s) [provider]  Active   cyanocobalamin  (VITAMIN B12) 1000 MCG/ML injection [id number]  INJECT 1ML IM EVERY 30 DAYS Gladis, Mary-Margaret, FNP  Active    cycloSPORINE (RESTASIS) 0.05 % ophthalmic emulsion [id number]  Place 1 drop into both eyes 2 (two) times daily. [provider]  Active   diclofenac  (VOLTAREN ) 75 MG EC tablet [id number]  TAKE ONE TABLET BY MOUTH TWICE DAILY Gladis, Mary-Margaret, FNP  Active   EPINEPHRINE  0.3 mg/0.3 mL IJ SOAJ injection [id number]  INJECT 0.3MG  INTO THE MUSCLE AS NEEDED FOR ANAPHYLAXIS Kozlow, Camellia PARAS, MD  Active   EPINEPHRINE  BASE IN [id number]   [provider]  Active   famotidine  (PEPCID ) 40 MG tablet [id number]  Take 1 tablet (40 mg total) by mouth at bedtime. Kozlow, Eric J, MD  Active   furosemide  (LASIX ) 20 MG tablet [id number]  TAKE ONE (1) TABLET BY MOUTH EVERY DAY Gladis, Mary-Margaret, FNP  Active   gabapentin  (NEURONTIN ) 600 MG tablet [id number]  TAKE 1 TABLET BY MOUTH 3 TIMES DAILY & TAKE 2 TABLETS AT BEDTIME Gladis, Mary-Margaret, FNP  Active   ipratropium-albuterol  (DUONEB) 0.5-2.5 (3) MG/3ML SOLN [id number]  Take 3 mLs by nebulization every 4 (four) hours as needed. Kozlow, Eric J, MD  Active   L-Methylfolate 15 MG TABS [id number]  Take 1 tablet (15 mg total) by mouth daily. Gladis Mary-Margaret, FNP  Active   MAGNESIUM GLYCINATE PO [id number]  Take 2 tablets by mouth daily. [provider]  Active   metoprolol  succinate (TOPROL -XL) 25 MG 24 hr tablet [id number]  Take 1 tablet (25 mg total) by mouth daily. Gladis, Mary-Margaret, FNP  Active   mometasone -formoterol  (DULERA) 200-5 MCG/ACT TERESE [id number]  Inhale 2 puffs into the lungs in the morning and at bedtime. 2 inhalations 1-2 times per day w/ spacer Kozlow, Camellia PARAS, MD  Active   montelukast  (SINGULAIR ) 10 MG tablet [id number]  Take 1 tablet (10 mg total) by mouth at bedtime. Kozlow, Camellia PARAS, MD  Active   Multiple Vitamins-Minerals (ZINC PO) [id number]  Take 1 tablet by mouth daily. [provider]  Active   mupirocin  ointment (BACTROBAN ) 2 % [id number]  APPLY A SMALL AMOUNT TO THE AFFECTED AREA BY TOPICAL ROUTE 3 TIMES PER  DAY [provider]  Active   norethindrone (MICRONOR) 0.35 MG tablet [id number]  Take 1 tablet by mouth daily. [provider]  Active   nystatin  (MYCOSTATIN ) 100000 UNIT/ML suspension [id number]  Use as directed 5 mLs (500,000 Units total) in the mouth or throat 4 (four) times daily. Gladis, Mary-Margaret, FNP  Active   nystatin  cream (MYCOSTATIN ) [id number]  Apply 1 application topically 2 (two) times daily. Gladis, Mary-Margaret, FNP  Active   nystatin -triamcinolone  ointment [facility]) [id number]  Apply 1 application topically 2 (two) times daily. Gladis, Mary-Margaret, FNP  Active   Omega 3 1000 MG CAPS [id number]  Take 1 capsule by mouth daily. [provider]  Active   omeprazole  (PRILOSEC) 40 MG capsule [id number]  Take 1 capsule (40 mg total) by mouth 2 (two) times daily. Kozlow, Camellia PARAS, MD  Active   oxyCODONE -acetaminophen  (PERCOCET) 7.5-325 MG tablet [id number]  Take 1 tablet by mouth 5 (five) times daily as needed for moderate pain (pain score 4-6). No More Than 5 a day. Do  Not Fill Before 03/24/2024 Debby Fidela CROME, NP  Active   predniSONE  (DELTASONE ) 5 MG tablet [id number]  TAKE ONE TABLET BY MOUTH EVERY MORNING Gladis Mary-Margaret, FNP  Active   Probiotic Product (PROBIOTIC-10 PO) [id number]  Take by mouth. [provider]  Active   promethazine  (PHENERGAN ) 12.5 MG tablet [id number]  Take 1 tablet (12.5 mg total) by mouth every 8 (eight) hours as needed. Gladis Mustard, FNP  Active     Discontinued 06/08/24 1001   Semaglutide -Weight Management (WEGOVY ) 0.5 MG/0.5ML SOAJ [id number] Yes Inject 0.5 mg into the skin once a week. Gladis, Mary-Margaret, FNP  Active   sulfamethoxazole -trimethoprim  (BACTRIM  DS) 800-160 MG tablet [id number]  Take 1 tablet by mouth 2 (two) times daily. Gladis, Mary-Margaret, FNP  Active   tamsulosin  (FLOMAX ) 0.4 MG CAPS capsule [id number]  Take 1 capsule (0.4 mg total) by mouth daily after breakfast. Gladis, Mary-Margaret, FNP   Active   Tezepelumab -ekko (TEZSPIRE ) 210 MG/1. SOAJ [id number]  Inject 210 mg into the skin every 28 (twenty-eight) days. Kozlow, Eric J, MD  Active   tezepelumab -ekko (TEZSPIRE ) 210 MG/1. syringe 210 mg [id number]   Kozlow, Eric J, MD  Active   Vitamin D , Ergocalciferol , (DRISDOL ) 1.25 MG (50000 UNIT) CAPS capsule [id number]  TAKE 1 CAPSULE BY MOUTH EVERY 7 DAYS Gladis, Mary-Margaret, FNP  Active   vitamin E 180 MG (400 UNITS) capsule [id number]  Take 1,000 Units by mouth daily.  [provider]  Active Self  Med List Note Cathy Fidela CROME, NP 03/01/14 [phone]): Gabapentin .           Assessment/Plan:   Obesity/Overweight: - Currently unable to achieve goal weight loss of 5-10% through diet and lifestyle modifications alone - Extensive dietary counseling including education on focus on lean proteins, fruits and vegetables, whole grains and increased fiber consumption, adequate hydration - Extensive exercise counseling including eventual goal of 150 minutes of moderate intensity exercise weekly - Provided motivational interviewing. Discussed setting non-weight based goals - Recommend to :  INCREASE Wegovy  0.5MG  weekly for at least 8 weeks (rapid weight loss this past month) patient denies personal or family history of medullary thyroid  carcinoma (MTC) or in patients with Multiple Endocrine Neoplasia syndrome type 2 (MEN 2)           Follow Up Plan: PCP in August  Mliss Tarry Griffin, PharmD, BCACP, CPP Clinical Pharmacist, [facility]

## 2024-06-10 ENCOUNTER — Other Ambulatory Visit: Payer: Self-pay | Admitting: Nurse Practitioner

## 2024-06-17 ENCOUNTER — Other Ambulatory Visit: Payer: Self-pay

## 2024-06-20 ENCOUNTER — Ambulatory Visit: Payer: 59 | Admitting: Nurse Practitioner

## 2024-06-24 ENCOUNTER — Encounter: Payer: Self-pay | Attending: Registered Nurse | Admitting: Registered Nurse

## 2024-06-27 ENCOUNTER — Encounter: Attending: Registered Nurse | Admitting: Registered Nurse

## 2024-06-27 ENCOUNTER — Encounter: Payer: Self-pay | Admitting: Registered Nurse

## 2024-06-27 VITALS — BP 124/84 | HR 94 | Ht 67.5 in | Wt 213.6 lb

## 2024-06-27 DIAGNOSIS — Z79891 Long term (current) use of opiate analgesic: Secondary | ICD-10-CM | POA: Diagnosis not present

## 2024-06-27 DIAGNOSIS — M546 Pain in thoracic spine: Secondary | ICD-10-CM | POA: Diagnosis not present

## 2024-06-27 DIAGNOSIS — M797 Fibromyalgia: Secondary | ICD-10-CM | POA: Diagnosis not present

## 2024-06-27 DIAGNOSIS — M2550 Pain in unspecified joint: Secondary | ICD-10-CM | POA: Insufficient documentation

## 2024-06-27 DIAGNOSIS — G894 Chronic pain syndrome: Secondary | ICD-10-CM | POA: Diagnosis not present

## 2024-06-27 DIAGNOSIS — G8929 Other chronic pain: Secondary | ICD-10-CM | POA: Diagnosis not present

## 2024-06-27 DIAGNOSIS — Z5181 Encounter for therapeutic drug level monitoring: Secondary | ICD-10-CM | POA: Insufficient documentation

## 2024-06-27 MED ORDER — OXYCODONE-ACETAMINOPHEN 7.5-325 MG PO TABS: 1.0000 | ORAL_TABLET | Freq: Every day | ORAL | 0 refills | Status: DC | PRN

## 2024-06-27 NOTE — Progress Notes (Signed)
 Subjective:    Patient ID: Autumn Davis, female    DOB: Dec 10, 1985, 38 y.o.   MRN: [id number]  HPI: Autumn Davis is a 38 y.o. female who returns for follow up appointment for chronic pain and medication refill. She states her pain is located in her mid- back. She rates her pain 4. Her current exercise regime is walking and performing stretching exercises.  [name]  equivalent is 52.50 MME.   Oral Swab was Performed today.     Pain Inventory Average Pain 7 Pain Right Now 4 My pain is constant, sharp, burning, stabbing, and aching  In the last 24 hours, has pain interfered with the following? General activity 6 Relation with others 8 Enjoyment of life 5 What TIME of day is your pain at its worst? evening and night Sleep (in general) Poor  Pain is worse with: bending, sitting, inactivity, standing, and some activites Pain improves with: rest, heat/ice, therapy/exercise, pacing activities, medication, and TENS Relief from Meds: 8  Family History  Problem Relation Age of Onset   Thyroid  disease Mother    Colon polyps Father    Lung cancer Maternal Grandmother    Cancer Paternal Grandmother        colon/pancreatiec/lymphoma   Irritable bowel syndrome Brother    AAA (abdominal aortic aneurysm) Neg Hx    Social History   Socioeconomic History   Marital status: Legally Separated    Spouse name: Not on file   Number of children: 1   Years of education: Not on file   Highest education level: Not on file  Occupational History   Occupation: disabled    Employer: [employer]  Tobacco Use   Smoking status: Never   Smokeless tobacco: Never  Vaping Use   Vaping status: Never Used  Substance and Sexual Activity   Alcohol  use: No    Alcohol /week: 0.0 standard drinks of alcohol    Drug use: No   Sexual activity: Not Currently  Other Topics Concern   Not on file  Social History Narrative   She lives alone with her 67 year old son -  right now - they are living with her parents right now   Social Drivers of Corporate investment banker Strain: Low Risk  (05/05/2023)   Overall Financial Resource Strain (CARDIA)    Difficulty of Paying Living Expenses: Not hard at all  Food Insecurity: No Food Insecurity (05/05/2023)   Hunger Vital Sign    Worried About Running Out of Food in the Last Year: Never true    Ran Out of Food in the Last Year: Never true  Transportation Needs: No Transportation Needs (05/05/2023)   PRAPARE - Administrator, Civil Service (Medical): No    Lack of Transportation (Non-Medical): No  Physical Activity: Insufficiently Active (05/05/2023)   Exercise Vital Sign    Days of Exercise per Week: 2 days    Minutes of Exercise per Session: 30 min  Stress: No Stress Concern Present (05/05/2023)   [employer] of Occupational Health - Occupational Stress Questionnaire    Feeling of Stress : Not at all  Social Connections: Moderately Isolated (05/05/2023)   Social Connection and Isolation Panel    Frequency of Communication with Friends and Family: More than three times a week    Frequency of Social Gatherings with Friends and Family: More than three times a week    Attends Religious Services: More than 4 times per year  Active Member of Clubs or Organizations: No    Attends Banker Meetings: Never    Marital Status: Separated   Past Surgical History:  Procedure Laterality Date   71 HOUR PH STUDY N/A 10/29/2015   Procedure: 24 HOUR PH STUDY;  Surgeon: Lupita FORBES Commander, MD;  Location: [facility];  Service: Endoscopy;  Laterality: N/A;   COLONOSCOPY     ESOPHAGEAL MANOMETRY N/A 10/29/2015   Procedure: ESOPHAGEAL MANOMETRY (EM);  Surgeon: Lupita FORBES Commander, MD;  Location: [facility];  Service: Endoscopy;  Laterality: N/A;   KNEE SURGERY  2002/2003   bil   RECTAL SURGERY  correction of prolapse   2010   Past Surgical History:  Procedure Laterality Date   21 HOUR PH STUDY N/A  10/29/2015   Procedure: 24 HOUR PH STUDY;  Surgeon: Lupita FORBES Commander, MD;  Location: [facility];  Service: Endoscopy;  Laterality: N/A;   COLONOSCOPY     ESOPHAGEAL MANOMETRY N/A 10/29/2015   Procedure: ESOPHAGEAL MANOMETRY (EM);  Surgeon: Lupita FORBES Commander, MD;  Location: [facility];  Service: Endoscopy;  Laterality: N/A;   KNEE SURGERY  2002/2003   bil   RECTAL SURGERY  correction of prolapse   2010   Past Medical History:  Diagnosis Date   Allergy    Arthritis    HANDS,HIPS,KNEES   Asthma    Bronchitis, chronic/intermittent 01/22/2012   Depression 01/22/2012   GERD (gastroesophageal reflux disease)    Hyperlipidemia    IBS (irritable bowel syndrome) 01/22/2012   Lupus    Neuromuscular disorder (HCC)    Osteoporosis    Seizures (HCC)    SOB (shortness of breath)    Thyroid  disease    There were no vitals taken for this visit.  Opioid Risk Score:   Fall Risk Score:  `1  Depression screen [facility] 2/9     05/06/2024    1:23 PM 04/25/2024   12:33 PM 12/21/2023   10:21 AM 09/11/2023   10:34 AM 06/02/2023    3:43 PM 05/05/2023    9:49 AM 12/08/2022   11:19 AM  Depression screen PHQ 2/9  Decreased Interest 0 1 2 0 0 0 0  Down, Depressed, Hopeless 0 0 0 0 0 0 0  PHQ - 2 Score 0 1 2 0 0 0 0  Altered sleeping   2  3    Tired, decreased energy   2  1    Change in appetite   0  1    Feeling bad or failure about yourself    0  0    Trouble concentrating   0  0    Moving slowly or fidgety/restless   0  0    Suicidal thoughts   0  0    PHQ-9 Score   6  5    Difficult doing work/chores   Somewhat difficult  Somewhat difficult       Review of Systems  Constitutional: Negative.   HENT: Negative.    Eyes: Negative.   Respiratory: Negative.    Cardiovascular: Negative.   Gastrointestinal: Negative.   Endocrine: Negative.   Genitourinary: Negative.   Musculoskeletal:  Positive for arthralgias and back pain.  Skin: Negative.   Allergic/Immunologic: Negative.   Neurological:  Negative.   Hematological: Negative.   Psychiatric/Behavioral: Negative.         Objective:   Physical Exam Vitals and nursing note reviewed.  Constitutional:      Appearance: Normal appearance.  Cardiovascular:  Rate and Rhythm: Normal rate and regular rhythm.     Pulses: Normal pulses.     Heart sounds: Normal heart sounds.  Pulmonary:     Effort: Pulmonary effort is normal.     Breath sounds: Normal breath sounds.  Musculoskeletal:     Comments: Normal Muscle Bulk and Muscle Testing Reveals:  Upper Extremities: Full ROM and Muscle Strength 5/5  Thoracic Paraspinal Tenderness: T-4-T-6   Lower Extremities: Full ROM and Muscle Strength 5/5  Arises from Table with Ease  Narrow Based  Gait     Skin:    General: Skin is warm and dry.  Neurological:     Mental Status: She is alert and oriented to person, place, and time.  Psychiatric:        Mood and Affect: Mood normal.        Behavior: Behavior normal.          Assessment & Plan:  1. Chronic seizure disorder: No seizure's. Continue current medication regimen with  Gabapentin . Neurology Following. 06/27/2024. 2. Chronic muscle spasms, weakness with associated pain disorder: Continue current medication regimen with Baclofen . Continue with Exercise regime. 06/27/2024. 3. Chronic dysphagia: No complaints today. Continue to monitor. GI Following. 06/27/2024. 4. Anxiety with depression : No complaints today. Stable. Continue current medication regimen with  Elavil . 06/27/2024. 5. Fibromyalgia/ Rib Pain/Chronic Pain: Continue Diclofenac : Continue with exercise and heat Therapy. 06/27/2024. Refilled:  oxyCODONE  7.5/325mg  one tablet 5 times  daily as needed  #135. Second script sent for the following month. Continue with slow weaning.   We will continue the opioid monitoring program, this consists of regular clinic visits, examinations, urine drug screen, pill counts as well as use of [place]  Controlled Substance  Reporting system. A 12 month History has been reviewed on the [place]  Controlled Substance Reporting System 06/27/2024.  6. Peripheral Neuropathy: Continue current medication regimen with  Gabapentin : 06/27/2024. 7. Polyarthralgia: Continue HEP as Tolerated. Continue to Monitor.  06/27/2024  8. Bilateral Chronic Knee Pain: No complaints today. Continue HEP as Tolerated. Continue to Monitor. 06/27/2024     F/U in 2 months

## 2024-06-29 ENCOUNTER — Other Ambulatory Visit: Payer: Self-pay

## 2024-06-30 ENCOUNTER — Other Ambulatory Visit: Payer: Self-pay | Admitting: Nurse Practitioner

## 2024-06-30 DIAGNOSIS — K581 Irritable bowel syndrome with constipation: Secondary | ICD-10-CM

## 2024-06-30 LAB — DRUG TOX MONITOR 1 W/CONF, ORAL FLD
Amphetamines: NEGATIVE ng/mL (ref <10)
Barbiturates: NEGATIVE ng/mL (ref <10)
Benzodiazepines: NEGATIVE ng/mL (ref <0.50)
Buprenorphine: NEGATIVE ng/mL (ref <0.10)
Cocaine: NEGATIVE ng/mL (ref <5.0)
Codeine: NEGATIVE ng/mL (ref <2.5)
Dihydrocodeine: NEGATIVE ng/mL (ref <2.5)
Fentanyl: NEGATIVE ng/mL (ref <0.10)
Heroin Metabolite: NEGATIVE ng/mL (ref <1.0)
Hydrocodone: NEGATIVE ng/mL (ref <2.5)
Hydromorphone: NEGATIVE ng/mL (ref <2.5)
MARIJUANA: NEGATIVE ng/mL (ref <2.5)
MDMA: NEGATIVE ng/mL (ref <10)
Meprobamate: NEGATIVE ng/mL (ref <2.5)
Methadone: NEGATIVE ng/mL (ref <5.0)
Morphine: NEGATIVE ng/mL (ref <2.5)
Nicotine Metabolite: NEGATIVE ng/mL (ref <5.0)
Norhydrocodone: NEGATIVE ng/mL (ref <2.5)
Noroxycodone: 9.9 ng/mL — ABNORMAL HIGH (ref <2.5)
Opiates: POSITIVE ng/mL — AB (ref <2.5)
Oxycodone: 86.4 ng/mL — ABNORMAL HIGH (ref <2.5)
Oxymorphone: NEGATIVE ng/mL (ref <2.5)
Phencyclidine: NEGATIVE ng/mL (ref <10)
Tapentadol: NEGATIVE ng/mL (ref <5.0)
Tramadol: NEGATIVE ng/mL (ref <5.0)
Zolpidem: NEGATIVE ng/mL (ref <5.0)

## 2024-06-30 LAB — DRUG TOX ALC METAB W/CON, ORAL FLD: Alcohol Metabolite: NEGATIVE ng/mL (ref <25)

## 2024-07-01 ENCOUNTER — Other Ambulatory Visit: Payer: Self-pay

## 2024-07-02 ENCOUNTER — Encounter (INDEPENDENT_AMBULATORY_CARE_PROVIDER_SITE_OTHER): Payer: Self-pay

## 2024-07-04 ENCOUNTER — Other Ambulatory Visit: Payer: Self-pay

## 2024-07-04 NOTE — Progress Notes (Signed)
 Specialty Pharmacy Refill Coordination Note  Autumn Davis is a 38 y.o. female contacted today regarding refills of specialty medication(s) Tezepelumab -ekko (TEZSPIRE )   Patient requested Delivery   Delivery date: 07/05/24   Verified address: [address] [place], [place] [address]   Medication will be filled on 08.18.25.

## 2024-07-11 ENCOUNTER — Other Ambulatory Visit: Payer: Self-pay | Admitting: Nurse Practitioner

## 2024-07-11 ENCOUNTER — Ambulatory Visit: Admitting: Nurse Practitioner

## 2024-07-20 ENCOUNTER — Other Ambulatory Visit: Payer: Self-pay | Admitting: Nurse Practitioner

## 2024-07-20 DIAGNOSIS — K581 Irritable bowel syndrome with constipation: Secondary | ICD-10-CM

## 2024-07-20 DIAGNOSIS — I479 Paroxysmal tachycardia, unspecified: Secondary | ICD-10-CM

## 2024-07-23 DIAGNOSIS — H5213 Myopia, bilateral: Secondary | ICD-10-CM | POA: Diagnosis not present

## 2024-07-27 ENCOUNTER — Other Ambulatory Visit: Payer: Self-pay

## 2024-07-28 ENCOUNTER — Other Ambulatory Visit: Payer: Self-pay

## 2024-07-28 ENCOUNTER — Other Ambulatory Visit (HOSPITAL_COMMUNITY): Payer: Self-pay

## 2024-07-28 NOTE — Progress Notes (Signed)
 Specialty Pharmacy Refill Coordination Note  Spoke with Autumn Davis is a 38 y.o. female contacted today regarding refills of specialty medication(s) Tezepelumab -ekko (TEZSPIRE )  Doses on hand: 0  Injection date: 08/02/24   Patient requested: Delivery   Delivery date: 07/29/24   Verified address: [address] [place] [address]  Medication will be filled on 07/28/24.

## 2024-07-29 ENCOUNTER — Ambulatory Visit: Admitting: Nurse Practitioner

## 2024-07-29 ENCOUNTER — Encounter: Payer: Self-pay | Admitting: Nurse Practitioner

## 2024-07-29 VITALS — BP 136/86 | HR 80 | Temp 98.3°F | Ht 67.0 in | Wt 207.0 lb

## 2024-07-29 DIAGNOSIS — E079 Disorder of thyroid, unspecified: Secondary | ICD-10-CM

## 2024-07-29 DIAGNOSIS — R7303 Prediabetes: Secondary | ICD-10-CM

## 2024-07-29 DIAGNOSIS — R350 Frequency of micturition: Secondary | ICD-10-CM

## 2024-07-29 DIAGNOSIS — M797 Fibromyalgia: Secondary | ICD-10-CM | POA: Diagnosis not present

## 2024-07-29 DIAGNOSIS — Z6833 Body mass index (BMI) 33.0-33.9, adult: Secondary | ICD-10-CM | POA: Diagnosis not present

## 2024-07-29 DIAGNOSIS — I479 Paroxysmal tachycardia, unspecified: Secondary | ICD-10-CM | POA: Diagnosis not present

## 2024-07-29 DIAGNOSIS — E782 Mixed hyperlipidemia: Secondary | ICD-10-CM

## 2024-07-29 DIAGNOSIS — R600 Localized edema: Secondary | ICD-10-CM

## 2024-07-29 DIAGNOSIS — R1319 Other dysphagia: Secondary | ICD-10-CM

## 2024-07-29 DIAGNOSIS — K219 Gastro-esophageal reflux disease without esophagitis: Secondary | ICD-10-CM | POA: Diagnosis not present

## 2024-07-29 DIAGNOSIS — M329 Systemic lupus erythematosus, unspecified: Secondary | ICD-10-CM

## 2024-07-29 DIAGNOSIS — D8989 Other specified disorders involving the immune mechanism, not elsewhere classified: Secondary | ICD-10-CM

## 2024-07-29 DIAGNOSIS — E559 Vitamin D deficiency, unspecified: Secondary | ICD-10-CM | POA: Diagnosis not present

## 2024-07-29 DIAGNOSIS — K581 Irritable bowel syndrome with constipation: Secondary | ICD-10-CM

## 2024-07-29 DIAGNOSIS — F5101 Primary insomnia: Secondary | ICD-10-CM

## 2024-07-29 DIAGNOSIS — R569 Unspecified convulsions: Secondary | ICD-10-CM

## 2024-07-29 LAB — LIPID PANEL

## 2024-07-29 LAB — BAYER DCA HB A1C WAIVED: HB A1C (BAYER DCA - WAIVED): 5.3 % (ref 4.8–5.6)

## 2024-07-29 MED ORDER — WEGOVY 1 MG/0.5ML ~~LOC~~ SOAJ: 1.0000 mg | SUBCUTANEOUS | 2 refills | Status: AC

## 2024-07-29 MED ORDER — PREDNISONE 5 MG PO TABS: 5.0000 mg | ORAL_TABLET | Freq: Every morning | ORAL | 1 refills | Status: AC

## 2024-07-29 MED ORDER — ACETAZOLAMIDE 250 MG PO TABS: ORAL_TABLET | ORAL | 1 refills | Status: AC

## 2024-07-29 MED ORDER — AMITRIPTYLINE HCL 75 MG PO TABS: 75.0000 mg | ORAL_TABLET | Freq: Every day | ORAL | 1 refills | Status: AC

## 2024-07-29 MED ORDER — BACLOFEN 20 MG PO TABS: ORAL_TABLET | ORAL | 1 refills | Status: AC

## 2024-07-29 MED ORDER — FUROSEMIDE 20 MG PO TABS: 20.0000 mg | ORAL_TABLET | Freq: Every day | ORAL | 1 refills | Status: AC

## 2024-07-29 MED ORDER — ATORVASTATIN CALCIUM 40 MG PO TABS: 40.0000 mg | ORAL_TABLET | Freq: Every day | ORAL | 1 refills | Status: AC

## 2024-07-29 MED ORDER — METOPROLOL SUCCINATE ER 25 MG PO TB24: 25.0000 mg | ORAL_TABLET | Freq: Every day | ORAL | 1 refills | Status: AC

## 2024-07-29 MED ORDER — GABAPENTIN 600 MG PO TABS: ORAL_TABLET | ORAL | 1 refills | Status: AC

## 2024-07-29 NOTE — Addendum Note (Signed)
 Addended by: Dajana Gehrig, MARY-MARGARET on: 07/29/2024 12:49 PM   Modules accepted: Orders

## 2024-07-29 NOTE — Patient Instructions (Signed)
 Semaglutide  Injection (Weight Management) What is this medication? SEMAGLUTIDE  (SEM a GLOO tide) promotes weight loss. It may also be used to maintain weight loss.  It works by decreasing appetite. It can be used to lower the risk of heart attack and stroke in people affected by excess weight. Changes to diet and exercise are often combined with this medication. This medicine may be used for other purposes; ask your health care provider or pharmacist if you have questions. COMMON BRAND NAME(S): Wegovy  What should I tell my care team before I take this medication? They need to know if you have any of these conditions: Diabetes Eye disease caused by diabetes Gallbladder disease Have or have had depression Have or have had pancreatitis Having surgery Kidney disease Personal or family history of MEN 2, a condition that causes endocrine gland tumors Personal or family history of thyroid  cancer Stomach or intestine problems, such as problems digesting food Suicidal thoughts, plans, or attempt An unusual or allergic reaction to semaglutide , other medications, foods, dyes, or preservatives Pregnant or trying to get pregnant Breastfeeding How should I use this medication? This medication is injected under the skin. You will be taught how to prepare and give it. Take it as directed on the prescription label. It is given once every week (every 7 days). Keep taking it unless your care team tells you to stop. It is important that you put your used needles and pens in a special sharps container. Do not put them in a trash can. If you do not have a sharps container, call your pharmacist or care team to get one. A special MedGuide will be given to you by the pharmacist with each prescription and refill. Be sure to read this information carefully each time. This medication comes with INSTRUCTIONS FOR USE. Ask your pharmacist for directions on how to use this medication. Read the information carefully. Talk  to your pharmacist or care team if you have questions. Talk to your care team about the use of this medication in children. While it may be prescribed for children as young as 12 years for selected conditions, precautions do apply. Overdosage: If you think you have taken too much of this medicine contact a poison control center or emergency room at once. NOTE: This medicine is only for you. Do not share this medicine with others. What if I miss a dose? If you miss a dose and the next scheduled dose is more than 2 days away, take the missed dose as soon as possible. If you miss a dose and the next scheduled dose is less than 2 days away, do not take the missed dose. Take the next dose at your regular time. Do not take double or extra doses. If you miss your dose for 2 weeks or more, take the next dose at your regular time or call your care team to talk about how to restart this medication. What may interact with this medication? Insulin and other medications for diabetes This list may not describe all possible interactions. Give your health care provider a list of all the medicines, herbs, non-prescription drugs, or dietary supplements you use. Also tell them if you smoke, drink alcohol, or use illegal drugs. Some items may interact with your medicine. What should I watch for while using this medication? Visit your care team for regular checks on your progress. Tell your care team if your condition does not start to get better or if it gets worse. Tell your care team if  you are taking medication to treat diabetes, such as insulin or glipizide. This may increase your risk of low blood sugar. Know the symptoms of low blood sugar and how to treat it. Talk to your care team about your risk of cancer. You may be more at risk for certain types of cancer if you take this medication. Talk to your care team right away if you have a lump or swelling in your neck, hoarseness that does not go away, trouble  swallowing, shortness of breath, or trouble breathing. Make sure you stay hydrated while taking this medication. Drink water  often. Eat fruits and veggies that have a high water  content. Drink more water  when it is hot or you are active. Talk to your care team right away if you have fever, infection, vomiting, diarrhea, or if you sweat a lot while taking this medication. The loss of too much body fluid may make it dangerous for you to take this medication. If you are going to need surgery or a procedure, tell your care team that you are taking this medication. Do not take this medication without first talking to your care team if you may be or could become pregnant. Your care team can help you find the option that works for you. Weight loss is not recommended during pregnancy. Talk to your care team if you are breastfeeding. When recommended, this medication may be taken. Its use during breastfeeding has not been well studied. Your care team may suggest other options. What side effects may I notice from receiving this medication? Side effects that you should report to your care team as soon as possible: Allergic reactions--skin rash, itching, hives, swelling of the face, lips, tongue, or throat Change in vision Dehydration--increased thirst, dry mouth, feeling faint or lightheaded, headache, dark yellow or brown urine Fast or irregular heartbeat Gallbladder problems--severe stomach pain, nausea, vomiting, fever Kidney injury--decrease in the amount of urine, swelling of the ankles, hands, or feet Pancreatitis--severe stomach pain that spreads to your back or gets worse after eating or when touched, fever, nausea, vomiting Thoughts of suicide or self-harm, worsening mood, feelings of depression Thyroid  cancer--new mass or lump in the neck, pain or trouble swallowing, trouble breathing, hoarseness Side effects that usually do not require medical attention (report these to your care team if they  continue or are bothersome): Constipation Diarrhea Loss of appetite Nausea Upset stomach This list may not describe all possible side effects. Call your doctor for medical advice about side effects. You may report side effects to FDA at 1-800-FDA-1088. Where should I keep my medication? Keep out of the reach of children and pets. Refrigeration (preferred): Store in the refrigerator. Do not freeze. Keep this medication in the original container until you are ready to take it. Get rid of any unused medication after the expiration date. Room temperature: If needed, prior to cap removal, the pen can be stored at room temperature for up to 28 days. Protect from light. If it is stored at room temperature, get rid of any unused medication after 28 days or after it expires, whichever is first. It is important to get rid of the medication as soon as you no longer need it or it is expired. You can do this in two ways: Take the medication to a medication take-back program. Check with your pharmacy or law enforcement to find a location. If you cannot return the medication, follow the directions in the MedGuide. NOTE: This sheet is a summary. It may not  cover all possible information. If you have questions about this medicine, talk to your doctor, pharmacist, or health care provider.  2025 Elsevier/Gold Standard (2023-10-20 00:00:00)

## 2024-07-29 NOTE — Progress Notes (Signed)
 Subjective:    Patient ID: Autumn Davis, female    DOB: 03-17-1986, 38 y.o.   MRN: [id number]   Chief Complaint: medical management of chronic issues     HPI:  Autumn Davis is a 38 y.o. who identifies as a female who was assigned female at birth.   Social history: Lives with: parents Work history: disability   Comes in today for follow up of the following chronic medical issues:  1. Mixed hyperlipidemia Does not watch diet. Does no dedicated exercise Lab Results  Component Value Date   CHOL 190 12/21/2023   HDL 48 12/21/2023   LDLCALC 90 12/21/2023   TRIG 317 (H) 12/21/2023   CHOLHDL 4.0 12/21/2023   The 10-year ASCVD risk score (Arnett DK, et al., 2019) is: 1%    2. Gastroesophageal reflux disease without esophagitis Is on omeprazole  and pepcid  an dis doing well.  3. Esophageal dysphagia Has not had any issues as of late  4. Irritable bowel syndrome with constipation Takes baclofen  and elavil   that works well for her  5. Tachycardia, paroxysmal (HCC) Is on metoprolol  which keeps heart rate down  6. Recurrent major depressive disorder, in full remission (HCC) Currently on no anti depressants    06/27/2024   10:50 AM 05/06/2024    1:23 PM 04/25/2024   12:33 PM  Depression screen PHQ 2/9  Decreased Interest 0 0 1  Down, Depressed, Hopeless 0 0 0  PHQ - 2 Score 0 0 1    7. Fibromyalgia 8. Inflammatory autoimmune disorder (HCC) 9. Systemic lupus erythematosus, unspecified SLE type, unspecified organ involvement status (HCC) Not really sure exactly what auto  immune disorder she has. Her test are inconclusive. She has lots of pain in which she takes neurontin  for and see pain management. She has good days and bad days. She has had multiple flare ups in the last several months which has caused her to need steroid dose pak. Without meds she could not get out of bed.  10. Seizures (HCC) Is on diamox . Has no seizure activity to report  11.  Urinary frequency Has occasional frequency. Is on flomax  and myrbetriq - sees urology yearly  12. Vitamin D  deficiency Is on daily vitamin d  supplement  13. BMI 35.0-35.9,adult Weight is down 6lbs  Wt Readings from Last 3 Encounters:  07/29/24 207 lb (93.9 kg)  06/27/24 213 lb 9.6 oz (96.9 kg)  05/06/24 235 lb (106.6 kg)   BMI Readings from Last 3 Encounters:  07/29/24 32.42 kg/m  06/27/24 32.96 kg/m  05/06/24 36.81 kg/m    New complaints: Insomnia - has been tested in past and was told has sleep apnea, but pulmonologist would not do anything  Allergies  Allergen Reactions   Alfuzosin     Possible allergic reaction in 2024; does fine with Flomax  (Tamsulosin )   Ciprofloxacin  Other (See Comments)    Muscle weakness and numbness  Other reaction(s): Not available  Muscle weakness and numbness Other reaction(s): Not available  Other Reaction(s): Other (See Comments)  Muscle weakness and numbness Other reaction(s): Not available    Muscle weakness and numbness  Other reaction(s): Not available  Muscle weakness and numbness Other reaction(s): Not available   Compazine Other (See Comments)    hallucinations   Prochlorperazine Other (See Comments) and Rash    Pt states it makes her feel like things are crawling on her Other reaction(s): Not available   Zofran  [Ondansetron ] Other (See Comments)    Reports reaction;  can only tolerate phenergan  tremors   Outpatient Encounter Medications as of 07/29/2024  Medication Sig   acetaZOLAMIDE  (DIAMOX ) 250 MG tablet TAKE 1 TABLET EVERY MORNING, TAKE 1 TABLET AT 2PM, AND TAKE 2 TABLETSAT BEDTIME   albuterol  (VENTOLIN  HFA) 108 (90 Base) MCG/ACT inhaler Inhale 2 puffs into the lungs every 4 (four) hours as needed for wheezing or shortness of breath.   amitriptyline  (ELAVIL ) 75 MG tablet TAKE ONE TABLET BY MOUTH AT BEDTIME   Ascorbic Acid (VITAMIN C ADULT GUMMIES PO) Take 2 each by mouth daily.   atorvastatin  (LIPITOR) 40 MG tablet Take  1 tablet (40 mg total) by mouth daily.   baclofen  (LIORESAL ) 20 MG tablet TAKE ONE (1) TABLET BY MOUTH FOUR (4) TIMES DAILY   budesonide  (RHINOCORT  AQUA) 32 MCG/ACT nasal spray Place 2 sprays into both nostrils every morning.   Calcium -Magnesium-Vitamin D  (CALCIUM  1200+D3 PO) Take 1 tablet by mouth daily.   cetirizine  (ZYRTEC ) 10 MG tablet TAKE 1 TABLET BY MOUTH TWICE DAILY AS NEEDED FOR ALLERGIES   cromolyn (OPTICROM) 4 % ophthalmic solution SMARTSIG:In Eye(s)   cyanocobalamin  (VITAMIN B12) 1000 MCG/ML injection INJECT 1ML IM EVERY 30 DAYS   cycloSPORINE (RESTASIS) 0.05 % ophthalmic emulsion Place 1 drop into both eyes 2 (two) times daily.   diclofenac  (VOLTAREN ) 75 MG EC tablet TAKE ONE TABLET BY MOUTH TWICE DAILY   EPINEPHRINE  0.3 mg/0.3 mL IJ SOAJ injection INJECT 0.3MG  INTO THE MUSCLE AS NEEDED FOR ANAPHYLAXIS   EPINEPHRINE  BASE IN    famotidine  (PEPCID ) 40 MG tablet Take 1 tablet (40 mg total) by mouth at bedtime.   furosemide  (LASIX ) 20 MG tablet TAKE ONE (1) TABLET BY MOUTH EVERY DAY   gabapentin  (NEURONTIN ) 600 MG tablet TAKE 1 TABLET BY MOUTH 3 TIMES DAILY & TAKE 2 TABLETS AT BEDTIME   ipratropium-albuterol  (DUONEB) 0.5-2.5 (3) MG/3ML SOLN Take 3 mLs by nebulization every 4 (four) hours as needed.   L-Methylfolate 15 MG TABS Take 1 tablet (15 mg total) by mouth daily.   MAGNESIUM GLYCINATE PO Take 2 tablets by mouth daily.   metoprolol  succinate (TOPROL -XL) 25 MG 24 hr tablet TAKE ONE (1) TABLET BY MOUTH EVERY DAY   mometasone -formoterol  (DULERA) 200-5 MCG/ACT AERO Inhale 2 puffs into the lungs in the morning and at bedtime. 2 inhalations 1-2 times per day w/ spacer   montelukast  (SINGULAIR ) 10 MG tablet Take 1 tablet (10 mg total) by mouth at bedtime.   Multiple Vitamins-Minerals (ZINC PO) Take 1 tablet by mouth daily.   mupirocin  ointment (BACTROBAN ) 2 % APPLY A SMALL AMOUNT TO THE AFFECTED AREA BY TOPICAL ROUTE 3 TIMES PER DAY   norethindrone (MICRONOR) 0.35 MG tablet Take 1 tablet  by mouth daily.   nystatin  (MYCOSTATIN ) 100000 UNIT/ML suspension Use as directed 5 mLs (500,000 Units total) in the mouth or throat 4 (four) times daily.   nystatin  cream (MYCOSTATIN ) Apply 1 application topically 2 (two) times daily.   nystatin -triamcinolone  ointment (MYCOLOG) Apply 1 application topically 2 (two) times daily.   Omega 3 1000 MG CAPS Take 1 capsule by mouth daily.   omeprazole  (PRILOSEC) 40 MG capsule Take 1 capsule (40 mg total) by mouth 2 (two) times daily.   oxyCODONE -acetaminophen  (PERCOCET) 7.5-325 MG tablet Take 1 tablet by mouth 5 (five) times daily as needed for moderate pain (pain score 4-6). No More Than 5 a day. Do Not Fill Before 08/01/2024   predniSONE  (DELTASONE ) 5 MG tablet TAKE ONE TABLET BY MOUTH EVERY MORNING   Probiotic  Product (PROBIOTIC-10 PO) Take by mouth.   promethazine  (PHENERGAN ) 12.5 MG tablet Take 1 tablet (12.5 mg total) by mouth every 8 (eight) hours as needed.   Semaglutide -Weight Management (WEGOVY ) 0.5 MG/0.5ML SOAJ Inject 0.5 mg into the skin once a week.   sulfamethoxazole -trimethoprim  (BACTRIM  DS) 800-160 MG tablet Take 1 tablet by mouth 2 (two) times daily.   tamsulosin  (FLOMAX ) 0.4 MG CAPS capsule Take 1 capsule (0.4 mg total) by mouth daily after breakfast.   Tezepelumab -ekko (TEZSPIRE ) 210 MG/1. SOAJ Inject 210 mg into the skin every 28 (twenty-eight) days.   Vitamin D , Ergocalciferol , (DRISDOL ) 1.25 MG (50000 UNIT) CAPS capsule TAKE 1 CAPSULE BY MOUTH EVERY 7 DAYS   vitamin E 180 MG (400 UNITS) capsule Take 1,000 Units by mouth daily.    Facility-Administered Encounter Medications as of 07/29/2024  Medication   tezepelumab -ekko (TEZSPIRE ) 210 MG/1. syringe 210 mg    Past Surgical History:  Procedure Laterality Date   3 HOUR PH STUDY N/A 10/29/2015   Procedure: 24 HOUR PH STUDY;  Surgeon: Lupita FORBES Commander, MD;  Location: [facility];  Service: Endoscopy;  Laterality: N/A;   COLONOSCOPY     ESOPHAGEAL MANOMETRY N/A 10/29/2015    Procedure: ESOPHAGEAL MANOMETRY (EM);  Surgeon: Lupita FORBES Commander, MD;  Location: [facility];  Service: Endoscopy;  Laterality: N/A;   KNEE SURGERY  2002/2003   bil   RECTAL SURGERY  correction of prolapse   2010    Family History  Problem Relation Age of Onset   Thyroid  disease Mother    Colon polyps Father    Lung cancer Maternal Grandmother    Cancer Paternal Grandmother        colon/pancreatiec/lymphoma   Irritable bowel syndrome Brother    AAA (abdominal aortic aneurysm) Neg Hx       Controlled substance contract: n/a     Review of Systems  Constitutional:  Negative for diaphoresis.  Eyes:  Negative for pain.  Respiratory:  Negative for shortness of breath.   Cardiovascular:  Negative for chest pain, palpitations and leg swelling.  Gastrointestinal:  Negative for abdominal pain.  Endocrine: Negative for polydipsia.  Skin:  Negative for rash.  Neurological:  Negative for dizziness, weakness and headaches.  Hematological:  Does not bruise/bleed easily.  All other systems reviewed and are negative.      Objective:   Physical Exam Vitals and nursing note reviewed.  Constitutional:      General: She is not in acute distress.    Appearance: Normal appearance. She is well-developed.  HENT:     Head: Normocephalic.     Right Ear: Tympanic membrane normal.     Left Ear: Tympanic membrane normal.     Nose: Nose normal.     Mouth/Throat:     Mouth: Mucous membranes are moist.  Eyes:     Pupils: Pupils are equal, round, and reactive to light.  Neck:     Vascular: No carotid bruit or JVD.  Cardiovascular:     Rate and Rhythm: Normal rate and regular rhythm.     Heart sounds: Normal heart sounds.  Pulmonary:     Effort: Pulmonary effort is normal. No respiratory distress.     Breath sounds: Normal breath sounds. No wheezing or rales.  Chest:     Chest wall: No tenderness.  Abdominal:     General: Bowel sounds are normal. There is no distension or abdominal  bruit.     Palpations: Abdomen is soft. There is no hepatomegaly, splenomegaly,  mass or pulsatile mass.     Tenderness: There is no abdominal tenderness.  Musculoskeletal:        General: Normal range of motion.     Cervical back: Normal range of motion and neck supple.     Right lower leg: Edema (1+) present.     Left lower leg: Edema (1+) present.  Lymphadenopathy:     Cervical: No cervical adenopathy.  Skin:    General: Skin is warm and dry.  Neurological:     Mental Status: She is alert and oriented to person, place, and time.     Deep Tendon Reflexes: Reflexes are normal and symmetric.  Psychiatric:        Behavior: Behavior normal.        Thought Content: Thought content normal.        Judgment: Judgment normal.    BP 136/86   Pulse 80   Temp 98.3 F (36.8 C) (Temporal)   Ht 5' 7 (1.702 m)   Wt 207 lb (93.9 kg)   SpO2 95%   BMI 32.42 kg/m   HGBA1c 5.3%      Assessment & Plan:   Jadene Stemmer comes in today with chief complaint of medical management of chronic issues    Diagnosis and orders addressed:  1. Mixed hyperlipidemia Low fat diet - atorvastatin  (LIPITOR) 40 MG tablet; TAKE ONE (1) TABLET BY MOUTH EVERY DAY  Dispense: 90 tablet; Refill: 1  2. Gastroesophageal reflux disease without esophagitis Avoid spicy foods Do not eat 2 hours prior to bedtime   3. Esophageal dysphagia Chew food well  4. Irritable bowel syndrome with constipation Increase fiber in diet - amitriptyline  (ELAVIL ) 75 MG tablet; Take 1 tablet (75 mg total) by mouth at bedtime.  Dispense: 90 tablet; Refill: 1 - baclofen  (LIORESAL ) 20 MG tablet; TAKE ONE TABLET FOUR TIMES DAILY  Dispense: 360 each; Refill: 1  5. Tachycardia, paroxysmal (HCC) Avoid caffeine - metoprolol  succinate (TOPROL -XL) 25 MG 24 hr tablet; Take 1 tablet (25 mg total) by mouth daily.  Dispense: 90 tablet; Refill: 1  6. Recurrent major depressive disorder, in full remission [facility]) Stress  management  7. Fibromyalgia Keep follow up pain management  8. Inflammatory autoimmune disorder (HCC) Keep follow up with neurology - gabapentin  (NEURONTIN ) 600 MG tablet; TAKE 1 TABLET BY MOUTH 3 TIMES DAILY & TAKE 2 TABLETS AT BEDTIME  Dispense: 450 tablet; Refill: 1 - predniSONE  (DELTASONE ) 5 MG tablet; Take 1 tablet (5 mg total) by mouth every morning.  Dispense: 90 tablet; Refill: 1  9. Systemic lupus erythematosus, unspecified SLE type, unspecified organ involvement status (HCC) Keep follow up with specialist  10. Seizures (HCC) Report ant seizure activity - acetaZOLAMIDE  (DIAMOX ) 250 MG tablet; TAKE 1 TABLET EVERY MORNING, TAKE 1 TABLET AT 2PM, AND TAKE 2 TABLETSAT BEDTIME  Dispense: 360 tablet; Refill: 1  11. Urinary frequency Force fluids - tamsulosin  (FLOMAX ) 0.4 MG CAPS capsule; Take 1 capsule (0.4 mg total) by mouth daily after breakfast.  Dispense: 90 capsule; Refill: 1  12. Vitamin D  deficiency Continue vitamin d  supplement  13. BMI 33.0-33.9,adult Discussed diet and exercise for person with BMI >25 Will recheck weight in 3-6 months   14. Peripheral edema Elevate legs when sitting - furosemide  (LASIX ) 20 MG tablet; Take 1 tablet (20 mg total) by mouth daily.  Dispense: 90 tablet; Refill: 1   Labs pending Health Maintenance reviewed Diet and exercise encouraged  Follow up plan: 6 months   Mary-Margaret Gladis,  FNP

## 2024-07-30 LAB — CBC WITH DIFFERENTIAL/PLATELET
Basophils Absolute: 0.1 x10E3/uL (ref 0.0–0.2)
Basos: 1 %
EOS (ABSOLUTE): 0.1 x10E3/uL (ref 0.0–0.4)
Eos: 1 %
Hematocrit: 37.7 % (ref 34.0–46.6)
Hemoglobin: 12.1 g/dL (ref 11.1–15.9)
Immature Grans (Abs): 0 x10E3/uL (ref 0.0–0.1)
Immature Granulocytes: 0 %
Lymphocytes Absolute: 2.8 x10E3/uL (ref 0.7–3.1)
Lymphs: 30 %
MCH: 28.7 pg (ref 26.6–33.0)
MCHC: 32.1 g/dL (ref 31.5–35.7)
MCV: 90 fL (ref 79–97)
Monocytes Absolute: 0.7 x10E3/uL (ref 0.1–0.9)
Monocytes: 8 %
Neutrophils Absolute: 5.6 x10E3/uL (ref 1.4–7.0)
Neutrophils: 60 %
Platelets: 270 x10E3/uL (ref 150–450)
RBC: 4.21 x10E6/uL (ref 3.77–5.28)
RDW: 14.2 % (ref 11.7–15.4)
WBC: 9.4 x10E3/uL (ref 3.4–10.8)

## 2024-07-30 LAB — CMP14+EGFR
ALT: 12 IU/L (ref 0–32)
AST: 11 IU/L (ref 0–40)
Albumin: 4.1 g/dL (ref 3.9–4.9)
Alkaline Phosphatase: 73 IU/L (ref 44–121)
BUN/Creatinine Ratio: 11 (ref 9–23)
BUN: 8 mg/dL (ref 6–24)
Bilirubin Total: 0.3 mg/dL (ref 0.0–1.2)
CO2: 19 mmol/L — AB (ref 20–29)
Calcium: 8.8 mg/dL (ref 8.7–10.2)
Chloride: 110 mmol/L — AB (ref 96–106)
Creatinine, Ser: 0.73 mg/dL (ref 0.57–1.00)
Globulin, Total: 2.3 g/dL (ref 1.5–4.5)
Glucose: 125 mg/dL — AB (ref 70–99)
Potassium: 3.2 mmol/L — AB (ref 3.5–5.2)
Sodium: 143 mmol/L (ref 134–144)
Total Protein: 6.4 g/dL (ref 6.0–8.5)
eGFR: 107 mL/min/1.73 (ref >59)

## 2024-07-30 LAB — LIPID PANEL
Cholesterol, Total: 142 mg/dL (ref 100–199)
HDL: 44 mg/dL (ref >39)
LDL CALC COMMENT:: 3.2 ratio (ref 0.0–4.4)
LDL Chol Calc (NIH): 69 mg/dL (ref 0–99)
Triglycerides: 174 mg/dL — AB (ref 0–149)
VLDL Cholesterol Cal: 29 mg/dL (ref 5–40)

## 2024-08-01 ENCOUNTER — Ambulatory Visit: Payer: Self-pay | Admitting: Nurse Practitioner

## 2024-08-02 ENCOUNTER — Ambulatory Visit: Admitting: Family

## 2024-08-11 ENCOUNTER — Other Ambulatory Visit: Payer: Self-pay | Admitting: Nurse Practitioner

## 2024-08-11 DIAGNOSIS — R350 Frequency of micturition: Secondary | ICD-10-CM

## 2024-08-12 NOTE — Telephone Encounter (Unsigned)
 Copied from CRM [id number]. Topic: Clinical - Prescription Issue >> Aug 12, 2024 12:16 PM DeAngela L wrote: Reason for CRM: Patient calling cause she received a letter from her insurance provider the WEGOVY  was denied and she got help in the past from [place] in the office. M[facility]iss helped the patient get a Prior Auth for this medication and she would like to ask if the office could help her get another Prior Auth now after the patient received letter dated 08/05/24 from her insurance provider, the paper has appeal information on it and she is not sure if she needs to drop this letter off at the office or schedule a telehealth visit  semaglutide -weight management (WEGOVY ) .25 mg denied on the letter   Pt num [phone] (M) ok to leave a detailed message

## 2024-08-18 ENCOUNTER — Other Ambulatory Visit: Payer: Self-pay

## 2024-08-19 ENCOUNTER — Telehealth: Payer: Self-pay | Admitting: Family Medicine

## 2024-08-19 NOTE — Telephone Encounter (Signed)
 As of August 17, 2024, [place]  Medicaid will no longer cover Wegovy  specifically for weight management due to state funding changes. However, prior authorization (PA) may still be granted for other FDA-approved diagnoses.   Covered diagnoses for Wegovy  (effective Oct. 1, 2025) For these conditions, coverage will be managed through a PA process.   Reducing the risk of major adverse cardiovascular events in adults with existing cardiovascular disease who are also obese or overweight. 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 Severe obstructive sleep apnea. Treatment of diabetes  Please advise.

## 2024-08-19 NOTE — Telephone Encounter (Signed)
 Copied from CRM #[id number]. Topic: Clinical - Medical Advice >> Aug 19, 2024  4:01 PM Myrick T wrote: Reason for CRM: patient called stated she spoke with the pharmacist and was told if the request Wegovy  was sent in for a different diagnosis it could possible be a covered med for her. Patient is asking for a call back.

## 2024-08-21 ENCOUNTER — Encounter (INDEPENDENT_AMBULATORY_CARE_PROVIDER_SITE_OTHER): Payer: Self-pay

## 2024-08-22 ENCOUNTER — Other Ambulatory Visit: Payer: Self-pay

## 2024-08-22 DIAGNOSIS — M79671 Pain in right foot: Secondary | ICD-10-CM | POA: Diagnosis not present

## 2024-08-22 NOTE — Progress Notes (Signed)
 Specialty Pharmacy Refill Coordination Note  Autumn Davis is a 38 y.o. female contacted today regarding refills of specialty medication(s) Tezepelumab -ekko (TEZSPIRE )   Patient requested (Patient-Rptd) Delivery   Delivery date: 08/24/24   Verified address: (Patient-Rptd) [address] [place], [place] [address]   Medication will be filled on 08/23/24.

## 2024-08-22 NOTE — Telephone Encounter (Signed)
 Please let patient that she has no dx that they will approve for her to have wegovy .

## 2024-08-23 ENCOUNTER — Other Ambulatory Visit: Payer: Self-pay

## 2024-08-23 NOTE — Telephone Encounter (Signed)
 Patient aware and verbalized understanding.

## 2024-08-29 ENCOUNTER — Encounter: Payer: Self-pay | Admitting: Registered Nurse

## 2024-08-29 ENCOUNTER — Encounter: Attending: Registered Nurse | Admitting: Registered Nurse

## 2024-08-29 VITALS — BP 120/83 | HR 80 | Ht 67.0 in | Wt 199.0 lb

## 2024-08-29 DIAGNOSIS — M546 Pain in thoracic spine: Secondary | ICD-10-CM | POA: Insufficient documentation

## 2024-08-29 DIAGNOSIS — Z79891 Long term (current) use of opiate analgesic: Secondary | ICD-10-CM | POA: Insufficient documentation

## 2024-08-29 DIAGNOSIS — Z5181 Encounter for therapeutic drug level monitoring: Secondary | ICD-10-CM | POA: Insufficient documentation

## 2024-08-29 DIAGNOSIS — M2550 Pain in unspecified joint: Secondary | ICD-10-CM | POA: Insufficient documentation

## 2024-08-29 DIAGNOSIS — G609 Hereditary and idiopathic neuropathy, unspecified: Secondary | ICD-10-CM | POA: Diagnosis not present

## 2024-08-29 DIAGNOSIS — G894 Chronic pain syndrome: Secondary | ICD-10-CM | POA: Diagnosis not present

## 2024-08-29 DIAGNOSIS — M797 Fibromyalgia: Secondary | ICD-10-CM | POA: Diagnosis not present

## 2024-08-29 DIAGNOSIS — G8929 Other chronic pain: Secondary | ICD-10-CM | POA: Insufficient documentation

## 2024-08-29 MED ORDER — OXYCODONE-ACETAMINOPHEN 7.5-325 MG PO TABS: 1.0000 | ORAL_TABLET | Freq: Every day | ORAL | 0 refills | Status: DC | PRN

## 2024-08-29 NOTE — Progress Notes (Signed)
 Subjective:    Patient ID: Autumn Davis, female    DOB: 01/03/1986, 38 y.o.   MRN: [id number]  HPI: Autumn Davis is a 38 y.o. female who returns for follow up appointment for chronic pain and medication refill. She states her pain is located in her upper- mid back, bilateral hips and generalized joint pain. Also reports tingling and burning in her bilateral feet occasionally, compliant with medication. She rates her pain 8. Her current exercise regime is walking and performing stretching exercises.  [name]  equivalent is 56.25 MME.   Oral Swab was Performed today.     Pain Inventory Average Pain 7 Pain Right Now 8 My pain is constant, sharp, burning, stabbing, tingling, and aching  In the last 24 hours, has pain interfered with the following? General activity 6 Relation with others 4 Enjoyment of life 4 What TIME of day is your pain at its worst? evening and night Sleep (in general) Poor  Pain is worse with: walking, bending, sitting, inactivity, standing, and some activites Pain improves with: rest, heat/ice, therapy/exercise, pacing activities, medication, and TENS Relief from Meds: 8  Family History  Problem Relation Age of Onset   Thyroid  disease Mother    Colon polyps Father    Lung cancer Maternal Grandmother    Cancer Paternal Grandmother        colon/pancreatiec/lymphoma   Irritable bowel syndrome Brother    AAA (abdominal aortic aneurysm) Neg Hx    Social History   Socioeconomic History   Marital status: Legally Separated    Spouse name: Not on file   Number of children: 1   Years of education: Not on file   Highest education level: Not on file  Occupational History   Occupation: disabled    Employer: [employer]  Tobacco Use   Smoking status: Never   Smokeless tobacco: Never  Vaping Use   Vaping status: Never Used  Substance and Sexual Activity   Alcohol  use: No    Alcohol /week: 0.0 standard drinks of  alcohol    Drug use: No   Sexual activity: Not Currently  Other Topics Concern   Not on file  Social History Narrative   She lives alone with her 36 year old son - right now - they are living with her parents right now   Social Drivers of Corporate investment banker Strain: Low Risk  (05/05/2023)   Overall Financial Resource Strain (CARDIA)    Difficulty of Paying Living Expenses: Not hard at all  Food Insecurity: No Food Insecurity (05/05/2023)   Hunger Vital Sign    Worried About Running Out of Food in the Last Year: Never true    Ran Out of Food in the Last Year: Never true  Transportation Needs: No Transportation Needs (05/05/2023)   PRAPARE - Administrator, Civil Service (Medical): No    Lack of Transportation (Non-Medical): No  Physical Activity: Insufficiently Active (05/05/2023)   Exercise Vital Sign    Days of Exercise per Week: 2 days    Minutes of Exercise per Session: 30 min  Stress: No Stress Concern Present (05/05/2023)   [employer] of Occupational Health - Occupational Stress Questionnaire    Feeling of Stress : Not at all  Social Connections: Moderately Isolated (05/05/2023)   Social Connection and Isolation Panel    Frequency of Communication with Friends and Family: More than three times a week    Frequency of Social Gatherings with Friends  and Family: More than three times a week    Attends Religious Services: More than 4 times per year    Active Member of Clubs or Organizations: No    Attends Banker Meetings: Never    Marital Status: Separated   Past Surgical History:  Procedure Laterality Date   24 HOUR PH STUDY N/A 10/29/2015   Procedure: 24 HOUR PH STUDY;  Surgeon: Lupita FORBES Commander, MD;  Location: [facility];  Service: Endoscopy;  Laterality: N/A;   COLONOSCOPY     ESOPHAGEAL MANOMETRY N/A 10/29/2015   Procedure: ESOPHAGEAL MANOMETRY (EM);  Surgeon: Lupita FORBES Commander, MD;  Location: [facility];  Service: Endoscopy;  Laterality:  N/A;   KNEE SURGERY  2002/2003   bil   RECTAL SURGERY  correction of prolapse   2010   Past Surgical History:  Procedure Laterality Date   51 HOUR PH STUDY N/A 10/29/2015   Procedure: 24 HOUR PH STUDY;  Surgeon: Lupita FORBES Commander, MD;  Location: [facility];  Service: Endoscopy;  Laterality: N/A;   COLONOSCOPY     ESOPHAGEAL MANOMETRY N/A 10/29/2015   Procedure: ESOPHAGEAL MANOMETRY (EM);  Surgeon: Lupita FORBES Commander, MD;  Location: [facility];  Service: Endoscopy;  Laterality: N/A;   KNEE SURGERY  2002/2003   bil   RECTAL SURGERY  correction of prolapse   2010   Past Medical History:  Diagnosis Date   Allergy    Arthritis    HANDS,HIPS,KNEES   Asthma    Bronchitis, chronic/intermittent 01/22/2012   Depression 01/22/2012   GERD (gastroesophageal reflux disease)    Hyperlipidemia    IBS (irritable bowel syndrome) 01/22/2012   Lupus    Neuromuscular disorder (HCC)    Osteoporosis    Seizures (HCC)    SOB (shortness of breath)    Thyroid  disease    Ht 5' 7 (1.702 m)   Wt 199 lb (90.3 kg)   BMI 31.17 kg/m   Opioid Risk Score:   Fall Risk Score:  `1  Depression screen [facility] 2/9     07/29/2024   11:49 AM 06/27/2024   10:50 AM 05/06/2024    1:23 PM 04/25/2024   12:33 PM 12/21/2023   10:21 AM 09/11/2023   10:34 AM 06/02/2023    3:43 PM  Depression screen PHQ 2/9  Decreased Interest 1 0 0 1 2 0 0  Down, Depressed, Hopeless 0 0 0 0 0 0 0  PHQ - 2 Score 1 0 0 1 2 0 0  Altered sleeping 2    2  3   Tired, decreased energy 1    2  1   Change in appetite 0    0  1  Feeling bad or failure about yourself  0    0  0  Trouble concentrating 0    0  0  Moving slowly or fidgety/restless 0    0  0  Suicidal thoughts 0    0  0  PHQ-9 Score 4    6  5   Difficult doing work/chores Somewhat difficult    Somewhat difficult  Somewhat difficult     Review of Systems  Musculoskeletal:  Positive for back pain.       Pain in b/l lower legs  All other systems reviewed and are negative.       Objective:   Physical Exam Vitals and nursing note reviewed.  Constitutional:      Appearance: Normal appearance.  Cardiovascular:     Rate and Rhythm: Normal  rate and regular rhythm.     Pulses: Normal pulses.     Heart sounds: Normal heart sounds.  Pulmonary:     Effort: Pulmonary effort is normal.     Breath sounds: Normal breath sounds.  Musculoskeletal:     Comments: Normal Muscle Bulk and Muscle Testing Reveals:  Upper Extremities: Full ROM and Muscle Strength 5/5  Thoracic Paraspinal Tenderness: T-1-T-4  T-7-T-10 Bilateral Greater Trochanter Tenderness Lower Extremities: Full ROM and  Muscle Strength 5/5 Arises from Table Slowly Narrow Based  Gait     Skin:    General: Skin is warm and dry.  Neurological:     Mental Status: She is alert and oriented to person, place, and time.  Psychiatric:        Mood and Affect: Mood normal.        Behavior: Behavior normal.          Assessment & Plan:  . Chronic seizure disorder: No seizure's. Continue current medication regimen with  Gabapentin . Neurology Following. 08/29/2024. 2. Chronic muscle spasms, weakness with associated pain disorder: Continue current medication regimen with Baclofen . Continue with Exercise regime. 08/29/2024. 3. Chronic dysphagia: No complaints today. Continue to monitor. GI Following. 08/29/2024. 4. Anxiety with depression : No complaints today. Stable. Continue current medication regimen with  Elavil . 08/29/2024. 5. Fibromyalgia/ Rib Pain/Chronic Pain: Continue Diclofenac : Continue with exercise and heat Therapy. 08/29/2024. Refilled:  oxyCODONE  7.5/325mg  one tablet 5 times  daily as needed  #135. Second script sent for the following month. Continue with slow weaning.   We will continue the opioid monitoring program, this consists of regular clinic visits, examinations, urine drug screen, pill counts as well as use of [place]  Controlled Substance Reporting system. A 12 month History has been  reviewed on the [place]  Controlled Substance Reporting System 06/27/2024.  6. Peripheral Neuropathy: Continue current medication regimen with  Gabapentin : 08/29/2024. 7. Polyarthralgia: Continue HEP as Tolerated. Continue to Monitor.  08/29/2024  8. Bilateral Chronic Knee Pain: No complaints today. Continue HEP as Tolerated. Continue to Monitor. 08/29/2024 9. Chronic Bilateral Thoracic Back Pain: Continue HEP as tolerated. Continue to monitor.    F/U in 2 months

## 2024-09-01 ENCOUNTER — Ambulatory Visit (INDEPENDENT_AMBULATORY_CARE_PROVIDER_SITE_OTHER)

## 2024-09-01 ENCOUNTER — Ambulatory Visit: Payer: Self-pay | Admitting: Nurse Practitioner

## 2024-09-01 ENCOUNTER — Ambulatory Visit: Payer: Self-pay | Admitting: *Deleted

## 2024-09-01 ENCOUNTER — Ambulatory Visit: Admitting: Nurse Practitioner

## 2024-09-01 ENCOUNTER — Encounter: Payer: Self-pay | Admitting: Nurse Practitioner

## 2024-09-01 VITALS — BP 102/69 | HR 82 | Temp 98.2°F | Ht 67.0 in | Wt 196.0 lb

## 2024-09-01 DIAGNOSIS — M79671 Pain in right foot: Secondary | ICD-10-CM

## 2024-09-01 DIAGNOSIS — S92344A Nondisplaced fracture of fourth metatarsal bone, right foot, initial encounter for closed fracture: Secondary | ICD-10-CM | POA: Diagnosis not present

## 2024-09-01 DIAGNOSIS — S99921A Unspecified injury of right foot, initial encounter: Secondary | ICD-10-CM

## 2024-09-01 NOTE — Telephone Encounter (Signed)
 FYI Only or Action Required?: FYI only for provider.  Patient was last seen in primary care on 07/29/2024 by Gladis Mustard, FNP.  Called Nurse Triage reporting Foot Swelling.  Symptoms began yesterday.  Interventions attempted: Ice/heat application.  Symptoms are: unchanged.  Triage Disposition: See HCP Within 4 Hours (Or PCP Triage)  Patient/caregiver understands and will follow disposition?: Yes   Reason for Disposition  [1] SEVERE pain (e.g., excruciating) AND [2] not improved 2 hours after pain medicine/ice packs  Answer Assessment - Initial Assessment Questions 1. MECHANISM: How did the injury happen? (e.g., twisting injury, direct blow)      Patient stepped/ walking- heard crunch 2. ONSET: When did the injury happen? (e.g., minutes or hours ago)      Last night 3. LOCATION: Where is the injury located?      R foot- 4-5 toe to outer side 4. APPEARANCE of INJURY: What does the injury look like?      Slight swelling 5. WEIGHT-BEARING: Can you put weight on that foot? Can you walk (four steps or more)?       No- severe pain 6. SIZE: For cuts, bruises, or swelling, ask: How large is it? (e.g., inches or centimeters;  entire joint)      Some redness 7. PAIN: Is there pain? If Yes, ask: How bad is the pain? What does it keep you from doing? (Scale 0-10; or none, mild, moderate, severe)     Sitting- 5/10, standing 9/10  9. OTHER SYMPTOMS: Do you have any other symptoms?      nausea  Protocols used: Foot Injury-A-AH   Copied from CRM [id number]. Topic: Clinical - Red Word Triage >> Sep 01, 2024  8:33 AM Tonda B wrote: [facility] that prompted transfer to Nurse Triage: pt  calling in with swelling in her right foot

## 2024-09-01 NOTE — Progress Notes (Signed)
 Subjective:  Patient ID: [name], female    DOB: [birth year], 38 y.o.   MRN: [id number]  Patient Care Team: Gladis Mustard, FNP as PCP - General (Nurse Practitioner) Kristie Lamprey, MD as Attending Physician (Gastroenterology) Dozier Oneil BROCKS, MD as Referring Physician (Neurology) Shellia Oh, MD as Consulting Physician (Pulmonary Disease) Marget Lenis, MD as Consulting Physician (Obstetrics and Gynecology)   Chief Complaint:  Foot Injury (Right foot injury last night, heard a crunch noise, pain and difficulty walking )   HPI: Mahi Zabriskie is a 38 y.o. female presenting on 09/01/2024 for Foot Injury (Right foot injury last night, heard a crunch noise, pain and difficulty walking )   Discussed the use of AI scribe software for clinical note transcription with the patient, who gave verbal consent to proceed.  History of Present Illness Juanell Saffo is a 38 year old female with osteoporosis who presents with right foot pain.  She experienced a 'crunch' sound in her right foot while walking during a business meeting, followed by excruciating pain localized around the second and third metatarsals, extending to the arch but not to the ankle. The pain is rated 4-5/10 when sitting and becomes excruciating when standing or walking, causing her to favor the foot significantly. No recent falls related to this incident.  She has a history of osteoporosis and has been on treatment with Forteo in the past and currently receives yearly infusions, though she cannot recall the specific medication. She is currently taking Percocet or oxycodone  with Tylenol , 7.5 mg, four to five times a day for pain management related to her autoimmune issues.  She has a history of plantar fasciitis, for which she received injections and used a boot. She is uncertain if the boot she has is suitable for her current right foot issue, as it was previously used for her left foot.  She  reports irregular menstrual cycles, previously occurring every 20 days and now extending to 80-100 days. She takes norethindrone for PCOS and endometriosis, which affects her cycle regularity.  She experiences frequent falls, though she did not fall during the recent foot injury. She has a history of kidney stones, which she manages with a high pain tolerance, as noted by her family history of kidney stones.  She has been disabled since 2012 but returned to work in February, working in [employer]. She has recently lost 44 pounds through medication and effort.      Relevant past medical, surgical, family, and social history reviewed and updated as indicated.  Allergies and medications reviewed and updated. Data reviewed: Chart in Epic.   Past Medical History:  Diagnosis Date   Allergy    Arthritis    HANDS,HIPS,KNEES   Asthma    Bronchitis, chronic/intermittent 01/22/2012   Depression 01/22/2012   GERD (gastroesophageal reflux disease)    Hyperlipidemia    IBS (irritable bowel syndrome) 01/22/2012   Lupus    Neuromuscular disorder (HCC)    Osteoporosis    Seizures (HCC)    SOB (shortness of breath)    Thyroid  disease     Past Surgical History:  Procedure Laterality Date   80 HOUR PH STUDY N/A 10/29/2015   Procedure: 24 HOUR PH STUDY;  Surgeon: Lupita FORBES Commander, MD;  Location: [facility];  Service: Endoscopy;  Laterality: N/A;   COLONOSCOPY     ESOPHAGEAL MANOMETRY N/A 10/29/2015   Procedure: ESOPHAGEAL MANOMETRY (EM);  Surgeon: Lupita FORBES Commander, MD;  Location: [facility];  Service: Endoscopy;  Laterality: N/A;   KNEE SURGERY  2002/2003   bil   RECTAL SURGERY  correction of prolapse   2010    Social History   Socioeconomic History   Marital status: Legally Separated    Spouse name: Not on file   Number of children: 1   Years of education: Not on file   Highest education level: Not on file  Occupational History   Occupation: disabled    Employer: [employer]  Tobacco Use   Smoking status: Never   Smokeless tobacco: Never  Vaping Use   Vaping status: Never Used  Substance and Sexual Activity   Alcohol  use: No    Alcohol /week: 0.0 standard drinks of alcohol    Drug use: No   Sexual activity: Not Currently  Other Topics Concern   Not on file  Social History Narrative   She lives alone with her 51 year old son - right now - they are living with her parents right now   Social Drivers of Corporate investment banker Strain: Low Risk  (05/05/2023)   Overall Financial Resource Strain (CARDIA)    Difficulty of Paying Living Expenses: Not hard at all  Food Insecurity: No Food Insecurity (05/05/2023)   Hunger Vital Sign    Worried About Running Out of Food in the Last Year: Never true    Ran Out of Food in the Last Year: Never true  Transportation Needs: No Transportation Needs (05/05/2023)   PRAPARE - Administrator, Civil Service (Medical): No    Lack of Transportation (Non-Medical): No  Physical Activity: Insufficiently Active (05/05/2023)   Exercise Vital Sign    Days of Exercise per Week: 2 days    Minutes of Exercise per Session: 30 min  Stress: No Stress Concern Present (05/05/2023)   [employer] of Occupational Health - Occupational Stress Questionnaire    Feeling of Stress : Not at all  Social Connections: Moderately Isolated (05/05/2023)   Social Connection and Isolation Panel    Frequency of Communication with Friends and Family: More than three times a week    Frequency of Social Gatherings with Friends and Family: More than three times a week    Attends Religious Services: More than 4 times per year    Active Member of [employer] or Organizations: No    Attends Banker Meetings: Never    Marital Status: Separated  Intimate Partner Violence: Not At Risk (05/05/2023)   Humiliation, Afraid, Rape, and Kick questionnaire    Fear of Current or Ex-Partner: No    Emotionally Abused: No    Physically  Abused: No    Sexually Abused: No    Outpatient Encounter Medications as of 09/01/2024  Medication Sig   acetaZOLAMIDE  (DIAMOX ) 250 MG tablet TAKE 1 TABLET EVERY MORNING, TAKE 1 TABLET AT 2PM, AND TAKE 2 TABLETSAT BEDTIME   albuterol  (VENTOLIN  HFA) 108 (90 Base) MCG/ACT inhaler Inhale 2 puffs into the lungs every 4 (four) hours as needed for wheezing or shortness of breath.   amitriptyline  (ELAVIL ) 75 MG tablet Take 1 tablet (75 mg total) by mouth at bedtime.   Ascorbic Acid (VITAMIN C ADULT GUMMIES PO) Take 2 each by mouth daily.   atorvastatin  (LIPITOR) 40 MG tablet Take 1 tablet (40 mg total) by mouth daily.   baclofen  (LIORESAL ) 20 MG tablet TAKE ONE (1) TABLET BY MOUTH FOUR (4) TIMES DAILY   budesonide  (RHINOCORT  AQUA) 32 MCG/ACT nasal spray Place 2 sprays  into both nostrils every morning.   Calcium -Magnesium-Vitamin D  (CALCIUM  1200+D3 PO) Take 1 tablet by mouth daily.   cetirizine  (ZYRTEC ) 10 MG tablet TAKE 1 TABLET BY MOUTH TWICE DAILY AS NEEDED FOR ALLERGIES   cromolyn (OPTICROM) 4 % ophthalmic solution SMARTSIG:In Eye(s)   cyanocobalamin  (VITAMIN B12) 1000 MCG/ML injection INJECT 1ML IM EVERY 30 DAYS   cycloSPORINE (RESTASIS) 0.05 % ophthalmic emulsion Place 1 drop into both eyes 2 (two) times daily.   diclofenac  (VOLTAREN ) 75 MG EC tablet TAKE ONE TABLET BY MOUTH TWICE DAILY   EPINEPHRINE  0.3 mg/0.3 mL IJ SOAJ injection INJECT 0.3MG  INTO THE MUSCLE AS NEEDED FOR ANAPHYLAXIS   EPINEPHRINE  BASE IN    famotidine  (PEPCID ) 40 MG tablet Take 1 tablet (40 mg total) by mouth at bedtime.   furosemide  (LASIX ) 20 MG tablet Take 1 tablet (20 mg total) by mouth daily.   gabapentin  (NEURONTIN ) 600 MG tablet TAKE 1 TABLET BY MOUTH 3 TIMES DAILY & TAKE 2 TABLETS AT BEDTIME   ipratropium-albuterol  (DUONEB) 0.5-2.5 (3) MG/3ML SOLN Take 3 mLs by nebulization every 4 (four) hours as needed.   L-Methylfolate 15 MG TABS Take 1 tablet (15 mg total) by mouth daily.   MAGNESIUM GLYCINATE PO Take 2 tablets  by mouth daily.   metoprolol  succinate (TOPROL -XL) 25 MG 24 hr tablet Take 1 tablet (25 mg total) by mouth daily.   mometasone -formoterol  (DULERA) 200-5 MCG/ACT AERO Inhale 2 puffs into the lungs in the morning and at bedtime. 2 inhalations 1-2 times per day w/ spacer   montelukast  (SINGULAIR ) 10 MG tablet Take 1 tablet (10 mg total) by mouth at bedtime.   Multiple Vitamins-Minerals (ZINC PO) Take 1 tablet by mouth daily.   mupirocin  ointment (BACTROBAN ) 2 % APPLY A SMALL AMOUNT TO THE AFFECTED AREA BY TOPICAL ROUTE 3 TIMES PER DAY   norethindrone (MICRONOR) 0.35 MG tablet Take 1 tablet by mouth daily.   nystatin  (MYCOSTATIN ) 100000 UNIT/ML suspension Use as directed 5 mLs (500,000 Units total) in the mouth or throat 4 (four) times daily.   nystatin  cream (MYCOSTATIN ) Apply 1 application topically 2 (two) times daily.   nystatin -triamcinolone  ointment (MYCOLOG) Apply 1 application topically 2 (two) times daily.   Omega 3 1000 MG CAPS Take 1 capsule by mouth daily.   omeprazole  (PRILOSEC) 40 MG capsule Take 1 capsule (40 mg total) by mouth 2 (two) times daily.   oxyCODONE -acetaminophen  (PERCOCET) 7.5-325 MG tablet Take 1 tablet by mouth 5 (five) times daily as needed for moderate pain (pain score 4-6). No More Than 5 a day. Do Not Fill Before 10/05/2024   predniSONE  (DELTASONE ) 5 MG tablet Take 1 tablet (5 mg total) by mouth every morning.   Probiotic Product (PROBIOTIC-10 PO) Take by mouth.   promethazine  (PHENERGAN ) 12.5 MG tablet Take 1 tablet (12.5 mg total) by mouth every 8 (eight) hours as needed.   semaglutide -weight management (WEGOVY ) 1 MG/0.5ML SOAJ SQ injection Inject 1 mg into the skin once a week.   sulfamethoxazole -trimethoprim  (BACTRIM  DS) 800-160 MG tablet Take 1 tablet by mouth 2 (two) times daily.   tamsulosin  (FLOMAX ) 0.4 MG CAPS capsule TAKE 1 CAPSULE BY MOUTH EVERY MORNING AFTER BREAKFAST   Tezepelumab -ekko (TEZSPIRE ) 210 MG/1. SOAJ Inject 210 mg into the skin every 28  (twenty-eight) days.   Vitamin D , Ergocalciferol , (DRISDOL ) 1.25 MG (50000 UNIT) CAPS capsule TAKE 1 CAPSULE BY MOUTH EVERY 7 DAYS   vitamin E 180 MG (400 UNITS) capsule Take 1,000 Units by mouth daily.  Facility-Administered Encounter Medications as of 09/01/2024  Medication   tezepelumab -ekko (TEZSPIRE ) 210 MG/1. syringe 210 mg    Allergies  Allergen Reactions   Alfuzosin     Possible allergic reaction in 2024; does fine with Flomax  (Tamsulosin )   Ciprofloxacin  Other (See Comments)    Muscle weakness and numbness  Other reaction(s): Not available  Muscle weakness and numbness Other reaction(s): Not available  Other Reaction(s): Other (See Comments)  Muscle weakness and numbness Other reaction(s): Not available    Muscle weakness and numbness  Other reaction(s): Not available  Muscle weakness and numbness Other reaction(s): Not available   Compazine Other (See Comments)    hallucinations   Prochlorperazine Other (See Comments) and Rash    Pt states it makes her feel like things are crawling on her Other reaction(s): Not available   Zofran  [Ondansetron ] Other (See Comments)    Reports reaction; can only tolerate phenergan  tremors    Pertinent ROS per HPI, otherwise unremarkable      Objective:  BP 102/69   Pulse 82   Temp 98.2 F (36.8 C) (Temporal)   Ht 5' 7 (1.702 m)   Wt 196 lb (88.9 kg)   SpO2 97%   BMI 30.70 kg/m    Wt Readings from Last 3 Encounters:  09/01/24 196 lb (88.9 kg)  08/29/24 199 lb (90.3 kg)  07/29/24 207 lb (93.9 kg)    Physical Exam Vitals and nursing note reviewed.  Constitutional:      General: She is not in acute distress. HENT:     Head: Normocephalic and atraumatic.     Nose: Nose normal.     Mouth/Throat:     Mouth: Mucous membranes are moist.  Eyes:     General: No scleral icterus.    Extraocular Movements: Extraocular movements intact.     Conjunctiva/sclera: Conjunctivae normal.     Pupils: Pupils are equal,  round, and reactive to light.  Cardiovascular:     Heart sounds: Normal heart sounds.  Pulmonary:     Effort: Pulmonary effort is normal.     Breath sounds: Normal breath sounds.  Musculoskeletal:     Right foot: Normal range of motion. Swelling and tenderness present. No crepitus. Normal pulse.     Left foot: Normal.  Skin:    General: Skin is warm and dry.     Findings: No rash.  Neurological:     Mental Status: She is alert.    Physical Exam MUSCULOSKELETAL: Mild pain on flexion and extension of the right foot. Pain on inversion of the right foot. Tenderness on palpation of the right foot.     Results for orders placed or performed in visit on 07/29/24  Bayer DCA Hb A1c Waived   Collection Time: 07/29/24 11:49 AM  Result Value Ref Range   HB A1C (BAYER DCA - WAIVED) 5.3 4.8 - 5.6 %  CBC with Differential/Platelet   Collection Time: 07/29/24 11:54 AM  Result Value Ref Range   WBC 9.4 3.4 - 10.8 x10E3/uL   RBC 4.21 3.77 - 5.28 x10E6/uL   Hemoglobin 12.1 11.1 - 15.9 g/dL   Hematocrit 62.2 65.9 - 46.6 %   MCV 90 79 - 97 fL   MCH 28.7 26.6 - 33.0 pg   MCHC 32.1 31.5 - 35.7 g/dL   RDW 85.7 88.2 - 84.5 %   Platelets 270 150 - 450 x10E3/uL   Neutrophils 60 Not Estab. %   Lymphs 30 Not Estab. %   Monocytes 8 Not  Estab. %   Eos 1 Not Estab. %   Basos 1 Not Estab. %   Neutrophils Absolute 5.6 1.4 - 7.0 x10E3/uL   Lymphocytes Absolute 2.8 0.7 - 3.1 x10E3/uL   Monocytes Absolute 0.7 0.1 - 0.9 x10E3/uL   EOS (ABSOLUTE) 0.1 0.0 - 0.4 x10E3/uL   Basophils Absolute 0.1 0.0 - 0.2 x10E3/uL   Immature Granulocytes 0 Not Estab. %   Immature Grans (Abs) 0.0 0.0 - 0.1 x10E3/uL  CMP14+EGFR   Collection Time: 07/29/24 11:54 AM  Result Value Ref Range   Glucose 125 (H) 70 - 99 mg/dL   BUN 8 6 - 24 mg/dL   Creatinine, Ser 9.26 0.57 - 1.00 mg/dL   eGFR 892 >40 fO/fpw/8.26   BUN/Creatinine Ratio 11 9 - 23   Sodium 143 134 - 144 mmol/L   Potassium 3.2 (L) 3.5 - 5.2 mmol/L   Chloride  110 (H) 96 - 106 mmol/L   CO2 19 (L) 20 - 29 mmol/L   Calcium  8.8 8.7 - 10.2 mg/dL   Total Protein 6.4 6.0 - 8.5 g/dL   Albumin 4.1 3.9 - 4.9 g/dL   Globulin, Total 2.3 1.5 - 4.5 g/dL   Bilirubin Total 0.3 0.0 - 1.2 mg/dL   Alkaline Phosphatase 73 44 - 121 IU/L   AST 11 0 - 40 IU/L   ALT 12 0 - 32 IU/L  Lipid panel   Collection Time: 07/29/24 11:54 AM  Result Value Ref Range   Cholesterol, Total 142 100 - 199 mg/dL   Triglycerides 825 (H) 0 - 149 mg/dL   HDL 44 >60 mg/dL   VLDL Cholesterol Cal 29 5 - 40 mg/dL   LDL Chol Calc (NIH) 69 0 - 99 mg/dL   Chol/HDL Ratio 3.2 0.0 - 4.4 ratio       Pertinent labs & imaging results that were available during my care of the patient were reviewed by me and considered in my medical decision making.  Assessment & Plan:  Jyoti was seen today for foot injury.  Diagnoses and all orders for this visit:  Right foot injury, initial encounter  Right foot pain -     DG Foot Complete Right     Assessment and Plan Camelle is a 38 year old Caucasian female seen today for right foot injury, no acute distress  Assessment & Plan Right foot pain Acute pain in the second and third metatarsal area with possible stress fracture. Osteoporosis may increase fracture risk. - Order x-ray of the right foot. - Provide surgical boot for immobilization. - Advise use of ice and elevation. - Instruct to avoid weight-bearing activities and delegate tasks. - Advise wearing flat shoes and avoiding heels. - Follow up with x-ray results.  Osteoporosis Chronic osteoporosis managed with yearly infusions.  Polycystic ovary syndrome (PCOS) with irregular menstrual cycles PCOS with irregular cycles managed with norethindrone. Recent cycle started 68 days ago, with variability in cycle length.  Endometriosis Managed with progesterone medication.      Continue all other maintenance medications.  Follow up plan: Return if symptoms worsen or fail to  improve.   Continue healthy lifestyle choices, including diet (rich in fruits, vegetables, and lean proteins, and low in salt and simple carbohydrates) and exercise (at least 30 minutes of moderate physical activity daily).  Educational handout given for    Foot Pain Many things can cause foot pain. Common causes include injuries to the foot. The injuries include sprains or broken bones, or injuries that affect the nerves in  the feet. Other causes of foot pain include arthritis, blisters, and bunions. To know what causes your foot pain, your health care provider will take a detailed history of your symptoms. They will also do a physical exam as well as imaging tests, such as X-ray or MRI. Follow these instructions at home: Managing pain, stiffness, and swelling  If told, put ice on the painful area. Put ice in a plastic bag. Place a towel between your skin and the bag. Leave the ice on for 20 minutes, 2-3 times a day. If your skin turns bright red, remove the ice right away to prevent skin damage. The risk of damage is higher if you cannot feel pain, heat, or cold. Activity Do not stand or walk for long periods. Do stretches to relieve foot pain and stiffness as told by your provider. Do not lift anything that is heavier than 10 lb (4.5 kg), or the limit that you are told, until your provider says that it is safe. Lifting a lot of weight can put added pressure on your feet. Return to your normal activities as told by your provider. Ask your provider what activities are safe for you. Lifestyle Wear comfortable, supportive shoes that fit you well. Do not wear high heels. Keep your feet clean and dry. General instructions Take over-the-counter and prescription medicines only as told by your provider. Rub your foot gently. Pay attention to any changes in your symptoms. Let your provider know if symptoms become worse. Keep all follow-up visits. Your provider will want to monitor your  progress. Contact a health care provider if: Your pain does not get better after a few days of treatment at home. Your pain gets worse. You cannot stand on your foot. Your foot or toes are swollen. Your foot is numb or tingling. Get help right away if: Your foot or toes turn white or blue. You have warmth and redness along your foot. This information is not intended to replace advice given to you by your health care provider. Make sure you discuss any questions you have with your health care provider. Document Revised: 11/27/2022 Document Reviewed: 08/05/2022 Elsevier Patient Education  2024 Else[employer] Inc.    The above assessment and management plan was discussed with the patient. The patient verbalized understanding of and has agreed to the management plan. Patient is aware to call the clinic if they develop any new symptoms or if symptoms persist or worsen. Patient is aware when to return to the clinic for a follow-up visit. Patient educated on when it is appropriate to go to the emergency department.   Torion Hulgan [place] Thompson, DNP [facility] [address] [place], [place] [address] [phone]

## 2024-09-01 NOTE — Telephone Encounter (Signed)
 noted

## 2024-09-02 LAB — DRUG TOX MONITOR 1 W/CONF, ORAL FLD
Amphetamines: NEGATIVE ng/mL (ref <10)
Barbiturates: NEGATIVE ng/mL (ref <10)
Benzodiazepines: NEGATIVE ng/mL (ref <0.50)
Buprenorphine: NEGATIVE ng/mL (ref <0.10)
Cocaine: NEGATIVE ng/mL (ref <5.0)
Codeine: NEGATIVE ng/mL (ref <2.5)
Dihydrocodeine: NEGATIVE ng/mL (ref <2.5)
Fentanyl: NEGATIVE ng/mL (ref <0.10)
Heroin Metabolite: NEGATIVE ng/mL (ref <1.0)
Hydrocodone: NEGATIVE ng/mL (ref <2.5)
Hydromorphone: NEGATIVE ng/mL (ref <2.5)
MARIJUANA: NEGATIVE ng/mL (ref <2.5)
MDMA: NEGATIVE ng/mL (ref <10)
Meprobamate: NEGATIVE ng/mL (ref <2.5)
Methadone: NEGATIVE ng/mL (ref <5.0)
Morphine: NEGATIVE ng/mL (ref <2.5)
Nicotine Metabolite: NEGATIVE ng/mL (ref <5.0)
Norhydrocodone: NEGATIVE ng/mL (ref <2.5)
Noroxycodone: 10.6 ng/mL — ABNORMAL HIGH (ref <2.5)
Opiates: POSITIVE ng/mL — AB (ref <2.5)
Oxycodone: 119.4 ng/mL — ABNORMAL HIGH (ref <2.5)
Oxymorphone: NEGATIVE ng/mL (ref <2.5)
Phencyclidine: NEGATIVE ng/mL (ref <10)
Tapentadol: NEGATIVE ng/mL (ref <5.0)
Tramadol: NEGATIVE ng/mL (ref <5.0)
Zolpidem: NEGATIVE ng/mL (ref <5.0)

## 2024-09-02 LAB — DRUG TOX ALC METAB W/CON, ORAL FLD: Alcohol Metabolite: NEGATIVE ng/mL (ref <25)

## 2024-09-14 ENCOUNTER — Ambulatory Visit

## 2024-09-15 ENCOUNTER — Other Ambulatory Visit: Payer: Self-pay

## 2024-09-15 NOTE — Progress Notes (Signed)
 Specialty Pharmacy Refill Coordination Note  Autumn Davis is a 38 y.o. female contacted today regarding refills of specialty medication(s) Tezepelumab -ekko (TEZSPIRE )   Patient requested Delivery   Delivery date: 09/23/24   Verified address: [address] [place], [address]   Medication will be filled on: 09/22/24

## 2024-09-16 ENCOUNTER — Other Ambulatory Visit: Payer: Self-pay | Admitting: Orthopaedic Surgery

## 2024-09-16 DIAGNOSIS — S92344A Nondisplaced fracture of fourth metatarsal bone, right foot, initial encounter for closed fracture: Secondary | ICD-10-CM | POA: Diagnosis not present

## 2024-09-19 ENCOUNTER — Ambulatory Visit
Admission: RE | Admit: 2024-09-19 | Discharge: 2024-09-19 | Disposition: A | Source: Ambulatory Visit | Attending: Orthopaedic Surgery

## 2024-09-19 DIAGNOSIS — S92344A Nondisplaced fracture of fourth metatarsal bone, right foot, initial encounter for closed fracture: Secondary | ICD-10-CM

## 2024-09-22 ENCOUNTER — Other Ambulatory Visit: Payer: Self-pay

## 2024-09-26 ENCOUNTER — Other Ambulatory Visit: Payer: Self-pay

## 2024-09-27 ENCOUNTER — Other Ambulatory Visit: Payer: Self-pay

## 2024-09-28 ENCOUNTER — Other Ambulatory Visit: Payer: Self-pay

## 2024-09-28 NOTE — Progress Notes (Signed)
 Specialty Pharmacy Ongoing Clinical Assessment Note  Autumn Davis is a 38 y.o. female who is being followed by the specialty pharmacy service for RxSp Asthma/COPD   Patient's specialty medication(s) reviewed today: Tezepelumab -ekko (TEZSPIRE )   Missed doses in the last 4 weeks: 0   Patient/Caregiver did not have any additional questions or concerns.   Therapeutic benefit summary: Patient is achieving benefit   Adverse events/side effects summary: No adverse events/side effects   Patient's therapy is appropriate to: Continue    Goals Addressed             This Visit's Progress    Minimize recurrence of flares   On track    Patient is on track. Patient will maintain adherence. Patient states that she is very well-controlled. She has not had to use any of her rescue inhalers (maybe twice per month) and has been able to stop her daily inhaler.         Follow up: 12 months  [facility]

## 2024-10-05 ENCOUNTER — Ambulatory Visit: Payer: Self-pay

## 2024-10-05 DIAGNOSIS — Z23 Encounter for immunization: Secondary | ICD-10-CM

## 2024-10-10 ENCOUNTER — Other Ambulatory Visit: Payer: Self-pay | Admitting: Nurse Practitioner

## 2024-10-18 ENCOUNTER — Other Ambulatory Visit: Payer: Self-pay

## 2024-10-18 ENCOUNTER — Other Ambulatory Visit (HOSPITAL_COMMUNITY): Payer: Self-pay

## 2024-10-18 ENCOUNTER — Ambulatory Visit: Admitting: Allergy and Immunology

## 2024-10-18 ENCOUNTER — Encounter: Payer: Self-pay | Admitting: Allergy and Immunology

## 2024-10-18 VITALS — BP 108/64 | HR 98 | Temp 98.2°F | Ht 67.5 in | Wt 191.6 lb

## 2024-10-18 DIAGNOSIS — J383 Other diseases of vocal cords: Secondary | ICD-10-CM | POA: Diagnosis not present

## 2024-10-18 DIAGNOSIS — J4550 Severe persistent asthma, uncomplicated: Secondary | ICD-10-CM

## 2024-10-18 DIAGNOSIS — K219 Gastro-esophageal reflux disease without esophagitis: Secondary | ICD-10-CM

## 2024-10-18 DIAGNOSIS — J3089 Other allergic rhinitis: Secondary | ICD-10-CM

## 2024-10-18 NOTE — Progress Notes (Unsigned)
 [place] [facility] [place] [facility] [place] - [name] [facility] [place]   Follow-up Note  Referring Provider: Gladis Mustard, * Primary Provider: Gladis Mustard, FNP Date of Office Visit: 10/18/2024  Subjective:   Autumn Davis (DOB: Mar 28, 1986) is a 38 y.o. 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  I last saw her in this clinic 03 May 2024.  She continues to use tezepelumab  injections as her preventative controller agent for her respiratory tract disease and these have worked great.  She has been able to taper off her Dulera.  She rarely uses any nasal steroid.  She has not required a systemic steroid or antibiotic for any type of airway issue.  She does continue to use montelukast  on a daily basis along with some antihistamine.  Rarely does she use a short acting bronchodilator and she can exercise without any problem.  She has lost approximately 50 pounds of weight since her last seen in this clinic while using GLP-1 agonist.  Her reflux is okay at this point.  She is using omeprazole  and famotidine .  Losing weight has helped.  Using Wegovy  actually made her reflux a little more active initially but that has since calmed down.  She will be visiting with her rheumatologist in January 2026 regarding her rheumatoid arthritis.  Currently she is just using prednisone  5 mg a day as an immunosuppressive agent.  She had recent fracture of her foot.  She is on Reclast  for osteoporosis.  Allergies as of 10/18/2024       Reactions   Alfuzosin    Possible allergic reaction in 2024; does fine with Flomax  (Tamsulosin )   Ciprofloxacin  Other (See Comments)   Muscle weakness and numbness Other reaction(s): Not available Muscle weakness and numbness Other  reaction(s): Not available Other Reaction(s): Other (See Comments) Muscle weakness and numbness Other reaction(s): Not available    Muscle weakness and numbness  Other reaction(s): Not available  Muscle weakness and numbness Other reaction(s): Not available   Compazine Other (See Comments)   hallucinations   Prochlorperazine Other (See Comments), Rash   Pt states it makes her feel like things are crawling on her Other reaction(s): Not available   Zofran  [ondansetron ] Other (See Comments)   Reports reaction; can only tolerate phenergan  tremors        Medication List    acetaZOLAMIDE  250 MG tablet Commonly known as: DIAMOX  TAKE 1 TABLET EVERY MORNING, TAKE 1 TABLET AT 2PM, AND TAKE 2 TABLETSAT BEDTIME   albuterol  108 (90 Base) MCG/ACT inhaler Commonly known as: Ventolin  HFA Inhale 2 puffs into the lungs every 4 (four) hours as needed for wheezing or shortness of breath.   amitriptyline  75 MG tablet Commonly known as: ELAVIL  Take 1 tablet (75 mg total) by mouth at bedtime.   atorvastatin  40 MG tablet Commonly known as: LIPITOR Take 1 tablet (40 mg total) by mouth daily.   baclofen  20 MG tablet Commonly known as: LIORESAL  TAKE ONE (1) TABLET BY MOUTH FOUR (4) TIMES DAILY   budesonide  32 MCG/ACT nasal spray Commonly known as: RHINOCORT  AQUA Place 2 sprays into both nostrils every morning.   CALCIUM  1200+D3 PO Take 1 tablet by mouth daily.   cetirizine  10 MG tablet Commonly known as: ZYRTEC  TAKE 1 TABLET BY MOUTH TWICE DAILY AS NEEDED FOR ALLERGIES  cromolyn 4 % ophthalmic solution Commonly known as: OPTICROM SMARTSIG:In Eye(s)   cyanocobalamin  1000 MCG/ML injection Commonly known as: VITAMIN B12 INJECT 1ML IM EVERY 30 DAYS   cycloSPORINE 0.05 % ophthalmic emulsion Commonly known as: RESTASIS Place 1 drop into both eyes 2 (two) times daily.   diclofenac  75 MG EC tablet Commonly known as: VOLTAREN  TAKE ONE TABLET BY MOUTH TWICE DAILY   EPINEPHrine  0.3  mg/0.3 mL Soaj injection Commonly known as: EPI-PEN INJECT 0.3MG  INTO THE MUSCLE AS NEEDED FOR ANAPHYLAXIS   EPINEPHRINE  BASE IN   famotidine  40 MG tablet Commonly known as: PEPCID  Take 1 tablet (40 mg total) by mouth at bedtime.   furosemide  20 MG tablet Commonly known as: LASIX  Take 1 tablet (20 mg total) by mouth daily.   gabapentin  600 MG tablet Commonly known as: NEURONTIN  TAKE 1 TABLET BY MOUTH 3 TIMES DAILY & TAKE 2 TABLETS AT BEDTIME   ipratropium-albuterol  0.5-2.5 (3) MG/3ML Soln Commonly known as: DUONEB Take 3 mLs by nebulization every 4 (four) hours as needed.   L-Methylfolate 15 MG Tabs Take 1 tablet (15 mg total) by mouth daily.   MAGNESIUM GLYCINATE PO Take 2 tablets by mouth daily.   metoprolol  succinate 25 MG 24 hr tablet Commonly known as: TOPROL -XL Take 1 tablet (25 mg total) by mouth daily.   mometasone -formoterol  200-5 MCG/ACT Aero Commonly known as: DULERA Inhale 2 puffs into the lungs in the morning and at bedtime. 2 inhalations 1-2 times per day w/ spacer   montelukast  10 MG tablet Commonly known as: SINGULAIR  Take 1 tablet (10 mg total) by mouth at bedtime.   mupirocin  ointment 2 % Commonly known as: BACTROBAN  APPLY A SMALL AMOUNT TO THE AFFECTED AREA BY TOPICAL ROUTE 3 TIMES PER DAY   norethindrone 0.35 MG tablet Commonly known as: MICRONOR Take 1 tablet by mouth daily.   nystatin  100000 UNIT/ML suspension Commonly known as: MYCOSTATIN  Use as directed 5 mLs (500,000 Units total) in the mouth or throat 4 (four) times daily.   nystatin  cream Commonly known as: MYCOSTATIN  Apply 1 application topically 2 (two) times daily.   nystatin -triamcinolone  ointment Commonly known as: MYCOLOG Apply 1 application topically 2 (two) times daily.   Omega 3 1000 MG Caps Take 1 capsule by mouth daily.   omeprazole  40 MG capsule Commonly known as: PRILOSEC Take 1 capsule (40 mg total) by mouth 2 (two) times daily.   oxyCODONE -acetaminophen   7.5-325 MG tablet Commonly known as: Percocet Take 1 tablet by mouth 5 (five) times daily as needed for moderate pain (pain score 4-6). No More Than 5 a day. Do Not Fill Before 10/05/2024   predniSONE  5 MG tablet Commonly known as: DELTASONE  Take 1 tablet (5 mg total) by mouth every morning.   PROBIOTIC-10 PO Take by mouth.   promethazine  12.5 MG tablet Commonly known as: PHENERGAN  Take 1 tablet (12.5 mg total) by mouth every 8 (eight) hours as needed.   sulfamethoxazole -trimethoprim  800-160 MG tablet Commonly known as: Bactrim  DS Take 1 tablet by mouth 2 (two) times daily.   tamsulosin  0.4 MG Caps capsule Commonly known as: FLOMAX  TAKE 1 CAPSULE BY MOUTH EVERY MORNING AFTER BREAKFAST   Tezspire  210 MG/1. Soaj Generic drug: Tezepelumab -ekko Inject 210 mg into the skin every 28 (twenty-eight) days.   VITAMIN C ADULT GUMMIES PO Take 2 each by mouth daily.   Vitamin D  (Ergocalciferol ) 1.25 MG (50000 UNIT) Caps capsule Commonly known as: DRISDOL  TAKE 1 CAPSULE BY MOUTH EVERY 7 DAYS   vitamin  E 180 MG (400 UNITS) capsule Take 1,000 Units by mouth daily.   Wegovy  1 MG/0.5ML Soaj SQ injection Generic drug: semaglutide -weight management Inject 1 mg into the skin once a week.   ZINC PO Take 1 tablet by mouth daily.    Past Medical History:  Diagnosis Date   Allergy    Arthritis    HANDS,HIPS,KNEES   Asthma    Bronchitis, chronic/intermittent 01/22/2012   Depression 01/22/2012   GERD (gastroesophageal reflux disease)    Hyperlipidemia    IBS (irritable bowel syndrome) 01/22/2012   Lupus    Neuromuscular disorder (HCC)    Osteoporosis    Seizures (HCC)    SOB (shortness of breath)    Thyroid  disease     Past Surgical History:  Procedure Laterality Date   15 HOUR PH STUDY N/A 10/29/2015   Procedure: 24 HOUR PH STUDY;  Surgeon: Lupita FORBES Commander, MD;  Location: [facility];  Service: Endoscopy;  Laterality: N/A;   COLONOSCOPY     ESOPHAGEAL MANOMETRY N/A  10/29/2015   Procedure: ESOPHAGEAL MANOMETRY (EM);  Surgeon: Lupita FORBES Commander, MD;  Location: [facility];  Service: Endoscopy;  Laterality: N/A;   KNEE SURGERY  2002/2003   bil   RECTAL SURGERY  correction of prolapse   2010    Review of systems negative except as noted in HPI / PMHx or noted below:  Review of Systems  Constitutional: Negative.   HENT: Negative.    Eyes: Negative.   Respiratory: Negative.    Cardiovascular: Negative.   Gastrointestinal: Negative.   Genitourinary: Negative.   Musculoskeletal: Negative.   Skin: Negative.   Neurological: Negative.   Endo/Heme/Allergies: Negative.   Psychiatric/Behavioral: Negative.       Objective:   Vitals:   10/18/24 1530  BP: 108/64  Pulse: 98  Temp: 98.2 F (36.8 C)  SpO2: 99%   Height: 5' 7.5 (171.5 cm)  Weight: 191 lb 9.6 oz (86.9 kg)   Physical Exam Constitutional:      Appearance: She is not diaphoretic.  HENT:     Head: Normocephalic.     Right Ear: Tympanic membrane, ear canal and external ear normal.     Left Ear: Tympanic membrane, ear canal and external ear normal.     Nose: Nose normal. No mucosal edema or rhinorrhea.     Mouth/Throat:     Pharynx: Uvula midline. No oropharyngeal exudate.  Eyes:     Conjunctiva/sclera: Conjunctivae normal.  Neck:     Thyroid : No thyromegaly.     Trachea: Trachea normal. No tracheal tenderness or tracheal deviation.  Cardiovascular:     Rate and Rhythm: Normal rate and regular rhythm.     Heart sounds: Normal heart sounds, S1 normal and S2 normal. No murmur heard. Pulmonary:     Effort: No respiratory distress.     Breath sounds: Normal breath sounds. No stridor. No wheezing or rales.  Lymphadenopathy:     Head:     Right side of head: No tonsillar adenopathy.     Left side of head: No tonsillar adenopathy.     Cervical: No cervical adenopathy.  Skin:    Findings: No erythema or rash.     Nails: There is no clubbing.  Neurological:     Mental Status: She  is alert.     Diagnostics: Spirometry was performed and demonstrated an FEV1 of 3.03 at 89 % of predicted.  Assessment and Plan:   1. Asthma, severe persistent, well-controlled (HCC)   2.  Perennial allergic rhinitis   3. LPRD (laryngopharyngeal reflux disease)   4. Vocal cord dysfunction     1.  Allergen avoidance measures  - dust mite, Alfuzosin, Myrbetriq   2.  Continue to treat and prevent inflammation:  A.  Dulera 200 - 2 inhalations 1-2 times per day w/ spacer  B.  Montelukast  10 mg -1 tablet 1 time per day C.  OTC Rhinocort   - 1-2 sprays each nostril 3-7 times per week D.  Tezepelumab  injection every 4 weeks  3.  Treat and prevent reflux/LPR:  A.  Minimize all forms of caffeine consumption B.  Omeprazole  40 mg -1 tablet twice a day C.  Famotidine  40 mg -1 tablet in evening D.  Replace throat clearing with swallowing maneuver  4.  If needed:  A. Albuterol  HFA - 2 inhalations or nebulization every 4-6 hours B. Antihistamine - cetirizine  10 mg - 1-2 tabs 1-2 times per day  C. Epi-pen  5. Return to clinic in 6 months or earlier if problem  6. Influenza = Tamiflu . Covid = Paxlovid  Maysie appears to be doing quite well while using tezepelumab  as her controller agent and she has been able to taper off her Dulera and her Rhinocort  and relies on addition of montelukast  to control both her upper and lower airway disease.  She is going to remain on this plan as well as addressing her issue with reflux as noted above and we will see her back in this clinic in 6 months or earlier if there is a problem.  Camellia Denis, MD Allergy / Immunology [facility]

## 2024-10-18 NOTE — Patient Instructions (Addendum)
  1.  Allergen avoidance measures  - dust mite, Alfuzosin, Myrbetriq   2.  Continue to treat and prevent inflammation:  A.  Dulera 200 - 2 inhalations 1-2 times per day w/ spacer  B.  Montelukast  10 mg -1 tablet 1 time per day C.  OTC Rhinocort   - 1-2 sprays each nostril 3-7 times per week D.  Tezepelumab  injection every 4 weeks  3.  Treat and prevent reflux/LPR:  A.  Minimize all forms of caffeine consumption B.  Omeprazole  40 mg -1 tablet twice a day C.  Famotidine  40 mg -1 tablet in evening D.  Replace throat clearing with swallowing maneuver  4.  If needed:  A. Albuterol  HFA - 2 inhalations or nebulization every 4-6 hours B. Antihistamine - cetirizine  10 mg - 1-2 tabs 1-2 times per day  C. Epi-pen  5. Return to clinic in 6 months or earlier if problem  6. Influenza = Tamiflu . Covid = Paxlovid

## 2024-10-19 ENCOUNTER — Encounter: Payer: Self-pay | Admitting: Allergy and Immunology

## 2024-10-19 MED ORDER — MOMETASONE FURO-FORMOTEROL FUM 200-5 MCG/ACT IN AERO: 2.0000 | INHALATION_SPRAY | Freq: Two times a day (BID) | RESPIRATORY_TRACT | 1 refills | Status: AC

## 2024-10-19 MED ORDER — CETIRIZINE HCL 10 MG PO TABS: 10.0000 mg | ORAL_TABLET | Freq: Every day | ORAL | 1 refills | Status: AC

## 2024-10-19 MED ORDER — BUDESONIDE 32 MCG/ACT NA SUSP: 2.0000 | Freq: Every morning | NASAL | 1 refills | Status: AC

## 2024-10-19 MED ORDER — ALBUTEROL SULFATE HFA 108 (90 BASE) MCG/ACT IN AERS: 2.0000 | INHALATION_SPRAY | RESPIRATORY_TRACT | 1 refills | Status: AC | PRN

## 2024-10-19 MED ORDER — FAMOTIDINE 40 MG PO TABS: 40.0000 mg | ORAL_TABLET | Freq: Every evening | ORAL | 1 refills | Status: AC

## 2024-10-19 MED ORDER — EPINEPHRINE 0.3 MG/0.3ML IJ SOAJ: 0.3000 mg | INTRAMUSCULAR | 2 refills | Status: AC | PRN

## 2024-10-19 MED ORDER — OMEPRAZOLE 40 MG PO CPDR: 40.0000 mg | DELAYED_RELEASE_CAPSULE | Freq: Two times a day (BID) | ORAL | 1 refills | Status: AC

## 2024-10-19 MED ORDER — MONTELUKAST SODIUM 10 MG PO TABS: 10.0000 mg | ORAL_TABLET | Freq: Every evening | ORAL | 1 refills | Status: AC

## 2024-10-20 ENCOUNTER — Other Ambulatory Visit: Payer: Self-pay

## 2024-10-20 NOTE — Progress Notes (Signed)
 Specialty Pharmacy Refill Coordination Note  Autumn Davis is a 38 y.o. female contacted today regarding refills of specialty medication(s) Tezepelumab -ekko (TEZSPIRE )   Patient requested Delivery   Delivery date: 10/21/24   Verified address: [address], [place], [address]   Medication will be filled on: 10/20/24

## 2024-10-27 ENCOUNTER — Other Ambulatory Visit (HOSPITAL_COMMUNITY): Payer: Self-pay

## 2024-11-07 ENCOUNTER — Encounter: Attending: Registered Nurse | Admitting: Registered Nurse

## 2024-11-07 DIAGNOSIS — G4730 Sleep apnea, unspecified: Secondary | ICD-10-CM | POA: Diagnosis not present

## 2024-11-07 DIAGNOSIS — R5383 Other fatigue: Secondary | ICD-10-CM | POA: Diagnosis not present

## 2024-11-07 DIAGNOSIS — R0683 Snoring: Secondary | ICD-10-CM | POA: Diagnosis not present

## 2024-11-08 ENCOUNTER — Other Ambulatory Visit: Payer: Self-pay

## 2024-11-11 ENCOUNTER — Other Ambulatory Visit: Payer: Self-pay

## 2024-11-11 ENCOUNTER — Other Ambulatory Visit (HOSPITAL_COMMUNITY): Payer: Self-pay

## 2024-11-11 NOTE — Progress Notes (Signed)
 Specialty Pharmacy Refill Coordination Note  Autumn Davis is a 38 y.o. female contacted today regarding refills of specialty medication(s) Tezepelumab -ekko (TEZSPIRE )   Patient requested Delivery   Delivery date: 11/16/24   Verified address: [address] [place], [address]   Medication will be filled on: 11/15/24

## 2024-11-15 ENCOUNTER — Other Ambulatory Visit: Payer: Self-pay

## 2024-11-16 NOTE — Progress Notes (Signed)
 "  Subjective:    Patient ID: Autumn Davis, female    DOB: [birth year], 38 y.o.   MRN: [id number]  HPI: Autumn Davis is a 37 y.o. female who returns for follow up appointment for chronic pain and medication refill. She states her pain is located in her mid- back, bilateral hips L>R,, bilateral lower extremities and bilateral feet with tingling and burning. She rates her pain 8. Her current exercise regime is walking and performing stretching exercises.  Autumn Davis  equivalent is 50.63 MME.   Last Oral Swab was Performed 08/29/2024, it was consistent.      Pain Inventory Average Pain 6 Pain Right Now 8 My pain is sharp, stabbing, aching  In the last 24 hours, has pain interfered with the following? General activity 8 Relation with others 6 Enjoyment of life 6 What TIME of day is your pain at its worst? evening and night Sleep (in general) Poor  Pain is worse with: walking, bending, sitting, inactivity, standing, and some activites Pain improves with: rest, heat/ice, therapy/exercise, pacing activities, medication, and TENS Relief from Meds: 8  Family History  Problem Relation Age of Onset   Thyroid  disease Mother    Colon polyps Father    Lung cancer Maternal Grandmother    Cancer Paternal Grandmother        colon/pancreatiec/lymphoma   Irritable bowel syndrome Brother    AAA (abdominal aortic aneurysm) Neg Hx    Social History   Socioeconomic History   Marital status: Legally Separated    Spouse name: Not on file   Number of children: 1   Years of education: Not on file   Highest education level: Not on file  Occupational History   Occupation: disabled    Employer: [employer]  Tobacco Use   Smoking status: Never   Smokeless tobacco: Never  Vaping Use   Vaping status: Never Used  Substance and Sexual Activity   Alcohol  use: No    Alcohol /week: 0.0 standard drinks of alcohol    Drug use: No   Sexual activity: Not Currently   Other Topics Concern   Not on file  Social History Narrative   She lives alone with her 89 year old son - right now - they are living with her parents right now   Social Drivers of Health   Tobacco Use: Unknown (11/07/2024)   Received from [facility]   Patient History    Smoking Tobacco Use: Never    Smokeless Tobacco Use: Unknown    Passive Exposure: Not on file  Financial Resource Strain: Low Risk (11/07/2024)   Received from [facility]   Overall Financial Resource Strain (CARDIA)    How hard is it for you to pay for the very basics like food, housing, medical care, and heating?: Not hard at all  Food Insecurity: No Food Insecurity (11/07/2024)   Received [facility]   Epic    Within the past 12 months, you worried that your food would run out before you got the money to buy more.: Never true    Within the past 12 months, the food you bought just didn't last and you didn't have money to get more.: Never true  Transportation Needs: No Transportation Needs (11/07/2024)   Received from [facility]    In the past 12 months, has lack of transportation kept you from medical appointments or from getting medications?: No    In the past 12 months, has  lack of transportation kept you from meetings, work, or from getting things needed for daily living?: No  Physical Activity: Insufficiently Active (05/05/2023)   Exercise Vital Sign    Days of Exercise per Week: 2 days    Minutes of Exercise per Session: 30 min  Stress: No Stress Concern Present (05/05/2023)   [employer] of Occupational Health - Occupational Stress Questionnaire    Feeling of Stress : Not at all  Social Connections: Moderately Isolated (05/05/2023)   Social Connection and Isolation Panel    Frequency of Communication with Friends and Family: More than three times a week    Frequency of Social Gatherings with Friends and Family: More than three times a week    Attends Religious Services: More  than 4 times per year    Active Member of Clubs or Organizations: No    Attends Banker Meetings: Never    Marital Status: Separated  Depression (PHQ2-9): Low Risk (07/29/2024)   Depression (PHQ2-9)    PHQ-2 Score: 4  Alcohol  Screen: Low Risk (05/05/2023)   Alcohol  Screen    Last Alcohol  Screening Score (AUDIT): 0  Housing: Low Risk (11/07/2024)   Received from [facility]    In the last 12 months, was there a time when you were not able to pay the mortgage or rent on time?: No    In the past 12 months, how many times have you moved where you were living?: 0    At any time in the past 12 months, were you homeless or living in a shelter (including now)?: No  Utilities: Not At Risk (11/07/2024)   Received from [facility]    In the past 12 months has the electric, gas, oil, or water company threatened to shut off services in your home?: No  Health Literacy: Not on file   Past Surgical History:  Procedure Laterality Date   26 HOUR PH STUDY N/A 10/29/2015   Procedure: 24 HOUR PH STUDY;  Surgeon: Lupita FORBES Commander, MD;  Location: [facility];  Service: Endoscopy;  Laterality: N/A;   COLONOSCOPY     ESOPHAGEAL MANOMETRY N/A 10/29/2015   Procedure: ESOPHAGEAL MANOMETRY (EM);  Surgeon: Lupita FORBES Commander, MD;  [facility];  Service: Endoscopy;  Laterality: N/A;   KNEE SURGERY  2002/2003   bil   RECTAL SURGERY  correction of prolapse   2010   Past Surgical History:  Procedure Laterality Date   60 HOUR PH STUDY N/A 10/29/2015   Procedure: 24 HOUR PH STUDY;  Surgeon: Lupita FORBES Commander, MD;  [facility]: [facility];  Service: Endoscopy;  Laterality: N/A;   COLONOSCOPY     ESOPHAGEAL MANOMETRY N/A 10/29/2015   Procedure: ESOPHAGEAL MANOMETRY (EM);  Surgeon: Lupita FORBES Commander, MD;  [facility];  Service: Endoscopy;  Laterality: N/A;   KNEE SURGERY  2002/2003   bil   RECTAL SURGERY  correction of prolapse   2010   Past Medical History:  Diagnosis  Date   Allergy    Arthritis    HANDS,HIPS,KNEES   Asthma    Bronchitis, chronic/intermittent 01/22/2012   Depression 01/22/2012   GERD (gastroesophageal reflux disease)    Hyperlipidemia    IBS (irritable bowel syndrome) 01/22/2012   Lupus    Neuromuscular disorder (HCC)    Osteoporosis    Seizures (HCC)    SOB (shortness of breath)    Thyroid  disease    BP 119/79   Pulse 86  Ht 5' 7.5 (1.715 m)   Wt 187 lb (84.8 kg)   SpO2 97%   BMI 28.86 kg/m   Opioid Risk Score:   Fall Risk Score:  `1  Depression screen [facility] 2/9     11/18/2024    2:46 PM 07/29/2024   11:49 AM 06/27/2024   10:50 AM 05/06/2024    1:23 PM 04/25/2024   12:33 PM 12/21/2023   10:21 AM 09/11/2023   10:34 AM  Depression screen PHQ 2/9  Decreased Interest 0 1 0 0 1 2 0  Down, Depressed, Hopeless 0 0 0 0 0 0 0  PHQ - 2 Score 0 1 0 0 1 2 0  Altered sleeping  2    2   Tired, decreased energy  1    2   Change in appetite  0    0   Feeling bad or failure about yourself   0    0   Trouble concentrating  0    0   Moving slowly or fidgety/restless  0    0   Suicidal thoughts  0    0   PHQ-9 Score  4     6    Difficult doing work/chores  Somewhat difficult    Somewhat difficult      Data saved with a previous flowsheet row definition    Review of Systems  Constitutional: Negative.   HENT: Negative.    Eyes: Negative.   Respiratory: Negative.    Cardiovascular: Negative.   Gastrointestinal: Negative.   Endocrine: Negative.   Genitourinary: Negative.   Musculoskeletal:  Positive for back pain and myalgias.  Skin: Negative.   Allergic/Immunologic: Negative.   Neurological: Negative.   Hematological: Negative.   Psychiatric/Behavioral: Negative.    All other systems reviewed and are negative.      Objective:   Physical Exam Vitals and nursing note reviewed.  Constitutional:      Appearance: Normal appearance.  Cardiovascular:     Rate and Rhythm: Normal rate and regular rhythm.     Pulses: Normal  pulses.     Heart sounds: Normal heart sounds.  Pulmonary:     Effort: Pulmonary effort is normal.     Breath sounds: Normal breath sounds.  Musculoskeletal:     Comments: Normal Muscle Bulk and Muscle Testing Reveals:  Upper Extremities: Full ROM and Muscle Strength 5/5  Thoracic Paraspinal Tenderness: T-4-T-6 Bilateral Greater Trochanter Tenderness : L>R  Lower Extremities: Full ROM and Muscle Strength 5/5 Arises from Table slowly Narrow Based  Gait     Skin:    General: Skin is warm and dry.  Neurological:     Mental Status: She is alert and oriented to person, place, and time.  Psychiatric:        Mood and Affect: Mood normal.        Behavior: Behavior normal.          Assessment & Plan:  1. Chronic seizure disorder: No seizure's. Continue current medication regimen with  Gabapentin . Neurology Following. 11/18/2024. 2. Chronic muscle spasms, weakness with associated pain disorder: Continue current medication regimen with Baclofen . Continue with Exercise regime. 11/18/2024. 3. Chronic dysphagia: No complaints today. Continue to monitor. GI Following. 11/18/2024 4. Anxiety with depression : No complaints today. Stable. Continue current medication regimen with  Elavil . 11/18/2024. 5. Fibromyalgia/ Rib Pain/Chronic Pain: Continue Diclofenac : Continue with exercise and heat Therapy. 11/18/2024. Refilled:  oxyCODONE  7.5/325mg  one tablet 5 times  daily as needed  #135.  Second script sent for the following month. Continue with slow weaning.   We will continue the opioid monitoring program, this consists of regular clinic visits, examinations, urine drug screen, pill counts as well as use of [place]  Controlled Substance Reporting system. A 12 month History has been reviewed on the   Controlled Substance Reporting System 11/18/2024.  6. Peripheral Neuropathy: Continue current medication regimen with  Gabapentin : 11/18/2024. 7. Polyarthralgia: Continue HEP as  Tolerated. Continue to Monitor.  11/18/2024  8. Bilateral Chronic Knee Pain: No complaints today. Continue HEP as Tolerated. Continue to Monitor. 11/18/2024 9. Chronic Bilateral Thoracic Back Pain: Continue HEP as tolerated. Continue to monitor. 11/18/2024 10. Chronic Bilateral Hip Pain L>R: DG: X-Ray. Continue to Monitor. Continue to monitor.   F/U in 2 months  "

## 2024-11-18 ENCOUNTER — Encounter: Attending: Registered Nurse | Admitting: Registered Nurse

## 2024-11-18 ENCOUNTER — Encounter: Payer: Self-pay | Admitting: Registered Nurse

## 2024-11-18 VITALS — BP 119/79 | HR 86 | Ht 67.5 in | Wt 187.0 lb

## 2024-11-18 DIAGNOSIS — M546 Pain in thoracic spine: Secondary | ICD-10-CM | POA: Diagnosis not present

## 2024-11-18 DIAGNOSIS — M2550 Pain in unspecified joint: Secondary | ICD-10-CM | POA: Diagnosis not present

## 2024-11-18 DIAGNOSIS — Z5181 Encounter for therapeutic drug level monitoring: Secondary | ICD-10-CM | POA: Diagnosis not present

## 2024-11-18 DIAGNOSIS — G8929 Other chronic pain: Secondary | ICD-10-CM | POA: Diagnosis present

## 2024-11-18 DIAGNOSIS — M7062 Trochanteric bursitis, left hip: Secondary | ICD-10-CM | POA: Diagnosis not present

## 2024-11-18 DIAGNOSIS — G609 Hereditary and idiopathic neuropathy, unspecified: Secondary | ICD-10-CM | POA: Diagnosis not present

## 2024-11-18 DIAGNOSIS — Z79891 Long term (current) use of opiate analgesic: Secondary | ICD-10-CM | POA: Diagnosis not present

## 2024-11-18 DIAGNOSIS — M7061 Trochanteric bursitis, right hip: Secondary | ICD-10-CM | POA: Diagnosis not present

## 2024-11-18 DIAGNOSIS — G894 Chronic pain syndrome: Secondary | ICD-10-CM | POA: Insufficient documentation

## 2024-11-18 DIAGNOSIS — M797 Fibromyalgia: Secondary | ICD-10-CM | POA: Insufficient documentation

## 2024-11-18 MED ORDER — OXYCODONE-ACETAMINOPHEN 7.5-325 MG PO TABS: 1.0000 | ORAL_TABLET | Freq: Every day | ORAL | 0 refills | Status: DC | PRN

## 2024-11-18 MED ORDER — OXYCODONE-ACETAMINOPHEN 7.5-325 MG PO TABS: 1.0000 | ORAL_TABLET | Freq: Every day | ORAL | 0 refills | Status: AC | PRN

## 2024-11-21 ENCOUNTER — Telehealth: Payer: Self-pay

## 2024-11-21 NOTE — Telephone Encounter (Signed)
 Placed a call to Autumn Davis regarding her message. No answer. Left message to return the call.

## 2024-11-21 NOTE — Telephone Encounter (Signed)
 Patient called stating that she has been taking medication as instructed on Friday and her pain level has increased. Please advise.

## 2024-11-23 ENCOUNTER — Telehealth: Payer: Self-pay | Admitting: Registered Nurse

## 2024-11-23 DIAGNOSIS — M25552 Pain in left hip: Secondary | ICD-10-CM

## 2024-11-23 DIAGNOSIS — M2550 Pain in unspecified joint: Secondary | ICD-10-CM

## 2024-11-23 MED ORDER — METHYLPREDNISOLONE 4 MG PO TBPK: ORAL_TABLET | ORAL | 0 refills | Status: AC

## 2024-11-23 NOTE — Telephone Encounter (Signed)
 Returned [employer] phone call

## 2024-11-23 NOTE — Telephone Encounter (Signed)
 Return Autumn Davis call,  She reports increase intensity of pain in Left hip, rib pain and left lower extremity and left leg. Increase intensity of joint  pain.

## 2024-12-06 ENCOUNTER — Other Ambulatory Visit: Payer: Self-pay

## 2024-12-08 ENCOUNTER — Other Ambulatory Visit: Payer: Self-pay | Admitting: Pharmacy Technician

## 2024-12-08 ENCOUNTER — Other Ambulatory Visit: Payer: Self-pay

## 2024-12-08 NOTE — Progress Notes (Signed)
 Specialty Pharmacy Refill Coordination Note  Autumn Davis is a 39 y.o. female contacted today regarding refills of specialty medication(s) Tezepelumab -ekko (TEZSPIRE )   Patient requested Delivery   Delivery date: 12/15/24   Verified address: [address]  [place]   Medication will be filled on: 12/14/24

## 2024-12-14 ENCOUNTER — Other Ambulatory Visit: Payer: Self-pay

## 2024-12-21 ENCOUNTER — Other Ambulatory Visit: Payer: Self-pay | Admitting: Nurse Practitioner

## 2024-12-21 DIAGNOSIS — R350 Frequency of micturition: Secondary | ICD-10-CM

## 2025-01-20 ENCOUNTER — Encounter: Admitting: Registered Nurse

## 2025-02-02 ENCOUNTER — Ambulatory Visit: Payer: Self-pay | Admitting: Nurse Practitioner

## 2025-04-18 ENCOUNTER — Ambulatory Visit: Admitting: Allergy and Immunology
